# Patient Record
Sex: Female | Born: 1940
Health system: Southern US, Community
[De-identification: ages and names within clinical notes are randomized; demographics above are authoritative.]

## PROBLEM LIST (undated history)

## (undated) DIAGNOSIS — K769 Liver disease, unspecified: Secondary | ICD-10-CM

## (undated) DIAGNOSIS — K56609 Unspecified intestinal obstruction, unspecified as to partial versus complete obstruction: Secondary | ICD-10-CM

## (undated) DIAGNOSIS — T4145XA Adverse effect of unspecified anesthetic, initial encounter: Secondary | ICD-10-CM

## (undated) DIAGNOSIS — Z9889 Other specified postprocedural states: Secondary | ICD-10-CM

## (undated) DIAGNOSIS — N200 Calculus of kidney: Secondary | ICD-10-CM

## (undated) DIAGNOSIS — Z9981 Dependence on supplemental oxygen: Secondary | ICD-10-CM

## (undated) DIAGNOSIS — I10 Essential (primary) hypertension: Secondary | ICD-10-CM

## (undated) DIAGNOSIS — R002 Palpitations: Secondary | ICD-10-CM

## (undated) DIAGNOSIS — R51 Headache: Secondary | ICD-10-CM

## (undated) DIAGNOSIS — I459 Conduction disorder, unspecified: Secondary | ICD-10-CM

## (undated) DIAGNOSIS — I219 Acute myocardial infarction, unspecified: Secondary | ICD-10-CM

## (undated) DIAGNOSIS — IMO0002 Reserved for concepts with insufficient information to code with codable children: Secondary | ICD-10-CM

## (undated) DIAGNOSIS — T8859XA Other complications of anesthesia, initial encounter: Secondary | ICD-10-CM

## (undated) DIAGNOSIS — K219 Gastro-esophageal reflux disease without esophagitis: Secondary | ICD-10-CM

## (undated) DIAGNOSIS — Z8719 Personal history of other diseases of the digestive system: Secondary | ICD-10-CM

## (undated) DIAGNOSIS — J189 Pneumonia, unspecified organism: Secondary | ICD-10-CM

## (undated) DIAGNOSIS — J449 Chronic obstructive pulmonary disease, unspecified: Secondary | ICD-10-CM

## (undated) DIAGNOSIS — D126 Benign neoplasm of colon, unspecified: Secondary | ICD-10-CM

## (undated) DIAGNOSIS — Z973 Presence of spectacles and contact lenses: Secondary | ICD-10-CM

## (undated) DIAGNOSIS — M1990 Unspecified osteoarthritis, unspecified site: Secondary | ICD-10-CM

## (undated) DIAGNOSIS — R519 Headache, unspecified: Secondary | ICD-10-CM

## (undated) DIAGNOSIS — F418 Other specified anxiety disorders: Secondary | ICD-10-CM

## (undated) DIAGNOSIS — IMO0001 Reserved for inherently not codable concepts without codable children: Secondary | ICD-10-CM

## (undated) DIAGNOSIS — D649 Anemia, unspecified: Secondary | ICD-10-CM

## (undated) DIAGNOSIS — I82409 Acute embolism and thrombosis of unspecified deep veins of unspecified lower extremity: Secondary | ICD-10-CM

## (undated) DIAGNOSIS — K5790 Diverticulosis of intestine, part unspecified, without perforation or abscess without bleeding: Secondary | ICD-10-CM

## (undated) DIAGNOSIS — K279 Peptic ulcer, site unspecified, unspecified as acute or chronic, without hemorrhage or perforation: Secondary | ICD-10-CM

## (undated) DIAGNOSIS — E785 Hyperlipidemia, unspecified: Secondary | ICD-10-CM

## (undated) DIAGNOSIS — G589 Mononeuropathy, unspecified: Secondary | ICD-10-CM

## (undated) DIAGNOSIS — R112 Nausea with vomiting, unspecified: Secondary | ICD-10-CM

## (undated) HISTORY — PX: SMALL INTESTINE SURGERY: SHX150

## (undated) HISTORY — PX: MULTIPLE TOOTH EXTRACTIONS: SHX2053

## (undated) HISTORY — PX: URETHRAL DILATION: SUR417

## (undated) HISTORY — DX: Chronic obstructive pulmonary disease, unspecified: J44.9

## (undated) HISTORY — DX: Hyperlipidemia, unspecified: E78.5

## (undated) HISTORY — PX: DILATION AND CURETTAGE OF UTERUS: SHX78

## (undated) HISTORY — DX: Diverticulosis of intestine, part unspecified, without perforation or abscess without bleeding: K57.90

## (undated) HISTORY — PX: HERNIA REPAIR: SHX51

## (undated) HISTORY — PX: TUBAL LIGATION: SHX77

## (undated) HISTORY — DX: Benign neoplasm of colon, unspecified: D12.6

---

## 1985-07-23 HISTORY — PX: ABDOMINAL HYSTERECTOMY: SHX81

## 1990-07-23 HISTORY — PX: OOPHORECTOMY: SHX86

## 2010-09-14 ENCOUNTER — Encounter: Payer: Self-pay | Admitting: Family Medicine

## 2010-09-14 ENCOUNTER — Ambulatory Visit (INDEPENDENT_AMBULATORY_CARE_PROVIDER_SITE_OTHER): Payer: Medicare Other | Admitting: Family Medicine

## 2010-09-14 DIAGNOSIS — E785 Hyperlipidemia, unspecified: Secondary | ICD-10-CM | POA: Insufficient documentation

## 2010-09-14 DIAGNOSIS — G4700 Insomnia, unspecified: Secondary | ICD-10-CM | POA: Insufficient documentation

## 2010-09-14 DIAGNOSIS — I10 Essential (primary) hypertension: Secondary | ICD-10-CM

## 2010-09-14 DIAGNOSIS — R0989 Other specified symptoms and signs involving the circulatory and respiratory systems: Secondary | ICD-10-CM | POA: Insufficient documentation

## 2010-09-14 DIAGNOSIS — R002 Palpitations: Secondary | ICD-10-CM | POA: Insufficient documentation

## 2010-09-14 DIAGNOSIS — J449 Chronic obstructive pulmonary disease, unspecified: Secondary | ICD-10-CM | POA: Insufficient documentation

## 2010-09-14 DIAGNOSIS — Z136 Encounter for screening for cardiovascular disorders: Secondary | ICD-10-CM

## 2010-09-14 DIAGNOSIS — Z87891 Personal history of nicotine dependence: Secondary | ICD-10-CM | POA: Insufficient documentation

## 2010-09-15 ENCOUNTER — Telehealth: Payer: Self-pay | Admitting: Family Medicine

## 2010-09-19 NOTE — Assessment & Plan Note (Signed)
Summary: NOV;Palpitations,COPD, tobacco abuse   Vital Signs:  Patient profile:   70 year old female Height:      64.75 inches Weight:      108 pounds Pulse rate:   94 / minute BP sitting:   119 / 69  (right arm) Cuff size:   regular  Vitals Entered By: Avon Gully CMA, Duncan Dull) (September 14, 2010 1:31 PM) CC: NP-est care   CC:  NP-est care.  History of Present Illness: About  2 years ago had a severe cold and couldn't breath. Oxygen levels were in the 80s.  Had Bronchitis on CXR.  COPD.  Still smokes. She is working on Social research officer, government. Some days feels so SOB has hard time walking from one room to the next room. Also started on nighttime oxygen ( 2 liters)>   Has not been sleeping well since Oct due to stress.  Tried Advil PM and felt grogyy. Had some old lorazepam 0.5mg  she has been using occ at bedtime adn that has helped. No side effects.     she reports that she started having palpitations approximately 2 months ago.  She had had no prior history of these.  She says they last for less than a minute at the most maybe a minute.  She describes it as a fluttering sensation in her chest.  She denies any chest pain discomfort or nausea with the episodes.  She says that it is happening at least once or twice a week but not daily.  She has noticed to have been getting more frequent.  She does report a prior history of high cholesterol but is unable to tolerate statins.  She said she tried several with her prior physician.no dizziness or syncopal episodes.  Habits & Providers  Alcohol-Tobacco-Diet     Alcohol drinks/day: 0     Tobacco Status: current     Cigarette Packs/Day: <0.25     Pack years: 50  Exercise-Depression-Behavior     Does Patient Exercise: no     STD Risk: never     Drug Use: no     Seat Belt Use: always  Current Medications (verified): 1)  Lotrel 10-20 Mg Caps (Amlodipine Besy-Benazepril Hcl) .... Take One Tablet Once A Day 2)  Maxzide 75-50 Mg Tabs  (Triamterene-Hctz) 3)  Albuterol Sulfate (2.5 Mg/83ml) 0.083% Nebu (Albuterol Sulfate)  Allergies (verified): No Known Drug Allergies  Comments:  Nurse/Medical Assistant: The patient's medications and allergies were reviewed with the patient and were updated in the Medication and Allergy Lists. Avon Gully CMA, Duncan Dull) (September 14, 2010 1:34 PM)  Past History:  Past Medical History: COPD  Past Surgical History: Hysterectomy and one ovary 1987 Oophorectomy 1992  Family History: Mother with ovarian Ca, HTN  Social History: High School degree.  Married ot [employer] with 3 kids.  No longer working.  Current Smoker Alcohol use-no Drug use-no Regular exercise-no 4 caffeinated drinks per day. Smoking Status:  current Packs/Day:  <0.25 Does Patient Exercise:  no STD Risk:  never Drug Use:  no Seat Belt Use:  always  Review of Systems       No fever/sweats/weakness, + unexplained weight loss/gain.  No vison changes.  No difficulty hearing/ringing in ears, hay fever/allergies.  No chest pain/discomfort, palpitations.  No Br lump/nipple discharge.  No cough/wheeze.  No blood in BM, nausea/vomiting/diarrhea.  No nighttime urination, leaking urine, unusual vaginal bleeding, discharge (penis or vagina).  No muscle/joint pain. No rash, change in mole.  No HA, memory loss.  No anxiety, sleep d/o, depression.  No easy bruising/bleeding, unexplained lump   Physical Exam  General:  Well-developed,well-nourished,in no acute distress; alert,appropriate and cooperative throughout examination Head:  Normocephalic and atraumatic without obvious abnormalities. No apparent alopecia or balding. Eyes:  No corneal or conjunctival inflammation noted. EOMI. Perrla.  Ears:  External ear exam shows no significant lesions or deformities.  Otoscopic examination reveals clear canals, tympanic membranes are intact bilaterally without bulging, retraction, inflammation or discharge. Hearing is grossly  normal bilaterally. Nose:  External nasal examination shows no deformity or inflammation.  Mouth:  Oral mucosa and oropharynx without lesions or exudates.   Neck:  No deformities, masses, or tenderness noted. Lungs:  Normal respiratory effort, chest expands symmetrically. Lungs are clear to auscultation, no crackles or wheezes. Heart:  Normal rate and regular rhythm. S1 and S2 normal without gallop, murmur, click, rub or other extra sounds. or carotid bruit on the left.  At first I initially thought process also heard a left renal bruit the upon listening all I could hear were bowel sounds Abdomen:  Bowel sounds positive,abdomen soft and non-tender without masses, organomegaly or hernias noted. Pulses:  radial pulse 2+ plus bilaterally Extremities:  there is no 20 edema Skin:  no rashes.   Cervical Nodes:  No lymphadenopathy noted Psych:  Cognition and judgment appear intact. Alert and cooperative with normal attention span and concentration. No apparent delusions, illusions, hallucinations   Impression & Recommendations:  Problem # 1:  PALPITATIONS (ICD-785.1) EKG shows rate of 8- bpm, NSR with Abnormal ST waves.  she has a history of hypertension and uncontrolled hyperlipidemia because of reactions to statins.  She is also a smoker.  She is at high risk of ischemic heart disease.  Both of her palpitations is likely warrants a cardiac stress test and possibly an echo to evaluate for structural abnormalities.  I am concerned because she's never had palpitations until the last two months.  I will refer her to cardiology for further evaluation.she may also be a candidate for something like WelChol and she is not tried this in the past. Orders: T-Comprehensive Metabolic Panel [id number]) T-Lipid Profile [id number]) T-TSH [id number]) T-CBC No Diff ([id number]) Cardiology Referral (Cardiology) EKG w/ Interpretation (93000)  Problem # 2:  CAROTID BRUIT (ICD-785.9)  I did hear a carotid  bruit on the left.  I will schedule her for carotid ultrasound to determine the severity of stenosis.  She is certainly very high risk for peripheral vascular disease with her hypertension, lipids, smoking.  Orders: T-*Unlisted Diagnostic X-ray test/procedure (28413)  Problem # 3:  HYPERLIPIDEMIA (ICD-272.4) will recheck her lipid levels.  She may be a candidate for WelChol since she has not tolerated statins in the past. Orders: T-Comprehensive Metabolic Panel [id number]) T-Lipid Profile ([id number])  Problem # 4:  TOBACCO ABUSE (ICD-305.1) she says she is planning to work on cutting down on her cigarette smoking.  Problem # 5:  COPD (ICD-496) I also discussed with her the importance of staging her COPD D. to better recommend which medications she should be on.  We will schedule her for spirometry in the near future. Her updated medication list for this problem includes:    Albuterol Sulfate (2.5 Mg/49ml) 0.083% Nebu (Albuterol sulfate) ..... Uses three times a day scheduled.    Singulair 10 Mg Tabs (Montelukast sodium) .Marland Kitchen... Take 1 tablet by mouth once a day    Ventolin Hfa 108 (90 Base) Mcg/act Aers (Albuterol sulfate) .Marland Kitchen... 2-4 puffs inhaled eveyr 4-6 hours.  Complete  Medication List: 1)  Lotrel 10-20 Mg Caps (Amlodipine besy-benazepril hcl) .... Take one tablet once a day 2)  Maxzide 75-50 Mg Tabs (Triamterene-hctz) .... Take 1 tablet by mouth once a day 3)  Albuterol Sulfate (2.5 Mg/30ml) 0.083% Nebu (Albuterol sulfate) .... Uses three times a day scheduled. 4)  Singulair 10 Mg Tabs (Montelukast sodium) .... Take 1 tablet by mouth once a day 5)  Ventolin Hfa 108 (90 Base) Mcg/act Aers (Albuterol sulfate) .... 2-4 puffs inhaled eveyr 4-6 hours. 6)  Lorazepam 0.5 Mg Tabs (Lorazepam) .... Take 1 tablet by mouth once a day at bedtime prn.  Patient Instructions: 1)  We will call her with the cardiology referral and the appointment for her ultrasound of her neck 2)  Please go to the  lab fasting.  3)  Follow up with me in 2 weeks.  Prescriptions: LOTREL 10-20 MG CAPS (AMLODIPINE BESY-BENAZEPRIL HCL) take one tablet once a day  #0 x 0   Entered and Authorized by:   Nani Gasser MD   Signed by:   Nani Gasser MD on 09/15/2010   Method used:   Printed then faxed to ...       [facility] [phone] (retail)       [address] [address]       [place], [place]  [id number]       Ph: [phone]       Fax: [phone]   RxID:   [id number] LORAZEPAM 0.5 MG TABS (LORAZEPAM) Take 1 tablet by mouth once a day at bedtime prn.  #90 x 0   Entered and Authorized by:   Nani Gasser MD   Signed by:   Nani Gasser MD on 09/14/2010   Method used:   Printed then faxed to ...       [facility] [phone] (retail)       [address] [address]       [place], [place]  [id number]       Ph: [phone]       Fax: [phone]   RxID:   [id number]    Orders Added: 1)  T-Comprehensive Metabolic Panel [[id number]] 2)  T-Lipid Profile [[id number]] 3)  T-TSH [[id number]] 4)  T-CBC No Diff [[id number]] 5)  Cardiology Referral [Cardiology] 6)  New Patient Level IV [99204] 7)  EKG w/ Interpretation [93000] 8)  T-*Unlisted Diagnostic X-ray test/procedure [54627]  Appended Document: NOV;Palpitations,COPD, tobacco abuse Prescriptions: VENTOLIN HFA 108 (90 BASE) MCG/ACT AERS (ALBUTEROL SULFATE) 2-4 puffs inhaled eveyr 4-6 hours.  #3 x 0   Entered and Authorized by:   Nani Gasser MD   Signed by:   Nani Gasser MD on 09/15/2010   Method used:   Printed then faxed to ...       [facility] [phone] (retail)       [address] [address]       [place], [place]  [id number]       Ph: [phone]       Fax: [phone]   RxID:   [id number] SINGULAIR 10 MG TABS (MONTELUKAST SODIUM) Take 1 tablet by mouth once a day  #90 x 3   Entered and Authorized by:   Nani Gasser MD   Signed by:   Nani Gasser MD on 09/15/2010   Method used:   Printed then faxed to ...       [facility] [phone] (retail)       [address] [address]       [place], [place]  [id number]  Ph: [phone]       Fax: [phone]   RxID:   [id number] ALBUTEROL SULFATE (2.5 MG/3ML) 0.083% NEBU (ALBUTEROL SULFATE) uses three times a day scheduled.  #270 x 3   Entered and Authorized by:   Nani Gasser MD   Signed by:   Nani Gasser MD on 09/15/2010   Method used:   Printed then faxed to ...       [facility] [phone] (retail)       [address] [address]       [place], [place]  [id number]       Ph: [phone]       Fax: [phone]   RxID:   [id number] MAXZIDE 75-50 MG TABS (TRIAMTERENE-HCTZ) Take 1 tablet by mouth once a day  #90 x 1   Entered and Authorized by:   Nani Gasser MD   Signed by:   Nani Gasser MD on 09/15/2010   Method used:   Printed then faxed to ...       [facility] [phone] (retail)       [address] [address]       [place], [place]  [id number]       Ph: [phone]       Fax: [phone]   RxID:   [id number] LOTREL 10-20 MG CAPS (AMLODIPINE BESY-BENAZEPRIL HCL) take one tablet once a day  #90 x 1   Entered and Authorized by:   Nani Gasser MD   Signed by:   Nani Gasser MD on 09/15/2010   Method used:   Printed then faxed to ...       [facility] [phone] (retail)       [address] [address]       [place], [place]  [id number]       Ph: [phone]       Fax: [phone]   RxID:   [id number]

## 2010-09-23 ENCOUNTER — Encounter: Payer: Self-pay | Admitting: Family Medicine

## 2010-09-23 LAB — CONVERTED CEMR LAB
AST: 25 units/L (ref 0–37)
Albumin: 4.4 g/dL (ref 3.5–5.2)
Alkaline Phosphatase: 58 units/L (ref 39–117)
BUN: 6 mg/dL (ref 6–23)
HDL: 74 mg/dL (ref >39)
Hemoglobin: 14.6 g/dL (ref 12.0–15.0)
LDL Cholesterol: 145 mg/dL — ABNORMAL HIGH (ref 0–99)
MCHC: 33.3 g/dL (ref 30.0–36.0)
Platelets: 299 10*3/uL (ref 150–400)
Potassium: 4.8 meq/L (ref 3.5–5.3)
RDW: 13.2 % (ref 11.5–15.5)
Sodium: 141 meq/L (ref 135–145)
TSH: 1.571 microintl units/mL (ref 0.350–4.500)
Total CHOL/HDL Ratio: 3.2

## 2010-09-28 ENCOUNTER — Ambulatory Visit (INDEPENDENT_AMBULATORY_CARE_PROVIDER_SITE_OTHER): Payer: Medicare Other | Admitting: Family Medicine

## 2010-09-28 ENCOUNTER — Encounter: Payer: Self-pay | Admitting: Family Medicine

## 2010-09-28 DIAGNOSIS — J449 Chronic obstructive pulmonary disease, unspecified: Secondary | ICD-10-CM

## 2010-09-28 LAB — PULMONARY FUNCTION TEST

## 2010-09-28 NOTE — Letter (Signed)
Summary: Intake Forms  Intake Forms   Imported By: Lanelle Bal 09/18/2010 12:46:07  _____________________________________________________________________  External Attachment:    Type:   Image     Comment:   External Document

## 2010-10-01 ENCOUNTER — Encounter: Payer: Self-pay | Admitting: Family Medicine

## 2010-10-03 ENCOUNTER — Telehealth: Payer: Self-pay | Admitting: Family Medicine

## 2010-10-03 NOTE — Assessment & Plan Note (Signed)
Summary: Spirometry/COPD   Vital Signs:  Patient profile:   70 year old female Height:      64.75 inches Weight:      107 pounds O2 Sat:      94 % on Room air Pulse rate:   82 / minute BP sitting:   114 / 68  (right arm) Cuff size:   regular  Vitals Entered By: Avon Gully CMA, Duncan Dull) (September 28, 2010 1:09 PM)  O2 Flow:  Room air  Serial Vital Signs/Assessments:                                PEF    PreRx  PostRx Time      O2 Sat  O2 Type     L/min  L/min  L/min   By 1:58 PM   94  %   Room air                          Payton Spark CMA  Comments: 1:58 PM 95% on Room Air while walking 2 laps around office By: Payton Spark CMA   CC: PFT   CC:  PFT.  History of Present Illness: Here for spirometry.  She has cards appt sched for next week. Had [place] 2 days ago but I don't have report yet. She has picked up the [facility]. She is intolerant to statins  Current Medications (verified): 1)  Lotrel 10-20 Mg Caps (Amlodipine Besy-Benazepril Hcl) .... Take One Tablet Once A Day 2)  Maxzide-25 37.5-25 Mg Tabs (Triamterene-Hctz) .... Take 1 Tablet By Mouth Once A Day 3)  Albuterol Sulfate (2.5 Mg/21ml) 0.083% Nebu (Albuterol Sulfate) .... Uses Three Times A Day Scheduled. 4)  Singulair 10 Mg Tabs (Montelukast Sodium) .... Take 1 Tablet By Mouth Once A Day 5)  Ventolin Hfa 108 (90 Base) Mcg/act Aers (Albuterol Sulfate) .... 2-4 Puffs Inhaled Eveyr 4-6 Hours. 6)  Lorazepam 0.5 Mg Tabs (Lorazepam) .... Take 1 Tablet By Mouth Once A Day At Bedtime Prn. 7)  Welchol 3.75 Gm Pack (Colesevelam Hcl) .... Mix With [employer] and Drink One Pouch Once A Day  Allergies (verified): No Known Drug Allergies  Comments:  Nurse/Medical Assistant: The patient's medications and allergies were reviewed with the patient and were updated in the Medication and Allergy Lists. Avon Gully CMA, Duncan Dull) (September 28, 2010 1:09 PM)  Past History:  Past Medical History: COPD: FVC 72%, FEV1 29%,  FEV1 ratio 32% (very severe COPD).   Physical Exam  General:  Well-developed,well-nourished,in no acute distress; alert,appropriate and cooperative throughout examination   Impression & Recommendations:  Problem # 1:  COPD (ICD-496) WE discussed her dx and the need to quit smoking. She is continueing to work on weaning down.  We also discussed medications basedon her stage. Will start Spiriva and QVAR. F/U in one months to rechech how she is doing. She does wear oxygen at night. She is 94 on room air and didn't drip with ambulation.  Demonstrated how to use the spiriva.  Her updated medication list for this problem includes:    Albuterol Sulfate (2.5 Mg/9ml) 0.083% Nebu (Albuterol sulfate) ..... Uses three times a day scheduled.    Singulair 10 Mg Tabs (Montelukast sodium) .Marland Kitchen... Take 1 tablet by mouth once a day    Ventolin Hfa 108 (90 Base) Mcg/act Aers (Albuterol sulfate) .Marland Kitchen... 2-4 puffs inhaled eveyr 4-6 hours.  Spiriva Handihaler 18 Mcg Caps (Tiotropium bromide monohydrate) .Marland Kitchen... 1 capsule inhaled daily    Qvar 80 Mcg/act Aers (Beclomethasone dipropionate) .Marland Kitchen... 2 puffs inhaled two times a day  Orders: Albuterol Sulfate Sol 1mg  unit dose (G9562) Spirometry (Pre & Post) (94060)  Complete Medication List: 1)  Lotrel 10-20 Mg Caps (Amlodipine besy-benazepril hcl) .... Take one tablet once a day 2)  Maxzide-25 37.5-25 Mg Tabs (Triamterene-hctz) .... Take 1 tablet by mouth once a day 3)  Albuterol Sulfate (2.5 Mg/72ml) 0.083% Nebu (Albuterol sulfate) .... Uses three times a day scheduled. 4)  Singulair 10 Mg Tabs (Montelukast sodium) .... Take 1 tablet by mouth once a day 5)  Ventolin Hfa 108 (90 Base) Mcg/act Aers (Albuterol sulfate) .... 2-4 puffs inhaled eveyr 4-6 hours. 6)  Lorazepam 0.5 Mg Tabs (Lorazepam) .... Take 1 tablet by mouth once a day at bedtime prn. 7)  Welchol 3.75 Gm Pack (Colesevelam hcl) .... Mix with glasse of water and drink one pouch once a day 8)  Spiriva  Handihaler 18 Mcg Caps (Tiotropium bromide monohydrate) .Marland Kitchen.. 1 capsule inhaled daily 9)  Qvar 80 Mcg/act Aers (Beclomethasone dipropionate) .... 2 puffs inhaled two times a day  Patient Instructions: 1)  Please schedule a follow-up appointment in 1 month for COPD.  Prescriptions: QVAR 80 MCG/ACT AERS (BECLOMETHASONE DIPROPIONATE) 2 puffs inhaled two times a day  #1 x 0   Entered and Authorized by:   Nani Gasser MD   Signed by:   Nani Gasser MD on 09/28/2010   Method used:   Electronically to        [facility] (retail)       [address]       [place], [place]  [address]       Ph: [phone]       Fax: [phone]   RxID:   [id number] SPIRIVA HANDIHALER 18 MCG CAPS (TIOTROPIUM BROMIDE MONOHYDRATE) 1 capsule inhaled daily  #30 x 0   Entered and Authorized by:   Nani Gasser MD   Signed by:   Nani Gasser MD on 09/28/2010   Method used:   Electronically to        [facility] (retail)       [address]       [place], [place]  [address]       Ph: [phone]       Fax: [phone]   RxID:   [id number]    Medication Administration  Medication # 1:    Medication: Albuterol Sulfate Sol 1mg  unit dose    Diagnosis: COPD (ICD-496)    Dose: 2.5    Route: inhaled    Patient tolerated medication without complications    Given by: Avon Gully CMA, Duncan Dull) (September 28, 2010 1:10 PM)  Orders Added: 1)  Albuterol Sulfate Sol 1mg  unit dose [G2542] 2)  Spirometry (Pre & Post) [94060] 3)  Est. Patient Level III [70623]

## 2010-10-04 ENCOUNTER — Encounter: Payer: Self-pay | Admitting: Cardiology

## 2010-10-04 ENCOUNTER — Ambulatory Visit (INDEPENDENT_AMBULATORY_CARE_PROVIDER_SITE_OTHER): Payer: Medicare Other | Admitting: Cardiology

## 2010-10-04 DIAGNOSIS — R002 Palpitations: Secondary | ICD-10-CM

## 2010-10-04 DIAGNOSIS — R072 Precordial pain: Secondary | ICD-10-CM

## 2010-10-09 ENCOUNTER — Other Ambulatory Visit: Payer: Self-pay | Admitting: Cardiology

## 2010-10-10 NOTE — Assessment & Plan Note (Signed)
Summary: PALPITATIONS & HIGH RISK FOR ISCHEMIC DZ/HTN/KH   Visit Type:  Initial Consult  CC:  palpitations.  History of Present Illness: 70 year old female for evaluation of palpitations. No prior cardiac history. Patient states that over the past 2 months she has had occasional palpitations. They are described as her heart racing for 10-15 seconds. It resolved spontaneously. There is associated dyspnea but no chest pain. They're not related to activities. She does have chronic dyspnea on exertion attributed to COPD. There is no orthopnea, PND or pedal edema. She occasionally feels chest pressure. It is substernal without radiation. There is no associated symptoms. It is not pleuritic or exertional. It resolved spontaneously. Because of the above we were asked to further evaluate.  Current Medications (verified): 1)  Lotrel 10-20 Mg Caps (Amlodipine Besy-Benazepril Hcl) .... Take One Tablet Once A Day 2)  Maxzide-25 37.5-25 Mg Tabs (Triamterene-Hctz) .... Take 1 Tablet By Mouth Once A Day 3)  Albuterol Sulfate (2.5 Mg/57ml) 0.083% Nebu (Albuterol Sulfate) .... Uses Three Times A Day Scheduled. 4)  Singulair 10 Mg Tabs (Montelukast Sodium) .... Take 1 Tablet By Mouth Once A Day 5)  Ventolin Hfa 108 (90 Base) Mcg/act Aers (Albuterol Sulfate) .... 2-4 Puffs Inhaled Eveyr 4-6 Hours. 6)  Lorazepam 0.5 Mg Tabs (Lorazepam) .... Take 1 Tablet By Mouth Once A Day At Bedtime Prn. 7)  Welchol 3.75 Gm Pack (Colesevelam Hcl) .... Mix With [employer] and Drink One Pouch Once A Day 8)  Spiriva Handihaler 18 Mcg Caps (Tiotropium Bromide Monohydrate) .Marland Kitchen.. 1 Capsule Inhaled Daily 9)  Qvar 80 Mcg/act Aers (Beclomethasone Dipropionate) .... 2 Puffs Inhaled Two Times A Day  Allergies (verified): No Known Drug Allergies  Past History:  Past Medical History: COPD: FVC 72%, FEV1 29%, FEV1 ratio 32% (very severe COPD).  Hypertension Hyperlipidemia  Past Surgical History: Hysterectomy and one ovary  1987 Oophorectomy 1992  Family History: Reviewed history from 09/14/2010 and no changes required. Mother with ovarian Ca, HTN Father with CABG in his late 51's  Social History: Reviewed history from 09/14/2010 and no changes required. High School degree.  Married ot [employer] with 3 kids.  No longer working.  Current Smoker Alcohol use-no Drug use-no Regular exercise-no 4 caffeinated drinks per day.   Review of Systems       Some arthralgias but no fevers or chills, productive cough, hemoptysis, dysphasia, odynophagia, melena, hematochezia, dysuria, hematuria, rash, seizure activity, orthopnea, PND, pedal edema, claudication. Remaining systems are negative.   Vital Signs:  Patient profile:   70 year old female Height:      64.75 inches Weight:      107.75 pounds BMI:     18.13 Pulse rate:   84 / minute Pulse rhythm:   regular Resp:     18 per minute BP sitting:   109 / 57  (left arm) Cuff size:   small  Vitals Entered By: Vikki Ports (October 04, 2010 1:21 PM)  Physical Exam  General:  Well developed/well nourished in NAD Skin warm/dry Patient not depressed No peripheral clubbing Back-normal HEENT-normal/normal eyelids Neck supple/normal carotid upstroke bilaterally; no bruits; no JVD; no thyromegaly chest - diminished breath sounds throughout CV - RRR/normal S1 and S2; no murmurs, rubs or gallops;  PMI nondisplaced Abdomen -NT/ND, no HSM, no mass, + bowel sounds, positive bruit 2+ femoral pulses, no bruits Ext-no edema, chords, 2+ DP Neuro-grossly nonfocal     EKG  Procedure date:  09/28/2010  Findings:  Normal sinus rhythm, nonspecific ST changes, right axis deviation, right atrial enlargement.  Impression & Recommendations:  Problem # 1:  ABDOMINAL BRUIT (ICD-785.9) Plan abdominal ultrasound to exclude aneurysm. Orders: Abdominal Aorta Duplex (Abd Aorta Duplex)  Problem # 2:  CHEST PAIN, PRECORDIAL (ICD-786.51) Symptoms may be secondary  to COPD. However she does have risk factors. Schedule Myoview for risk stratification. Her updated medication list for this problem includes:    Lotrel 10-20 Mg Caps (Amlodipine besy-benazepril hcl) .Marland Kitchen... Take one tablet once a day  Orders: Nuclear Stress Test (Nuc Stress Test)  Problem # 3:  PALPITATIONS (ICD-785.1) Schedule CardioNet. Her updated medication list for this problem includes:    Lotrel 10-20 Mg Caps (Amlodipine besy-benazepril hcl) .Marland Kitchen... Take one tablet once a day  Orders: Event (Event)  Problem # 4:  TOBACCO ABUSE (ICD-305.1) Patient counseled on discontinuing.  Problem # 5:  COPD (ICD-496)  Her updated medication list for this problem includes:    Albuterol Sulfate (2.5 Mg/68ml) 0.083% Nebu (Albuterol sulfate) ..... Uses three times a day scheduled.    Singulair 10 Mg Tabs (Montelukast sodium) .Marland Kitchen... Take 1 tablet by mouth once a day    Ventolin Hfa 108 (90 Base) Mcg/act Aers (Albuterol sulfate) .Marland Kitchen... 2-4 puffs inhaled eveyr 4-6 hours.    Spiriva Handihaler 18 Mcg Caps (Tiotropium bromide monohydrate) .Marland Kitchen... 1 capsule inhaled daily    Qvar 80 Mcg/act Aers (Beclomethasone dipropionate) .Marland Kitchen... 2 puffs inhaled two times a day  Problem # 6:  HYPERTENSION, BENIGN (ICD-401.1) Blood pressure controlled. Continue present medications. Her updated medication list for this problem includes:    Lotrel 10-20 Mg Caps (Amlodipine besy-benazepril hcl) .Marland Kitchen... Take one tablet once a day    Maxzide-25 37.5-25 Mg Tabs (Triamterene-hctz) .Marland Kitchen... Take 1 tablet by mouth once a day  Problem # 7:  HYPERLIPIDEMIA (ICD-272.4) Continue meds. Managed by primary care. Her updated medication list for this problem includes:    Welchol 3.75 Gm Pack (Colesevelam hcl) ..... Mix with glasse of water and drink one pouch once a day  Patient Instructions: 1)  Your physician recommends that you schedule a follow-up appointment in:  2)  Your physician has requested that you have an abdominal aorta duplex.  During this test, an ultrasound is used to evaluate the aorta. Allow 30 minutes for this exam. Do not eat after midnight the day before and avoid carbonated beverages. There are no restrictions or special instructions. 3)  Your physician has recommended that you wear an event monitor.  Event monitors are medical devices that record the heart's electrical activity. Doctors most often use these monitors to diagnose arrhythmias. Arrhythmias are problems with the speed or rhythm of the heartbeat. The monitor is a small, portable device. You can wear one while you do your normal daily activities. This is usually used to diagnose what is causing palpitations/syncope (passing out). 4)  Your physician has requested that you have an LEXISCAN stress myoview.  For further information please visit [email or web address]  Please follow instruction sheet, as given.

## 2010-10-10 NOTE — Progress Notes (Signed)
Summary: [facility]-Med correction  Phone Note Call from Patient Call back at Home Phone [phone]   Caller: Patient Summary of Call: Pt called to clarify dose of Maxide.  Pt states bottle says "maxzide 25".  Verified with pharmacist at [facility] in [place] that Maxzide should be 37.5/25.  This will be corrected on med-list and with [facility].  Does this dose change make a difference to your previous medication plan or can I correct this with pharm?  Please advise. Initial call taken by: Francee Piccolo CMA Duncan Dull),  September 15, 2010 10:01 AM  Follow-up for Phone Call        No, can you just correct with [facility]. Pt wasn't sure and she was supposed to call us back yesterday adn I never heard from her so I just sent it today. Thank you for working on htis.  Follow-up by: Nani Gasser MD,  September 15, 2010 10:05 AM  Additional Follow-up for Phone Call Additional follow up Details #1::        Have we called [facility]? Additional Follow-up by: Nani Gasser MD,  September 18, 2010 4:25 PM    Additional Follow-up for Phone Call Additional follow up Details #2::    Sue Lush, can you call and make sure has been correcrted with [facility].  Follow-up by: Nani Gasser MD,  October 03, 2010 12:49 PM  Additional Follow-up for Phone Call Additional follow up Details #3:: Details for Additional Follow-up Action Taken: called [facility] and they had already shipped out an order with the worng dose. Spoke with pharmacist and gave new dose of 37.5/25 and they will correct this and send new order Additional Follow-up by: Avon Gully CMA, Duncan Dull),  October 04, 2010 11:30 AM  New/Updated Medications: MAXZIDE-25 37.5-25 MG TABS (TRIAMTERENE-HCTZ) Take 1 tablet by mouth once a day

## 2010-10-10 NOTE — Progress Notes (Addendum)
Summary: Carotid US results.   Phone Note Outgoing Call   Summary of Call: Call pt:  caroitd [place] results.  Mild to moderate plaque in teh arteries. It is less than 50% to no surgery needed. Does need to control cholesterol and BP so this doesn't progress.  Initial call taken by: Nani Gasser MD,  October 03, 2010 12:43 PM  Follow-up for Phone Call        called and left message to call back Follow-up by: Avon Gully CMA, Duncan Dull),  October 03, 2010 1:51 PM  Additional Follow-up for Phone Call Additional follow up Details #1::        Given results.  Additional Follow-up by: Nani Gasser MD,  October 05, 2010 2:45 PM

## 2010-10-11 ENCOUNTER — Encounter: Payer: Self-pay | Admitting: Family Medicine

## 2010-10-16 ENCOUNTER — Other Ambulatory Visit: Payer: Self-pay | Admitting: *Deleted

## 2010-10-16 DIAGNOSIS — J449 Chronic obstructive pulmonary disease, unspecified: Secondary | ICD-10-CM

## 2010-10-16 DIAGNOSIS — E785 Hyperlipidemia, unspecified: Secondary | ICD-10-CM

## 2010-10-16 MED ORDER — TIOTROPIUM BROMIDE MONOHYDRATE 18 MCG IN CAPS: 18.0000 ug | ORAL_CAPSULE | Freq: Every day | RESPIRATORY_TRACT | Status: DC

## 2010-10-16 MED ORDER — COLESEVELAM HCL 3.75 G PO PACK: 1.0000 | PACK | Freq: Every day | ORAL | Status: DC

## 2010-10-16 MED ORDER — BECLOMETHASONE DIPROPIONATE 80 MCG/ACT IN AERS: 2.0000 | INHALATION_SPRAY | Freq: Two times a day (BID) | RESPIRATORY_TRACT | Status: DC

## 2010-10-17 ENCOUNTER — Other Ambulatory Visit (HOSPITAL_COMMUNITY): Payer: Medicare Other

## 2010-10-18 ENCOUNTER — Other Ambulatory Visit (HOSPITAL_COMMUNITY): Payer: Medicare Other

## 2010-10-18 ENCOUNTER — Other Ambulatory Visit: Payer: Self-pay | Admitting: Cardiology

## 2010-10-18 ENCOUNTER — Encounter (INDEPENDENT_AMBULATORY_CARE_PROVIDER_SITE_OTHER): Payer: Medicare Other | Admitting: Cardiology

## 2010-10-18 ENCOUNTER — Ambulatory Visit (HOSPITAL_COMMUNITY): Payer: Medicare Other | Attending: Cardiology | Admitting: Radiology

## 2010-10-18 ENCOUNTER — Encounter (INDEPENDENT_AMBULATORY_CARE_PROVIDER_SITE_OTHER): Payer: Medicare Other

## 2010-10-18 DIAGNOSIS — R0602 Shortness of breath: Secondary | ICD-10-CM

## 2010-10-18 DIAGNOSIS — R079 Chest pain, unspecified: Secondary | ICD-10-CM | POA: Insufficient documentation

## 2010-10-18 DIAGNOSIS — R002 Palpitations: Secondary | ICD-10-CM

## 2010-10-18 DIAGNOSIS — R0989 Other specified symptoms and signs involving the circulatory and respiratory systems: Secondary | ICD-10-CM

## 2010-10-18 DIAGNOSIS — I700 Atherosclerosis of aorta: Secondary | ICD-10-CM

## 2010-10-18 DIAGNOSIS — R0789 Other chest pain: Secondary | ICD-10-CM

## 2010-10-18 MED ORDER — REGADENOSON 0.4 MG/5ML IV SOLN
0.4000 mg | Freq: Once | INTRAVENOUS | Status: AC
  Administered 2010-10-18: 0.4 mg via INTRAVENOUS

## 2010-10-18 MED ORDER — TECHNETIUM TC 99M TETROFOSMIN IV KIT
33.0000 | PACK | Freq: Once | INTRAVENOUS | Status: AC | PRN
  Administered 2010-10-18: 33 via INTRAVENOUS

## 2010-10-18 MED ORDER — TECHNETIUM TC 99M TETROFOSMIN IV KIT
11.0000 | PACK | Freq: Once | INTRAVENOUS | Status: AC | PRN
  Administered 2010-10-18: 11 via INTRAVENOUS

## 2010-10-18 NOTE — Progress Notes (Signed)
[facility] SITE 3 NUCLEAR MED [address] [place] [address] [phone]  Cardiology Nuclear Med Study Tamara Adams female 1941/02/16   Nuclear Med Background Indication for Stress Test:  Evaluation for Ischemia History:  COPD Cardiac Risk Factors: Carotid Disease, Family History - CAD, Hypertension, Lipids and Smoker  Symptoms:  Chest Pressure/tightness.  (last date of chest discomfort 2-3 weeks ago, per patient), Dizziness, DOE, Palpitations, Rapid HR and SOB   Nuclear Pre-Procedure Caffeine/Decaff Intake:  7:00pm NPO After: 7:00pm   Lungs:  Clear BBS IV 0.9% NS with Angio Cath:  18g  IV Site: R Antecubital  IV Started by:  Stanton Kidney, EMT-P  Chest Size (in):  36 Cup Size: C  Height: 5\' 5"  (1.651 m)  Weight:  106 lb (48.081 kg)  BMI:  Body mass index is 17.64 kg/(m^2). Tech Comments:  NA    Nuclear Med Study 1 or 2 day study: 1 day  Stress Test Type:  Treadmill/Lexiscan  Reading MD: Cassell Clement, MD  Order Authorizing Provider:  Olga Millers, MD  Resting Radionuclide: Technetium 69m Tetrofosmin  Resting Radionuclide Dose: 11 mCi   Stress Radionuclide:  Technetium 72m Tetrofosmin  Stress Radionuclide Dose: 33 mCi           Stress Protocol Rest HR: 72 Stress HR: 125  Rest BP: 110/58 Stress BP: 149/58  Exercise Time:  2 min METS: 1.60  Predicted HR: 151 % of Maximum: 82.78    Predicted Max HR: 151 bpm % Max HR: 82.78 bpm Rate Pressure Product: 91478    Dose of Adenosine:  N/A mg Dose of Lexiscan:  0.4 mg  Dose of Atropine:  N/A mg Dose of Dobutamine:  N/A mcg/kg/min (at max HR)  Stress Test Technologist: Stanton Kidney, EMT-P  Nuclear Technologist:  Doyne Keel, CNMT     Rest Procedure:  Myocardial perfusion imaging was performed at rest 45 minutes following the intravenous administration of Technetium 52m Tetrofosmin. Rest ECG: NSR  Stress Procedure:  The patient received IV Lexiscan 0.4 mg over 15-seconds with concurrent low  level exercise and then Technetium 5m Tetrofosmin was injected at 30-seconds while the patient continued walking one more minute.  There were no significant changes with Lexiscan.  Quantitative spect images were obtained after a 45-minute delay. Stress ECG: No significant change from baseline ECG  QPS Raw Data Images:  Normal; no motion artifact; normal heart/lung ratio. Stress Images:  Normal homogeneous uptake in all areas of the myocardium. Rest Images:  Normal homogeneous uptake in all areas of the myocardium. Subtraction (SDS):  No evidence of ischemia.  Findings Risk Category:  Normal nuclear study. Clinically Abnormal:  No Ischemia:  No Fixed Defect:  No LV Dysfunction:  No Transient Ischemic Dilatation (Normal <1.22): .98 Lung/Heart Ratio (Normal <0.45):  .22  Quantitative Gated Spect Images QGS EDV:  42 ml QGS ESV:  7 ml QGS cine images:  Vigorous wall motion. QGS EF:  84 %  Impression Exercise Capacity:  Lexiscan with low level exercise. BP Response:  Normal blood pressure response. Clinical Symptoms:  No chest pain. ECG Impression:  No significant ST segment change suggestive of ischemia. Comparison with Prior Nuclear Study: No previous nuclear study performed  Overall Impression:  Normal stress nuclear study.

## 2010-10-19 ENCOUNTER — Telehealth: Payer: Self-pay | Admitting: *Deleted

## 2010-10-19 NOTE — Patient Instructions (Signed)
Pt aware of myoview results 

## 2010-10-19 NOTE — Telephone Encounter (Signed)
pt aware of myoview results  

## 2010-10-20 ENCOUNTER — Encounter: Payer: Self-pay | Admitting: Family Medicine

## 2010-10-23 ENCOUNTER — Encounter: Payer: Self-pay | Admitting: Family Medicine

## 2010-10-26 ENCOUNTER — Encounter: Payer: Self-pay | Admitting: Cardiology

## 2010-10-30 ENCOUNTER — Ambulatory Visit (INDEPENDENT_AMBULATORY_CARE_PROVIDER_SITE_OTHER): Payer: Medicare Other | Admitting: Family Medicine

## 2010-10-30 DIAGNOSIS — F17200 Nicotine dependence, unspecified, uncomplicated: Secondary | ICD-10-CM

## 2010-10-30 DIAGNOSIS — R0902 Hypoxemia: Secondary | ICD-10-CM

## 2010-10-30 DIAGNOSIS — I10 Essential (primary) hypertension: Secondary | ICD-10-CM

## 2010-10-30 DIAGNOSIS — J449 Chronic obstructive pulmonary disease, unspecified: Secondary | ICD-10-CM

## 2010-10-30 NOTE — Assessment & Plan Note (Signed)
At goal today

## 2010-10-30 NOTE — Progress Notes (Signed)
  Subjective:    Patient ID: Tamara Adams, female    DOB: [birth year], 70 y.o.   MRN: [id number]  HPI Doing well on the spiriva. She feels so much better on this.  Feels she can do more on this.  Does have occassional HA, but mild.  Would like to stop the singulair.  She doesn' thave allergies.    Review of Systems     Objective:   Physical Exam  Constitutional: She appears well-developed and well-nourished.  HENT:  Head: Normocephalic.  Cardiovascular: Normal rate, regular rhythm and normal heart sounds.   Pulmonary/Chest: Effort normal and breath sounds normal.  Skin: Skin is warm and dry.          Assessment & Plan:

## 2010-10-30 NOTE — Assessment & Plan Note (Addendum)
She overall feels much better and notices she is able to get thought her housework much more easily. She is doing great. No SE of the medication. F/U in 6 months. Stop the singulair.

## 2010-10-30 NOTE — Assessment & Plan Note (Addendum)
She has been using elecrtonic cigarettes.  She has been working on the habit.  She is on the low dose  Cartridges.   Continue to encourage her.

## 2010-11-10 ENCOUNTER — Telehealth: Payer: Self-pay | Admitting: *Deleted

## 2010-11-10 NOTE — Telephone Encounter (Signed)
Manuella Ghazi from [facility] called and states they will not be able to start care until Monday

## 2010-11-14 ENCOUNTER — Encounter: Payer: Self-pay | Admitting: Cardiology

## 2010-11-15 ENCOUNTER — Ambulatory Visit (INDEPENDENT_AMBULATORY_CARE_PROVIDER_SITE_OTHER): Payer: Medicare Other | Admitting: Cardiology

## 2010-11-15 ENCOUNTER — Encounter: Payer: Self-pay | Admitting: Cardiology

## 2010-11-15 DIAGNOSIS — E785 Hyperlipidemia, unspecified: Secondary | ICD-10-CM

## 2010-11-15 DIAGNOSIS — R002 Palpitations: Secondary | ICD-10-CM

## 2010-11-15 DIAGNOSIS — R072 Precordial pain: Secondary | ICD-10-CM

## 2010-11-15 DIAGNOSIS — F17200 Nicotine dependence, unspecified, uncomplicated: Secondary | ICD-10-CM

## 2010-11-15 DIAGNOSIS — I10 Essential (primary) hypertension: Secondary | ICD-10-CM

## 2010-11-15 NOTE — Progress Notes (Signed)
HPI:70 year old female I saw in March of 2012 for evaluation of palpitations. CardioNet showed sinus rhythm. Abdominal ultrasound performed in March of 2012 secondary to a bruit showed calcification but no aneurysm. Myoview in March of 2012 showed an ejection fraction of 84% and no ischemia. Since I saw her previously, the patient has dyspnea with more extreme activities but not with routine activities. It is relieved with rest. It is not associated with chest pain. There is no orthopnea, PND or pedal edema. There is no syncope. There is no exertional chest pain. Her palpitations have improved somewhat. She did state that she had palpitations while the monitor was in place.   Current Outpatient Prescriptions  Medication Sig Dispense Refill  . albuterol (PROVENTIL) (2.5 MG/3ML) 0.083% nebulizer solution Take 2.5 mg by nebulization 3 (three) times daily.        Marland Kitchen albuterol (VENTOLIN HFA) 108 (90 BASE) MCG/ACT inhaler Inhale 2 puffs into the lungs every 6 (six) hours as needed. 2-4 puffs inhaled every 4-6 hours       . amLODipine-benazepril (LOTREL) 10-20 MG per capsule Take 1 capsule by mouth daily.        . beclomethasone (QVAR) 80 MCG/ACT inhaler Inhale 2 puffs into the lungs 2 (two) times daily.  3 Inhaler  1  . Colesevelam HCl [facility]) 3.75 G PACK Take 1 each by mouth daily. Mix with glass of water and drink one pouch once a day  90 each  1  . LORazepam (ATIVAN) 0.5 MG tablet Take 0.5 mg by mouth as needed.       . tiotropium (SPIRIVA HANDIHALER) 18 MCG inhalation capsule Place 1 capsule (18 mcg total) into inhaler and inhale daily.  90 capsule  1  . triamterene-hydrochlorothiazide (MAXZIDE-25) 37.5-25 MG per tablet Take 1 tablet by mouth daily.           Past Medical History  Diagnosis Date  . COPD (chronic obstructive pulmonary disease)     FVC 72%, FEV1 29%, FEV1 ratio 32% (very severe (COPD)    Past Surgical History  Procedure Date  . Abdominal hysterectomy 1987    and one ovary  .  Oophorectomy 1992    History   Social History  . Marital Status: Married    Spouse Name: N/A    Number of Children: N/A  . Years of Education: N/A   Occupational History  . Not on file.   Social History Main Topics  . Smoking status: Current Everyday Smoker  . Smokeless tobacco: Not on file  . Alcohol Use: No  . Drug Use: No  . Sexually Active:    Other Topics Concern  . Not on file   Social History Narrative  . No narrative on file    ROS: no fevers or chills, productive cough, hemoptysis, dysphasia, odynophagia, melena, hematochezia, dysuria, hematuria, rash, seizure activity, orthopnea, PND, pedal edema, claudication. Remaining systems are negative.  Physical Exam: Well-developed well-nourished in no acute distress.  Skin is warm and dry.  HEENT is normal.  Neck is supple. No thyromegaly.  Chest is diminished breath sounds throughout with mild expiratory wheeze. Cardiovascular exam is regular rate and rhythm.  Abdominal exam nontender or distended. No masses palpated. Extremities show no edema. neuro grossly intact

## 2010-11-15 NOTE — Assessment & Plan Note (Signed)
CardioNet monitor negative. Symptoms have improved. Note she did state that she had palpitations with a monitor in place. Sinus rhythm demonstrated with Cardionet. No further evaluation.

## 2010-11-15 NOTE — Assessment & Plan Note (Signed)
Patient is working on discontinuing.

## 2010-11-15 NOTE — Assessment & Plan Note (Signed)
Blood pressure controlled with medications. Will continue.

## 2010-11-15 NOTE — Assessment & Plan Note (Signed)
Management per primary care. 

## 2010-11-15 NOTE — Assessment & Plan Note (Signed)
Symptoms have resolved. Myoview negative. No further evaluation.

## 2010-12-06 ENCOUNTER — Telehealth: Payer: Self-pay | Admitting: Family Medicine

## 2010-12-06 NOTE — Telephone Encounter (Signed)
Call pt: Overnight oxymetry was normal. Didn't meet the medicare guidelines for overnight oxygen.

## 2010-12-06 NOTE — Telephone Encounter (Signed)
Pt.notified

## 2010-12-06 NOTE — Telephone Encounter (Signed)
Pt called and said we need to call [employer] to discontinue her oxygen so they can pick up.  Please advise if this should be done??? Plan:  Routed to Dr. Marlyne Beards, LPN Domingo Dimes

## 2010-12-07 ENCOUNTER — Telehealth: Payer: Self-pay | Admitting: Family Medicine

## 2010-12-07 NOTE — Telephone Encounter (Signed)
Notified pt but she was not available.  Had to speak with the husband.  Told him that returning pt call.  Told him to have her call traiage nurse back so questions could be asked of her prior to cancelling O2 with [employer].  Voiced understanding. Jarvis Newcomer, LPN Domingo Dimes

## 2010-12-07 NOTE — Telephone Encounter (Signed)
Can you clarify with the pt is fhe was wearing oxygen during teh study or not?

## 2010-12-08 ENCOUNTER — Telehealth: Payer: Self-pay | Admitting: Family Medicine

## 2010-12-08 NOTE — Telephone Encounter (Signed)
Let call and give a verbal on this. Then they can fax over form for me to sign.

## 2010-12-08 NOTE — Telephone Encounter (Signed)
This is regarding th eprevisou phone note. I thin andrea actually called her.

## 2010-12-08 NOTE — Telephone Encounter (Addendum)
Pt needs a discharge letter  from PCP since she does have a machine for the overnight oxygen   So that [facility] can come get the machine. Pt was not  On oxygen at the time of the overnight oxygen test

## 2010-12-08 NOTE — Telephone Encounter (Signed)
Pt is returning a call to the provider who tried to reach her earlier.  Please call the patient back @ [phone]. Plan:  Routed to Dr. Marlyne Beards, LPN Domingo Dimes

## 2010-12-21 NOTE — Telephone Encounter (Signed)
Don't know why this encounter is showing in my in box, therefore, will make this note then close. Jarvis Newcomer, LPN Domingo Dimes

## 2010-12-25 ENCOUNTER — Telehealth: Payer: Self-pay | Admitting: Family Medicine

## 2010-12-25 NOTE — Telephone Encounter (Signed)
Pt called and still concerned about her o2 and being charged for it.  Has had the o2 setup through [facility] for 3 years due to severe COPD.  Pt had recent o2 overnight oximetry.  Her lowest level was 86% which kept her qualified for continuation of the home O2.  Pt wanted to keep the O2 but was concerned because she could not afford out of pocket.   Plan:  Pt notified that she does not have to worry that her recent o2 level 86% kept her qualified and she may keep the O2 tank and insurance will cover.  Pt was glad this had been straightened out. Routed to Dr. Linford Arnold as Lorain Childes message only. Jarvis Newcomer, LPN Domingo Dimes

## 2011-01-01 ENCOUNTER — Encounter: Payer: Self-pay | Admitting: Family Medicine

## 2011-02-21 ENCOUNTER — Other Ambulatory Visit: Payer: Self-pay | Admitting: *Deleted

## 2011-02-21 MED ORDER — ZOSTER VACCINE LIVE 19400 UNT/0.65ML ~~LOC~~ SOLR: 0.6500 mL | Freq: Once | SUBCUTANEOUS | Status: DC

## 2011-02-28 ENCOUNTER — Other Ambulatory Visit: Payer: Self-pay | Admitting: Family Medicine

## 2011-04-03 ENCOUNTER — Other Ambulatory Visit: Payer: Self-pay | Admitting: Family Medicine

## 2011-06-01 ENCOUNTER — Telehealth: Payer: Self-pay | Admitting: *Deleted

## 2011-06-01 MED ORDER — LORAZEPAM 0.5 MG PO TABS: 0.5000 mg | ORAL_TABLET | ORAL | Status: DC | PRN

## 2011-06-01 NOTE — Telephone Encounter (Signed)
We have never filled this for her before. We need to have a refill request or she can bring in her bottle so we can verify dosing schedule and quantity.

## 2011-06-01 NOTE — Telephone Encounter (Signed)
Pt would like a refill on Lorazepam sent to [facility] 90 day supply. I can't print this

## 2011-06-01 NOTE — Telephone Encounter (Signed)
You have refilled this before it was on 09/02/10 in the old system. Its Lorazepam 0.5mg  one tablet by mouth at bedtime as needed

## 2011-06-11 ENCOUNTER — Other Ambulatory Visit: Payer: Self-pay | Admitting: Family Medicine

## 2011-06-11 MED ORDER — LORAZEPAM 0.5 MG PO TABS: 0.5000 mg | ORAL_TABLET | Freq: Every day | ORAL | Status: DC | PRN

## 2011-07-09 ENCOUNTER — Ambulatory Visit (INDEPENDENT_AMBULATORY_CARE_PROVIDER_SITE_OTHER): Payer: Medicare Other | Admitting: Family Medicine

## 2011-07-09 ENCOUNTER — Encounter: Payer: Self-pay | Admitting: Family Medicine

## 2011-07-09 ENCOUNTER — Other Ambulatory Visit: Payer: Self-pay | Admitting: Family Medicine

## 2011-07-09 VITALS — BP 101/58 | HR 117 | Wt 115.0 lb

## 2011-07-09 DIAGNOSIS — J40 Bronchitis, not specified as acute or chronic: Secondary | ICD-10-CM

## 2011-07-09 DIAGNOSIS — J441 Chronic obstructive pulmonary disease with (acute) exacerbation: Secondary | ICD-10-CM

## 2011-07-09 MED ORDER — AZITHROMYCIN 250 MG PO TABS: ORAL_TABLET | ORAL | Status: AC

## 2011-07-09 MED ORDER — PREDNISONE 20 MG PO TABS: ORAL_TABLET | ORAL | Status: DC

## 2011-07-09 NOTE — Progress Notes (Signed)
  Subjective:    Patient ID: Tamara Adams, female    DOB: 1941/07/23, 70 y.o.   MRN: [id number]  HPI COPD- Started getting more SOB about 3 weeks ago.  Has been using her inhalers regularly. No fever, chills or URI sxs. Inc SOB. Course is worse in AM and white colored phlegm.  SOB has been worse the lat couple of days.   Feels full all the time. Stays started around the time started the welchol.  Was taking welchol and gettig costipated.  Sys bowels are better.    Review of Systems     Objective:   Physical Exam  Constitutional: She is oriented to person, place, and time. She appears well-developed and well-nourished.  HENT:  Head: Normocephalic and atraumatic.  Right Ear: External ear normal.  Left Ear: External ear normal.  Nose: Nose normal.  Mouth/Throat: Oropharynx is clear and moist.       TMs and canals are clear.   Eyes: Conjunctivae and EOM are normal. Pupils are equal, round, and reactive to light.  Neck: Neck supple. No thyromegaly present.  Cardiovascular: Normal rate, regular rhythm and normal heart sounds.   Pulmonary/Chest: Effort normal. She has wheezes.       Wheez in upper lobes. Louder on the right. Able to her on right ant chest as well.   Abdominal: Soft. Bowel sounds are normal. She exhibits no distension and no mass. There is no tenderness. There is no rebound and no guarding.  Lymphadenopathy:    She has no cervical adenopathy.  Neurological: She is alert and oriented to person, place, and time.  Skin: Skin is warm and dry.  Psychiatric: She has a normal mood and affect. Her behavior is normal.          Assessment & Plan:  COPD exacerbation - will tx with steroids and Azithromycin.  Call if not getting better in one week. No fever which is reassuring.  Continue TID Nebs. Given samples of spiriva.   Fullness.- Start a stool softener when gets back in town and get bowels moving a little more regularly. If still feeling full then pleaes follow up. Exam of  abdomen is nl.

## 2011-07-09 NOTE — Patient Instructions (Signed)
Call if not better in about one week.

## 2011-07-31 ENCOUNTER — Ambulatory Visit (INDEPENDENT_AMBULATORY_CARE_PROVIDER_SITE_OTHER): Payer: Medicare Other | Admitting: Family Medicine

## 2011-07-31 ENCOUNTER — Encounter: Payer: Self-pay | Admitting: Family Medicine

## 2011-07-31 VITALS — BP 140/74 | HR 95 | Temp 98.7°F | Wt 116.0 lb

## 2011-07-31 DIAGNOSIS — R142 Eructation: Secondary | ICD-10-CM

## 2011-07-31 DIAGNOSIS — R6881 Early satiety: Secondary | ICD-10-CM

## 2011-07-31 DIAGNOSIS — R1013 Epigastric pain: Secondary | ICD-10-CM | POA: Diagnosis not present

## 2011-07-31 DIAGNOSIS — R140 Abdominal distension (gaseous): Secondary | ICD-10-CM

## 2011-07-31 DIAGNOSIS — R141 Gas pain: Secondary | ICD-10-CM | POA: Diagnosis not present

## 2011-07-31 DIAGNOSIS — R143 Flatulence: Secondary | ICD-10-CM | POA: Diagnosis not present

## 2011-07-31 NOTE — Progress Notes (Signed)
Subjective:    Patient ID: Tamara Adams, female    DOB: [birth year], 71 y.o.   MRN: [id number]  HPI Still feels bloating and full feeling. She had mentioned at her last office visit. I have recommended stool softeners.  Tried miralax and dulcolax and has been having normal BMs and no relief.  Early satiety. No fever. No blood in stool.  No vomiting.  Never had a colonoscopy.  No actual pain but says her pants to feel uncomfortable like that and is pinching her waist.  Still hs GB.  No family hx of GI cancer, etc.  Tried a TUMs a few times - maybe helps.  Also tried GAS-X - no helps.  No alleviating sxs. Worse with eating. Feels it every time she eats. No GERD sxs.    Review of Systems BP 140/74  Pulse 95  Temp 98.7 F (37.1 C)  Wt 116 lb (52.617 kg)    No Known Allergies  Past Medical History  Diagnosis Date  . COPD (chronic obstructive pulmonary disease)     FVC 72%, FEV1 29%, FEV1 ratio 32% (very severe (COPD)    Past Surgical History  Procedure Date  . Abdominal hysterectomy 1987    and one ovary  . Oophorectomy 1992    History   Social History  . Marital Status: Married    Spouse Name: N/A    Number of Children: N/A  . Years of Education: N/A   Occupational History  . Not on file.   Social History Main Topics  . Smoking status: Current Everyday Smoker  . Smokeless tobacco: Not on file  . Alcohol Use: No  . Drug Use: No  . Sexually Active:    Other Topics Concern  . Not on file   Social History Narrative  . No narrative on file    Family History  Problem Relation Age of Onset  . Hypertension Mother   . Cancer Mother     ovarian    Current outpatient prescriptions:albuterol (PROVENTIL) (2.5 MG/3ML) 0.083% nebulizer solution, Take 2.5 mg by nebulization 3 (three) times daily.  , Disp: , Rfl: ;  albuterol (VENTOLIN HFA) 108 (90 BASE) MCG/ACT inhaler, Inhale 2 puffs into the lungs every 6 (six) hours as needed. 2-4 puffs inhaled every 4-6 hours , Disp: ,  Rfl:  LORazepam (ATIVAN) 0.5 MG tablet, Take 1 tablet (0.5 mg total) by mouth daily as needed for anxiety., Disp: 90 tablet, Rfl: 0;  LOTREL 10-20 MG per capsule, TAKE 1 CAPSULE ONCE A DAY, Disp: 30 capsule, Rfl: 1;  QVAR 80 MCG/ACT inhaler, INHALE 2 PUFFS INTO THE LUNGS 2 TIMES A DAY, Disp: 1 Inhaler, Rfl: 1;  SPIRIVA HANDIHALER 18 MCG inhalation capsule, USE 2 INHALATIONS FROM 1 CAPSULE ONLY ONCE DAILY, Disp: 30 capsule, Rfl: 1 triamterene-hydrochlorothiazide (MAXZIDE-25) 37.5-25 MG per tablet, TAKE 1 TABLET EVERY DAY, Disp: 30 tablet, Rfl: 1      Objective:   Physical Exam  Constitutional: She is oriented to person, place, and time. She appears well-developed and well-nourished.  HENT:  Head: Normocephalic and atraumatic.  Abdominal: Soft. Bowel sounds are normal. She exhibits no distension and no mass. There is tenderness. There is no rebound and no guarding.       Tender in the epigastrum.   Musculoskeletal: She exhibits no edema.  Neurological: She is alert and oriented to person, place, and time.  Skin: Skin is warm and dry.  Psychiatric: She has a normal mood and affect. Her behavior is normal.  Assessment & Plan:  Early satiety/bloating-she's never had a screening colonoscopy or endoscopy. I would like to refer to gastroenterology for further evaluation. For today I will check a CBC, CMP, amylase, lipase. I make sure her pancreas and liver functions are normal. Consider this too could be her gallbladder but she is not having any significant pain per se. I would also like to try a PPI for the next 10 days to see if this makes a difference or not. We will give her samples her in the office of [facility].

## 2011-07-31 NOTE — Patient Instructions (Signed)
Try the PPI samples and let me know if helping.  We will call with the GI referral

## 2011-08-01 LAB — CBC WITH DIFFERENTIAL/PLATELET
Basophils Relative: 1 % (ref 0–1)
Eosinophils Absolute: 0.1 10*3/uL (ref 0.0–0.7)
Hemoglobin: 14.9 g/dL (ref 12.0–15.0)
Lymphs Abs: 2 10*3/uL (ref 0.7–4.0)
MCH: 32 pg (ref 26.0–34.0)
MCHC: 34 g/dL (ref 30.0–36.0)
Monocytes Relative: 8 % (ref 3–12)
Neutro Abs: 3.9 10*3/uL (ref 1.7–7.7)
Neutrophils Relative %: 60 % (ref 43–77)
Platelets: 341 10*3/uL (ref 150–400)
RBC: 4.65 MIL/uL (ref 3.87–5.11)

## 2011-08-01 LAB — COMPLETE METABOLIC PANEL WITH GFR
ALT: 13 U/L (ref 0–35)
BUN: 6 mg/dL (ref 6–23)
CO2: 29 mEq/L (ref 19–32)
Calcium: 9.4 mg/dL (ref 8.4–10.5)
Chloride: 101 mEq/L (ref 96–112)
Creat: 0.73 mg/dL (ref 0.50–1.10)
GFR, Est African American: >89 mL/min
GFR, Est Non African American: 84 mL/min
Glucose, Bld: 104 mg/dL — ABNORMAL HIGH (ref 70–99)
Total Bilirubin: 0.3 mg/dL (ref 0.3–1.2)

## 2011-08-01 LAB — LIPASE: Lipase: 46 U/L (ref 0–75)

## 2011-08-01 LAB — AMYLASE: Amylase: 89 U/L (ref 0–105)

## 2011-08-14 DIAGNOSIS — R6881 Early satiety: Secondary | ICD-10-CM | POA: Diagnosis not present

## 2011-08-17 DIAGNOSIS — R6881 Early satiety: Secondary | ICD-10-CM | POA: Diagnosis not present

## 2011-08-31 DIAGNOSIS — R6881 Early satiety: Secondary | ICD-10-CM | POA: Diagnosis not present

## 2011-08-31 DIAGNOSIS — R142 Eructation: Secondary | ICD-10-CM | POA: Diagnosis not present

## 2011-08-31 DIAGNOSIS — R143 Flatulence: Secondary | ICD-10-CM | POA: Diagnosis not present

## 2011-09-21 ENCOUNTER — Other Ambulatory Visit: Payer: Self-pay | Admitting: Family Medicine

## 2011-09-21 DIAGNOSIS — D126 Benign neoplasm of colon, unspecified: Secondary | ICD-10-CM

## 2011-09-21 HISTORY — DX: Benign neoplasm of colon, unspecified: D12.6

## 2011-10-10 ENCOUNTER — Encounter: Payer: Self-pay | Admitting: *Deleted

## 2011-10-10 DIAGNOSIS — R634 Abnormal weight loss: Secondary | ICD-10-CM | POA: Diagnosis not present

## 2011-10-10 DIAGNOSIS — K254 Chronic or unspecified gastric ulcer with hemorrhage: Secondary | ICD-10-CM | POA: Diagnosis not present

## 2011-10-10 DIAGNOSIS — R6881 Early satiety: Secondary | ICD-10-CM | POA: Diagnosis not present

## 2011-10-10 DIAGNOSIS — D126 Benign neoplasm of colon, unspecified: Secondary | ICD-10-CM | POA: Diagnosis not present

## 2011-10-10 DIAGNOSIS — K319 Disease of stomach and duodenum, unspecified: Secondary | ICD-10-CM | POA: Diagnosis not present

## 2011-10-10 DIAGNOSIS — R143 Flatulence: Secondary | ICD-10-CM | POA: Diagnosis not present

## 2011-10-10 DIAGNOSIS — Z1211 Encounter for screening for malignant neoplasm of colon: Secondary | ICD-10-CM | POA: Diagnosis not present

## 2011-10-10 DIAGNOSIS — K259 Gastric ulcer, unspecified as acute or chronic, without hemorrhage or perforation: Secondary | ICD-10-CM | POA: Diagnosis not present

## 2011-10-15 ENCOUNTER — Encounter: Payer: Self-pay | Admitting: *Deleted

## 2011-11-02 DIAGNOSIS — R634 Abnormal weight loss: Secondary | ICD-10-CM | POA: Diagnosis not present

## 2011-11-02 DIAGNOSIS — R143 Flatulence: Secondary | ICD-10-CM | POA: Diagnosis not present

## 2011-11-02 DIAGNOSIS — R6881 Early satiety: Secondary | ICD-10-CM | POA: Diagnosis not present

## 2011-11-05 DIAGNOSIS — R141 Gas pain: Secondary | ICD-10-CM | POA: Diagnosis not present

## 2011-11-05 DIAGNOSIS — K5730 Diverticulosis of large intestine without perforation or abscess without bleeding: Secondary | ICD-10-CM | POA: Diagnosis not present

## 2011-11-05 DIAGNOSIS — K769 Liver disease, unspecified: Secondary | ICD-10-CM | POA: Diagnosis not present

## 2011-11-05 DIAGNOSIS — R634 Abnormal weight loss: Secondary | ICD-10-CM | POA: Diagnosis not present

## 2011-11-07 DIAGNOSIS — R141 Gas pain: Secondary | ICD-10-CM | POA: Diagnosis not present

## 2011-11-07 DIAGNOSIS — R142 Eructation: Secondary | ICD-10-CM | POA: Diagnosis not present

## 2011-11-07 DIAGNOSIS — R634 Abnormal weight loss: Secondary | ICD-10-CM | POA: Diagnosis not present

## 2011-11-08 ENCOUNTER — Encounter: Payer: Self-pay | Admitting: Family Medicine

## 2011-11-08 ENCOUNTER — Other Ambulatory Visit: Payer: Self-pay | Admitting: Family Medicine

## 2011-11-30 ENCOUNTER — Other Ambulatory Visit: Payer: Self-pay | Admitting: *Deleted

## 2011-11-30 MED ORDER — LORAZEPAM 0.5 MG PO TABS: 0.5000 mg | ORAL_TABLET | Freq: Every day | ORAL | Status: DC | PRN

## 2011-12-01 ENCOUNTER — Other Ambulatory Visit: Payer: Self-pay | Admitting: Family Medicine

## 2011-12-03 DIAGNOSIS — K319 Disease of stomach and duodenum, unspecified: Secondary | ICD-10-CM | POA: Diagnosis not present

## 2012-02-12 ENCOUNTER — Other Ambulatory Visit: Payer: Self-pay | Admitting: Family Medicine

## 2012-03-12 ENCOUNTER — Other Ambulatory Visit: Payer: Self-pay | Admitting: Family Medicine

## 2012-03-17 ENCOUNTER — Encounter: Payer: Self-pay | Admitting: Family Medicine

## 2012-03-17 ENCOUNTER — Ambulatory Visit (INDEPENDENT_AMBULATORY_CARE_PROVIDER_SITE_OTHER): Payer: Medicare Other | Admitting: Family Medicine

## 2012-03-17 VITALS — BP 132/69 | HR 77 | Wt 108.0 lb

## 2012-03-17 DIAGNOSIS — R21 Rash and other nonspecific skin eruption: Secondary | ICD-10-CM

## 2012-03-17 NOTE — Patient Instructions (Addendum)
We will call you with the KOH results.  

## 2012-03-17 NOTE — Progress Notes (Signed)
  Subjective:    Patient ID: Tamara Adams, female    DOB: [birth year], 71 y.o.   MRN: [id number]  HPI Rash on her back for 2-3 months.  Says at first thought it was her detergent.  So changed to fragrance free, etc, then changed soaps. Started taking tepid showers. Did use benadryl and helps some but makes her groggy. Says has been drinking plenty of water.     Review of Systems     Objective:   Physical Exam  Constitutional: She is oriented to person, place, and time. She appears well-developed and well-nourished.  HENT:  Head: Normocephalic and atraumatic.  Musculoskeletal:       Erythematous patchy scaling rash on her upper and mid lower back.  It is symmetric. No breaks in the skin or exoriations.   Neurological: She is alert and oriented to person, place, and time.  Skin: Skin is warm and dry.  Psychiatric: She has a normal mood and affect. Her behavior is normal.          Assessment & Plan:  Rash on her back - RAsh is consistant with  Either an eczematous patch or contact dermatitis.  She has ruled out her soap, detergent etc. Thus I think a contact dermatitis is now less likely and only on her back. I did do a KOH for scraping today to rule out fungal element. That's negative then we'll treat with topical steroid cream for eczema. If not significantly better in 2 weeks consider further evaluation with possible biopsy.

## 2012-03-18 ENCOUNTER — Other Ambulatory Visit: Payer: Self-pay | Admitting: Family Medicine

## 2012-03-18 MED ORDER — TRIAMCINOLONE ACETONIDE 0.5 % EX OINT: TOPICAL_OINTMENT | Freq: Two times a day (BID) | CUTANEOUS | Status: DC

## 2012-03-19 ENCOUNTER — Telehealth: Payer: Self-pay | Admitting: *Deleted

## 2012-03-19 NOTE — Telephone Encounter (Signed)
Pt notified of KOh results and canceled rx at [facility]

## 2012-04-01 DIAGNOSIS — R634 Abnormal weight loss: Secondary | ICD-10-CM | POA: Diagnosis not present

## 2012-04-08 ENCOUNTER — Other Ambulatory Visit: Payer: Self-pay | Admitting: *Deleted

## 2012-04-08 MED ORDER — LORAZEPAM 0.5 MG PO TABS: 0.5000 mg | ORAL_TABLET | Freq: Every day | ORAL | Status: DC | PRN

## 2012-05-07 DIAGNOSIS — Z23 Encounter for immunization: Secondary | ICD-10-CM | POA: Diagnosis not present

## 2012-05-12 DIAGNOSIS — H2589 Other age-related cataract: Secondary | ICD-10-CM | POA: Diagnosis not present

## 2012-05-12 DIAGNOSIS — H526 Other disorders of refraction: Secondary | ICD-10-CM | POA: Diagnosis not present

## 2012-05-19 DIAGNOSIS — Z01818 Encounter for other preprocedural examination: Secondary | ICD-10-CM | POA: Diagnosis not present

## 2012-05-26 ENCOUNTER — Other Ambulatory Visit: Payer: Self-pay | Admitting: Family Medicine

## 2012-05-29 ENCOUNTER — Other Ambulatory Visit: Payer: Self-pay | Admitting: *Deleted

## 2012-05-29 DIAGNOSIS — H2510 Age-related nuclear cataract, unspecified eye: Secondary | ICD-10-CM | POA: Diagnosis not present

## 2012-05-29 DIAGNOSIS — I10 Essential (primary) hypertension: Secondary | ICD-10-CM | POA: Diagnosis not present

## 2012-05-29 DIAGNOSIS — Z79899 Other long term (current) drug therapy: Secondary | ICD-10-CM | POA: Diagnosis not present

## 2012-05-29 DIAGNOSIS — J449 Chronic obstructive pulmonary disease, unspecified: Secondary | ICD-10-CM | POA: Diagnosis not present

## 2012-05-29 DIAGNOSIS — H0289 Other specified disorders of eyelid: Secondary | ICD-10-CM | POA: Diagnosis not present

## 2012-05-29 DIAGNOSIS — H2589 Other age-related cataract: Secondary | ICD-10-CM | POA: Diagnosis not present

## 2012-05-29 MED ORDER — TIOTROPIUM BROMIDE MONOHYDRATE 18 MCG IN CAPS: ORAL_CAPSULE | RESPIRATORY_TRACT | Status: DC

## 2012-06-02 ENCOUNTER — Other Ambulatory Visit: Payer: Self-pay | Admitting: *Deleted

## 2012-06-02 MED ORDER — TIOTROPIUM BROMIDE MONOHYDRATE 18 MCG IN CAPS: ORAL_CAPSULE | RESPIRATORY_TRACT | Status: DC

## 2012-06-12 DIAGNOSIS — H2589 Other age-related cataract: Secondary | ICD-10-CM | POA: Diagnosis not present

## 2012-06-12 DIAGNOSIS — J449 Chronic obstructive pulmonary disease, unspecified: Secondary | ICD-10-CM | POA: Diagnosis not present

## 2012-06-12 DIAGNOSIS — H2510 Age-related nuclear cataract, unspecified eye: Secondary | ICD-10-CM | POA: Diagnosis not present

## 2012-06-12 DIAGNOSIS — I10 Essential (primary) hypertension: Secondary | ICD-10-CM | POA: Diagnosis not present

## 2012-06-12 DIAGNOSIS — Z79899 Other long term (current) drug therapy: Secondary | ICD-10-CM | POA: Diagnosis not present

## 2012-06-12 DIAGNOSIS — H0289 Other specified disorders of eyelid: Secondary | ICD-10-CM | POA: Diagnosis not present

## 2012-06-12 DIAGNOSIS — E7800 Pure hypercholesterolemia, unspecified: Secondary | ICD-10-CM | POA: Diagnosis not present

## 2012-06-12 HISTORY — PX: CATARACT EXTRACTION W/PHACO: SHX586

## 2012-06-22 HISTORY — PX: CATARACT EXTRACTION W/ INTRAOCULAR LENS  IMPLANT, BILATERAL: SHX1307

## 2012-07-08 ENCOUNTER — Encounter: Payer: Self-pay | Admitting: Family Medicine

## 2012-07-25 ENCOUNTER — Encounter: Payer: Self-pay | Admitting: Physician Assistant

## 2012-07-25 ENCOUNTER — Ambulatory Visit (INDEPENDENT_AMBULATORY_CARE_PROVIDER_SITE_OTHER): Payer: Medicare Other | Admitting: Physician Assistant

## 2012-07-25 VITALS — BP 144/78 | HR 95 | Temp 97.8°F | Ht 65.0 in | Wt 106.0 lb

## 2012-07-25 DIAGNOSIS — N390 Urinary tract infection, site not specified: Secondary | ICD-10-CM | POA: Diagnosis not present

## 2012-07-25 DIAGNOSIS — R300 Dysuria: Secondary | ICD-10-CM

## 2012-07-25 LAB — POCT URINALYSIS DIPSTICK
Leukocytes, UA: NEGATIVE
Protein, UA: 30
Urobilinogen, UA: 0.2
pH, UA: 6.5

## 2012-07-25 MED ORDER — SULFAMETHOXAZOLE-TMP DS 800-160 MG PO TABS: 1.0000 | ORAL_TABLET | Freq: Two times a day (BID) | ORAL | Status: DC

## 2012-07-25 NOTE — Patient Instructions (Signed)
Urinary Tract Infection Urinary tract infections (UTIs) can develop anywhere along your urinary tract. Your urinary tract is your body's drainage system for removing wastes and extra water. Your urinary tract includes two kidneys, two ureters, a bladder, and a urethra. Your kidneys are a pair of bean-shaped organs. Each kidney is about the size of your fist. They are located below your ribs, one on each side of your spine. CAUSES Infections are caused by microbes, which are microscopic organisms, including fungi, viruses, and bacteria. These organisms are so small that they can only be seen through a microscope. Bacteria are the microbes that most commonly cause UTIs. SYMPTOMS  Symptoms of UTIs may vary by age and gender of the patient and by the location of the infection. Symptoms in young women typically include a frequent and intense urge to urinate and a painful, burning feeling in the bladder or urethra during urination. Older women and men are more likely to be tired, shaky, and weak and have muscle aches and abdominal pain. A fever may mean the infection is in your kidneys. Other symptoms of a kidney infection include pain in your back or sides below the ribs, nausea, and vomiting. DIAGNOSIS To diagnose a UTI, your caregiver will ask you about your symptoms. Your caregiver also will ask to provide a urine sample. The urine sample will be tested for bacteria and white blood cells. White blood cells are made by your body to help fight infection. TREATMENT  Typically, UTIs can be treated with medication. Because most UTIs are caused by a bacterial infection, they usually can be treated with the use of antibiotics. The choice of antibiotic and length of treatment depend on your symptoms and the type of bacteria causing your infection. HOME CARE INSTRUCTIONS  If you were prescribed antibiotics, take them exactly as your caregiver instructs you. Finish the medication even if you feel better after you  have only taken some of the medication.  Drink enough water and fluids to keep your urine clear or pale yellow.  Avoid caffeine, tea, and carbonated beverages. They tend to irritate your bladder.  Empty your bladder often. Avoid holding urine for long periods of time.  Empty your bladder before and after sexual intercourse.  After a bowel movement, women should cleanse from front to back. Use each tissue only once. SEEK MEDICAL CARE IF:   You have back pain.  You develop a fever.  Your symptoms do not begin to resolve within 3 days. SEEK IMMEDIATE MEDICAL CARE IF:   You have severe back pain or lower abdominal pain.  You develop chills.  You have nausea or vomiting.  You have continued burning or discomfort with urination. MAKE SURE YOU:   Understand these instructions.  Will watch your condition.  Will get help right away if you are not doing well or get worse. Document Released: 04/18/2005 Document Revised: 01/08/2012 Document Reviewed: 08/17/2011 [employer] Patient Information 2013 [employer].  

## 2012-07-25 NOTE — Progress Notes (Signed)
  Subjective:    Patient ID: Tamara Adams, female    DOB: January 08, 1941, 72 y.o.   MRN: [id number]  Urinary Tract Infection  This is a new problem. The problem occurs every urination. The problem has been gradually worsening. The quality of the pain is described as aching and burning (Heaviness with abdominal pressure.). The pain is at a severity of 8/10. The pain is moderate. There has been no fever. She is not sexually active. There is no history of pyelonephritis. Associated symptoms include flank pain, frequency, hesitancy, a possible pregnancy and urgency. Pertinent negatives include no chills, discharge, hematuria, nausea, sweats or vomiting. She has tried increased fluids for the symptoms. The treatment provided mild relief. once a year.      Review of Systems  Constitutional: Negative for chills.  Gastrointestinal: Negative for nausea and vomiting.  Genitourinary: Positive for hesitancy, urgency, frequency and flank pain. Negative for hematuria.       Objective:   Physical Exam  Constitutional: She is oriented to person, place, and time. She appears well-developed and well-nourished.  HENT:  Head: Normocephalic and atraumatic.  Cardiovascular: Normal rate and normal heart sounds.   Pulmonary/Chest: Effort normal and breath sounds normal. She has no wheezes.       No CVA tenderness.  Abdominal: Soft. There is tenderness.       Mild discomfort to palpation bilateral lower abdomen.  Neurological: She is alert and oriented to person, place, and time.  Skin: Skin is warm and dry.  Psychiatric: She has a normal mood and affect. Her behavior is normal.          Assessment & Plan:  UTI- UA was positive for blood only. Discussed with pt that we will go ahead and treat since symptomatic. Did send urine off for culture. Bactrim sent to pharmacy for 3 days. UTI handout given and symptomatic care discussed. Follow up if not improving.

## 2012-07-27 LAB — URINE CULTURE: Colony Count: 25000

## 2012-07-31 DIAGNOSIS — H2510 Age-related nuclear cataract, unspecified eye: Secondary | ICD-10-CM | POA: Diagnosis not present

## 2012-07-31 DIAGNOSIS — H5340 Unspecified visual field defects: Secondary | ICD-10-CM | POA: Diagnosis not present

## 2012-09-11 ENCOUNTER — Other Ambulatory Visit: Payer: Self-pay | Admitting: *Deleted

## 2012-09-11 ENCOUNTER — Other Ambulatory Visit: Payer: Self-pay | Admitting: Family Medicine

## 2012-09-11 MED ORDER — LORAZEPAM 0.5 MG PO TABS: 0.5000 mg | ORAL_TABLET | Freq: Every day | ORAL | Status: DC | PRN

## 2012-09-15 ENCOUNTER — Encounter: Payer: Self-pay | Admitting: Physician Assistant

## 2012-09-15 ENCOUNTER — Ambulatory Visit (INDEPENDENT_AMBULATORY_CARE_PROVIDER_SITE_OTHER): Payer: Medicare Other | Admitting: Physician Assistant

## 2012-09-15 VITALS — BP 129/63 | HR 118 | Temp 98.9°F | Wt 103.0 lb

## 2012-09-15 MED ORDER — DOXYCYCLINE HYCLATE 100 MG PO CAPS: 100.0000 mg | ORAL_CAPSULE | Freq: Two times a day (BID) | ORAL | Status: DC

## 2012-09-15 MED ORDER — PREDNISONE 20 MG PO TABS: ORAL_TABLET | ORAL | Status: DC

## 2012-09-15 NOTE — Progress Notes (Signed)
  Subjective:    Patient ID: Tamara Adams, female    DOB: 1940-10-22, 72 y.o.   MRN: [id number]  HPI Patient is a 72 yo female who presents to the clinic with fever, cough, SOB and fatigue for 4 days. She has COPD and taking inhalers as needed and they are helping. Has not increased her albuterol inhaler because she is already taking 3 times a day.  Fever was 2 days ago and 101.4 at highest. Cough is productive. Hurts to breath. Not taking anything OTC to help. Denies any sinus pressure, ear pain or ST. Denies any N/V/D.    Review of Systems     Objective:   Physical Exam  Constitutional: She is oriented to person, place, and time.  Appears fatigued and out of breath today.   HENT:  Head: Normocephalic and atraumatic.  Right Ear: External ear normal.  Nose: Nose normal.  Mouth/Throat: Oropharynx is clear and moist.  Eyes: Conjunctivae are normal.  Clear watery discharge from both eyes.   Neck: Normal range of motion. Neck supple.  Cardiovascular: Regular rhythm and normal heart sounds.   Tachycardia at 118.  Pulmonary/Chest:  Decreased effort bilaterally with wheezing throughout.   Abdominal: Soft. Bowel sounds are normal.  Lymphadenopathy:    She has no cervical adenopathy.  Neurological: She is alert and oriented to person, place, and time.  Skin: Skin is warm and dry.  Psychiatric: She has a normal mood and affect. Her behavior is normal.          Assessment & Plan:  COPD exacerbation- Gave prednisone for 5 days with doxycyline. Discussed her concern of pneumonia and abx should help even if she were starting to get pneumonia. If not improving in next 24-48 hours will get chest x-ray. Continue to use inhalers as directed.

## 2012-09-18 ENCOUNTER — Ambulatory Visit: Payer: Medicare Other | Admitting: Family Medicine

## 2012-09-23 DIAGNOSIS — I7090 Unspecified atherosclerosis: Secondary | ICD-10-CM | POA: Diagnosis not present

## 2012-09-23 DIAGNOSIS — K449 Diaphragmatic hernia without obstruction or gangrene: Secondary | ICD-10-CM | POA: Diagnosis not present

## 2012-09-23 DIAGNOSIS — K5730 Diverticulosis of large intestine without perforation or abscess without bleeding: Secondary | ICD-10-CM | POA: Diagnosis not present

## 2012-09-25 ENCOUNTER — Encounter: Payer: Self-pay | Admitting: Family Medicine

## 2012-09-25 ENCOUNTER — Ambulatory Visit (INDEPENDENT_AMBULATORY_CARE_PROVIDER_SITE_OTHER): Payer: Medicare Other | Admitting: Family Medicine

## 2012-09-25 VITALS — BP 112/56 | HR 92 | Ht 65.0 in | Wt 102.0 lb

## 2012-09-25 DIAGNOSIS — R5381 Other malaise: Secondary | ICD-10-CM

## 2012-09-25 DIAGNOSIS — Z0000 Encounter for general adult medical examination without abnormal findings: Secondary | ICD-10-CM

## 2012-09-25 DIAGNOSIS — E785 Hyperlipidemia, unspecified: Secondary | ICD-10-CM

## 2012-09-25 DIAGNOSIS — I10 Essential (primary) hypertension: Secondary | ICD-10-CM

## 2012-09-25 DIAGNOSIS — L84 Corns and callosities: Secondary | ICD-10-CM

## 2012-09-25 DIAGNOSIS — Z23 Encounter for immunization: Secondary | ICD-10-CM

## 2012-09-25 NOTE — Progress Notes (Signed)
  Subjective:    Patient ID: Tamara Adams, female    DOB: 07-22-1941, 72 y.o.   MRN: [id number]  HPI  Cryotherapy Procedure Note  Pre-operative Diagnosis: corn  Post-operative Diagnosis: corn  Locations: Between fourth and fifth toes on right foot.    Indications: pain   Anesthesia: none  Procedure Details  Patient informed of risks (permanent scarring, infection, light or dark discoloration, bleeding, infection, weakness, numbness and recurrence of the lesion) and benefits of the procedure and verbal informed consent obtained.  The areas are treated with liquid nitrogen therapy, frozen until ice ball extended 2 mm beyond lesion, allowed to thaw, and treated again. The patient tolerated procedure well.  The patient was instructed on post-op care, warned that there may be blister formation, redness and pain. Recommend OTC analgesia as needed for pain.  Condition: Stable  Complications: none.  Plan: 1. Instructed to keep the area dry and covered for 24-48h and clean thereafter. 2. Warning signs of infection were reviewed.   3. Recommended that the patient use OTC acetaminophen as needed for pain.  4. Return in 2 weeks if not resolved.    Review of Systems     Objective:   Physical Exam        Assessment & Plan:

## 2012-09-25 NOTE — Progress Notes (Signed)
Subjective:    Tamara Adams is a 72 y.o. female who presents for Medicare Annual/Subsequent preventive examination. She is here with her granddaughter today.  Preventive Screening-Counseling & Management  Tobacco History  Smoking status  . Current Every Day Smoker  Smokeless tobacco  . Not on file    Comment: Using Electronic cigarettes     Problems Prior to Visit 1. pt c/o ?wart or corn between toes. She has tried some over-the-counter corn treatments but has had difficulty getting up between her toes. Though it was not helpful. It is tender when she wears shoes for a while it becomes sore. 2. she has had cataract surgery, right and left, since her last CPE 3. she has felt more fatigued the last 2 weeks. No blood in her urine or stool. No unexplained cough, shortness of breath or chest pain.   Current Problems (verified) Patient Active Problem List  Diagnosis  . HYPERLIPIDEMIA  . TOBACCO ABUSE  . HYPERTENSION, BENIGN  . COPD  . INSOMNIA  . PALPITATIONS  . CAROTID BRUIT  . CHEST PAIN, PRECORDIAL    Medications Prior to Visit Current Outpatient Prescriptions on File Prior to Visit  Medication Sig Dispense Refill  . albuterol (PROVENTIL) (2.5 MG/3ML) 0.083% nebulizer solution USE 1 VIAL VIA NEBULIZER 3 TIMES A DAY ,SCHEDULED  9 vial  2  . albuterol (VENTOLIN HFA) 108 (90 BASE) MCG/ACT inhaler Inhale 2 puffs into the lungs every 6 (six) hours as needed. 2-4 puffs inhaled every 4-6 hours       . amLODipine-benazepril (LOTREL) 10-20 MG per capsule TAKE 1 CAPSULE ONCE A DAY  60 capsule  1  . doxycycline (VIBRAMYCIN) 100 MG capsule Take 1 capsule (100 mg total) by mouth 2 (two) times daily. For 10 days.  20 capsule  0  . LORazepam (ATIVAN) 0.5 MG tablet Take 1 tablet (0.5 mg total) by mouth daily as needed for anxiety.  90 tablet  0  . predniSONE (DELTASONE) 20 MG tablet 2 tabs daily for 5 days.  10 tablet  0  . QVAR 80 MCG/ACT inhaler INHALE 2 PUFFS INTO THE LUNGS 2 TIMES A DAY   3 g  0  . tiotropium (SPIRIVA HANDIHALER) 18 MCG inhalation capsule Use 2 inhalations from 1 capsule only once daily  90 capsule  1  . triamterene-hydrochlorothiazide (MAXZIDE-25) 37.5-25 MG per tablet TAKE 1 TABLET EVERY DAY  60 tablet  1   No current facility-administered medications on file prior to visit.    Current Medications (verified) Current Outpatient Prescriptions  Medication Sig Dispense Refill  . albuterol (PROVENTIL) (2.5 MG/3ML) 0.083% nebulizer solution USE 1 VIAL VIA NEBULIZER 3 TIMES A DAY ,SCHEDULED  9 vial  2  . albuterol (VENTOLIN HFA) 108 (90 BASE) MCG/ACT inhaler Inhale 2 puffs into the lungs every 6 (six) hours as needed. 2-4 puffs inhaled every 4-6 hours       . amLODipine-benazepril (LOTREL) 10-20 MG per capsule TAKE 1 CAPSULE ONCE A DAY  60 capsule  1  . doxycycline (VIBRAMYCIN) 100 MG capsule Take 1 capsule (100 mg total) by mouth 2 (two) times daily. For 10 days.  20 capsule  0  . LORazepam (ATIVAN) 0.5 MG tablet Take 1 tablet (0.5 mg total) by mouth daily as needed for anxiety.  90 tablet  0  . predniSONE (DELTASONE) 20 MG tablet 2 tabs daily for 5 days.  10 tablet  0  . QVAR 80 MCG/ACT inhaler INHALE 2 PUFFS INTO THE LUNGS 2 TIMES  A DAY  3 g  0  . tiotropium (SPIRIVA HANDIHALER) 18 MCG inhalation capsule Use 2 inhalations from 1 capsule only once daily  90 capsule  1  . triamterene-hydrochlorothiazide (MAXZIDE-25) 37.5-25 MG per tablet TAKE 1 TABLET EVERY DAY  60 tablet  1   No current facility-administered medications for this visit.     Allergies (verified) Review of patient's allergies indicates no known allergies.   PAST HISTORY  Family History Family History  Problem Relation Age of Onset  . Hypertension Mother   . Cancer Mother     ovarian    Social History History  Substance Use Topics  . Smoking status: Current Every Day Smoker  . Smokeless tobacco: Not on file     Comment: Using Electronic cigarettes  . Alcohol Use: No     Are there  smokers in your home (other than you)? No  Risk Factors Current exercise habits: none  Dietary issues discussed: none   Cardiac risk factors: advanced age (older than 66 for men, 48 for women), HTN  Depression Screen (Note: if answer to either of the following is "Yes", a more complete depression screening is indicated)   Over the past two weeks, have you felt down, depressed or hopeless? No  Over the past two weeks, have you felt little interest or pleasure in doing things? No  Have you lost interest or pleasure in daily life? No  Do you often feel hopeless? No  Do you cry easily over simple problems? No  Activities of Daily Living In your present state of health, do you have any difficulty performing the following activities?:  Driving? No Managing money?  No Feeding yourself? No Getting from bed to chair? No Climbing a flight of stairs? No Preparing food and eating?: No Bathing or showering? No Getting dressed: No Getting to the toilet? No Using the toilet:No Moving around from place to place: No In the past year have you fallen or had a near fall?:No   Are you sexually active?  No  Do you have more than one partner?  No  Hearing Difficulties: No Do you often ask people to speak up or repeat themselves? No Do you experience ringing or noises in your ears? No Do you have difficulty understanding soft or whispered voices? No   Do you feel that you have a problem with memory? No  Do you often misplace items? No  Do you feel safe at home?  Yes  Cognitive Testing  Alert? Yes  Normal Appearance?Yes  Oriented to person? Yes  Place? Yes   Time? Yes  Recall of three objects?  Yes  Can perform simple calculations? Yes  Displays appropriate judgment?Yes  Can read the correct time from a watch face?Yes   Advanced Directives have been discussed with the patient? Yes  List the Names of Other Physician/Practitioners you currently use: 1.  [facility] for her Gi ulcers.    Indicate any recent Medical Services you may have received from other than [facility] providers in the past year (date may be approximate).   There is no immunization history on file for this patient.  Screening Tests Health Maintenance  Topic Date Due  . Influenza Vaccine  03/23/2013  . Colonoscopy  10/09/2016  . Tetanus/tdap  07/30/2021  . Pneumococcal Polysaccharide Vaccine Age 15 And Over  Addressed  . Zostavax  Completed    All answers were reviewed with the patient and necessary referrals were made:  METHENEY,CATHERINE, MD   09/25/2012  History reviewed: allergies, current medications, past family history, past medical history, past social history, past surgical history and problem list  Review of Systems A comprehensive review of systems was negative.    Objective:     Vision by Snellen chart: right eye:20/25, left eye:20/25  Body mass index is 16.97 kg/(m^2). BP 112/56  Pulse 92  Ht 5\' 5"  (1.651 m)  Wt 102 lb (46.267 kg)  BMI 16.97 kg/m2  BP 112/56  Pulse 92  Ht 5\' 5"  (1.651 m)  Wt 102 lb (46.267 kg)  BMI 16.97 kg/m2  General Appearance:    Alert, cooperative, no distress, appears stated age  Head:    Normocephalic, without obvious abnormality, atraumatic  Eyes:    PERRL, conjunctiva/corneas clear, EOM's intact, both eyes  Ears:    Normal TM's and external ear canals, both ears  Nose:   Nares normal, septum midline, mucosa normal, no drainage    or sinus tenderness  Throat:   Lips, mucosa, and tongue normal; teeth and gums normal  Neck:   Supple, symmetrical, trachea midline, no adenopathy;    thyroid:  no enlargement/tenderness/nodules; no carotid   bruit or JVD  Back:     Symmetric, no curvature, ROM normal, no CVA tenderness  Lungs:     Clear to auscultation bilaterally, respirations unlabored  Chest Wall:    No tenderness or deformity   Heart:    Regular rate and rhythm, S1 and S2 normal, no murmur, rub   or gallop  Breast Exam:    Not performed   Abdomen:     Soft, non-tender, bowel sounds active all four quadrants,    no masses, no organomegaly  Genitalia:    Not peformed.   Rectal:    Not performed.   Extremities:   Extremities normal, atraumatic, no cyanosis or edema  Pulses:   2+ and symmetric all extremities  Skin:   Skin color, texture, turgor normal, no rashes or lesions  Lymph nodes:   Cervical, supraclavicular, and axillary nodes normal  Neurologic:   CNII-XII intact, normal strength, sensation and reflexes    throughout       Assessment:     Annual Wellness Exam       Plan:     During the course of the visit the patient was educated and counseled about appropriate screening and preventive services including:    Td vaccine  Encouraged her to set up a living will this year and put on her list of things to do.  Corn-discussed treatment options. Cryotherapy performed. Patient tolerated well.  Diet review for nutrition referral? Yes ____  Not Indicated _x__   Patient Instructions (the written plan) was given to the patient.  Medicare Attestation I have personally reviewed: The patient's medical and social history Their use of alcohol, tobacco or illicit drugs Their current medications and supplements The patient's functional ability including ADLs,fall risks, home safety risks, cognitive, and hearing and visual impairment Diet and physical activities Evidence for depression or mood disorders  The patient's weight, height, BMI, and visual acuity have been recorded in the chart.  I have made referrals, counseling, and provided education to the patient based on review of the above and I have provided the patient with a written personalized care plan for preventive services.     METHENEY,CATHERINE, MD   09/25/2012

## 2012-09-25 NOTE — Patient Instructions (Addendum)
Recommend cereva lotion for your back. It is over the counter.   Tdap given today. Keep up a regular exercise program and make sure you are eating a healthy diet Try to eat 4 servings of dairy a day, or if you are lactose intolerant take a calcium with vitamin D daily.  Your vaccines are up to date.    Corn between 4th and 5th toes on the right foot- options discussed. Cryotherapy performed. Patient tolerated well. Return in a couple weeks if needed repeat freezing.

## 2012-09-30 ENCOUNTER — Telehealth: Payer: Self-pay | Admitting: *Deleted

## 2012-09-30 NOTE — Telephone Encounter (Signed)
Called pt and lmovm asking her to go to lab to have fasting labs done at her convenience she will need to fast for at least 8 hours prior.Tamara Adams Kermit

## 2012-10-05 ENCOUNTER — Other Ambulatory Visit: Payer: Self-pay | Admitting: Family Medicine

## 2012-10-09 ENCOUNTER — Ambulatory Visit (INDEPENDENT_AMBULATORY_CARE_PROVIDER_SITE_OTHER): Payer: Medicare Other

## 2012-10-09 ENCOUNTER — Ambulatory Visit (INDEPENDENT_AMBULATORY_CARE_PROVIDER_SITE_OTHER): Payer: Medicare Other | Admitting: Sports Medicine

## 2012-10-09 ENCOUNTER — Encounter: Payer: Self-pay | Admitting: Sports Medicine

## 2012-10-09 VITALS — BP 121/65 | HR 93 | Wt 104.0 lb

## 2012-10-09 DIAGNOSIS — R5381 Other malaise: Secondary | ICD-10-CM

## 2012-10-09 DIAGNOSIS — R5383 Other fatigue: Secondary | ICD-10-CM

## 2012-10-09 DIAGNOSIS — J438 Other emphysema: Secondary | ICD-10-CM

## 2012-10-09 NOTE — Progress Notes (Signed)
Subjective:    CC: Weakness  HPI: Weakness: Avani is a pleasant 72 year old female who comes in with a vague complaint of feeling lousy and weak. She denies any headaches, visual changes, sore throat, cough, shortness of breath, nausea, vomiting, diarrhea, constipation. No fevers, chills. No abdominal pain. No burning when she urinates no vaginal discharge. She is able to keep orals down, and has been eating adequately as well as taking her medicines.  Past medical history, Surgical history, Family history not pertinant except as noted below, Social history, Allergies, and medications have been entered into the medical record, reviewed, and no changes needed.   Review of Systems: No fevers, chills, night sweats, weight loss, chest pain, or shortness of breath.   Objective:    General: Well Developed, well nourished, and in no acute distress.  Neuro: Alert and oriented x3, extra-ocular muscles intact, sensation grossly intact.  HEENT: Normocephalic, atraumatic, pupils equal round reactive to light, neck supple, no masses, no lymphadenopathy, thyroid nonpalpable.  Skin: Warm and dry, no rashes. Cardiac: Regular rate and rhythm, no murmurs rubs or gallops.  Respiratory: Decreased air movement throughout. Not using accessory muscles, speaking in full sentences. Abdomen: Soft, tender to palpation suprapubic, no rebound, no palpable masses. Normal bowel sounds.  Impression and Recommendations:

## 2012-10-09 NOTE — Assessment & Plan Note (Signed)
The only modality that I noted physical exam or suprapubic tenderness and poor air movement. This places pneumonia/COPD exacerbation and UTI at the top of the differential diagnosis. Checking CBC, CMET, UA, TSH. Chest x-ray. I will call her with the results of the treatment.  She should plan to followup in 2 weeks anyway.

## 2012-10-10 LAB — URINALYSIS
Bilirubin Urine: NEGATIVE
Glucose, UA: NEGATIVE mg/dL
Ketones, ur: NEGATIVE mg/dL
Leukocytes, UA: NEGATIVE
Nitrite: NEGATIVE
Protein, ur: NEGATIVE mg/dL
Specific Gravity, Urine: 1.016 (ref 1.005–1.030)
Urobilinogen, UA: 0.2 mg/dL (ref 0.0–1.0)
pH: 6.5 (ref 5.0–8.0)

## 2012-10-10 LAB — CBC WITH DIFFERENTIAL/PLATELET
Basophils Absolute: 0 K/uL (ref 0.0–0.1)
Basophils Relative: 1 % (ref 0–1)
Eosinophils Absolute: 0.1 K/uL (ref 0.0–0.7)
Eosinophils Relative: 2 % (ref 0–5)
HCT: 40.4 % (ref 36.0–46.0)
Hemoglobin: 13.9 g/dL (ref 12.0–15.0)
Lymphocytes Relative: 27 % (ref 12–46)
Lymphs Abs: 1.7 10*3/uL (ref 0.7–4.0)
MCH: 32 pg (ref 26.0–34.0)
MCHC: 34.4 g/dL (ref 30.0–36.0)
MCV: 93.1 fL (ref 78.0–100.0)
Monocytes Absolute: 0.6 K/uL (ref 0.1–1.0)
Monocytes Relative: 10 % (ref 3–12)
Neutro Abs: 4 K/uL (ref 1.7–7.7)
Neutrophils Relative %: 60 % (ref 43–77)
Platelets: 320 10*3/uL (ref 150–400)
RBC: 4.34 MIL/uL (ref 3.87–5.11)
RDW: 13.2 % (ref 11.5–15.5)
WBC: 6.4 10*3/uL (ref 4.0–10.5)

## 2012-10-10 LAB — COMPREHENSIVE METABOLIC PANEL WITH GFR
AST: 21 U/L (ref 0–37)
Albumin: 3.9 g/dL (ref 3.5–5.2)
Alkaline Phosphatase: 66 U/L (ref 39–117)
BUN: 10 mg/dL (ref 6–23)
Glucose, Bld: 101 mg/dL — ABNORMAL HIGH (ref 70–99)
Potassium: 4.2 meq/L (ref 3.5–5.3)
Sodium: 132 meq/L — ABNORMAL LOW (ref 135–145)
Total Bilirubin: 0.3 mg/dL (ref 0.3–1.2)

## 2012-10-10 LAB — COMPREHENSIVE METABOLIC PANEL
ALT: 18 U/L (ref 0–35)
CO2: 30 mEq/L (ref 19–32)
Calcium: 8.9 mg/dL (ref 8.4–10.5)
Chloride: 93 mEq/L — ABNORMAL LOW (ref 96–112)
Creat: 0.63 mg/dL (ref 0.50–1.10)
Total Protein: 5.8 g/dL — ABNORMAL LOW (ref 6.0–8.3)

## 2012-10-10 LAB — TSH: TSH: 1.304 u[IU]/mL (ref 0.350–4.500)

## 2012-10-10 NOTE — Addendum Note (Signed)
Addended by: Monica Becton on: 10/10/2012 08:39 AM   Modules accepted: Orders

## 2012-10-11 LAB — URINE CULTURE: Colony Count: 2000

## 2012-10-13 ENCOUNTER — Other Ambulatory Visit: Payer: Self-pay | Admitting: *Deleted

## 2012-10-13 DIAGNOSIS — R5383 Other fatigue: Secondary | ICD-10-CM

## 2012-10-14 ENCOUNTER — Other Ambulatory Visit: Payer: Self-pay | Admitting: *Deleted

## 2012-10-14 DIAGNOSIS — R5383 Other fatigue: Secondary | ICD-10-CM | POA: Diagnosis not present

## 2012-10-14 DIAGNOSIS — E785 Hyperlipidemia, unspecified: Secondary | ICD-10-CM | POA: Diagnosis not present

## 2012-10-14 DIAGNOSIS — I10 Essential (primary) hypertension: Secondary | ICD-10-CM | POA: Diagnosis not present

## 2012-10-14 NOTE — Progress Notes (Signed)
Opened in error..Jalayia Bagheri Lynetta  

## 2012-10-14 NOTE — Addendum Note (Signed)
Addended by: Deno Etienne on: 10/14/2012 04:35 PM   Modules accepted: Orders

## 2012-10-14 NOTE — Addendum Note (Signed)
Addended by: Deno Etienne on: 10/14/2012 04:31 PM   Modules accepted: Orders

## 2012-10-15 LAB — COMPLETE METABOLIC PANEL WITH GFR
ALT: 16 U/L (ref 0–35)
CO2: 27 mEq/L (ref 19–32)
Creat: 0.53 mg/dL (ref 0.50–1.10)
GFR, Est African American: >89 mL/min
GFR, Est Non African American: >89 mL/min
Total Bilirubin: 0.3 mg/dL (ref 0.3–1.2)

## 2012-10-15 LAB — LIPID PANEL
Cholesterol: 245 mg/dL — ABNORMAL HIGH (ref 0–200)
HDL: 77 mg/dL (ref >39)
Triglycerides: 101 mg/dL (ref <150)

## 2012-10-16 ENCOUNTER — Other Ambulatory Visit: Payer: Self-pay | Admitting: Family Medicine

## 2012-10-16 MED ORDER — PRAVASTATIN SODIUM 40 MG PO TABS: 40.0000 mg | ORAL_TABLET | Freq: Every day | ORAL | Status: DC

## 2012-10-24 ENCOUNTER — Ambulatory Visit (INDEPENDENT_AMBULATORY_CARE_PROVIDER_SITE_OTHER): Payer: Medicare Other | Admitting: Sports Medicine

## 2012-10-24 ENCOUNTER — Encounter: Payer: Self-pay | Admitting: Sports Medicine

## 2012-10-24 VITALS — BP 140/81 | HR 100 | Wt 102.0 lb

## 2012-10-24 DIAGNOSIS — R5381 Other malaise: Secondary | ICD-10-CM

## 2012-10-24 DIAGNOSIS — R5383 Other fatigue: Secondary | ICD-10-CM

## 2012-10-24 DIAGNOSIS — I10 Essential (primary) hypertension: Secondary | ICD-10-CM

## 2012-10-24 MED ORDER — AMLODIPINE BESY-BENAZEPRIL HCL 10-40 MG PO CAPS: 1.0000 | ORAL_CAPSULE | Freq: Every day | ORAL | Status: DC

## 2012-10-24 NOTE — Assessment & Plan Note (Signed)
I think some of her symptoms previously related to dehydration. She was fairly hyponatremic. I had her stop her Maxzide and her electrolytes returned to normal. She has a normal CBC, normal TSH. She endorses her mood has been good he denies any anxiety. I have nothing else to explain her fatigue and malaise.

## 2012-10-24 NOTE — Progress Notes (Signed)
  Subjective:    CC: Followup  HPI: This pleasant 72 year old female comes back to followup malaise. I saw her several weeks ago, her blood work showed hyponatremia and based on her symptoms she was likely hypovolemic. I had her stop her Maxzide, and her electrolytes returned to normal. Unfortunately she still feels somewhat tired and weak. Her TSH was normal, CBC normal, and she denies any anxiety or depression. Symptoms are moderate, stable.  Hypertension: Expectedly her blood pressure has gone up a little bit.  Past medical history, Surgical history, Family history not pertinant except as noted below, Social history, Allergies, and medications have been entered into the medical record, reviewed, and no changes needed.   Review of Systems: No fevers, chills, night sweats, weight loss, chest pain, or shortness of breath.   Objective:    General: Well Developed, well nourished, and in no acute distress.  Neuro: Alert and oriented x3, extra-ocular muscles intact, sensation grossly intact.  HEENT: Normocephalic, atraumatic, pupils equal round reactive to light, neck supple, no masses, no lymphadenopathy, thyroid nonpalpable.  Skin: Warm and dry, no rashes. Cardiac: Regular rate and rhythm, no murmurs rubs or gallops.  Respiratory: Clear to auscultation bilaterally. Not using accessory muscles, speaking in full sentences.  Impression and Recommendations:

## 2012-10-24 NOTE — Assessment & Plan Note (Signed)
I would like her to stay off of the Maxzide for now. I am going to increase her Lotrel to 10/40 mg. She will followup with her primary care physician in a month or so.

## 2012-11-21 ENCOUNTER — Ambulatory Visit (INDEPENDENT_AMBULATORY_CARE_PROVIDER_SITE_OTHER): Payer: Medicare Other

## 2012-11-21 ENCOUNTER — Encounter: Payer: Self-pay | Admitting: Family Medicine

## 2012-11-21 ENCOUNTER — Ambulatory Visit (INDEPENDENT_AMBULATORY_CARE_PROVIDER_SITE_OTHER): Payer: Medicare Other | Admitting: Family Medicine

## 2012-11-21 VITALS — BP 107/57 | HR 92 | Wt 100.0 lb

## 2012-11-21 DIAGNOSIS — R5381 Other malaise: Secondary | ICD-10-CM

## 2012-11-21 DIAGNOSIS — R6881 Early satiety: Secondary | ICD-10-CM

## 2012-11-21 DIAGNOSIS — R1013 Epigastric pain: Secondary | ICD-10-CM | POA: Diagnosis not present

## 2012-11-21 DIAGNOSIS — I10 Essential (primary) hypertension: Secondary | ICD-10-CM

## 2012-11-21 DIAGNOSIS — R5383 Other fatigue: Secondary | ICD-10-CM

## 2012-11-21 DIAGNOSIS — G8929 Other chronic pain: Secondary | ICD-10-CM

## 2012-11-21 DIAGNOSIS — R109 Unspecified abdominal pain: Secondary | ICD-10-CM

## 2012-11-21 DIAGNOSIS — R141 Gas pain: Secondary | ICD-10-CM | POA: Diagnosis not present

## 2012-11-21 DIAGNOSIS — R142 Eructation: Secondary | ICD-10-CM | POA: Diagnosis not present

## 2012-11-21 MED ORDER — AMLODIPINE BESY-BENAZEPRIL HCL 5-40 MG PO CAPS: 1.0000 | ORAL_CAPSULE | Freq: Every day | ORAL | Status: DC

## 2012-11-21 NOTE — Progress Notes (Signed)
Subjective:    Patient ID: Tamara Adams, female    DOB: 05/06/1941, 72 y.o.   MRN: [id number]  HPI HTN-  Pt denies chest pain, SOB, dizziness, or heart palpitations.  Taking meds as directed w/o problems.  Denies medication side effects.    Says has a line of pain across her belly button. Has already been to GI. Having spasm in her abdomen. She is losing some weight as well. Says this doesn't feel like an ulcer. Says feels like something "knots" like a muscle spams and then eases off after a few minutes.  Says bowels are moving normally on the magnesium citrate.    Review of Systems  BP 107/57  Pulse 92  Wt 100 lb (45.36 kg)  BMI 16.64 kg/m2    Allergies  Allergen Reactions  . Maxidex (Dexamethasone) Other (See Comments)    dehydration    Past Medical History  Diagnosis Date  . COPD (chronic obstructive pulmonary disease)     FVC 72%, FEV1 29%, FEV1 ratio 32% (very severe (COPD)    Past Surgical History  Procedure Laterality Date  . Abdominal hysterectomy  1987    and one ovary  . Oophorectomy  1992  . Cataract extraction w/phaco  06/12/2012    Dr. Almon Adams, left eye  . Cataract extraction  06/22/2012    Right eye, Dr. Zachary Adams.     History   Social History  . Marital Status: Married    Spouse Name: Tamara Adams    Number of Children: 3  . Years of Education: Tamara Adams   Occupational History  . retired.      Social History Main Topics  . Smoking status: Current Every Day Smoker -- 0.25 packs/day    Types: Cigarettes  . Smokeless tobacco: Not on file     Comment: Using Electronic cigarettes  . Alcohol Use: No  . Drug Use: No  . Sexually Active: Not on file   Other Topics Concern  . Not on file   Social History Narrative   No regular exercise.     Family History  Problem Relation Age of Onset  . Hypertension Mother   . Ovarian cancer Mother     Outpatient Encounter Prescriptions as of 11/21/2012  Medication Sig Dispense Refill  . albuterol (PROVENTIL) (2.5  MG/3ML) 0.083% nebulizer solution USE 1 VIAL VIA NEBULIZER 3 TIMES DAILY  270 vial  1  . albuterol (VENTOLIN HFA) 108 (90 BASE) MCG/ACT inhaler Inhale 2 puffs into the lungs every 6 (six) hours as needed. 2-4 puffs inhaled every 4-6 hours       . LORazepam (ATIVAN) 0.5 MG tablet Take 1 tablet (0.5 mg total) by mouth daily as needed for anxiety.  90 tablet  0  . pravastatin (PRAVACHOL) 40 MG tablet Take 1 tablet (40 mg total) by mouth at bedtime.  30 tablet  2  . QVAR 80 MCG/ACT inhaler INHALE 2 PUFFS INTO THE LUNGS 2 TIMES A DAY  3 g  0  . tiotropium (SPIRIVA HANDIHALER) 18 MCG inhalation capsule Use 2 inhalations from 1 capsule only once daily  90 capsule  1  . [DISCONTINUED] amLODipine-benazepril (LOTREL) 10-40 MG per capsule Take 1 capsule by mouth daily.  90 capsule  0  . amLODipine-benazepril (LOTREL) 5-40 MG per capsule Take 1 capsule by mouth daily.  30 capsule  3   No facility-administered encounter medications on file as of 11/21/2012.          Objective:   Physical Exam  Constitutional: She is oriented to person, place, and time. She appears well-developed and well-nourished.  HENT:  Head: Normocephalic and atraumatic.  Cardiovascular: Normal rate, regular rhythm and normal heart sounds.   Pulmonary/Chest: Effort normal and breath sounds normal.  Abdominal: Soft. Bowel sounds are normal. She exhibits no distension and no mass. There is no tenderness. There is no rebound and no guarding.  Musculoskeletal:  She does have a hypertrophied muscle to the left of the linea alba on the abdomen. It is nontender.  Neurological: She is alert and oriented to person, place, and time.  Skin: Skin is warm and dry.  Psychiatric: She has a normal mood and affect. Her behavior is normal.          Assessment & Plan:  HTN - Well controlled.  In fact low today.  Will dec her amlodipine dose.  F/U in 1 months for BPc heck.   Abdominal pain - Still unclear etiology. She has had a workup by GI.  They did an endoscopy as well as did some stomach biopsies. They also ordered a CT scan which she did have done and she reports was fairly normal. He just an enlargement of the abdominal wall muscle. We will call to get a copy of the CT results. Today we will get a KUB just to see if she has a lot of stool the bowel that might be causing some obstipation. Followup in one month for weight check.  Weight loss - unintentional weight loss. Still not sure what may be causing this though she has had decreased appetite so it may just be secondary to decreased caloric intake. She started drinking 2 boosts per day. She has had an endoscopy and a CT scan done by GI. They've ended stomach biopsies. She has been weaned off her PPI. I would like to check a sedimentation rate and CRP today. As well as repeat her CBC which was previously normal.

## 2012-11-22 LAB — CBC WITH DIFFERENTIAL/PLATELET
Basophils Absolute: 0 10*3/uL (ref 0.0–0.1)
Eosinophils Absolute: 0.1 10*3/uL (ref 0.0–0.7)
HCT: 40.2 % (ref 36.0–46.0)
Hemoglobin: 14 g/dL (ref 12.0–15.0)
Lymphs Abs: 1.6 10*3/uL (ref 0.7–4.0)
MCHC: 34.8 g/dL (ref 30.0–36.0)
Monocytes Absolute: 0.6 10*3/uL (ref 0.1–1.0)
Monocytes Relative: 10 % (ref 3–12)
Neutro Abs: 3.3 10*3/uL (ref 1.7–7.7)

## 2012-11-22 LAB — CK: Total CK: 146 U/L (ref 7–177)

## 2012-11-22 LAB — C-REACTIVE PROTEIN: CRP: <0.5 mg/dL (ref <0.60)

## 2012-11-24 NOTE — Progress Notes (Signed)
Quick Note:  All labs are normal. ______ 

## 2012-12-01 ENCOUNTER — Telehealth: Payer: Self-pay | Admitting: *Deleted

## 2012-12-01 ENCOUNTER — Encounter: Payer: Self-pay | Admitting: *Deleted

## 2012-12-01 NOTE — Telephone Encounter (Signed)
Left message with pt's daughter informing her that letter is up front for pt to p/u @ her convenience.Loralee Pacas [place]

## 2012-12-22 DIAGNOSIS — J449 Chronic obstructive pulmonary disease, unspecified: Secondary | ICD-10-CM | POA: Diagnosis not present

## 2012-12-22 DIAGNOSIS — K565 Intestinal adhesions [bands], unspecified as to partial versus complete obstruction: Secondary | ICD-10-CM | POA: Diagnosis present

## 2012-12-22 DIAGNOSIS — R110 Nausea: Secondary | ICD-10-CM | POA: Diagnosis not present

## 2012-12-22 DIAGNOSIS — Z4682 Encounter for fitting and adjustment of non-vascular catheter: Secondary | ICD-10-CM | POA: Diagnosis not present

## 2012-12-22 DIAGNOSIS — R1013 Epigastric pain: Secondary | ICD-10-CM | POA: Diagnosis not present

## 2012-12-22 DIAGNOSIS — K56609 Unspecified intestinal obstruction, unspecified as to partial versus complete obstruction: Secondary | ICD-10-CM | POA: Diagnosis not present

## 2012-12-22 DIAGNOSIS — R0902 Hypoxemia: Secondary | ICD-10-CM | POA: Diagnosis not present

## 2012-12-22 DIAGNOSIS — R109 Unspecified abdominal pain: Secondary | ICD-10-CM | POA: Diagnosis not present

## 2012-12-22 DIAGNOSIS — E785 Hyperlipidemia, unspecified: Secondary | ICD-10-CM | POA: Diagnosis present

## 2012-12-22 DIAGNOSIS — K429 Umbilical hernia without obstruction or gangrene: Secondary | ICD-10-CM | POA: Diagnosis not present

## 2012-12-22 DIAGNOSIS — Z781 Physical restraint status: Secondary | ICD-10-CM | POA: Diagnosis not present

## 2012-12-22 DIAGNOSIS — E871 Hypo-osmolality and hyponatremia: Secondary | ICD-10-CM | POA: Diagnosis not present

## 2012-12-22 DIAGNOSIS — J95821 Acute postprocedural respiratory failure: Secondary | ICD-10-CM | POA: Diagnosis not present

## 2012-12-22 DIAGNOSIS — F17200 Nicotine dependence, unspecified, uncomplicated: Secondary | ICD-10-CM | POA: Diagnosis present

## 2012-12-22 DIAGNOSIS — R7989 Other specified abnormal findings of blood chemistry: Secondary | ICD-10-CM | POA: Diagnosis not present

## 2012-12-22 DIAGNOSIS — Z5320 Procedure and treatment not carried out because of patient's decision for unspecified reasons: Secondary | ICD-10-CM | POA: Diagnosis not present

## 2012-12-22 DIAGNOSIS — D72829 Elevated white blood cell count, unspecified: Secondary | ICD-10-CM | POA: Diagnosis not present

## 2012-12-26 ENCOUNTER — Ambulatory Visit: Payer: Medicare Other | Admitting: Family Medicine

## 2012-12-30 ENCOUNTER — Other Ambulatory Visit: Payer: Self-pay | Admitting: *Deleted

## 2012-12-30 MED ORDER — LORAZEPAM 0.5 MG PO TABS: 0.5000 mg | ORAL_TABLET | Freq: Every day | ORAL | Status: DC | PRN

## 2012-12-30 MED ORDER — PRAVASTATIN SODIUM 40 MG PO TABS: 40.0000 mg | ORAL_TABLET | Freq: Every day | ORAL | Status: DC

## 2012-12-30 MED ORDER — AMLODIPINE BESY-BENAZEPRIL HCL 5-40 MG PO CAPS: 1.0000 | ORAL_CAPSULE | Freq: Every day | ORAL | Status: DC

## 2012-12-30 NOTE — Progress Notes (Signed)
[facility]

## 2013-01-10 ENCOUNTER — Other Ambulatory Visit: Payer: Self-pay | Admitting: Family Medicine

## 2013-01-13 DIAGNOSIS — K449 Diaphragmatic hernia without obstruction or gangrene: Secondary | ICD-10-CM | POA: Diagnosis not present

## 2013-01-13 DIAGNOSIS — K5900 Constipation, unspecified: Secondary | ICD-10-CM | POA: Diagnosis not present

## 2013-01-13 DIAGNOSIS — N289 Disorder of kidney and ureter, unspecified: Secondary | ICD-10-CM | POA: Diagnosis not present

## 2013-02-09 ENCOUNTER — Encounter: Payer: Self-pay | Admitting: Family Medicine

## 2013-02-09 ENCOUNTER — Ambulatory Visit (INDEPENDENT_AMBULATORY_CARE_PROVIDER_SITE_OTHER): Payer: Medicare Other | Admitting: Family Medicine

## 2013-02-09 VITALS — BP 151/66 | HR 90 | Temp 98.4°F | Wt 96.0 lb

## 2013-02-09 DIAGNOSIS — L0390 Cellulitis, unspecified: Secondary | ICD-10-CM

## 2013-02-09 DIAGNOSIS — L0291 Cutaneous abscess, unspecified: Secondary | ICD-10-CM | POA: Diagnosis not present

## 2013-02-09 MED ORDER — SULFAMETHOXAZOLE-TRIMETHOPRIM 800-160 MG PO TABS: ORAL_TABLET | ORAL | Status: AC

## 2013-02-09 NOTE — Progress Notes (Signed)
CC: Tamara Adams is a 72 y.o. female is here for bug bite on foot?   Subjective: HPI:  Patient complains of right foot pain and swelling localized to the medial dorsal region. Symptoms began on Saturday with itching hours later she noticed a raised bump since then swelling has slowly set in with surrounding redness all of the above described as mild in severity. It is slightly tender to the touch., No interventions as of yet. Nothing makes it better or worse other than above she's never had this before. It is present all hours of the day she denies fevers, chills, swollen lymph nodes, pain with weight bearing, nausea, vomiting, nor rapid heartbeat. She has had a shingles vaccine per her report   Review Of Systems Outlined In HPI  Past Medical History  Diagnosis Date  . COPD (chronic obstructive pulmonary disease)     FVC 72%, FEV1 29%, FEV1 ratio 32% (very severe (COPD)     Family History  Problem Relation Age of Onset  . Hypertension Mother   . Ovarian cancer Mother      History  Substance Use Topics  . Smoking status: Current Every Day Smoker -- 0.25 packs/day    Types: Cigarettes  . Smokeless tobacco: Not on file     Comment: Using Electronic cigarettes  . Alcohol Use: No     Objective: Filed Vitals:   02/09/13 1320  BP: 151/66  Pulse: 90  Temp: 98.4 F (36.9 C)    General: Alert and Oriented, No Acute Distress HEENT: Pupils equal, round, reactive to light. Conjunctivae clear.  Moist mucous membranes Lungs: Comfortable work of breathing Cardiac: Regular rate and rhythm.  Extremities: On the right medial foot dorsal aspect there is a 1 cm diameter slightly raised flesh-colored nodule with clear discharge from the center, this is surrounded by approximately 3 cm diameter erythema which is slightly warm and mildly swollen there is no induration or fluctuance. Strong peripheral pulses.  Mental Status: No depression, anxiety, nor agitation.   Assessment & Plan: Tamara Adams  was seen today for bug bite on foot?.  Diagnoses and associated orders for this visit:  Cellulitis - sulfamethoxazole-trimethoprim (SEPTRA DS) 800-160 MG per tablet; One by mouth twice a day for ten days.    Discussed with patient that likely she was bit by an insect jeopardizing integrity of the skin allowing skin bacteria to penetrate the skin therefore start Bactrim the perimeter of the erythema was outlined and patient instructed to return in 48 hours if not improving.Signs and symptoms requring emergent/urgent reevaluation were discussed with the patient.  Return if symptoms worsen or fail to improve.

## 2013-04-16 ENCOUNTER — Other Ambulatory Visit: Payer: Self-pay | Admitting: *Deleted

## 2013-04-16 MED ORDER — LORAZEPAM 0.5 MG PO TABS: 0.5000 mg | ORAL_TABLET | Freq: Every day | ORAL | Status: DC | PRN

## 2013-04-22 ENCOUNTER — Ambulatory Visit (INDEPENDENT_AMBULATORY_CARE_PROVIDER_SITE_OTHER): Payer: Medicare Other | Admitting: Family Medicine

## 2013-04-22 ENCOUNTER — Encounter: Payer: Self-pay | Admitting: Family Medicine

## 2013-04-22 ENCOUNTER — Telehealth: Payer: Self-pay | Admitting: *Deleted

## 2013-04-22 VITALS — BP 125/71 | HR 96 | Temp 98.0°F | Wt 97.0 lb

## 2013-04-22 DIAGNOSIS — I10 Essential (primary) hypertension: Secondary | ICD-10-CM

## 2013-04-22 DIAGNOSIS — K259 Gastric ulcer, unspecified as acute or chronic, without hemorrhage or perforation: Secondary | ICD-10-CM

## 2013-04-22 DIAGNOSIS — R319 Hematuria, unspecified: Secondary | ICD-10-CM

## 2013-04-22 DIAGNOSIS — N390 Urinary tract infection, site not specified: Secondary | ICD-10-CM

## 2013-04-22 LAB — POCT URINALYSIS DIPSTICK
Protein, UA: NEGATIVE
Spec Grav, UA: 1.015
Urobilinogen, UA: 0.2

## 2013-04-22 MED ORDER — TIOTROPIUM BROMIDE MONOHYDRATE 18 MCG IN CAPS: ORAL_CAPSULE | RESPIRATORY_TRACT | Status: DC

## 2013-04-22 MED ORDER — OMEPRAZOLE 40 MG PO CPDR: 40.0000 mg | DELAYED_RELEASE_CAPSULE | Freq: Every day | ORAL | Status: DC

## 2013-04-22 MED ORDER — AMLODIPINE BESY-BENAZEPRIL HCL 5-40 MG PO CAPS: 1.0000 | ORAL_CAPSULE | Freq: Every day | ORAL | Status: DC

## 2013-04-22 MED ORDER — CIPROFLOXACIN HCL 500 MG PO TABS: 500.0000 mg | ORAL_TABLET | Freq: Two times a day (BID) | ORAL | Status: AC

## 2013-04-22 MED ORDER — CIPROFLOXACIN HCL 500 MG PO TABS: 500.0000 mg | ORAL_TABLET | Freq: Two times a day (BID) | ORAL | Status: DC

## 2013-04-22 NOTE — Patient Instructions (Signed)
Urinary Tract Infection  Urinary tract infections (UTIs) can develop anywhere along your urinary tract. Your urinary tract is your body's drainage system for removing wastes and extra water. Your urinary tract includes two kidneys, two ureters, a bladder, and a urethra. Your kidneys are a pair of bean-shaped organs. Each kidney is about the size of your fist. They are located below your ribs, one on each side of your spine.  CAUSES  Infections are caused by microbes, which are microscopic organisms, including fungi, viruses, and bacteria. These organisms are so small that they can only be seen through a microscope. Bacteria are the microbes that most commonly cause UTIs.  SYMPTOMS   Symptoms of UTIs may vary by age and gender of the patient and by the location of the infection. Symptoms in young women typically include a frequent and intense urge to urinate and a painful, burning feeling in the bladder or urethra during urination. Older women and men are more likely to be tired, shaky, and weak and have muscle aches and abdominal pain. A fever may mean the infection is in your kidneys. Other symptoms of a kidney infection include pain in your back or sides below the ribs, nausea, and vomiting.  DIAGNOSIS  To diagnose a UTI, your caregiver will ask you about your symptoms. Your caregiver also will ask to provide a urine sample. The urine sample will be tested for bacteria and white blood cells. White blood cells are made by your body to help fight infection.  TREATMENT   Typically, UTIs can be treated with medication. Because most UTIs are caused by a bacterial infection, they usually can be treated with the use of antibiotics. The choice of antibiotic and length of treatment depend on your symptoms and the type of bacteria causing your infection.  HOME CARE INSTRUCTIONS   If you were prescribed antibiotics, take them exactly as your caregiver instructs you. Finish the medication even if you feel better after you  have only taken some of the medication.   Drink enough water and fluids to keep your urine clear or pale yellow.   Avoid caffeine, tea, and carbonated beverages. They tend to irritate your bladder.   Empty your bladder often. Avoid holding urine for long periods of time.   Empty your bladder before and after sexual intercourse.   After a bowel movement, women should cleanse from front to back. Use each tissue only once.  SEEK MEDICAL CARE IF:    You have back pain.   You develop a fever.   Your symptoms do not begin to resolve within 3 days.  SEEK IMMEDIATE MEDICAL CARE IF:    You have severe back pain or lower abdominal pain.   You develop chills.   You have nausea or vomiting.   You have continued burning or discomfort with urination.  MAKE SURE YOU:    Understand these instructions.   Will watch your condition.   Will get help right away if you are not doing well or get worse.  Document Released: 04/18/2005 Document Revised: 01/08/2012 Document Reviewed: 08/17/2011  [employer] Patient Information 2014 [employer].

## 2013-04-22 NOTE — Progress Notes (Signed)
Subjective:    Patient ID: Tamara Adams, female    DOB: February 24, 1941, 72 y.o.   MRN: [id number]  HPI UTI - sxs x 5-6 days back and flank pain. Having some urinary urgency and dysuria. No fever or chills.  No hematuria. Has been drinking cranbery juice. No recent catheterizations.  Low risk. Last UTI was several years ago. No recent antibiotics.  Hypertension-no chest pain or shortness of breath. She does need refills on her blood pressure pill sent to her mail order pharmacy.  Gastric ulcer-her estradiol just has been given her prescription for Prilosec which she has been getting a Insurance claims handler. She wants and if we can send prescription to her mail order pharmacy.  Review of Systems  BP 125/71  Pulse 96  Temp(Src) 98 F (36.7 C) (Oral)  Wt 97 lb (43.999 kg)  BMI 16.14 kg/m2    Allergies  Allergen Reactions  . Maxidex [Dexamethasone] Other (See Comments)    dehydration    Past Medical History  Diagnosis Date  . COPD (chronic obstructive pulmonary disease)     FVC 72%, FEV1 29%, FEV1 ratio 32% (very severe (COPD)    Past Surgical History  Procedure Laterality Date  . Abdominal hysterectomy  1987    and one ovary  . Oophorectomy  1992  . Cataract extraction w/phaco  06/12/2012    Dr. Almon Register, left eye  . Cataract extraction  06/22/2012    Right eye, Dr. Zachary George.     History   Social History  . Marital Status: Married    Spouse Name: N/A    Number of Children: 3  . Years of Education: N/A   Occupational History  . retired.      Social History Main Topics  . Smoking status: Current Every Day Smoker -- 0.25 packs/day    Types: Cigarettes  . Smokeless tobacco: Not on file     Comment: Using Electronic cigarettes  . Alcohol Use: No  . Drug Use: No  . Sexual Activity: Not on file   Other Topics Concern  . Not on file   Social History Narrative   No regular exercise.     Family History  Problem Relation Age of Onset  . Hypertension Mother   .  Ovarian cancer Mother     Outpatient Encounter Prescriptions as of 04/22/2013  Medication Sig Dispense Refill  . albuterol (PROVENTIL) (2.5 MG/3ML) 0.083% nebulizer solution USE 1 VIAL VIA NEBULIZER 3 TIMES DAILY  270 vial  1  . albuterol (VENTOLIN HFA) 108 (90 BASE) MCG/ACT inhaler Inhale 2 puffs into the lungs every 6 (six) hours as needed. 2-4 puffs inhaled every 4-6 hours       . amLODipine-benazepril (LOTREL) 5-40 MG per capsule Take 1 capsule by mouth daily.  90 capsule  1  . LORazepam (ATIVAN) 0.5 MG tablet Take 1 tablet (0.5 mg total) by mouth daily as needed for anxiety.  90 tablet  0  . pravastatin (PRAVACHOL) 40 MG tablet Take 1 tablet (40 mg total) by mouth at bedtime.  90 tablet  0  . QVAR 80 MCG/ACT inhaler INHALE 2 PUFFS INTO THE LUNGS 2 TIMES A DAY  3 g  1  . tiotropium (SPIRIVA HANDIHALER) 18 MCG inhalation capsule Use 2 inhalations from 1 capsule only once daily  90 capsule  3  . [DISCONTINUED] amLODipine-benazepril (LOTREL) 5-40 MG per capsule Take 1 capsule by mouth daily.  90 capsule  0  . [DISCONTINUED] tiotropium (SPIRIVA HANDIHALER) 18 MCG inhalation  capsule Use 2 inhalations from 1 capsule only once daily  90 capsule  1  . ciprofloxacin (CIPRO) 500 MG tablet Take 1 tablet (500 mg total) by mouth 2 (two) times daily.  10 tablet  0  . omeprazole (PRILOSEC) 40 MG capsule Take 1 capsule (40 mg total) by mouth daily. For GI ulcer  90 capsule  1  . [DISCONTINUED] ciprofloxacin (CIPRO) 500 MG tablet Take 1 tablet (500 mg total) by mouth 2 (two) times daily.  10 tablet  0   No facility-administered encounter medications on file as of 04/22/2013.          Objective:   Physical Exam  Constitutional: She appears well-developed and well-nourished.  HENT:  Head: Normocephalic and atraumatic.  Musculoskeletal:  NO CVA tenderness  Skin: Skin is warm and dry.  Psychiatric: She has a normal mood and affect. Her behavior is normal.          Assessment & Plan:  UTI - will  treat with Cipro. Drink plenty of fluids. If not better by Monday then please call the office back. Culture not sent as she is low risk.  Hypertension-well-controlled. Continue current regimen. Followup in 6 months.  Gastric ulcer-refill omeprazole and sent to her mail order pharmacy.

## 2013-04-22 NOTE — Progress Notes (Signed)
[facility] and spoke with [facility] asking her to cancel the order for Cipro that was sent. She informed me that it would take 24-48 hours to process this and that I would need to call back on either tomorrow or Friday to cancel otherwise this would place a hold on all of her meds that were in queue for process. I relayed this information to Dr. Metheney and will call back on tomorrow to try again.Tamara Adams [place] 

## 2013-04-22 NOTE — Telephone Encounter (Signed)
[facility] and spoke with Zella Ball asking her to cancel the order for Cipro that was sent. She informed me that it would take 24-48 hours to process this and that I would need to call back on either tomorrow or Friday to cancel otherwise this would place a hold on all of her meds that were in queue for process. I relayed this information to Dr. Linford Arnold and will call back on tomorrow to try again.Loralee Pacas [place]

## 2013-04-29 ENCOUNTER — Ambulatory Visit (INDEPENDENT_AMBULATORY_CARE_PROVIDER_SITE_OTHER): Payer: Medicare Other | Admitting: Physician Assistant

## 2013-04-29 ENCOUNTER — Ambulatory Visit (INDEPENDENT_AMBULATORY_CARE_PROVIDER_SITE_OTHER): Payer: Medicare Other

## 2013-04-29 ENCOUNTER — Encounter: Payer: Self-pay | Admitting: Physician Assistant

## 2013-04-29 VITALS — BP 152/71 | HR 82 | Wt 97.0 lb

## 2013-04-29 DIAGNOSIS — R109 Unspecified abdominal pain: Secondary | ICD-10-CM | POA: Diagnosis not present

## 2013-04-29 DIAGNOSIS — R319 Hematuria, unspecified: Secondary | ICD-10-CM

## 2013-04-29 LAB — POCT URINALYSIS DIPSTICK
Bilirubin, UA: NEGATIVE
Ketones, UA: 15
Leukocytes, UA: NEGATIVE
Protein, UA: >=300
Spec Grav, UA: 1.025
Urobilinogen, UA: 0.2

## 2013-04-29 MED ORDER — KETOROLAC TROMETHAMINE 30 MG/ML IJ SOLN
30.0000 mg | Freq: Once | INTRAMUSCULAR | Status: AC
  Administered 2013-04-29: 30 mg via INTRAMUSCULAR

## 2013-04-29 MED ORDER — TRAMADOL HCL 50 MG PO TABS: 50.0000 mg | ORAL_TABLET | Freq: Four times a day (QID) | ORAL | Status: DC | PRN

## 2013-04-29 MED ORDER — CYCLOBENZAPRINE HCL 5 MG PO TABS: 5.0000 mg | ORAL_TABLET | Freq: Three times a day (TID) | ORAL | Status: DC | PRN

## 2013-04-29 NOTE — Progress Notes (Signed)
  Subjective:    Patient ID: Tamara Adams, female    DOB: 06/26/1941, 72 y.o.   MRN: [id number]  HPI Patient is a 72 yo female who presents to the clinic with intense left flank pain since this morning. Pt was seen last week by Dr. Linford Arnold for UTI and treated with cipro. She felt much better and then all of a sudden after bending over this am her left kidney area began to throb. She has felt very nauseated but no vomiting. Denies any fever or chills. There is some discomfort when she urinates. She has had one episode of kidney stones in the past but not recently. She is also concerned because she had partial bowel obstruction earlier this year. She is taking ibuprofen and tylenol but not touching the pain.rates pain 7/10. Movement makes it worse. She denies lifting anything heavy or doing anything unusual.    sReview of Systems     Objective:   Physical Exam  Constitutional: She is oriented to person, place, and time. She appears well-developed and well-nourished.  HENT:  Head: Normocephalic and atraumatic.  Cardiovascular: Normal rate, regular rhythm and normal heart sounds.   Pulmonary/Chest: Effort normal and breath sounds normal.  No CVA tenderness.  Abdominal: Soft. Bowel sounds are normal. There is no tenderness.  Musculoskeletal:  ROM limited at the waist due to pain in left flank. There was pain with deep palpation.   Neurological: She is alert and oriented to person, place, and time.  Skin: Skin is warm and dry.  Psychiatric: She has a normal mood and affect. Her behavior is normal.          Assessment & Plan:  Left flank pain/nausea/dysuria- UA positive for blood but no leuks or nitrates. Sent for culture. CT with stone protocol done stat and was negative for any stones or bowel obstruction. Orginally I was highly suspicious of a stone. It seems like it is more muscular at this point. I gave her tramadol for pain to use alone with ibuprofen, sent flexeril to pharmacy(warned  of sedation), encouraged heat alternated with ice, discussed stretching. Call if not improving or if pain worsening. Pt declined any anti-nausea medication today.

## 2013-04-29 NOTE — Patient Instructions (Addendum)
Tramadol for pain. Stay hydrated. Will call with CT results.   Kidney Stones Kidney stones (ureteral lithiasis) are deposits that form inside your kidneys. The intense pain is caused by the stone moving through the urinary tract. When the stone moves, the ureter goes into spasm around the stone. The stone is usually passed in the urine.  CAUSES   A disorder that makes certain neck glands produce too much parathyroid hormone (primary hyperparathyroidism).  A buildup of uric acid crystals.  Narrowing (stricture) of the ureter.  A kidney obstruction present at birth (congenital obstruction).  Previous surgery on the kidney or ureters.  Numerous kidney infections. SYMPTOMS   Feeling sick to your stomach (nauseous).  Throwing up (vomiting).  Blood in the urine (hematuria).  Pain that usually spreads (radiates) to the groin.  Frequency or urgency of urination. DIAGNOSIS   Taking a history and physical exam.  Blood or urine tests.  Computerized X-ray scan (CT scan).  Occasionally, an examination of the inside of the urinary bladder (cystoscopy) is performed. TREATMENT   Observation.  Increasing your fluid intake.  Surgery may be needed if you have severe pain or persistent obstruction. The size, location, and chemical composition are all important variables that will determine the proper choice of action for you. Talk to your caregiver to better understand your situation so that you will minimize the risk of injury to yourself and your kidney.  HOME CARE INSTRUCTIONS   Drink enough water and fluids to keep your urine clear or pale yellow.  Strain all urine through the provided strainer. Keep all particulate matter and stones for your caregiver to see. The stone causing the pain may be as small as a grain of salt. It is very important to use the strainer each and every time you pass your urine. The collection of your stone will allow your caregiver to analyze it and verify  that a stone has actually passed.  Only take over-the-counter or prescription medicines for pain, discomfort, or fever as directed by your caregiver.  Make a follow-up appointment with your caregiver as directed.  Get follow-up X-rays if required. The absence of pain does not always mean that the stone has passed. It may have only stopped moving. If the urine remains completely obstructed, it can cause loss of kidney function or even complete destruction of the kidney. It is your responsibility to make sure X-rays and follow-ups are completed. Ultrasounds of the kidney can show blockages and the status of the kidney. Ultrasounds are not associated with any radiation and can be performed easily in a matter of minutes. SEEK IMMEDIATE MEDICAL CARE IF:   Pain cannot be controlled with the prescribed medicine.  You have a fever.  The severity or intensity of pain increases over 18 hours and is not relieved by pain medicine.  You develop a new onset of abdominal pain.  You feel faint or pass out. MAKE SURE YOU:   Understand these instructions.  Will watch your condition.  Will get help right away if you are not doing well or get worse. Document Released: 07/09/2005 Document Revised: 10/01/2011 Document Reviewed: 11/04/2009 [facility] Patient Information 2014 [place], [place].

## 2013-05-01 ENCOUNTER — Encounter: Payer: Self-pay | Admitting: Family Medicine

## 2013-05-01 ENCOUNTER — Ambulatory Visit (INDEPENDENT_AMBULATORY_CARE_PROVIDER_SITE_OTHER): Payer: Medicare Other | Admitting: Family Medicine

## 2013-05-01 VITALS — BP 155/75 | HR 96 | Temp 98.7°F | Wt 95.0 lb

## 2013-05-01 DIAGNOSIS — R110 Nausea: Secondary | ICD-10-CM

## 2013-05-01 DIAGNOSIS — R109 Unspecified abdominal pain: Secondary | ICD-10-CM

## 2013-05-01 DIAGNOSIS — R319 Hematuria, unspecified: Secondary | ICD-10-CM | POA: Diagnosis not present

## 2013-05-01 LAB — POCT URINALYSIS DIPSTICK
Glucose, UA: NEGATIVE
Leukocytes, UA: NEGATIVE
Nitrite, UA: NEGATIVE
Protein, UA: 100
Spec Grav, UA: 1.02
Urobilinogen, UA: 0.2

## 2013-05-01 MED ORDER — KETOROLAC TROMETHAMINE 60 MG/2ML IM SOLN
60.0000 mg | Freq: Once | INTRAMUSCULAR | Status: AC
  Administered 2013-05-01: 60 mg via INTRAMUSCULAR

## 2013-05-01 MED ORDER — LORAZEPAM 0.5 MG PO TABS: 0.5000 mg | ORAL_TABLET | Freq: Every day | ORAL | Status: DC | PRN

## 2013-05-01 MED ORDER — ONDANSETRON 4 MG PO TBDP: 4.0000 mg | ORAL_TABLET | Freq: Three times a day (TID) | ORAL | Status: DC | PRN

## 2013-05-01 MED ORDER — PROMETHAZINE HCL 12.5 MG PO TABS
50.0000 mg | ORAL_TABLET | Freq: Once | ORAL | Status: AC
  Administered 2013-05-01: 50 mg via ORAL

## 2013-05-01 MED ORDER — HYDROCODONE-ACETAMINOPHEN 10-325 MG PO TABS: 1.0000 | ORAL_TABLET | Freq: Four times a day (QID) | ORAL | Status: DC | PRN

## 2013-05-01 NOTE — Addendum Note (Signed)
Addended by: Deno Etienne on: 05/01/2013 06:08 PM   Modules accepted: Orders

## 2013-05-01 NOTE — Progress Notes (Signed)
  Subjective:    Patient ID: Tamara Adams, female    DOB: 10/16/1940, 72 y.o.   MRN: [id number]  HPI  pt was seen on 10/8 for this and 10/1 for uti she is not feeling any better. she stated that she is having cold sweats, n/v and the pain meds that she was given are not touching the pain. she also took zofran that her grandaughter had but that did not help. Very naueated, sweats. Pain is 10/10 today, over the left flank radiating to the LLQ.   Has been using tramadol with no relief and has been suing tylenol with no relief.  She still feels nauseated but has not vomited. She has not noticed any gross blood in the urine. She had a CT scan that was negative for any type of mass, diverticulitis, or stone. She has been straining her urine. No fever or chills.   Review of Systems     Objective:   Physical Exam  Constitutional: She is oriented to person, place, and time. She appears well-developed and well-nourished.  HENT:  Head: Normocephalic and atraumatic.  Cardiovascular: Normal rate, regular rhythm and normal heart sounds.   Pulmonary/Chest: Effort normal and breath sounds normal.  Abdominal: Soft. Bowel sounds are normal. She exhibits no distension and no mass. There is tenderness. There is no rebound and no guarding.  Tender over the posterior left flank as well as in the mid left lower quadrant. No palpable masses. No rash.  Neurological: She is alert and oriented to person, place, and time.  Skin: Skin is warm and dry.  Psychiatric: She has a normal mood and affect. Her behavior is normal.          Assessment & Plan:  Left flank pain-I still strongly suspect that this is a kidney stone. Based on her clinical history and symptoms and exam today. I'm going to give her stronger pain medication today and encouraged her to continue to strain her urine. If she has not passed the stone is not feeling better by Monday she is to call the office back. Urinalysis was normal today. And culture  for about 2 days ago still pending so we'll keep an eye on this. Given Toradol and Phenergan and check and for acute relief. Her granddaughter is here with her today to drive her home.

## 2013-05-01 NOTE — Patient Instructions (Signed)
Calm you Monday if you're feeling significantly better. Continue to strain her urine. Drink lots of water over the weekend.Kidney Stones Kidney stones (ureteral lithiasis) are deposits that form inside your kidneys. The intense pain is caused by the stone moving through the urinary tract. When the stone moves, the ureter goes into spasm around the stone. The stone is usually passed in the urine.  CAUSES   A disorder that makes certain neck glands produce too much parathyroid hormone (primary hyperparathyroidism).  A buildup of uric acid crystals.  Narrowing (stricture) of the ureter.  A kidney obstruction present at birth (congenital obstruction).  Previous surgery on the kidney or ureters.  Numerous kidney infections. SYMPTOMS   Feeling sick to your stomach (nauseous).  Throwing up (vomiting).  Blood in the urine (hematuria).  Pain that usually spreads (radiates) to the groin.  Frequency or urgency of urination. DIAGNOSIS   Taking a history and physical exam.  Blood or urine tests.  Computerized X-ray scan (CT scan).  Occasionally, an examination of the inside of the urinary bladder (cystoscopy) is performed. TREATMENT   Observation.  Increasing your fluid intake.  Surgery may be needed if you have severe pain or persistent obstruction. The size, location, and chemical composition are all important variables that will determine the proper choice of action for you. Talk to your caregiver to better understand your situation so that you will minimize the risk of injury to yourself and your kidney.  HOME CARE INSTRUCTIONS   Drink enough water and fluids to keep your urine clear or pale yellow.  Strain all urine through the provided strainer. Keep all particulate matter and stones for your caregiver to see. The stone causing the pain may be as small as a grain of salt. It is very important to use the strainer each and every time you pass your urine. The collection of your  stone will allow your caregiver to analyze it and verify that a stone has actually passed.  Only take over-the-counter or prescription medicines for pain, discomfort, or fever as directed by your caregiver.  Make a follow-up appointment with your caregiver as directed.  Get follow-up X-rays if required. The absence of pain does not always mean that the stone has passed. It may have only stopped moving. If the urine remains completely obstructed, it can cause loss of kidney function or even complete destruction of the kidney. It is your responsibility to make sure X-rays and follow-ups are completed. Ultrasounds of the kidney can show blockages and the status of the kidney. Ultrasounds are not associated with any radiation and can be performed easily in a matter of minutes. SEEK IMMEDIATE MEDICAL CARE IF:   Pain cannot be controlled with the prescribed medicine.  You have a fever.  The severity or intensity of pain increases over 18 hours and is not relieved by pain medicine.  You develop a new onset of abdominal pain.  You feel faint or pass out. MAKE SURE YOU:   Understand these instructions.  Will watch your condition.  Will get help right away if you are not doing well or get worse. Document Released: 07/09/2005 Document Revised: 10/01/2011 Document Reviewed: 11/04/2009 [facility] Patient Information 2014 [place], [place].

## 2013-05-02 LAB — URINE CULTURE: Colony Count: NO GROWTH

## 2013-05-03 LAB — URINE CULTURE
Colony Count: NO GROWTH
Organism ID, Bacteria: NO GROWTH

## 2013-05-04 ENCOUNTER — Other Ambulatory Visit: Payer: Self-pay | Admitting: Family Medicine

## 2013-05-04 ENCOUNTER — Telehealth: Payer: Self-pay

## 2013-05-04 DIAGNOSIS — N200 Calculus of kidney: Secondary | ICD-10-CM

## 2013-05-04 NOTE — Telephone Encounter (Signed)
Do not mix. She needs to either take one or the other at bedtime but not both.

## 2013-05-04 NOTE — Telephone Encounter (Signed)
Is it ok for patient to take Lorazepam and Hydrocodone. She wants to take the lorazepam at bedtime to help her sleep.

## 2013-05-04 NOTE — Telephone Encounter (Signed)
Patient advised.

## 2013-05-08 DIAGNOSIS — N3020 Other chronic cystitis without hematuria: Secondary | ICD-10-CM | POA: Diagnosis not present

## 2013-05-08 DIAGNOSIS — R109 Unspecified abdominal pain: Secondary | ICD-10-CM | POA: Diagnosis not present

## 2013-05-11 DIAGNOSIS — R300 Dysuria: Secondary | ICD-10-CM | POA: Diagnosis not present

## 2013-05-11 DIAGNOSIS — R109 Unspecified abdominal pain: Secondary | ICD-10-CM | POA: Diagnosis not present

## 2013-05-11 DIAGNOSIS — R319 Hematuria, unspecified: Secondary | ICD-10-CM | POA: Diagnosis not present

## 2013-05-11 DIAGNOSIS — N3020 Other chronic cystitis without hematuria: Secondary | ICD-10-CM | POA: Diagnosis not present

## 2013-05-11 DIAGNOSIS — R1032 Left lower quadrant pain: Secondary | ICD-10-CM | POA: Diagnosis not present

## 2013-05-12 DIAGNOSIS — R109 Unspecified abdominal pain: Secondary | ICD-10-CM | POA: Diagnosis not present

## 2013-05-12 DIAGNOSIS — R110 Nausea: Secondary | ICD-10-CM | POA: Diagnosis not present

## 2013-05-12 DIAGNOSIS — Z79899 Other long term (current) drug therapy: Secondary | ICD-10-CM | POA: Diagnosis not present

## 2013-05-12 DIAGNOSIS — J449 Chronic obstructive pulmonary disease, unspecified: Secondary | ICD-10-CM | POA: Diagnosis not present

## 2013-05-12 DIAGNOSIS — F17200 Nicotine dependence, unspecified, uncomplicated: Secondary | ICD-10-CM | POA: Diagnosis not present

## 2013-05-13 DIAGNOSIS — R319 Hematuria, unspecified: Secondary | ICD-10-CM | POA: Diagnosis not present

## 2013-05-13 DIAGNOSIS — R109 Unspecified abdominal pain: Secondary | ICD-10-CM | POA: Diagnosis not present

## 2013-05-15 ENCOUNTER — Ambulatory Visit (INDEPENDENT_AMBULATORY_CARE_PROVIDER_SITE_OTHER): Payer: Medicare Other | Admitting: Family Medicine

## 2013-05-15 ENCOUNTER — Other Ambulatory Visit: Payer: Self-pay | Admitting: Family Medicine

## 2013-05-15 ENCOUNTER — Encounter: Payer: Self-pay | Admitting: Family Medicine

## 2013-05-15 VITALS — BP 117/57 | HR 98 | Temp 98.2°F | Wt 92.0 lb

## 2013-05-15 DIAGNOSIS — R110 Nausea: Secondary | ICD-10-CM | POA: Diagnosis not present

## 2013-05-15 DIAGNOSIS — R1032 Left lower quadrant pain: Secondary | ICD-10-CM

## 2013-05-15 MED ORDER — GABAPENTIN 300 MG PO CAPS: 300.0000 mg | ORAL_CAPSULE | Freq: Every day | ORAL | Status: DC

## 2013-05-15 NOTE — Progress Notes (Signed)
CC: Tamara Adams is a 72 y.o. female is here for LLQ pain   Subjective: HPI:  Patient reports left lower quadrant pain has been present for 3 weeks it is described as severe in the morning typically awakening her from sleep it responds to hydrocodone which she only takes in the morning. Interestingly as the day goes on symptoms are drastically improved without any other interventions. Character and severity has not changed since onset 3 weeks ago, symptoms were most noticeable after she bent forward to attend to a family member. Symptoms were suddenly worse at that point and since then has been behaving above. Workup has included 2 CT scans which were unremarkable, complete blood count unremarkable, basic metabolic panel unremarkable and multiple urinalysis showing blood. Urine cultures have been negative. She has been seen by a urologist who has planned for pyelogram at the beginning November. She's been on Bactrim for 7 days now for presumed UTI with no improvement of pain. Urologist started her on Flagyl 2 days ago without any improvement of pain. She tells me that she is nauseous most hours of the day at his response to Phenergan she has had no vomiting. She has decreased appetite. She denies fevers, chills, night sweats, diarrhea, constipation. She reports having a bowel movement once a day without blood. She denies dysuria, urgency, frequency nor any genitourinary complaints.  The only thing new about her discomfort is numbness on the anterior proximal thigh which began earlier this week nothing particularly makes it better or worse it is constant  Review Of Systems Outlined In HPI  Past Medical History  Diagnosis Date  . COPD (chronic obstructive pulmonary disease)     FVC 72%, FEV1 29%, FEV1 ratio 32% (very severe (COPD)     Family History  Problem Relation Age of Onset  . Hypertension Mother   . Ovarian cancer Mother      History  Substance Use Topics  . Smoking status: Current Every  Day Smoker -- 0.25 packs/day    Types: Cigarettes  . Smokeless tobacco: Not on file     Comment: Using Electronic cigarettes  . Alcohol Use: No     Objective: Filed Vitals:   05/15/13 1311  BP: 117/57  Pulse: 98  Temp: 98.2 F (36.8 C)    General: Alert and Oriented, No Acute Distress HEENT: Pupils equal, round, reactive to light. Conjunctivae clear.  Moist mucous membranes pharynx unremarkable Lungs: Clear to auscultation bilaterally, no wheezing/ronchi/rales.  Comfortable work of breathing. Good air movement. Cardiac: Regular rate and rhythm. Normal S1/S2.  No murmurs, rubs, nor gallops.   Abdomen: Normal bowel sounds, soft and non tender without palpable masses. No rebound or guarding Back: No CVA tenderness, pain is slightly reproduced with palpation of paraspinal lumbar musculature near L3 and L2, there is no midline spinous process tenderness nor SI joint pain with direct palpation Extremities: No peripheral edema.  Strong peripheral pulses.  Full range of motion strength in both lower extremities L4 and S1 DTRs two over four bilaterally Mental Status: No depression, anxiety, nor agitation. Skin: Warm and dry.  Assessment & Plan: Tamara Adams was seen today for llq pain.  Diagnoses and associated orders for this visit:  Left lower quadrant pain - gabapentin (NEURONTIN) 300 MG capsule; Take 1 capsule (300 mg total) by mouth at bedtime. - COMPLETE METABOLIC PANEL WITH GFR - Sed Rate (ESR) - C-reactive protein - Ammonia  Nausea alone - gabapentin (NEURONTIN) 300 MG capsule; Take 1 capsule (300 mg total) by  mouth at bedtime. - COMPLETE METABOLIC PANEL WITH GFR - Sed Rate (ESR) - C-reactive protein - Ammonia    I would agree with continuing workup with urology. Checking CRP and ESR, I believe if these are not elevated this would strongly argue against any infectious or inflammatory process. Checking liver enzymes due to nausea along with ammonia.  Start low-dose Neurontin  for the small chance that her pain could be neuropathic.  Return if symptoms worsen or fail to improve.

## 2013-05-16 LAB — COMPLETE METABOLIC PANEL WITH GFR
AST: 27 U/L (ref 0–37)
Albumin: 4.2 g/dL (ref 3.5–5.2)
Alkaline Phosphatase: 60 U/L (ref 39–117)
BUN: 8 mg/dL (ref 6–23)
Calcium: 9.2 mg/dL (ref 8.4–10.5)
GFR, Est Non African American: 63 mL/min
Glucose, Bld: 80 mg/dL (ref 70–99)
Potassium: 4.2 mEq/L (ref 3.5–5.3)
Total Protein: 6.5 g/dL (ref 6.0–8.3)

## 2013-05-16 LAB — SEDIMENTATION RATE: Sed Rate: 1 mm/hr (ref 0–22)

## 2013-05-18 ENCOUNTER — Ambulatory Visit (INDEPENDENT_AMBULATORY_CARE_PROVIDER_SITE_OTHER): Payer: Medicare Other | Admitting: Physician Assistant

## 2013-05-18 ENCOUNTER — Telehealth: Payer: Self-pay | Admitting: *Deleted

## 2013-05-18 VITALS — BP 119/66 | HR 128 | Wt 91.0 lb

## 2013-05-18 DIAGNOSIS — G589 Mononeuropathy, unspecified: Secondary | ICD-10-CM | POA: Diagnosis not present

## 2013-05-18 DIAGNOSIS — M545 Low back pain: Secondary | ICD-10-CM | POA: Diagnosis not present

## 2013-05-18 DIAGNOSIS — G629 Polyneuropathy, unspecified: Secondary | ICD-10-CM

## 2013-05-18 DIAGNOSIS — R110 Nausea: Secondary | ICD-10-CM | POA: Diagnosis not present

## 2013-05-18 MED ORDER — KETOROLAC TROMETHAMINE 60 MG/2ML IM SOLN
60.0000 mg | Freq: Once | INTRAMUSCULAR | Status: AC
  Administered 2013-05-18: 60 mg via INTRAMUSCULAR

## 2013-05-18 MED ORDER — PREDNISONE 50 MG PO TABS: ORAL_TABLET | ORAL | Status: DC

## 2013-05-18 NOTE — Telephone Encounter (Signed)
Pt is still having left lower quadrant pain and is still not able to eat anything. Pt states she is down to 88lbs. Pt says the gabapentin that Dr. Ivan Anchors rx'ed didn't help with her pain at all. I did encourage pt to keep urology appt and F/u with Dr. Linford Arnold as Dr. Ivan Anchors suggested. Pt states her urology appt is not until next week and she doesn't know what else to do at this point. Pt wanted to let Dr. Linford Arnold know and wanted to now if she recommends anything

## 2013-05-18 NOTE — Telephone Encounter (Signed)
I think the prednisone Lesly Rubenstein gave her should help and may even help with appetite. Just make sure taking with a little bite of food.

## 2013-05-18 NOTE — Patient Instructions (Addendum)

## 2013-05-19 ENCOUNTER — Ambulatory Visit (INDEPENDENT_AMBULATORY_CARE_PROVIDER_SITE_OTHER): Payer: Medicare Other

## 2013-05-19 DIAGNOSIS — M4180 Other forms of scoliosis, site unspecified: Secondary | ICD-10-CM

## 2013-05-19 DIAGNOSIS — M4120 Other idiopathic scoliosis, site unspecified: Secondary | ICD-10-CM | POA: Diagnosis not present

## 2013-05-19 DIAGNOSIS — M4040 Postural lordosis, site unspecified: Secondary | ICD-10-CM

## 2013-05-19 DIAGNOSIS — M545 Low back pain: Secondary | ICD-10-CM

## 2013-05-19 DIAGNOSIS — R9389 Abnormal findings on diagnostic imaging of other specified body structures: Secondary | ICD-10-CM

## 2013-05-19 NOTE — Telephone Encounter (Signed)
See office visit from 05/18/13

## 2013-05-20 ENCOUNTER — Encounter: Payer: Self-pay | Admitting: Physician Assistant

## 2013-05-20 NOTE — Progress Notes (Signed)
  Subjective:    Patient ID: Tamara Adams, female    DOB: Apr 11, 1941, 72 y.o.   MRN: [id number]  HPI Patient is a 72 year old female who presents to the clinic with left lower back and flank pain. She has been seen by myself, Dr. Kary Kos, and Dr. Glade Lloyd in the last couple of months for this same pain. We strongly suspected kidney stones and treated as such. Patient was sent to urology they do not think his kidney stones but are doing some tests to rule out urinary obstruction. She is not having those tests at the beginning of November. Per patient she is in intense pain in her lower left back that radiates along her left flank. It is not worse with movement. The pain is constant. She is alternating between Norco, tramadol and ibuprofen. She has not really taken the Neurontin regularly. She does not like to take medications daily. She has tried muscle relaxers and they do not help. She denies any fever, chills, urinary symptoms. Tramadol helps minimally. She has had a CT scan which was negative for any acute process. Patient denies any saddle anesthesia, bowel or bladder dysfunction. She has had some numbness that occasionally will radiate down her left leg on the front side.   Review of Systems     Objective:   Physical Exam  Musculoskeletal:  No pain with palpation over complete spine. Range of motion not limited by pain and alterations at waist. There is no pinpoint pain over the paraspinous muscles around lumbar spine. Negative for CVA tenderness. Negative straight leg test bilaterally. Strength of bilateral legs 5 out of 5. Patellar reflexes 2+ and symmetric.          Assessment & Plan:  Left flank pain/left low back pain- at this point am concerned that this pain could be coming from her back. Although there is no tenderness with palpation over the spine. I will get x-rays of lumbar spine. Will also give a five-day course of prednisone. A shot of Toradol 60 mg was given in office today.  Patient was also given stretches to work on couple times that day. Continue use and muscle relaxers and pain medication as needed. Consider warm compresses on low back. I would strongly urge you to try neurontin to see if medication for nerve pain could help symptoms. We will be looking for results of urologist testing in early November. Followup as needed.   Spent 30 minutes with patient and greater than 50 percent of visit spent counseling about plan for pain.

## 2013-05-25 ENCOUNTER — Telehealth: Payer: Self-pay | Admitting: *Deleted

## 2013-05-25 NOTE — Telephone Encounter (Signed)
I would like her to see our sports med doc and see what he would recommend for her back. Also consider trying the neurontin. It i snot a narcotic.

## 2013-05-25 NOTE — Telephone Encounter (Signed)
Pt states now that she has finished the Prednisone Jade gave her the pain is back. Pt states she doesn't want to take the pain medication she was given for her pain. Please advise.  Meyer Cory, LPN

## 2013-05-26 NOTE — Telephone Encounter (Signed)
Pt has been scheduled with Dr. Karie Schwalbe on Thursday afternoon. Pt takes Gabapentin 300mg  QHS. Please advise if you want to increase her dose.  Meyer Cory, LPN

## 2013-05-26 NOTE — Telephone Encounter (Signed)
Pt states she is also having some diarrhea and abd pain. Please advise.  Meyer Cory, LPN

## 2013-05-26 NOTE — Telephone Encounter (Signed)
How long having diarrhea?  Is pain still in LLQ?  We can check stool for c diff and do stool culture.  Has she tried immodium ?

## 2013-05-27 ENCOUNTER — Other Ambulatory Visit: Payer: Self-pay | Admitting: Family Medicine

## 2013-05-27 MED ORDER — PREDNISONE 50 MG PO TABS: ORAL_TABLET | ORAL | Status: DC

## 2013-05-27 NOTE — Telephone Encounter (Signed)
LMOM for pt or family member to return call.  Meyer Cory, LPN

## 2013-05-27 NOTE — Telephone Encounter (Signed)
Spoke w/kelly pt's granddaughter and she reports that pt's diarrhea has subsided since yesterday severe L sided pain and nausea and phenagren helps some however pt is just lying around she only responded well when she was on steroids. She did not take the immodium. I spoke with Dr.Metheney and she advised that she keep her appt with Dr. Karie Schwalbe tomorrow and she is going to call in another rx for prednisone and if she does not seem to get better with this she will refer to GI. She voiced understanding and agreed.Loralee Pacas [place]

## 2013-05-28 ENCOUNTER — Ambulatory Visit (INDEPENDENT_AMBULATORY_CARE_PROVIDER_SITE_OTHER): Payer: Medicare Other | Admitting: Sports Medicine

## 2013-05-28 ENCOUNTER — Encounter: Payer: Self-pay | Admitting: Sports Medicine

## 2013-05-28 VITALS — BP 136/72 | HR 86 | Wt 92.0 lb

## 2013-05-28 DIAGNOSIS — M51369 Other intervertebral disc degeneration, lumbar region without mention of lumbar back pain or lower extremity pain: Secondary | ICD-10-CM | POA: Insufficient documentation

## 2013-05-28 DIAGNOSIS — M5137 Other intervertebral disc degeneration, lumbosacral region: Secondary | ICD-10-CM | POA: Diagnosis not present

## 2013-05-28 DIAGNOSIS — M51379 Other intervertebral disc degeneration, lumbosacral region without mention of lumbar back pain or lower extremity pain: Secondary | ICD-10-CM

## 2013-05-28 DIAGNOSIS — M5136 Other intervertebral disc degeneration, lumbar region: Secondary | ICD-10-CM | POA: Insufficient documentation

## 2013-05-28 NOTE — Assessment & Plan Note (Signed)
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 We will start conservatively with oral prednisone which she has already, formal physical therapy, I would like an MRI of her lumbar spine as I do think we will be proceeding to intervention. I like to see her back to go over results of the MRI.

## 2013-05-28 NOTE — Progress Notes (Signed)
   Subjective:    I'm seeing this patient as a consultation for:  Tamara Gaw, PA-C, Dr. Nani Gasser  CC: Back pain  HPI: This is a very pleasant 72 year old female, she's had a several month history of pain in her low and mid back, radiating around to the left side of her lower abdomen, anterior upper thigh, and anterior mid thigh on the left side. She describes this as a numbness type sensation. She's had an extensive urologic workup, it has been fairly fruitless. She did have some hematuria and a renal cyst, both of which are unlikely to cause her pain. It is really not worse with Valsalva, sitting, but it does hurt when going from sitting to standing. She denies any new bowel or bladder dysfunction. Pain is moderate, persistent. She did have sciatica in the distant past and physical therapy was very effective.  Past medical history, Surgical history, Family history not pertinant except as noted below, Social history, Allergies, and medications have been entered into the medical record, reviewed, and no changes needed.   Review of Systems: No headache, visual changes, nausea, vomiting, diarrhea, constipation, dizziness, abdominal pain, skin rash, fevers, chills, night sweats, weight loss, swollen lymph nodes, body aches, joint swelling, muscle aches, chest pain, shortness of breath, mood changes, visual or auditory hallucinations.   Objective:   General: Well Developed, well nourished, and in no acute distress.  Neuro/Psych: Alert and oriented x3, extra-ocular muscles intact, able to move all 4 extremities, sensation grossly intact. Skin: Warm and dry, no rashes noted.  Respiratory: Not using accessory muscles, speaking in full sentences, trachea midline.  Cardiovascular: Pulses palpable, no extremity edema. Abdomen: Does not appear distended. Back Exam:  Inspection: Unremarkable  Motion: Flexion 45 deg, Extension 45 deg, Side Bending to 45 deg bilaterally,  Rotation to 45 deg  bilaterally  SLR laying: Negative  XSLR laying: Negative  Palpable tenderness: None. FABER: negative. Sensory change: Gross sensation intact to all lumbar and sacral dermatomes.  Reflexes: 2+ at both patellar tendons, 2+ at achilles tendons, Babinski's downgoing.  Strength at foot  Plantar-flexion: 5/5 Dorsi-flexion: 5/5 Eversion: 5/5 Inversion: 5/5  Leg strength  Quad: 5/5 Hamstring: 5/5 Hip flexor: 5/5 Hip abductors: 5/5  Gait unremarkable.  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  Impression and Recommendations:   This case required medical decision making of moderate complexity.

## 2013-05-29 DIAGNOSIS — R109 Unspecified abdominal pain: Secondary | ICD-10-CM | POA: Diagnosis not present

## 2013-05-29 DIAGNOSIS — F17200 Nicotine dependence, unspecified, uncomplicated: Secondary | ICD-10-CM | POA: Diagnosis not present

## 2013-05-29 DIAGNOSIS — Z79899 Other long term (current) drug therapy: Secondary | ICD-10-CM | POA: Diagnosis not present

## 2013-05-29 DIAGNOSIS — N3020 Other chronic cystitis without hematuria: Secondary | ICD-10-CM | POA: Diagnosis not present

## 2013-05-29 DIAGNOSIS — Z86718 Personal history of other venous thrombosis and embolism: Secondary | ICD-10-CM | POA: Diagnosis not present

## 2013-05-29 DIAGNOSIS — I10 Essential (primary) hypertension: Secondary | ICD-10-CM | POA: Diagnosis not present

## 2013-05-29 DIAGNOSIS — Z87442 Personal history of urinary calculi: Secondary | ICD-10-CM | POA: Diagnosis not present

## 2013-05-29 DIAGNOSIS — N3090 Cystitis, unspecified without hematuria: Secondary | ICD-10-CM | POA: Diagnosis not present

## 2013-05-29 DIAGNOSIS — J449 Chronic obstructive pulmonary disease, unspecified: Secondary | ICD-10-CM | POA: Diagnosis not present

## 2013-05-29 DIAGNOSIS — R319 Hematuria, unspecified: Secondary | ICD-10-CM | POA: Diagnosis not present

## 2013-05-29 DIAGNOSIS — R300 Dysuria: Secondary | ICD-10-CM | POA: Diagnosis not present

## 2013-06-01 ENCOUNTER — Other Ambulatory Visit: Payer: Self-pay

## 2013-06-01 ENCOUNTER — Ambulatory Visit (INDEPENDENT_AMBULATORY_CARE_PROVIDER_SITE_OTHER): Payer: Medicare Other | Admitting: Family Medicine

## 2013-06-01 DIAGNOSIS — Z23 Encounter for immunization: Secondary | ICD-10-CM | POA: Diagnosis not present

## 2013-06-01 MED ORDER — PREDNISONE (PAK) 10 MG PO TABS: ORAL_TABLET | ORAL | Status: DC

## 2013-06-01 NOTE — Progress Notes (Signed)
Pt given flu immunization today. She tolerated it well.Heath Gold'

## 2013-06-01 NOTE — Telephone Encounter (Signed)
This refill is not entirely appropriate, prednisone is supposed to be short-term only. I will add a taper, but she needs to get the MRI, and followup with me for consideration of epidural injections. She also needs to do some physical therapy as we discussed previously.

## 2013-06-01 NOTE — Telephone Encounter (Signed)
Tamara Adams needs a refill on her prednisone. Is this appropriate?

## 2013-06-05 ENCOUNTER — Ambulatory Visit: Payer: Medicare Other | Admitting: Physical Therapy

## 2013-06-05 ENCOUNTER — Telehealth: Payer: Self-pay | Admitting: *Deleted

## 2013-06-05 DIAGNOSIS — M810 Age-related osteoporosis without current pathological fracture: Secondary | ICD-10-CM

## 2013-06-05 DIAGNOSIS — M6281 Muscle weakness (generalized): Secondary | ICD-10-CM

## 2013-06-05 DIAGNOSIS — E559 Vitamin D deficiency, unspecified: Secondary | ICD-10-CM

## 2013-06-05 DIAGNOSIS — R293 Abnormal posture: Secondary | ICD-10-CM

## 2013-06-05 DIAGNOSIS — M545 Low back pain: Secondary | ICD-10-CM

## 2013-06-05 DIAGNOSIS — M5137 Other intervertebral disc degeneration, lumbosacral region: Secondary | ICD-10-CM

## 2013-06-05 NOTE — Telephone Encounter (Signed)
Pt states the PT wants her to have a bone density done.

## 2013-06-09 ENCOUNTER — Ambulatory Visit (INDEPENDENT_AMBULATORY_CARE_PROVIDER_SITE_OTHER): Payer: Medicare Other

## 2013-06-09 DIAGNOSIS — M47817 Spondylosis without myelopathy or radiculopathy, lumbosacral region: Secondary | ICD-10-CM

## 2013-06-09 DIAGNOSIS — M48061 Spinal stenosis, lumbar region without neurogenic claudication: Secondary | ICD-10-CM

## 2013-06-09 DIAGNOSIS — M5136 Other intervertebral disc degeneration, lumbar region: Secondary | ICD-10-CM

## 2013-06-09 DIAGNOSIS — M5380 Other specified dorsopathies, site unspecified: Secondary | ICD-10-CM

## 2013-06-10 ENCOUNTER — Encounter: Payer: Medicare Other | Admitting: Physical Therapy

## 2013-06-10 DIAGNOSIS — M6281 Muscle weakness (generalized): Secondary | ICD-10-CM

## 2013-06-10 DIAGNOSIS — M545 Low back pain: Secondary | ICD-10-CM

## 2013-06-10 DIAGNOSIS — M5137 Other intervertebral disc degeneration, lumbosacral region: Secondary | ICD-10-CM

## 2013-06-11 ENCOUNTER — Other Ambulatory Visit: Payer: Self-pay | Admitting: Family Medicine

## 2013-06-12 ENCOUNTER — Encounter: Payer: Medicare Other | Admitting: Physical Therapy

## 2013-06-12 DIAGNOSIS — M6281 Muscle weakness (generalized): Secondary | ICD-10-CM

## 2013-06-12 DIAGNOSIS — M5137 Other intervertebral disc degeneration, lumbosacral region: Secondary | ICD-10-CM

## 2013-06-12 DIAGNOSIS — R293 Abnormal posture: Secondary | ICD-10-CM

## 2013-06-12 DIAGNOSIS — M545 Low back pain: Secondary | ICD-10-CM

## 2013-06-15 ENCOUNTER — Encounter: Payer: Medicare Other | Admitting: Physical Therapy

## 2013-06-16 ENCOUNTER — Ambulatory Visit (INDEPENDENT_AMBULATORY_CARE_PROVIDER_SITE_OTHER): Payer: Medicare Other | Admitting: Sports Medicine

## 2013-06-16 ENCOUNTER — Encounter: Payer: Self-pay | Admitting: Sports Medicine

## 2013-06-16 VITALS — BP 151/77 | HR 97 | Wt 91.0 lb

## 2013-06-16 DIAGNOSIS — M51369 Other intervertebral disc degeneration, lumbar region without mention of lumbar back pain or lower extremity pain: Secondary | ICD-10-CM

## 2013-06-16 DIAGNOSIS — M51379 Other intervertebral disc degeneration, lumbosacral region without mention of lumbar back pain or lower extremity pain: Secondary | ICD-10-CM | POA: Diagnosis not present

## 2013-06-16 DIAGNOSIS — M5137 Other intervertebral disc degeneration, lumbosacral region: Secondary | ICD-10-CM

## 2013-06-16 DIAGNOSIS — M5136 Other intervertebral disc degeneration, lumbar region: Secondary | ICD-10-CM

## 2013-06-16 NOTE — Assessment & Plan Note (Signed)
Multilevel degenerative disc disease, only 50% improvement so far with physical therapy, NSAIDs, muscle relaxers. She does have a fantastic response when on steroids. Symptoms are referable to the left L2 nerve root as well as the left L4 versus L5 nerve root. MRI does show a large disc protrusion at the L2-L3 level as well as spondylolisthesis of L4 on L5. At this point we are going to proceed with a left-sided L2-L3 interlaminar epidural steroid injection and a left-sided L4-L5 interlaminar epidural steroid injection, medication can be split between the 2 sites. I would like to see her back one week after the injections.

## 2013-06-16 NOTE — Progress Notes (Signed)
  Subjective:    CC: Followup  HPI: Lumbar degenerative disc disease: Excellent response to oral steroids, but now only 50% improved after physical therapy, NSAIDs. Pain is referrable to the left L2 as well as L4 versus L5 nerve roots with pain over the left anterior upper thigh as well as traveling down the anterolateral aspect of the left thigh as well. Symptoms are worsening, she does desire further treatment.  Past medical history, Surgical history, Family history not pertinant except as noted below, Social history, Allergies, and medications have been entered into the medical record, reviewed, and no changes needed.   Review of Systems: No fevers, chills, night sweats, weight loss, chest pain, or shortness of breath.   Objective:    General: Well Developed, well nourished, and in no acute distress.  Neuro: Alert and oriented x3, extra-ocular muscles intact, sensation grossly intact.  HEENT: Normocephalic, atraumatic, pupils equal round reactive to light, neck supple, no masses, no lymphadenopathy, thyroid nonpalpable.  Skin: Warm and dry, no rashes. Cardiac: Regular rate and rhythm, no murmurs rubs or gallops, no lower extremity edema.  Respiratory: Clear to auscultation bilaterally. Not using accessory muscles, speaking in full sentences.  Impression and Recommendations:

## 2013-06-22 ENCOUNTER — Ambulatory Visit
Admission: RE | Admit: 2013-06-22 | Discharge: 2013-06-22 | Disposition: A | Discharge status: Home / Self Care | Payer: Medicare Other | Source: Ambulatory Visit | Attending: Sports Medicine | Admitting: Sports Medicine

## 2013-06-22 ENCOUNTER — Other Ambulatory Visit: Payer: Self-pay | Admitting: Sports Medicine

## 2013-06-22 VITALS — BP 144/76 | HR 91

## 2013-06-22 DIAGNOSIS — M5136 Other intervertebral disc degeneration, lumbar region: Secondary | ICD-10-CM

## 2013-06-22 MED ORDER — METHYLPREDNISOLONE ACETATE 40 MG/ML INJ SUSP (RADIOLOG
120.0000 mg | Freq: Once | INTRAMUSCULAR | Status: AC
  Administered 2013-06-22: 120 mg via EPIDURAL

## 2013-06-22 MED ORDER — IOHEXOL 180 MG/ML  SOLN
1.0000 mL | Freq: Once | INTRAMUSCULAR | Status: AC | PRN
  Administered 2013-06-22: 1 mL via EPIDURAL

## 2013-06-23 ENCOUNTER — Ambulatory Visit (INDEPENDENT_AMBULATORY_CARE_PROVIDER_SITE_OTHER): Payer: Medicare Other

## 2013-06-23 ENCOUNTER — Encounter: Payer: Medicare Other | Admitting: Physical Therapy

## 2013-06-23 DIAGNOSIS — M948X9 Other specified disorders of cartilage, unspecified sites: Secondary | ICD-10-CM

## 2013-06-23 DIAGNOSIS — M810 Age-related osteoporosis without current pathological fracture: Secondary | ICD-10-CM

## 2013-06-24 ENCOUNTER — Other Ambulatory Visit: Payer: Self-pay | Admitting: Family Medicine

## 2013-06-24 MED ORDER — ALENDRONATE SODIUM 70 MG PO TABS: 70.0000 mg | ORAL_TABLET | ORAL | Status: DC

## 2013-06-29 ENCOUNTER — Ambulatory Visit (INDEPENDENT_AMBULATORY_CARE_PROVIDER_SITE_OTHER): Payer: Medicare Other | Admitting: Sports Medicine

## 2013-06-29 ENCOUNTER — Encounter: Payer: Self-pay | Admitting: Sports Medicine

## 2013-06-29 VITALS — BP 141/70 | HR 91 | Wt 91.0 lb

## 2013-06-29 DIAGNOSIS — M51379 Other intervertebral disc degeneration, lumbosacral region without mention of lumbar back pain or lower extremity pain: Secondary | ICD-10-CM | POA: Diagnosis not present

## 2013-06-29 DIAGNOSIS — M5137 Other intervertebral disc degeneration, lumbosacral region: Secondary | ICD-10-CM

## 2013-06-29 DIAGNOSIS — M5136 Other intervertebral disc degeneration, lumbar region: Secondary | ICD-10-CM

## 2013-06-29 DIAGNOSIS — M51369 Other intervertebral disc degeneration, lumbar region without mention of lumbar back pain or lower extremity pain: Secondary | ICD-10-CM

## 2013-06-29 NOTE — Assessment & Plan Note (Signed)
Left anterior thigh pain is now completely resolved after an L2-L3 interlaminar epidural steroid injection. Unfortunately the L4-L5 level was unable to be accessed due to degenerative changes, and the injection was performed at the L5-S1 level. She doing very well, and I have advised her to come back in one month, at that point I will repeat an L2-L3 interlaminar if needed, and certainly we can try transforaminal at the L4-L5 level.

## 2013-06-29 NOTE — Progress Notes (Signed)
  Subjective:    CC: Followup  HPI: Tamara Adams comes back to see me, she had left-sided radicular symptoms referable to both the left L2 as well as the left L4 nerve root. She had an interlaminar epidural a left-sided the L2-L3 level, and a left-sided L5-S1 interlaminar injection, the interventional radiologist was unable to access the L4-L5 space from an interlaminar approach. She returns with left anterior thigh pain completely resolved, unfortunately she continues to have some lower extremity radicular symptoms. Her axial pain has also improved significantly. She does not yet desire any further epidurals, but does tell me she will let me know in a month whether she wants to repeat.  Past medical history, Surgical history, Family history not pertinant except as noted below, Social history, Allergies, and medications have been entered into the medical record, reviewed, and no changes needed.   Review of Systems: No fevers, chills, night sweats, weight loss, chest pain, or shortness of breath.   Objective:    General: Well Developed, well nourished, and in no acute distress.  Neuro: Alert and oriented x3, extra-ocular muscles intact, sensation grossly intact.  HEENT: Normocephalic, atraumatic, pupils equal round reactive to light, neck supple, no masses, no lymphadenopathy, thyroid nonpalpable.  Skin: Warm and dry, no rashes. Cardiac: Regular rate and rhythm, no murmurs rubs or gallops, no lower extremity edema.  Respiratory: Clear to auscultation bilaterally. Not using accessory muscles, speaking in full sentences. Back Exam:  Inspection: Unremarkable  Motion: Flexion 45 deg, Extension 45 deg, Side Bending to 45 deg bilaterally,  Rotation to 45 deg bilaterally  SLR laying: Negative  XSLR laying: Negative  Palpable tenderness: None. FABER: negative. Sensory change: Gross sensation intact to all lumbar and sacral dermatomes.  Reflexes: 2+ at both patellar tendons, 2+ at achilles tendons,  Babinski's downgoing.  Strength at foot  Plantar-flexion: 5/5 Dorsi-flexion: 5/5 Eversion: 5/5 Inversion: 5/5  Leg strength  Quad: 5/5 Hamstring: 5/5 Hip flexor: 5/5 Hip abductors: 5/5  Gait unremarkable.  Impression and Recommendations:

## 2013-07-01 ENCOUNTER — Encounter: Payer: Self-pay | Admitting: Family Medicine

## 2013-07-01 ENCOUNTER — Ambulatory Visit (INDEPENDENT_AMBULATORY_CARE_PROVIDER_SITE_OTHER): Payer: Medicare Other | Admitting: Family Medicine

## 2013-07-01 VITALS — BP 143/66 | HR 94 | Resp 16 | Wt 90.0 lb

## 2013-07-01 DIAGNOSIS — R1011 Right upper quadrant pain: Secondary | ICD-10-CM

## 2013-07-01 DIAGNOSIS — R112 Nausea with vomiting, unspecified: Secondary | ICD-10-CM

## 2013-07-01 MED ORDER — LORAZEPAM 1 MG PO TABS: 1.0000 mg | ORAL_TABLET | Freq: Every day | ORAL | Status: DC | PRN

## 2013-07-01 NOTE — Progress Notes (Signed)
   Subjective:    Patient ID: Tamara Adams, female    DOB: 06-Mar-1941, 72 y.o.   MRN: [id number]  HPI RUQ on and off x 3 weeks. Feels like a tightness. Feels really tired, shakey, nauseated.  Has vomited a few times. Hx of GI ulcer. Went to digestive health on and off for about 1.5 year and they kept telling her it was her ulcer but took all her meds for this for 1.5 years with no relief.  Worse after eating.  Says feels a little better in the AM. No relieving symptoms. She did have a CT scan of the abdomen done approximately 3 months ago which did not show any stones or inflammation the gallbladder. Her weight is down 25 pounds in the last 2 years.   Review of Systems     Objective:   Physical Exam  Constitutional: She is oriented to person, place, and time. She appears well-developed and well-nourished.  HENT:  Head: Normocephalic and atraumatic.  Cardiovascular: Normal rate, regular rhythm and normal heart sounds.   Pulmonary/Chest: Effort normal and breath sounds normal.  Abdominal: Soft. Bowel sounds are normal. She exhibits no distension and no mass. There is tenderness. There is no rebound and no guarding.  Tender in the right upper quadrant just along the rib line.  Neurological: She is alert and oriented to person, place, and time.  Skin: Skin is warm and dry.  Psychiatric: She has a normal mood and affect. Her behavior is normal.          Assessment & Plan:  Cholecystitis - we discussed option of getting a HIDA scan to look at the puncture the gallbladder or considering having the gallbladder removed. She's at that point where she just wants to have her gallbladder taken out and see if she improves. She says she's tired of living this way and has been going on for 3 years. Refer for surgery consult. I did let her know that they may request a HIDA scan before surgery but they may not. Ultimately it up to them to decide. I do think her symptoms are quite classic for gallbladder I  do think she would benefit from removal.

## 2013-07-09 ENCOUNTER — Encounter (INDEPENDENT_AMBULATORY_CARE_PROVIDER_SITE_OTHER): Payer: Self-pay | Admitting: Surgery

## 2013-07-09 ENCOUNTER — Other Ambulatory Visit (INDEPENDENT_AMBULATORY_CARE_PROVIDER_SITE_OTHER): Payer: Self-pay | Admitting: Surgery

## 2013-07-09 ENCOUNTER — Ambulatory Visit (INDEPENDENT_AMBULATORY_CARE_PROVIDER_SITE_OTHER): Payer: Medicare Other | Admitting: Surgery

## 2013-07-09 VITALS — BP 140/69 | HR 118 | Temp 98.4°F | Resp 22 | Ht 64.0 in | Wt 86.2 lb

## 2013-07-09 DIAGNOSIS — R1011 Right upper quadrant pain: Secondary | ICD-10-CM

## 2013-07-09 DIAGNOSIS — K8020 Calculus of gallbladder without cholecystitis without obstruction: Secondary | ICD-10-CM

## 2013-07-09 MED ORDER — ONDANSETRON 4 MG PO TBDP: 4.0000 mg | ORAL_TABLET | Freq: Three times a day (TID) | ORAL | Status: DC | PRN

## 2013-07-09 NOTE — Progress Notes (Signed)
Patient ID: Tamara Adams, female   DOB: 1940-07-25, 72 y.o.   MRN: [id number]  Chief Complaint  Patient presents with  . Other    RUQ abdominal pain    HPI Tamara Adams is a 72 y.o. female.   HPI This is a very pleasant female referred by Dr. Eppie Gibson for evaluation of right upper quadrant abdominal pain. The patient has been having severe pain and right upper quadrant intermittent nausea and vomiting for almost a year. She had a CAT scan of her abdomen and pelvis 3 months ago which was unremarkable.  She actually had emergent surgery for a bowel obstruction and Faxon in June of this year.  This was unrelated to her right upper quadrant abdominal pain. She hurts with anything she eats. The pain as sharp and severe hammer first through to the back. Bowel movements are normal Past Medical History  Diagnosis Date  . COPD (chronic obstructive pulmonary disease)     FVC 72%, FEV1 29%, FEV1 ratio 32% (very severe (COPD)    Past Surgical History  Procedure Laterality Date  . Abdominal hysterectomy  1987    and one ovary  . Oophorectomy  1992  . Cataract extraction w/phaco  06/12/2012    Dr. Almon Register, left eye  . Cataract extraction  06/22/2012    Right eye, Dr. Zachary George.     Family History  Problem Relation Age of Onset  . Hypertension Mother   . Ovarian cancer Mother     Social History History  Substance Use Topics  . Smoking status: Current Every Day Smoker -- 0.25 packs/day    Types: Cigarettes  . Smokeless tobacco: Not on file     Comment: Using Electronic cigarettes  . Alcohol Use: No    Allergies  Allergen Reactions  . Maxidex [Dexamethasone] Other (See Comments)    dehydration  . Tylox [Oxycodone-Acetaminophen] Itching    Current Outpatient Prescriptions  Medication Sig Dispense Refill  . albuterol (PROVENTIL) (2.5 MG/3ML) 0.083% nebulizer solution USE 1 VIAL VIA NEBULIZER THREE TIMES A DAY  270 vial  0  . albuterol (VENTOLIN HFA) 108 (90 BASE) MCG/ACT  inhaler Inhale 2 puffs into the lungs every 6 (six) hours as needed. 2-4 puffs inhaled every 4-6 hours       . alendronate (FOSAMAX) 70 MG tablet Take 1 tablet (70 mg total) by mouth once a week. Take with a full glass of water on an empty stomach.  12 tablet  3  . amLODipine-benazepril (LOTREL) 5-40 MG per capsule Take 1 capsule by mouth daily.  90 capsule  1  . LORazepam (ATIVAN) 1 MG tablet Take 1 tablet (1 mg total) by mouth daily as needed for anxiety or sleep.  90 tablet  0  . omeprazole (PRILOSEC) 40 MG capsule Take 1 capsule (40 mg total) by mouth daily. For GI ulcer  90 capsule  1  . ondansetron (ZOFRAN-ODT) 4 MG disintegrating tablet Take 1 tablet (4 mg total) by mouth every 8 (eight) hours as needed for nausea.  20 tablet  0  . pravastatin (PRAVACHOL) 40 MG tablet Take 1 tablet (40 mg total) by mouth at bedtime.  90 tablet  0  . QVAR 80 MCG/ACT inhaler INHALE 2 PUFFS INTO THE LUNGS TWICE A DAY  3 g  0  . tiotropium (SPIRIVA HANDIHALER) 18 MCG inhalation capsule Use 2 inhalations from 1 capsule only once daily  90 capsule  3  . traMADol (ULTRAM) 50 MG tablet Take 1 tablet (50 mg  total) by mouth every 6 (six) hours as needed for pain.  30 tablet  0   No current facility-administered medications for this visit.    Review of Systems Review of Systems  Constitutional: Negative for fever, chills and unexpected weight change.  HENT: Negative for congestion, hearing loss, sore throat, trouble swallowing and voice change.   Eyes: Negative for visual disturbance.  Respiratory: Negative for cough and wheezing.   Cardiovascular: Negative for chest pain, palpitations and leg swelling.  Gastrointestinal: Positive for nausea, vomiting and abdominal pain. Negative for diarrhea, constipation, blood in stool, abdominal distention and anal bleeding.  Genitourinary: Negative for hematuria, vaginal bleeding and difficulty urinating.  Musculoskeletal: Positive for arthralgias and back pain.  Skin:  Negative for rash and wound.  Neurological: Negative for seizures, syncope and headaches.  Hematological: Negative for adenopathy. Does not bruise/bleed easily.  Psychiatric/Behavioral: Negative for confusion.    Blood pressure 140/69, pulse 118, temperature 98.4 F (36.9 C), temperature source Temporal, resp. rate 22, height 5\' 4"  (1.626 m), weight 86 lb 3.2 oz (39.1 kg).  Physical Exam Physical Exam  Constitutional: She is oriented to person, place, and time.  Cachectic-appearing female appearing uncomfortable  HENT:  Head: Normocephalic and atraumatic.  Right Ear: External ear normal.  Left Ear: External ear normal.  Nose: Nose normal.  Mouth/Throat: Oropharynx is clear and moist. No oropharyngeal exudate.  Eyes: Conjunctivae are normal. Pupils are equal, round, and reactive to light. Right eye exhibits no discharge. Left eye exhibits no discharge. No scleral icterus.  Neck: Normal range of motion. Neck supple. No tracheal deviation present.  Cardiovascular: Normal rate, regular rhythm, normal heart sounds and intact distal pulses.   No murmur heard. Pulmonary/Chest: Effort normal and breath sounds normal.  Abdominal: Soft. Bowel sounds are normal. There is tenderness. There is guarding.  There is tenderness with guarding in the right upper quadrant  Musculoskeletal: Normal range of motion. She exhibits no edema and no tenderness.  Lymphadenopathy:    She has no cervical adenopathy.  Neurological: She is alert and oriented to person, place, and time.  Skin: Skin is warm and dry. No rash noted. No erythema.  Psychiatric: Her behavior is normal. Judgment normal.    Data Reviewed   Assessment    Right upper quadrant abdominal pain of uncertain etiology     Plan    She certainly has the symptoms of biliary colic. Although her CAT scan did not show gallstones, a CAT scan can easily miss stones. I am going to order an ultrasound of her abdomen to evaluate the gallbladder. I  will call her with results and we will plan whether or not to proceed with cholecystectomy.        Semaj Coburn A 07/09/2013, 4:05 PM

## 2013-07-09 NOTE — Addendum Note (Signed)
Addended by: Abigail Miyamoto A on: 07/09/2013 04:12 PM   Modules accepted: Orders

## 2013-07-14 ENCOUNTER — Other Ambulatory Visit (INDEPENDENT_AMBULATORY_CARE_PROVIDER_SITE_OTHER): Payer: Self-pay | Admitting: Surgery

## 2013-07-14 ENCOUNTER — Telehealth (INDEPENDENT_AMBULATORY_CARE_PROVIDER_SITE_OTHER): Payer: Self-pay | Admitting: General Surgery

## 2013-07-14 ENCOUNTER — Ambulatory Visit
Admission: RE | Admit: 2013-07-14 | Discharge: 2013-07-14 | Disposition: A | Discharge status: Home / Self Care | Payer: Medicare Other | Source: Ambulatory Visit | Attending: Surgery | Admitting: Surgery

## 2013-07-14 DIAGNOSIS — R1011 Right upper quadrant pain: Secondary | ICD-10-CM

## 2013-07-14 DIAGNOSIS — K8020 Calculus of gallbladder without cholecystitis without obstruction: Secondary | ICD-10-CM

## 2013-07-14 DIAGNOSIS — I700 Atherosclerosis of aorta: Secondary | ICD-10-CM | POA: Diagnosis not present

## 2013-07-14 NOTE — Telephone Encounter (Signed)
Called patient to let her know that she will have a hida scan on 07-28-13 at [facility]. Patient is aware that the ultra sound was normal. And we will call her once we get the hida scan back

## 2013-07-21 ENCOUNTER — Other Ambulatory Visit: Payer: Self-pay | Admitting: *Deleted

## 2013-07-21 MED ORDER — LORAZEPAM 1 MG PO TABS: 1.0000 mg | ORAL_TABLET | Freq: Every day | ORAL | Status: DC | PRN

## 2013-07-27 ENCOUNTER — Ambulatory Visit (INDEPENDENT_AMBULATORY_CARE_PROVIDER_SITE_OTHER): Payer: Medicare Other

## 2013-07-27 ENCOUNTER — Encounter: Payer: Self-pay | Admitting: Sports Medicine

## 2013-07-27 ENCOUNTER — Ambulatory Visit (INDEPENDENT_AMBULATORY_CARE_PROVIDER_SITE_OTHER): Payer: Medicare Other | Admitting: Sports Medicine

## 2013-07-27 VITALS — BP 148/70 | HR 96 | Wt 88.0 lb

## 2013-07-27 DIAGNOSIS — M5137 Other intervertebral disc degeneration, lumbosacral region: Secondary | ICD-10-CM | POA: Diagnosis not present

## 2013-07-27 DIAGNOSIS — M51379 Other intervertebral disc degeneration, lumbosacral region without mention of lumbar back pain or lower extremity pain: Secondary | ICD-10-CM | POA: Diagnosis not present

## 2013-07-27 DIAGNOSIS — M51369 Other intervertebral disc degeneration, lumbar region without mention of lumbar back pain or lower extremity pain: Secondary | ICD-10-CM

## 2013-07-27 DIAGNOSIS — J438 Other emphysema: Secondary | ICD-10-CM

## 2013-07-27 DIAGNOSIS — J449 Chronic obstructive pulmonary disease, unspecified: Secondary | ICD-10-CM

## 2013-07-27 DIAGNOSIS — M5136 Other intervertebral disc degeneration, lumbar region: Secondary | ICD-10-CM

## 2013-07-27 DIAGNOSIS — J4489 Other specified chronic obstructive pulmonary disease: Secondary | ICD-10-CM

## 2013-07-27 MED ORDER — PREDNISONE 50 MG PO TABS: 50.0000 mg | ORAL_TABLET | Freq: Every day | ORAL | Status: DC

## 2013-07-27 MED ORDER — BENZONATATE 200 MG PO CAPS: 200.0000 mg | ORAL_CAPSULE | Freq: Three times a day (TID) | ORAL | Status: DC | PRN

## 2013-07-27 MED ORDER — DOXYCYCLINE HYCLATE 100 MG PO TABS: 100.0000 mg | ORAL_TABLET | Freq: Two times a day (BID) | ORAL | Status: DC

## 2013-07-27 NOTE — Assessment & Plan Note (Signed)
Continues to do very well, she responded extremely well to an upper lumbar epidural, her lower lumbar epidural was unfortunately only able to be done at the L5-S1 level, though her degenerative disease is worse at the L4-L5 level. She's not yet ready for repeat epidural however when we do proceed it will be an L2-L3 as well as L4-L5 likely transforaminal injection. She can call back as needed and I will order it.

## 2013-07-27 NOTE — Progress Notes (Signed)
  Subjective:    CC: Followup  HPI: Lumbar degenerative disc disease: Continue to do well after L2-L3 and L5-S1 epidurals, does not need any further interventional treatment right now.  Cough: History of COPD, still smokes about a pack a day, for the past several days she's had increasing cough, shortness of breath, wheeze, and sputum production. Moderate, persistent. No chest pain, lower extremity swelling.  Past medical history, Surgical history, Family history not pertinant except as noted below, Social history, Allergies, and medications have been entered into the medical record, reviewed, and no changes needed.   Review of Systems: No fevers, chills, night sweats, weight loss, chest pain, or shortness of breath.   Objective:    General: Well Developed, well nourished, and in no acute distress.  Neuro: Alert and oriented x3, extra-ocular muscles intact, sensation grossly intact.  HEENT: Normocephalic, atraumatic, pupils equal round reactive to light, neck supple, no masses, no lymphadenopathy, thyroid nonpalpable.  Skin: Warm and dry, no rashes. Cardiac: Regular rate and rhythm, no murmurs rubs or gallops, no lower extremity edema.  Respiratory: Coarse sounds bilaterally with expiratory wheezes. Not using accessory muscles, speaking in full sentences.  Impression and Recommendations:

## 2013-07-27 NOTE — Assessment & Plan Note (Signed)
Currently in acute exacerbation. Tessalon Perles. Prednisone, doxycycline, use bronchodilators as needed, x-ray. Return to see PCP if no better in a week.

## 2013-07-28 ENCOUNTER — Encounter (HOSPITAL_COMMUNITY)
Admission: RE | Admit: 2013-07-28 | Discharge: 2013-07-28 | Disposition: A | Discharge status: Home / Self Care | Payer: Medicare Other | Source: Ambulatory Visit | Attending: Surgery | Admitting: Surgery

## 2013-07-28 DIAGNOSIS — R948 Abnormal results of function studies of other organs and systems: Secondary | ICD-10-CM | POA: Diagnosis not present

## 2013-07-28 DIAGNOSIS — K8020 Calculus of gallbladder without cholecystitis without obstruction: Secondary | ICD-10-CM

## 2013-07-28 DIAGNOSIS — R1011 Right upper quadrant pain: Secondary | ICD-10-CM | POA: Diagnosis not present

## 2013-07-28 MED ORDER — TECHNETIUM TC 99M MEBROFENIN IV KIT
5.0000 | PACK | Freq: Once | INTRAVENOUS | Status: AC | PRN
  Administered 2013-07-28: 5 via INTRAVENOUS

## 2013-07-29 ENCOUNTER — Other Ambulatory Visit (INDEPENDENT_AMBULATORY_CARE_PROVIDER_SITE_OTHER): Payer: Self-pay | Admitting: Surgery

## 2013-08-04 ENCOUNTER — Encounter (HOSPITAL_COMMUNITY): Payer: Self-pay | Admitting: Respiratory Therapy

## 2013-08-12 ENCOUNTER — Encounter (HOSPITAL_COMMUNITY)
Admission: RE | Admit: 2013-08-12 | Discharge: 2013-08-12 | Disposition: A | Discharge status: Home / Self Care | Payer: Medicare Other | Source: Ambulatory Visit | Attending: Surgery | Admitting: Surgery

## 2013-08-12 ENCOUNTER — Other Ambulatory Visit (HOSPITAL_COMMUNITY): Payer: Self-pay | Admitting: *Deleted

## 2013-08-12 ENCOUNTER — Encounter (HOSPITAL_COMMUNITY): Payer: Self-pay

## 2013-08-12 DIAGNOSIS — Z01818 Encounter for other preprocedural examination: Secondary | ICD-10-CM | POA: Insufficient documentation

## 2013-08-12 DIAGNOSIS — J4489 Other specified chronic obstructive pulmonary disease: Secondary | ICD-10-CM | POA: Diagnosis not present

## 2013-08-12 DIAGNOSIS — Z01812 Encounter for preprocedural laboratory examination: Secondary | ICD-10-CM

## 2013-08-12 DIAGNOSIS — R109 Unspecified abdominal pain: Secondary | ICD-10-CM | POA: Diagnosis not present

## 2013-08-12 DIAGNOSIS — Z8041 Family history of malignant neoplasm of ovary: Secondary | ICD-10-CM | POA: Diagnosis not present

## 2013-08-12 DIAGNOSIS — F17200 Nicotine dependence, unspecified, uncomplicated: Secondary | ICD-10-CM | POA: Diagnosis present

## 2013-08-12 DIAGNOSIS — IMO0002 Reserved for concepts with insufficient information to code with codable children: Secondary | ICD-10-CM | POA: Diagnosis not present

## 2013-08-12 DIAGNOSIS — Z888 Allergy status to other drugs, medicaments and biological substances status: Secondary | ICD-10-CM | POA: Diagnosis not present

## 2013-08-12 DIAGNOSIS — Z8249 Family history of ischemic heart disease and other diseases of the circulatory system: Secondary | ICD-10-CM | POA: Diagnosis not present

## 2013-08-12 DIAGNOSIS — J449 Chronic obstructive pulmonary disease, unspecified: Secondary | ICD-10-CM | POA: Diagnosis not present

## 2013-08-12 DIAGNOSIS — K560 Paralytic ileus: Secondary | ICD-10-CM | POA: Diagnosis not present

## 2013-08-12 DIAGNOSIS — K828 Other specified diseases of gallbladder: Secondary | ICD-10-CM | POA: Diagnosis not present

## 2013-08-12 DIAGNOSIS — Z01810 Encounter for preprocedural cardiovascular examination: Secondary | ICD-10-CM | POA: Insufficient documentation

## 2013-08-12 DIAGNOSIS — I10 Essential (primary) hypertension: Secondary | ICD-10-CM | POA: Diagnosis not present

## 2013-08-12 DIAGNOSIS — Z86718 Personal history of other venous thrombosis and embolism: Secondary | ICD-10-CM | POA: Diagnosis not present

## 2013-08-12 DIAGNOSIS — K811 Chronic cholecystitis: Secondary | ICD-10-CM | POA: Diagnosis present

## 2013-08-12 DIAGNOSIS — K56609 Unspecified intestinal obstruction, unspecified as to partial versus complete obstruction: Secondary | ICD-10-CM | POA: Diagnosis not present

## 2013-08-12 HISTORY — DX: Pneumonia, unspecified organism: J18.9

## 2013-08-12 HISTORY — DX: Mononeuropathy, unspecified: G58.9

## 2013-08-12 HISTORY — DX: Calculus of kidney: N20.0

## 2013-08-12 HISTORY — DX: Essential (primary) hypertension: I10

## 2013-08-12 HISTORY — DX: Anemia, unspecified: D64.9

## 2013-08-12 HISTORY — DX: Reserved for concepts with insufficient information to code with codable children: IMO0002

## 2013-08-12 HISTORY — DX: Personal history of other diseases of the digestive system: Z87.19

## 2013-08-12 HISTORY — DX: Peptic ulcer, site unspecified, unspecified as acute or chronic, without hemorrhage or perforation: K27.9

## 2013-08-12 HISTORY — DX: Acute embolism and thrombosis of unspecified deep veins of unspecified lower extremity: I82.409

## 2013-08-12 LAB — BASIC METABOLIC PANEL
BUN: 13 mg/dL (ref 6–23)
CO2: 27 meq/L (ref 19–32)
CREATININE: 0.58 mg/dL (ref 0.50–1.10)
Calcium: 8.9 mg/dL (ref 8.4–10.5)
Chloride: 100 mEq/L (ref 96–112)
GFR calc Af Amer: >90 mL/min (ref >90)
GFR calc non Af Amer: 90 mL/min — ABNORMAL LOW (ref >90)
Glucose, Bld: 99 mg/dL (ref 70–99)
POTASSIUM: 4.8 meq/L (ref 3.7–5.3)
Sodium: 139 mEq/L (ref 137–147)

## 2013-08-12 LAB — CBC
HEMATOCRIT: 40.5 % (ref 36.0–46.0)
Hemoglobin: 13.8 g/dL (ref 12.0–15.0)
MCH: 32.3 pg (ref 26.0–34.0)
MCHC: 34.1 g/dL (ref 30.0–36.0)
MCV: 94.8 fL (ref 78.0–100.0)
Platelets: 288 10*3/uL (ref 150–400)
RBC: 4.27 MIL/uL (ref 3.87–5.11)
RDW: 13.2 % (ref 11.5–15.5)
WBC: 6.9 10*3/uL (ref 4.0–10.5)

## 2013-08-12 NOTE — Pre-Procedure Instructions (Deleted)
Tamara Adams  08/12/2013   Your procedure is scheduled on:  Tuesday, August 18, 2013 at 9:50 AM.   Report to [facility] Entrance "A" Admitting Office at 6:50 AM.   Call this number if you have problems the morning of surgery: [phone]   Remember:   Do not eat food or drink liquids after midnight Monday, 08/17/13.   Take these medicines the morning of surgery with A SIP OF WATER: beclomethasone (QVAR), omeprazole (PRILOSEC), predniSONE (DELTASONE), tiotropium (SPIRIVA HANDIHALER), ondansetron (ZOFRAN-ODT) - if needed, LORazepam (ATIVAN) - if needed,  albuterol (VENTOLIN HFA) - if needed (Please bring inhaler with you day of surgery).    Do not wear jewelry, make-up or nail polish.  Do not wear lotions, powders, or perfumes. You may wear deodorant.  Do not shave 48 hours prior to surgery. .  Do not bring valuables to the hospital.  [facility] is not responsible                  for any belongings or valuables.               Contacts, dentures or bridgework may not be worn into surgery.  Leave suitcase in the car. After surgery it may be brought to your room.  For patients admitted to the hospital, discharge time is determined by your                treatment team.               Patients discharged the day of surgery will not be allowed to drive  home.  Name and phone number of your driver: Family/friend   Special Instructions: Shower using CHG 2 nights before surgery and the night before surgery.  If you shower the day of surgery use CHG.  Use special wash - you have one bottle of CHG for all showers.  You should use approximately 1/3 of the bottle for each shower.   Please read over the following fact sheets that you were given: Pain Booklet, Coughing and Deep Breathing and Surgical Site Infection Prevention

## 2013-08-12 NOTE — Pre-Procedure Instructions (Signed)
Tamara Adams  08/12/2013   Your procedure is scheduled on:  Tuesday, August 18, 2013 at 9:50 AM.   Report to [facility] Entrance "A" Admitting Office at 5:30 AM.   Call this number if you have problems the morning of surgery: [phone]   Remember:   Do not eat food or drink liquids after midnight Monday, 08/17/13.   Take these medicines the morning of surgery with A SIP OF WATER: beclomethasone (QVAR), omeprazole (PRILOSEC), predniSONE (DELTASONE), tiotropium (SPIRIVA HANDIHALER), ondansetron (ZOFRAN-ODT) - if needed, LORazepam (ATIVAN) - if needed,  albuterol (VENTOLIN HFA) - if needed (Please bring inhaler with you day of surgery).    Do not wear jewelry, make-up or nail polish.  Do not wear lotions, powders, or perfumes. You may wear deodorant.  Do not shave 48 hours prior to surgery. .  Do not bring valuables to the hospital.  [facility] is not responsible                  for any belongings or valuables.               Contacts, dentures or bridgework may not be worn into surgery.  Leave suitcase in the car. After surgery it may be brought to your room.  For patients admitted to the hospital, discharge time is determined by your                treatment team.               Patients discharged the day of surgery will not be allowed to drive  home.  Name and phone number of your driver: Family/friend   Special Instructions: Shower using CHG 2 nights before surgery and the night before surgery.  If you shower the day of surgery use CHG.  Use special wash - you have one bottle of CHG for all showers.  You should use approximately 1/3 of the bottle for each shower.   Please read over the following fact sheets that you were given: Pain Booklet, Coughing and Deep Breathing and Surgical Site Infection Prevention

## 2013-08-13 NOTE — Progress Notes (Signed)
Anesthesia Chart Review:  Patient is a 73 year old female scheduled for laparoscopic cholecystectomy on 08/18/13 by Dr. Coralie Keens.  History includes smoking, COPD, HTN, remote history of post-operative DVT, anemia, hiatal hernia, small bowel obstruction requiring surgery in [facility] 12/2012.  PCP is Dr. Beatrice Lecher. She was seen by cardiologist Dr. Stanford Breed in 09/2010 for palpitations. She had a negative CardioNet monitor with non-ischemic stress test..  EKG on 08/12/13 showed NSR, biatrial enlargement, septal infarct (age undetermined). R wave is lower in V3, otherwise not significantly changed since 09/28/10. No chest pain symptoms documented from her PAT visit.  She had a normal nuclear stress test, EF 84% on 10/18/10.  Carotid duplex 09/25/10: < 50 % bilateral ICA stenosis.  CXR on 07/27/13 showed: Emphysema without acute cardiopulmonary findings.  PFTs 09/28/10: FVC 72%, FEV1 29%, FEV1 ratio 32% (very severe (COPD).  She is on QVAR, Spiriva, albuterol.  Preoperative labs noted.  She tolerated surgery for SBO in 12/2012.  If no acute changes or new cardiopulmonary symptoms then I would anticipate that she could proceed as planned.  [facility] Short Stay Center/Anesthesiology Phone [phone] 08/14/2013 9:22 AM

## 2013-08-13 NOTE — Progress Notes (Signed)
When pt was here yesterday for her PAT appt, she stated that Dr. Trevor Mace office had told her that she needed to be here the day of surgery at 7:30 AM. I had told her 5:30 AM according to instructions that we have that Dr. Ninfa Linden likes his first 2 cases to be here at 5:30. Pt's surgery does not start until 9:50 and she states she cannot get here at 5:30 AM. I called and spoke with Joellen Jersey, scheduler for Dr. Ninfa Linden and she stated that she tells all his pt's 2 hours prior to surgery, that she doesn't know anything about "special times". I asked Sharyn Lull, flow coordinator for Short Stay if it was ok for pt to come in at 7:30 since she had already made transportation arrangements for that time and could not come in at 5:30 AM. She stated yes. I called pt and told her to be here at 7:30 day of surgery. Pt was very grateful and happy.

## 2013-08-14 ENCOUNTER — Encounter (HOSPITAL_BASED_OUTPATIENT_CLINIC_OR_DEPARTMENT_OTHER): Payer: Self-pay | Admitting: Emergency Medicine

## 2013-08-14 ENCOUNTER — Inpatient Hospital Stay (HOSPITAL_BASED_OUTPATIENT_CLINIC_OR_DEPARTMENT_OTHER)
Admission: EM | Admit: 2013-08-14 | Discharge: 2013-08-19 | DRG: 358 | Disposition: A | Discharge status: Home / Self Care | Payer: Medicare Other | Attending: Surgery | Admitting: Surgery

## 2013-08-14 ENCOUNTER — Other Ambulatory Visit: Payer: Self-pay

## 2013-08-14 ENCOUNTER — Emergency Department (HOSPITAL_BASED_OUTPATIENT_CLINIC_OR_DEPARTMENT_OTHER): Payer: Medicare Other

## 2013-08-14 ENCOUNTER — Emergency Department (HOSPITAL_COMMUNITY): Payer: Medicare Other

## 2013-08-14 DIAGNOSIS — IMO0002 Reserved for concepts with insufficient information to code with codable children: Secondary | ICD-10-CM

## 2013-08-14 DIAGNOSIS — Z86718 Personal history of other venous thrombosis and embolism: Secondary | ICD-10-CM

## 2013-08-14 DIAGNOSIS — K56609 Unspecified intestinal obstruction, unspecified as to partial versus complete obstruction: Secondary | ICD-10-CM | POA: Diagnosis not present

## 2013-08-14 DIAGNOSIS — Z8041 Family history of malignant neoplasm of ovary: Secondary | ICD-10-CM

## 2013-08-14 DIAGNOSIS — K811 Chronic cholecystitis: Secondary | ICD-10-CM | POA: Diagnosis present

## 2013-08-14 DIAGNOSIS — J449 Chronic obstructive pulmonary disease, unspecified: Secondary | ICD-10-CM | POA: Diagnosis present

## 2013-08-14 DIAGNOSIS — Z8249 Family history of ischemic heart disease and other diseases of the circulatory system: Secondary | ICD-10-CM

## 2013-08-14 DIAGNOSIS — F17200 Nicotine dependence, unspecified, uncomplicated: Secondary | ICD-10-CM | POA: Diagnosis present

## 2013-08-14 DIAGNOSIS — J4489 Other specified chronic obstructive pulmonary disease: Secondary | ICD-10-CM | POA: Diagnosis present

## 2013-08-14 DIAGNOSIS — K8020 Calculus of gallbladder without cholecystitis without obstruction: Secondary | ICD-10-CM

## 2013-08-14 DIAGNOSIS — I10 Essential (primary) hypertension: Secondary | ICD-10-CM

## 2013-08-14 DIAGNOSIS — K56600 Partial intestinal obstruction, unspecified as to cause: Secondary | ICD-10-CM

## 2013-08-14 DIAGNOSIS — K560 Paralytic ileus: Secondary | ICD-10-CM

## 2013-08-14 DIAGNOSIS — Z888 Allergy status to other drugs, medicaments and biological substances status: Secondary | ICD-10-CM

## 2013-08-14 LAB — COMPREHENSIVE METABOLIC PANEL
ALK PHOS: 73 U/L (ref 39–117)
ALT: 22 U/L (ref 0–35)
AST: 28 U/L (ref 0–37)
Albumin: 4.2 g/dL (ref 3.5–5.2)
BUN: 10 mg/dL (ref 6–23)
CO2: 29 mEq/L (ref 19–32)
CREATININE: 0.7 mg/dL (ref 0.50–1.10)
Calcium: 9.8 mg/dL (ref 8.4–10.5)
Chloride: 93 mEq/L — ABNORMAL LOW (ref 96–112)
GFR calc non Af Amer: 85 mL/min — ABNORMAL LOW (ref >90)
GLUCOSE: 106 mg/dL — AB (ref 70–99)
POTASSIUM: 4.2 meq/L (ref 3.7–5.3)
Sodium: 138 mEq/L (ref 137–147)
Total Bilirubin: 0.4 mg/dL (ref 0.3–1.2)
Total Protein: 7.1 g/dL (ref 6.0–8.3)

## 2013-08-14 LAB — URINE MICROSCOPIC-ADD ON

## 2013-08-14 LAB — URINALYSIS, ROUTINE W REFLEX MICROSCOPIC
BILIRUBIN URINE: NEGATIVE
Glucose, UA: NEGATIVE mg/dL
KETONES UR: 40 mg/dL — AB
LEUKOCYTES UA: NEGATIVE
Nitrite: NEGATIVE
Protein, ur: 100 mg/dL — AB
Specific Gravity, Urine: 1.021 (ref 1.005–1.030)
UROBILINOGEN UA: 1 mg/dL (ref 0.0–1.0)
pH: 8 (ref 5.0–8.0)

## 2013-08-14 LAB — CBC WITH DIFFERENTIAL/PLATELET
BASOS PCT: 0 % (ref 0–1)
Basophils Absolute: 0 10*3/uL (ref 0.0–0.1)
EOS PCT: 0 % (ref 0–5)
Eosinophils Absolute: 0.1 10*3/uL (ref 0.0–0.7)
HEMATOCRIT: 43.9 % (ref 36.0–46.0)
Hemoglobin: 15.1 g/dL — ABNORMAL HIGH (ref 12.0–15.0)
LYMPHS ABS: 1.7 10*3/uL (ref 0.7–4.0)
Lymphocytes Relative: 15 % (ref 12–46)
MCH: 32.4 pg (ref 26.0–34.0)
MCHC: 34.4 g/dL (ref 30.0–36.0)
MCV: 94.2 fL (ref 78.0–100.0)
MONO ABS: 0.9 10*3/uL (ref 0.1–1.0)
MONOS PCT: 8 % (ref 3–12)
NEUTROS PCT: 77 % (ref 43–77)
Neutro Abs: 9 10*3/uL — ABNORMAL HIGH (ref 1.7–7.7)
Platelets: 340 10*3/uL (ref 150–400)
RBC: 4.66 MIL/uL (ref 3.87–5.11)
RDW: 12.6 % (ref 11.5–15.5)
WBC: 11.7 10*3/uL — ABNORMAL HIGH (ref 4.0–10.5)

## 2013-08-14 LAB — LIPASE, BLOOD: Lipase: 44 U/L (ref 11–59)

## 2013-08-14 LAB — TROPONIN I: Troponin I: <0.3 ng/mL (ref <0.30)

## 2013-08-14 MED ORDER — TIOTROPIUM BROMIDE MONOHYDRATE 18 MCG IN CAPS
18.0000 ug | ORAL_CAPSULE | Freq: Every day | RESPIRATORY_TRACT | Status: DC
  Administered 2013-08-15 – 2013-08-19 (×4): 18 ug via RESPIRATORY_TRACT
  Filled 2013-08-14: qty 5

## 2013-08-14 MED ORDER — AMLODIPINE BESY-BENAZEPRIL HCL 5-40 MG PO CAPS: 1.0000 | ORAL_CAPSULE | Freq: Every day | ORAL | Status: DC

## 2013-08-14 MED ORDER — DEXTROSE-NACL 5-0.9 % IV SOLN
INTRAVENOUS | Status: DC
  Administered 2013-08-15 – 2013-08-19 (×8): via INTRAVENOUS

## 2013-08-14 MED ORDER — ALBUTEROL SULFATE (2.5 MG/3ML) 0.083% IN NEBU
2.5000 mg | INHALATION_SOLUTION | Freq: Four times a day (QID) | RESPIRATORY_TRACT | Status: DC | PRN
  Administered 2013-08-15 (×2): 2.5 mg via RESPIRATORY_TRACT
  Filled 2013-08-14 (×2): qty 3

## 2013-08-14 MED ORDER — ONDANSETRON HCL 4 MG/2ML IJ SOLN
4.0000 mg | Freq: Once | INTRAMUSCULAR | Status: AC
  Administered 2013-08-14: 4 mg via INTRAVENOUS
  Filled 2013-08-14: qty 2

## 2013-08-14 MED ORDER — BECLOMETHASONE DIPROPIONATE 80 MCG/ACT IN AERS
2.0000 | INHALATION_SPRAY | Freq: Two times a day (BID) | RESPIRATORY_TRACT | Status: DC
  Administered 2013-08-15 – 2013-08-19 (×8): 2 via RESPIRATORY_TRACT
  Filled 2013-08-14: qty 8.7

## 2013-08-14 MED ORDER — ALBUTEROL SULFATE HFA 108 (90 BASE) MCG/ACT IN AERS: 2.0000 | INHALATION_SPRAY | Freq: Four times a day (QID) | RESPIRATORY_TRACT | Status: DC | PRN

## 2013-08-14 MED ORDER — MORPHINE SULFATE 4 MG/ML IJ SOLN
4.0000 mg | Freq: Once | INTRAMUSCULAR | Status: AC
  Administered 2013-08-14: 4 mg via INTRAVENOUS
  Filled 2013-08-14: qty 1

## 2013-08-14 NOTE — ED Provider Notes (Addendum)
CSN: [id number]     Arrival date & time 08/14/13  1720 History   First MD Initiated Contact with Patient 08/14/13 1730     Chief Complaint  Patient presents with  . Emesis   (Consider location/radiation/quality/duration/timing/severity/associated sxs/prior Treatment) HPI Comments: Patient presents to the ER for evaluation of abdominal pain. Patient reports that she had pain in her abdomen when she woke up this morning. Over the course of the day it has worsened, and in the last hour it has become severe. Patient reports that the pain is "all over". She reports that she has gallbladder disease in his having her gallbladder removed next week. She says that this pain, however, is not like what she does have gallbladder. She has had a history of a bowel obstruction and this feels more similar.  She has felt constipated over the last 4 or 5 days. She has been taking a laxative and has had bowel movements. Her last normal bowel movement was yesterday. She has not had any nausea or vomiting.  Patient is a 73 y.o. female presenting with vomiting.  Emesis Associated symptoms: abdominal pain     Past Medical History  Diagnosis Date  . COPD (chronic obstructive pulmonary disease)     FVC 72%, FEV1 29%, FEV1 ratio 32% (very severe (COPD)  . Hypertension   . Pneumonia   . DVT (deep venous thrombosis)     "years ago after a surgery"  . Kidney stone   . Pinched nerve     in back  . Peptic ulcer   . Colon polyp     removed, benign  . H/O hiatal hernia   . Degenerative disc disease   . Anemia     as a child   Past Surgical History  Procedure Laterality Date  . Abdominal hysterectomy  1987    and one ovary  . Oophorectomy  1992  . Cataract extraction w/phaco  06/12/2012    Dr. Boykin Reaper, left eye  . Cataract extraction  06/22/2012    Right eye, Dr. Lester Kinsman.   Marland Kitchen Urethra surgery    . Small intestine surgery      for a blockage, no bowel was removed, just kinked from scar tissue    Family History  Problem Relation Age of Onset  . Hypertension Mother   . Ovarian cancer Mother    History  Substance Use Topics  . Smoking status: Current Every Day Smoker -- 0.25 packs/day    Types: Cigarettes  . Smokeless tobacco: Never Used     Comment: Using Electronic cigarettes  . Alcohol Use: No   OB History   Grav Para Term Preterm Abortions TAB SAB Ect Mult Living                 Review of Systems  Gastrointestinal: Positive for abdominal pain.  All other systems reviewed and are negative.    Allergies  Maxidex and Tylox  Home Medications   Current Outpatient Rx  Name  Route  Sig  Dispense  Refill  . albuterol (PROVENTIL) (2.5 MG/3ML) 0.083% nebulizer solution   Nebulization   Take 2.5 mg by nebulization every 6 (six) hours as needed for wheezing or shortness of breath.         Marland Kitchen albuterol (VENTOLIN HFA) 108 (90 BASE) MCG/ACT inhaler   Inhalation   Inhale 2 puffs into the lungs every 6 (six) hours as needed. 2-4 puffs inhaled every 4-6 hours          .  alendronate (FOSAMAX) 70 MG tablet   Oral   Take 1 tablet (70 mg total) by mouth once a week. Take with a full glass of water on an empty stomach.   12 tablet   3   . amLODipine-benazepril (LOTREL) 5-40 MG per capsule   Oral   Take 1 capsule by mouth daily.   90 capsule   1   . beclomethasone (QVAR) 80 MCG/ACT inhaler   Inhalation   Inhale 2 puffs into the lungs 2 (two) times daily.         . benzonatate (TESSALON) 200 MG capsule   Oral   Take 1 capsule (200 mg total) by mouth 3 (three) times daily as needed for cough.   45 capsule   0   . LORazepam (ATIVAN) 1 MG tablet   Oral   Take 1 tablet (1 mg total) by mouth daily as needed for anxiety or sleep.   30 tablet   0   . omeprazole (PRILOSEC) 40 MG capsule   Oral   Take 1 capsule (40 mg total) by mouth daily. For GI ulcer   90 capsule   1   . ondansetron (ZOFRAN-ODT) 4 MG disintegrating tablet   Oral   Take 1 tablet (4 mg  total) by mouth every 8 (eight) hours as needed for nausea.   20 tablet   0   . pravastatin (PRAVACHOL) 40 MG tablet   Oral   Take 1 tablet (40 mg total) by mouth at bedtime.   90 tablet   0   . predniSONE (DELTASONE) 50 MG tablet   Oral   Take 1 tablet (50 mg total) by mouth daily.   5 tablet   0   . tiotropium (SPIRIVA HANDIHALER) 18 MCG inhalation capsule      Use 2 inhalations from 1 capsule only once daily   90 capsule   3    BP 165/67  Pulse 96  Temp(Src) 98.2 F (36.8 C) (Oral)  Resp 24  Ht 5\' 4"  (1.626 m)  Wt 87 lb (39.463 kg)  BMI 14.93 kg/m2  SpO2 96% Physical Exam  Constitutional: She is oriented to person, place, and time. She appears well-developed and well-nourished. No distress.  HENT:  Head: Normocephalic and atraumatic.  Right Ear: Hearing normal.  Left Ear: Hearing normal.  Nose: Nose normal.  Mouth/Throat: Oropharynx is clear and moist and mucous membranes are normal.  Eyes: Conjunctivae and EOM are normal. Pupils are equal, round, and reactive to light.  Neck: Normal range of motion. Neck supple.  Cardiovascular: Regular rhythm, S1 normal and S2 normal.  Exam reveals no gallop and no friction rub.   No murmur heard. Pulmonary/Chest: Effort normal and breath sounds normal. No respiratory distress. She exhibits no tenderness.  Abdominal: Soft. Normal appearance. Bowel sounds are decreased. There is no hepatosplenomegaly. There is generalized tenderness. There is no rebound, no guarding, no tenderness at McBurney's point and negative Murphy's sign. No hernia.  Musculoskeletal: Normal range of motion.  Neurological: She is alert and oriented to person, place, and time. She has normal strength. No cranial nerve deficit or sensory deficit. Coordination normal. GCS eye subscore is 4. GCS verbal subscore is 5. GCS motor subscore is 6.  Skin: Skin is warm, dry and intact. No rash noted. No cyanosis.  Psychiatric: She has a normal mood and affect. Her speech  is normal and behavior is normal. Thought content normal.    ED Course  Procedures (including critical care time)  Labs Review Labs Reviewed  CBC WITH DIFFERENTIAL - Abnormal; Notable for the following:    WBC 11.7 (*)    Hemoglobin 15.1 (*)    Neutro Abs 9.0 (*)    All other components within normal limits  COMPREHENSIVE METABOLIC PANEL - Abnormal; Notable for the following:    Chloride 93 (*)    Glucose, Bld 106 (*)    GFR calc non Af Amer 85 (*)    All other components within normal limits  URINALYSIS, ROUTINE W REFLEX MICROSCOPIC - Abnormal; Notable for the following:    Color, Urine AMBER (*)    APPearance CLOUDY (*)    Hgb urine dipstick LARGE (*)    Ketones, ur 40 (*)    Protein, ur 100 (*)    All other components within normal limits  URINE MICROSCOPIC-ADD ON - Abnormal; Notable for the following:    Squamous Epithelial / LPF FEW (*)    All other components within normal limits  LIPASE, BLOOD  TROPONIN I   Imaging Review Dg Abd Acute W/chest  08/14/2013   CLINICAL DATA:  Abdominal pain  EXAM: ACUTE ABDOMEN SERIES (ABDOMEN 2 VIEW & CHEST 1 VIEW)  COMPARISON:  DG CHEST 2 VIEW dated 07/27/2013; DG ABDOMEN 1V dated 11/21/2012  FINDINGS: Lungs remain hyperaerated. Chronic interstitial and bronchitic changes persist.  There is no free intraperitoneal gas. Nonspecific air-fluid levels are noted. Borderline distended small bowel loops project over the lower abdomen scoliosis and degenerative changes in the lumbar spine. Moderate stool in the distal colon. Phleboliths project over the pelvis.  IMPRESSION: Early partial small bowel obstruction pattern.  Chronic pulmonary parenchymal disease.   Electronically Signed   By: Maryclare Bean M.D.   On: 08/14/2013 18:13    EKG Interpretation   None       Date: 08/14/2013  Rate: 85  Rhythm: normal sinus rhythm  QRS Axis: normal  Intervals: PR shortened  ST/T Wave abnormalities: normal  Conduction Disutrbances:none  Narrative  Interpretation:   Old EKG Reviewed: unchanged    MDM  Diagnosis: PSBO  Patient presents to the ER for evaluation of abdominal pain. She has been having pain since yesterday, but pain has progressively worsened. Patient has also felt constipated, has been using laxatives for the last several days. Patient reports that she has gallbladder disease and is scheduled for cholecystectomy next week. She also reports a history of a small bowel obstruction. Patient reports that the pain she is experiencing today he feels identical to her SBO, not her gallbladder pain.  Patient reveals very slight leukocytosis. She has normal LFTs. There is no focal right upper quadrant tenderness, negative Murphy's. X-ray does show evidence of PSBO.as the patient has had small bowel obstruction in the past, did not feel that CT imaging was necessary.  Discussed with Doctor Marcello Moores, on call for general surgery. She has requested the patient be transferred to the ER at [facility] for further evaluation. Patient is stable for transfer at this time.  Addendum: Patient had good pain control with the first dose of morphine. She is now starting to have increased pain. Will repeat morphine, aattempt NG tube for symptomatic relief.    Orpah Greek, MD 08/14/13 2002  Orpah Greek, MD 08/14/13 2007

## 2013-08-14 NOTE — ED Notes (Signed)
Pt rec'd from [facility] from [facility] to see Dr Odetta Pink re: SBO.  Pt reports she is pain free at this time.  NG right nare now connected to low intermittent suction.  Pt alert, sister Leda Gauze at bedside, chatting with sister.  Dr R notified by UC of pt arrival.

## 2013-08-14 NOTE — H&P (Signed)
Tamara Adams is an 73 y.o. female.   Chief Complaint: abdominal pain, small bowel obstruction HPI: the patient is a 74 year old female who is scheduled for a laparoscopic cholecystectomy per Dr. Ninfa Linden. The patient presents today with a 12-18 hour history of abdominal pain with nausea. The patient states that it feels similar to her previous episode of the small bowel function. The patient states that her last bowel movement was the day provider admission is unsure of her last flatus.  Patient was seen at an outside hospital With a KUB which showed dilated loops of bowel likely consistent with a partial small bowel obstruction.    Past Medical History  Diagnosis Date  . COPD (chronic obstructive pulmonary disease)     FVC 72%, FEV1 29%, FEV1 ratio 32% (very severe (COPD)  . Hypertension   . Pneumonia   . DVT (deep venous thrombosis)     "years ago after a surgery"  . Kidney stone   . Pinched nerve     in back  . Peptic ulcer   . Colon polyp     removed, benign  . H/O hiatal hernia   . Degenerative disc disease   . Anemia     as a child    Past Surgical History  Procedure Laterality Date  . Abdominal hysterectomy  1987    and one ovary  . Oophorectomy  1992  . Cataract extraction w/phaco  06/12/2012    Dr. Boykin Reaper, left eye  . Cataract extraction  06/22/2012    Right eye, Dr. Lester Kinsman.   Marland Kitchen Urethra surgery    . Small intestine surgery      for a blockage, no bowel was removed, just kinked from scar tissue    Family History  Problem Relation Age of Onset  . Hypertension Mother   . Ovarian cancer Mother    Social History:  reports that she has been smoking Cigarettes.  She has been smoking about 0.25 packs per day. She has never used smokeless tobacco. She reports that she does not drink alcohol or use illicit drugs.  Allergies:  Allergies  Allergen Reactions  . Maxidex [Dexamethasone] Other (See Comments)    dehydration  . Tylox [Oxycodone-Acetaminophen]  Itching     (Not in a hospital admission)  Results for orders placed during the hospital encounter of 08/14/13 (from the past 48 hour(s))  CBC WITH DIFFERENTIAL     Status: Abnormal   Collection Time    08/14/13  5:39 PM      Result Value Range   WBC 11.7 (*) 4.0 - 10.5 K/uL   RBC 4.66  3.87 - 5.11 MIL/uL   Hemoglobin 15.1 (*) 12.0 - 15.0 g/dL   HCT 43.9  36.0 - 46.0 %   MCV 94.2  78.0 - 100.0 fL   MCH 32.4  26.0 - 34.0 pg   MCHC 34.4  30.0 - 36.0 g/dL   RDW 12.6  11.5 - 15.5 %   Platelets 340  150 - 400 K/uL   Neutrophils Relative % 77  43 - 77 %   Neutro Abs 9.0 (*) 1.7 - 7.7 K/uL   Lymphocytes Relative 15  12 - 46 %   Lymphs Abs 1.7  0.7 - 4.0 K/uL   Monocytes Relative 8  3 - 12 %   Monocytes Absolute 0.9  0.1 - 1.0 K/uL   Eosinophils Relative 0  0 - 5 %   Eosinophils Absolute 0.1  0.0 - 0.7 K/uL  Basophils Relative 0  0 - 1 %   Basophils Absolute 0.0  0.0 - 0.1 K/uL  COMPREHENSIVE METABOLIC PANEL     Status: Abnormal   Collection Time    08/14/13  5:39 PM      Result Value Range   Sodium 138  137 - 147 mEq/L   Potassium 4.2  3.7 - 5.3 mEq/L   Chloride 93 (*) 96 - 112 mEq/L   CO2 29  19 - 32 mEq/L   Glucose, Bld 106 (*) 70 - 99 mg/dL   BUN 10  6 - 23 mg/dL   Creatinine, Ser 0.70  0.50 - 1.10 mg/dL   Calcium 9.8  8.4 - 10.5 mg/dL   Total Protein 7.1  6.0 - 8.3 g/dL   Albumin 4.2  3.5 - 5.2 g/dL   AST 28  0 - 37 U/L   ALT 22  0 - 35 U/L   Alkaline Phosphatase 73  39 - 117 U/L   Total Bilirubin 0.4  0.3 - 1.2 mg/dL   GFR calc non Af Amer 85 (*) >90 mL/min   GFR calc Af Amer >90  >90 mL/min   Comment: (NOTE)     The eGFR has been calculated using the CKD EPI equation.     This calculation has not been validated in all clinical situations.     eGFR's persistently <90 mL/min signify possible Chronic Kidney     Disease.  LIPASE, BLOOD     Status: None   Collection Time    08/14/13  5:39 PM      Result Value Range   Lipase 44  11 - 59 U/L  TROPONIN I      Status: None   Collection Time    08/14/13  5:39 PM      Result Value Range   Troponin I <0.30  <0.30 ng/mL   Comment:            Due to the release kinetics of cTnI,     a negative result within the first hours     of the onset of symptoms does not rule out     myocardial infarction with certainty.     If myocardial infarction is still suspected,     repeat the test at appropriate intervals.  URINALYSIS, ROUTINE W REFLEX MICROSCOPIC     Status: Abnormal   Collection Time    08/14/13  5:51 PM      Result Value Range   Color, Urine AMBER (*) YELLOW   Comment: BIOCHEMICALS MAY BE AFFECTED BY COLOR   APPearance CLOUDY (*) CLEAR   Specific Gravity, Urine 1.021  1.005 - 1.030   pH 8.0  5.0 - 8.0   Glucose, UA NEGATIVE  NEGATIVE mg/dL   Hgb urine dipstick LARGE (*) NEGATIVE   Bilirubin Urine NEGATIVE  NEGATIVE   Ketones, ur 40 (*) NEGATIVE mg/dL   Protein, ur 100 (*) NEGATIVE mg/dL   Urobilinogen, UA 1.0  0.0 - 1.0 mg/dL   Nitrite NEGATIVE  NEGATIVE   Leukocytes, UA NEGATIVE  NEGATIVE  URINE MICROSCOPIC-ADD ON     Status: Abnormal   Collection Time    08/14/13  5:51 PM      Result Value Range   Squamous Epithelial / LPF FEW (*) RARE   RBC / HPF 11-20  <3 RBC/hpf   Bacteria, UA RARE  RARE   Dg Abd Acute W/chest  08/14/2013   CLINICAL DATA:  Abdominal pain  EXAM: ACUTE  ABDOMEN SERIES (ABDOMEN 2 VIEW & CHEST 1 VIEW)  COMPARISON:  DG CHEST 2 VIEW dated 07/27/2013; DG ABDOMEN 1V dated 11/21/2012  FINDINGS: Lungs remain hyperaerated. Chronic interstitial and bronchitic changes persist.  There is no free intraperitoneal gas. Nonspecific air-fluid levels are noted. Borderline distended small bowel loops project over the lower abdomen scoliosis and degenerative changes in the lumbar spine. Moderate stool in the distal colon. Phleboliths project over the pelvis.  IMPRESSION: Early partial small bowel obstruction pattern.  Chronic pulmonary parenchymal disease.   Electronically Signed   By: Maryclare Bean M.D.   On: 08/14/2013 18:13    Review of Systems  Constitutional: Negative for weight loss.  HENT: Negative for ear discharge, ear pain, hearing loss and tinnitus.   Eyes: Negative for blurred vision, double vision, photophobia and pain.  Respiratory: Negative for cough, sputum production and shortness of breath.   Cardiovascular: Negative for chest pain.  Gastrointestinal: Positive for nausea and abdominal pain. Negative for vomiting.  Genitourinary: Negative for dysuria, urgency, frequency and flank pain.  Musculoskeletal: Negative for back pain, falls, joint pain, myalgias and neck pain.  Neurological: Negative for dizziness, tingling, sensory change, focal weakness, loss of consciousness and headaches.  Endo/Heme/Allergies: Does not bruise/bleed easily.  Psychiatric/Behavioral: Negative for depression, memory loss and substance abuse. The patient is not nervous/anxious.     Blood pressure 135/63, pulse 96, temperature 99 F (37.2 C), temperature source Oral, resp. rate 20, height _0  (1.626 m), weight 87 lb (39.463 kg), SpO2 92.00%. Physical Exam  Vitals reviewed. Constitutional: She is oriented to person, place, and time. She appears well-developed and well-nourished. She is cooperative. No distress. Cervical collar and nasal cannula in place.  HENT:  Head: Normocephalic and atraumatic. Head is without raccoon's eyes, without Battle's sign, without abrasion, without contusion and without laceration.  Right Ear: Hearing, tympanic membrane, external ear and ear canal normal. No lacerations. No drainage or tenderness. No foreign bodies. Tympanic membrane is not perforated. No hemotympanum.  Left Ear: Hearing, tympanic membrane, external ear and ear canal normal. No lacerations. No drainage or tenderness. No foreign bodies. Tympanic membrane is not perforated. No hemotympanum.  Nose: Nose normal. No nose lacerations, sinus tenderness, nasal deformity or nasal septal hematoma. No  epistaxis.  Mouth/Throat: Uvula is midline, oropharynx is clear and moist and mucous membranes are normal. No lacerations.  Eyes: Conjunctivae, EOM and lids are normal. Pupils are equal, round, and reactive to light. No scleral icterus.  Neck: Trachea normal. No JVD present. No spinous process tenderness and no muscular tenderness present. Carotid bruit is not present. No thyromegaly present.  Cardiovascular: Normal rate, regular rhythm, normal heart sounds, intact distal pulses and normal pulses.   Respiratory: Effort normal and breath sounds normal. No respiratory distress. She exhibits no tenderness, no bony tenderness, no laceration and no crepitus.  GI: Soft. Normal appearance and bowel sounds are normal. She exhibits no distension and no mass. There is no tenderness. There is no rigidity, no rebound, no guarding and no CVA tenderness.  Musculoskeletal: Normal range of motion. She exhibits no edema and no tenderness.  Lymphadenopathy:    She has no cervical adenopathy.  Neurological: She is alert and oriented to person, place, and time. She has normal strength. No cranial nerve deficit or sensory deficit. GCS eye subscore is 4. GCS verbal subscore is 5. GCS motor subscore is 6.  Skin: Skin is warm, dry and intact. She is not diaphoretic.  Psychiatric: She has a normal mood  and affect. Her speech is normal and behavior is normal.     Assessment/Plan 73 year old female with likely partial small bowel obstruction history of biliary colic.  1. We will admit the patient, patient has NG tube placed, keep the patient n.p.o., and start IV fluids. 2. I discussed the patient that we will obtain a KUB in the morning to dilate for possible resolution. Should this be the case. Possible the patient did be able to be discharged and followup for her planned surgery.  Rosario Jacks., Jabar Krysiak 08/14/2013, 10:35 PM

## 2013-08-14 NOTE — ED Notes (Signed)
Pt reports hx of SBO.  Reports pain that has increased over the last hour.  Pt having her gallbladder removed on Tuesday.  Nausea, no vomiting.  LBM: yesterday-normal.

## 2013-08-14 NOTE — ED Provider Notes (Signed)
Patient is a 73 year old female with history of prior small bowel obstruction. She was sent today for [facility] for further workup of obstruction. An NG tube was placed and the patient is feeling somewhat less pressure. On exam, vitals are stable and the patient is afebrile. Heart is regular rate and rhythm and lungs are clear. Abdomen is soft. There is mild generalized tenderness to palpation without rebound or guarding. Extremities are without edema.  I've spoken with Dr. Rosendo Gros from general surgery who will see the patient in the emergency department.  Veryl Speak, MD 08/14/13 2207

## 2013-08-15 ENCOUNTER — Encounter (HOSPITAL_COMMUNITY): Payer: Self-pay | Admitting: *Deleted

## 2013-08-15 ENCOUNTER — Inpatient Hospital Stay (HOSPITAL_COMMUNITY): Payer: Medicare Other

## 2013-08-15 DIAGNOSIS — K56609 Unspecified intestinal obstruction, unspecified as to partial versus complete obstruction: Secondary | ICD-10-CM | POA: Diagnosis present

## 2013-08-15 MED ORDER — ALBUTEROL SULFATE (2.5 MG/3ML) 0.083% IN NEBU
2.5000 mg | INHALATION_SOLUTION | Freq: Three times a day (TID) | RESPIRATORY_TRACT | Status: DC
  Administered 2013-08-15 – 2013-08-18 (×10): 2.5 mg via RESPIRATORY_TRACT
  Filled 2013-08-15 (×10): qty 3

## 2013-08-15 MED ORDER — FLEET ENEMA 7-19 GM/118ML RE ENEM
1.0000 | ENEMA | Freq: Once | RECTAL | Status: AC
  Administered 2013-08-15: 17:00:00 via RECTAL
  Filled 2013-08-15: qty 1

## 2013-08-15 MED ORDER — ONDANSETRON HCL 4 MG/2ML IJ SOLN
4.0000 mg | Freq: Four times a day (QID) | INTRAMUSCULAR | Status: DC | PRN
  Administered 2013-08-15 – 2013-08-17 (×4): 4 mg via INTRAVENOUS
  Filled 2013-08-15 (×4): qty 2

## 2013-08-15 MED ORDER — IOHEXOL 300 MG/ML  SOLN
25.0000 mL | INTRAMUSCULAR | Status: AC
  Administered 2013-08-15 (×2): 25 mL via ORAL

## 2013-08-15 MED ORDER — IOHEXOL 300 MG/ML  SOLN
80.0000 mL | Freq: Once | INTRAMUSCULAR | Status: AC | PRN
  Administered 2013-08-15: 80 mL via INTRAVENOUS

## 2013-08-15 MED ORDER — MORPHINE SULFATE 2 MG/ML IJ SOLN
1.0000 mg | INTRAMUSCULAR | Status: DC | PRN
  Administered 2013-08-15: 2 mg via INTRAVENOUS
  Administered 2013-08-15: 4 mg via INTRAVENOUS
  Administered 2013-08-16 – 2013-08-18 (×5): 2 mg via INTRAVENOUS
  Filled 2013-08-15 (×8): qty 1

## 2013-08-15 MED ORDER — BENAZEPRIL HCL 40 MG PO TABS
40.0000 mg | ORAL_TABLET | Freq: Every day | ORAL | Status: DC
  Administered 2013-08-16 – 2013-08-19 (×2): 40 mg via ORAL
  Filled 2013-08-15 (×5): qty 1

## 2013-08-15 MED ORDER — AMLODIPINE BESYLATE 5 MG PO TABS
5.0000 mg | ORAL_TABLET | Freq: Every day | ORAL | Status: DC
  Administered 2013-08-16 – 2013-08-19 (×2): 5 mg via ORAL
  Filled 2013-08-15 (×5): qty 1

## 2013-08-15 NOTE — Progress Notes (Signed)
INITIAL NUTRITION ASSESSMENT  DOCUMENTATION CODES Per approved criteria  -Severe malnutrition in the context of chronic illness -Underweight   INTERVENTION:  Diet advancement as able per MD. Recommend regular diet with Ensure Complete PO TID, each supplement provides 350 kcal and 13 grams of protein  NUTRITION DIAGNOSIS: Inadequate oral intake related to altered GI function as evidenced by NPO status.   Goal: Intake to meet >90% of estimated nutrition needs.  Monitor:  PO intake, labs, weight trend.  Reason for Assessment: MST  73 y.o. female  Admitting Dx: Abdominal pain, SBO  ASSESSMENT: Patient is a 73 year old female who is scheduled for a laparoscopic cholecystectomy per Dr. Ninfa Linden. The patient presented on 1/23 with a 12-18 hour history of abdominal pain with nausea. The patient stated that it feels similar to her previous episode of the small bowel obstruction. Patient was seen at an outside hospital where a KUB which showed dilated loops of bowel likely consistent with a partial small bowel obstruction.  Nutrition Focused Physical Exam:  Subcutaneous Fat:  Orbital Region: severe depletion Upper Arm Region: severe depletion Thoracic and Lumbar Region: severe depletion  Muscle:  Temple Region: severe depletion Clavicle Bone Region: severe depletion Clavicle and Acromion Bone Region: severe depletion Scapular Bone Region: severe depletion Dorsal Hand: severe depletion Patellar Region: severe depletion Anterior Thigh Region: severe depletion Posterior Calf Region: severe depletion  Edema: none  Pt meets criteria for severe MALNUTRITION in the context of chronic illness as evidenced by 27% weight loss in 1 year with severe depletion of muscle and subcutaneous fat mass.  Height: Ht Readings from Last 1 Encounters:  08/15/13 5\' 4"  (1.626 m)    Weight: Wt Readings from Last 1 Encounters:  08/15/13 85 lb 14.4 oz (38.964 kg)    Ideal Body Weight: 54.5 kg  %  Ideal Body Weight: 71%  Wt Readings from Last 10 Encounters:  08/15/13 85 lb 14.4 oz (38.964 kg)  08/12/13 88 lb 12.8 oz (40.279 kg)  07/27/13 88 lb (39.917 kg)  07/09/13 86 lb 3.2 oz (39.1 kg)  07/01/13 90 lb (40.824 kg)  06/29/13 91 lb (41.277 kg)  06/16/13 91 lb (41.277 kg)  05/28/13 92 lb (41.731 kg)  05/18/13 91 lb (41.277 kg)  05/15/13 92 lb (41.731 kg)    Usual Body Weight: 92 lb 3 months ago; 116 lb 1 year ago  % Usual Body Weight: 92%, 73%  BMI:  Body mass index is 14.74 kg/(m^2). Underweight  Estimated Nutritional Needs: Kcal: 1350-1550 Protein: 70-80 gm Fluid: 1.4-1.6 L  Skin: no wounds  Diet Order: NPO  EDUCATION NEEDS: -No education needs identified at this time   Intake/Output Summary (Last 24 hours) at 08/15/13 1117 Last data filed at 08/15/13 0536  Gross per 24 hour  Intake    270 ml  Output    250 ml  Net     20 ml    Last BM: 1/23   Labs:   Recent Labs Lab 08/12/13 1448 08/14/13 1739  NA 139 138  K 4.8 4.2  CL 100 93*  CO2 27 29  BUN 13 10  CREATININE 0.58 0.70  CALCIUM 8.9 9.8  GLUCOSE 99 106*    CBG (last 3)  No results found for this basename: GLUCAP,  in the last 72 hours  Scheduled Meds: . albuterol  2.5 mg Nebulization TID  . amLODipine  5 mg Oral Daily  . beclomethasone  2 puff Inhalation BID  . benazepril  40 mg Oral Daily  .  tiotropium  18 mcg Inhalation Daily    Continuous Infusions: . dextrose 5 % and 0.9% NaCl 75 mL/hr at 08/15/13 0200    Past Medical History  Diagnosis Date  . COPD (chronic obstructive pulmonary disease)     FVC 72%, FEV1 29%, FEV1 ratio 32% (very severe (COPD)  . Hypertension   . Pneumonia   . DVT (deep venous thrombosis)     "years ago after a surgery"  . Kidney stone   . Pinched nerve     in back  . Peptic ulcer   . Colon polyp     removed, benign  . H/O hiatal hernia   . Degenerative disc disease   . Anemia     as a child    Past Surgical History  Procedure Laterality  Date  . Abdominal hysterectomy  1987    and one ovary  . Oophorectomy  1992  . Cataract extraction w/phaco  06/12/2012    Dr. Boykin Reaper, left eye  . Cataract extraction  06/22/2012    Right eye, Dr. Lester Kinsman.   Marland Kitchen Urethra surgery    . Small intestine surgery      for a blockage, no bowel was removed, just kinked from scar tissue    Molli Barrows, RD, LDN, [place] Pager [phone] After Hours Pager [phone]

## 2013-08-15 NOTE — ED Notes (Signed)
Pt report to [facility].  Pt to floor with EDT accompanying.

## 2013-08-15 NOTE — Progress Notes (Signed)
Subjective: Comfortable No flatus  Objective: Vital signs in last 24 hours: Temp:  [98.2 F (36.8 C)-99 F (37.2 C)] 98.5 F (36.9 C) (01/24 0535) Pulse Rate:  [66-96] 78 (01/24 0535) Resp:  [12-24] 20 (01/24 0535) BP: (122-165)/(48-67) 125/53 mmHg (01/24 1047) SpO2:  [91 %-100 %] 98 % (01/24 0535) Weight:  [85 lb 14.4 oz (38.964 kg)-87 lb (39.463 kg)] 85 lb 14.4 oz (38.964 kg) (01/24 0200) Last BM Date: 08-24-2013 (per pt)  Intake/Output from previous day: 24-Aug-2022 0701 - 01/24 0700 In: 270 [I.V.:270] Out: 250 [Emesis/NG output:250] Intake/Output this shift:    Abdomen soft, non-tender  Lab Results:   Recent Labs  08/12/13 1448 24-Aug-2013 1739  WBC 6.9 11.7*  HGB 13.8 15.1*  HCT 40.5 43.9  PLT 288 340   BMET  Recent Labs  08/12/13 1448 2013-08-24 1739  NA 139 138  K 4.8 4.2  CL 100 93*  CO2 27 29  GLUCOSE 99 106*  BUN 13 10  CREATININE 0.58 0.70  CALCIUM 8.9 9.8   PT/INR No results found for this basename: LABPROT, INR,  in the last 72 hours ABG No results found for this basename: PHART, PCO2, PO2, HCO3,  in the last 72 hours  Studies/Results: Dg Abd 2 Views  08-24-2013   CLINICAL DATA:  Follow-up small bowel obstruction.  EXAM: ABDOMEN - 2 VIEW  COMPARISON:  Abdominal radiographs performed earlier today at 5:59 p.m.  FINDINGS: There has been interval placement of an enteric tube, which is seen coiling at the fundus of the stomach and ending at the body of the stomach.  A distended small bowel loop is again noted at the right hemipelvis, measuring 4.4 cm in diameter. The previously noted dilated small bowel loop on the left side has resolved. As before, this raises concern for some degree of underlying small bowel obstruction. Residual stool within the colon suggests against high-grade or closed loop obstruction. No free intra-abdominal air is identified on the provided upright view.  No acute osseous abnormalities are seen.  IMPRESSION: 1. Distended small bowel  loop again noted at the right hemipelvis; this is mildly increased in prominence, now measuring 4.4 cm in diameter. The dilated small bowel loop on the left side has resolved. As before, findings raise concern for some degree of underlying small bowel obstruction. Residual stool within the colon suggests against high-grade or closed loop obstruction, though would correlate with the patient's symptoms. No free intra-abdominal air seen. 2. Enteric tube seen ending at the body of the stomach.   Electronically Signed   By: Garald Balding M.D.   On: August 24, 2013 23:59   Dg Abd Acute W/chest  08/24/13   CLINICAL DATA:  Abdominal pain  EXAM: ACUTE ABDOMEN SERIES (ABDOMEN 2 VIEW & CHEST 1 VIEW)  COMPARISON:  DG CHEST 2 VIEW dated 07/27/2013; DG ABDOMEN 1V dated 11/21/2012  FINDINGS: Lungs remain hyperaerated. Chronic interstitial and bronchitic changes persist.  There is no free intraperitoneal gas. Nonspecific air-fluid levels are noted. Borderline distended small bowel loops project over the lower abdomen scoliosis and degenerative changes in the lumbar spine. Moderate stool in the distal colon. Phleboliths project over the pelvis.  IMPRESSION: Early partial small bowel obstruction pattern.  Chronic pulmonary parenchymal disease.   Electronically Signed   By: Maryclare Bean M.D.   On: 08-24-13 18:13    Anti-infectives: Anti-infectives   None      Assessment/Plan: s/p * No surgery found *  SBO  This is unrelated to her biliary  dyskinesia.  She was supposed to have a lap chole next week. She had an exploratory laparotomy in June 2014 in [place] for an SBO.  I think she needs a CT scan to evaluate the obstruction  LOS: 1 day    Lavaris Sexson A 08/15/2013

## 2013-08-16 LAB — SURGICAL PCR SCREEN
MRSA, PCR: NEGATIVE
STAPHYLOCOCCUS AUREUS: NEGATIVE

## 2013-08-16 NOTE — Progress Notes (Signed)
Subjective: No complaints this morning Denies abdominal pain  Objective: Vital signs in last 24 hours: Temp:  [98.6 F (37 C)-99.4 F (37.4 C)] 98.6 F (37 C) (01/25 0510) Pulse Rate:  [69-96] 96 (01/25 0510) Resp:  [17-18] 18 (01/25 0510) BP: (118-147)/(51-63) 147/63 mmHg (01/25 0510) SpO2:  [96 %-99 %] 96 % (01/25 0510) FiO2 (%):  [28 %] 28 % (01/24 1428) Last BM Date: 08/14/13  Intake/Output from previous day: 01/24 0701 - 01/25 0700 In: 1511.7 [I.V.:861.7; NG/GT:650] Out: 2150 [Urine:1200; Emesis/NG output:950] Intake/Output this shift: Total I/O In: -  Out: 1300 [Urine:800; Emesis/NG output:500]  Abdomen soft, non tender  Lab Results:   Recent Labs  08/14/13 1739  WBC 11.7*  HGB 15.1*  HCT 43.9  PLT 340   BMET  Recent Labs  08/14/13 1739  NA 138  K 4.2  CL 93*  CO2 29  GLUCOSE 106*  BUN 10  CREATININE 0.70  CALCIUM 9.8   PT/INR No results found for this basename: LABPROT, INR,  in the last 72 hours ABG No results found for this basename: PHART, PCO2, PO2, HCO3,  in the last 72 hours  Studies/Results: Ct Abdomen Pelvis W Contrast  08/15/2013   CLINICAL DATA:  Abdominal pain.  Nausea.  Constipation.  EXAM: CT ABDOMEN AND PELVIS WITH CONTRAST  TECHNIQUE: Multidetector CT imaging of the abdomen and pelvis was performed using the standard protocol following bolus administration of intravenous contrast.  CONTRAST:  33mL OMNIPAQUE IOHEXOL 300 MG/ML  SOLN  COMPARISON:  Noncontrast CT on 04/29/2013  FINDINGS: Several small hepatic cysts are noted in both right and left lobes. In addition, there is Adams hypervascular lesion in the inferior right hepatic lobe measuring approximately 1.4 cm on image 36. This has Adams nonspecific enhancement pattern, but may represent Adams flash filling hemangioma as it appears to retain contrast on delayed images. No other liver masses are identified.  The gallbladder, pancreas, spleen, adrenal glands, and kidneys are normal in  appearance. No evidence hydronephrosis. No lymphadenopathy identified within the abdomen or pelvis.  Prior hysterectomy noted. Adnexal regions are unremarkable. No evidence of inflammatory process or abnormal fluid collections. Sigmoid diverticulosis again demonstrated, however there is no evidence of diverticulitis. No evidence of bowel wall thickening or dilatation. Nasogastric tube is seen with tip in the body the stomach.  IMPRESSION: Diverticulosis. No radiographic evidence of diverticulitis or other acute findings.  1.4 cm hypervascular mass in the inferior right hepatic lobe. 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 Abdomen MRI without and with contrast recommended for further characterization.   Electronically Signed   By: Earle Gell M.D.   On: 08/15/2013 17:48   Dg Abd 2 Views  08/14/2013   CLINICAL DATA:  Follow-up small bowel obstruction.  EXAM: ABDOMEN - 2 VIEW  COMPARISON:  Abdominal radiographs performed earlier today at 5:59 p.m.  FINDINGS: There has been interval placement of an enteric tube, which is seen coiling at the fundus of the stomach and ending at the body of the stomach.  Adams distended small bowel loop is again noted at the right hemipelvis, measuring 4.4 cm in diameter. The previously noted dilated small bowel loop on the left side has resolved. As before, this raises concern for some degree of underlying small bowel obstruction. Residual stool within the colon suggests against high-grade or closed loop obstruction. No free intra-abdominal air is identified on the provided upright view.  No acute osseous abnormalities are seen.  IMPRESSION: 1. Distended small bowel loop again noted at the right hemipelvis; this is mildly increased in prominence, now measuring 4.4 cm in diameter. The dilated small bowel loop on the left side has resolved. As before, findings raise concern for some degree of underlying small bowel obstruction.  Residual stool within the colon suggests against high-grade or closed loop obstruction, though would correlate with the patient's symptoms. No free intra-abdominal air seen. 2. Enteric tube seen ending at the body of the stomach.   Electronically Signed   By: Garald Balding M.D.   On: 08/14/2013 23:59   Dg Abd Acute W/chest  08/14/2013   CLINICAL DATA:  Abdominal pain  EXAM: ACUTE ABDOMEN SERIES (ABDOMEN 2 VIEW & CHEST 1 VIEW)  COMPARISON:  DG CHEST 2 VIEW dated 07/27/2013; DG ABDOMEN 1V dated 11/21/2012  FINDINGS: Lungs remain hyperaerated. Chronic interstitial and bronchitic changes persist.  There is no free intraperitoneal gas. Nonspecific air-fluid levels are noted. Borderline distended small bowel loops project over the lower abdomen scoliosis and degenerative changes in the lumbar spine. Moderate stool in the distal colon. Phleboliths project over the pelvis.  IMPRESSION: Early partial small bowel obstruction pattern.  Chronic pulmonary parenchymal disease.   Electronically Signed   By: Maryclare Bean M.D.   On: 08/14/2013 18:13    Anti-infectives: Anti-infectives   None      Assessment/Plan: s/p * No surgery found *  pSBO vs Ileus vs biliary dyskinesia  Will d/c NG with CT showing no obstruction. Unless something changes, will plan on proceeding with her Lap Chole tuesday  LOS: 2 days    Tamara Adams 08/16/2013

## 2013-08-17 ENCOUNTER — Telehealth (INDEPENDENT_AMBULATORY_CARE_PROVIDER_SITE_OTHER): Payer: Self-pay

## 2013-08-17 MED ORDER — ACETAMINOPHEN 325 MG PO TABS
650.0000 mg | ORAL_TABLET | Freq: Four times a day (QID) | ORAL | Status: DC | PRN
  Administered 2013-08-17: 650 mg via ORAL
  Filled 2013-08-17: qty 2

## 2013-08-17 MED ORDER — CEFAZOLIN (ANCEF) 1 G IV SOLR: 2.0000 g | INTRAVENOUS | Status: DC

## 2013-08-17 MED ORDER — CEFAZOLIN SODIUM-DEXTROSE 2-3 GM-% IV SOLR
2.0000 g | INTRAVENOUS | Status: AC
  Administered 2013-08-18: 2 g via INTRAVENOUS
  Filled 2013-08-17: qty 50

## 2013-08-17 NOTE — Progress Notes (Signed)
  Subjective: Other than some RUQ abdominal discomfort, she has improved Tolerating liquids  Objective: Vital signs in last 24 hours: Temp:  [98.3 F (36.8 C)-98.6 F (37 C)] 98.3 F (36.8 C) (01/26 0604) Pulse Rate:  [72-85] 72 (01/26 0604) Resp:  [18-20] 20 (01/26 0604) BP: (125-140)/(58-64) 127/58 mmHg (01/26 0604) SpO2:  [95 %-98 %] 97 % (01/26 0604) Last BM Date: 08/14/13  Intake/Output from previous day: 01/25 0701 - 01/26 0700 In: 5040 [P.O.:1320; I.V.:3720] Out: 3000 [Urine:3000] Intake/Output this shift:   Comfortable Lungs clear Abdomen soft, non tender, non distended  Lab Results:   Recent Labs  08/14/13 1739  WBC 11.7*  HGB 15.1*  HCT 43.9  PLT 340   BMET  Recent Labs  08/14/13 1739  NA 138  K 4.2  CL 93*  CO2 29  GLUCOSE 106*  BUN 10  CREATININE 0.70  CALCIUM 9.8   PT/INR No results found for this basename: LABPROT, INR,  in the last 72 hours ABG No results found for this basename: PHART, PCO2, PO2, HCO3,  in the last 72 hours  Studies/Results: Ct Abdomen Pelvis W Contrast  08/15/2013   CLINICAL DATA:  Abdominal pain.  Nausea.  Constipation.  EXAM: CT ABDOMEN AND PELVIS WITH CONTRAST  TECHNIQUE: Multidetector CT imaging of the abdomen and pelvis was performed using the standard protocol following bolus administration of intravenous contrast.  CONTRAST:  71mL OMNIPAQUE IOHEXOL 300 MG/ML  SOLN  COMPARISON:  Noncontrast CT on 04/29/2013  FINDINGS: Several small hepatic cysts are noted in both right and left lobes. In addition, there is a hypervascular lesion in the inferior right hepatic lobe measuring approximately 1.4 cm on image 36. This has a nonspecific enhancement pattern, but may represent a flash filling hemangioma as it appears to retain contrast on delayed images. No other liver masses are identified.  The gallbladder, pancreas, spleen, adrenal glands, and kidneys are normal in appearance. No evidence hydronephrosis. No lymphadenopathy  identified within the abdomen or pelvis.  Prior hysterectomy noted. Adnexal regions are unremarkable. No evidence of inflammatory process or abnormal fluid collections. Sigmoid diverticulosis again demonstrated, however there is no evidence of diverticulitis. No evidence of bowel wall thickening or dilatation. Nasogastric tube is seen with tip in the body the stomach.  IMPRESSION: Diverticulosis. No radiographic evidence of diverticulitis or other acute findings.  1.4 cm hypervascular mass in the inferior right hepatic lobe. This may represent a benign flash filling hemangioma, although findings on CT remain nonspecific. Abdomen MRI without and with contrast recommended for further characterization.   Electronically Signed   By: Earle Gell M.D.   On: 08/15/2013 17:48    Anti-infectives: Anti-infectives   None      Assessment/Plan: s/p * No surgery found *  Resolved pSBO  This may have just been an ileus secondary to her biliary dyskinesia.  Will plan to proceed with Lap Chole tomorrow.  I again discussed all the risks of surgery with her in detail.  She agrees to proceed.  LOS: 3 days    Kitt Ledet A 08/17/2013

## 2013-08-17 NOTE — Clinical Documentation Improvement (Signed)
THIS DOCUMENT IS NOT A PERMANENT PART OF THE MEDICAL RECORD  Please update your documentation with the medical record to reflect your response to this query. If you need help knowing how to do this please call [phone].  08/17/13   Dear Dr. Evlyn Courier / Associates,  In a better effort to capture your patient's severity of illness, reflect appropriate length of stay and utilization of resources, a review of the patient medical record has revealed the following indicators.    Based on your clinical judgment, please clarify and document in a progress note and/or discharge summary the clinical condition associated with the following supporting information:  In responding to this query please exercise your independent judgment.  The fact that a query is asked, does not imply that any particular answer is desired or expected.  Possible Clinical Conditions?  Mild Malnutrition  Moderate Malnutrition Severe Malnutrition   Protein Calorie Malnutrition Severe Protein Calorie Malnutrition Other Condition Cannot clinically determine  Supporting Information:  Risk Factors: Aditting Dx: Abdominal pain, SBO ASSESSMENT: Patient is a 73 year old female who is scheduled for a laparoscopic cholecystectomy per Dr. Ninfa Linden. The patient presented on 1/23 with a 12-18 hour history of abdominal pain with nausea. The patient stated that it feels similar to her previous episode of the small bowel obstruction. Patient was seen at an outside hospital where a KUB which showed dilated loops of bowel likely consistent with a partial small bowel obstruction.  Signs & Symptoms: Ht :  5\' 4"     Wt: 85 lb 14.4 oz  BMI: 14.74 kg/(m^2)   Diagnostics: DOCUMENTATION CODES  Per approved criteria  -Severe malnutrition in the context of chronic illness  -Underweight    Treatment INTERVENTION:  Diet advancement as able per MD. Recommend regular diet with Ensure Complete PO TID, each supplement provides 350 kcal  and 13 grams of protein   Nutrition Consult by Dalene Carrow, RD at 08/15/2013    You may use possible, probable, or suspect with inpatient documentation. possible, probable, suspected diagnoses MUST be documented at the time of discharge  Reviewed: additional documentation in the medical record at discharge  Thank You,  Alessandra Grout RN, BSN, CCDS, Clinical Documentation Specialist: [phone] Cell= [place]

## 2013-08-17 NOTE — Progress Notes (Signed)
UR completed.  Yulonda Wheeling, RN BSN [facility] CCM Trauma/Neuro ICU Case Manager [phone]  

## 2013-08-18 ENCOUNTER — Inpatient Hospital Stay (HOSPITAL_COMMUNITY): Payer: Medicare Other | Admitting: Certified Registered Nurse Anesthetist

## 2013-08-18 ENCOUNTER — Ambulatory Visit (HOSPITAL_COMMUNITY): Admission: RE | Admit: 2013-08-18 | Payer: Medicare Other | Source: Ambulatory Visit | Admitting: Surgery

## 2013-08-18 ENCOUNTER — Encounter (HOSPITAL_COMMUNITY): Payer: Self-pay | Admitting: Certified Registered Nurse Anesthetist

## 2013-08-18 ENCOUNTER — Encounter (HOSPITAL_COMMUNITY): Payer: Medicare Other | Admitting: Vascular Surgery

## 2013-08-18 ENCOUNTER — Encounter (HOSPITAL_COMMUNITY)
Admission: EM | Disposition: A | Discharge status: Home / Self Care | Payer: Self-pay | Source: Home / Self Care | Attending: Surgery

## 2013-08-18 DIAGNOSIS — K811 Chronic cholecystitis: Secondary | ICD-10-CM

## 2013-08-18 HISTORY — PX: CHOLECYSTECTOMY: SHX55

## 2013-08-18 SURGERY — LAPAROSCOPIC CHOLECYSTECTOMY
Anesthesia: General | Site: Abdomen

## 2013-08-18 MED ORDER — PHENYLEPHRINE HCL 10 MG/ML IJ SOLN
INTRAMUSCULAR | Status: DC | PRN
  Administered 2013-08-18 (×2): 40 ug via INTRAVENOUS

## 2013-08-18 MED ORDER — NEOSTIGMINE METHYLSULFATE 1 MG/ML IJ SOLN
INTRAMUSCULAR | Status: DC | PRN
  Administered 2013-08-18: 3 mg via INTRAVENOUS

## 2013-08-18 MED ORDER — LACTATED RINGERS IV SOLN
INTRAVENOUS | Status: DC
  Administered 2013-08-18: 09:00:00 via INTRAVENOUS

## 2013-08-18 MED ORDER — FENTANYL CITRATE 0.05 MG/ML IJ SOLN
25.0000 ug | INTRAMUSCULAR | Status: DC | PRN
  Administered 2013-08-18 (×2): 25 ug via INTRAVENOUS

## 2013-08-18 MED ORDER — LIDOCAINE HCL (CARDIAC) 20 MG/ML IV SOLN
INTRAVENOUS | Status: DC | PRN
  Administered 2013-08-18: 70 mg via INTRAVENOUS

## 2013-08-18 MED ORDER — FENTANYL CITRATE 0.05 MG/ML IJ SOLN
INTRAMUSCULAR | Status: AC
  Filled 2013-08-18: qty 2

## 2013-08-18 MED ORDER — OXYCODONE HCL 5 MG PO TABS
10.0000 mg | ORAL_TABLET | ORAL | Status: DC | PRN
  Administered 2013-08-18 – 2013-08-19 (×3): 10 mg via ORAL
  Filled 2013-08-18 (×3): qty 2

## 2013-08-18 MED ORDER — ONDANSETRON HCL 4 MG/2ML IJ SOLN: 4.0000 mg | Freq: Four times a day (QID) | INTRAMUSCULAR | Status: DC | PRN

## 2013-08-18 MED ORDER — ROCURONIUM BROMIDE 100 MG/10ML IV SOLN
INTRAVENOUS | Status: DC | PRN
  Administered 2013-08-18: 20 mg via INTRAVENOUS

## 2013-08-18 MED ORDER — BUPIVACAINE-EPINEPHRINE 0.25% -1:200000 IJ SOLN
INTRAMUSCULAR | Status: DC | PRN
  Administered 2013-08-18: 20 mL

## 2013-08-18 MED ORDER — PROPOFOL 10 MG/ML IV BOLUS
INTRAVENOUS | Status: DC | PRN
  Administered 2013-08-18: 120 mg via INTRAVENOUS

## 2013-08-18 MED ORDER — LACTATED RINGERS IV SOLN
INTRAVENOUS | Status: DC | PRN
  Administered 2013-08-18 (×2): via INTRAVENOUS

## 2013-08-18 MED ORDER — PHENYLEPHRINE HCL 10 MG/ML IJ SOLN
10.0000 mg | INTRAVENOUS | Status: DC | PRN
  Administered 2013-08-18: 20 ug/min via INTRAVENOUS

## 2013-08-18 MED ORDER — SUCCINYLCHOLINE CHLORIDE 20 MG/ML IJ SOLN
INTRAMUSCULAR | Status: DC | PRN
  Administered 2013-08-18: 100 mg via INTRAVENOUS

## 2013-08-18 MED ORDER — CEFAZOLIN SODIUM-DEXTROSE 2-3 GM-% IV SOLR
INTRAVENOUS | Status: DC | PRN
  Administered 2013-08-18: 2 g via INTRAVENOUS

## 2013-08-18 MED ORDER — BUPIVACAINE-EPINEPHRINE (PF) 0.25% -1:200000 IJ SOLN
INTRAMUSCULAR | Status: AC
  Filled 2013-08-18: qty 30

## 2013-08-18 MED ORDER — SODIUM CHLORIDE 0.9 % IR SOLN
Status: DC | PRN
  Administered 2013-08-18: 1000 mL

## 2013-08-18 MED ORDER — GLYCOPYRROLATE 0.2 MG/ML IJ SOLN
INTRAMUSCULAR | Status: DC | PRN
  Administered 2013-08-18: 0.4 mg via INTRAVENOUS

## 2013-08-18 MED ORDER — ONDANSETRON HCL 4 MG/2ML IJ SOLN
INTRAMUSCULAR | Status: DC | PRN
  Administered 2013-08-18: 4 mg via INTRAVENOUS

## 2013-08-18 MED ORDER — FENTANYL CITRATE 0.05 MG/ML IJ SOLN
INTRAMUSCULAR | Status: DC | PRN
Start: 2013-08-18 — End: 2013-08-18
  Administered 2013-08-18: 25 ug via INTRAVENOUS
  Administered 2013-08-18: 75 ug via INTRAVENOUS

## 2013-08-18 MED ORDER — CEFAZOLIN SODIUM-DEXTROSE 2-3 GM-% IV SOLR: 2.0000 g | INTRAVENOUS | Status: AC

## 2013-08-18 SURGICAL SUPPLY — 45 items
APPLIER CLIP 5 13 M/L LIGAMAX5 (MISCELLANEOUS) ×3
BANDAGE ADHESIVE 1X3 (GAUZE/BANDAGES/DRESSINGS) ×12 IMPLANT
BENZOIN TINCTURE PRP APPL 2/3 (GAUZE/BANDAGES/DRESSINGS) ×3 IMPLANT
CANISTER SUCTION 2500CC (MISCELLANEOUS) ×3 IMPLANT
CHLORAPREP W/TINT 26ML (MISCELLANEOUS) ×3 IMPLANT
CLIP APPLIE 5 13 M/L LIGAMAX5 (MISCELLANEOUS) ×1 IMPLANT
CLOSURE STERI-STRIP 1/2X4 (GAUZE/BANDAGES/DRESSINGS) ×1
CLSR STERI-STRIP ANTIMIC 1/2X4 (GAUZE/BANDAGES/DRESSINGS) ×2 IMPLANT
COVER MAYO STAND STRL (DRAPES) IMPLANT
COVER SURGICAL LIGHT HANDLE (MISCELLANEOUS) ×3 IMPLANT
DECANTER SPIKE VIAL GLASS SM (MISCELLANEOUS) IMPLANT
DRAPE C-ARM 42X72 X-RAY (DRAPES) IMPLANT
DRAPE UTILITY 15X26 W/TAPE STR (DRAPE) ×6 IMPLANT
ELECT REM PT RETURN 9FT ADLT (ELECTROSURGICAL) ×3
ELECTRODE REM PT RTRN 9FT ADLT (ELECTROSURGICAL) ×1 IMPLANT
GLOVE BIO SURGEON STRL SZ7.5 (GLOVE) ×3 IMPLANT
GLOVE BIOGEL PI IND STRL 6.5 (GLOVE) ×1 IMPLANT
GLOVE BIOGEL PI IND STRL 7.0 (GLOVE) ×1 IMPLANT
GLOVE BIOGEL PI IND STRL 7.5 (GLOVE) ×2 IMPLANT
GLOVE BIOGEL PI INDICATOR 6.5 (GLOVE) ×2
GLOVE BIOGEL PI INDICATOR 7.0 (GLOVE) ×2
GLOVE BIOGEL PI INDICATOR 7.5 (GLOVE) ×4
GLOVE SURG SIGNA 7.5 PF LTX (GLOVE) ×3 IMPLANT
GLOVE SURG SS PI 7.5 STRL IVOR (GLOVE) ×3 IMPLANT
GOWN STRL NON-REIN LRG LVL3 (GOWN DISPOSABLE) ×9 IMPLANT
GOWN STRL REIN XL XLG (GOWN DISPOSABLE) ×3 IMPLANT
KIT BASIN OR (CUSTOM PROCEDURE TRAY) ×3 IMPLANT
KIT ROOM TURNOVER OR (KITS) ×3 IMPLANT
NS IRRIG 1000ML POUR BTL (IV SOLUTION) ×3 IMPLANT
PAD ARMBOARD 7.5X6 YLW CONV (MISCELLANEOUS) ×3 IMPLANT
POUCH SPECIMEN RETRIEVAL 10MM (ENDOMECHANICALS) ×3 IMPLANT
SCISSORS LAP 5X35 DISP (ENDOMECHANICALS) ×3 IMPLANT
SET CHOLANGIOGRAPH 5 50 .035 (SET/KITS/TRAYS/PACK) IMPLANT
SET IRRIG TUBING LAPAROSCOPIC (IRRIGATION / IRRIGATOR) ×3 IMPLANT
SLEEVE ENDOPATH XCEL 5M (ENDOMECHANICALS) ×6 IMPLANT
SPECIMEN JAR SMALL (MISCELLANEOUS) ×3 IMPLANT
SUT MON AB 4-0 PC3 18 (SUTURE) ×3 IMPLANT
TOWEL OR 17X24 6PK STRL BLUE (TOWEL DISPOSABLE) ×3 IMPLANT
TOWEL OR 17X26 10 PK STRL BLUE (TOWEL DISPOSABLE) ×3 IMPLANT
TOWEL OR NON WOVEN STRL DISP B (DISPOSABLE) ×3 IMPLANT
TRAY LAPAROSCOPIC (CUSTOM PROCEDURE TRAY) ×3 IMPLANT
TROCAR XCEL BLUNT TIP 100MML (ENDOMECHANICALS) IMPLANT
TROCAR XCEL NON-BLD 11X100MML (ENDOMECHANICALS) ×3 IMPLANT
TROCAR XCEL NON-BLD 5MMX100MML (ENDOMECHANICALS) ×3 IMPLANT
WATER STERILE IRR 1000ML POUR (IV SOLUTION) IMPLANT

## 2013-08-18 NOTE — Preoperative (Signed)
Beta Blockers   Reason not to administer Beta Blockers:Not Applicable 

## 2013-08-18 NOTE — Transfer of Care (Signed)
Immediate Anesthesia Transfer of Care Note  Patient: Tamara Adams  Procedure(s) Performed: Procedure(s): LAPAROSCOPIC CHOLECYSTECTOMY (N/A)  Patient Location: PACU  Anesthesia Type:General  Level of Consciousness: awake and alert   Airway & Oxygen Therapy: Patient Spontanous Breathing and Patient connected to nasal cannula oxygen  Post-op Assessment: Report given to PACU RN, Post -op Vital signs reviewed and stable and Patient moving all extremities X 4  Post vital signs: Reviewed and stable  Complications: No apparent anesthesia complications

## 2013-08-18 NOTE — Anesthesia Preprocedure Evaluation (Addendum)
Anesthesia Evaluation  Patient identified by MRN, date of birth, ID band Patient awake    Reviewed: Allergy & Precautions, H&P , NPO status , Patient's Chart, lab work & pertinent test results  Airway Mallampati: II  Neck ROM: full    Dental  (+) Dental Advisory Given, Edentulous Upper and Edentulous Lower   Pulmonary COPD COPD inhaler and oxygen dependent, Current Smoker,          Cardiovascular hypertension, Pt. on medications     Neuro/Psych    GI/Hepatic hiatal hernia, PUD,   Endo/Other    Renal/GU      Musculoskeletal   Abdominal   Peds  Hematology   Anesthesia Other Findings   Reproductive/Obstetrics                        Anesthesia Physical Anesthesia Plan  ASA: III  Anesthesia Plan: General   Post-op Pain Management:    Induction: Intravenous  Airway Management Planned: Oral ETT  Additional Equipment:   Intra-op Plan:   Post-operative Plan: Extubation in OR  Informed Consent: I have reviewed the patients History and Physical, chart, labs and discussed the procedure including the risks, benefits and alternatives for the proposed anesthesia with the patient or authorized representative who has indicated his/her understanding and acceptance.   Dental advisory given  Plan Discussed with: CRNA, Anesthesiologist and Surgeon  Anesthesia Plan Comments:         Anesthesia Quick Evaluation

## 2013-08-18 NOTE — Op Note (Signed)
Laparoscopic Cholecystectomy Procedure Note  Indications: This patient presents with symptomatic gallbladder disease and will undergo laparoscopic cholecystectomy.  She has also been recently admitted for a pSBO vs ileus that resolved.  Preop CT showed no evidence of obstruction  Pre-operative Diagnosis: biliary dyskinesia  Post-operative Diagnosis: Same  Surgeon: Coralie Keens A   Assistants: 0  Anesthesia: General endotracheal anesthesia  ASA Class: 2  Procedure Details  The patient was seen again in the Holding Room. The risks, benefits, complications, treatment options, and expected outcomes were discussed with the patient. The possibilities of reaction to medication, pulmonary aspiration, perforation of viscus, bleeding, recurrent infection, finding a normal gallbladder, the need for additional procedures, failure to diagnose a condition, the possible need to convert to an open procedure, and creating a complication requiring transfusion or operation were discussed with the patient. The likelihood of improving the patient's symptoms with return to their baseline status is good.  The patient and/or family concurred with the proposed plan, giving informed consent. The site of surgery properly noted. The patient was taken to Operating Room, identified as Tamara Adams and the procedure verified as Laparoscopic Cholecystectomy with Intraoperative Cholangiogram. A Time Out was held and the above information confirmed.  Prior to the induction of general anesthesia, antibiotic prophylaxis was administered. General endotracheal anesthesia was then administered and tolerated well. After the induction, the abdomen was prepped with Chloraprep and draped in sterile fashion. The patient was positioned in the supine position.  Because of her Reason exporter laparotomy for bowel obstruction, I elected to enter the abdomen through a right upper quadrant incision with a 5 mm trocar in Optiview camera. I  traversed all layers of the abdominal wall redrape vision engages in the abdominal cavity. Insufflation of the abdomen was then begun. I evaluated entrant site insole no evidence of bowel injury. I did see one loop small bowel stuck to the lower abdominal wall with a single lesion.  I placed an 11 mm trocar the patient's abdomen just above the umbilicus. I placed another 5 mm in the epigastrium one more In the right upper quadrant all under direct vision.  I then took down the single adhesion with electric scissors.   We positioned the patient in reverse Trendelenburg, tilted slightly to the patient's left.  The gallbladder was identified, the fundus grasped and retracted cephalad. Adhesions were lysed bluntly and with the electrocautery where indicated, taking care not to injure any adjacent organs or viscus. The infundibulum was grasped and retracted laterally, exposing the peritoneum overlying the triangle of Calot. This was then divided and exposed in a blunt fashion. The cystic duct was clearly identified and bluntly dissected circumferentially. A critical view of the cystic duct and cystic artery was obtained.  The cystic duct was then ligated with clips and divided. The cystic artery was, dissected free, ligated with clips and divided as well.   The gallbladder was dissected from the liver bed in retrograde fashion with the electrocautery. The gallbladder was removed and placed in an Endocatch sac. The liver bed was irrigated and inspected. Hemostasis was achieved with the electrocautery. Copious irrigation was utilized and was repeatedly aspirated until clear.  The gallbladder and Endocatch sac were then removed through the umbilical port site.  I closed the fascia at the umbilical port site with a figure-of-eight 0 Vicryl suture.  We again inspected the right upper quadrant for hemostasis.  Pneumoperitoneum was released as we removed the trocars.  4-0 Monocryl was used to close the skin.  Benzoin,  steri-strips, and clean dressings were applied. The patient was then extubated and brought to the recovery room in stable condition. Instrument, sponge, and needle counts were correct at closure and at the conclusion of the case.   Findings: Cholecystitis without Cholelithiasis  Estimated Blood Loss: Minimal         Drains: 0         Specimens: Gallbladder           Complications: None; patient tolerated the procedure well.         Disposition: PACU - hemodynamically stable.         Condition: stable

## 2013-08-18 NOTE — Anesthesia Postprocedure Evaluation (Signed)
Anesthesia Post Note  Patient: Tamara Adams  Procedure(s) Performed: Procedure(s) (LRB): LAPAROSCOPIC CHOLECYSTECTOMY (N/A)  Anesthesia type: General  Patient location: PACU  Post pain: Pain level controlled and Adequate analgesia  Post assessment: Post-op Vital signs reviewed, Patient's Cardiovascular Status Stable, Respiratory Function Stable, Patent Airway and Pain level controlled  Last Vitals:  Filed Vitals:   08/18/13 1012  BP: 152/72  Pulse: 87  Temp:   Resp: 12    Post vital signs: Reviewed and stable  Level of consciousness: awake, alert  and oriented  Complications: No apparent anesthesia complications

## 2013-08-18 NOTE — Interval H&P Note (Signed)
History and Physical Interval Note: again lap chole is planned for biliary dyskinesia.  I again discussed the risks in detail.  She agrees to proceed.  08/18/2013 8:33 AM  Tamara Adams  has presented today for surgery, with the diagnosis of biliary dyskinesia   The various methods of treatment have been discussed with the patient and family. After consideration of risks, benefits and other options for treatment, the patient has consented to  Procedure(s): LAPAROSCOPIC CHOLECYSTECTOMY (N/A) as a surgical intervention .  The patient's history has been reviewed, patient examined, no change in status, stable for surgery.  I have reviewed the patient's chart and labs.  Questions were answered to the patient's satisfaction.     Tamara Adams A

## 2013-08-18 NOTE — Anesthesia Procedure Notes (Signed)
Procedure Name: Intubation Date/Time: 08/18/2013 9:03 AM Performed by: Blair Heys E Pre-anesthesia Checklist: Patient identified, Emergency Drugs available, Suction available and Patient being monitored Patient Re-evaluated:Patient Re-evaluated prior to inductionOxygen Delivery Method: Circle system utilized Preoxygenation: Pre-oxygenation with 100% oxygen Intubation Type: IV induction and Rapid sequence Laryngoscope Size: Miller and 2 Grade View: Grade I Tube type: Oral Tube size: 7.5 mm Number of attempts: 1 Airway Equipment and Method: Stylet Placement Confirmation: ETT inserted through vocal cords under direct vision,  positive ETCO2 and breath sounds checked- equal and bilateral Secured at: 20 cm Tube secured with: Tape Dental Injury: Teeth and Oropharynx as per pre-operative assessment

## 2013-08-19 MED ORDER — OXYCODONE HCL 10 MG PO TABS: 10.0000 mg | ORAL_TABLET | ORAL | Status: DC | PRN

## 2013-08-19 NOTE — Progress Notes (Signed)
Patient ID: Tamara Adams, female   DOB: Oct 03, 1940, 73 y.o.   MRN: [id number]  Doing well today Will discharge home

## 2013-08-19 NOTE — Discharge Summary (Signed)
Physician Discharge Summary  Patient ID: Tamara Adams MRN: [id number] DOB/AGE: 73-Apr-1942 73 y.o.  Admit date: 08/14/2013 Discharge date: 08/19/2013  Admission Diagnoses:  Discharge Diagnoses:  Active Problems:   SBO (small bowel obstruction) biliary dyskinesia  Discharged Condition: good  Hospital Course: admitted for partial SBO.  Was already scheduled to have lap chole for biliary dyskinesia.  NG placed.  Improved quickly.  CT showed no further obstruction.  Went on to have a lap chole and one band lysis of adhesions.  Did very well.  Discharged pod#1 tolerating po.  Consults: None  Significant Diagnostic Studies:   Treatments: surgery: lap chole  Discharge Exam: Blood pressure 130/62, pulse 86, temperature 98 F (36.7 C), temperature source Oral, resp. rate 20, height 5\' 4"  (1.626 m), weight 85 lb 14.4 oz (38.964 kg), SpO2 95.00%. General appearance: alert, cooperative and no distress Resp: clear to auscultation bilaterally Cardio: regular rate and rhythm, S1, S2 normal, no murmur, click, rub or gallop Incision/Wound: abdomen soft, dressings dry  Disposition: Final discharge disposition not confirmed   Future Appointments Provider Department Dept Phone   08/28/2013 9:10 AM Harl Bowie, MD [facility], [place] [phone]       Medication List         alendronate 70 MG tablet  Commonly known as:  FOSAMAX  Take 1 tablet (70 mg total) by mouth once a week. Take with a full glass of water on an empty stomach.     amLODipine-benazepril 5-40 MG per capsule  Commonly known as:  LOTREL  Take 1 capsule by mouth daily.     beclomethasone 80 MCG/ACT inhaler  Commonly known as:  QVAR  Inhale 2 puffs into the lungs 2 (two) times daily.     LORazepam 1 MG tablet  Commonly known as:  ATIVAN  Take 1 tablet (1 mg total) by mouth daily as needed for anxiety or sleep.     omeprazole 40 MG capsule  Commonly known as:  PRILOSEC  Take 1 capsule (40 mg total) by  mouth daily. For GI ulcer     ondansetron 4 MG disintegrating tablet  Commonly known as:  ZOFRAN-ODT  Take 1 tablet (4 mg total) by mouth every 8 (eight) hours as needed for nausea.     Oxycodone HCl 10 MG Tabs  Take 1 tablet (10 mg total) by mouth every 4 (four) hours as needed for moderate pain.     pravastatin 40 MG tablet  Commonly known as:  PRAVACHOL  Take 1 tablet (40 mg total) by mouth at bedtime.     tiotropium 18 MCG inhalation capsule  Commonly known as:  SPIRIVA HANDIHALER  Use 2 inhalations from 1 capsule only once daily     albuterol (2.5 MG/3ML) 0.083% nebulizer solution  Commonly known as:  PROVENTIL  Take 2.5 mg by nebulization every 6 (six) hours as needed for wheezing or shortness of breath.     VENTOLIN HFA 108 (90 BASE) MCG/ACT inhaler  Generic drug:  albuterol  Inhale 2 puffs into the lungs every 6 (six) hours as needed. 2-4 puffs inhaled every 4-6 hours           Follow-up Information   Call Harl Bowie, MD. (appointment already made)    Specialty:  General Surgery   Contact information:   [address] [place] [address] [phone]       Signed: Harl Bowie 08/19/2013, 7:29 AM

## 2013-08-19 NOTE — Discharge Instructions (Signed)
[facility] ______[facility] °LAPAROSCOPIC SURGERY: POST OP INSTRUCTIONS °Always review your discharge instruction sheet given to you by the facility where your surgery was performed. °IF YOU HAVE DISABILITY OR FAMILY LEAVE FORMS, YOU MUST BRING THEM TO THE OFFICE FOR PROCESSING.   °DO NOT GIVE THEM TO YOUR DOCTOR. ° °1. A prescription for pain medication may be given to you upon discharge.  Take your pain medication as prescribed, if needed.  If narcotic pain medicine is not needed, then you may take acetaminophen (Tylenol) or ibuprofen (Advil) as needed. °2. Take your usually prescribed medications unless otherwise directed. °3. If you need a refill on your pain medication, please contact your pharmacy.  They will contact our office to request authorization. Prescriptions will not be filled after 5pm or on week-ends. °4. You should follow a light diet the first few days after arrival home, such as soup and crackers, etc.  Be sure to include lots of fluids daily. °5. Most patients will experience some swelling and bruising in the area of the incisions.  Ice packs will help.  Swelling and bruising can take several days to resolve.  °6. It is common to experience some constipation if taking pain medication after surgery.  Increasing fluid intake and taking a stool softener (such as Colace) will usually help or prevent this problem from occurring.  A mild laxative (Milk of Magnesia or Miralax) should be taken according to package instructions if there are no bowel movements after 48 hours. °7. Unless discharge instructions indicate otherwise, you may remove your bandages 24-48 hours after surgery, and you may shower at that time.  You may have steri-strips (small skin tapes) in place directly over the incision.  These strips should be left on the skin for 7-10 days.  If your surgeon used skin glue on the incision, you may shower in 24 hours.  The glue will flake off over the next 2-3 weeks.  Any sutures or  staples will be removed at the office during your follow-up visit. °8. ACTIVITIES:  You may resume regular (light) daily activities beginning the next day--such as daily self-care, walking, climbing stairs--gradually increasing activities as tolerated.  You may have sexual intercourse when it is comfortable.  Refrain from any heavy lifting or straining until approved by your doctor. °a. You may drive when you are no longer taking prescription pain medication, you can comfortably wear a seatbelt, and you can safely maneuver your car and apply brakes. °b. RETURN TO WORK:  __________________________________________________________ °9. You should see your doctor in the office for a follow-up appointment approximately 2-3 weeks after your surgery.  Make sure that you call for this appointment within a day or two after you arrive home to insure a convenient appointment time. °10. OTHER INSTRUCTIONS: __________________________________________________________________________________________________________________________ __________________________________________________________________________________________________________________________ °WHEN TO CALL YOUR DOCTOR: °1. Fever over 101.0 °2. Inability to urinate °3. Continued bleeding from incision. °4. Increased pain, redness, or drainage from the incision. °5. Increasing abdominal pain ° °The clinic staff is available to answer your questions during regular business hours.  Please don’t hesitate to call and ask to speak to one of the nurses for clinical concerns.  If you have a medical emergency, go to the nearest emergency room or call 911.  A surgeon from [facility] is always on call at the hospital. °[address], [address], [place], [place]  [address] ? [address] [address], [place], [place]   [address] °[phone] ? [phone] ? FAX [phone] °Web site:   [email or web address] °

## 2013-08-20 ENCOUNTER — Encounter (HOSPITAL_COMMUNITY): Payer: Self-pay | Admitting: Surgery

## 2013-08-20 ENCOUNTER — Telehealth (INDEPENDENT_AMBULATORY_CARE_PROVIDER_SITE_OTHER): Payer: Self-pay | Admitting: General Surgery

## 2013-08-20 NOTE — Telephone Encounter (Signed)
Pt called with question about ongoing nausea following GB surgery.  She has not had a BM since surgery.  Lengthy discussion on how to treat constipation, including increasing po fluids, taking stool softener and fiber supplement and walking.  She is passing a little gas and has stopped her pain meds.  Suggested sipping gingerale and nibbling on crackers to promote peristalsis.  She understands all.

## 2013-08-28 ENCOUNTER — Ambulatory Visit (INDEPENDENT_AMBULATORY_CARE_PROVIDER_SITE_OTHER): Payer: Medicare Other | Admitting: Surgery

## 2013-08-28 ENCOUNTER — Encounter (INDEPENDENT_AMBULATORY_CARE_PROVIDER_SITE_OTHER): Payer: Self-pay | Admitting: Surgery

## 2013-08-28 VITALS — BP 148/70 | HR 76 | Temp 98.2°F | Resp 15 | Ht 64.0 in | Wt 87.0 lb

## 2013-08-28 DIAGNOSIS — Z09 Encounter for follow-up examination after completed treatment for conditions other than malignant neoplasm: Secondary | ICD-10-CM

## 2013-08-28 MED ORDER — ONDANSETRON 4 MG PO TBDP: 4.0000 mg | ORAL_TABLET | Freq: Three times a day (TID) | ORAL | Status: DC | PRN

## 2013-08-28 NOTE — Progress Notes (Signed)
Subjective:     Patient ID: Tamara Adams, female   DOB: [birth year], 73 y.o.   MRN: [id number]  HPI She is here for first postop visit status post laparoscopic cholecystectomy. She also had lysis of adhesion from a bowel obstruction. She is doing well  Review of Systems     Objective:   Physical Exam On exam, she looks great. Her abdomen is soft and her incisions are well-healed. The final pathology showed chronic cholecystitis    Assessment:     Patient stable postop     Plan:     She may resume her normal activity. I will see her back as needed

## 2013-09-28 ENCOUNTER — Telehealth: Payer: Self-pay | Admitting: *Deleted

## 2013-09-28 NOTE — Telephone Encounter (Signed)
Pt left message on triage line asking for a refill on her lorazepam.  Are you ok to fill? Please advise

## 2013-09-28 NOTE — Telephone Encounter (Signed)
Ok to fill 

## 2013-09-29 ENCOUNTER — Other Ambulatory Visit: Payer: Self-pay | Admitting: *Deleted

## 2013-09-29 MED ORDER — LORAZEPAM 1 MG PO TABS: 1.0000 mg | ORAL_TABLET | Freq: Every day | ORAL | Status: DC | PRN

## 2013-09-29 NOTE — Telephone Encounter (Signed)
rx printed & pt notified.

## 2013-10-06 ENCOUNTER — Other Ambulatory Visit: Payer: Self-pay | Admitting: Family Medicine

## 2013-10-16 ENCOUNTER — Ambulatory Visit (INDEPENDENT_AMBULATORY_CARE_PROVIDER_SITE_OTHER): Payer: Medicare Other | Admitting: Family Medicine

## 2013-10-16 ENCOUNTER — Encounter: Payer: Self-pay | Admitting: Family Medicine

## 2013-10-16 ENCOUNTER — Ambulatory Visit (INDEPENDENT_AMBULATORY_CARE_PROVIDER_SITE_OTHER): Payer: Medicare Other

## 2013-10-16 VITALS — BP 150/61 | HR 104 | Temp 98.2°F | Wt 89.0 lb

## 2013-10-16 DIAGNOSIS — R109 Unspecified abdominal pain: Secondary | ICD-10-CM

## 2013-10-16 DIAGNOSIS — I499 Cardiac arrhythmia, unspecified: Secondary | ICD-10-CM | POA: Diagnosis not present

## 2013-10-16 DIAGNOSIS — R1013 Epigastric pain: Secondary | ICD-10-CM | POA: Diagnosis not present

## 2013-10-16 DIAGNOSIS — R10813 Right lower quadrant abdominal tenderness: Secondary | ICD-10-CM

## 2013-10-16 DIAGNOSIS — K5900 Constipation, unspecified: Secondary | ICD-10-CM

## 2013-10-16 DIAGNOSIS — R197 Diarrhea, unspecified: Secondary | ICD-10-CM

## 2013-10-16 DIAGNOSIS — R110 Nausea: Secondary | ICD-10-CM

## 2013-10-16 DIAGNOSIS — J441 Chronic obstructive pulmonary disease with (acute) exacerbation: Secondary | ICD-10-CM | POA: Diagnosis not present

## 2013-10-16 MED ORDER — DOXYCYCLINE HYCLATE 100 MG PO TABS: 100.0000 mg | ORAL_TABLET | Freq: Two times a day (BID) | ORAL | Status: DC

## 2013-10-16 MED ORDER — PREDNISONE 20 MG PO TABS: 40.0000 mg | ORAL_TABLET | Freq: Every day | ORAL | Status: DC

## 2013-10-16 MED ORDER — ONDANSETRON 4 MG PO TBDP
4.0000 mg | ORAL_TABLET | Freq: Three times a day (TID) | ORAL | Status: DC | PRN
Start: 2013-10-16 — End: 2014-03-12

## 2013-10-16 MED ORDER — DOXYCYCLINE HYCLATE 100 MG PO TABS
100.0000 mg | ORAL_TABLET | Freq: Two times a day (BID) | ORAL | Status: DC
Start: 2013-10-16 — End: 2013-10-16

## 2013-10-16 NOTE — Progress Notes (Signed)
Subjective:    Patient ID: Tamara Adams, female    DOB: Mar 12, 1941, 73 y.o.   MRN: [id number]  HPI Abd pain x 1 week, nausea with low grade fevers.  Highest temp was 102.  Has had a cough as well.  Hx of small bowel obstruction and smoker.   Cholecystectomy in Jan.  They found a lot of scar tissue at that time and thought that might be causing problems  She feels like this pain is similar to what she had when had SBO.  Feels concentrated in epigastric area but feels like the whole abodomen. Radiating into her back.  Can get intense at time up to 10/10 and will get Heart racing and SOB when it happens. Today pain is 5/10.  Zofran helps some.  Started last Thursday or Friday.  Feels like can't eat much.  Constantly feels full and feel like her abdomen is tight.  Not sure if eating makes it worse.  Has mostly been eating soup and toast.  Has had change in bowels since GB removed. Occ diarrhea and having 2-3 BM per days.  Tried some TUMS, backing soda.  Feels like heartbeat is irregular when this happens.   COPD- has had increase and productive yellow cough.  Had URI sxs as well and feels it is aggrevating her COPD.  Mild ST initially but better.   Has been using NEBs TID. Exposed to granbaby with URI   Review of Systems  BP 150/61  Pulse 104  Temp(Src) 98.2 F (36.8 C)  Wt 89 lb (40.37 kg)  SpO2 92%    Allergies  Allergen Reactions  . Maxidex [Dexamethasone] Other (See Comments)    dehydration  . Tylox [Oxycodone-Acetaminophen] Itching    Past Medical History  Diagnosis Date  . COPD (chronic obstructive pulmonary disease)     FVC 72%, FEV1 29%, FEV1 ratio 32% (very severe (COPD)  . Hypertension   . Pneumonia   . DVT (deep venous thrombosis)     "years ago after a surgery"  . Pinched nerve     in back  . Peptic ulcer   . Colon polyp     removed, benign  . H/O hiatal hernia   . Degenerative disc disease   . Anemia     as a child  . Kidney stone     Past Surgical History   Procedure Laterality Date  . Abdominal hysterectomy  1987    and one ovary  . Oophorectomy  1992  . Cataract extraction w/phaco  06/12/2012    Dr. Boykin Reaper, left eye  . Cataract extraction  06/22/2012    Right eye, Dr. Lester Kinsman.   Marland Kitchen Urethra surgery    . Small intestine surgery      for a blockage, no bowel was removed, just kinked from scar tissue  . Cholecystectomy N/A 08/18/2013    Procedure: LAPAROSCOPIC CHOLECYSTECTOMY;  Surgeon: Harl Bowie, MD;  Location: [facility];  Service: General;  Laterality: N/A;    History   Social History  . Marital Status: Married    Spouse Name: N/A    Number of Children: 3  . Years of Education: N/A   Occupational History  . retired.      Social History Main Topics  . Smoking status: Current Every Day Smoker -- 0.25 packs/day    Types: Cigarettes  . Smokeless tobacco: Never Used     Comment: Using Electronic cigarettes  . Alcohol Use: No  . Drug Use: No  .  Sexual Activity: Not on file   Other Topics Concern  . Not on file   Social History Narrative   No regular exercise.     Family History  Problem Relation Age of Onset  . Hypertension Mother   . Ovarian cancer Mother     Outpatient Encounter Prescriptions as of 10/16/2013  Medication Sig  . albuterol (PROVENTIL) (2.5 MG/3ML) 0.083% nebulizer solution Take 2.5 mg by nebulization every 6 (six) hours as needed for wheezing or shortness of breath.  Marland Kitchen albuterol (VENTOLIN HFA) 108 (90 BASE) MCG/ACT inhaler Inhale 2 puffs into the lungs every 6 (six) hours as needed. 2-4 puffs inhaled every 4-6 hours   . alendronate (FOSAMAX) 70 MG tablet Take 1 tablet (70 mg total) by mouth once a week. Take with a full glass of water on an empty stomach.  Marland Kitchen amLODipine-benazepril (LOTREL) 5-40 MG per capsule Take 1 capsule by mouth daily.  . beclomethasone (QVAR) 80 MCG/ACT inhaler Inhale 2 puffs into the lungs 2 (two) times daily.  Marland Kitchen LORazepam (ATIVAN) 1 MG tablet Take 1 tablet (1 mg total)  by mouth daily as needed for anxiety or sleep.  Marland Kitchen omeprazole (PRILOSEC) 40 MG capsule Take 1 capsule (40 mg total) by mouth daily. For GI ulcer  . ondansetron (ZOFRAN-ODT) 4 MG disintegrating tablet Take 1 tablet (4 mg total) by mouth every 8 (eight) hours as needed for nausea.  . pravastatin (PRAVACHOL) 40 MG tablet Take 1 tablet (40 mg total) by mouth at bedtime.  Marland Kitchen tiotropium (SPIRIVA HANDIHALER) 18 MCG inhalation capsule Use 2 inhalations from 1 capsule only once daily  . [DISCONTINUED] ondansetron (ZOFRAN-ODT) 4 MG disintegrating tablet Take 1 tablet (4 mg total) by mouth every 8 (eight) hours as needed for nausea.  Marland Kitchen doxycycline (VIBRA-TABS) 100 MG tablet Take 1 tablet (100 mg total) by mouth 2 (two) times daily.  . predniSONE (DELTASONE) 20 MG tablet Take 2 tablets (40 mg total) by mouth daily.  . [DISCONTINUED] doxycycline (VIBRA-TABS) 100 MG tablet Take 1 tablet (100 mg total) by mouth 2 (two) times daily.  . [DISCONTINUED] predniSONE (DELTASONE) 20 MG tablet Take 2 tablets (40 mg total) by mouth daily.          Objective:   Physical Exam  Constitutional: She is oriented to person, place, and time. She appears well-developed and well-nourished.  HENT:  Head: Normocephalic and atraumatic.  Right Ear: External ear normal.  Left Ear: External ear normal.  Nose: Nose normal.  Mouth/Throat: Oropharynx is clear and moist.  TMs and canals are clear.   Eyes: Conjunctivae and EOM are normal. Pupils are equal, round, and reactive to light.  Neck: Neck supple. No thyromegaly present.  Cardiovascular: Normal rate, regular rhythm and normal heart sounds.   Pulmonary/Chest: Effort normal and breath sounds normal. She has no wheezes.  Abdominal: Soft. Bowel sounds are normal. She exhibits no distension and no mass. There is tenderness. There is no rebound and no guarding.  Very tender in the RLQ pain. Thickned muscle on the left side of abdominal wall compared to right. This is a stable  finding.   Lymphadenopathy:    She has no cervical adenopathy.  Neurological: She is alert and oriented to person, place, and time.  Skin: Skin is warm and dry.  Psychiatric: She has a normal mood and affect.          Assessment & Plan:  ABd Pain - suspicious for possible small bowel obstruction. Will get plain abdominal film, 2  view today. We'll also check a CBC and CMP to look for infection or electrolyte disturbance. Clear liquid diet, H.O provided.  Refilled her zofran.   URI/COPD exacerbation- will treat with doxycycline and prednisone. Call if not improving in the next 48-72 hours. Continue use nebulizer treatments 3 times a day as needed.

## 2013-10-16 NOTE — Progress Notes (Signed)
Spoke w/Tamara Adams informed him what Rx's that needed to be dc'ed he has to contact a pharmacy tech to make this change. He spoke with Tamara Adams and they will d/c.Tamara Adams, Tamara Adams

## 2013-10-16 NOTE — Patient Instructions (Signed)
Diet The clear liquid diet consists of foods that are liquid or will become liquid at room temperature. Examples of foods allowed on a clear liquid diet include fruit juice, broth or bouillon, gelatin, or frozen ice pops. You should be able to see through the liquid. The purpose of this diet is to provide the necessary fluids, electrolytes (such as sodium and potassium), and energy to keep the body functioning during times when you are not able to consume a regular diet. A clear liquid diet should not be continued for long periods of time, as it is not nutritionally adequate.  A CLEAR LIQUID DIET MAY BE NEEDED:  When a sudden-onset (acute) condition occurs before or after surgery.   As the first step in oral feeding.   For fluid and electrolyte replacement in diarrheal diseases.   As a diet before certain medical tests are performed.  ADEQUACY The clear liquid diet is adequate only in ascorbic acid, according to the Recommended Dietary Allowances of the [employer].  CHOOSING FOODS Breads and Starches  Allowed: None are allowed.   Avoid: All are to be avoided.  Vegetables  Allowed: Strained vegetable juices.   Avoid: Any others.  Fruit  Allowed: Strained fruit juices and fruit drinks. Include 1 serving of citrus or vitamin C-enriched fruit juice daily.   Avoid: Any others.  Meat and Meat Substitutes  Allowed: None are allowed.   Avoid: All are to be avoided.  Milk Products  Allowed: None are allowed.   Avoid: All are to be avoided.  Soups and Combination Foods  Allowed: Clear bouillon, broth, or strained broth-based soups.   Avoid: Any others.  Desserts and Sweets  Allowed: Sugar, honey. High-protein gelatin. Flavored gelatin, ices, or frozen ice pops that do not contain milk.   Avoid: Any others.  Fats and Oils  Allowed: None are allowed.   Avoid: All are to be avoided.  Beverages  Allowed: Cereal  beverages, coffee (regular or decaffeinated), tea, or soda at the discretion of your health care provider.   Avoid: Any others.  Condiments  Allowed: Salt.   Avoid: Any others, including pepper.  Supplements  Allowed: Liquid nutrition beverages that you can see through.   Avoid: Any others that contain lactose or fiber. SAMPLE MEAL PLAN Breakfast  4 oz (120 mL) strained orange juice.   to 1 cup (120 to 240 mL) gelatin (plain or fortified).  1 cup (240 mL) beverage (coffee or tea).  Sugar, if desired. Midmorning Snack   cup (120 mL) gelatin (plain or fortified). Lunch  1 cup (240 mL) broth or consomm.  4 oz (120 mL) strained grapefruit juice.   cup (120 mL) gelatin (plain or fortified).  1 cup (240 mL) beverage (coffee or tea).  Sugar, if desired. Midafternoon Snack   cup (120 mL) fruit ice.   cup (120 mL) strained fruit juice. Dinner  1 cup (240 mL) broth or consomm.   cup (120 mL) cranberry juice.   cup (120 mL) flavored gelatin (plain or fortified).  1 cup (240 mL) beverage (coffee or tea).  Sugar, if desired. Evening Snack  4 oz (120 mL) strained apple juice (vitamin C-fortified).   cup (120 mL) flavored gelatin (plain or fortified). MAKE SURE YOU:  Understand these instructions.  Will watch your child's condition.  Will get help right away if your child is not doing well or gets worse. Document Released: 07/09/2005 Document Revised: 03/11/2013 Document Reviewed: 12/09/2012 [facility] Patient Information 2014 [place].

## 2013-10-17 LAB — LIPASE: Lipase: 16 U/L (ref 0–75)

## 2013-10-17 LAB — CBC WITH DIFFERENTIAL/PLATELET
Basophils Absolute: 0 10*3/uL (ref 0.0–0.1)
Basophils Relative: 0 % (ref 0–1)
Eosinophils Absolute: 0 10*3/uL (ref 0.0–0.7)
Eosinophils Relative: 0 % (ref 0–5)
HCT: 39.7 % (ref 36.0–46.0)
HEMOGLOBIN: 13.8 g/dL (ref 12.0–15.0)
LYMPHS ABS: 1.4 10*3/uL (ref 0.7–4.0)
LYMPHS PCT: 10 % — AB (ref 12–46)
MCH: 31.2 pg (ref 26.0–34.0)
MCHC: 34.8 g/dL (ref 30.0–36.0)
MCV: 89.8 fL (ref 78.0–100.0)
MONOS PCT: 10 % (ref 3–12)
Monocytes Absolute: 1.4 10*3/uL — ABNORMAL HIGH (ref 0.1–1.0)
NEUTROS ABS: 11 10*3/uL — AB (ref 1.7–7.7)
NEUTROS PCT: 80 % — AB (ref 43–77)
Platelets: 280 10*3/uL (ref 150–400)
RBC: 4.42 MIL/uL (ref 3.87–5.11)
RDW: 13 % (ref 11.5–15.5)
WBC: 13.8 10*3/uL — AB (ref 4.0–10.5)

## 2013-10-17 LAB — COMPLETE METABOLIC PANEL WITH GFR
ALT: 23 U/L (ref 0–35)
AST: 29 U/L (ref 0–37)
Albumin: 4 g/dL (ref 3.5–5.2)
Alkaline Phosphatase: 100 U/L (ref 39–117)
BILIRUBIN TOTAL: 0.5 mg/dL (ref 0.2–1.2)
BUN: 9 mg/dL (ref 6–23)
CO2: 27 meq/L (ref 19–32)
CREATININE: 0.73 mg/dL (ref 0.50–1.10)
Calcium: 9.2 mg/dL (ref 8.4–10.5)
Chloride: 96 mEq/L (ref 96–112)
GFR, EST NON AFRICAN AMERICAN: 83 mL/min
Glucose, Bld: 94 mg/dL (ref 70–99)
Potassium: 3.5 mEq/L (ref 3.5–5.3)
Sodium: 137 mEq/L (ref 135–145)
Total Protein: 6.5 g/dL (ref 6.0–8.3)

## 2013-10-17 LAB — AMYLASE: Amylase: 53 U/L (ref 0–105)

## 2013-10-21 ENCOUNTER — Other Ambulatory Visit: Payer: Self-pay | Admitting: Family Medicine

## 2013-10-21 DIAGNOSIS — R109 Unspecified abdominal pain: Secondary | ICD-10-CM

## 2013-10-22 ENCOUNTER — Other Ambulatory Visit: Payer: Self-pay | Admitting: *Deleted

## 2013-10-22 ENCOUNTER — Telehealth: Payer: Self-pay | Admitting: Family Medicine

## 2013-10-22 MED ORDER — TIOTROPIUM BROMIDE MONOHYDRATE 18 MCG IN CAPS: ORAL_CAPSULE | RESPIRATORY_TRACT | Status: DC

## 2013-10-22 MED ORDER — BECLOMETHASONE DIPROPIONATE 80 MCG/ACT IN AERS: 2.0000 | INHALATION_SPRAY | Freq: Two times a day (BID) | RESPIRATORY_TRACT | Status: DC

## 2013-10-22 MED ORDER — ALBUTEROL SULFATE (2.5 MG/3ML) 0.083% IN NEBU: 2.5000 mg | INHALATION_SOLUTION | Freq: Four times a day (QID) | RESPIRATORY_TRACT | Status: DC | PRN

## 2013-10-22 MED ORDER — ALBUTEROL SULFATE HFA 108 (90 BASE) MCG/ACT IN AERS: 2.0000 | INHALATION_SPRAY | Freq: Four times a day (QID) | RESPIRATORY_TRACT | Status: DC | PRN

## 2013-10-22 NOTE — Telephone Encounter (Signed)
Re: Referral for Mesenteric duplex I was told by tech at [facility] that they do not do full mesenteric study they only take a quick look at the renal artery.  Will this be enough to satisfy?

## 2013-10-22 NOTE — Telephone Encounter (Signed)
Forwarded to Dr. Madilyn Fireman.Tamara Adams

## 2013-10-25 NOTE — Telephone Encounter (Signed)
Let see if they do it at [facility]

## 2013-10-26 ENCOUNTER — Telehealth: Payer: Self-pay | Admitting: *Deleted

## 2013-10-26 NOTE — Telephone Encounter (Signed)
Pt is still having trouble with BM she has been using mirilax, stool softneres and benefiber and these are not working she reports that she had a small bm today however it has been 5 days since her last bm.Tamara Adams [place]

## 2013-10-26 NOTE — Telephone Encounter (Signed)
Increase Miralax to BID until consistant soft BM and then can decrease to once a day again. If still not oving bowels well by Thursday then we can try something else that we would use for a colon prep.

## 2013-10-27 MED ORDER — PEG-KCL-NACL-NASULF-NA ASC-C 100 G PO SOLR: 1.0000 | Freq: Once | ORAL | Status: DC

## 2013-10-27 NOTE — Telephone Encounter (Signed)
OK will send over Moviprep. It is used to move bowels before a colonoscopy

## 2013-10-27 NOTE — Telephone Encounter (Signed)
Pt stated that she has already been using the miralax BID and it is not working. Please advise.Tamara Adams [place]

## 2013-10-28 NOTE — Telephone Encounter (Signed)
Pt wanted to know about the referral for a Mesenteric duplex? She has not heard anything about this.Tamara Adams

## 2013-10-28 NOTE — Telephone Encounter (Signed)
Pt informed.Tamara Adams

## 2013-10-28 NOTE — Telephone Encounter (Signed)
We faxed notes over to [facility] on April 2. Says she has not heard anything from them. We will call them and see what's going on and why she has not been scheduled.

## 2013-10-28 NOTE — Telephone Encounter (Addendum)
Spoke w/Tamara Adams about this and she stated that she had refaxed the referral I told pt of this. Called [facility] to see where they are in the process of the referral. Spoke with Tamara Adams and told her that the order was originally faxed on the 2nd and refaxed this morning. I called and spoke w/Tamara Adams and she informed me that they did receive the fax and that it is being worked on. I then asked how long is the turn around time on this she said she wasn't sure however she would ask Tamara Adams the scheduler expedite this for Tamara Adams. I called the pt back and informed her of what was going on .Tamara Adams

## 2013-11-04 DIAGNOSIS — R109 Unspecified abdominal pain: Secondary | ICD-10-CM | POA: Diagnosis not present

## 2013-11-05 ENCOUNTER — Telehealth: Payer: Self-pay | Admitting: *Deleted

## 2013-11-19 ENCOUNTER — Telehealth: Payer: Self-pay | Admitting: *Deleted

## 2013-11-19 NOTE — Telephone Encounter (Signed)
Pt calling about her results. Sent fax to medical records .Tamara Adams

## 2013-11-20 ENCOUNTER — Other Ambulatory Visit: Payer: Self-pay | Admitting: *Deleted

## 2013-11-20 MED ORDER — LORAZEPAM 1 MG PO TABS: 1.0000 mg | ORAL_TABLET | Freq: Every day | ORAL | Status: DC | PRN

## 2013-11-23 ENCOUNTER — Telehealth: Payer: Self-pay | Admitting: Family Medicine

## 2013-11-23 NOTE — Telephone Encounter (Signed)
Results/recommendations given to patient pt will call when she is ready to be seen by GI.Teddy Spike

## 2013-11-23 NOTE — Telephone Encounter (Signed)
Call pt: finally got her [place] results back from [facility].  Everything looked ok.  No critical stenosis of the vessels that feed the bowels. Thus still not sure what is cuasing her pain. Can see GI again if would like.

## 2013-12-07 ENCOUNTER — Encounter: Payer: Self-pay | Admitting: Family Medicine

## 2013-12-07 ENCOUNTER — Ambulatory Visit (INDEPENDENT_AMBULATORY_CARE_PROVIDER_SITE_OTHER): Payer: Medicare Other | Admitting: Family Medicine

## 2013-12-07 VITALS — BP 160/79 | HR 96 | Wt 92.0 lb

## 2013-12-07 DIAGNOSIS — R5383 Other fatigue: Secondary | ICD-10-CM

## 2013-12-07 DIAGNOSIS — R0902 Hypoxemia: Secondary | ICD-10-CM

## 2013-12-07 DIAGNOSIS — L819 Disorder of pigmentation, unspecified: Secondary | ICD-10-CM | POA: Diagnosis not present

## 2013-12-07 DIAGNOSIS — J449 Chronic obstructive pulmonary disease, unspecified: Secondary | ICD-10-CM | POA: Diagnosis not present

## 2013-12-07 DIAGNOSIS — R5381 Other malaise: Secondary | ICD-10-CM | POA: Diagnosis not present

## 2013-12-07 DIAGNOSIS — L814 Other melanin hyperpigmentation: Secondary | ICD-10-CM

## 2013-12-07 DIAGNOSIS — J9611 Chronic respiratory failure with hypoxia: Secondary | ICD-10-CM | POA: Insufficient documentation

## 2013-12-07 LAB — CBC WITH DIFFERENTIAL/PLATELET
BASOS PCT: 1 % (ref 0–1)
Basophils Absolute: 0.1 10*3/uL (ref 0.0–0.1)
EOS ABS: 0.1 10*3/uL (ref 0.0–0.7)
Eosinophils Relative: 2 % (ref 0–5)
HEMATOCRIT: 39.4 % (ref 36.0–46.0)
Hemoglobin: 13.5 g/dL (ref 12.0–15.0)
Lymphocytes Relative: 32 % (ref 12–46)
Lymphs Abs: 1.8 10*3/uL (ref 0.7–4.0)
MCH: 30.8 pg (ref 26.0–34.0)
MCHC: 34.3 g/dL (ref 30.0–36.0)
MCV: 89.7 fL (ref 78.0–100.0)
MONO ABS: 0.5 10*3/uL (ref 0.1–1.0)
MONOS PCT: 9 % (ref 3–12)
Neutro Abs: 3.1 10*3/uL (ref 1.7–7.7)
Neutrophils Relative %: 56 % (ref 43–77)
Platelets: 335 10*3/uL (ref 150–400)
RBC: 4.39 MIL/uL (ref 3.87–5.11)
RDW: 13.7 % (ref 11.5–15.5)
WBC: 5.6 10*3/uL (ref 4.0–10.5)

## 2013-12-07 NOTE — Progress Notes (Signed)
Patients O2 without oxygen @ rest: 94% Patients O2 without oxygen during exercise: 88% Patients O2 with oxygen during exercise:98% .Teddy Spike

## 2013-12-07 NOTE — Progress Notes (Signed)
Subjective:    Patient ID: Tamara Adams, female    DOB: Oct 28, 1940, 73 y.o.   MRN: [id number]  HPI Here for follow up for oxygen for her COPD. Wears her oxygen every night. She's very consistent with it. .  If doesn't use she is extremely tired during the daytime. She also complains of some shortness of breath during the daytime. No change in cough or sputum. Has to sit and rest to catch her breath with acitivy.      She feels really tired and fatigue. No worsening or alleviating symptoms. She just feels weak at times. Not lseeping as well since baby in the house. Uses lorazepam about 3-4 nights per week.  No CP, or SOB, or palpitation. No swelling.  She has been trying to get more calories in with Ensure.  She has been trying to take a B12 supplement to help with energy level but hasn't noticed any improvement in her symptoms. She still has some intermittent nausea GI upset but overall her stomach issues have been better since she had lysis of the adhesions.  Has to spot on her right facial check she noticed it in the mirror this morning. She thinks it may have come larger but she's not sure. No pain or tenderness. She wanted to make sure that it looked okay.   Review of Systems BP 160/79  Pulse 96  Wt 92 lb (41.731 kg)  SpO2 94%    Allergies  Allergen Reactions  . Maxidex [Dexamethasone] Other (See Comments)    dehydration  . Tylox [Oxycodone-Acetaminophen] Itching    Past Medical History  Diagnosis Date  . COPD (chronic obstructive pulmonary disease)     FVC 72%, FEV1 29%, FEV1 ratio 32% (very severe (COPD)  . Hypertension   . Pneumonia   . DVT (deep venous thrombosis)     "years ago after a surgery"  . Pinched nerve     in back  . Peptic ulcer   . Colon polyp     removed, benign  . H/O hiatal hernia   . Degenerative disc disease   . Anemia     as a child  . Kidney stone     Past Surgical History  Procedure Laterality Date  . Abdominal hysterectomy  1987    and  one ovary  . Oophorectomy  1992  . Cataract extraction w/phaco  06/12/2012    Dr. Boykin Reaper, left eye  . Cataract extraction  06/22/2012    Right eye, Dr. Lester Kinsman.   Marland Kitchen Urethra surgery    . Small intestine surgery      for a blockage, no bowel was removed, just kinked from scar tissue  . Cholecystectomy N/A 08/18/2013    Procedure: LAPAROSCOPIC CHOLECYSTECTOMY;  Surgeon: Harl Bowie, MD;  Location: [facility];  Service: General;  Laterality: N/A;    History   Social History  . Marital Status: Married    Spouse Name: N/A    Number of Children: 3  . Years of Education: N/A   Occupational History  . retired.      Social History Main Topics  . Smoking status: Current Every Day Smoker -- 0.25 packs/day    Types: Cigarettes  . Smokeless tobacco: Never Used     Comment: Using Electronic cigarettes  . Alcohol Use: No  . Drug Use: No  . Sexual Activity: Not on file   Other Topics Concern  . Not on file   Social History Narrative   No  regular exercise.     Family History  Problem Relation Age of Onset  . Hypertension Mother   . Ovarian cancer Mother     Outpatient Encounter Prescriptions as of 12/07/2013  Medication Sig  . albuterol (PROVENTIL) (2.5 MG/3ML) 0.083% nebulizer solution Take 3 mLs (2.5 mg total) by nebulization every 6 (six) hours as needed for wheezing or shortness of breath.  Marland Kitchen albuterol (VENTOLIN HFA) 108 (90 BASE) MCG/ACT inhaler Inhale 2 puffs into the lungs every 6 (six) hours as needed. 2-4 puffs inhaled every 4-6 hours  . alendronate (FOSAMAX) 70 MG tablet Take 1 tablet (70 mg total) by mouth once a week. Take with a full glass of water on an empty stomach.  Marland Kitchen amLODipine-benazepril (LOTREL) 5-40 MG per capsule Take 1 capsule by mouth daily.  . beclomethasone (QVAR) 80 MCG/ACT inhaler Inhale 2 puffs into the lungs 2 (two) times daily.  Marland Kitchen LORazepam (ATIVAN) 1 MG tablet Take 1 tablet (1 mg total) by mouth daily as needed for anxiety or sleep.  Marland Kitchen  omeprazole (PRILOSEC) 40 MG capsule Take 1 capsule (40 mg total) by mouth daily. For GI ulcer  . ondansetron (ZOFRAN-ODT) 4 MG disintegrating tablet Take 1 tablet (4 mg total) by mouth every 8 (eight) hours as needed for nausea.  . peg 3350 powder (MOVIPREP) 100 G SOLR Take 1 kit (200 g total) by mouth once.  . pravastatin (PRAVACHOL) 40 MG tablet Take 1 tablet (40 mg total) by mouth at bedtime.  Marland Kitchen tiotropium (SPIRIVA HANDIHALER) 18 MCG inhalation capsule Use 2 inhalations from 1 capsule only once daily  . [DISCONTINUED] doxycycline (VIBRA-TABS) 100 MG tablet Take 1 tablet (100 mg total) by mouth 2 (two) times daily.  . [DISCONTINUED] predniSONE (DELTASONE) 20 MG tablet Take 2 tablets (40 mg total) by mouth daily.          Objective:   Physical Exam  Constitutional: She is oriented to person, place, and time. She appears well-developed and well-nourished.  HENT:  Head: Normocephalic and atraumatic.  Neck: Neck supple.  Cardiovascular: Normal rate, regular rhythm and normal heart sounds.   Pulmonary/Chest: Effort normal and breath sounds normal.  Neurological: She is alert and oriented to person, place, and time.  Skin: Skin is warm and dry.  Just 2 small hyperpigmented macular lesions on the right facial cheek most consistent with a solar lentigo versus early seborrheic keratosis.  Psychiatric: She has a normal mood and affect. Her behavior is normal.          Assessment & Plan:  COPD-she does qualify for daytime oxygen since she drops to 88% with ambulation. She responds to oxygen therapy. Recommend she use her oxgen during the daytime when she is doing strenuous activity like cleaning et.   Solar lentigo - gave her reassurance about the 2 lesions on her right facial cheek. The most consistent with a solar length ago versus an early small seborrheic keratosis. If they continue to get larger then please let me know and consider cryotherapy for treatment.  Fatigue-could be  secondary to her COPD. She has actually been really working hard on increasing her calories. In fact she's actually gained 3 pounds since I last saw her which is fantastic. I recommend screening for anemia as we have not checked a Y24, folic acid, or ferritin and quite some time. Her digestive problems she is at very high risk for malabsorption.

## 2013-12-08 LAB — URINALYSIS
BILIRUBIN URINE: NEGATIVE
GLUCOSE, UA: NEGATIVE mg/dL
KETONES UR: NEGATIVE mg/dL
Leukocytes, UA: NEGATIVE
Nitrite: NEGATIVE
PROTEIN: NEGATIVE mg/dL
Specific Gravity, Urine: 1.006 (ref 1.005–1.030)
UROBILINOGEN UA: 0.2 mg/dL (ref 0.0–1.0)
pH: 7.5 (ref 5.0–8.0)

## 2013-12-08 LAB — FOLATE: FOLATE: 17.7 ng/mL

## 2013-12-08 LAB — VITAMIN B12: Vitamin B-12: 596 pg/mL (ref 211–911)

## 2013-12-08 LAB — FERRITIN: FERRITIN: 18 ng/mL (ref 10–291)

## 2013-12-16 ENCOUNTER — Encounter: Payer: Self-pay | Admitting: Family Medicine

## 2013-12-17 ENCOUNTER — Telehealth: Payer: Self-pay | Admitting: *Deleted

## 2013-12-17 NOTE — Telephone Encounter (Signed)
Received a vm from [name] with [name] stating that they needed pt's progress notes from her face to face visit on 5.18.2015. When I returned the call  [phone]) I left a vm informing [name] that these notes were faxed on the following day and that I would resend.Tamara Adams

## 2013-12-22 ENCOUNTER — Encounter: Payer: Self-pay | Admitting: Cardiology

## 2013-12-26 ENCOUNTER — Other Ambulatory Visit: Payer: Self-pay | Admitting: Family Medicine

## 2014-01-21 ENCOUNTER — Other Ambulatory Visit: Payer: Self-pay | Admitting: *Deleted

## 2014-01-21 MED ORDER — LORAZEPAM 1 MG PO TABS: 1.0000 mg | ORAL_TABLET | Freq: Every day | ORAL | Status: DC | PRN

## 2014-01-27 ENCOUNTER — Emergency Department (HOSPITAL_BASED_OUTPATIENT_CLINIC_OR_DEPARTMENT_OTHER): Payer: Medicare Other

## 2014-01-27 ENCOUNTER — Inpatient Hospital Stay (HOSPITAL_BASED_OUTPATIENT_CLINIC_OR_DEPARTMENT_OTHER)
Admission: EM | Admit: 2014-01-27 | Discharge: 2014-01-31 | DRG: 388 | Disposition: A | Discharge status: Home / Self Care | Payer: Medicare Other | Attending: Surgery | Admitting: Surgery

## 2014-01-27 ENCOUNTER — Encounter (HOSPITAL_BASED_OUTPATIENT_CLINIC_OR_DEPARTMENT_OTHER): Payer: Self-pay | Admitting: Emergency Medicine

## 2014-01-27 DIAGNOSIS — Z888 Allergy status to other drugs, medicaments and biological substances status: Secondary | ICD-10-CM

## 2014-01-27 DIAGNOSIS — Z8711 Personal history of peptic ulcer disease: Secondary | ICD-10-CM

## 2014-01-27 DIAGNOSIS — Z885 Allergy status to narcotic agent status: Secondary | ICD-10-CM | POA: Diagnosis not present

## 2014-01-27 DIAGNOSIS — R112 Nausea with vomiting, unspecified: Secondary | ICD-10-CM | POA: Diagnosis not present

## 2014-01-27 DIAGNOSIS — K5669 Other intestinal obstruction: Secondary | ICD-10-CM | POA: Diagnosis not present

## 2014-01-27 DIAGNOSIS — Z7901 Long term (current) use of anticoagulants: Secondary | ICD-10-CM | POA: Diagnosis not present

## 2014-01-27 DIAGNOSIS — J449 Chronic obstructive pulmonary disease, unspecified: Secondary | ICD-10-CM | POA: Diagnosis not present

## 2014-01-27 DIAGNOSIS — Z9981 Dependence on supplemental oxygen: Secondary | ICD-10-CM

## 2014-01-27 DIAGNOSIS — K5730 Diverticulosis of large intestine without perforation or abscess without bleeding: Secondary | ICD-10-CM | POA: Diagnosis not present

## 2014-01-27 DIAGNOSIS — K56609 Unspecified intestinal obstruction, unspecified as to partial versus complete obstruction: Secondary | ICD-10-CM | POA: Diagnosis not present

## 2014-01-27 DIAGNOSIS — F17200 Nicotine dependence, unspecified, uncomplicated: Secondary | ICD-10-CM | POA: Diagnosis present

## 2014-01-27 DIAGNOSIS — E43 Unspecified severe protein-calorie malnutrition: Secondary | ICD-10-CM | POA: Insufficient documentation

## 2014-01-27 DIAGNOSIS — K449 Diaphragmatic hernia without obstruction or gangrene: Secondary | ICD-10-CM | POA: Diagnosis present

## 2014-01-27 DIAGNOSIS — Z8249 Family history of ischemic heart disease and other diseases of the circulatory system: Secondary | ICD-10-CM

## 2014-01-27 DIAGNOSIS — Z8041 Family history of malignant neoplasm of ovary: Secondary | ICD-10-CM

## 2014-01-27 DIAGNOSIS — Z9849 Cataract extraction status, unspecified eye: Secondary | ICD-10-CM

## 2014-01-27 DIAGNOSIS — Z860100 Personal history of colon polyps, unspecified: Secondary | ICD-10-CM

## 2014-01-27 DIAGNOSIS — Z87442 Personal history of urinary calculi: Secondary | ICD-10-CM

## 2014-01-27 DIAGNOSIS — Z9089 Acquired absence of other organs: Secondary | ICD-10-CM | POA: Diagnosis not present

## 2014-01-27 DIAGNOSIS — R1031 Right lower quadrant pain: Secondary | ICD-10-CM | POA: Diagnosis not present

## 2014-01-27 DIAGNOSIS — Z681 Body mass index (BMI) 19 or less, adult: Secondary | ICD-10-CM

## 2014-01-27 DIAGNOSIS — Z79899 Other long term (current) drug therapy: Secondary | ICD-10-CM | POA: Diagnosis not present

## 2014-01-27 DIAGNOSIS — K7689 Other specified diseases of liver: Secondary | ICD-10-CM | POA: Diagnosis present

## 2014-01-27 DIAGNOSIS — I10 Essential (primary) hypertension: Secondary | ICD-10-CM | POA: Diagnosis not present

## 2014-01-27 DIAGNOSIS — Z86718 Personal history of other venous thrombosis and embolism: Secondary | ICD-10-CM

## 2014-01-27 DIAGNOSIS — R109 Unspecified abdominal pain: Secondary | ICD-10-CM | POA: Diagnosis not present

## 2014-01-27 DIAGNOSIS — J4489 Other specified chronic obstructive pulmonary disease: Secondary | ICD-10-CM | POA: Diagnosis present

## 2014-01-27 LAB — URINALYSIS, ROUTINE W REFLEX MICROSCOPIC
BILIRUBIN URINE: NEGATIVE
GLUCOSE, UA: NEGATIVE mg/dL
Ketones, ur: 15 mg/dL — AB
Nitrite: NEGATIVE
PROTEIN: 100 mg/dL — AB
Specific Gravity, Urine: 1.021 (ref 1.005–1.030)
Urobilinogen, UA: 1 mg/dL (ref 0.0–1.0)
pH: 6.5 (ref 5.0–8.0)

## 2014-01-27 LAB — CBC WITH DIFFERENTIAL/PLATELET
BASOS ABS: 0.1 10*3/uL (ref 0.0–0.1)
Basophils Relative: 1 % (ref 0–1)
EOS ABS: 0.1 10*3/uL (ref 0.0–0.7)
EOS PCT: 1 % (ref 0–5)
HCT: 39.7 % (ref 36.0–46.0)
Hemoglobin: 13.8 g/dL (ref 12.0–15.0)
Lymphocytes Relative: 20 % (ref 12–46)
Lymphs Abs: 1.6 10*3/uL (ref 0.7–4.0)
MCH: 31.4 pg (ref 26.0–34.0)
MCHC: 34.8 g/dL (ref 30.0–36.0)
MCV: 90.4 fL (ref 78.0–100.0)
Monocytes Absolute: 0.7 10*3/uL (ref 0.1–1.0)
Monocytes Relative: 9 % (ref 3–12)
Neutro Abs: 5.3 10*3/uL (ref 1.7–7.7)
Neutrophils Relative %: 69 % (ref 43–77)
Platelets: 305 10*3/uL (ref 150–400)
RBC: 4.39 MIL/uL (ref 3.87–5.11)
RDW: 12.4 % (ref 11.5–15.5)
WBC: 7.8 10*3/uL (ref 4.0–10.5)

## 2014-01-27 LAB — COMPREHENSIVE METABOLIC PANEL
ALT: 19 U/L (ref 0–35)
AST: 29 U/L (ref 0–37)
Albumin: 4.1 g/dL (ref 3.5–5.2)
Alkaline Phosphatase: 73 U/L (ref 39–117)
Anion gap: 14 (ref 5–15)
BUN: 12 mg/dL (ref 6–23)
CALCIUM: 10 mg/dL (ref 8.4–10.5)
CO2: 30 mEq/L (ref 19–32)
Chloride: 95 mEq/L — ABNORMAL LOW (ref 96–112)
Creatinine, Ser: 0.7 mg/dL (ref 0.50–1.10)
GFR calc Af Amer: >90 mL/min (ref >90)
GFR calc non Af Amer: 85 mL/min — ABNORMAL LOW (ref >90)
Glucose, Bld: 124 mg/dL — ABNORMAL HIGH (ref 70–99)
Potassium: 4 mEq/L (ref 3.7–5.3)
Sodium: 139 mEq/L (ref 137–147)
TOTAL PROTEIN: 6.6 g/dL (ref 6.0–8.3)
Total Bilirubin: 0.4 mg/dL (ref 0.3–1.2)

## 2014-01-27 LAB — URINE MICROSCOPIC-ADD ON

## 2014-01-27 LAB — LIPASE, BLOOD: LIPASE: 30 U/L (ref 11–59)

## 2014-01-27 LAB — I-STAT CG4 LACTIC ACID, ED: LACTIC ACID, VENOUS: 0.94 mmol/L (ref 0.5–2.2)

## 2014-01-27 MED ORDER — IOHEXOL 300 MG/ML  SOLN
100.0000 mL | Freq: Once | INTRAMUSCULAR | Status: AC | PRN
  Administered 2014-01-27: 75 mL via INTRAVENOUS

## 2014-01-27 MED ORDER — IOHEXOL 300 MG/ML  SOLN
50.0000 mL | Freq: Once | INTRAMUSCULAR | Status: AC | PRN
  Administered 2014-01-27: 50 mL via ORAL

## 2014-01-27 MED ORDER — PROMETHAZINE HCL 25 MG PO TABS
12.5000 mg | ORAL_TABLET | Freq: Once | ORAL | Status: AC
  Administered 2014-01-27: 12.5 mg via ORAL
  Filled 2014-01-27: qty 1

## 2014-01-27 MED ORDER — MORPHINE SULFATE 4 MG/ML IJ SOLN
4.0000 mg | Freq: Once | INTRAMUSCULAR | Status: AC
  Administered 2014-01-27: 4 mg via INTRAVENOUS
  Filled 2014-01-27: qty 1

## 2014-01-27 MED ORDER — ONDANSETRON HCL 4 MG/2ML IJ SOLN
4.0000 mg | Freq: Once | INTRAMUSCULAR | Status: AC
  Administered 2014-01-27: 4 mg via INTRAVENOUS
  Filled 2014-01-27: qty 2

## 2014-01-27 MED ORDER — ONDANSETRON HCL 4 MG/2ML IJ SOLN
INTRAMUSCULAR | Status: AC
  Administered 2014-01-27: 4 mg via INTRAVENOUS
  Filled 2014-01-27: qty 2

## 2014-01-27 NOTE — ED Notes (Signed)
C/o abd pain, nausea since 830pm-has taken zofran and hydrocodone-no pain relief

## 2014-01-27 NOTE — ED Provider Notes (Signed)
CSN: [id number]     Arrival date & time 01/27/14  2022 History  This chart was scribed for Houston Siren III, * by Vernell Barrier, ED scribe. This patient was seen in room MH08/MH08 and the patient's care was started at 9:08 PM.    Chief Complaint  Patient presents with  . Abdominal Pain   Patient is a 73 y.o. female presenting with abdominal pain. The history is provided by the patient. No language interpreter was used.  Abdominal Pain Pain location:  Epigastric Pain quality: aching   Pain quality: not cramping   Pain radiates to:  Does not radiate Pain severity:  Moderate Onset quality:  Gradual Timing:  Intermittent Progression:  Worsening Chronicity:  New Context: laxative use and previous surgery   Relieved by:  Nothing Ineffective treatments: narcotic. Associated symptoms: nausea   Associated symptoms: no vomiting   Risk factors: being elderly and multiple surgeries    HPI Comments: Tamara Adams is a 73 y.o. female w/ hx of small bowel obstruction surgery, hysterectomy, oophorectomy, and cholecystectomy presents to the Emergency Department complaining of epigastric abdominal pain worse on the right; onset this morning. Reports nausea as well; no vomiting. States she has taken Zofran twice with some relief, last dosage 4 hours ago; but still nauseous. Last BM 1 day prior; taking MiraLAX regularly to keep BMs normal. Has not taken any yesterday or today. Took 1/2 of a hydrocodone pill this morning that seemed to relieve abdominal pain mildly; states within a few hours pain was back full force. Took the other half of the pill and reports no relief. Small bowel blockage approximately 1 years ago; states current pain is similar to this blockage pain.   Past Medical History  Diagnosis Date  . COPD (chronic obstructive pulmonary disease)     FVC 72%, FEV1 29%, FEV1 ratio 32% (very severe (COPD)  . Hypertension   . Pneumonia   . DVT (deep venous thrombosis)     "years ago after  a surgery"  . Pinched nerve     in back  . Peptic ulcer   . Colon polyp     removed, benign  . H/O hiatal hernia   . Degenerative disc disease   . Anemia     as a child  . Kidney stone    Past Surgical History  Procedure Laterality Date  . Abdominal hysterectomy  1987    and one ovary  . Oophorectomy  1992  . Cataract extraction w/phaco  06/12/2012    Dr. Boykin Reaper, left eye  . Cataract extraction  06/22/2012    Right eye, Dr. Lester Kinsman.   Marland Kitchen Urethra surgery    . Small intestine surgery      for a blockage, no bowel was removed, just kinked from scar tissue  . Cholecystectomy N/A 08/18/2013    Procedure: LAPAROSCOPIC CHOLECYSTECTOMY;  Surgeon: Harl Bowie, MD;  Location: [facility] OR;  Service: General;  Laterality: N/A;   Family History  Problem Relation Age of Onset  . Hypertension Mother   . Ovarian cancer Mother    History  Substance Use Topics  . Smoking status: Current Every Day Smoker -- 0.25 packs/day    Types: Cigarettes  . Smokeless tobacco: Never Used     Comment: Using Electronic cigarettes  . Alcohol Use: No   OB History   Grav Para Term Preterm Abortions TAB SAB Ect Mult Living  Review of Systems  Gastrointestinal: Positive for nausea and abdominal pain. Negative for vomiting.  All other systems reviewed and are negative.   Allergies  Maxidex and Tylox  Home Medications   Prior to Admission medications   Medication Sig Start Date End Date Taking? Authorizing Provider  albuterol (PROVENTIL) (2.5 MG/3ML) 0.083% nebulizer solution Take 3 mLs (2.5 mg total) by nebulization every 6 (six) hours as needed for wheezing or shortness of breath. 10/22/13   [name], MD  albuterol (VENTOLIN HFA) 108 (90 BASE) MCG/ACT inhaler Inhale 2 puffs into the lungs every 6 (six) hours as needed. 2-4 puffs inhaled every 4-6 hours 10/22/13   [name], MD  alendronate (FOSAMAX) 70 MG tablet Take 1 tablet (70 mg total) by mouth once a  week. Take with a full glass of water on an empty stomach. 06/24/13   [name], MD  amLODipine-benazepril (LOTREL) 10-20 MG per capsule TAKE 1 CAPSULE DAILY    [name], MD  amLODipine-benazepril (LOTREL) 5-40 MG per capsule Take 1 capsule by mouth daily. 04/22/13   [name], MD  beclomethasone (QVAR) 80 MCG/ACT inhaler Inhale 2 puffs into the lungs 2 (two) times daily. 10/22/13   [name], MD  LORazepam (ATIVAN) 1 MG tablet Take 1 tablet (1 mg total) by mouth daily as needed for anxiety or sleep. 01/21/14   [name], MD  omeprazole (PRILOSEC) 40 MG capsule TAKE 1 CAPSULE DAILY FOR GASTROINTESTINAL ULCER    [name], MD  ondansetron (ZOFRAN-ODT) 4 MG disintegrating tablet Take 1 tablet (4 mg total) by mouth every 8 (eight) hours as needed for nausea. 10/16/13   [name], MD  peg 3350 powder (MOVIPREP) 100 G SOLR Take 1 kit (200 g total) by mouth once. 10/27/13   [name], MD  pravastatin (PRAVACHOL) 40 MG tablet Take 1 tablet (40 mg total) by mouth at bedtime. 12/30/12   [name], MD  tiotropium (SPIRIVA HANDIHALER) 18 MCG inhalation capsule Use 2 inhalations from 1 capsule only once daily 10/22/13   [name], MD   Triage vitals: BP 161/69  Pulse 87  Temp(Src) 98.2 F (36.8 C) (Oral)  Resp 18  Ht 5' 4"  (1.626 m)  Wt 89 lb (40.37 kg)  BMI 15.27 kg/m2  SpO2 92%  Physical Exam  Nursing note and vitals reviewed. Constitutional: She is oriented to person, place, and time. She appears well-developed and well-nourished. No distress.  HENT:  Head: Normocephalic and atraumatic.  Eyes: Conjunctivae are normal. No scleral icterus.  Neck: Neck supple.  Cardiovascular: Normal rate and intact distal pulses.   Pulmonary/Chest: Effort normal. No stridor. No respiratory distress.  Abdominal: Soft. Normal appearance. She exhibits no distension. There is tenderness (generalized, worst on right).  There is no rebound and no guarding.  Neurological: She is alert and oriented to person, place, and time.  Skin: Skin is warm and dry. No rash noted.  Psychiatric: She has a normal mood and affect. Her behavior is normal.    ED Course  Procedures (including critical care time) DIAGNOSTIC STUDIES: Oxygen Saturation is 92% on room air, normal by my interpretation.    COORDINATION OF CARE: At 9:14 PM: Discussed treatment plan with patient which includes IV fluids, pain medication, nausea medication, blood work, and abdominal CAT scan. Patient agrees.   Labs Review Labs Reviewed  URINALYSIS, ROUTINE W REFLEX MICROSCOPIC - Abnormal; Notable for the following:    Hgb urine  dipstick LARGE (*)    Ketones, ur 15 (*)    Protein, ur 100 (*)    Leukocytes, UA SMALL (*)    All other components within normal limits  COMPREHENSIVE METABOLIC PANEL - Abnormal; Notable for the following:    Chloride 95 (*)    Glucose, Bld 124 (*)    GFR calc non Af Amer 85 (*)    All other components within normal limits  BASIC METABOLIC PANEL - Abnormal; Notable for the following:    Sodium 135 (*)    Chloride 91 (*)    Glucose, Bld 150 (*)    GFR calc non Af Amer 66 (*)    GFR calc Af Amer 76 (*)    All other components within normal limits  CBC - Abnormal; Notable for the following:    WBC 19.0 (*)    All other components within normal limits  APTT - Abnormal; Notable for the following:    aPTT 38 (*)    All other components within normal limits  URINE MICROSCOPIC-ADD ON  CBC WITH DIFFERENTIAL  LIPASE, BLOOD  PREALBUMIN  I-STAT CG4 LACTIC ACID, ED    Imaging Review Ct Abdomen Pelvis W Contrast  01/28/2014   CLINICAL DATA:  Abdominal pain.  Nausea and vomiting.  EXAM: CT ABDOMEN AND PELVIS WITH CONTRAST  TECHNIQUE: Multidetector CT imaging of the abdomen and pelvis was performed using the standard protocol following bolus administration of intravenous contrast.  CONTRAST:  15m OMNIPAQUE IOHEXOL 300  MG/ML  SOLN  COMPARISON:  08/15/2013  FINDINGS: Stomach and multiple small bowel loops are dilated, with transition point seen in the right lower quadrant. There is a swirling pattern of the small bowel mesentery at the transition best seen on coronal MPRs, and this is suspicious for an adhesion or internal hernia. No evidence of bowel wall thickening or pneumatosis. Colonic diverticulosis is seen, without evidence of diverticulitis. No focal inflammatory process or abscess identified. No evidence of free fluid. Small to moderate hiatal hernia noted.  Several hypervascular and hypovascular lesions are again seen in both right and left hepatic lobes. These remains stable and are likely benign. No new or enlarging liver masses are identified. Surgical clips seen from prior cholecystectomy. The pancreas, spleen, adrenal glands, and kidneys are normal in appearance. No evidence of hydronephrosis. No lymphadenopathy identified.  IMPRESSION: Distal small bowel obstruction, with transition point in the right lower quadrant, likely due to adhesion or internal hernia.  Diverticulosis. No radiographic evidence of diverticulitis.  Indeterminate liver lesions remain stable, and are therefore likely benign. Recommend continued followup by CT in 6 months, or alternatively abdomen MRI without and with contrast could be performed for further characterization.   Electronically Signed   By: JEarle GellM.D.   On: 01/28/2014 00:24  All radiology studies independently viewed by me.      EKG Interpretation None      MDM   Final diagnoses:  Small bowel obstruction   73yo female with hx of SBO presenting with abd pain and nausea.  She states similar pain to when she had SBO.  Plan IV morphine/zofran for pain.  Labs and CT A/P.    Ct shows SBO.  Admit to surgery.    I personally performed the services described in this documentation, which was scribed in my presence. The recorded information has been reviewed and is  accurate.     JHouston SirenIII, MD 01/28/14 [phone]

## 2014-01-28 DIAGNOSIS — K56609 Unspecified intestinal obstruction, unspecified as to partial versus complete obstruction: Secondary | ICD-10-CM

## 2014-01-28 LAB — BASIC METABOLIC PANEL
ANION GAP: 14 (ref 5–15)
BUN: 18 mg/dL (ref 6–23)
CALCIUM: 9.7 mg/dL (ref 8.4–10.5)
CO2: 30 mEq/L (ref 19–32)
CREATININE: 0.86 mg/dL (ref 0.50–1.10)
Chloride: 91 mEq/L — ABNORMAL LOW (ref 96–112)
GFR calc Af Amer: 76 mL/min — ABNORMAL LOW (ref >90)
GFR, EST NON AFRICAN AMERICAN: 66 mL/min — AB (ref >90)
Glucose, Bld: 150 mg/dL — ABNORMAL HIGH (ref 70–99)
Potassium: 4.4 mEq/L (ref 3.7–5.3)
Sodium: 135 mEq/L — ABNORMAL LOW (ref 137–147)

## 2014-01-28 LAB — CBC
HCT: 43.5 % (ref 36.0–46.0)
Hemoglobin: 14.8 g/dL (ref 12.0–15.0)
MCH: 31.4 pg (ref 26.0–34.0)
MCHC: 34 g/dL (ref 30.0–36.0)
MCV: 92.2 fL (ref 78.0–100.0)
PLATELETS: 299 10*3/uL (ref 150–400)
RBC: 4.72 MIL/uL (ref 3.87–5.11)
RDW: 13 % (ref 11.5–15.5)
WBC: 19 10*3/uL — ABNORMAL HIGH (ref 4.0–10.5)

## 2014-01-28 LAB — APTT: APTT: 38 s — AB (ref 24–37)

## 2014-01-28 LAB — PREALBUMIN: PREALBUMIN: 21.6 mg/dL (ref 17.0–34.0)

## 2014-01-28 MED ORDER — ONDANSETRON HCL 4 MG/2ML IJ SOLN
4.0000 mg | Freq: Once | INTRAMUSCULAR | Status: AC
  Administered 2014-01-27: 4 mg via INTRAVENOUS

## 2014-01-28 MED ORDER — ALBUTEROL SULFATE HFA 108 (90 BASE) MCG/ACT IN AERS: 2.0000 | INHALATION_SPRAY | Freq: Four times a day (QID) | RESPIRATORY_TRACT | Status: DC | PRN

## 2014-01-28 MED ORDER — PROMETHAZINE HCL 25 MG/ML IJ SOLN
6.2500 mg | Freq: Four times a day (QID) | INTRAMUSCULAR | Status: DC | PRN
  Administered 2014-01-28: 6.25 mg via INTRAVENOUS
  Filled 2014-01-28: qty 1

## 2014-01-28 MED ORDER — DOCUSATE SODIUM 100 MG PO CAPS
100.0000 mg | ORAL_CAPSULE | Freq: Two times a day (BID) | ORAL | Status: DC
  Administered 2014-01-28 – 2014-01-31 (×5): 100 mg via ORAL
  Filled 2014-01-28 (×8): qty 1

## 2014-01-28 MED ORDER — BENAZEPRIL HCL 20 MG PO TABS
20.0000 mg | ORAL_TABLET | Freq: Every day | ORAL | Status: DC
  Administered 2014-01-28 – 2014-01-31 (×4): 20 mg via ORAL
  Filled 2014-01-28 (×4): qty 1

## 2014-01-28 MED ORDER — AMLODIPINE BESY-BENAZEPRIL HCL 10-20 MG PO CAPS
1.0000 | ORAL_CAPSULE | Freq: Every day | ORAL | Status: DC
Start: 2014-01-28 — End: 2014-01-28

## 2014-01-28 MED ORDER — MENTHOL 3 MG MT LOZG
1.0000 | LOZENGE | OROMUCOSAL | Status: DC | PRN
Start: 2014-01-28 — End: 2014-01-31

## 2014-01-28 MED ORDER — HYDROMORPHONE HCL PF 1 MG/ML IJ SOLN
0.5000 mg | INTRAMUSCULAR | Status: DC | PRN
  Administered 2014-01-28 – 2014-01-29 (×3): 0.5 mg via INTRAVENOUS
  Filled 2014-01-28 (×3): qty 1

## 2014-01-28 MED ORDER — ALBUTEROL SULFATE (2.5 MG/3ML) 0.083% IN NEBU
2.5000 mg | INHALATION_SOLUTION | Freq: Four times a day (QID) | RESPIRATORY_TRACT | Status: DC | PRN
  Administered 2014-01-28 – 2014-01-29 (×3): 2.5 mg via RESPIRATORY_TRACT
  Filled 2014-01-28 (×3): qty 3

## 2014-01-28 MED ORDER — DIPHENHYDRAMINE HCL 50 MG/ML IJ SOLN: 12.5000 mg | Freq: Four times a day (QID) | INTRAMUSCULAR | Status: DC | PRN

## 2014-01-28 MED ORDER — TIOTROPIUM BROMIDE MONOHYDRATE 18 MCG IN CAPS
18.0000 ug | ORAL_CAPSULE | Freq: Every day | RESPIRATORY_TRACT | Status: DC
  Administered 2014-01-28 – 2014-01-31 (×4): 18 ug via RESPIRATORY_TRACT
  Filled 2014-01-28: qty 5

## 2014-01-28 MED ORDER — CHLORHEXIDINE GLUCONATE 0.12 % MT SOLN
15.0000 mL | Freq: Two times a day (BID) | OROMUCOSAL | Status: DC
  Administered 2014-01-28 – 2014-01-30 (×6): 15 mL via OROMUCOSAL
  Filled 2014-01-28 (×9): qty 15

## 2014-01-28 MED ORDER — LIP MEDEX EX OINT
1.0000 "application " | TOPICAL_OINTMENT | Freq: Two times a day (BID) | CUTANEOUS | Status: DC
  Administered 2014-01-28 – 2014-01-30 (×6): 1 via TOPICAL
  Filled 2014-01-28: qty 7

## 2014-01-28 MED ORDER — SODIUM CHLORIDE 0.9 % IV SOLN
INTRAVENOUS | Status: AC
  Administered 2014-01-28: 01:00:00 via INTRAVENOUS

## 2014-01-28 MED ORDER — BISACODYL 10 MG RE SUPP: 10.0000 mg | Freq: Two times a day (BID) | RECTAL | Status: DC | PRN

## 2014-01-28 MED ORDER — FLUTICASONE PROPIONATE HFA 44 MCG/ACT IN AERO
2.0000 | INHALATION_SPRAY | Freq: Two times a day (BID) | RESPIRATORY_TRACT | Status: DC
  Administered 2014-01-28 – 2014-01-31 (×7): 2 via RESPIRATORY_TRACT
  Filled 2014-01-28: qty 10.6

## 2014-01-28 MED ORDER — LORAZEPAM 1 MG PO TABS: 1.0000 mg | ORAL_TABLET | Freq: Every day | ORAL | Status: DC | PRN

## 2014-01-28 MED ORDER — AMLODIPINE BESYLATE 10 MG PO TABS
10.0000 mg | ORAL_TABLET | Freq: Every day | ORAL | Status: DC
  Administered 2014-01-28 – 2014-01-31 (×4): 10 mg via ORAL
  Filled 2014-01-28 (×4): qty 1

## 2014-01-28 MED ORDER — ONDANSETRON HCL 4 MG/2ML IJ SOLN: 4.0000 mg | Freq: Three times a day (TID) | INTRAMUSCULAR | Status: AC | PRN

## 2014-01-28 MED ORDER — ACETAMINOPHEN 650 MG RE SUPP: 650.0000 mg | Freq: Four times a day (QID) | RECTAL | Status: DC | PRN

## 2014-01-28 MED ORDER — KCL IN DEXTROSE-NACL 20-5-0.45 MEQ/L-%-% IV SOLN
INTRAVENOUS | Status: DC
  Administered 2014-01-28: 19:00:00 via INTRAVENOUS
  Administered 2014-01-28: 75 mL via INTRAVENOUS
  Administered 2014-01-29 – 2014-01-30 (×2): via INTRAVENOUS
  Filled 2014-01-28 (×8): qty 1000

## 2014-01-28 MED ORDER — PANTOPRAZOLE SODIUM 40 MG PO TBEC
40.0000 mg | DELAYED_RELEASE_TABLET | Freq: Every day | ORAL | Status: DC
  Administered 2014-01-28 – 2014-01-31 (×4): 40 mg via ORAL
  Filled 2014-01-28 (×4): qty 1

## 2014-01-28 MED ORDER — MAGIC MOUTHWASH
15.0000 mL | Freq: Four times a day (QID) | ORAL | Status: DC | PRN
Start: 2014-01-28 — End: 2014-01-31
  Filled 2014-01-28: qty 15

## 2014-01-28 MED ORDER — HYDROCODONE-ACETAMINOPHEN 5-325 MG PO TABS
1.0000 | ORAL_TABLET | ORAL | Status: DC | PRN
  Administered 2014-01-29 – 2014-01-30 (×2): 1 via ORAL
  Filled 2014-01-28 (×2): qty 1

## 2014-01-28 MED ORDER — LACTATED RINGERS IV BOLUS (SEPSIS): 1000.0000 mL | Freq: Three times a day (TID) | INTRAVENOUS | Status: AC | PRN

## 2014-01-28 MED ORDER — ENOXAPARIN SODIUM 40 MG/0.4ML ~~LOC~~ SOLN
40.0000 mg | SUBCUTANEOUS | Status: DC
  Administered 2014-01-28: 40 mg via SUBCUTANEOUS
  Filled 2014-01-28 (×2): qty 0.4

## 2014-01-28 MED ORDER — ALUM & MAG HYDROXIDE-SIMETH 200-200-20 MG/5ML PO SUSP: 30.0000 mL | Freq: Four times a day (QID) | ORAL | Status: DC | PRN

## 2014-01-28 MED ORDER — PROMETHAZINE HCL 25 MG/ML IJ SOLN
12.5000 mg | Freq: Once | INTRAMUSCULAR | Status: AC
  Administered 2014-01-28: 12.5 mg via INTRAVENOUS
  Filled 2014-01-28: qty 1

## 2014-01-28 MED ORDER — PHENOL 1.4 % MT LIQD: 2.0000 | OROMUCOSAL | Status: DC | PRN

## 2014-01-28 MED ORDER — BIOTENE DRY MOUTH MT LIQD
15.0000 mL | Freq: Two times a day (BID) | OROMUCOSAL | Status: DC
  Administered 2014-01-28 – 2014-01-30 (×4): 15 mL via OROMUCOSAL

## 2014-01-28 NOTE — Progress Notes (Signed)
Subjective: She says she's sick, but a little better.  NG cannister working, about 1/2 full, she got to [employer] this AM from [facility].  She reports she is still smoking at home.  Objective: Vital signs in last 24 hours: Temp:  [97.9 F (36.6 C)-99.7 F (37.6 C)] 99.7 F (37.6 C) (07/09 0955) Pulse Rate:  [72-101] 101 (07/09 0955) Resp:  [13-20] 18 (07/09 0955) BP: (132-180)/(58-78) 132/67 mmHg (07/09 0955) SpO2:  [87 %-99 %] 95 % (07/09 1010) Weight:  [40.37 kg (89 lb)] 40.37 kg (89 lb) (07/08 2027) Last BM Date: 01/25/14 400 from NG last PM TM 99.7 Na down 135, glucose up WBC 19.0 CT scan:  Distal small bowel obstruction, with transition point in the right lower quadrant, likely due to adhesion or internal hernia.   Intake/Output from previous day: 07/08 0701 - 07/09 0700 In: -  Out: 900 [Emesis/NG output:400] Intake/Output this shift: Total I/O In: 502.5 [I.V.:302.5; NG/GT:200] Out: 300 [Urine:300]  General appearance: alert, cooperative and no distress Resp: some mild wheezing with deep inspiration. GI: soft, still tender.  no BS, some flatus  Lab Results:   Recent Labs  01/27/14 2136 01/28/14 0514  WBC 7.8 19.0*  HGB 13.8 14.8  HCT 39.7 43.5  PLT 305 299    BMET  Recent Labs  01/27/14 2136 01/28/14 0514  NA 139 135*  K 4.0 4.4  CL 95* 91*  CO2 30 30  GLUCOSE 124* 150*  BUN 12 18  CREATININE 0.70 0.86  CALCIUM 10.0 9.7   PT/INR No results found for this basename: LABPROT, INR,  in the last 72 hours   Recent Labs Lab 01/27/14 2136  AST 29  ALT 19  ALKPHOS 73  BILITOT 0.4  PROT 6.6  ALBUMIN 4.1     Lipase     Component Value Date/Time   LIPASE 30 01/27/2014 2136     Studies/Results: Ct Abdomen Pelvis W Contrast  01/28/2014   CLINICAL DATA:  Abdominal pain.  Nausea and vomiting.  EXAM: CT ABDOMEN AND PELVIS WITH CONTRAST  TECHNIQUE: Multidetector CT imaging of the abdomen and pelvis was performed using the standard protocol following bolus  administration of intravenous contrast.  CONTRAST:  58mL OMNIPAQUE IOHEXOL 300 MG/ML  SOLN  COMPARISON:  08/15/2013  FINDINGS: Stomach and multiple small bowel loops are dilated, with transition point seen in the right lower quadrant. There is a swirling pattern of the small bowel mesentery at the transition best seen on coronal MPRs, and this is suspicious for an adhesion or internal hernia. No evidence of bowel wall thickening or pneumatosis. Colonic diverticulosis is seen, without evidence of diverticulitis. No focal inflammatory process or abscess identified. No evidence of free fluid. Small to moderate hiatal hernia noted.  Several hypervascular and hypovascular lesions are again seen in both right and left hepatic lobes. These remains stable and are likely benign. No new or enlarging liver masses are identified. Surgical clips seen from prior cholecystectomy. The pancreas, spleen, adrenal glands, and kidneys are normal in appearance. No evidence of hydronephrosis. No lymphadenopathy identified.  IMPRESSION: Distal small bowel obstruction, with transition point in the right lower quadrant, likely due to adhesion or internal hernia.  Diverticulosis. No radiographic evidence of diverticulitis.  Indeterminate liver lesions remain stable, and are therefore likely benign. Recommend continued followup by CT in 6 months, or alternatively abdomen MRI without and with contrast could be performed for further characterization.   Electronically Signed   By: Sharrie Rothman.D.  On: 01/28/2014 00:24    Medications: . sodium chloride   Intravenous STAT  . amLODipine  10 mg Oral Daily  . antiseptic oral rinse  15 mL Mouth Rinse q12n4p  . benazepril  20 mg Oral Daily  . chlorhexidine  15 mL Mouth Rinse BID  . docusate sodium  100 mg Oral BID  . enoxaparin (LOVENOX) injection  40 mg Subcutaneous Q24H  . fluticasone  2 puff Inhalation BID  . lip balm  1 application Topical BID  . pantoprazole  40 mg Oral Daily  .  tiotropium  18 mcg Inhalation Daily    Assessment/Plan Recurrent SBO Hx of cholecystectomy 08/18/13, abdominal hysterectomy 1987, SBO with surgery in [place], 12/2012. COPD on home O2 at night/still smoking at home. Hx of DVT - on lovenox  Hiatal hernia  Plan:  Continue NG drainage, bowel rest, and hydration.  Dr. Marcello Moores has her back on her COPD medicines. Recheck labs in AM.    LOS: 1 day    [employer] 01/28/2014

## 2014-01-28 NOTE — Progress Notes (Signed)
INITIAL NUTRITION ASSESSMENT  Pt meets criteria for severe MALNUTRITION in the context of chronic illness as evidenced by severe muscle wasting and subcutaneous fat loss throughout body.  DOCUMENTATION CODES Per approved criteria  -Severe malnutrition in the context of chronic illness -Underweight   INTERVENTION: - Diet advancement per MD - If small bowel obstruction persists and pt likely to need TPN > 7 days, recommend TPN - Educated pt on high calorie/protein diet  - RD to monitor plan of care  NUTRITION DIAGNOSIS: Inadequate oral intake related to inability to eat as evidenced by NPO.   Goal: Advance diet as tolerated to regular diet   Monitor:  Weights, labs, diet advancement, NGT output   Reason for Assessment: Underweight  73 y.o. female  Admitting Dx: RLQ pain   ASSESSMENT: Pt with hx of small bowel obstruction with LOA 1 yr ago, hysterectomy, oophorectomy, and cholecystectomy. She presents to the Emergency Department complaining of epigastric abdominal pain worse on the right; onset yesterday morning. Reports nausea as well; no vomiting. States she has taken Zofran twice with some relief, last dosage 4 hours ago; but still nauseous. Last BM 1 day prior; taking MiraLAX regularly to keep BMs normal.   - Met with pt who reports good appetite at home, 2 good meals/day with protein/vegetables and lots of snacks - examples would be Chex-mix and bananas - Drinks 2 Ensure Plus/day - Has been having trouble gaining weight - Michela Pitcher she weighed 124 pounds over 2 years ago, now weighs 89 pounds - States NGT has been helping nausea, abdominal pain improving - Had 441m brown total NGT output yesterday - Performed nutrition focused physical exam and pt with severe wasting in all areas  - Found to have small bowel obstruction    Height: Ht Readings from Last 1 Encounters:  01/27/14 5' 4"  (1.626 m)    Weight: Wt Readings from Last 1 Encounters:  01/27/14 89 lb (40.37 kg)     Ideal Body Weight: 120 lbs   % Ideal Body Weight: 74%  Wt Readings from Last 10 Encounters:  01/27/14 89 lb (40.37 kg)  12/07/13 92 lb (41.731 kg)  10/16/13 89 lb (40.37 kg)  08/28/13 87 lb (39.463 kg)  08/15/13 85 lb 14.4 oz (38.964 kg)  08/15/13 85 lb 14.4 oz (38.964 kg)  08/12/13 88 lb 12.8 oz (40.279 kg)  07/27/13 88 lb (39.917 kg)  07/09/13 86 lb 3.2 oz (39.1 kg)  07/01/13 90 lb (40.824 kg)    Usual Body Weight: 124 lbs over 2 years ago per pt  % Usual Body Weight: 72%  BMI:  Body mass index is 15.27 kg/(m^2). Underweight  Estimated Nutritional Needs: Kcal: 1400-1600 Protein: 60-80g Fluid: 1.4-1.6L/day   Skin: Intact  Diet Order: NPO  EDUCATION NEEDS: -Education needs addressed - educated pt on high calorie/protein diet and provided handouts with RD contact information   Intake/Output Summary (Last 24 hours) at 01/28/14 1047 Last data filed at 01/28/14 1007  Gross per 24 hour  Intake  502.5 ml  Output   1200 ml  Net -697.5 ml    Last BM: 7/6  Labs:   Recent Labs Lab 01/27/14 2136 01/28/14 0514  NA 139 135*  K 4.0 4.4  CL 95* 91*  CO2 30 30  BUN 12 18  CREATININE 0.70 0.86  CALCIUM 10.0 9.7  GLUCOSE 124* 150*    CBG (last 3)  No results found for this basename: GLUCAP,  in the last 72 hours  Scheduled Meds: .  sodium chloride   Intravenous STAT  . amLODipine  10 mg Oral Daily  . antiseptic oral rinse  15 mL Mouth Rinse q12n4p  . benazepril  20 mg Oral Daily  . chlorhexidine  15 mL Mouth Rinse BID  . docusate sodium  100 mg Oral BID  . enoxaparin (LOVENOX) injection  40 mg Subcutaneous Q24H  . fluticasone  2 puff Inhalation BID  . lip balm  1 application Topical BID  . pantoprazole  40 mg Oral Daily  . tiotropium  18 mcg Inhalation Daily    Continuous Infusions: . dextrose 5 % and 0.45 % NaCl with KCl 20 mEq/L 75 mL (01/28/14 0737)    Past Medical History  Diagnosis Date  . COPD (chronic obstructive pulmonary disease)      FVC 72%, FEV1 29%, FEV1 ratio 32% (very severe (COPD)  . Hypertension   . Pneumonia   . DVT (deep venous thrombosis)     "years ago after a surgery"  . Pinched nerve     in back  . Peptic ulcer   . Colon polyp     removed, benign  . H/O hiatal hernia   . Degenerative disc disease   . Anemia     as a child  . Kidney stone     Past Surgical History  Procedure Laterality Date  . Abdominal hysterectomy  1987    and one ovary  . Oophorectomy  1992  . Cataract extraction w/phaco  06/12/2012    Dr. Boykin Reaper, left eye  . Cataract extraction  06/22/2012    Right eye, Dr. Lester Kinsman.   Marland Kitchen Urethra surgery    . Small intestine surgery      for a blockage, no bowel was removed, just kinked from scar tissue  . Cholecystectomy N/A 08/18/2013    Procedure: LAPAROSCOPIC CHOLECYSTECTOMY;  Surgeon: Harl Bowie, MD;  Location: [facility];  Service: General;  Laterality: N/A;    Carlis Stable MS, [place], LDN [phone] Pager [phone] Weekend/After Hours Pager

## 2014-01-28 NOTE — Progress Notes (Signed)
Continue nonoperative management.  Hopefully bowel obstruction resolved the swelling.  Followup in a.m.

## 2014-01-28 NOTE — H&P (Signed)
Tamara Adams is an 73 y.o. female.   Chief Complaint: RLQ pain HPI: Tamara Adams is a 73 y.o. female w/ hx of small bowel obstruction with LOA 1 yr ago, hysterectomy, oophorectomy, and cholecystectomy.  She presents to the Emergency Department complaining of epigastric abdominal pain worse on the right; onset yesterday morning. Reports nausea as well; no vomiting. States she has taken Zofran twice with some relief, last dosage 4 hours ago; but still nauseous. Last BM 1 day prior; taking MiraLAX regularly to keep BMs normal.     Past Medical History  Diagnosis Date  . COPD (chronic obstructive pulmonary disease)     FVC 72%, FEV1 29%, FEV1 ratio 32% (very severe (COPD)  . Hypertension   . Pneumonia   . DVT (deep venous thrombosis)     "years ago after a surgery"  . Pinched nerve     in back  . Peptic ulcer   . Colon polyp     removed, benign  . H/O hiatal hernia   . Degenerative disc disease   . Anemia     as a child  . Kidney stone     Past Surgical History  Procedure Laterality Date  . Abdominal hysterectomy  1987    and one ovary  . Oophorectomy  1992  . Cataract extraction w/phaco  06/12/2012    Dr. Boykin Reaper, left eye  . Cataract extraction  06/22/2012    Right eye, Dr. Lester Kinsman.   Marland Kitchen Urethra surgery    . Small intestine surgery      for a blockage, no bowel was removed, just kinked from scar tissue  . Cholecystectomy N/A 08/18/2013    Procedure: LAPAROSCOPIC CHOLECYSTECTOMY;  Surgeon: Harl Bowie, MD;  Location: [facility] OR;  Service: General;  Laterality: N/A;    Family History  Problem Relation Age of Onset  . Hypertension Mother   . Ovarian cancer Mother    Social History:  reports that she has been smoking Cigarettes.  She has been smoking about 0.25 packs per day. She has never used smokeless tobacco. She reports that she does not drink alcohol or use illicit drugs.  Allergies:  Allergies  Allergen Reactions  . Maxidex [Dexamethasone] Other (See Comments)     dehydration  . Tylox [Oxycodone-Acetaminophen] Itching    Medications Prior to Admission  Medication Sig Dispense Refill  . albuterol (PROVENTIL) (2.5 MG/3ML) 0.083% nebulizer solution Take 3 mLs (2.5 mg total) by nebulization every 6 (six) hours as needed for wheezing or shortness of breath.  270 vial  2  . alendronate (FOSAMAX) 70 MG tablet Take 1 tablet (70 mg total) by mouth once a week. Take with a full glass of water on an empty stomach.  12 tablet  3  . amLODipine-benazepril (LOTREL) 10-20 MG per capsule Take 1 capsule by mouth daily.      . beclomethasone (QVAR) 80 MCG/ACT inhaler Inhale 2 puffs into the lungs 2 (two) times daily.  3 Inhaler  2  . LORazepam (ATIVAN) 1 MG tablet Take 1 tablet (1 mg total) by mouth daily as needed for anxiety or sleep.  30 tablet  1  . Multiple Vitamin (MULTIVITAMIN WITH MINERALS) TABS tablet Take 1 tablet by mouth daily.      Marland Kitchen omeprazole (PRILOSEC) 40 MG capsule Take 40 mg by mouth daily.      . ondansetron (ZOFRAN-ODT) 4 MG disintegrating tablet Take 1 tablet (4 mg total) by mouth every 8 (eight) hours as needed for nausea.  20 tablet  1  . pravastatin (PRAVACHOL) 40 MG tablet Take 1 tablet (40 mg total) by mouth at bedtime.  90 tablet  0  . tiotropium (SPIRIVA HANDIHALER) 18 MCG inhalation capsule Use 2 inhalations from 1 capsule only once daily  90 capsule  3  . albuterol (VENTOLIN HFA) 108 (90 BASE) MCG/ACT inhaler Inhale 2 puffs into the lungs every 6 (six) hours as needed. 2-4 puffs inhaled every 4-6 hours  3 Inhaler  3    Results for orders placed during the hospital encounter of 01/27/14 (from the past 48 hour(s))  URINALYSIS, ROUTINE W REFLEX MICROSCOPIC     Status: Abnormal   Collection Time    01/27/14  8:41 PM      Result Value Ref Range   Color, Urine YELLOW  YELLOW   APPearance CLEAR  CLEAR   Specific Gravity, Urine 1.021  1.005 - 1.030   pH 6.5  5.0 - 8.0   Glucose, UA NEGATIVE  NEGATIVE mg/dL   Hgb urine dipstick LARGE (*)  NEGATIVE   Bilirubin Urine NEGATIVE  NEGATIVE   Ketones, ur 15 (*) NEGATIVE mg/dL   Protein, ur 100 (*) NEGATIVE mg/dL   Urobilinogen, UA 1.0  0.0 - 1.0 mg/dL   Nitrite NEGATIVE  NEGATIVE   Leukocytes, UA SMALL (*) NEGATIVE  URINE MICROSCOPIC-ADD ON     Status: None   Collection Time    01/27/14  8:41 PM      Result Value Ref Range   Squamous Epithelial / LPF RARE  RARE   WBC, UA 3-6  <3 WBC/hpf   RBC / HPF 11-20  <3 RBC/hpf   Bacteria, UA RARE  RARE  CBC WITH DIFFERENTIAL     Status: None   Collection Time    01/27/14  9:36 PM      Result Value Ref Range   WBC 7.8  4.0 - 10.5 K/uL   RBC 4.39  3.87 - 5.11 MIL/uL   Hemoglobin 13.8  12.0 - 15.0 g/dL   HCT 39.7  36.0 - 46.0 %   MCV 90.4  78.0 - 100.0 fL   MCH 31.4  26.0 - 34.0 pg   MCHC 34.8  30.0 - 36.0 g/dL   RDW 12.4  11.5 - 15.5 %   Platelets 305  150 - 400 K/uL   Neutrophils Relative % 69  43 - 77 %   Neutro Abs 5.3  1.7 - 7.7 K/uL   Lymphocytes Relative 20  12 - 46 %   Lymphs Abs 1.6  0.7 - 4.0 K/uL   Monocytes Relative 9  3 - 12 %   Monocytes Absolute 0.7  0.1 - 1.0 K/uL   Eosinophils Relative 1  0 - 5 %   Eosinophils Absolute 0.1  0.0 - 0.7 K/uL   Basophils Relative 1  0 - 1 %   Basophils Absolute 0.1  0.0 - 0.1 K/uL  COMPREHENSIVE METABOLIC PANEL     Status: Abnormal   Collection Time    01/27/14  9:36 PM      Result Value Ref Range   Sodium 139  137 - 147 mEq/L   Potassium 4.0  3.7 - 5.3 mEq/L   Chloride 95 (*) 96 - 112 mEq/L   CO2 30  19 - 32 mEq/L   Glucose, Bld 124 (*) 70 - 99 mg/dL   BUN 12  6 - 23 mg/dL   Creatinine, Ser 0.70  0.50 - 1.10 mg/dL   Calcium 10.0  8.4 - 10.5 mg/dL   Total Protein 6.6  6.0 - 8.3 g/dL   Albumin 4.1  3.5 - 5.2 g/dL   AST 29  0 - 37 U/L   ALT 19  0 - 35 U/L   Alkaline Phosphatase 73  39 - 117 U/L   Total Bilirubin 0.4  0.3 - 1.2 mg/dL   GFR calc non Af Amer 85 (*) >90 mL/min   GFR calc Af Amer >90  >90 mL/min   Comment: (NOTE)     The eGFR has been calculated using the  CKD EPI equation.     This calculation has not been validated in all clinical situations.     eGFR's persistently <90 mL/min signify possible Chronic Kidney     Disease.   Anion gap 14  5 - 15  LIPASE, BLOOD     Status: None   Collection Time    01/27/14  9:36 PM      Result Value Ref Range   Lipase 30  11 - 59 U/L  I-STAT CG4 LACTIC ACID, ED     Status: None   Collection Time    01/27/14  9:52 PM      Result Value Ref Range   Lactic Acid, Venous 0.94  0.5 - 2.2 mmol/L  BASIC METABOLIC PANEL     Status: Abnormal   Collection Time    01/28/14  5:14 AM      Result Value Ref Range   Sodium 135 (*) 137 - 147 mEq/L   Potassium 4.4  3.7 - 5.3 mEq/L   Chloride 91 (*) 96 - 112 mEq/L   CO2 30  19 - 32 mEq/L   Glucose, Bld 150 (*) 70 - 99 mg/dL   BUN 18  6 - 23 mg/dL   Creatinine, Ser 0.86  0.50 - 1.10 mg/dL   Calcium 9.7  8.4 - 10.5 mg/dL   GFR calc non Af Amer 66 (*) >90 mL/min   GFR calc Af Amer 76 (*) >90 mL/min   Comment: (NOTE)     The eGFR has been calculated using the CKD EPI equation.     This calculation has not been validated in all clinical situations.     eGFR's persistently <90 mL/min signify possible Chronic Kidney     Disease.   Anion gap 14  5 - 15  CBC     Status: Abnormal   Collection Time    01/28/14  5:14 AM      Result Value Ref Range   WBC 19.0 (*) 4.0 - 10.5 K/uL   RBC 4.72  3.87 - 5.11 MIL/uL   Hemoglobin 14.8  12.0 - 15.0 g/dL   HCT 43.5  36.0 - 46.0 %   MCV 92.2  78.0 - 100.0 fL   MCH 31.4  26.0 - 34.0 pg   MCHC 34.0  30.0 - 36.0 g/dL   RDW 13.0  11.5 - 15.5 %   Platelets 299  150 - 400 K/uL   Ct Abdomen Pelvis W Contrast  01/28/2014   CLINICAL DATA:  Abdominal pain.  Nausea and vomiting.  EXAM: CT ABDOMEN AND PELVIS WITH CONTRAST  TECHNIQUE: Multidetector CT imaging of the abdomen and pelvis was performed using the standard protocol following bolus administration of intravenous contrast.  CONTRAST:  64m OMNIPAQUE IOHEXOL 300 MG/ML  SOLN  COMPARISON:   08/15/2013  FINDINGS: Stomach and multiple small bowel loops are dilated, with transition point seen in the right lower quadrant. There is  a swirling pattern of the small bowel mesentery at the transition best seen on coronal MPRs, and this is suspicious for an adhesion or internal hernia. No evidence of bowel wall thickening or pneumatosis. Colonic diverticulosis is seen, without evidence of diverticulitis. No focal inflammatory process or abscess identified. No evidence of free fluid. Small to moderate hiatal hernia noted.  Several hypervascular and hypovascular lesions are again seen in both right and left hepatic lobes. These remains stable and are likely benign. No new or enlarging liver masses are identified. Surgical clips seen from prior cholecystectomy. The pancreas, spleen, adrenal glands, and kidneys are normal in appearance. No evidence of hydronephrosis. No lymphadenopathy identified.  IMPRESSION: Distal small bowel obstruction, with transition point in the right lower quadrant, likely due to adhesion or internal hernia.  Diverticulosis. No radiographic evidence of diverticulitis.  Indeterminate liver lesions remain stable, and are therefore likely benign. Recommend continued followup by CT in 6 months, or alternatively abdomen MRI without and with contrast could be performed for further characterization.   Electronically Signed   By: Earle Gell M.D.   On: 01/28/2014 00:24    Review of Systems  Constitutional: Negative for fever and chills.  Respiratory: Negative for cough and sputum production.   Cardiovascular: Negative for chest pain.  Gastrointestinal: Positive for nausea and abdominal pain. Negative for vomiting.  Genitourinary: Negative for dysuria, urgency and frequency.  Neurological: Negative for headaches.    Blood pressure 168/68, pulse 85, temperature 98.2 F (36.8 C), temperature source Oral, resp. rate 18, height 5' 4"  (1.626 m), weight 89 lb (40.37 kg), SpO2  87.00%. Physical Exam  Constitutional: She is oriented to person, place, and time. She appears well-developed and well-nourished.  HENT:  Head: Normocephalic and atraumatic.  Eyes: Conjunctivae are normal. Pupils are equal, round, and reactive to light.  Neck: Normal range of motion.  Cardiovascular: Normal rate and regular rhythm.   Respiratory: Effort normal and breath sounds normal. No respiratory distress. She has no wheezes.  GI: She exhibits no distension. There is no tenderness.  Musculoskeletal: Normal range of motion.  Neurological: She is alert and oriented to person, place, and time.  Skin: Skin is warm and dry.   NG with bilious output  Assessment/Plan 73 y.o. F with SBO, transferred from [facility].  NG tube in place.  Abd benign on exam.  Of note this am, new elevation in wbc.  Pt discussed with Dr Johney Maine who will assume management.      Rolin Schult C. 11/23/9824, 4:15 AM

## 2014-01-29 ENCOUNTER — Inpatient Hospital Stay (HOSPITAL_COMMUNITY): Payer: Medicare Other

## 2014-01-29 DIAGNOSIS — E43 Unspecified severe protein-calorie malnutrition: Secondary | ICD-10-CM | POA: Insufficient documentation

## 2014-01-29 LAB — CBC
HCT: 38.6 % (ref 36.0–46.0)
Hemoglobin: 12.7 g/dL (ref 12.0–15.0)
MCH: 30.7 pg (ref 26.0–34.0)
MCHC: 32.9 g/dL (ref 30.0–36.0)
MCV: 93.2 fL (ref 78.0–100.0)
PLATELETS: 288 10*3/uL (ref 150–400)
RBC: 4.14 MIL/uL (ref 3.87–5.11)
RDW: 13.4 % (ref 11.5–15.5)
WBC: 11.7 10*3/uL — AB (ref 4.0–10.5)

## 2014-01-29 LAB — BASIC METABOLIC PANEL
ANION GAP: 9 (ref 5–15)
BUN: 22 mg/dL (ref 6–23)
CHLORIDE: 98 meq/L (ref 96–112)
CO2: 30 mEq/L (ref 19–32)
Calcium: 8.6 mg/dL (ref 8.4–10.5)
Creatinine, Ser: 0.82 mg/dL (ref 0.50–1.10)
GFR calc non Af Amer: 70 mL/min — ABNORMAL LOW (ref >90)
GFR, EST AFRICAN AMERICAN: 81 mL/min — AB (ref >90)
Glucose, Bld: 128 mg/dL — ABNORMAL HIGH (ref 70–99)
POTASSIUM: 4.2 meq/L (ref 3.7–5.3)
SODIUM: 137 meq/L (ref 137–147)

## 2014-01-29 LAB — ABO/RH: ABO/RH(D): O POS

## 2014-01-29 LAB — PROTIME-INR
INR: 1.05 (ref 0.00–1.49)
PROTHROMBIN TIME: 13.7 s (ref 11.6–15.2)

## 2014-01-29 LAB — TYPE AND SCREEN
ABO/RH(D): O POS
Antibody Screen: NEGATIVE

## 2014-01-29 MED ORDER — ENOXAPARIN SODIUM 30 MG/0.3ML ~~LOC~~ SOLN
30.0000 mg | SUBCUTANEOUS | Status: DC
  Administered 2014-01-29 – 2014-01-31 (×3): 30 mg via SUBCUTANEOUS
  Filled 2014-01-29 (×3): qty 0.3

## 2014-01-29 MED ORDER — ALBUTEROL SULFATE (2.5 MG/3ML) 0.083% IN NEBU
2.5000 mg | INHALATION_SOLUTION | Freq: Three times a day (TID) | RESPIRATORY_TRACT | Status: DC
  Administered 2014-01-30 – 2014-01-31 (×4): 2.5 mg via RESPIRATORY_TRACT
  Filled 2014-01-29 (×4): qty 3

## 2014-01-29 NOTE — Progress Notes (Signed)
Subjective: She looks better, some BS, and some flatus, I can't really tell how much she has had from NG 700 in cannister.    Objective: Vital signs in last 24 hours: Temp:  [97.9 F (36.6 C)-99.9 F (37.7 C)] 98.4 F (36.9 C) (07/10 0956) Pulse Rate:  [76-101] 101 (07/10 0956) Resp:  [18] 18 (07/10 0956) BP: (120-145)/(54-78) 144/54 mmHg (07/10 0956) SpO2:  [96 %-100 %] 96 % (07/10 0956) Last BM Date: 01/25/14 100 ml from NG recorded yesterday Afebrile, VSS No labs Intake/Output from previous day: 07/09 0701 - 07/10 0700 In: 2143.8 [I.V.:1793.8; NG/GT:350] Out: 1025 [Urine:925; Emesis/NG output:100] Intake/Output this shift:    General appearance: alert, cooperative, no distress and not feeling as bad as yesterday. Resp: some wheezing both side. GI: soft, she isn't distended, few Bs, and flatus  Lab Results:   Recent Labs  01/28/14 0514 01/29/14 0500  WBC 19.0* 11.7*  HGB 14.8 12.7  HCT 43.5 38.6  PLT 299 288    BMET  Recent Labs  01/28/14 0514 01/29/14 0500  NA 135* 137  K 4.4 4.2  CL 91* 98  CO2 30 30  GLUCOSE 150* 128*  BUN 18 22  CREATININE 0.86 0.82  CALCIUM 9.7 8.6   PT/INR  Recent Labs  01/29/14 0500  LABPROT 13.7  INR 1.05     Recent Labs Lab 01/27/14 2136  AST 29  ALT 19  ALKPHOS 73  BILITOT 0.4  PROT 6.6  ALBUMIN 4.1     Lipase     Component Value Date/Time   LIPASE 30 01/27/2014 2136     Studies/Results: Ct Abdomen Pelvis W Contrast  01/28/2014   CLINICAL DATA:  Abdominal pain.  Nausea and vomiting.  EXAM: CT ABDOMEN AND PELVIS WITH CONTRAST  TECHNIQUE: Multidetector CT imaging of the abdomen and pelvis was performed using the standard protocol following bolus administration of intravenous contrast.  CONTRAST:  68mL OMNIPAQUE IOHEXOL 300 MG/ML  SOLN  COMPARISON:  08/15/2013  FINDINGS: Stomach and multiple small bowel loops are dilated, with transition point seen in the right lower quadrant. There is a swirling pattern of  the small bowel mesentery at the transition best seen on coronal MPRs, and this is suspicious for an adhesion or internal hernia. No evidence of bowel wall thickening or pneumatosis. Colonic diverticulosis is seen, without evidence of diverticulitis. No focal inflammatory process or abscess identified. No evidence of free fluid. Small to moderate hiatal hernia noted.  Several hypervascular and hypovascular lesions are again seen in both right and left hepatic lobes. These remains stable and are likely benign. No new or enlarging liver masses are identified. Surgical clips seen from prior cholecystectomy. The pancreas, spleen, adrenal glands, and kidneys are normal in appearance. No evidence of hydronephrosis. No lymphadenopathy identified.  IMPRESSION: Distal small bowel obstruction, with transition point in the right lower quadrant, likely due to adhesion or internal hernia.  Diverticulosis. No radiographic evidence of diverticulitis.  Indeterminate liver lesions remain stable, and are therefore likely benign. Recommend continued followup by CT in 6 months, or alternatively abdomen MRI without and with contrast could be performed for further characterization.   Electronically Signed   By: Earle Gell M.D.   On: 01/28/2014 00:24   Dg Abd 2 Views  01/29/2014   CLINICAL DATA:  Small bowel obstruction.  EXAM: ABDOMEN - 2 VIEW  COMPARISON:  CT scan 01/27/2014.  FINDINGS: The NG tube is in the antral region of the stomach. The bowel gas pattern is  improved with less distended small bowel loops. There are persistent scattered air-fluid levels but there is a more air in the colon. No free air. The soft tissue shadows are maintained. The lung bases are clear.  IMPRESSION: NG tube in good position.  Resolving/improving small bowel obstruction bowel gas pattern.   Electronically Signed   By: Kalman Jewels M.D.   On: 01/29/2014 09:49    Medications: . amLODipine  10 mg Oral Daily  . antiseptic oral rinse  15 mL Mouth  Rinse q12n4p  . benazepril  20 mg Oral Daily  . chlorhexidine  15 mL Mouth Rinse BID  . docusate sodium  100 mg Oral BID  . enoxaparin (LOVENOX) injection  30 mg Subcutaneous Q24H  . fluticasone  2 puff Inhalation BID  . lip balm  1 application Topical BID  . pantoprazole  40 mg Oral Daily  . tiotropium  18 mcg Inhalation Daily    Assessment/Plan Recurrent SBO  Hx of cholecystectomy 08/18/13, abdominal hysterectomy 1987, SBO with surgery in [place], 12/2012.  COPD on home O2 at night/still smoking at home.  Hx of DVT - on lovenox  Hiatal hernia   Plan:  I told the nurse to walk her even if she is tired.  Continue NG for now.  She may not need it, but will leave it for now.  Hopefully we can get her moving and get her on a diet tomorrow.   LOS: 2 days    Keven Soucy 01/29/2014

## 2014-01-29 NOTE — Progress Notes (Signed)
Clinically feeling better but obstruction the Foley result.  Continue nasogastric decompression for now.  Hopefully can do clamping or possible removal tomorrow if continues to improve.  Try to get her to mobilize more.

## 2014-01-30 NOTE — Progress Notes (Signed)
Patient ID: Tamara Adams, female   DOB: 23-Oct-1940, 73 y.o.   MRN: [id number]    Subjective: Continues to feel better. Has had a lot of flatus. No bowel movements. Denies abdominal pain. Has nasal congestion and sore throat apparently from NG tube.  Objective: Vital signs in last 24 hours: Temp:  [98.8 F (37.1 C)-99.5 F (37.5 C)] 98.8 F (37.1 C) (07/11 0530) Pulse Rate:  [92-101] 98 (07/11 0530) Resp:  [16-18] 18 (07/11 0530) BP: (136-151)/(62-66) 136/62 mmHg (07/11 0530) SpO2:  [96 %-98 %] 96 % (07/11 0806) Last BM Date: 01/25/14  Intake/Output from previous day: 02/19/23 0701 - 07/11 0700 In: 900 [I.V.:900] Out: 950 [Urine:950] Intake/Output this shift:    General appearance: alert, cooperative and no distress GI: normal findings: soft, non-tender and nondistended  Lab Results:   Recent Labs  01/28/14 0514 2014/02/18 0500  WBC 19.0* 11.7*  HGB 14.8 12.7  HCT 43.5 38.6  PLT 299 288   BMET  Recent Labs  01/28/14 0514 02-18-14 0500  NA 135* 137  K 4.4 4.2  CL 91* 98  CO2 30 30  GLUCOSE 150* 128*  BUN 18 22  CREATININE 0.86 0.82  CALCIUM 9.7 8.6     Studies/Results: Dg Abd 2 Views  18-Feb-2014   CLINICAL DATA:  Small bowel obstruction.  EXAM: ABDOMEN - 2 VIEW  COMPARISON:  CT scan 01/27/2014.  FINDINGS: The NG tube is in the antral region of the stomach. The bowel gas pattern is improved with less distended small bowel loops. There are persistent scattered air-fluid levels but there is a more air in the colon. No free air. The soft tissue shadows are maintained. The lung bases are clear.  IMPRESSION: NG tube in good position.  Resolving/improving small bowel obstruction bowel gas pattern.   Electronically Signed   By: Kalman Jewels M.D.   On: 2014/02/18 09:49    Anti-infectives: Anti-infectives   None      Assessment/Plan: Small bowel obstruction that clinically is resolving. We'll discontinue NG tube and start clear liquid diet. Repeat CBC in a.m. As  white count was mildly elevated yesterday     LOS: 3 days    [name] T 01/30/2014

## 2014-01-30 NOTE — Progress Notes (Signed)
Pt vitals took off continuous pulse ox. Sats 96%, HR 96, RR 20.

## 2014-01-31 LAB — CBC
HEMATOCRIT: 32.5 % — AB (ref 36.0–46.0)
Hemoglobin: 10.7 g/dL — ABNORMAL LOW (ref 12.0–15.0)
MCH: 30.5 pg (ref 26.0–34.0)
MCHC: 32.9 g/dL (ref 30.0–36.0)
MCV: 92.6 fL (ref 78.0–100.0)
Platelets: 216 10*3/uL (ref 150–400)
RBC: 3.51 MIL/uL — ABNORMAL LOW (ref 3.87–5.11)
RDW: 12.6 % (ref 11.5–15.5)
WBC: 6.8 10*3/uL (ref 4.0–10.5)

## 2014-01-31 NOTE — Discharge Summary (Signed)
Physician Discharge Summary  Patient ID:  Tamara Adams  MRN: [id number]  DOB/AGE: 11-07-1940 73 y.o.  Admit date: 01/27/2014 Discharge date: 01/31/2014  Discharge Diagnoses:  1. SBO - resolved  Last surgery in June 2014 in [place] for SBO.  2. HTN  3. COPD   On O2 at night  4. Diverticulosis  5. Probable benign liver lesions seen on admission CT scan  Needs 6 month follow up   CT report given to patient.  Operation:  on * No surgery found *  Discharged Condition: good  Hospital Course: Tamara Adams is an 73 y.o. female whose primary care physician is METHENEY,CATHERINE, MD and who was admitted 01/27/2014 with a chief complaint of  Chief Complaint  Patient presents with  . Abdominal Pain   She was admitted with the diagnosis of a small bowel obstruction.   She had an NGT placed.  She started passing gas.  Her NGT was removed.  She is tolerating clear liquids and ready to go home. I also reviewed with her her CT scan and the liver lesions.  She already know about this.  The discharge instructions were reviewed with the patient.  Consults: None  Significant Diagnostic Studies: Results for orders placed during the hospital encounter of 01/27/14  URINALYSIS, ROUTINE W REFLEX MICROSCOPIC      Result Value Ref Range   Color, Urine YELLOW  YELLOW   APPearance CLEAR  CLEAR   Specific Gravity, Urine 1.021  1.005 - 1.030   pH 6.5  5.0 - 8.0   Glucose, UA NEGATIVE  NEGATIVE mg/dL   Hgb urine dipstick LARGE (*) NEGATIVE   Bilirubin Urine NEGATIVE  NEGATIVE   Ketones, ur 15 (*) NEGATIVE mg/dL   Protein, ur 100 (*) NEGATIVE mg/dL   Urobilinogen, UA 1.0  0.0 - 1.0 mg/dL   Nitrite NEGATIVE  NEGATIVE   Leukocytes, UA SMALL (*) NEGATIVE  URINE MICROSCOPIC-ADD ON      Result Value Ref Range   Squamous Epithelial / LPF RARE  RARE   WBC, UA 3-6  <3 WBC/hpf   RBC / HPF 11-20  <3 RBC/hpf   Bacteria, UA RARE  RARE  CBC WITH DIFFERENTIAL      Result Value Ref Range   WBC 7.8  4.0 -  10.5 K/uL   RBC 4.39  3.87 - 5.11 MIL/uL   Hemoglobin 13.8  12.0 - 15.0 g/dL   HCT 39.7  36.0 - 46.0 %   MCV 90.4  78.0 - 100.0 fL   MCH 31.4  26.0 - 34.0 pg   MCHC 34.8  30.0 - 36.0 g/dL   RDW 12.4  11.5 - 15.5 %   Platelets 305  150 - 400 K/uL   Neutrophils Relative % 69  43 - 77 %   Neutro Abs 5.3  1.7 - 7.7 K/uL   Lymphocytes Relative 20  12 - 46 %   Lymphs Abs 1.6  0.7 - 4.0 K/uL   Monocytes Relative 9  3 - 12 %   Monocytes Absolute 0.7  0.1 - 1.0 K/uL   Eosinophils Relative 1  0 - 5 %   Eosinophils Absolute 0.1  0.0 - 0.7 K/uL   Basophils Relative 1  0 - 1 %   Basophils Absolute 0.1  0.0 - 0.1 K/uL  COMPREHENSIVE METABOLIC PANEL      Result Value Ref Range   Sodium 139  137 - 147 mEq/L   Potassium 4.0  3.7 - 5.3 mEq/L  Chloride 95 (*) 96 - 112 mEq/L   CO2 30  19 - 32 mEq/L   Glucose, Bld 124 (*) 70 - 99 mg/dL   BUN 12  6 - 23 mg/dL   Creatinine, Ser 0.70  0.50 - 1.10 mg/dL   Calcium 10.0  8.4 - 10.5 mg/dL   Total Protein 6.6  6.0 - 8.3 g/dL   Albumin 4.1  3.5 - 5.2 g/dL   AST 29  0 - 37 U/L   ALT 19  0 - 35 U/L   Alkaline Phosphatase 73  39 - 117 U/L   Total Bilirubin 0.4  0.3 - 1.2 mg/dL   GFR calc non Af Amer 85 (*) >90 mL/min   GFR calc Af Amer >90  >90 mL/min   Anion gap 14  5 - 15  LIPASE, BLOOD      Result Value Ref Range   Lipase 30  11 - 59 U/L  BASIC METABOLIC PANEL      Result Value Ref Range   Sodium 135 (*) 137 - 147 mEq/L   Potassium 4.4  3.7 - 5.3 mEq/L   Chloride 91 (*) 96 - 112 mEq/L   CO2 30  19 - 32 mEq/L   Glucose, Bld 150 (*) 70 - 99 mg/dL   BUN 18  6 - 23 mg/dL   Creatinine, Ser 0.86  0.50 - 1.10 mg/dL   Calcium 9.7  8.4 - 10.5 mg/dL   GFR calc non Af Amer 66 (*) >90 mL/min   GFR calc Af Amer 76 (*) >90 mL/min   Anion gap 14  5 - 15  CBC      Result Value Ref Range   WBC 19.0 (*) 4.0 - 10.5 K/uL   RBC 4.72  3.87 - 5.11 MIL/uL   Hemoglobin 14.8  12.0 - 15.0 g/dL   HCT 43.5  36.0 - 46.0 %   MCV 92.2  78.0 - 100.0 fL   MCH 31.4   26.0 - 34.0 pg   MCHC 34.0  30.0 - 36.0 g/dL   RDW 13.0  11.5 - 15.5 %   Platelets 299  150 - 400 K/uL  APTT      Result Value Ref Range   aPTT 38 (*) 24 - 37 seconds  PREALBUMIN      Result Value Ref Range   Prealbumin 21.6  17.0 - 34.0 mg/dL  CBC      Result Value Ref Range   WBC 11.7 (*) 4.0 - 10.5 K/uL   RBC 4.14  3.87 - 5.11 MIL/uL   Hemoglobin 12.7  12.0 - 15.0 g/dL   HCT 38.6  36.0 - 46.0 %   MCV 93.2  78.0 - 100.0 fL   MCH 30.7  26.0 - 34.0 pg   MCHC 32.9  30.0 - 36.0 g/dL   RDW 13.4  11.5 - 15.5 %   Platelets 288  150 - 400 K/uL  BASIC METABOLIC PANEL      Result Value Ref Range   Sodium 137  137 - 147 mEq/L   Potassium 4.2  3.7 - 5.3 mEq/L   Chloride 98  96 - 112 mEq/L   CO2 30  19 - 32 mEq/L   Glucose, Bld 128 (*) 70 - 99 mg/dL   BUN 22  6 - 23 mg/dL   Creatinine, Ser 0.82  0.50 - 1.10 mg/dL   Calcium 8.6  8.4 - 10.5 mg/dL   GFR calc non Af Amer 70 (*) >  90 mL/min   GFR calc Af Amer 81 (*) >90 mL/min   Anion gap 9  5 - 15  PROTIME-INR      Result Value Ref Range   Prothrombin Time 13.7  11.6 - 15.2 seconds   INR 1.05  0.00 - 1.49  CBC      Result Value Ref Range   WBC 6.8  4.0 - 10.5 K/uL   RBC 3.51 (*) 3.87 - 5.11 MIL/uL   Hemoglobin 10.7 (*) 12.0 - 15.0 g/dL   HCT 32.5 (*) 36.0 - 46.0 %   MCV 92.6  78.0 - 100.0 fL   MCH 30.5  26.0 - 34.0 pg   MCHC 32.9  30.0 - 36.0 g/dL   RDW 12.6  11.5 - 15.5 %   Platelets 216  150 - 400 K/uL  I-STAT CG4 LACTIC ACID, ED      Result Value Ref Range   Lactic Acid, Venous 0.94  0.5 - 2.2 mmol/L  TYPE AND SCREEN      Result Value Ref Range   ABO/RH(D) O POS     Antibody Screen NEG     Sample Expiration 02/01/2014    ABO/RH      Result Value Ref Range   ABO/RH(D) O POS      Ct Abdomen Pelvis W Contrast  01/28/2014   CLINICAL DATA:  Abdominal pain.  Nausea and vomiting.  EXAM: CT ABDOMEN AND PELVIS WITH CONTRAST  TECHNIQUE: Multidetector CT imaging of the abdomen and pelvis was performed using the standard protocol  following bolus administration of intravenous contrast.  CONTRAST:  8mL OMNIPAQUE IOHEXOL 300 MG/ML  SOLN  COMPARISON:  08/15/2013  FINDINGS: Stomach and multiple small bowel loops are dilated, with transition point seen in the right lower quadrant. There is a swirling pattern of the small bowel mesentery at the transition best seen on coronal MPRs, and this is suspicious for an adhesion or internal hernia. No evidence of bowel wall thickening or pneumatosis. Colonic diverticulosis is seen, without evidence of diverticulitis. No focal inflammatory process or abscess identified. No evidence of free fluid. Small to moderate hiatal hernia noted.  Several hypervascular and hypovascular lesions are again seen in both right and left hepatic lobes. These remains stable and are likely benign. No new or enlarging liver masses are identified. Surgical clips seen from prior cholecystectomy. The pancreas, spleen, adrenal glands, and kidneys are normal in appearance. No evidence of hydronephrosis. No lymphadenopathy identified.  IMPRESSION: Distal small bowel obstruction, with transition point in the right lower quadrant, likely due to adhesion or internal hernia.  Diverticulosis. No radiographic evidence of diverticulitis.  Indeterminate liver lesions remain stable, and are therefore likely benign. Recommend continued followup by CT in 6 months, or alternatively abdomen MRI without and with contrast could be performed for further characterization.   Electronically Signed   By: Earle Gell M.D.   On: 01/28/2014 00:24   Dg Abd 2 Views  01/29/2014   CLINICAL DATA:  Small bowel obstruction.  EXAM: ABDOMEN - 2 VIEW  COMPARISON:  CT scan 01/27/2014.  FINDINGS: The NG tube is in the antral region of the stomach. The bowel gas pattern is improved with less distended small bowel loops. There are persistent scattered air-fluid levels but there is a more air in the colon. No free air. The soft tissue shadows are maintained. The lung  bases are clear.  IMPRESSION: NG tube in good position.  Resolving/improving small bowel obstruction bowel gas pattern.   Electronically  Signed   By: Kalman Jewels M.D.   On: 01/29/2014 09:49    Discharge Exam:  Filed Vitals:   01/31/14 0530  BP: 116/60  Pulse: 82  Temp: 98.3 F (36.8 C)  Resp: 16   General: Thin older WF who is alert and oriented.  HEENT: Normal. Pupils equal.  Lungs: Clear.  Abdomen: Soft. Lower abdominal scar. Thin.  Discharge Medications:     Medication List         albuterol 108 (90 BASE) MCG/ACT inhaler  Commonly known as:  VENTOLIN HFA  Inhale 2 puffs into the lungs every 6 (six) hours as needed. 2-4 puffs inhaled every 4-6 hours     albuterol (2.5 MG/3ML) 0.083% nebulizer solution  Commonly known as:  PROVENTIL  Take 3 mLs (2.5 mg total) by nebulization every 6 (six) hours as needed for wheezing or shortness of breath.     alendronate 70 MG tablet  Commonly known as:  FOSAMAX  Take 1 tablet (70 mg total) by mouth once a week. Take with a full glass of water on an empty stomach.     amLODipine-benazepril 10-20 MG per capsule  Commonly known as:  LOTREL  Take 1 capsule by mouth daily.     beclomethasone 80 MCG/ACT inhaler  Commonly known as:  QVAR  Inhale 2 puffs into the lungs 2 (two) times daily.     LORazepam 1 MG tablet  Commonly known as:  ATIVAN  Take 1 tablet (1 mg total) by mouth daily as needed for anxiety or sleep.     multivitamin with minerals Tabs tablet  Take 1 tablet by mouth daily.     omeprazole 40 MG capsule  Commonly known as:  PRILOSEC  Take 40 mg by mouth daily.     ondansetron 4 MG disintegrating tablet  Commonly known as:  ZOFRAN-ODT  Take 1 tablet (4 mg total) by mouth every 8 (eight) hours as needed for nausea.     pravastatin 40 MG tablet  Commonly known as:  PRAVACHOL  Take 1 tablet (40 mg total) by mouth at bedtime.     tiotropium 18 MCG inhalation capsule  Commonly known as:  SPIRIVA HANDIHALER  Use  2 inhalations from 1 capsule only once daily        Disposition: 01-Home or Self Care      Discharge Instructions   Diet - low sodium heart healthy    Complete by:  As directed      Increase activity slowly    Complete by:  As directed           Activity:  Driving - May drive   Lifting - No limit  Diet:  As tolerated.  I would ear light for a couple of days.  Follow up appointment:  There is no reason for a follow up appointment, unless there are further problems.   [facility] [phone]  Medications and dosages:  Resume your home medications.  Call Dr. Marcello Moores or her office, [facility]  [phone]) if you have:  Persistent nausea and vomiting,  Severe uncontrolled pain,  Any other questions or concerns you may have after discharge.  Signed: Alphonsa Overall, M.D., [facility] Office:  [phone]  01/31/2014, 8:31 AM

## 2014-01-31 NOTE — Progress Notes (Signed)
General Surgery Note  LOS: 4 days  POD -     Assessment/Plan: 1.  SBO  Last surgery in June 2014 in [place] for SBO.  Passing flatus, taking liquids, ready to go home.  2.  HTN 3.  COPD  On O2 at night 4.  Diverticulosis 5.  Probable benign liver lesions  Needs 6 month follow up  CT report given to patient. 6.  DVT prophylaxis - on Lovenox  Active Problems:   SBO (small bowel obstruction)   Small bowel obstruction   Protein-calorie malnutrition, severe  Subjective:  Feels okay. Taking liquids.  Passing a lot of gas, but no BM.  She said that she will not have a BM until she gets home.  She is ready to go home. Objective:   Filed Vitals:   01/31/14 0530  BP: 116/60  Pulse: 82  Temp: 98.3 F (36.8 C)  Resp: 16     Intake/Output from previous day:  07/11 0701 - 07/12 0700 In: 2800 [P.O.:1000; I.V.:1800] Out: 2800 [Urine:2800]  Intake/Output this shift:      Physical Exam:   General: Thin older WF who is alert and oriented.    HEENT: Normal. Pupils equal. .   Lungs: Clear.   Abdomen: Soft.  Lower abdominal scar.  Thin.   Lab Results:    Recent Labs  01/29/14 0500 01/31/14 0610  WBC 11.7* 6.8  HGB 12.7 10.7*  HCT 38.6 32.5*  PLT 288 216    BMET   Recent Labs  01/29/14 0500  NA 137  K 4.2  CL 98  CO2 30  GLUCOSE 128*  BUN 22  CREATININE 0.82  CALCIUM 8.6    PT/INR   Recent Labs  01/29/14 0500  LABPROT 13.7  INR 1.05    ABG  No results found for this basename: PHART, PCO2, PO2, HCO3,  in the last 72 hours   Studies/Results:  Dg Abd 2 Views  01/29/2014   CLINICAL DATA:  Small bowel obstruction.  EXAM: ABDOMEN - 2 VIEW  COMPARISON:  CT scan 01/27/2014.  FINDINGS: The NG tube is in the antral region of the stomach. The bowel gas pattern is improved with less distended small bowel loops. There are persistent scattered air-fluid levels but there is a more air in the colon. No free air. The soft tissue shadows are maintained. The lung  bases are clear.  IMPRESSION: NG tube in good position.  Resolving/improving small bowel obstruction bowel gas pattern.   Electronically Signed   By: Kalman Jewels M.D.   On: 01/29/2014 09:49     Anti-infectives:   Anti-infectives   None      Alphonsa Overall, MD, FACS Pager: [phone] Surgery Office: [phone] 01/31/2014

## 2014-01-31 NOTE — Discharge Instructions (Signed)
[facility] - DISCHARGE INSTRUCTIONS TO PATIENT  Activity:  Driving - May drive   Lifting - No limit  Diet:  As tolerated.  I would ear light for a couple of days.  Follow up appointment:  There is no reason for a follow up appointment, unless there are further problems.   [facility] [phone]  Medications and dosages:  Resume your home medications.  Call Dr. Marcello Moores or her office, [facility]  [phone]) if you have:  Persistent nausea and vomiting,  Severe uncontrolled pain,  Any other questions or concerns you may have after discharge.  In an emergency, call 911 or go to an Emergency Department at a nearby hospital.

## 2014-02-01 ENCOUNTER — Telehealth: Payer: Self-pay

## 2014-02-01 ENCOUNTER — Emergency Department (INDEPENDENT_AMBULATORY_CARE_PROVIDER_SITE_OTHER)
Admission: EM | Admit: 2014-02-01 | Discharge: 2014-02-01 | Disposition: A | Discharge status: Home / Self Care | Payer: Medicare Other | Source: Home / Self Care | Attending: Family Medicine | Admitting: Family Medicine

## 2014-02-01 ENCOUNTER — Encounter: Payer: Self-pay | Admitting: Emergency Medicine

## 2014-02-01 ENCOUNTER — Emergency Department (INDEPENDENT_AMBULATORY_CARE_PROVIDER_SITE_OTHER): Payer: Medicare Other

## 2014-02-01 DIAGNOSIS — Y95 Nosocomial condition: Principal | ICD-10-CM

## 2014-02-01 DIAGNOSIS — J449 Chronic obstructive pulmonary disease, unspecified: Secondary | ICD-10-CM | POA: Diagnosis not present

## 2014-02-01 DIAGNOSIS — J189 Pneumonia, unspecified organism: Secondary | ICD-10-CM

## 2014-02-01 LAB — POCT CBC W AUTO DIFF (K'VILLE URGENT CARE)

## 2014-02-01 MED ORDER — PREDNISONE 20 MG PO TABS: 20.0000 mg | ORAL_TABLET | Freq: Two times a day (BID) | ORAL | Status: DC

## 2014-02-01 MED ORDER — LEVOFLOXACIN 500 MG PO TABS: 500.0000 mg | ORAL_TABLET | Freq: Every day | ORAL | Status: DC

## 2014-02-01 NOTE — ED Provider Notes (Signed)
CSN: [id number]     Arrival date & time 02/01/14  1228 History   First MD Initiated Contact with Patient 02/01/14 1252     Chief Complaint  Patient presents with  . Nasal Congestion  . Wheezing  . Cough  . Fatigue  . Dizziness  . Headache      HPI Comments: Patient was discharged from hospital yesterday after treatment for bowel obstruction.  While in the hospital she developed a nonproductive cough four days ago.  Yesterday she developed fever to 100.3.  She has had wheezing and shortness of breath, improved with albuterol. Review of chart notes reveal that patient's WBC on admission was 19, and on July 8 her WBC had decreased to 7.8. She had pneumonia last year, and has had multiple episodes of bronchitis.  The history is provided by the patient and a relative.    Past Medical History  Diagnosis Date  . COPD (chronic obstructive pulmonary disease)     FVC 72%, FEV1 29%, FEV1 ratio 32% (very severe (COPD)  . Hypertension   . Pneumonia   . DVT (deep venous thrombosis)     "years ago after a surgery"  . Pinched nerve     in back  . Peptic ulcer   . Colon polyp     removed, benign  . H/O hiatal hernia   . Degenerative disc disease   . Anemia     as a child  . Kidney stone    Past Surgical History  Procedure Laterality Date  . Abdominal hysterectomy  1987    and one ovary  . Oophorectomy  1992  . Cataract extraction w/phaco  06/12/2012    Dr. Boykin Reaper, left eye  . Cataract extraction  06/22/2012    Right eye, Dr. Lester Kinsman.   Marland Kitchen Urethra surgery    . Small intestine surgery      for a blockage, no bowel was removed, just kinked from scar tissue  . Cholecystectomy N/A 08/18/2013    Procedure: LAPAROSCOPIC CHOLECYSTECTOMY;  Surgeon: Harl Bowie, MD;  Location: [facility] OR;  Service: General;  Laterality: N/A;   Family History  Problem Relation Age of Onset  . Hypertension Mother   . Ovarian cancer Mother    History  Substance Use Topics  . Smoking status:  Current Every Day Smoker -- 0.25 packs/day    Types: Cigarettes  . Smokeless tobacco: Never Used     Comment: Using Electronic cigarettes  . Alcohol Use: No   OB History   Grav Para Term Preterm Abortions TAB SAB Ect Mult Living                 Review of Systems No sore throat + hoarseness + cough No pleuritic pain + wheezing + nasal congestion + post-nasal drainage No sinus pain/pressure No itchy/red eyes No earache No hemoptysis + SOB + fever, + chills + nausea No vomiting + abdominal pain No diarrhea No urinary symptoms No skin rash + fatigue No myalgias + headache    Allergies  Maxidex and Tylox  Home Medications   Prior to Admission medications   Medication Sig Start Date End Date Taking? Authorizing Provider  albuterol (PROVENTIL) (2.5 MG/3ML) 0.083% nebulizer solution Take 3 mLs (2.5 mg total) by nebulization every 6 (six) hours as needed for wheezing or shortness of breath. 10/22/13   [name], MD  albuterol (VENTOLIN HFA) 108 (90 BASE) MCG/ACT inhaler Inhale 2 puffs into the lungs every 6 (six) hours as  needed. 2-4 puffs inhaled every 4-6 hours 10/22/13   [name], MD  alendronate (FOSAMAX) 70 MG tablet Take 1 tablet (70 mg total) by mouth once a week. Take with a full glass of water on an empty stomach. 06/24/13   [name], MD  amLODipine-benazepril (LOTREL) 10-20 MG per capsule Take 1 capsule by mouth daily.    Historical Provider, MD  beclomethasone (QVAR) 80 MCG/ACT inhaler Inhale 2 puffs into the lungs 2 (two) times daily. 10/22/13   [name], MD  levofloxacin (LEVAQUIN) 500 MG tablet Take 1 tablet (500 mg total) by mouth daily. 02/01/14   Kandra Nicolas, MD  LORazepam (ATIVAN) 1 MG tablet Take 1 tablet (1 mg total) by mouth daily as needed for anxiety or sleep. 01/21/14   [name], MD  Multiple Vitamin (MULTIVITAMIN WITH MINERALS) TABS tablet Take 1 tablet by mouth daily.    Historical Provider, MD   omeprazole (PRILOSEC) 40 MG capsule Take 40 mg by mouth daily.    Historical Provider, MD  ondansetron (ZOFRAN-ODT) 4 MG disintegrating tablet Take 1 tablet (4 mg total) by mouth every 8 (eight) hours as needed for nausea. 10/16/13   [name], MD  pravastatin (PRAVACHOL) 40 MG tablet Take 1 tablet (40 mg total) by mouth at bedtime. 12/30/12   [name], MD  predniSONE (DELTASONE) 20 MG tablet Take 1 tablet (20 mg total) by mouth 2 (two) times daily. Take with food. 02/01/14   Kandra Nicolas, MD  tiotropium (SPIRIVA HANDIHALER) 18 MCG inhalation capsule Use 2 inhalations from 1 capsule only once daily 10/22/13   [name], MD   BP 111/65  Pulse 100  Temp(Src) 97.8 F (36.6 C) (Oral)  Resp 16  Ht 5\' 4"  (1.626 m)  Wt 87 lb (39.463 kg)  BMI 14.93 kg/m2  SpO2 93% Physical Exam Nursing notes and Vital Signs reviewed. Appearance:  Patient appears older than stated age, and in no acute distress.  She is alert and oriented.  Eyes:  Pupils are equal, round, and reactive to light and accomodation.  Extraocular movement is intact.  Conjunctivae are not inflamed  Ears:  Canals normal.  Tympanic membranes normal.  Nose:  Mildly congested turbinates.  No sinus tenderness.    Pharynx:  Normal; moist mucous membranes  Neck:  Supple.  No adenopathy Lungs:   Decreased breath sounds.  Wheezes and rhonchi heard posteriorly.  Breath sounds are equal.  Heart:  Regular rate and rhythm without murmurs, rubs, or gallops.  Abdomen:  Nontender without masses or hepatosplenomegaly.  Bowel sounds are present.  No CVA or flank tenderness.  Extremities:  No edema.  No calf tenderness Skin:  No rash present.   ED Course  Procedures  none    Labs Reviewed  POCT CBC W AUTO DIFF ([facility]):  WBC 11.6; LY 8.4; MO 6.4; GR 85.3; Hgb 12.7; Platelets 309     Imaging Review Dg Chest 2 View  02/01/2014   CLINICAL DATA:  Cough and wheezing and nasal congestion.  EXAM: CHEST  2  VIEW  COMPARISON:  08/14/2013 and 07/27/2013 and 10/09/2012  FINDINGS: There are small patchy areas of infiltrate seen anteriorly, probably in the left upper lobe best seen on the lateral view. This is superimposed on severe chronic obstructive lung disease. The lungs are hyperinflated with flattening of the diaphragm. Heart size and vascularity are normal. No acute osseous abnormality.  IMPRESSION: Small new patchy infiltrates in the  anterior aspect of the left upper lobe superimposed on severe COPD.   Electronically Signed   By: Rozetta Nunnery M.D.   On: 02/01/2014 14:08     MDM   1. HAP (hospital-acquired pneumonia)    Begin Levaquin and prednisone burst. Take plain Mucinex (600 mg guaifenesin) twice daily for cough and congestion.  Increase fluid intake, rest.  Check temperature daily. Stop all antihistamines for now, and other non-prescription cough/cold preparations. Continue all inhalers. If symptoms become significantly worse during the night or over the weekend, proceed to the local emergency room.  Followup with PCP in 3 days.    Kandra Nicolas, MD 02/04/14 2256

## 2014-02-01 NOTE — Telephone Encounter (Signed)
Mrs. Finazzo called and stated that she feels like she has pneumonia or bronchitis and would like to get in to see Dr. Madilyn Fireman. When I called Mrs. Venters back her husband answered and he stated that she went to Urgent Care and I stated that I was going to suggest that to her because Dr. Madilyn Fireman doesn't have any openings and I believe that she really needs to be seen so I was glad that she went to Urgent Care./Robbi Spells,CMA

## 2014-02-01 NOTE — Discharge Instructions (Signed)
Take plain Mucinex (600 mg guaifenesin) twice daily for cough and congestion.  Increase fluid intake, rest.  Check temperature daily. Stop all antihistamines for now, and other non-prescription cough/cold preparations. Continue all inhalers. If symptoms become significantly worse during the night or over the weekend, proceed to the local emergency room.

## 2014-02-01 NOTE — ED Notes (Signed)
Reports getting discharged from hospital yesterday; was there x 4 days for bowel blockage and had NG tube. Last evening began low grade temp and noticed some congestion, cough, headache and dizziness. Has lost 2 pounds, is fatigued.

## 2014-02-04 ENCOUNTER — Encounter: Payer: Self-pay | Admitting: Physician Assistant

## 2014-02-04 ENCOUNTER — Ambulatory Visit (INDEPENDENT_AMBULATORY_CARE_PROVIDER_SITE_OTHER): Payer: Medicare Other | Admitting: Physician Assistant

## 2014-02-04 VITALS — BP 155/77 | HR 102 | Temp 99.4°F | Resp 18 | Ht 64.0 in | Wt 87.0 lb

## 2014-02-04 DIAGNOSIS — J449 Chronic obstructive pulmonary disease, unspecified: Secondary | ICD-10-CM

## 2014-02-04 DIAGNOSIS — K56609 Unspecified intestinal obstruction, unspecified as to partial versus complete obstruction: Secondary | ICD-10-CM | POA: Diagnosis not present

## 2014-02-04 DIAGNOSIS — R932 Abnormal findings on diagnostic imaging of liver and biliary tract: Secondary | ICD-10-CM | POA: Diagnosis not present

## 2014-02-04 DIAGNOSIS — J4489 Other specified chronic obstructive pulmonary disease: Secondary | ICD-10-CM

## 2014-02-04 DIAGNOSIS — J189 Pneumonia, unspecified organism: Secondary | ICD-10-CM

## 2014-02-04 DIAGNOSIS — Y95 Nosocomial condition: Principal | ICD-10-CM

## 2014-02-04 MED ORDER — PREDNISONE 20 MG PO TABS: 20.0000 mg | ORAL_TABLET | Freq: Two times a day (BID) | ORAL | Status: DC

## 2014-02-04 MED ORDER — METHYLPREDNISOLONE SODIUM SUCC 125 MG IJ SOLR
125.0000 mg | Freq: Once | INTRAMUSCULAR | Status: AC
  Administered 2014-02-04: 125 mg via INTRAMUSCULAR

## 2014-02-04 NOTE — Patient Instructions (Signed)
Stay on nebulizer 4 times a day while improving.  Stay on levaquin and prednisone until finished.  Follow up on Monday.

## 2014-02-05 ENCOUNTER — Encounter: Payer: Self-pay | Admitting: Physician Assistant

## 2014-02-05 DIAGNOSIS — K769 Liver disease, unspecified: Secondary | ICD-10-CM | POA: Insufficient documentation

## 2014-02-05 NOTE — Progress Notes (Signed)
   Subjective:    Patient ID: Tamara Adams, female    DOB: January 23, 1941, 73 y.o.   MRN: [id number]  HPI Pt comes into clinic today to follow up on small bowel obstruction and HAP. She was seen on 01/27/14 for bowel obstruction. After being discharged she developed wheezing and felt very fatigued. She went to UC and diagnosed with HAP. She was given prednisone, continued on albuterol, and levaquin. It has been 2 1/2 days since started levaquin. She does feel some better but still having significant fatigue, SOB and wheezing. Pt does have COPD. She is managed by qvar and spiriva. Pt continues to smoke everyday.   Brings in CT scan for follow up in 6 months.    Review of Systems  All other systems reviewed and are negative.      Objective:   Physical Exam  Constitutional: She is oriented to person, place, and time. She appears well-developed and well-nourished.  HENT:  Head: Normocephalic and atraumatic.  Eyes: Conjunctivae are normal. Right eye exhibits no discharge. Left eye exhibits no discharge.  Neck: Normal range of motion. Neck supple.  Cardiovascular: Normal rate, regular rhythm and normal heart sounds.   Pulmonary/Chest:  Decreased effort. Bilateral wheezing and rhonchi.   Lymphadenopathy:    She has no cervical adenopathy.  Neurological: She is alert and oriented to person, place, and time.  Skin: Skin is warm.  Psychiatric: She has a normal mood and affect. Her behavior is normal.          Assessment & Plan:  Small bowel obstruction- doing well no problems or concerns.   HAP/COPD- Pulse ox 93 percent, temp 99.4, respirations 18, pulse 102. I used the PSI/PORT system and plugged in pt's information and score was 62 and suggested outpatient management. Pt has nebulizer and will do neb when she gets home. Shot of solumedrol 125mg  given in office today. Prednisone was extended. Follow up on Monday with PCP to make sure improving. Stay on albuterol nebulizer 4 times a day and  levquin. Pt only been on levaquin for 2 days so hopefully she will continue to improve. If worsening please call office. dicusssed importance of smoking cessation.   Abnormal CT scan- Indeterminate liver lesions remain stable and appear benign. Recommend follow up in 6 months.

## 2014-02-08 ENCOUNTER — Encounter: Payer: Self-pay | Admitting: Family Medicine

## 2014-02-08 ENCOUNTER — Ambulatory Visit (INDEPENDENT_AMBULATORY_CARE_PROVIDER_SITE_OTHER): Payer: Medicare Other | Admitting: Family Medicine

## 2014-02-08 VITALS — BP 131/60 | HR 97 | Temp 98.5°F | Ht 64.0 in | Wt 88.0 lb

## 2014-02-08 DIAGNOSIS — J189 Pneumonia, unspecified organism: Secondary | ICD-10-CM

## 2014-02-08 DIAGNOSIS — Y95 Nosocomial condition: Principal | ICD-10-CM

## 2014-02-08 DIAGNOSIS — K56609 Unspecified intestinal obstruction, unspecified as to partial versus complete obstruction: Secondary | ICD-10-CM | POA: Diagnosis not present

## 2014-02-08 DIAGNOSIS — J441 Chronic obstructive pulmonary disease with (acute) exacerbation: Secondary | ICD-10-CM | POA: Diagnosis not present

## 2014-02-08 MED ORDER — PREDNISONE 20 MG PO TABS: ORAL_TABLET | ORAL | Status: DC

## 2014-02-08 NOTE — Progress Notes (Signed)
   Subjective:    Patient ID: Tamara Adams, female    DOB: 1941-01-21, 73 y.o.   MRN: [id number]  HPI She's here today to followup for hospital acquired pneumonia/COPD exacerbation-she was seen in the last week and was in mild respiratory distress. We opted to try to treat her at home. She says today is the first day she actually feels better. She says she doesn't feel short of breath but still just a little bit of tightness in her chest. No pain. She said her last fever was last night but has not had a fever today yet so she is hoping that the fevers have resolved. She still has 2 more days on her Levaquin. She has taken today's dose this morning. And today is her last dose of prednisone. She's been tolerating it well without any problems or side effects and is eating more normally again. She does still drink Ensure daily to help with her calories.  Small bowel structure-she's doing much better overall. She is now eating more normal diet. Still drinking Ensure daily. Her pain is well controlled. Review of Systems     Objective:   Physical Exam  Constitutional: She is oriented to person, place, and time. She appears well-developed and well-nourished.  HENT:  Head: Normocephalic and atraumatic.  Cardiovascular: Normal rate, regular rhythm and normal heart sounds.   Pulmonary/Chest: Effort normal and breath sounds normal.  Neurological: She is alert and oriented to person, place, and time.  Skin: Skin is warm and dry.  Psychiatric: She has a normal mood and affect. Her behavior is normal.          Assessment & Plan:  HAP/COPD exacerbation - we'll extend the course of steroids for 8 more days and will taper. New perception sent to pharmacy. Make sure complete the Levaquin. If she is still having some low-grade fevers on Thursday and I encouraged her call the office and let me know and we can add 4 more days of antibiotic as needed. Overall I think she is making some great progress and is  getting better. Continue albuterol nebulizer treatments 4 times a day. Can start to wean to 3 times a day when she's feeling much better. If she's doing well followup in one month.  Small bowel structure and-resolved.

## 2014-02-11 ENCOUNTER — Telehealth: Payer: Self-pay | Admitting: *Deleted

## 2014-02-11 NOTE — Telephone Encounter (Signed)
Pt called and stated that she is having still having a low grade fever at night and still not feeling her best. She has two days left on the prednisone, she finished the abx yesterday. She also c/o urinary frequency and has been drinking cranberry juice. Pt advised to come in tomorrow and give urine sample.Audelia Hives [place]

## 2014-02-12 ENCOUNTER — Ambulatory Visit (INDEPENDENT_AMBULATORY_CARE_PROVIDER_SITE_OTHER): Payer: Medicare Other | Admitting: Family Medicine

## 2014-02-12 VITALS — BP 149/71 | HR 102 | Temp 97.9°F | Ht 64.0 in | Wt 89.1 lb

## 2014-02-12 DIAGNOSIS — R350 Frequency of micturition: Secondary | ICD-10-CM

## 2014-02-12 DIAGNOSIS — R319 Hematuria, unspecified: Secondary | ICD-10-CM | POA: Diagnosis not present

## 2014-02-12 LAB — POCT URINALYSIS DIPSTICK
Bilirubin, UA: NEGATIVE
GLUCOSE UA: NEGATIVE
Ketones, UA: NEGATIVE
Leukocytes, UA: NEGATIVE
NITRITE UA: NEGATIVE
Protein, UA: NEGATIVE
Spec Grav, UA: 1.01
UROBILINOGEN UA: 0.2
pH, UA: 7.5

## 2014-02-12 NOTE — Progress Notes (Signed)
   Subjective:    Patient ID: Tamara Adams, female    DOB: [birth year], 73 y.o.   MRN: [id number]  HPI Came in for suprapubic pressure x2 days. She's been drinking a lot more fluids since being diagnosed with pneumonia. She says she's going every 20 minutes. No pain with urination. No gross blood in the urine. No low back pain. No fevers chills or sweats. 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 She follows with urology for this.   Review of Systems     Objective:   Physical Exam        Assessment & Plan:  Urinalysis positive only for blood. She has known persistent hematuria. We'll try to get copies of notes from her urologist office. If culture comes back negative then recommend referral back to urology for possible repeat dilatation of stricture of the urethra. If it comes back positive then we'll treat her accordingly.

## 2014-02-14 LAB — URINE CULTURE
Colony Count: NO GROWTH
ORGANISM ID, BACTERIA: NO GROWTH

## 2014-02-16 ENCOUNTER — Other Ambulatory Visit: Payer: Self-pay | Admitting: Family Medicine

## 2014-02-16 MED ORDER — LEVOFLOXACIN 500 MG PO TABS: 500.0000 mg | ORAL_TABLET | Freq: Every day | ORAL | Status: DC

## 2014-02-25 ENCOUNTER — Telehealth: Payer: Self-pay

## 2014-02-25 ENCOUNTER — Emergency Department (INDEPENDENT_AMBULATORY_CARE_PROVIDER_SITE_OTHER): Payer: Medicare Other

## 2014-02-25 ENCOUNTER — Emergency Department (INDEPENDENT_AMBULATORY_CARE_PROVIDER_SITE_OTHER)
Admission: EM | Admit: 2014-02-25 | Discharge: 2014-02-25 | Disposition: A | Discharge status: Home / Self Care | Payer: Medicare Other | Source: Home / Self Care | Attending: Emergency Medicine | Admitting: Emergency Medicine

## 2014-02-25 ENCOUNTER — Encounter: Payer: Self-pay | Admitting: Emergency Medicine

## 2014-02-25 DIAGNOSIS — J441 Chronic obstructive pulmonary disease with (acute) exacerbation: Secondary | ICD-10-CM

## 2014-02-25 DIAGNOSIS — J449 Chronic obstructive pulmonary disease, unspecified: Secondary | ICD-10-CM

## 2014-02-25 DIAGNOSIS — J209 Acute bronchitis, unspecified: Secondary | ICD-10-CM | POA: Diagnosis not present

## 2014-02-25 MED ORDER — IPRATROPIUM-ALBUTEROL 0.5-2.5 (3) MG/3ML IN SOLN
3.0000 mL | RESPIRATORY_TRACT | Status: AC
  Administered 2014-02-25: 3 mL via RESPIRATORY_TRACT

## 2014-02-25 MED ORDER — METHYLPREDNISOLONE ACETATE 40 MG/ML IJ SUSP
40.0000 mg | Freq: Once | INTRAMUSCULAR | Status: AC
  Administered 2014-02-25: 40 mg via INTRAMUSCULAR

## 2014-02-25 MED ORDER — PREDNISONE (PAK) 10 MG PO TABS: ORAL_TABLET | ORAL | Status: DC

## 2014-02-25 MED ORDER — AZITHROMYCIN 250 MG PO TABS: ORAL_TABLET | ORAL | Status: DC

## 2014-02-25 NOTE — ED Provider Notes (Signed)
CSN: [id number]     Arrival date & time 02/25/14  1325 History   First MD Initiated Contact with Patient 02/25/14 1330     Chief Complaint  Patient presents with  . Cough  Cough, was treated for Pneumonia July 13, finished Levaquin and cough returned, chest is tight  HPI URI HISTORY  Tamara Adams is a 73 y.o. female who complains of onset of chest congestion and wheezing for 5 days.  Have been using over-the-counter treatment which helps minimally.  Positive chills/sweats +  Fever  +  Nasal congestion No  Discolored Post-nasal drainage No sinus pain/pressure No sore throat  +  Cough, productive of yellow sputum Positive wheezing Positive chest congestion No hemoptysis Mild shortness of breath No pleuritic pain  No itchy/red eyes No earache  No nausea No vomiting No abdominal pain No diarrhea  No skin rashes +  Fatigue No myalgias Minimal nonfocal headache   No syncope or lightheadedness or focal neurologic symptoms.  Also noted mild swelling right foot and ankle without pain the past week or so, but that's improved the past couple of days  Past Medical History  Diagnosis Date  . COPD (chronic obstructive pulmonary disease)     FVC 72%, FEV1 29%, FEV1 ratio 32% (very severe (COPD)  . Hypertension   . Pneumonia   . DVT (deep venous thrombosis)     "years ago after a surgery"  . Pinched nerve     in back  . Peptic ulcer   . Colon polyp     removed, benign  . H/O hiatal hernia   . Degenerative disc disease   . Anemia     as a child  . Kidney stone    Past Surgical History  Procedure Laterality Date  . Abdominal hysterectomy  1987    and one ovary  . Oophorectomy  1992  . Cataract extraction w/phaco  06/12/2012    Dr. Boykin Reaper, left eye  . Cataract extraction  06/22/2012    Right eye, Dr. Lester Kinsman.   Marland Kitchen Urethra surgery    . Small intestine surgery      for a blockage, no bowel was removed, just kinked from scar tissue  . Cholecystectomy N/A  08/18/2013    Procedure: LAPAROSCOPIC CHOLECYSTECTOMY;  Surgeon: Harl Bowie, MD;  Location: [facility] OR;  Service: General;  Laterality: N/A;   Family History  Problem Relation Age of Onset  . Hypertension Mother   . Ovarian cancer Mother    History  Substance Use Topics  . Smoking status: Current Every Day Smoker -- 0.25 packs/day for 60 years    Types: Cigarettes  . Smokeless tobacco: Never Used     Comment: Using Electronic cigarettes  . Alcohol Use: No   OB History   Grav Para Term Preterm Abortions TAB SAB Ect Mult Living                 Review of Systems  All other systems reviewed and are negative.   Allergies  Maxidex and Tylox  Home Medications   Prior to Admission medications   Medication Sig Start Date End Date Taking? Authorizing Provider  albuterol (PROVENTIL) (2.5 MG/3ML) 0.083% nebulizer solution Take 3 mLs (2.5 mg total) by nebulization every 6 (six) hours as needed for wheezing or shortness of breath. 10/22/13   [name], MD  albuterol (VENTOLIN HFA) 108 (90 BASE) MCG/ACT inhaler Inhale 2 puffs into the lungs every 6 (six) hours as needed. 2-4 puffs inhaled  every 4-6 hours 10/22/13   [name], MD  alendronate (FOSAMAX) 70 MG tablet Take 1 tablet (70 mg total) by mouth once a week. Take with a full glass of water on an empty stomach. 06/24/13   [name], MD  amLODipine-benazepril (LOTREL) 10-20 MG per capsule Take 1 capsule by mouth daily.    Historical Provider, MD  azithromycin (ZITHROMAX Z-PAK) 250 MG tablet Take 2 tablets on day one, then 1 tablet daily on days 2 through 5 02/25/14   Jacqulyn Cane, MD  beclomethasone (QVAR) 80 MCG/ACT inhaler Inhale 2 puffs into the lungs 2 (two) times daily. 10/22/13   [name], MD  LORazepam (ATIVAN) 1 MG tablet Take 1 tablet (1 mg total) by mouth daily as needed for anxiety or sleep. 01/21/14   [name], MD  Multiple Vitamin (MULTIVITAMIN WITH MINERALS) TABS tablet Take 1  tablet by mouth daily.    Historical Provider, MD  omeprazole (PRILOSEC) 40 MG capsule Take 40 mg by mouth daily.    Historical Provider, MD  ondansetron (ZOFRAN-ODT) 4 MG disintegrating tablet Take 1 tablet (4 mg total) by mouth every 8 (eight) hours as needed for nausea. 10/16/13   [name], MD  pravastatin (PRAVACHOL) 40 MG tablet Take 1 tablet (40 mg total) by mouth at bedtime. 12/30/12   [name], MD  predniSONE (STERAPRED UNI-PAK) 10 MG tablet Take as directed for 6 days.--Take 6 on day 1, 5 on day 2, 4 on day 3, then 3 tablets on day 4, then 2 tablets on day 5, then 1 on day 6. 02/25/14   Jacqulyn Cane, MD  tiotropium (SPIRIVA HANDIHALER) 18 MCG inhalation capsule Use 2 inhalations from 1 capsule only once daily 10/22/13   [name], MD   BP 154/68  Pulse 90  Temp(Src) 98.7 F (37.1 C) (Oral)  Ht 5\' 4"  (1.626 m)  Wt 88 lb (39.917 kg)  BMI 15.10 kg/m2  SpO2 95% Physical Exam  Nursing note and vitals reviewed. Constitutional: She is oriented to person, place, and time. She appears well-developed and well-nourished. No distress.  Appears acutely ill, but she is alert and cooperative and ambulatory.--Minimal dyspnea and noted but no acute cardiorespiratory distress  HENT:  Head: Normocephalic and atraumatic.  Right Ear: Tympanic membrane normal.  Left Ear: Tympanic membrane normal.  Nose: Nose normal.  Mouth/Throat: Oropharynx is clear and moist. No oropharyngeal exudate.  Eyes: Conjunctivae and EOM are normal. Pupils are equal, round, and reactive to light. Right eye exhibits no discharge. Left eye exhibits no discharge. No scleral icterus.  Neck: Neck supple. No JVD present.  Cardiovascular: Normal rate, regular rhythm and normal heart sounds.   Pulmonary/Chest: No stridor. No respiratory distress. She has wheezes. She has rhonchi. She has no rales.  Abdominal: She exhibits no distension. There is no tenderness.  Musculoskeletal: Normal range of motion.   Right foot and ankle exam negative. No heat or tenderness or swelling or redness or red streaks. Neurovascular intact. Negative Homans sign  Lymphadenopathy:    She has no cervical adenopathy.  Neurological: She is alert and oriented to person, place, and time.  Skin: Skin is warm and dry. No rash noted. She is not diaphoretic.  Psychiatric: She has a normal mood and affect.    ED Course  Procedures (including critical care time) Labs Review Labs Reviewed - No data to display  Imaging Review Dg Chest 2 View  02/25/2014   CLINICAL DATA:  Cough,  COPD  EXAM: CHEST  2 VIEW  COMPARISON:  02/01/2014  FINDINGS: Cardiomediastinal silhouette is stable. Mild hyperinflation again noted. No acute infiltrate or pleural effusion. No pulmonary edema. Stable degenerative changes thoracic spine.  IMPRESSION: Stable COPD.  No active disease.   Electronically Signed   By: Lahoma Crocker M.D.   On: 02/25/2014 14:15     MDM   1. Acute exacerbation of chronic obstructive pulmonary disease (COPD)   2. Acute bronchitis with bronchospasm    Treatment options discussed, as well as risks, benefits, alternatives. Patient voiced understanding and agreement with the following plans:  Chest x-ray--no infiltrates. There is stable COPD, but no active disease . Over 40 minutes spent, greater than 50% of the time spent for counseling and coordination of care.  Patient voiced understanding and agreement with the following plans: DuoNeb nebulizer treatment given. Wheezing improved. Zithromax Z-Pak Prednisone 10 mg-6 day Dosepak Depo-Medrol 40 mg IM stat She has appointment for recheck with her PCP Dr. Madilyn Fireman on 8/17, but I advised following up sooner if not improving or if worsening symptoms.  Precautions discussed. Red flags discussed.--ER if any red flags. Questions invited and answered. Patient voiced understanding and agreement.   Jacqulyn Cane, MD 02/27/14 [phone]

## 2014-02-25 NOTE — ED Notes (Signed)
Cough, was treated for Pneumonia July 13, finished Levaquin and cough returned, chest is tight

## 2014-02-25 NOTE — Telephone Encounter (Signed)
Pt called and stated that she was seen last week by Dr. Madilyn Fireman for a UTI and she stated that now she feels like has bronchitis and she would like something called in. I explained to her that she would have to be seen so she can be evaluated that way if a medicine is needed that the right one is given. There are no available opening today or tomorrow and I told the pt that she can go over to Urgent Care and get evaluated there and the pt stated that she will just see how she does over the next couple of days. I told the pt if she starts to feeling worse, coughing up mucous, chills, sweats, nausea or fever that she needs to to Urgent Care and be seen./Neytiri Asche,CMA

## 2014-03-08 ENCOUNTER — Ambulatory Visit (INDEPENDENT_AMBULATORY_CARE_PROVIDER_SITE_OTHER): Payer: Medicare Other | Admitting: Family Medicine

## 2014-03-08 ENCOUNTER — Encounter: Payer: Self-pay | Admitting: Family Medicine

## 2014-03-08 VITALS — BP 158/74 | HR 97 | Wt 86.0 lb

## 2014-03-08 DIAGNOSIS — G4700 Insomnia, unspecified: Secondary | ICD-10-CM | POA: Diagnosis not present

## 2014-03-08 DIAGNOSIS — L84 Corns and callosities: Secondary | ICD-10-CM | POA: Diagnosis not present

## 2014-03-08 DIAGNOSIS — I10 Essential (primary) hypertension: Secondary | ICD-10-CM | POA: Diagnosis not present

## 2014-03-08 DIAGNOSIS — J449 Chronic obstructive pulmonary disease, unspecified: Secondary | ICD-10-CM | POA: Diagnosis not present

## 2014-03-08 DIAGNOSIS — J4489 Other specified chronic obstructive pulmonary disease: Secondary | ICD-10-CM

## 2014-03-08 MED ORDER — TRAZODONE HCL 50 MG PO TABS: 25.0000 mg | ORAL_TABLET | Freq: Every evening | ORAL | Status: DC | PRN

## 2014-03-08 MED ORDER — BUDESONIDE-FORMOTEROL FUMARATE 80-4.5 MCG/ACT IN AERO: 2.0000 | INHALATION_SPRAY | Freq: Two times a day (BID) | RESPIRATORY_TRACT | Status: DC

## 2014-03-08 NOTE — Assessment & Plan Note (Signed)
Still not well controlled. She's been on the border of flaring since having her pneumonia. She's not back to baseline yet. She's actually still been using her albuterol nebulizer treatments 3 times a day and using her albuterol HFA every couple of days. We discussed putting her on a long acting albuterol. Will stop Qvar and switch her to Symbicort. And she can hopefully decrease her albuterol nebulizer treatments 2 as needed. I like to see her back in one month to make sure she's doing well.

## 2014-03-08 NOTE — Assessment & Plan Note (Signed)
Uncontrolled. It may be elevated secondary to lack of sleep and COPD not being well controlled. I will address again at followup in one month. Still elevated then we will need to adjust her medication regimen.

## 2014-03-08 NOTE — Progress Notes (Signed)
   Subjective:    Patient ID: Tamara Adams, female    DOB: 06/09/1941, 73 y.o.   MRN: [id number]  HPI COPD - Using her oxygen at night.  Say still doesn't  Feel ike her baseline since her pneumonia.  Still some cough.  Using her albuterol every 2 days or so.  Using Spiriva.   Insomnia- Says not really sleeping well. Says can get about 2-3 hours and then wake up and get back to sleep She just feel really exhausted and fatigued.  Says also getting over recent pneumonia.    Her hairdresser has noticed a spot behind her right ear. No pain or itching.  Says not sure what it is.    Hypertension- Pt denies chest pain, SOB, dizziness, or heart palpitations.  Taking meds as directed w/o problems.  Denies medication side effects.    She also has corn bt 4th and 5th on her right foot.  We did cyrotherpay o the lesion before and that did helps.   nReview of Systems     Objective:   Physical Exam  Constitutional: She is oriented to person, place, and time. She appears well-developed and well-nourished.  HENT:  Head: Normocephalic and atraumatic.  Cardiovascular: Normal rate, regular rhythm and normal heart sounds.   Pulmonary/Chest: Effort normal and breath sounds normal.  Neurological: She is alert and oriented to person, place, and time.  Skin: Skin is warm and dry.  She has a corn between the fourth and fifth digits on her right foot.  Psychiatric: She has a normal mood and affect. Her behavior is normal.          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 Gave reassurance. Followup if becomes painful, irritated, bleeds, or is getting larger.  Corn on right foot-corn was her down and cryotherapy was performed. Patient tolerated well.  Fatigue-most likely related to her COPD. Sometimes it can take a couple months ago for pneumonia completely. We'll also address his sleep issue which could be acute be big contributer  Cryotherapy Procedure  Note  Pre-operative Diagnosis: corn  Post-operative Diagnosis: corn  Locations: b/t 4th and 5th toes on right foot.   Indications: Pain  Anesthesia: not required    Procedure Details  Patient informed of risks (permanent scarring, infection, light or dark discoloration, bleeding, infection, weakness, numbness and recurrence of the lesion) and benefits of the procedure and verbal informed consent obtained.  The areas are treated with liquid nitrogen therapy, frozen until ice ball extended 1 mm beyond lesion, allowed to thaw, and treated again. The patient tolerated procedure well.  The patient was instructed on post-op care, warned that there may be blister formation, redness and pain. Recommend OTC analgesia as needed for pain.  Condition: Stable  Complications: none.  Plan: 1. Instructed to keep the area dry and covered for 24-48h and clean thereafter. 2. Warning signs of infection were reviewed.   3. Recommended that the patient use OTC acetaminophen as needed for pain.  4. PRN f/u.

## 2014-03-08 NOTE — Assessment & Plan Note (Signed)
Not well controlled currently. We discussed a trial of trazodone which can help with sleep maintenance. Start with half a tab and increase up to one half as needed. Followup in one month to make sure she's tolerating well. Monitor for excess sedation.

## 2014-03-12 ENCOUNTER — Other Ambulatory Visit: Payer: Self-pay | Admitting: *Deleted

## 2014-03-12 ENCOUNTER — Other Ambulatory Visit: Payer: Self-pay

## 2014-03-12 MED ORDER — ONDANSETRON 4 MG PO TBDP: 4.0000 mg | ORAL_TABLET | Freq: Three times a day (TID) | ORAL | Status: DC | PRN

## 2014-03-12 NOTE — Telephone Encounter (Signed)
[facility] sent a prior authorization request for Symbicort 80-4.5 mcg. I called [employer] at [phone] and started a prior authorization. The authroization was denies due to the fact the patient has not tried Advair Diskus or Advair HFA. Patient has never tried and failed either medication. Please advise.

## 2014-03-15 MED ORDER — FLUTICASONE-SALMETEROL 115-21 MCG/ACT IN AERO: 2.0000 | INHALATION_SPRAY | Freq: Two times a day (BID) | RESPIRATORY_TRACT | Status: DC

## 2014-03-15 NOTE — Telephone Encounter (Signed)
We can switch to Advair HFA if she is ok with that. I will put in Rx adn then we can send wherever she wants ti.

## 2014-03-31 ENCOUNTER — Ambulatory Visit (INDEPENDENT_AMBULATORY_CARE_PROVIDER_SITE_OTHER): Payer: Medicare Other | Admitting: Physician Assistant

## 2014-03-31 ENCOUNTER — Ambulatory Visit (INDEPENDENT_AMBULATORY_CARE_PROVIDER_SITE_OTHER): Payer: Medicare Other

## 2014-03-31 ENCOUNTER — Encounter: Payer: Self-pay | Admitting: Physician Assistant

## 2014-03-31 VITALS — BP 170/78 | HR 130 | Temp 99.3°F | Ht 64.0 in | Wt 87.0 lb

## 2014-03-31 DIAGNOSIS — R059 Cough, unspecified: Secondary | ICD-10-CM | POA: Diagnosis not present

## 2014-03-31 DIAGNOSIS — R000 Tachycardia, unspecified: Secondary | ICD-10-CM

## 2014-03-31 DIAGNOSIS — R0602 Shortness of breath: Secondary | ICD-10-CM | POA: Diagnosis not present

## 2014-03-31 DIAGNOSIS — R030 Elevated blood-pressure reading, without diagnosis of hypertension: Secondary | ICD-10-CM

## 2014-03-31 DIAGNOSIS — J441 Chronic obstructive pulmonary disease with (acute) exacerbation: Secondary | ICD-10-CM | POA: Diagnosis not present

## 2014-03-31 DIAGNOSIS — R0902 Hypoxemia: Secondary | ICD-10-CM

## 2014-03-31 DIAGNOSIS — R05 Cough: Secondary | ICD-10-CM

## 2014-03-31 DIAGNOSIS — IMO0001 Reserved for inherently not codable concepts without codable children: Secondary | ICD-10-CM

## 2014-03-31 MED ORDER — PREDNISONE 20 MG PO TABS
ORAL_TABLET | ORAL | Status: DC
Start: 2014-03-31 — End: 2014-05-03

## 2014-03-31 MED ORDER — DOXYCYCLINE HYCLATE 100 MG PO TABS: 100.0000 mg | ORAL_TABLET | Freq: Two times a day (BID) | ORAL | Status: DC

## 2014-03-31 MED ORDER — METHYLPREDNISOLONE ACETATE 40 MG/ML IJ SUSP
40.0000 mg | Freq: Once | INTRAMUSCULAR | Status: AC
  Administered 2014-03-31: 40 mg via INTRAMUSCULAR

## 2014-03-31 MED ORDER — FLUTICASONE FUROATE-VILANTEROL 100-25 MCG/INH IN AEPB: INHALATION_SPRAY | RESPIRATORY_TRACT | Status: DC

## 2014-03-31 MED ORDER — METHYLPREDNISOLONE SODIUM SUCC 125 MG IJ SOLR
125.0000 mg | Freq: Once | INTRAMUSCULAR | Status: AC
  Administered 2014-03-31: 125 mg via INTRAMUSCULAR

## 2014-03-31 NOTE — Progress Notes (Signed)
   Subjective:    Patient ID: Tamara Adams, female    DOB: 04/29/1941, 73 y.o.   MRN: [id number]  HPI Pt presents to the clinic with ongoing SOB, chest tightness, cough. She ultimately has not felt great since pneumonia in June 2015. She has had multiple COPD exacerbations. She was given symbicort to try but insurance would not pay for it. She had to try advair. No improvement with advair. She has gone back to using qvar only. She had a low grade 100 fever last night. Cough is becoming more productive. Using nebs at least 3-4 times a day. Last use today was 3pm. No swelling of extremities. Denies ST, ear pain, sinus pressure, nasal congestion or URI symptoms.      Review of Systems  All other systems reviewed and are negative.      Objective:   Physical Exam  Constitutional: She is oriented to person, place, and time. She appears well-developed and well-nourished.  HENT:  Head: Normocephalic and atraumatic.  Right Ear: External ear normal.  Left Ear: External ear normal.  Nose: Nose normal.  Mouth/Throat: Oropharynx is clear and moist. No oropharyngeal exudate.  Eyes: Conjunctivae are normal. Right eye exhibits no discharge. Left eye exhibits no discharge.  Neck: Normal range of motion. Neck supple.  Cardiovascular: Regular rhythm and normal heart sounds.   Tachycardia at 130.   Pulmonary/Chest:  Pulse ox 91 percent.  Wheezing bilaterally.  Rhonchi bilaterally.    Lymphadenopathy:    She has no cervical adenopathy.  Neurological: She is alert and oriented to person, place, and time.  Skin: Skin is dry.  Psychiatric: She has a normal mood and affect. Her behavior is normal.          Assessment & Plan:    COPd exacerbation/hypoxemia- will recheck CXR. Start doxycycline for 10 days. Depo medrol 40mg  and solumedrol 125mg  IM today. rx for prednisone given to start in 3 days if not improving. Stop qvar. Advair just did not help and insurance would not pay for symbicort. Start  breo. Sample given for one month. Discussed how to use. Continue spirivia. Follow up on Friday.   Elevated BP- likely due to SOB and COPD exacerbation. Will recheck on Friday.

## 2014-04-02 ENCOUNTER — Ambulatory Visit (INDEPENDENT_AMBULATORY_CARE_PROVIDER_SITE_OTHER): Payer: Medicare Other | Admitting: Physician Assistant

## 2014-04-02 ENCOUNTER — Encounter: Payer: Self-pay | Admitting: Physician Assistant

## 2014-04-02 VITALS — BP 144/62 | HR 105 | Ht 64.0 in | Wt 87.0 lb

## 2014-04-02 DIAGNOSIS — R0602 Shortness of breath: Secondary | ICD-10-CM | POA: Diagnosis not present

## 2014-04-02 DIAGNOSIS — J441 Chronic obstructive pulmonary disease with (acute) exacerbation: Secondary | ICD-10-CM

## 2014-04-02 DIAGNOSIS — R0902 Hypoxemia: Secondary | ICD-10-CM | POA: Diagnosis not present

## 2014-04-02 LAB — CBC WITH DIFFERENTIAL/PLATELET
BASOS PCT: 0 % (ref 0–1)
Basophils Absolute: 0 10*3/uL (ref 0.0–0.1)
EOS ABS: 0 10*3/uL (ref 0.0–0.7)
EOS PCT: 0 % (ref 0–5)
HCT: 39.2 % (ref 36.0–46.0)
HEMOGLOBIN: 12.9 g/dL (ref 12.0–15.0)
Lymphocytes Relative: 9 % — ABNORMAL LOW (ref 12–46)
Lymphs Abs: 1.5 10*3/uL (ref 0.7–4.0)
MCH: 30.8 pg (ref 26.0–34.0)
MCHC: 33 g/dL (ref 30.0–36.0)
MCV: 93.5 fL (ref 78.0–100.0)
MONO ABS: 0.8 10*3/uL (ref 0.1–1.0)
MONOS PCT: 5 % (ref 3–12)
Neutro Abs: 14 10*3/uL — ABNORMAL HIGH (ref 1.7–7.7)
Neutrophils Relative %: 86 % — ABNORMAL HIGH (ref 43–77)
Platelets: 545 10*3/uL — ABNORMAL HIGH (ref 150–400)
RBC: 4.19 MIL/uL (ref 3.87–5.11)
RDW: 13.6 % (ref 11.5–15.5)
WBC: 16.3 10*3/uL — ABNORMAL HIGH (ref 4.0–10.5)

## 2014-04-02 NOTE — Patient Instructions (Addendum)
Start oral prednisone.  Continue nebulizer every 4 hours around clock.  Use O2 when stats are below 92.  Finish doxycycline.  Continue BREO and spiriva.

## 2014-04-03 LAB — D-DIMER, QUANTITATIVE: D-Dimer, Quant: 0.48 ug/mL-FEU (ref 0.00–0.48)

## 2014-04-04 MED ORDER — FLUTICASONE FUROATE-VILANTEROL 100-25 MCG/INH IN AEPB: 1.0000 | INHALATION_SPRAY | Freq: Every day | RESPIRATORY_TRACT | Status: DC

## 2014-04-04 NOTE — Progress Notes (Signed)
   Subjective:    Patient ID: Tamara Adams, female    DOB: September 25, 1940, 73 y.o.   MRN: [id number]  HPI Pt presents to the clinic for 2 day follow up on COPD. She does feel some better but continues to wheeze and feel SOB. Her pulse ox stays below 90 without O2. She has O2 at home. No fever. On doxy, nebs, and inhalers. BREO has helped a lot. Much better than other inhalers.    Review of Systems  All other systems reviewed and are negative.      Objective:   Physical Exam  Constitutional: She is oriented to person, place, and time. She appears well-developed and well-nourished.  HENT:  Head: Normocephalic and atraumatic.  Cardiovascular: Regular rhythm and normal heart sounds.   Tachycardia at 105.   Pulmonary/Chest:  Decreased effort. Coarse breath sounds. Some wheezing and rhonchi. Did seem to be improved from 2 days ago.   Neurological: She is alert and oriented to person, place, and time.  Skin: Skin is dry.  Extremities- no swelling or redness.   Psychiatric: She has a normal mood and affect. Her behavior is normal.          Assessment & Plan:  COPD exacerbation/hypoxemia/SOb-will check cbc and d-dimer for possiblity of blood clot. Start oral prednisone given at last visit. Continue nebulizer every 4 hours around clock.  Use O2 when stats are below 92.  Finish doxycycline.  Continue BREO and spiriva.  Sent refill for BREO she has noticed significant improvement with BREO.

## 2014-04-05 ENCOUNTER — Encounter: Payer: Self-pay | Admitting: Family Medicine

## 2014-04-05 ENCOUNTER — Ambulatory Visit (INDEPENDENT_AMBULATORY_CARE_PROVIDER_SITE_OTHER): Payer: Medicare Other | Admitting: Family Medicine

## 2014-04-05 VITALS — BP 141/77 | HR 98

## 2014-04-05 DIAGNOSIS — J441 Chronic obstructive pulmonary disease with (acute) exacerbation: Secondary | ICD-10-CM | POA: Diagnosis not present

## 2014-04-05 DIAGNOSIS — G4700 Insomnia, unspecified: Secondary | ICD-10-CM

## 2014-04-05 MED ORDER — FLUTICASONE FUROATE-VILANTEROL 100-25 MCG/INH IN AEPB: 1.0000 | INHALATION_SPRAY | Freq: Every day | RESPIRATORY_TRACT | Status: DC

## 2014-04-05 NOTE — Progress Notes (Signed)
   Subjective:    Patient ID: Tamara Adams, female    DOB: 12/29/1940, 73 y.o.   MRN: [id number]  HPI Followup COPD-she was seen approximately 4 days ago for COPD exacerbation. She was told to continue her doxycycline. Encouraged use review every 4 hours and given prednisone. She's currently using pre-and Spiriva. Last neb was right before she came in. Pulse ox was down to 88% last week. CP is better. She likes the [facility] much better than Advair or Symbicort.    Insomnia-she was also seen about 4 weeks ago for insomnia. We decided to try trazodone as a sleep aid. The sleep aid caused hear racing. And says it didn't work well.   Review of Systems     Objective:   Physical Exam  Constitutional: She is oriented to person, place, and time. She appears well-developed and well-nourished.  HENT:  Head: Normocephalic and atraumatic.  Cardiovascular: Normal rate, regular rhythm and normal heart sounds.   Pulmonary/Chest: Effort normal and breath sounds normal.  Neurological: She is alert and oriented to person, place, and time.  Skin: Skin is warm and dry.  Psychiatric: She has a normal mood and affect. Her behavior is normal.          Assessment & Plan:  COPD exacerbation-overall she is improving but not back to baseline. Her pulse ox was much better today. I encouraged her to use her oxygen continuously over the next week or 2 until she's feeling completely better. She mostly uses it at night normally. X-ray complete the antibiotic and the steroids. Call if suddenly gets worse. Her chest discomfort has been getting better over the last couple of days. Call if any fevers chills or sweats.  Insomnia-has actually been getting a little but better on its own. We'll remove trazodone from her medication list and they cause side effects. She says she plans to discuss going back to using her lorazepam as needed.

## 2014-04-14 ENCOUNTER — Other Ambulatory Visit: Payer: Self-pay | Admitting: *Deleted

## 2014-04-14 MED ORDER — FLUTICASONE FUROATE-VILANTEROL 100-25 MCG/INH IN AEPB: 1.0000 | INHALATION_SPRAY | Freq: Every day | RESPIRATORY_TRACT | Status: DC

## 2014-04-17 ENCOUNTER — Other Ambulatory Visit: Payer: Self-pay | Admitting: *Deleted

## 2014-05-03 ENCOUNTER — Ambulatory Visit (INDEPENDENT_AMBULATORY_CARE_PROVIDER_SITE_OTHER): Payer: Medicare Other | Admitting: Family Medicine

## 2014-05-03 ENCOUNTER — Encounter: Payer: Self-pay | Admitting: Family Medicine

## 2014-05-03 VITALS — BP 144/72 | HR 93 | Wt 89.0 lb

## 2014-05-03 DIAGNOSIS — J411 Mucopurulent chronic bronchitis: Secondary | ICD-10-CM

## 2014-05-03 DIAGNOSIS — E559 Vitamin D deficiency, unspecified: Secondary | ICD-10-CM | POA: Diagnosis not present

## 2014-05-03 DIAGNOSIS — R5382 Chronic fatigue, unspecified: Secondary | ICD-10-CM

## 2014-05-03 DIAGNOSIS — G4700 Insomnia, unspecified: Secondary | ICD-10-CM

## 2014-05-03 DIAGNOSIS — E43 Unspecified severe protein-calorie malnutrition: Secondary | ICD-10-CM | POA: Diagnosis not present

## 2014-05-03 MED ORDER — LORAZEPAM 1 MG PO TABS: 1.0000 mg | ORAL_TABLET | Freq: Every day | ORAL | Status: DC | PRN

## 2014-05-03 NOTE — Progress Notes (Signed)
   Subjective:    Patient ID: Tamara Adams, female    DOB: May 06, 1941, 73 y.o.   MRN: [id number]  HPI COPD - has been wearing her oxygen more than she was but not continuous.  Overall she's feeling some better. She still just complains about feeling very fatigued. Cough and sputum production are better. No fevers chills or sweats. She still has a wet cough with white-colored phlegm.  Insomnia - now using the lorazepam.  Helps some. She thinks she might try the trazodone again. She's not sure which one she feels works better..    Malnutrition-Drinks 2 ensures a day.  Eats 4 small meals a day. Review of Systems     Objective:   Physical Exam  Constitutional: She is oriented to person, place, and time. She appears well-developed and well-nourished.  HENT:  Head: Normocephalic and atraumatic.  Cardiovascular: Normal rate, regular rhythm and normal heart sounds.   Pulmonary/Chest: Effort normal and breath sounds normal.  Neurological: She is alert and oriented to person, place, and time.  Skin: Skin is warm and dry.  Psychiatric: She has a normal mood and affect. Her behavior is normal.          Assessment & Plan:

## 2014-05-04 ENCOUNTER — Encounter: Payer: Self-pay | Admitting: Family Medicine

## 2014-05-04 LAB — COMPLETE METABOLIC PANEL WITH GFR
ALBUMIN: 3.9 g/dL (ref 3.5–5.2)
ALT: 20 U/L (ref 0–35)
AST: 26 U/L (ref 0–37)
Alkaline Phosphatase: 83 U/L (ref 39–117)
BILIRUBIN TOTAL: 0.3 mg/dL (ref 0.2–1.2)
BUN: 8 mg/dL (ref 6–23)
CO2: 29 meq/L (ref 19–32)
Calcium: 9.2 mg/dL (ref 8.4–10.5)
Chloride: 95 mEq/L — ABNORMAL LOW (ref 96–112)
Creat: 0.59 mg/dL (ref 0.50–1.10)
GFR, Est Non African American: >89 mL/min
GLUCOSE: 112 mg/dL — AB (ref 70–99)
POTASSIUM: 3.9 meq/L (ref 3.5–5.3)
Sodium: 135 mEq/L (ref 135–145)
Total Protein: 6.2 g/dL (ref 6.0–8.3)

## 2014-05-04 LAB — CBC WITH DIFFERENTIAL/PLATELET
BASOS ABS: 0.1 10*3/uL (ref 0.0–0.1)
BASOS PCT: 1 % (ref 0–1)
Eosinophils Absolute: 0.1 10*3/uL (ref 0.0–0.7)
Eosinophils Relative: 2 % (ref 0–5)
HEMATOCRIT: 39 % (ref 36.0–46.0)
HEMOGLOBIN: 13.3 g/dL (ref 12.0–15.0)
Lymphocytes Relative: 26 % (ref 12–46)
Lymphs Abs: 1.7 10*3/uL (ref 0.7–4.0)
MCH: 31 pg (ref 26.0–34.0)
MCHC: 34.1 g/dL (ref 30.0–36.0)
MCV: 90.9 fL (ref 78.0–100.0)
MONO ABS: 0.7 10*3/uL (ref 0.1–1.0)
Monocytes Relative: 10 % (ref 3–12)
NEUTROS ABS: 4.1 10*3/uL (ref 1.7–7.7)
NEUTROS PCT: 61 % (ref 43–77)
Platelets: 479 10*3/uL — ABNORMAL HIGH (ref 150–400)
RBC: 4.29 MIL/uL (ref 3.87–5.11)
RDW: 13.8 % (ref 11.5–15.5)
WBC: 6.7 10*3/uL (ref 4.0–10.5)

## 2014-05-04 LAB — TSH: TSH: 1.163 u[IU]/mL (ref 0.350–4.500)

## 2014-05-04 LAB — VITAMIN D 25 HYDROXY (VIT D DEFICIENCY, FRACTURES): Vit D, 25-Hydroxy: 36 ng/mL (ref 30–89)

## 2014-05-04 LAB — FERRITIN: FERRITIN: 29 ng/mL (ref 10–291)

## 2014-05-04 NOTE — Assessment & Plan Note (Signed)
She's doing better by trying to get to ensure drinks a day in addition to 4 small meals. Back in September we have gotten her up to 105 pounds but she has topic down to 93 pounds. Make sure she's getting an adequate calorie intake in addition to wearing her oxygen continuously. I discussed with her that when she doesn't wear it her heart and body has to work harder to breathe and that burns excess calories which she does not have.

## 2014-05-04 NOTE — Assessment & Plan Note (Addendum)
Stable after recent exacerbation. Continue to use Spiriva and reactive. No difficulty with the devices themselves. Followup in 2-3 months.She uses and continues to need her Nebulizer and uses her nebulized albuterol solution.

## 2014-05-04 NOTE — Assessment & Plan Note (Signed)
Right now using lorazepam. He does seem to get her at least several hours of sleep. Make sure using oxygen every night. She says she plans to try the trazodone again.

## 2014-05-10 ENCOUNTER — Telehealth: Payer: Self-pay | Admitting: *Deleted

## 2014-05-10 MED ORDER — AMBULATORY NON FORMULARY MEDICATION: Status: DC

## 2014-05-10 NOTE — Telephone Encounter (Signed)
Pt lvm asking for neb machine to be sent to [facility] .Marland KitchenAudelia Hives [place]

## 2014-05-13 DIAGNOSIS — Z23 Encounter for immunization: Secondary | ICD-10-CM | POA: Diagnosis not present

## 2014-06-16 ENCOUNTER — Other Ambulatory Visit: Payer: Self-pay | Admitting: *Deleted

## 2014-06-16 MED ORDER — ONDANSETRON 4 MG PO TBDP: 4.0000 mg | ORAL_TABLET | Freq: Three times a day (TID) | ORAL | Status: DC | PRN

## 2014-07-05 ENCOUNTER — Encounter: Payer: Self-pay | Admitting: Gastroenterology

## 2014-07-05 ENCOUNTER — Telehealth: Payer: Self-pay | Admitting: *Deleted

## 2014-07-05 ENCOUNTER — Encounter: Payer: Self-pay | Admitting: Family Medicine

## 2014-07-05 ENCOUNTER — Ambulatory Visit (INDEPENDENT_AMBULATORY_CARE_PROVIDER_SITE_OTHER): Payer: Medicare Other | Admitting: Family Medicine

## 2014-07-05 VITALS — BP 138/62 | HR 87 | Ht 64.0 in | Wt 93.0 lb

## 2014-07-05 DIAGNOSIS — I10 Essential (primary) hypertension: Secondary | ICD-10-CM

## 2014-07-05 DIAGNOSIS — K5669 Other intestinal obstruction: Secondary | ICD-10-CM

## 2014-07-05 DIAGNOSIS — K56609 Unspecified intestinal obstruction, unspecified as to partial versus complete obstruction: Secondary | ICD-10-CM

## 2014-07-05 DIAGNOSIS — J441 Chronic obstructive pulmonary disease with (acute) exacerbation: Secondary | ICD-10-CM

## 2014-07-05 DIAGNOSIS — E43 Unspecified severe protein-calorie malnutrition: Secondary | ICD-10-CM

## 2014-07-05 DIAGNOSIS — J209 Acute bronchitis, unspecified: Secondary | ICD-10-CM | POA: Diagnosis not present

## 2014-07-05 MED ORDER — PREDNISONE 20 MG PO TABS: 40.0000 mg | ORAL_TABLET | Freq: Every day | ORAL | Status: DC

## 2014-07-05 MED ORDER — DOXYCYCLINE HYCLATE 100 MG PO TABS: 100.0000 mg | ORAL_TABLET | Freq: Two times a day (BID) | ORAL | Status: DC

## 2014-07-05 NOTE — Telephone Encounter (Addendum)
Spoke w/Melissa at [facility] and was informed that they will need the face to face note for her nebulizer to be faxed to [phone]. OV faxed Elouise Munroe

## 2014-07-05 NOTE — Progress Notes (Signed)
   Subjective:    Patient ID: Tamara Adams, female    DOB: Jun 27, 1941, 73 y.o.   MRN: [id number]  HPI  COPD f/u - Using the spiriva and Breo. Says thinks her oxygen levels have been better the last few weeks. No recent flare.  Has had a chest cold for 2-3 days. Has cough with productive sputum that is white. She's not wearing her oxygen currently.  Still having a lot of trouble of her stomach and says she plans on going back to [facility].  Hx of GI ulcers but this pain is different. Hx of in [facility] in July and noted to have liver lesions.  Due for repeat CT in January   Malnutrition -  sHE IS UP 3 lbs. she is working hard increase her protein and get her nutrition drinks like boost in. She's noticed her pants are fitting much better.  Hypertension- Pt denies chest pain, SOB, dizziness, or heart palpitations.  Taking meds as directed w/o problems.  Denies medication side effects.      Review of Systems     Objective:   Physical Exam  Constitutional: She is oriented to person, place, and time. She appears well-developed and well-nourished.  HENT:  Head: Normocephalic and atraumatic.  Right Ear: External ear normal.  Left Ear: External ear normal.  Nose: Nose normal.  Mouth/Throat: Oropharynx is clear and moist.  TMs and canals are clear.   Eyes: Conjunctivae and EOM are normal. Pupils are equal, round, and reactive to light.  Neck: Neck supple. No thyromegaly present.  Cardiovascular: Normal rate, regular rhythm and normal heart sounds.   Pulmonary/Chest: Effort normal and breath sounds normal. She has no wheezes.  Lymphadenopathy:    She has no cervical adenopathy.  Neurological: She is alert and oriented to person, place, and time.  Skin: Skin is warm and dry.  Psychiatric: She has a normal mood and affect. Her behavior is normal.          Assessment & Plan:  COPD exacerbation/acute bronchitis - Will tx with doxy and prednisone. Call if not better in one week. Be  aggressive with albuterol over next few days.   Malnutrition-up 3 pounds which is fantastic. Continue current regimen as it seems to be working very well for her.  Abdominal pain with history of small bowel obstruction-nothing acute but would like to see GI again and also follow up on her liver lesions. She would prefer to be seen within the [facility] system. Will refer to [facility].  Hypertension-repeat BP looks fantastic. Will continue to follow carefullyy.

## 2014-07-07 ENCOUNTER — Other Ambulatory Visit: Payer: Self-pay | Admitting: Family Medicine

## 2014-07-07 ENCOUNTER — Other Ambulatory Visit: Payer: Self-pay | Admitting: Physician Assistant

## 2014-08-04 ENCOUNTER — Other Ambulatory Visit: Payer: Self-pay | Admitting: *Deleted

## 2014-08-04 MED ORDER — LORAZEPAM 1 MG PO TABS: 1.0000 mg | ORAL_TABLET | Freq: Every day | ORAL | Status: DC | PRN

## 2014-08-29 DIAGNOSIS — I10 Essential (primary) hypertension: Secondary | ICD-10-CM | POA: Diagnosis not present

## 2014-08-29 DIAGNOSIS — J441 Chronic obstructive pulmonary disease with (acute) exacerbation: Secondary | ICD-10-CM | POA: Diagnosis not present

## 2014-09-06 ENCOUNTER — Encounter: Payer: Self-pay | Admitting: Family Medicine

## 2014-09-06 ENCOUNTER — Ambulatory Visit (INDEPENDENT_AMBULATORY_CARE_PROVIDER_SITE_OTHER): Payer: Medicare Other | Admitting: Family Medicine

## 2014-09-06 VITALS — BP 98/76 | HR 75 | Wt 88.0 lb

## 2014-09-06 DIAGNOSIS — J441 Chronic obstructive pulmonary disease with (acute) exacerbation: Secondary | ICD-10-CM | POA: Diagnosis not present

## 2014-09-06 DIAGNOSIS — J411 Mucopurulent chronic bronchitis: Secondary | ICD-10-CM

## 2014-09-06 DIAGNOSIS — R002 Palpitations: Secondary | ICD-10-CM | POA: Diagnosis not present

## 2014-09-06 MED ORDER — METHYLPREDNISOLONE ACETATE 80 MG/ML IJ SUSP
80.0000 mg | Freq: Once | INTRAMUSCULAR | Status: AC
  Administered 2014-09-06: 80 mg via INTRAMUSCULAR

## 2014-09-06 NOTE — Progress Notes (Signed)
   Subjective:    Patient ID: Tamara Adams, female    DOB: [birth year], 74 y.o.   MRN: [id number]  HPI COPD - was seen for exacerbation. Givne Levaquin and prednisone. Stil on abx. Says her chest stil feels really tight and cough.  Fever has resolved. Has a lot of phlegm.  feeel like her heart has been skipping as well.  Notices it more at night when laying down but can happen during day as well. Nasal congestion is better. CXR was OK done there.    Review of Systems     Objective:asal conge   Physical Exam  Constitutional: She is oriented to person, place, and time. She appears well-developed and well-nourished.  HENT:  Head: Normocephalic and atraumatic.  Right Ear: External ear normal.  Left Ear: External ear normal.  Nose: Nose normal.  Mouth/Throat: Oropharynx is clear and moist.  TMs and canals are clear.   Eyes: Conjunctivae and EOM are normal. Pupils are equal, round, and reactive to light.  Neck: Neck supple. No thyromegaly present.  Cardiovascular: Normal rate, regular rhythm and normal heart sounds.   Pulmonary/Chest: Effort normal and breath sounds normal. She has no wheezes.  Dec BS bilat.  Increased "e" sound on the right compared to the left.   Lymphadenopathy:    She has no cervical adenopathy.  Neurological: She is alert and oriented to person, place, and time.  Skin: Skin is warm and dry.  Psychiatric: She has a normal mood and affect.          Assessment & Plan:  COPD exacerbation.- Given neb here. Pulse ox down some. She usually is aorund 95-96%. Depomedrol given here.  Complete Levaquin. Increase nebs to Q2 hours.  Cal lif gets worse or not improving in 2-3 days.  Palpitations- EKG shows rate of 80 bpm, NSR, normal axil. No acute ST-T wave changes.  Suspect bilatrial enlargement.  Consider event monitor.

## 2014-09-06 NOTE — Patient Instructions (Signed)
Increase Nebs to every 4 hours.

## 2014-09-07 ENCOUNTER — Encounter: Payer: Self-pay | Admitting: Family Medicine

## 2014-09-08 ENCOUNTER — Ambulatory Visit (INDEPENDENT_AMBULATORY_CARE_PROVIDER_SITE_OTHER): Payer: Medicare Other | Admitting: Gastroenterology

## 2014-09-08 ENCOUNTER — Encounter: Payer: Self-pay | Admitting: Gastroenterology

## 2014-09-08 VITALS — BP 158/68 | HR 88 | Ht 64.0 in

## 2014-09-08 DIAGNOSIS — R140 Abdominal distension (gaseous): Secondary | ICD-10-CM

## 2014-09-08 DIAGNOSIS — K5900 Constipation, unspecified: Secondary | ICD-10-CM | POA: Diagnosis not present

## 2014-09-08 DIAGNOSIS — R1030 Lower abdominal pain, unspecified: Secondary | ICD-10-CM

## 2014-09-08 NOTE — Patient Instructions (Signed)
You can take Miralax over the counter twice daily and can titrate up depending on your bowel movements.   You follow up with Dr. Fuller Plan is on 10/08/14 at 2:45pm. If you need to reschedule or cancel please call [phone].  Thank you for choosing me and [facility].  Pricilla Riffle. Dagoberto Ligas., MD., [facility] Regal

## 2014-09-08 NOTE — Progress Notes (Signed)
History of Present Illness: This is a 74 year old female referred by Dr. Minna Merritts for evaluation of lower abdominal pain and bloating. She has been a patient of Dr. Eusebio Friendly at [facility] and has undergone extensive evaluation over the past 3 years including upper endoscopy, colonoscopy, CT scan and small bowel follow-through. She has a history of antral ulcers and has been treated with omeprazole. Small adenomatous colon polyps removed in March 2013. She's been treated with couple courses of Xifaxan for possible SIBO without improvement in symptoms. She has chronic constipation and states she has incomplete stool evacuation. She takes MiraLAX daily and milk of magnesia as needed. Hospitalized with a small bowel obstruction last year. Denies weight loss, diarrhea, change in stool caliber, melena, hematochezia, nausea, vomiting, dysphagia, reflux symptoms, chest pain.  Allergies  Allergen Reactions  . Maxidex [Dexamethasone] Other (See Comments)    dehydration  . Advair Diskus [Fluticasone-Salmeterol]     No benefit with lungs  . Trazodone And Nefazodone Other (See Comments)    Heart pounding  . Tylox [Oxycodone-Acetaminophen] Itching   Outpatient Prescriptions Prior to Visit  Medication Sig Dispense Refill  . albuterol (PROVENTIL) (2.5 MG/3ML) 0.083% nebulizer solution USE 3 ML (2.5 MG TOTAL) BY NEBULIZATION EVERY 6 HOURS AS NEEDED FOR WHEEZING OR SHORTNESS OF BREATH 270 vial 1  . albuterol (VENTOLIN HFA) 108 (90 BASE) MCG/ACT inhaler Inhale 2 puffs into the lungs every 6 (six) hours as needed. 2-4 puffs inhaled every 4-6 hours 3 Inhaler 3  . amLODipine-benazepril (LOTREL) 5-40 MG per capsule TAKE 1 CAPSULE DAILY 90 capsule 0  . BREO ELLIPTA 100-25 MCG/INH AEPB USE 1 INHALATION DAILY 3 each 0  . LORazepam (ATIVAN) 1 MG tablet Take 1 tablet (1 mg total) by mouth daily as needed for anxiety or sleep. 30 tablet 1  . Multiple Vitamin (MULTIVITAMIN WITH MINERALS) TABS tablet  Take 1 tablet by mouth daily. Pt uses One-A-Day brand    . omeprazole (PRILOSEC) 40 MG capsule Take 40 mg by mouth daily.    . ondansetron (ZOFRAN-ODT) 4 MG disintegrating tablet Take 1 tablet (4 mg total) by mouth every 8 (eight) hours as needed for nausea. 20 tablet 1  . tiotropium (SPIRIVA HANDIHALER) 18 MCG inhalation capsule Use 2 inhalations from 1 capsule only once daily 90 capsule 3  . alendronate (FOSAMAX) 70 MG tablet Take 1 tablet (70 mg total) by mouth once a week. Take with a full glass of water on an empty stomach. 12 tablet 3  . AMBULATORY NON FORMULARY MEDICATION Medication Name:nebulizer  Machine with equpiment Dx.J44.9 COPD  To be used as needed for Wheezing or shortness of breath Fax to [facility]:[id number] 1 Units 0  . pravastatin (PRAVACHOL) 40 MG tablet Take 1 tablet (40 mg total) by mouth at bedtime. 90 tablet 0  . traZODone (DESYREL) 50 MG tablet Take 0.5-1.5 tablets (25-75 mg total) by mouth at bedtime as needed for sleep. 60 tablet 0   No facility-administered medications prior to visit.   Past Medical History  Diagnosis Date  . COPD (chronic obstructive pulmonary disease)     FVC 72%, FEV1 29%, FEV1 ratio 32% (very severe (COPD)  . Hypertension   . Pneumonia   . DVT (deep venous thrombosis)     "years ago after a surgery"  . Pinched nerve     in back  . Peptic ulcer   . Tubular adenoma of colon 09/2011  . H/O hiatal hernia   . Degenerative disc  disease   . Anemia     as a child  . Kidney stone   . Diverticulosis   . Hyperlipidemia   . Bowel obstruction 2014   Past Surgical History  Procedure Laterality Date  . Abdominal hysterectomy  1987    and one ovary  . Oophorectomy  1992  . Cataract extraction w/phaco  06/12/2012    Dr. Boykin Reaper, left eye  . Cataract extraction  06/22/2012    Right eye, Dr. Lester Kinsman.   Marland Kitchen Urethra surgery    . Small intestine surgery      for a blockage, no bowel was removed, just kinked from scar tissue  . Cholecystectomy  N/A 08/18/2013    Procedure: LAPAROSCOPIC CHOLECYSTECTOMY;  Surgeon: Harl Bowie, MD;  Location: [place];  Service: General;  Laterality: N/A;   History   Social History  . Marital Status: Married    Spouse Name: N/A  . Number of Children: 3  . Years of Education: N/A   Occupational History  . retired.      Social History Main Topics  . Smoking status: Current Every Day Smoker -- 0.25 packs/day for 60 years    Types: Cigarettes  . Smokeless tobacco: Never Used     Comment: Using Electronic cigarettes  . Alcohol Use: No  . Drug Use: No  . Sexual Activity: Not on file   Other Topics Concern  . None   Social History Narrative   No regular exercise.    Family History  Problem Relation Age of Onset  . Hypertension Mother   . Ovarian cancer Mother   . Colon cancer Neg Hx   . Colon polyps Neg Hx   . Diabetes Neg Hx   . Kidney disease Neg Hx   . Gallbladder disease Neg Hx   . Esophageal cancer Neg Hx       Review of Systems: Pertinent positive and negative review of systems were noted in the above HPI section. All other review of systems were otherwise negative.  Physical Exam: General: Well developed, well nourished, thin, no acute distress Head: Normocephalic and atraumatic Eyes:  sclerae anicteric, EOMI Ears: Normal auditory acuity Mouth: No deformity or lesions Neck: Supple, no masses or thyromegaly Lungs: Clear throughout to auscultation Heart: Regular rate and rhythm; no murmurs, rubs or bruits Abdomen: Soft, non tender and non distended. No masses, hepatosplenomegaly or hernias noted. Normal Bowel sounds Musculoskeletal: Symmetrical with no gross deformities  Skin: No lesions on visible extremities Pulses:  Normal pulses noted Extremities: No clubbing, cyanosis, edema or deformities noted Neurological: Alert oriented x 4, grossly nonfocal Cervical Nodes:  No significant cervical adenopathy Inguinal Nodes: No significant inguinal  adenopathy Psychological:  Alert and cooperative. Normal mood and affect  Assessment and Recommendations:  1. Lower abdominal pain, bloating and constipation. Increase MiraLAX to 2 or 3 times daily titrated for adequate bowel movements. Milk of magnesia only if above is ineffective. Consider a trial of Linzess if above is not helpful. Return office visit one month.  2. Personal history of adenomatous colon polyps. Tentatively plan on surveillance colonoscopy in March 2018 however with her oxygen dependent COPD and her age we will reconsider this decision in 2 years.  3. History of antral ulcers. Continue omeprazole 40 mg daily.  4. History of small bowel obstruction. Presumably secondary to adhesions.  cc: [name], [address] [place], [place] [address]

## 2014-09-14 ENCOUNTER — Encounter: Payer: Self-pay | Admitting: Family Medicine

## 2014-09-14 ENCOUNTER — Ambulatory Visit (INDEPENDENT_AMBULATORY_CARE_PROVIDER_SITE_OTHER): Payer: Medicare Other | Admitting: Family Medicine

## 2014-09-14 VITALS — BP 153/72 | HR 78 | Wt 98.0 lb

## 2014-09-14 DIAGNOSIS — J411 Mucopurulent chronic bronchitis: Secondary | ICD-10-CM | POA: Diagnosis not present

## 2014-09-14 NOTE — Progress Notes (Signed)
   Subjective:    Patient ID: Tamara Adams, female    DOB: 30-Dec-1940, 74 y.o.   MRN: [id number]  HPI COPD exacerbation - finished ABX about a wek ago. Still get a lot of sputm, that is white.  Still some SOB but no CP. On spiriva.  Using albuterol evey 4 hours.  Using 3 Liter of oxygen. Though feels better today than has in a couple of weeks.  She is not wearing it currently. She says it is sitting in her car.    Review of Systems     Objective:   Physical Exam  Constitutional: She is oriented to person, place, and time. She appears well-developed and well-nourished.  HENT:  Head: Normocephalic and atraumatic.  Cardiovascular: Normal rate, regular rhythm and normal heart sounds.   Pulmonary/Chest: Effort normal and breath sounds normal.  Neurological: She is alert and oriented to person, place, and time.  Skin: Skin is warm and dry.  Psychiatric: She has a normal mood and affect. Her behavior is normal.          Assessment & Plan:  COPD - she is improving some. She's not progressed quite as far as I would like. She still getting a fair amount of sputum production but has been white with no discoloration. Encouraged her to give it another week or 2 and see if she continues to improve. If not then we can always: Another course of prednisone. She is also call if she notices a change in sputum color etc. She is Re: On Spiriva and pre-oh. Try to re-wean albuterol some as she is feeling better.

## 2014-09-15 ENCOUNTER — Encounter: Payer: Self-pay | Admitting: Family Medicine

## 2014-09-15 NOTE — Addendum Note (Signed)
Addended by: Beatris Ship L on: 09/15/2014 04:04 PM   Modules accepted: Orders

## 2014-09-16 ENCOUNTER — Other Ambulatory Visit: Payer: Self-pay | Admitting: *Deleted

## 2014-09-16 MED ORDER — ONDANSETRON 4 MG PO TBDP: 4.0000 mg | ORAL_TABLET | Freq: Three times a day (TID) | ORAL | Status: DC | PRN

## 2014-09-20 ENCOUNTER — Telehealth: Payer: Self-pay | Admitting: Physician Assistant

## 2014-09-20 NOTE — Telephone Encounter (Signed)
-----   Message from Donella Stade, [place] sent at 02/05/2014 12:36 PM EDT ----- CT abd and pelvis with contrast to follow up on liver lesions

## 2014-09-20 NOTE — Telephone Encounter (Signed)
I have a reminder to get CT abd and pelvis to follow up on liver lesions. Would she like for [place] to schedule.

## 2014-09-23 ENCOUNTER — Telehealth: Payer: Self-pay | Admitting: *Deleted

## 2014-09-23 DIAGNOSIS — K769 Liver disease, unspecified: Secondary | ICD-10-CM

## 2014-09-23 NOTE — Telephone Encounter (Signed)
CT adb pelis wiwth cm ordered for 6 month f/u for liver lesions.  Pt aware.

## 2014-09-23 NOTE — Telephone Encounter (Signed)
Pt ok with ct.  Order placed for [facility] due to age & ins.  Will get PA.

## 2014-09-24 ENCOUNTER — Telehealth: Payer: Self-pay | Admitting: *Deleted

## 2014-09-24 MED ORDER — DOXYCYCLINE HYCLATE 100 MG PO TABS: 100.0000 mg | ORAL_TABLET | Freq: Two times a day (BID) | ORAL | Status: DC

## 2014-09-24 MED ORDER — PREDNISONE 20 MG PO TABS: 40.0000 mg | ORAL_TABLET | Freq: Every day | ORAL | Status: DC

## 2014-09-24 NOTE — Telephone Encounter (Signed)
Patient called in this morning requesting another round of antibiotics & prednisone.

## 2014-09-24 NOTE — Addendum Note (Signed)
Addended by: Beatrice Lecher D on: 09/24/2014 11:51 AM   Modules accepted: Orders

## 2014-09-24 NOTE — Telephone Encounter (Signed)
Called patient @ 5pm to inform her the scripts have been sent

## 2014-09-24 NOTE — Telephone Encounter (Signed)
Scripts sent

## 2014-09-28 ENCOUNTER — Telehealth: Payer: Self-pay | Admitting: *Deleted

## 2014-09-28 DIAGNOSIS — K7689 Other specified diseases of liver: Secondary | ICD-10-CM | POA: Diagnosis not present

## 2014-09-28 DIAGNOSIS — K769 Liver disease, unspecified: Secondary | ICD-10-CM

## 2014-09-28 NOTE — Telephone Encounter (Signed)
Labs ordered for CT with contrast

## 2014-09-30 LAB — BASIC METABOLIC PANEL WITH GFR
BUN: 12 mg/dL (ref 6–23)
CALCIUM: 9.3 mg/dL (ref 8.4–10.5)
CHLORIDE: 97 meq/L (ref 96–112)
CO2: 27 mEq/L (ref 19–32)
CREATININE: 0.73 mg/dL (ref 0.50–1.10)
GFR, Est African American: >89 mL/min
GFR, Est Non African American: 82 mL/min
GLUCOSE: 86 mg/dL (ref 70–99)
POTASSIUM: 3.8 meq/L (ref 3.5–5.3)
SODIUM: 138 meq/L (ref 135–145)

## 2014-09-30 NOTE — Telephone Encounter (Signed)
Quick Note:  All labs are normal. ______ 

## 2014-10-01 ENCOUNTER — Other Ambulatory Visit: Payer: Self-pay | Admitting: *Deleted

## 2014-10-01 DIAGNOSIS — Z23 Encounter for immunization: Secondary | ICD-10-CM

## 2014-10-04 ENCOUNTER — Ambulatory Visit (HOSPITAL_BASED_OUTPATIENT_CLINIC_OR_DEPARTMENT_OTHER)
Admission: RE | Admit: 2014-10-04 | Discharge: 2014-10-04 | Disposition: A | Discharge status: Home / Self Care | Payer: Medicare Other | Source: Ambulatory Visit | Attending: Family Medicine | Admitting: Family Medicine

## 2014-10-04 ENCOUNTER — Other Ambulatory Visit: Payer: Self-pay | Admitting: Family Medicine

## 2014-10-04 ENCOUNTER — Encounter (HOSPITAL_BASED_OUTPATIENT_CLINIC_OR_DEPARTMENT_OTHER): Payer: Self-pay

## 2014-10-04 DIAGNOSIS — K769 Liver disease, unspecified: Secondary | ICD-10-CM | POA: Diagnosis present

## 2014-10-04 DIAGNOSIS — I700 Atherosclerosis of aorta: Secondary | ICD-10-CM | POA: Insufficient documentation

## 2014-10-04 MED ORDER — IOHEXOL 300 MG/ML  SOLN: 100.0000 mL | Freq: Once | INTRAMUSCULAR | Status: DC | PRN

## 2014-10-05 ENCOUNTER — Other Ambulatory Visit: Payer: Self-pay | Admitting: Family Medicine

## 2014-10-05 ENCOUNTER — Encounter: Payer: Self-pay | Admitting: Family Medicine

## 2014-10-05 DIAGNOSIS — I700 Atherosclerosis of aorta: Secondary | ICD-10-CM | POA: Insufficient documentation

## 2014-10-05 MED ORDER — PRAVASTATIN SODIUM 40 MG PO TABS: 40.0000 mg | ORAL_TABLET | Freq: Every day | ORAL | Status: DC

## 2014-10-07 ENCOUNTER — Other Ambulatory Visit: Payer: Self-pay | Admitting: Physician Assistant

## 2014-10-08 ENCOUNTER — Ambulatory Visit (INDEPENDENT_AMBULATORY_CARE_PROVIDER_SITE_OTHER): Payer: Medicare Other | Admitting: Gastroenterology

## 2014-10-08 ENCOUNTER — Encounter: Payer: Self-pay | Admitting: Gastroenterology

## 2014-10-08 VITALS — BP 140/70 | HR 94 | Ht 64.0 in | Wt 90.2 lb

## 2014-10-08 DIAGNOSIS — R1030 Lower abdominal pain, unspecified: Secondary | ICD-10-CM

## 2014-10-08 DIAGNOSIS — K5901 Slow transit constipation: Secondary | ICD-10-CM

## 2014-10-08 MED ORDER — LINACLOTIDE 290 MCG PO CAPS: 290.0000 ug | ORAL_CAPSULE | Freq: Every day | ORAL | Status: DC

## 2014-10-08 NOTE — Progress Notes (Signed)
History of Present Illness: This is a 74 year old female returning for follow-up of constipation. MiraLAX 2-3 times daily on a regular basis was not effective in relieving her constipation. She still has days without a bowel movement in days with multiple small bowel movements. She had a persistent sensation of incomplete evacuation, lower abdominal discomfort and bloating. Abd/pelvic CT scan without contrast performed on 10/04/14 for follow-up of hepatic lesions showed the following:  IMPRESSION: 1. Previously identified indeterminate hepatic lesions appear morphologically unchanged since most remote contrast-enhanced examination performed 08/15/2013 as well as most remote noncontrast examination performed 04/2013. While incompletely characterized on the present noncontrast examination, morphologically stability since 04/2013 would be suggestive of a benign etiology. As such, follow-up CT or MRI in 6 months (preferably with contrast) in 6 months is recommended for continued stability and/or further characterization. 2. No definitive new hepatic lesions on this noncontrast examination. 3. Large colonic stool burden without evidence of enteric  obstruction. 4. Large amount of calcified atherosclerotic plaque within a tortuous but normal caliber abdominal aorta.   Allergies  Allergen Reactions  . Maxidex [Dexamethasone] Other (See Comments)    dehydration  . Advair Diskus [Fluticasone-Salmeterol]     No benefit with lungs  . Trazodone And Nefazodone Other (See Comments)    Heart pounding  . Tylox [Oxycodone-Acetaminophen] Itching   Outpatient Prescriptions Prior to Visit  Medication Sig Dispense Refill  . albuterol (PROVENTIL) (2.5 MG/3ML) 0.083% nebulizer solution USE 3 ML (2.5 MG TOTAL) BY NEBULIZATION EVERY 6 HOURS AS NEEDED FOR WHEEZING OR SHORTNESS OF BREATH 270 vial 1  . albuterol (VENTOLIN HFA) 108 (90 BASE) MCG/ACT inhaler Inhale 2 puffs into the lungs every 6 (six) hours as needed.  2-4 puffs inhaled every 4-6 hours 3 Inhaler 3  . amLODipine-benazepril (LOTREL) 5-40 MG per capsule TAKE 1 CAPSULE DAILY 90 capsule 0  . doxycycline (VIBRA-TABS) 100 MG tablet Take 1 tablet (100 mg total) by mouth 2 (two) times daily. 20 tablet 0  . ENSURE PLUS (ENSURE PLUS) LIQD Take 2 Cans by mouth daily. For weight gain    . LORazepam (ATIVAN) 1 MG tablet Take 1 tablet (1 mg total) by mouth daily as needed for anxiety or sleep. 30 tablet 1  . Multiple Vitamin (MULTIVITAMIN WITH MINERALS) TABS tablet Take 1 tablet by mouth daily. Pt uses One-A-Day brand    . omeprazole (PRILOSEC) 40 MG capsule Take 40 mg by mouth daily.    . ondansetron (ZOFRAN-ODT) 4 MG disintegrating tablet Take 1 tablet (4 mg total) by mouth every 8 (eight) hours as needed for nausea. 20 tablet 1  . OXYGEN Inhale 2-3 Units into the lungs daily.    . pravastatin (PRAVACHOL) 40 MG tablet Take 1 tablet (40 mg total) by mouth at bedtime. 30 tablet 11  . predniSONE (DELTASONE) 20 MG tablet Take 2 tablets (40 mg total) by mouth daily. 10 tablet 0  . tiotropium (SPIRIVA HANDIHALER) 18 MCG inhalation capsule Use 2 inhalations from 1 capsule only once daily 90 capsule 3   No facility-administered medications prior to visit.   Past Medical History  Diagnosis Date  . COPD (chronic obstructive pulmonary disease)     FVC 72%, FEV1 29%, FEV1 ratio 32% (very severe (COPD)  . Hypertension   . Pneumonia   . DVT (deep venous thrombosis)     "years ago after a surgery"  . Pinched nerve     in back  . Peptic ulcer   . Tubular adenoma of colon 09/2011  .  H/O hiatal hernia   . Degenerative disc disease   . Anemia     as a child  . Kidney stone   . Diverticulosis   . Hyperlipidemia   . Bowel obstruction 2014   Past Surgical History  Procedure Laterality Date  . Abdominal hysterectomy  1987    and one ovary  . Oophorectomy  1992  . Cataract extraction w/phaco  06/12/2012    Dr. Boykin Reaper, left eye  . Cataract extraction   06/22/2012    Right eye, Dr. Lester Kinsman.   Marland Kitchen Urethra surgery    . Small intestine surgery      for a blockage, no bowel was removed, just kinked from scar tissue  . Cholecystectomy N/A 08/18/2013    Procedure: LAPAROSCOPIC CHOLECYSTECTOMY;  Surgeon: Harl Bowie, MD;  Location: [facility];  Service: General;  Laterality: N/A;   History   Social History  . Marital Status: Married    Spouse Name: N/A  . Number of Children: 3  . Years of Education: N/A   Occupational History  . retired.      Social History Main Topics  . Smoking status: Current Every Day Smoker -- 0.25 packs/day for 60 years    Types: Cigarettes  . Smokeless tobacco: Never Used     Comment: Using Electronic cigarettes  . Alcohol Use: No  . Drug Use: No  . Sexual Activity: Not on file   Other Topics Concern  . None   Social History Narrative   No regular exercise.    Family History  Problem Relation Age of Onset  . Hypertension Mother   . Ovarian cancer Mother   . Colon cancer Neg Hx   . Colon polyps Neg Hx   . Diabetes Neg Hx   . Kidney disease Neg Hx   . Gallbladder disease Neg Hx   . Esophageal cancer Neg Hx      Physical Exam: General: Well developed , well nourished, no acute distress Head: Normocephalic and atraumatic Eyes:  sclerae anicteric, EOMI Ears: Normal auditory acuity Mouth: No deformity or lesions Lungs: Clear throughout to auscultation Heart: Regular rate and rhythm; no murmurs, rubs or bruits Abdomen: Soft, non tender and non distended. No masses, hepatosplenomegaly or hernias noted. Normal Bowel sounds Musculoskeletal: Symmetrical with no gross deformities  Pulses:  Normal pulses noted Extremities: No clubbing, cyanosis, edema or deformities noted Neurological: Alert oriented x 4, grossly nonfocal Psychological:  Alert and cooperative. Normal mood and affect  Assessment and Recommendations:  1. Chronic constipation, incomplete evacuation, lower abdominal pain. Large colonic  stool burden on CT. DC Miralax. High fiber diet, Benefiber daily, 8 glasses of water daily. Linsezz 290 mcg daily. Follow-up office visit in 6-8 weeks. Patient is advised to call if her symptoms are not under good control in 3-4 weeks.  2. 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 This problem is being followed by Dr. Madilyn Fireman. Follow-up abd CT scan with contrast or abd MRI with contrast was recommended in 6 months.

## 2014-10-08 NOTE — Patient Instructions (Signed)
We have sent the following medications to your pharmacy for you to pick up at your convenience:Linzess.  Start over the [employer] once daily.   Your follow up appointment is scheduled with Dr. Fuller Plan on 11/26/14 at 11:15am. If you need to reschedule or cancel please call [phone].  Please call back in 3-4 week if you do not see any progress with these medications.   Thank you for choosing me and [facility].  Pricilla Riffle. Dagoberto Ligas., MD., [facility] Regal

## 2014-10-12 ENCOUNTER — Ambulatory Visit: Payer: Medicare Other | Admitting: Family Medicine

## 2014-10-14 ENCOUNTER — Encounter: Payer: Self-pay | Admitting: Family Medicine

## 2014-10-14 ENCOUNTER — Ambulatory Visit (INDEPENDENT_AMBULATORY_CARE_PROVIDER_SITE_OTHER): Payer: Medicare Other | Admitting: Family Medicine

## 2014-10-14 VITALS — BP 122/82 | HR 106 | Ht 64.0 in | Wt 88.0 lb

## 2014-10-14 DIAGNOSIS — K5901 Slow transit constipation: Secondary | ICD-10-CM

## 2014-10-14 DIAGNOSIS — J411 Mucopurulent chronic bronchitis: Secondary | ICD-10-CM

## 2014-10-14 DIAGNOSIS — R002 Palpitations: Secondary | ICD-10-CM

## 2014-10-14 MED ORDER — LORAZEPAM 1 MG PO TABS: 1.0000 mg | ORAL_TABLET | Freq: Every day | ORAL | Status: DC | PRN

## 2014-10-14 MED ORDER — METOPROLOL SUCCINATE ER 25 MG PO TB24: 25.0000 mg | ORAL_TABLET | Freq: Every day | ORAL | Status: DC

## 2014-10-14 MED ORDER — FLUTICASONE FUROATE-VILANTEROL 100-25 MCG/INH IN AEPB: 1.0000 | INHALATION_SPRAY | Freq: Every day | RESPIRATORY_TRACT | Status: DC

## 2014-10-14 NOTE — Progress Notes (Signed)
   Subjective:    Patient ID: Tamara Adams, female    DOB: Nov 27, 1940, 74 y.o.   MRN: [id number]  HPI  COPD - she is stable. Just used her Spiriva daily.  Still has a chronic cough.    Slow transit constipation - Started on Linzess for constipatoin by GI. Says is working well, In fact has been having frequent loose stools.    Says still feeling heart flutter.  Can happen multiple times a day. Can last a few seconds. Had a cardiac monitor about 2-3 years with Dr. Stanford Breed for same symptoms. At that time they eventually went away on their own. . Says this feels the same. BMP was normal about 3 weeks ago.  She does have low iron but her ferritin has been rising well taking iron supplementation.   Review of Systems     Objective:   Physical Exam  Constitutional: She is oriented to person, place, and time. She appears well-developed and well-nourished.  HENT:  Head: Normocephalic and atraumatic.  Cardiovascular: Normal rate, regular rhythm and normal heart sounds.   Pulmonary/Chest: Effort normal and breath sounds normal.  Neurological: She is alert and oriented to person, place, and time.  Skin: Skin is warm and dry.  Psychiatric: She has a normal mood and affect. Her behavior is normal.          Assessment & Plan:  COPD - Will refill the Breo.  Tried the [place] but wasn't as effective.  Follow-up in 3 months.  Slow transit constipation - doing well on Linzess but havins some loose stool F/U in 2 weeks with GI.    Palpitation- electrolyes are normal. Will try starting betablocker and f/U in 2 months to make sure working well and to recheck blood pressure..  Lab Results  Component Value Date   TSH 1.163 05/03/2014   Iron deficiency-due to recheck ferritin at follow-up visit.

## 2014-10-25 ENCOUNTER — Other Ambulatory Visit: Payer: Self-pay | Admitting: Family Medicine

## 2014-11-11 DIAGNOSIS — H43813 Vitreous degeneration, bilateral: Secondary | ICD-10-CM | POA: Diagnosis not present

## 2014-11-16 DIAGNOSIS — R000 Tachycardia, unspecified: Secondary | ICD-10-CM | POA: Diagnosis not present

## 2014-11-16 DIAGNOSIS — F17210 Nicotine dependence, cigarettes, uncomplicated: Secondary | ICD-10-CM | POA: Diagnosis not present

## 2014-11-16 DIAGNOSIS — R109 Unspecified abdominal pain: Secondary | ICD-10-CM | POA: Diagnosis not present

## 2014-11-16 DIAGNOSIS — Z9981 Dependence on supplemental oxygen: Secondary | ICD-10-CM | POA: Diagnosis not present

## 2014-11-16 DIAGNOSIS — Z7951 Long term (current) use of inhaled steroids: Secondary | ICD-10-CM | POA: Diagnosis not present

## 2014-11-16 DIAGNOSIS — D72829 Elevated white blood cell count, unspecified: Secondary | ICD-10-CM | POA: Diagnosis not present

## 2014-11-16 DIAGNOSIS — I10 Essential (primary) hypertension: Secondary | ICD-10-CM | POA: Diagnosis not present

## 2014-11-16 DIAGNOSIS — J449 Chronic obstructive pulmonary disease, unspecified: Secondary | ICD-10-CM | POA: Diagnosis not present

## 2014-11-16 DIAGNOSIS — Z885 Allergy status to narcotic agent status: Secondary | ICD-10-CM | POA: Diagnosis not present

## 2014-11-16 DIAGNOSIS — R0602 Shortness of breath: Secondary | ICD-10-CM | POA: Diagnosis not present

## 2014-11-16 DIAGNOSIS — Z888 Allergy status to other drugs, medicaments and biological substances status: Secondary | ICD-10-CM | POA: Diagnosis not present

## 2014-11-16 DIAGNOSIS — Z79899 Other long term (current) drug therapy: Secondary | ICD-10-CM | POA: Diagnosis not present

## 2014-11-16 DIAGNOSIS — R05 Cough: Secondary | ICD-10-CM | POA: Diagnosis not present

## 2014-11-16 DIAGNOSIS — K5669 Other intestinal obstruction: Secondary | ICD-10-CM | POA: Diagnosis not present

## 2014-11-16 DIAGNOSIS — Z7952 Long term (current) use of systemic steroids: Secondary | ICD-10-CM | POA: Diagnosis not present

## 2014-11-17 DIAGNOSIS — R109 Unspecified abdominal pain: Secondary | ICD-10-CM | POA: Diagnosis not present

## 2014-11-19 ENCOUNTER — Telehealth: Payer: Self-pay | Admitting: *Deleted

## 2014-11-19 NOTE — Telephone Encounter (Signed)
Records faxed.Audelia Hives [place]

## 2014-11-23 ENCOUNTER — Other Ambulatory Visit: Payer: Self-pay | Admitting: Family Medicine

## 2014-11-23 MED ORDER — METOPROLOL SUCCINATE ER 25 MG PO TB24: 25.0000 mg | ORAL_TABLET | Freq: Every day | ORAL | Status: DC

## 2014-11-26 ENCOUNTER — Encounter: Payer: Self-pay | Admitting: Gastroenterology

## 2014-11-26 ENCOUNTER — Ambulatory Visit (INDEPENDENT_AMBULATORY_CARE_PROVIDER_SITE_OTHER): Payer: Medicare Other | Admitting: Gastroenterology

## 2014-11-26 VITALS — BP 130/62 | HR 88 | Ht 64.0 in | Wt 90.5 lb

## 2014-11-26 DIAGNOSIS — R1030 Lower abdominal pain, unspecified: Secondary | ICD-10-CM

## 2014-11-26 DIAGNOSIS — K5669 Other intestinal obstruction: Secondary | ICD-10-CM

## 2014-11-26 DIAGNOSIS — K5900 Constipation, unspecified: Secondary | ICD-10-CM

## 2014-11-26 DIAGNOSIS — K56609 Unspecified intestinal obstruction, unspecified as to partial versus complete obstruction: Secondary | ICD-10-CM

## 2014-11-26 NOTE — Progress Notes (Signed)
    History of Present Illness: This is a 74 year old female returning for follow-up of constipation. She relates having 10-15 small loose bowel movements each day. She has persistent mild lower abdominal pain. She had an episode of increased abdominal pain associated with nausea and vomiting and she was evaluated in the [facility] ED on 11/16/14. Records in [place] for that ED visit were reviewed in detail. CT scan obtained with results below. Blood work remarkable for lipase=81, WBC=13.6, gluc=118. Essentially she was diagnosed with a partial small bowel obstruction and was discharged home. CT did not show a large stool burden. Symptoms gradually resolved over the next 2-3 days and the vomiting abated. Her appetite remains only fair fair oral intake. She states she's having 10-15 loose stools per day she has persistent lower abdominal pain.    CT IMPRESSION:  Mildly dilated proximal small bowel loops consistent with a low-grade or partial small bowel obstruction as contrast passes easily and is seen in the cecum. Unchanged, likely benign hepatic lesions. Hyperinflated lungs suggesting emphysema.  Current Medications, Allergies, Past Medical History, Past Surgical History, Family History and Social History were reviewed in [employer] record.  Physical Exam: General: Well developed, elderly, frail, thin, no acute distress Head: Normocephalic and atraumatic Eyes:  sclerae anicteric, EOMI Ears: Normal auditory acuity Mouth: No deformity or lesions Lungs: Clear throughout to auscultation Heart: Regular rate and rhythm; no murmurs, rubs or bruits Abdomen: Soft, minimal lower abdominal tenderness without rebound or guarding and non distended. No masses, hepatosplenomegaly or hernias noted. Normal Bowel sounds Musculoskeletal: Symmetrical with no gross deformities  Pulses:  Normal pulses noted Extremities: No clubbing, cyanosis, edema or deformities  noted Neurological: Alert oriented x 4, grossly nonfocal Psychological:  Alert and cooperative. Normal mood and affect  Assessment and Recommendations:  1. Chronic constipation, incomplete evacuation, lower abdominal pain. Constipation appears to be overtreated at this point. Continue high fiber diet, Benefiber daily, 8 glasses of water daily, Linzess 290 mcg daily. DC stool softener and change Miralax to once daily as needed. REV in 6-8 weeks. If her diarrhea does not improve over the next 1-2 weeks will plan to decrease Linzess to 145 mcg daily.   2. Recent partial small bowel obstruction likely secondary to adhesions has resolved. This is a recurrent problem. Schedule small bowel follow-through for further evaluation.

## 2014-11-26 NOTE — Patient Instructions (Signed)
You have been scheduled for a small bowel study at [facility] (radiology department) on 11/29/14 at 10:00am. Please arrive at 9:45am. Nothing to eat or drink after midnight. If you need to reschedule or cancel please call the radiology department at [phone].  Stop taking your stool softener and decrease your Miralax to once daily.   Please call our office back in 2 weeks if your symptoms are no better.   Your follow up appointment with Dr. Fuller Plan is on 01/17/15 at 9:45am. If you need to reschedule or cancel please call our office at [phone].  Thank you for choosing me and [facility].  Pricilla Riffle. Dagoberto Ligas., MD., Marval Regal

## 2014-11-29 ENCOUNTER — Ambulatory Visit (HOSPITAL_COMMUNITY)
Admission: RE | Admit: 2014-11-29 | Discharge: 2014-11-29 | Disposition: A | Discharge status: Home / Self Care | Payer: Medicare Other | Source: Ambulatory Visit | Attending: Gastroenterology | Admitting: Gastroenterology

## 2014-11-29 DIAGNOSIS — R197 Diarrhea, unspecified: Secondary | ICD-10-CM | POA: Diagnosis not present

## 2014-11-29 DIAGNOSIS — R109 Unspecified abdominal pain: Secondary | ICD-10-CM | POA: Insufficient documentation

## 2014-11-29 DIAGNOSIS — K5900 Constipation, unspecified: Secondary | ICD-10-CM

## 2014-11-29 DIAGNOSIS — K56609 Unspecified intestinal obstruction, unspecified as to partial versus complete obstruction: Secondary | ICD-10-CM

## 2014-12-03 ENCOUNTER — Other Ambulatory Visit: Payer: Self-pay | Admitting: *Deleted

## 2014-12-03 MED ORDER — ONDANSETRON 4 MG PO TBDP: 4.0000 mg | ORAL_TABLET | Freq: Three times a day (TID) | ORAL | Status: DC | PRN

## 2014-12-08 DIAGNOSIS — H3531 Nonexudative age-related macular degeneration: Secondary | ICD-10-CM | POA: Diagnosis not present

## 2014-12-13 ENCOUNTER — Other Ambulatory Visit: Payer: Self-pay | Admitting: Surgery

## 2014-12-13 DIAGNOSIS — K5669 Other intestinal obstruction: Secondary | ICD-10-CM | POA: Diagnosis not present

## 2014-12-14 ENCOUNTER — Telehealth: Payer: Self-pay | Admitting: *Deleted

## 2014-12-15 ENCOUNTER — Encounter: Payer: Self-pay | Admitting: *Deleted

## 2014-12-15 ENCOUNTER — Emergency Department (INDEPENDENT_AMBULATORY_CARE_PROVIDER_SITE_OTHER)
Admission: EM | Admit: 2014-12-15 | Discharge: 2014-12-15 | Disposition: A | Discharge status: Home / Self Care | Payer: Medicare Other | Source: Home / Self Care | Attending: Family Medicine | Admitting: Family Medicine

## 2014-12-15 DIAGNOSIS — J069 Acute upper respiratory infection, unspecified: Secondary | ICD-10-CM

## 2014-12-15 DIAGNOSIS — J9801 Acute bronchospasm: Secondary | ICD-10-CM

## 2014-12-15 DIAGNOSIS — B9789 Other viral agents as the cause of diseases classified elsewhere: Principal | ICD-10-CM

## 2014-12-15 LAB — POCT CBC W AUTO DIFF (K'VILLE URGENT CARE)

## 2014-12-15 MED ORDER — PREDNISONE 20 MG PO TABS: 20.0000 mg | ORAL_TABLET | Freq: Two times a day (BID) | ORAL | Status: DC

## 2014-12-15 MED ORDER — DOXYCYCLINE HYCLATE 100 MG PO CAPS: 100.0000 mg | ORAL_CAPSULE | Freq: Two times a day (BID) | ORAL | Status: DC

## 2014-12-15 NOTE — ED Notes (Signed)
Pt c/o 4 days of productive cough with green sputum, low grade fever, congestion and increased SOB. H/o COPD. 91%02 on RA

## 2014-12-15 NOTE — ED Provider Notes (Signed)
CSN: [id number]     Arrival date & time 12/15/14  1144 History   First MD Initiated Contact with Patient 12/15/14 1225     Chief Complaint  Patient presents with  . URI    HPI Comments: Patient developed sore throat, fatigue, and nasal congestion one week ago.  She developed a non-productive cough 4 days ago, and three days ago she developed fever to 101.5.  She complains of increased shortness of breath and wheezing with activity, and has been using her albuterol MDI and nebulizer more frequently.  She continues to smoke. She is to be scheduled for surgery for bowel obstruction in the near future.  The history is provided by the patient.    Past Medical History  Diagnosis Date  . COPD (chronic obstructive pulmonary disease)     FVC 72%, FEV1 29%, FEV1 ratio 32% (very severe (COPD)  . Hypertension   . Pneumonia   . DVT (deep venous thrombosis)     "years ago after a surgery"  . Pinched nerve     in back  . Peptic ulcer   . Tubular adenoma of colon 09/2011  . H/O hiatal hernia   . Degenerative disc disease   . Anemia     as a child  . Kidney stone   . Diverticulosis   . Hyperlipidemia   . Bowel obstruction 2014   Past Surgical History  Procedure Laterality Date  . Abdominal hysterectomy  1987    and one ovary  . Oophorectomy  1992  . Cataract extraction w/phaco  06/12/2012    Dr. Boykin Reaper, left eye  . Cataract extraction  06/22/2012    Right eye, Dr. Lester Kinsman.   Marland Kitchen Urethra surgery    . Small intestine surgery      for a blockage, no bowel was removed, just kinked from scar tissue  . Cholecystectomy N/A 08/18/2013    Procedure: LAPAROSCOPIC CHOLECYSTECTOMY;  Surgeon: Harl Bowie, MD;  Location: [facility] OR;  Service: General;  Laterality: N/A;   Family History  Problem Relation Age of Onset  . Hypertension Mother   . Ovarian cancer Mother   . Colon cancer Neg Hx   . Colon polyps Neg Hx   . Diabetes Neg Hx   . Kidney disease Neg Hx   . Gallbladder disease Neg  Hx   . Esophageal cancer Neg Hx    History  Substance Use Topics  . Smoking status: Current Every Day Smoker -- 0.25 packs/day for 60 years    Types: Cigarettes  . Smokeless tobacco: Never Used     Comment: Using Electronic cigarettes  . Alcohol Use: No   OB History    No data available     Review of Systems + sore throat + cough No pleuritic pain + wheezing + nasal congestion ? post-nasal drainage No sinus pain/pressure No itchy/red eyes No earache No hemoptysis + SOB with activity + fever, + chills No nausea No vomiting No abdominal pain No diarrhea No urinary symptoms No skin rash + fatigue No myalgias No headache Used OTC meds without relief  Allergies  Maxidex; Advair diskus; Trazodone and nefazodone; and Tylox  Home Medications   Prior to Admission medications   Medication Sig Start Date End Date Taking? Authorizing Provider  albuterol (PROVENTIL) (2.5 MG/3ML) 0.083% nebulizer solution USE 3 ML (2.5 MG TOTAL) BY NEBULIZATION EVERY 6 HOURS AS NEEDED FOR WHEEZING OR SHORTNESS OF BREATH 07/07/14   [name], MD  albuterol (VENTOLIN HFA)  108 (90 BASE) MCG/ACT inhaler Inhale 2 puffs into the lungs every 6 (six) hours as needed. 2-4 puffs inhaled every 4-6 hours 10/22/13   [name], MD  amLODipine-benazepril (LOTREL) 5-40 MG per capsule TAKE 1 CAPSULE DAILY 11/23/14   [name], MD  doxycycline (VIBRAMYCIN) 100 MG capsule Take 1 capsule (100 mg total) by mouth 2 (two) times daily. Take with food. 12/15/14   Kandra Nicolas, MD  ENSURE PLUS (ENSURE PLUS) LIQD Take 2 Cans by mouth daily. For weight gain    Historical Provider, MD  Fluticasone Furoate-Vilanterol 100-25 MCG/INH AEPB Inhale 1 puff into the lungs daily. 10/14/14   [name], MD  Linaclotide Rolan Lipa) 290 MCG CAPS capsule Take 1 capsule (290 mcg total) by mouth daily. 10/08/14   Ladene Artist, MD  LORazepam (ATIVAN) 1 MG tablet Take 1 tablet (1 mg total) by mouth  daily as needed for anxiety or sleep. 10/14/14   [name], MD  metoprolol succinate (TOPROL-XL) 25 MG 24 hr tablet Take 1 tablet (25 mg total) by mouth daily. 11/23/14   [name], MD  Multiple Vitamin (MULTIVITAMIN WITH MINERALS) TABS tablet Take 1 tablet by mouth daily. Pt uses One-A-Day brand    Historical Provider, MD  omeprazole (PRILOSEC) 40 MG capsule Take 40 mg by mouth daily.    Historical Provider, MD  ondansetron (ZOFRAN-ODT) 4 MG disintegrating tablet Take 1 tablet (4 mg total) by mouth every 8 (eight) hours as needed for nausea. 12/03/14   [name], MD  OXYGEN Inhale 2-3 Units into the lungs daily.    Historical Provider, MD  pravastatin (PRAVACHOL) 40 MG tablet Take 1 tablet (40 mg total) by mouth at bedtime. 10/05/14   [name], MD  predniSONE (DELTASONE) 20 MG tablet Take 1 tablet (20 mg total) by mouth 2 (two) times daily. Take with food. 12/15/14   Kandra Nicolas, MD  SPIRIVA HANDIHALER 18 MCG inhalation capsule INHALE THE CONTENTS OF 1 CAPSULE WITH 2 INHALATIONS ONLY ONCE DAILY 10/26/14   [name], MD   BP 153/72 mmHg  Pulse 107  Temp(Src) 99.3 F (37.4 C) (Oral)  Resp 18  Wt 89 lb 6.4 oz (40.552 kg)  SpO2 91% Physical Exam Nursing notes and Vital Signs reviewed. Appearance:  Patient appears stated age, and in no acute distress Eyes:  Pupils are equal, round, and reactive to light and accomodation.  Extraocular movement is intact.  Conjunctivae are not inflamed  Ears:  Canals normal.  Tympanic membranes normal.  Nose:  Mildly congested turbinates.  No sinus tenderness.  Pharynx:  Normal Neck:  Supple.  Tender enlarged posterior nodes are palpated bilaterally  Lungs:  Faint bilateral expiratory wheezes present.  Breath sounds are equal.  Heart:  Regular rate and rhythm without murmurs, rubs, or gallops.  Abdomen:  Nontender without masses or hepatosplenomegaly.  Bowel sounds are present.  No CVA or flank tenderness.   Extremities:  No edema.  No calf tenderness Skin:  No rash present.   ED Course  Procedures  None   Labs Reviewed  POCT CBC W AUTO DIFF ([facility]):  WBC 8.9; LY 23.3; MO 7.7; GR 69.0; Hgb 12.0; Platelets 330          MDM   1. Viral URI with cough   2. Bronchospasm, acute    Begin doxycycline '100mg'$  BID, and prednisone burst. Take plain guaifenesin ('1200mg'$  extended release tabs such as Mucinex) twice daily, with  plenty of water, for cough and congestion.  Get adequate rest.   Recommend using saline nasal spray several times daily and saline nasal irrigation (AYR is a common brand).  Try warm salt water gargles for sore throat.  Stop all antihistamines for now, and other non-prescription cough/cold preparations. Use inhalers as prescribed May take Delsym Cough Suppressant at bedtime for nighttime cough.  Follow-up with family doctor if not improving about 2 to 3 days. If symptoms become significantly worse during the night or over the weekend, proceed to the local emergency room.     Kandra Nicolas, MD 12/20/14 [phone]

## 2014-12-15 NOTE — Discharge Instructions (Signed)
Take plain guaifenesin ('1200mg'$  extended release tabs such as Mucinex) twice daily, with plenty of water, for cough and congestion.  Get adequate rest.   Recommend using saline nasal spray several times daily and saline nasal irrigation (AYR is a common brand).  Try warm salt water gargles for sore throat.  Stop all antihistamines for now, and other non-prescription cough/cold preparations. Use inhalers as prescribed May take Delsym Cough Suppressant at bedtime for nighttime cough.  Follow-up with family doctor if not improving about 2 to 3 days. If symptoms become significantly worse during the night or over the weekend, proceed to the local emergency room.

## 2014-12-22 ENCOUNTER — Telehealth: Payer: Self-pay | Admitting: *Deleted

## 2014-12-23 ENCOUNTER — Encounter (HOSPITAL_COMMUNITY)
Admission: RE | Admit: 2014-12-23 | Discharge: 2014-12-23 | Disposition: A | Discharge status: Home / Self Care | Payer: Medicare Other | Source: Ambulatory Visit | Attending: Surgery | Admitting: Surgery

## 2014-12-23 ENCOUNTER — Encounter (HOSPITAL_COMMUNITY): Payer: Self-pay

## 2014-12-23 HISTORY — DX: Other specified postprocedural states: Z98.890

## 2014-12-23 HISTORY — DX: Headache, unspecified: R51.9

## 2014-12-23 HISTORY — DX: Headache: R51

## 2014-12-23 HISTORY — DX: Palpitations: R00.2

## 2014-12-23 HISTORY — DX: Gastro-esophageal reflux disease without esophagitis: K21.9

## 2014-12-23 HISTORY — DX: Liver disease, unspecified: K76.9

## 2014-12-23 HISTORY — DX: Presence of spectacles and contact lenses: Z97.3

## 2014-12-23 HISTORY — DX: Reserved for inherently not codable concepts without codable children: IMO0001

## 2014-12-23 HISTORY — DX: Other specified postprocedural states: R11.2

## 2014-12-23 LAB — COMPREHENSIVE METABOLIC PANEL
ALK PHOS: 72 U/L (ref 38–126)
ALT: 18 U/L (ref 14–54)
ANION GAP: 8 (ref 5–15)
AST: 20 U/L (ref 15–41)
Albumin: 3.1 g/dL — ABNORMAL LOW (ref 3.5–5.0)
BUN: 9 mg/dL (ref 6–20)
CO2: 30 mmol/L (ref 22–32)
Calcium: 8.8 mg/dL — ABNORMAL LOW (ref 8.9–10.3)
Chloride: 98 mmol/L — ABNORMAL LOW (ref 101–111)
Creatinine, Ser: 0.69 mg/dL (ref 0.44–1.00)
GFR calc Af Amer: >60 mL/min (ref >60)
GFR calc non Af Amer: >60 mL/min (ref >60)
Glucose, Bld: 90 mg/dL (ref 65–99)
Potassium: 3.9 mmol/L (ref 3.5–5.1)
Sodium: 136 mmol/L (ref 135–145)
Total Bilirubin: 0.4 mg/dL (ref 0.3–1.2)
Total Protein: 5.9 g/dL — ABNORMAL LOW (ref 6.5–8.1)

## 2014-12-23 LAB — CBC
HCT: 37.5 % (ref 36.0–46.0)
Hemoglobin: 12.5 g/dL (ref 12.0–15.0)
MCH: 29.6 pg (ref 26.0–34.0)
MCHC: 33.3 g/dL (ref 30.0–36.0)
MCV: 88.9 fL (ref 78.0–100.0)
Platelets: 481 10*3/uL — ABNORMAL HIGH (ref 150–400)
RBC: 4.22 MIL/uL (ref 3.87–5.11)
RDW: 13.1 % (ref 11.5–15.5)
WBC: 7.9 10*3/uL (ref 4.0–10.5)

## 2014-12-23 MED ORDER — CEFAZOLIN SODIUM-DEXTROSE 2-3 GM-% IV SOLR
2.0000 g | INTRAVENOUS | Status: DC
  Filled 2014-12-23: qty 50

## 2014-12-23 NOTE — Progress Notes (Signed)
Pt currently denies SOB and chest pain. However, pt stated that she does have episodes of palpitations that she has been experiencing on and off for several years; she stated that she was prescribed Metoprolol for her symptoms. Pt PCP is Dr. Joellyn Quails and pt stated that she was seen by Dr. Stanford Breed, cardiology in the past, but has no cardiologist currently. Pt stated that she wears 3L of oxygen at all times ( although she was not wearing 02 at her PAT but had an oxygen saturation of 99-100% on RA ) . Tamara Dy, NP asked to review pt history. Pt made aware to stop taking Aspirin, otc vitamins and herbal medications. Do not take any NSAIDs ie: Ibuprofen, Advil, Naproxen or any medication containing Aspirin. Pt verbalized understanding of all pre-opnstructions.

## 2014-12-23 NOTE — Pre-Procedure Instructions (Signed)
Tamara Adams  12/23/2014      [facility] - [place], [place] [address] [place] [address] Phone: [phone] Fax: [phone]    Your procedure is scheduled on  Friday, December 24, 2014  Report to [facility] Admitting at 10:30 A.M.  Call this number if you have problems the morning of surgery:  [phone]   Remember:  Do not eat food or drink liquids after midnight.  Take these medicines the morning of surgery with A SIP OF WATER : metoprolol succinate (TOPROL-XL),omeprazole (PRILOSEC),  SPIRIVA HANDIHALER 18 MCG inhalation,  Fluticasone Furoate-Vilanterol,  if needed:ondansetron (ZOFRAN-ODT) for nausea, albuterol (PROVENTIL) (2.5 MG/3ML) 0.083% nebulizer solution or inhaler ( Bring inhaler in with you on day of procedure)   Do not wear jewelry, make-up or nail polish.  Do not wear lotions, powders, or perfumes.  You may not wear deodorant.  Do not shave 48 hours prior to surgery.    Do not bring valuables to the hospital.  [facility] is not responsible for any belongings or valuables.  Contacts, dentures or bridgework may not be worn into surgery.  Leave your suitcase in the car.  After surgery it may be brought to your room.  For patients admitted to the hospital, discharge time will be determined by your treatment team.  Patients discharged the day of surgery will not be allowed to drive home.   Name and phone number of your driver:    Special instructions:   Special Instructions:Special Instructions: [facility] - Preparing for Surgery  Before surgery, you can play an important role.  Because skin is not sterile, your skin needs to be as free of germs as possible.  You can reduce the number of germs on you skin by washing with CHG (chlorahexidine gluconate) soap before surgery.  CHG is an antiseptic cleaner which kills germs and bonds with the skin to continue killing germs even after washing.  Please DO NOT use  if you have an allergy to CHG or antibacterial soaps.  If your skin becomes reddened/irritated stop using the CHG and inform your nurse when you arrive at Short Stay.  Do not shave (including legs and underarms) for at least 48 hours prior to the first CHG shower.  You may shave your face.  Please follow these instructions carefully:   1.  Shower with CHG Soap the night before surgery and the morning of Surgery.  2.  If you choose to wash your hair, wash your hair first as usual with your normal shampoo.  3.  After you shampoo, rinse your hair and body thoroughly to remove the Shampoo.  4.  Use CHG as you would any other liquid soap.  You can apply chg directly  to the skin and wash gently with scrungie or a clean washcloth.  5.  Apply the CHG Soap to your body ONLY FROM THE NECK DOWN.  Do not use on open wounds or open sores.  Avoid contact with your eyes, ears, mouth and genitals (private parts).  Wash genitals (private parts) with your normal soap.  6.  Wash thoroughly, paying special attention to the area where your surgery will be performed.  7.  Thoroughly rinse your body with warm water from the neck down.  8.  DO NOT shower/wash with your normal soap after using and rinsing off the CHG Soap.  9.  Pat yourself dry with a clean towel.  10.  Wear clean pajamas.            11.  Place clean sheets on your bed the night of your first shower and do not sleep with pets.  Day of Surgery  Do not apply any lotions/deodorants the morning of surgery.  Please wear clean clothes to the hospital/surgery center.  Please read over the following fact sheets that you were given. Pain Booklet, Coughing and Deep Breathing and Surgical Site Infection Prevention

## 2014-12-23 NOTE — H&P (Signed)
Tamara Adams 12/13/2014 4:01 PM Location: [facility] Patient #: [id number] DOB: Nov 21, 1940 Married / Language: English / Race: White Female  History of Present Illness (Tamara Adams A. Ninfa Linden MD; 12/13/2014 4:22 PM) Patient words: reck.  The patient is a 74 year old female who presents with abdominal pain. This patient is well known to our practice. She has had multiple small bowel obstructions in the past. I did a laparoscopic cholecystectomy on her last year. Prior to that, her last laparotomy for bowel obstruction was done in [place]. She still has chronic intermittent abdominal pain with nausea and vomiting. Most recently, she had an upper GI small bowel follow-through showing findings consistent with a partial obstruction. She has intermittent constipation which can be severe at times depending on what she eats. She is followed closely by Dr. Fuller Plan from a gastroenterologic standpoint. She is on oxygen at night secondary to her COPD   Other Problems [name], CMA; 12/13/2014 4:01 PM) Back Pain Bladder Problems Chronic Obstructive Lung Disease Gastric Ulcer High blood pressure Home Oxygen Use Hypercholesterolemia Kidney Stone Oophorectomy DVT  Past Surgical History [name], CMA; 12/13/2014 4:01 PM) Cataract Surgery Bilateral. Colon Polyp Removal - Colonoscopy Gallbladder Surgery - Laparoscopic Hysterectomy (not due to cancer) - Partial Resection of Small Bowel  Diagnostic Studies History [name], CMA; 12/13/2014 4:01 PM) Colonoscopy 1-5 years ago Mammogram 1-3 years ago Pap Smear 1-5 years ago  Allergies ([name], CMA; 12/13/2014 4:05 PM) Maxidex *OPHTHALMIC AGENTS* Advair HFA *ANTIASTHMATIC AND BRONCHODILATOR AGENTS* TraZODone HCl *ANTIDEPRESSANTS* Tylox *ANALGESICS - OPIOID*  Medication History ([name], CMA; 12/13/2014 4:04 PM) Albuterol Sulfate ((5 MG/ML)0.5% Nebulized Soln, Inhalation) Active. Amlodipine  Besy-Benazepril HCl (5-'40MG'$  Capsule, Oral) Active. Linzess (290MCG Capsule, Oral) Active. Ativan ('1MG'$  Tablet, Oral) Active. Metoprolol Succinate ER ('25MG'$  Tablet ER 24HR, Oral) Active. Multivitamins (Oral) Active. Omeprazole ('40MG'$  Capsule DR, Oral) Active. Pravastatin Sodium ('40MG'$  Tablet, Oral) Active. Spiriva Respimat (1.25MCG/ACT Aerosol Soln, Inhalation) Active. Medications Reconciled  Social History [name], CMA; 12/13/2014 4:01 PM) Alcohol use Remotely quit alcohol use. Caffeine use Carbonated beverages, Coffee, Tea. No drug use Tobacco use Current some day smoker.  Family History [name], [place]; 12/13/2014 4:01 PM) Diabetes Mellitus Father. Hypertension Mother. Ovarian Cancer Mother.  Pregnancy / Birth History [name], [place]; 12/13/2014 4:01 PM) Age at menarche 50 years. Age of menopause 60-50 Contraceptive History Oral contraceptives. Gravida 5 Maternal age 35-20 Para 3  Review of Systems ([name]; 12/13/2014 4:01 PM) General Present- Fatigue, Fever and Weight Loss. Not Present- Appetite Loss, Chills, Night Sweats and Weight Gain. HEENT Present- Wears glasses/contact lenses. Not Present- Earache, Hearing Loss, Hoarseness, Nose Bleed, Oral Ulcers, Ringing in the Ears, Seasonal Allergies, Sinus Pain, Sore Throat, Visual Disturbances and Yellow Eyes. Respiratory Present- Chronic Cough, Difficulty Breathing and Wheezing. Not Present- Bloody sputum and Snoring. Cardiovascular Present- Palpitations, Rapid Heart Rate and Shortness of Breath. Not Present- Chest Pain, Difficulty Breathing Lying Down, Leg Cramps and Swelling of Extremities. Gastrointestinal Present- Abdominal Pain, Bloating, Change in Bowel Habits, Hemorrhoids, Nausea and Vomiting. Not Present- Bloody Stool, Chronic diarrhea, Constipation, Difficulty Swallowing, Excessive gas, Gets full quickly at meals, Indigestion and Rectal Pain. Female Genitourinary Not Present- Frequency,  Nocturia, Painful Urination, Pelvic Pain and Urgency. Musculoskeletal Present- Back Pain and Muscle Weakness. Not Present- Joint Pain, Joint Stiffness, Muscle Pain and Swelling of Extremities. Neurological Present- Weakness. Not Present- Decreased Memory, Fainting, Headaches, Numbness, Seizures, Tingling, Tremor and Trouble walking. Psychiatric Not Present- Anxiety, Bipolar, Change in Sleep Pattern, Depression, Fearful and  Frequent crying. Endocrine Not Present- Cold Intolerance, Excessive Hunger, Hair Changes, Heat Intolerance, Hot flashes and New Diabetes. Hematology Present- Easy Bruising. Not Present- Excessive bleeding, Gland problems, HIV and Persistent Infections.   Vitals ([name] CMA; 12/13/2014 4:02 PM) 12/13/2014 4:01 PM Weight: 89 lb Height: 64in Body Surface Area: 1.35 m Body Mass Index: 15.28 kg/m Temp.: 98.28F(Temporal)  Pulse: 77 (Regular)  BP: 134/70 (Sitting, Left Arm, Standard)    Physical Exam ([name] A. Ninfa Linden MD; 12/13/2014 4:23 PM) The physical exam findings are as follows: Genera well in appearance Lungs clear CV RRR Skin with no rashes or jaundice Note:Today, her abdomen is soft and nontender. There is an incisional hernia at the umbilicus which is easily reducible and small.    Assessment & Plan ([name] A. Ninfa Linden MD; 12/13/2014 4:23 PM)  SBO (SMALL BOWEL OBSTRUCTION) (560.9  K56.69)  Impression: Because of the frequency of her obstructive symptoms, she wishes to proceed with surgery. I believe this is very reasonable. I suspect this is from adhesions. I discussed this with her in detail. I discussed the risks which includes but is not limited to bleeding, infection, need for bowel resection, injury to the bowel, potential use of mesh to fix the hernia, cardiopulmonary issues with prolonged ventilation given her COPD, other cardiac issues, DVT, etc. She understands and wishes to proceed with surgery

## 2014-12-23 NOTE — Progress Notes (Signed)
Anesthesia Chart Review:  Pt is 74 year old female scheduled for exploratory laparotomy, LOA, possible small bowel resection, possible incisional hernia repair with mesh on 12/24/2014 with Dr. Ninfa Linden.   PCP is Dr. Beatrice Lecher, last office visit 10/14/2014  PMH includes: HTN, severe COPD (FVC 72%, FEV1 29%, FEV1 ratio 32%; pt uses 3L O2 at all times), hyperlipidemia, liver lesion (being followed by PCP, repeat CT to be scheduled for 03/2015), DVT ("years ago after surgery"), GERD, peptic ulcer.  Recurrent small bowel obstructions. Current smoker. BMI 15. S/p laparoscopic cholecystectomy 08/18/13.   Medications include: albuterol, amlodipine, metoprolol, pravachol, spiriva.   Preoperative labs are pending. To be drawn at today's PAT visit.   Chest x-ray 03/31/2014 reviewed. No active cardiopulmonary disease.   EKG 09/06/2014: NSR. Biatrial enlargement. ST abnormality, possible digitalis effect. No acute changes per Dr. Gardiner Ramus interpretation.   Pt had chest flutter symptoms in 2012, saw Dr. Stanford Breed for this. Had cardiac event monitor 3/28-4/17/2012 that showed sinus rhythm, no dysrhythmias. Dr. Jacalyn Lefevre notes indicate pt reportedly had flutter symptoms while wearing monitor. Eventually sx resolved on their own.  Sx recurred 2-3 months ago. Pt reports sx feel exactly the same as they did in 2012.  Pt saw Dr. Madilyn Fireman for this, was started on metoprolol which pt feels has helped.  Pt reports flutter feeling typically happens about once a day, lasts for a few seconds.  She feels sx in the center of her chest. No other associated sx.   Given pt's severe COPD and recent flutter sx, I contacted Dr. Madilyn Fireman to see if she approved of pt proceeding with surgery tomorrow. Dr. Madilyn Fireman tells me pt has very stable problems and may proceed as scheduled.   If pt's labs acceptable, I anticipate she can proceed as scheduled.   Willeen Cass, FNP-BC [facility] Short Stay Surgical Center/Anesthesiology Phone:  [phone] 12/23/2014 2:08 PM

## 2014-12-24 ENCOUNTER — Inpatient Hospital Stay (HOSPITAL_COMMUNITY): Payer: Medicare Other | Admitting: Emergency Medicine

## 2014-12-24 ENCOUNTER — Encounter (HOSPITAL_COMMUNITY)
Admission: RE | Disposition: A | Discharge status: Home / Self Care | Payer: Self-pay | Source: Ambulatory Visit | Attending: Surgery

## 2014-12-24 ENCOUNTER — Inpatient Hospital Stay (HOSPITAL_COMMUNITY)
Admission: RE | Admit: 2014-12-24 | Discharge: 2014-12-29 | DRG: 335 | Disposition: A | Discharge status: Home / Self Care | Payer: Medicare Other | Source: Ambulatory Visit | Attending: Surgery | Admitting: Surgery

## 2014-12-24 ENCOUNTER — Encounter (HOSPITAL_COMMUNITY): Payer: Self-pay | Admitting: Certified Registered Nurse Anesthetist

## 2014-12-24 ENCOUNTER — Inpatient Hospital Stay (HOSPITAL_COMMUNITY): Payer: Medicare Other | Admitting: Certified Registered Nurse Anesthetist

## 2014-12-24 DIAGNOSIS — K5660 Unspecified intestinal obstruction: Secondary | ICD-10-CM | POA: Diagnosis not present

## 2014-12-24 DIAGNOSIS — K432 Incisional hernia without obstruction or gangrene: Secondary | ICD-10-CM | POA: Diagnosis present

## 2014-12-24 DIAGNOSIS — Z681 Body mass index (BMI) 19 or less, adult: Secondary | ICD-10-CM | POA: Diagnosis not present

## 2014-12-24 DIAGNOSIS — K56609 Unspecified intestinal obstruction, unspecified as to partial versus complete obstruction: Secondary | ICD-10-CM | POA: Diagnosis present

## 2014-12-24 DIAGNOSIS — Z86718 Personal history of other venous thrombosis and embolism: Secondary | ICD-10-CM

## 2014-12-24 DIAGNOSIS — J449 Chronic obstructive pulmonary disease, unspecified: Secondary | ICD-10-CM | POA: Diagnosis present

## 2014-12-24 DIAGNOSIS — R109 Unspecified abdominal pain: Secondary | ICD-10-CM | POA: Diagnosis not present

## 2014-12-24 DIAGNOSIS — K565 Intestinal adhesions [bands] with obstruction (postprocedural) (postinfection): Principal | ICD-10-CM | POA: Diagnosis present

## 2014-12-24 DIAGNOSIS — F17210 Nicotine dependence, cigarettes, uncomplicated: Secondary | ICD-10-CM | POA: Diagnosis present

## 2014-12-24 DIAGNOSIS — Z79899 Other long term (current) drug therapy: Secondary | ICD-10-CM | POA: Diagnosis not present

## 2014-12-24 DIAGNOSIS — E43 Unspecified severe protein-calorie malnutrition: Secondary | ICD-10-CM | POA: Diagnosis present

## 2014-12-24 DIAGNOSIS — K219 Gastro-esophageal reflux disease without esophagitis: Secondary | ICD-10-CM | POA: Diagnosis not present

## 2014-12-24 HISTORY — DX: Other complications of anesthesia, initial encounter: T88.59XA

## 2014-12-24 HISTORY — PX: EXPLORATORY LAPAROTOMY: SUR591

## 2014-12-24 HISTORY — DX: Unspecified intestinal obstruction, unspecified as to partial versus complete obstruction: K56.609

## 2014-12-24 HISTORY — PX: ABDOMINAL ADHESION SURGERY: SHX90

## 2014-12-24 HISTORY — DX: Adverse effect of unspecified anesthetic, initial encounter: T41.45XA

## 2014-12-24 HISTORY — DX: Unspecified osteoarthritis, unspecified site: M19.90

## 2014-12-24 HISTORY — PX: LYSIS OF ADHESION: SHX5961

## 2014-12-24 HISTORY — PX: INCISIONAL HERNIA REPAIR: SHX193

## 2014-12-24 HISTORY — DX: Dependence on supplemental oxygen: Z99.81

## 2014-12-24 HISTORY — DX: Conduction disorder, unspecified: I45.9

## 2014-12-24 HISTORY — PX: LAPAROTOMY: SHX154

## 2014-12-24 SURGERY — LAPAROTOMY, EXPLORATORY
Anesthesia: General | Site: Abdomen

## 2014-12-24 MED ORDER — METOPROLOL SUCCINATE ER 25 MG PO TB24
25.0000 mg | ORAL_TABLET | Freq: Every day | ORAL | Status: DC
  Administered 2014-12-25 – 2014-12-29 (×5): 25 mg via ORAL
  Filled 2014-12-24 (×6): qty 1

## 2014-12-24 MED ORDER — PROMETHAZINE HCL 25 MG/ML IJ SOLN: 6.2500 mg | INTRAMUSCULAR | Status: DC | PRN

## 2014-12-24 MED ORDER — ROCURONIUM BROMIDE 100 MG/10ML IV SOLN
INTRAVENOUS | Status: DC | PRN
  Administered 2014-12-24: 30 mg via INTRAVENOUS
  Administered 2014-12-24: 10 mg via INTRAVENOUS

## 2014-12-24 MED ORDER — PROPOFOL 10 MG/ML IV BOLUS
INTRAVENOUS | Status: AC
  Filled 2014-12-24: qty 20

## 2014-12-24 MED ORDER — MIDAZOLAM HCL 5 MG/5ML IJ SOLN
INTRAMUSCULAR | Status: DC | PRN
  Administered 2014-12-24: 2 mg via INTRAVENOUS

## 2014-12-24 MED ORDER — PHENYLEPHRINE HCL 10 MG/ML IJ SOLN
INTRAMUSCULAR | Status: DC | PRN
  Administered 2014-12-24 (×6): 80 ug via INTRAVENOUS

## 2014-12-24 MED ORDER — PHENYLEPHRINE 40 MCG/ML (10ML) SYRINGE FOR IV PUSH (FOR BLOOD PRESSURE SUPPORT)
PREFILLED_SYRINGE | INTRAVENOUS | Status: AC
  Filled 2014-12-24: qty 10

## 2014-12-24 MED ORDER — SODIUM CHLORIDE 0.9 % IJ SOLN: 9.0000 mL | INTRAMUSCULAR | Status: DC | PRN

## 2014-12-24 MED ORDER — ONDANSETRON HCL 4 MG/2ML IJ SOLN
INTRAMUSCULAR | Status: AC
  Filled 2014-12-24: qty 2

## 2014-12-24 MED ORDER — GLYCOPYRROLATE 0.2 MG/ML IJ SOLN
INTRAMUSCULAR | Status: AC
  Filled 2014-12-24: qty 3

## 2014-12-24 MED ORDER — HYDROMORPHONE HCL 1 MG/ML IJ SOLN: 1.0000 mg | INTRAMUSCULAR | Status: DC | PRN

## 2014-12-24 MED ORDER — NEOSTIGMINE METHYLSULFATE 10 MG/10ML IV SOLN
INTRAVENOUS | Status: AC
  Filled 2014-12-24: qty 1

## 2014-12-24 MED ORDER — POTASSIUM CHLORIDE IN NACL 20-0.9 MEQ/L-% IV SOLN
INTRAVENOUS | Status: DC
  Administered 2014-12-24 – 2014-12-29 (×4): via INTRAVENOUS
  Filled 2014-12-24 (×10): qty 1000

## 2014-12-24 MED ORDER — LACTATED RINGERS IV SOLN
INTRAVENOUS | Status: DC
  Administered 2014-12-24 (×3): via INTRAVENOUS

## 2014-12-24 MED ORDER — ENOXAPARIN SODIUM 40 MG/0.4ML ~~LOC~~ SOLN
40.0000 mg | SUBCUTANEOUS | Status: DC
  Administered 2014-12-25: 40 mg via SUBCUTANEOUS
  Filled 2014-12-24: qty 0.4

## 2014-12-24 MED ORDER — FLUTICASONE FUROATE-VILANTEROL 100-25 MCG/INH IN AEPB
1.0000 | INHALATION_SPRAY | Freq: Every day | RESPIRATORY_TRACT | Status: DC
Start: 2014-12-24 — End: 2014-12-29
  Administered 2014-12-25 – 2014-12-26 (×2): 1 via RESPIRATORY_TRACT
  Administered 2014-12-27: 11:00:00 via RESPIRATORY_TRACT

## 2014-12-24 MED ORDER — AMLODIPINE BESYLATE 5 MG PO TABS
5.0000 mg | ORAL_TABLET | Freq: Every day | ORAL | Status: DC
  Administered 2014-12-25 – 2014-12-29 (×5): 5 mg via ORAL
  Filled 2014-12-24 (×5): qty 1

## 2014-12-24 MED ORDER — ROCURONIUM BROMIDE 50 MG/5ML IV SOLN
INTRAVENOUS | Status: AC
  Filled 2014-12-24: qty 1

## 2014-12-24 MED ORDER — FENTANYL CITRATE (PF) 100 MCG/2ML IJ SOLN
INTRAMUSCULAR | Status: DC | PRN
  Administered 2014-12-24: 50 ug via INTRAVENOUS
  Administered 2014-12-24: 100 ug via INTRAVENOUS
  Administered 2014-12-24 (×2): 50 ug via INTRAVENOUS

## 2014-12-24 MED ORDER — MORPHINE SULFATE (PF) 1 MG/ML IV SOLN
INTRAVENOUS | Status: DC
  Administered 2014-12-24: 5 mg via INTRAVENOUS
  Administered 2014-12-24 – 2014-12-25 (×2): via INTRAVENOUS
  Administered 2014-12-25: 7 mg via INTRAVENOUS
  Administered 2014-12-25: 6 mg via INTRAVENOUS
  Administered 2014-12-25: 3 mg via INTRAVENOUS
  Administered 2014-12-25: 9 mg via INTRAVENOUS
  Administered 2014-12-25: 3 mg via INTRAVENOUS
  Administered 2014-12-26: 10:00:00 via INTRAVENOUS
  Administered 2014-12-26: 3 mg via INTRAVENOUS
  Administered 2014-12-26: 2 mg via INTRAVENOUS
  Administered 2014-12-26 (×2): 4 mg via INTRAVENOUS
  Administered 2014-12-26: 5 mg via INTRAVENOUS
  Administered 2014-12-27: 2 mg via INTRAVENOUS
  Administered 2014-12-27: 1 mg via INTRAVENOUS
  Filled 2014-12-24 (×2): qty 25

## 2014-12-24 MED ORDER — MORPHINE SULFATE (PF) 1 MG/ML IV SOLN
INTRAVENOUS | Status: AC
  Filled 2014-12-24: qty 25

## 2014-12-24 MED ORDER — GLYCOPYRROLATE 0.2 MG/ML IJ SOLN
INTRAMUSCULAR | Status: DC | PRN
  Administered 2014-12-24: 0.6 mg via INTRAVENOUS

## 2014-12-24 MED ORDER — ONDANSETRON HCL 4 MG/2ML IJ SOLN
4.0000 mg | Freq: Four times a day (QID) | INTRAMUSCULAR | Status: DC | PRN
  Administered 2014-12-24 – 2014-12-27 (×5): 4 mg via INTRAVENOUS
  Filled 2014-12-24 (×6): qty 2

## 2014-12-24 MED ORDER — ONDANSETRON HCL 4 MG/2ML IJ SOLN
INTRAMUSCULAR | Status: DC | PRN
  Administered 2014-12-24: 4 mg via INTRAVENOUS

## 2014-12-24 MED ORDER — DIPHENHYDRAMINE HCL 50 MG/ML IJ SOLN: 12.5000 mg | Freq: Four times a day (QID) | INTRAMUSCULAR | Status: DC | PRN

## 2014-12-24 MED ORDER — DEXAMETHASONE SODIUM PHOSPHATE 4 MG/ML IJ SOLN
INTRAMUSCULAR | Status: AC
  Filled 2014-12-24: qty 1

## 2014-12-24 MED ORDER — FENTANYL CITRATE (PF) 100 MCG/2ML IJ SOLN
INTRAMUSCULAR | Status: AC
  Filled 2014-12-24: qty 2

## 2014-12-24 MED ORDER — PANTOPRAZOLE SODIUM 40 MG IV SOLR
40.0000 mg | INTRAVENOUS | Status: DC
  Administered 2014-12-24 – 2014-12-25 (×2): 40 mg via INTRAVENOUS
  Filled 2014-12-24 (×2): qty 40

## 2014-12-24 MED ORDER — PROPOFOL 10 MG/ML IV BOLUS
INTRAVENOUS | Status: DC | PRN
  Administered 2014-12-24: 160 mg via INTRAVENOUS

## 2014-12-24 MED ORDER — LIDOCAINE HCL (CARDIAC) 20 MG/ML IV SOLN
INTRAVENOUS | Status: DC | PRN
  Administered 2014-12-24: 60 mg via INTRAVENOUS

## 2014-12-24 MED ORDER — AMLODIPINE BESY-BENAZEPRIL HCL 5-40 MG PO CAPS: 1.0000 | ORAL_CAPSULE | Freq: Every day | ORAL | Status: DC

## 2014-12-24 MED ORDER — DIPHENHYDRAMINE HCL 12.5 MG/5ML PO ELIX: 12.5000 mg | ORAL_SOLUTION | Freq: Four times a day (QID) | ORAL | Status: DC | PRN

## 2014-12-24 MED ORDER — ALBUTEROL SULFATE (2.5 MG/3ML) 0.083% IN NEBU
2.5000 mg | INHALATION_SOLUTION | Freq: Four times a day (QID) | RESPIRATORY_TRACT | Status: DC | PRN
  Administered 2014-12-27 (×2): 2.5 mg via RESPIRATORY_TRACT
  Filled 2014-12-24 (×2): qty 3

## 2014-12-24 MED ORDER — SUCCINYLCHOLINE CHLORIDE 20 MG/ML IJ SOLN
INTRAMUSCULAR | Status: AC
  Filled 2014-12-24: qty 1

## 2014-12-24 MED ORDER — NALOXONE HCL 0.4 MG/ML IJ SOLN: 0.4000 mg | INTRAMUSCULAR | Status: DC | PRN

## 2014-12-24 MED ORDER — CEFAZOLIN SODIUM-DEXTROSE 2-3 GM-% IV SOLR
INTRAVENOUS | Status: DC | PRN
  Administered 2014-12-24: 2 g via INTRAVENOUS

## 2014-12-24 MED ORDER — FENTANYL CITRATE (PF) 100 MCG/2ML IJ SOLN
25.0000 ug | INTRAMUSCULAR | Status: DC | PRN
  Administered 2014-12-24 (×4): 25 ug via INTRAVENOUS

## 2014-12-24 MED ORDER — NEOSTIGMINE METHYLSULFATE 10 MG/10ML IV SOLN
INTRAVENOUS | Status: DC | PRN
  Administered 2014-12-24: 4 mg via INTRAVENOUS

## 2014-12-24 MED ORDER — LIDOCAINE HCL (CARDIAC) 20 MG/ML IV SOLN
INTRAVENOUS | Status: AC
  Filled 2014-12-24: qty 5

## 2014-12-24 MED ORDER — ONDANSETRON HCL 4 MG PO TABS: 4.0000 mg | ORAL_TABLET | Freq: Four times a day (QID) | ORAL | Status: DC | PRN

## 2014-12-24 MED ORDER — ALBUTEROL SULFATE HFA 108 (90 BASE) MCG/ACT IN AERS
INHALATION_SPRAY | RESPIRATORY_TRACT | Status: AC
  Filled 2014-12-24: qty 6.7

## 2014-12-24 MED ORDER — ALBUTEROL SULFATE HFA 108 (90 BASE) MCG/ACT IN AERS: 2.0000 | INHALATION_SPRAY | Freq: Four times a day (QID) | RESPIRATORY_TRACT | Status: DC | PRN

## 2014-12-24 MED ORDER — BENAZEPRIL HCL 40 MG PO TABS
40.0000 mg | ORAL_TABLET | Freq: Every day | ORAL | Status: DC
  Administered 2014-12-25 – 2014-12-29 (×5): 40 mg via ORAL
  Filled 2014-12-24 (×8): qty 1

## 2014-12-24 MED ORDER — TIOTROPIUM BROMIDE MONOHYDRATE 18 MCG IN CAPS
18.0000 ug | ORAL_CAPSULE | Freq: Every day | RESPIRATORY_TRACT | Status: DC
  Administered 2014-12-24 – 2014-12-29 (×4): 18 ug via RESPIRATORY_TRACT
  Filled 2014-12-24 (×4): qty 5

## 2014-12-24 MED ORDER — ONDANSETRON HCL 4 MG/2ML IJ SOLN
4.0000 mg | Freq: Four times a day (QID) | INTRAMUSCULAR | Status: DC | PRN
  Administered 2014-12-24: 4 mg via INTRAVENOUS

## 2014-12-24 MED ORDER — 0.9 % SODIUM CHLORIDE (POUR BTL) OPTIME
TOPICAL | Status: DC | PRN
  Administered 2014-12-24: 1000 mL

## 2014-12-24 MED ORDER — FENTANYL CITRATE (PF) 250 MCG/5ML IJ SOLN
INTRAMUSCULAR | Status: AC
  Filled 2014-12-24: qty 5

## 2014-12-24 MED ORDER — LINACLOTIDE 290 MCG PO CAPS
290.0000 ug | ORAL_CAPSULE | Freq: Every day | ORAL | Status: DC
  Administered 2014-12-25 – 2014-12-29 (×5): 290 ug via ORAL
  Filled 2014-12-24 (×7): qty 1

## 2014-12-24 MED ORDER — MIDAZOLAM HCL 2 MG/2ML IJ SOLN
INTRAMUSCULAR | Status: AC
  Filled 2014-12-24: qty 2

## 2014-12-24 SURGICAL SUPPLY — 65 items
BLADE SURG ROTATE 9660 (MISCELLANEOUS) IMPLANT
CANISTER SUCTION 2500CC (MISCELLANEOUS) ×4 IMPLANT
CHLORAPREP W/TINT 26ML (MISCELLANEOUS) ×4 IMPLANT
COVER MAYO STAND STRL (DRAPES) IMPLANT
COVER SURGICAL LIGHT HANDLE (MISCELLANEOUS) ×4 IMPLANT
DRAPE LAPAROSCOPIC ABDOMINAL (DRAPES) ×4 IMPLANT
DRAPE PROXIMA HALF (DRAPES) IMPLANT
DRAPE UTILITY XL STRL (DRAPES) ×8 IMPLANT
DRAPE WARM FLUID 44X44 (DRAPE) ×4 IMPLANT
DRSG MEPILEX BORDER 4X12 (GAUZE/BANDAGES/DRESSINGS) IMPLANT
DRSG MEPILEX BORDER 4X8 (GAUZE/BANDAGES/DRESSINGS) IMPLANT
DRSG OPSITE POSTOP 4X10 (GAUZE/BANDAGES/DRESSINGS) IMPLANT
DRSG OPSITE POSTOP 4X8 (GAUZE/BANDAGES/DRESSINGS) IMPLANT
DRSG TEGADERM 4X4.75 (GAUZE/BANDAGES/DRESSINGS) ×4 IMPLANT
ELECT BLADE 6.5 EXT (BLADE) IMPLANT
ELECT CAUTERY BLADE 6.4 (BLADE) ×8 IMPLANT
ELECT REM PT RETURN 9FT ADLT (ELECTROSURGICAL) ×4
ELECTRODE REM PT RTRN 9FT ADLT (ELECTROSURGICAL) ×2 IMPLANT
GAUZE SPONGE 4X4 12PLY STRL (GAUZE/BANDAGES/DRESSINGS) ×8 IMPLANT
GLOVE BIO SURGEON STRL SZ 6.5 (GLOVE) ×3 IMPLANT
GLOVE BIO SURGEON STRL SZ7 (GLOVE) ×4 IMPLANT
GLOVE BIO SURGEON STRL SZ7.5 (GLOVE) ×12 IMPLANT
GLOVE BIO SURGEONS STRL SZ 6.5 (GLOVE) ×1
GLOVE BIOGEL PI IND STRL 7.0 (GLOVE) ×4 IMPLANT
GLOVE BIOGEL PI IND STRL 7.5 (GLOVE) ×2 IMPLANT
GLOVE BIOGEL PI INDICATOR 7.0 (GLOVE) ×4
GLOVE BIOGEL PI INDICATOR 7.5 (GLOVE) ×2
GLOVE SURG SIGNA 7.5 PF LTX (GLOVE) ×8 IMPLANT
GOWN STRL REUS W/ TWL LRG LVL3 (GOWN DISPOSABLE) ×4 IMPLANT
GOWN STRL REUS W/ TWL XL LVL3 (GOWN DISPOSABLE) ×2 IMPLANT
GOWN STRL REUS W/TWL LRG LVL3 (GOWN DISPOSABLE) ×4
GOWN STRL REUS W/TWL XL LVL3 (GOWN DISPOSABLE) ×2
KIT BASIN OR (CUSTOM PROCEDURE TRAY) ×4 IMPLANT
KIT ROOM TURNOVER OR (KITS) ×4 IMPLANT
LIGASURE IMPACT 36 18CM CVD LR (INSTRUMENTS) ×4 IMPLANT
NEEDLE HYPO 25GX1X1/2 BEV (NEEDLE) ×4 IMPLANT
NS IRRIG 1000ML POUR BTL (IV SOLUTION) ×8 IMPLANT
PACK GENERAL/GYN (CUSTOM PROCEDURE TRAY) ×4 IMPLANT
PAD ARMBOARD 7.5X6 YLW CONV (MISCELLANEOUS) ×4 IMPLANT
PENCIL BUTTON HOLSTER BLD 10FT (ELECTRODE) IMPLANT
SPECIMEN JAR LARGE (MISCELLANEOUS) ×4 IMPLANT
SPONGE LAP 18X18 X RAY DECT (DISPOSABLE) IMPLANT
STAPLER VISISTAT 35W (STAPLE) ×4 IMPLANT
SUCTION POOLE TIP (SUCTIONS) ×4 IMPLANT
SUT MNCRL AB 4-0 PS2 18 (SUTURE) ×4 IMPLANT
SUT NOVA NAB DX-16 0-1 5-0 T12 (SUTURE) IMPLANT
SUT PDS AB 1 TP1 96 (SUTURE) ×8 IMPLANT
SUT PROLENE 1 CT (SUTURE) IMPLANT
SUT SILK 2 0 SH CR/8 (SUTURE) ×4 IMPLANT
SUT SILK 2 0 TIES 10X30 (SUTURE) ×4 IMPLANT
SUT SILK 3 0 SH CR/8 (SUTURE) ×4 IMPLANT
SUT SILK 3 0 TIES 10X30 (SUTURE) ×4 IMPLANT
SUT VIC AB 3-0 SH 18 (SUTURE) IMPLANT
SUT VIC AB 3-0 SH 27 (SUTURE) ×2
SUT VIC AB 3-0 SH 27XBRD (SUTURE) ×2 IMPLANT
SYR CONTROL 10ML LL (SYRINGE) ×4 IMPLANT
TOWEL OR 17X24 6PK STRL BLUE (TOWEL DISPOSABLE) ×8 IMPLANT
TOWEL OR 17X26 10 PK STRL BLUE (TOWEL DISPOSABLE) ×4 IMPLANT
TRAY FOLEY CATH 14FRSI W/METER (CATHETERS) IMPLANT
TRAY FOLEY CATH 16FRSI W/METER (SET/KITS/TRAYS/PACK) IMPLANT
TRAY FOLEY W/METER SILVER 14FR (SET/KITS/TRAYS/PACK) ×4 IMPLANT
TUBE CONNECTING 12'X1/4 (SUCTIONS)
TUBE CONNECTING 12X1/4 (SUCTIONS) IMPLANT
WATER STERILE IRR 1000ML POUR (IV SOLUTION) IMPLANT
YANKAUER SUCT BULB TIP NO VENT (SUCTIONS) ×4 IMPLANT

## 2014-12-24 NOTE — Anesthesia Preprocedure Evaluation (Signed)
Anesthesia Evaluation  Patient identified by MRN, date of birth, ID band Patient awake    Reviewed: Allergy & Precautions, NPO status   History of Anesthesia Complications (+) PONV  Airway Mallampati: II  TM Distance: >3 FB Neck ROM: Full    Dental   Pulmonary shortness of breath, pneumonia -, COPDCurrent Smoker,  breath sounds clear to auscultation        Cardiovascular hypertension, + Peripheral Vascular Disease Rhythm:Regular Rate:Normal     Neuro/Psych    GI/Hepatic Neg liver ROS, hiatal hernia, PUD, GERD-  ,  Endo/Other    Renal/GU Renal disease     Musculoskeletal   Abdominal   Peds  Hematology   Anesthesia Other Findings   Reproductive/Obstetrics                             Anesthesia Physical Anesthesia Plan  ASA: III  Anesthesia Plan: General   Post-op Pain Management:    Induction: Intravenous  Airway Management Planned: Oral ETT  Additional Equipment:   Intra-op Plan:   Post-operative Plan: Extubation in OR  Informed Consent: I have reviewed the patients History and Physical, chart, labs and discussed the procedure including the risks, benefits and alternatives for the proposed anesthesia with the patient or authorized representative who has indicated his/her understanding and acceptance.   Dental advisory given  Plan Discussed with: CRNA, Anesthesiologist and Surgeon  Anesthesia Plan Comments:         Anesthesia Quick Evaluation

## 2014-12-24 NOTE — Transfer of Care (Signed)
Immediate Anesthesia Transfer of Care Note  Patient: Tamara Adams  Procedure(s) Performed: Procedure(s): EXPLORATORY LAPAROTOMY (N/A) LYSIS OF ADHESION (N/A) HERNIA REPAIR INCISIONAL (N/A)  Patient Location: PACU  Anesthesia Type:General  Level of Consciousness: awake, alert , oriented, patient cooperative and responds to stimulation  Airway & Oxygen Therapy: Patient Spontanous Breathing and Patient connected to face mask oxygen  Post-op Assessment: Report given to RN, Post -op Vital signs reviewed and stable and Patient moving all extremities X 4  Post vital signs: Reviewed and stable  Last Vitals:  Filed Vitals:   12/24/14 1049  BP: 155/71  Pulse:   Temp:   Resp:     Complications: No apparent anesthesia complications

## 2014-12-24 NOTE — Progress Notes (Signed)
Waiting on 6N room

## 2014-12-24 NOTE — Progress Notes (Deleted)
As rolling out of pacu, pt started shivering and stated he felt funny like maybe he was having a panic attack. Returned to [facility] and notified Dr.Germeroth who immediately came to bedside. He noted pt was anxious, in pain and BP up. Orders received to treat pain and cont to monitor.

## 2014-12-24 NOTE — Anesthesia Postprocedure Evaluation (Signed)
  Anesthesia Post-op Note  Patient: Tamara Adams  Procedure(s) Performed: Procedure(s): EXPLORATORY LAPAROTOMY (N/A) LYSIS OF ADHESION (N/A) HERNIA REPAIR INCISIONAL (N/A)  Patient Location: PACU  Anesthesia Type:General  Level of Consciousness: awake  Airway and Oxygen Therapy: Patient Spontanous Breathing  Post-op Pain: mild  Post-op Assessment: Post-op Vital signs reviewed  Post-op Vital Signs: Reviewed  Last Vitals:  Filed Vitals:   12/24/14 1359  BP: 174/56  Pulse: 86  Temp:   Resp: 19    Complications: No apparent anesthesia complications

## 2014-12-24 NOTE — Op Note (Signed)
EXPLORATORY LAPAROTOMY, LYSIS OF ADHESION, HERNIA REPAIR INCISIONAL  Procedure Note  Tamara Adams Febo 12/24/2014   Pre-op Diagnosis: Small Bowel Onbstruction and incisional hernia     Post-op Diagnosis: same  Procedure(s): EXPLORATORY LAPAROTOMY LYSIS OF ADHESION HERNIA REPAIR INCISIONAL  Surgeon(s): Coralie Keens, MD Donnie Mesa, MD  Anesthesia: General  Staff:  Circulator: Gilberto Better, RN Scrub Person: Jon Gills Kretschmaier Circulator Assistant: Tereasa Coop, RN; Quincy Carnes, RN  Estimated Blood Loss: Minimal                         [name]   Date: 12/24/2014  Time: 1:17 PM

## 2014-12-24 NOTE — Progress Notes (Signed)
Pt admitted to 6N29 via bed from PACU.  Pt AAO X4.  Pt on 2L O2.  Pt has NGT to lt nare in place.  Pt has 18G to left wrist infusing.  MLA incision with dsg C/D/I.  Foley in place draining clear yellow urine.  SCDs in place.  Pt has no questions at the moment.  Will continue to monitor.

## 2014-12-24 NOTE — Anesthesia Procedure Notes (Signed)
Procedure Name: Intubation Date/Time: 12/24/2014 12:25 PM Performed by: Gershon Mussel, Trinette Vera Pre-anesthesia Checklist: Patient identified, Patient being monitored, Timeout performed, Emergency Drugs available and Suction available Patient Re-evaluated:Patient Re-evaluated prior to inductionOxygen Delivery Method: Circle System Utilized Preoxygenation: Pre-oxygenation with 100% oxygen Intubation Type: IV induction Ventilation: Mask ventilation without difficulty Laryngoscope Size: Miller and 2 Grade View: Grade I Tube type: Oral Tube size: 7.0 mm Number of attempts: 1 Airway Equipment and Method: Stylet Placement Confirmation: ETT inserted through vocal cords under direct vision,  positive ETCO2 and breath sounds checked- equal and bilateral Secured at: 21 cm Tube secured with: Tape Dental Injury: Teeth and Oropharynx as per pre-operative assessment

## 2014-12-24 NOTE — Interval H&P Note (Signed)
History and Physical Interval Note: no change in H and P  12/24/2014 11:34 AM  Tamara Adams  has presented today for surgery, with the diagnosis of Small Bowel Onbstruction  The various methods of treatment have been discussed with the patient and family. After consideration of risks, benefits and other options for treatment, the patient has consented to  Procedure(s): EXPLORATORY LAPAROTOMY, LYSIS OF ADHESIONS, POSSIBLE BOWEL RESECTION, POSSIBLE INCISIONAL HERNIA REPAIR WITH MESH (N/A) LYSIS OF ADHESION (N/A) SMALL BOWEL RESECTION (N/A) HERNIA REPAIR INCISIONAL (N/A) INSERTION OF MESH (N/A) as a surgical intervention .  The patient's history has been reviewed, patient examined, no change in status, stable for surgery.  I have reviewed the patient's chart and labs.  Questions were answered to the patient's satisfaction.     Linah Klapper A

## 2014-12-25 LAB — BASIC METABOLIC PANEL
Anion gap: 10 (ref 5–15)
BUN: 7 mg/dL (ref 6–20)
CALCIUM: 8.9 mg/dL (ref 8.9–10.3)
CHLORIDE: 93 mmol/L — AB (ref 101–111)
CO2: 30 mmol/L (ref 22–32)
CREATININE: 0.6 mg/dL (ref 0.44–1.00)
GFR calc Af Amer: >60 mL/min (ref >60)
GFR calc non Af Amer: >60 mL/min (ref >60)
Glucose, Bld: 125 mg/dL — ABNORMAL HIGH (ref 65–99)
Potassium: 4.3 mmol/L (ref 3.5–5.1)
SODIUM: 133 mmol/L — AB (ref 135–145)

## 2014-12-25 LAB — CBC
HCT: 39.8 % (ref 36.0–46.0)
Hemoglobin: 13.3 g/dL (ref 12.0–15.0)
MCH: 29.7 pg (ref 26.0–34.0)
MCHC: 33.4 g/dL (ref 30.0–36.0)
MCV: 88.8 fL (ref 78.0–100.0)
PLATELETS: 513 10*3/uL — AB (ref 150–400)
RBC: 4.48 MIL/uL (ref 3.87–5.11)
RDW: 13.3 % (ref 11.5–15.5)
WBC: 25.7 10*3/uL — ABNORMAL HIGH (ref 4.0–10.5)

## 2014-12-25 MED ORDER — CETYLPYRIDINIUM CHLORIDE 0.05 % MT LIQD
7.0000 mL | Freq: Two times a day (BID) | OROMUCOSAL | Status: DC
  Administered 2014-12-25 – 2014-12-28 (×8): 7 mL via OROMUCOSAL

## 2014-12-25 MED ORDER — CALCIUM CARBONATE ANTACID 500 MG PO CHEW
400.0000 mg | CHEWABLE_TABLET | Freq: Two times a day (BID) | ORAL | Status: DC
  Administered 2014-12-25 – 2014-12-29 (×7): 400 mg via ORAL
  Filled 2014-12-25 (×7): qty 2

## 2014-12-25 MED ORDER — BOOST / RESOURCE BREEZE PO LIQD
1.0000 | Freq: Three times a day (TID) | ORAL | Status: DC
  Administered 2014-12-25 – 2014-12-28 (×5): 1 via ORAL

## 2014-12-25 MED ORDER — ENOXAPARIN SODIUM 30 MG/0.3ML ~~LOC~~ SOLN
30.0000 mg | SUBCUTANEOUS | Status: DC
  Administered 2014-12-26 – 2014-12-29 (×4): 30 mg via SUBCUTANEOUS
  Filled 2014-12-25 (×4): qty 0.3

## 2014-12-25 NOTE — Progress Notes (Signed)
1 Day Post-Op  Subjective: comfortable  Objective: Vital signs in last 24 hours: Temp:  [97.4 F (36.3 C)-99.2 F (37.3 C)] 98.8 F (37.1 C) (06/04 0508) Pulse Rate:  [63-99] 84 (06/04 0508) Resp:  [13-22] 17 (06/04 0508) BP: (123-174)/(48-74) 155/60 mmHg (06/04 0550) SpO2:  [95 %-99 %] 97 % (06/04 0508) Weight:  [40.189 kg (88 lb 9.6 oz)-40.501 kg (89 lb 4.6 oz)] 40.189 kg (88 lb 9.6 oz) (06/03 1808)    Intake/Output from previous day: 06/03 0701 - 06/04 0700 In: 3180 [I.V.:3150; NG/GT:30] Out: 2305 [Urine:1725; Emesis/NG output:550; Blood:30] Intake/Output this shift:    Lungs clear Abdomen soft, dressing dry  Lab Results:   Recent Labs  12/23/14 1413 12/25/14 0406  WBC 7.9 25.7*  HGB 12.5 13.3  HCT 37.5 39.8  PLT 481* 513*   BMET  Recent Labs  12/23/14 1413 12/25/14 0406  NA 136 133*  K 3.9 4.3  CL 98* 93*  CO2 30 30  GLUCOSE 90 125*  BUN 9 7  CREATININE 0.69 0.60  CALCIUM 8.8* 8.9   PT/INR No results for input(s): LABPROT, INR in the last 72 hours. ABG No results for input(s): PHART, HCO3 in the last 72 hours.  Invalid input(s): PCO2, PO2  Studies/Results: No results found.  Anti-infectives: Anti-infectives    Start     Dose/Rate Route Frequency Ordered Stop   12/24/14 1200  ceFAZolin (ANCEF) IVPB 2 g/50 mL premix  Status:  Discontinued     2 g 100 mL/hr over 30 Minutes Intravenous To Surgery 12/23/14 0918 12/24/14 1807      Assessment/Plan: s/p Procedure(s): EXPLORATORY LAPAROTOMY (N/A) LYSIS OF ADHESION (N/A) HERNIA REPAIR INCISIONAL (N/A)  D/c foley D/c NG Start clear liquid diet  LOS: 1 day    Tamara Adams A 12/25/2014

## 2014-12-25 NOTE — Op Note (Signed)
Tamara Adams, ROYALS                 ACCOUNT NO.:  [id number]  MEDICAL RECORD NO.:  [id number]  LOCATION:  6N29C                        FACILITY:  [facility]  PHYSICIAN:  Coralie Keens, M.D. DATE OF BIRTH:  [birth year]  DATE OF PROCEDURE:  12/24/2014 DATE OF DISCHARGE:                              OPERATIVE REPORT   PREOPERATIVE DIAGNOSES:  Small bowel obstruction and incisional hernia.  POSTOPERATIVE DIAGNOSES:  Small bowel obstruction and incisional hernia.  PROCEDURES: 1. Exploratory laparotomy. 2. Lysis of adhesions. 3. Incisional hernia repair.  SURGEON:  Coralie Keens, M.D.  ASSISTANT:  Imogene Burn. Tsuei, M.D.  ANESTHESIA:  General endotracheal anesthesia.  ESTIMATED BLOOD LOSS:  Minimal.  INDICATIONS:  This is a 74 year old female who has had intermittent partial bowel obstructions.  She has had multiple exploratory laparotomies.  She had a recent small bowel follow-through suggesting a continued partial small bowel obstruction.  She also has a known incisional hernia, which does not contain any bowel.  Decision was made to proceed with exploratory laparotomy.  FINDINGS:  The patient was found to have a single loop of small intestines stuck to the abdominal wall near the pelvis.  This was the site of partial obstruction from the bowel intermittently twisting at this area.  There was no other intraabdominal pathology identified.  The patient did have a moderate amount of stool in the colon as well as a significantly distended stomach without obstruction.  I elected to repair the small incisional hernia primarily.  PROCEDURE IN DETAIL:  The patient was brought to the operating room, identified as Tamara Adams.  She was placed supine on the operating table and general anesthesia was induced.  Her abdomen was then prepped and draped in usual sterile fashion.  I created a midline incision with a scalpel through the previous scar going just above the umbilicus.  I took  this down to the hernia sac, which was easily opened and allowed entrance into the peritoneal cavity.  I then opened the fascia, peritoneum, the rest of the length of the incision.  The patient's small bowel was eviscerated.  She did have one area of small bowel adhesed to omentum and I separated this area with the Metzenbaum scissors and found no evidence of obstruction there.  I then got the rest the bowel out of the pelvis and found one loop of small bowel stuck to the patient's midline.  This almost had the appearance of being stuck to the urachus. This was the obvious area of partial obstruction as I believed the bowel was intermittently twisting on this adhesive band.  I was able to separate the bowel from the adhesive band with Metzenbaum scissors. This was a wide adhesion that had been causing the obstruction.  I then ran the small bowel from the ligament of Treitz to terminal ileum. There was no stricturing of the bowel.  There were no other adhesions identified.  The patient did have a very large stomach filled with air. We elected to insert a nasogastric tube to deflate the stomach.  I saw no evidence of gastric outlet obstruction of any source.  The patient had a moderate amount of stool throughout the colon.  No other intraabdominal pathology was identified.  At this point, I cleared the fascia circumferentially with the cautery.  I then decided to fix the small fascial defect primarily without mesh.  I thus closed the hernia and the patient's midline fascia with running #1 looped PDS sutures as well as interrupted #1 Novafil sutures.  Good closure of the fascia appeared to be achieved.  Hemostasis was appeared to be achieved.  I then irrigated the wound with saline and closed the skin with skin staples.  The patient tolerated the procedure well.  All the counts were correct at the end of procedure.  The patient was then extubated in the operating room and taken in a stable  condition to the recovery room.     Coralie Keens, M.D.     DB/MEDQ  D:  12/24/2014  T:  12/25/2014  Job:  [id number]

## 2014-12-25 NOTE — Progress Notes (Signed)
Initial Nutrition Assessment  DOCUMENTATION CODES:  Severe malnutrition in context of chronic illness, Underweight  INTERVENTION:   Boost Breeze PO TID, each supplement provides 250 kcal and 9 grams of protein   NUTRITION DIAGNOSIS:  Malnutrition related to chronic illness as evidenced by severe depletion of muscle mass, severe depletion of body fat.  GOAL:  Patient will meet greater than or equal to 90% of their needs  MONITOR:  PO intake, Supplement acceptance, Diet advancement, Labs, Weight trends  REASON FOR ASSESSMENT:  Malnutrition Screening Tool    ASSESSMENT:  Patient admitted on 6/3 with abdominal pain. Hx of multiple SBOs in the past. S/P ex lap with lysis of adhesions and hernia repair on 6/3.  Patient reports that she has been eating poorly at home due to abd pain, nausea, and vomiting. She weighed 134 lbs 5-6 years ago, "when this all started." Nutrition-Focused physical exam completed. Findings are severe fat depletion and severe muscle depletion. She drinks chocolate Boost Plus at home.   NGT to be removed today and diet has been advanced to clear liquids. She agreed to try Boost Breeze between meals while on clear liquid diet.   Labs reviewed: Sodium is low.  Height:  Ht Readings from Last 1 Encounters:  12/24/14 '5\' 4"'$  (1.626 m)    Weight:  Wt Readings from Last 1 Encounters:  12/24/14 88 lb 9.6 oz (40.189 kg)    Ideal Body Weight:  54.5 kg  Wt Readings from Last 10 Encounters:  12/24/14 88 lb 9.6 oz (40.189 kg)  12/23/14 89 lb 4.6 oz (40.5 kg)  12/15/14 89 lb 6.4 oz (40.552 kg)  11/26/14 90 lb 8 oz (41.051 kg)  10/14/14 88 lb (39.917 kg)  10/08/14 90 lb 3.2 oz (40.914 kg)  09/14/14 98 lb (44.453 kg)  09/06/14 88 lb (39.917 kg)  07/05/14 93 lb (42.185 kg)  05/03/14 89 lb (40.37 kg)    BMI:  Body mass index is 15.2 kg/(m^2). Underweight  Estimated Nutritional Needs:  Kcal:  1200-1400  Protein:  60-70 gm  Fluid:  1.5 L  Skin:   Reviewed, no issues  Diet Order:  Diet clear liquid Room service appropriate?: Yes; Fluid consistency:: Thin  EDUCATION NEEDS:  Education needs no appropriate at this time   Intake/Output Summary (Last 24 hours) at 12/25/14 1327 Last data filed at 12/25/14 1323  Gross per 24 hour  Intake 3381.67 ml  Output   2275 ml  Net 1106.67 ml    Last BM:  PTA   Molli Barrows, RD, LDN, [place] Pager [phone] After Hours Pager [phone]

## 2014-12-26 MED ORDER — PANTOPRAZOLE SODIUM 40 MG PO TBEC
40.0000 mg | DELAYED_RELEASE_TABLET | Freq: Every day | ORAL | Status: DC
  Administered 2014-12-27 – 2014-12-28 (×3): 40 mg via ORAL
  Filled 2014-12-26 (×3): qty 1

## 2014-12-26 NOTE — Progress Notes (Signed)
2 Days Post-Op  Subjective: Tolerating clears without nausea No flatus yet  Objective: Vital signs in last 24 hours: Temp:  [98.4 F (36.9 C)-99.4 F (37.4 C)] 99.4 F (37.4 C) (06/05 0205) Pulse Rate:  [82-109] 84 (06/05 0205) Resp:  [15-25] 25 (06/05 0400) BP: (113-135)/(44-91) 113/45 mmHg (06/05 0205) SpO2:  [93 %-97 %] 96 % (06/05 0400) Last BM Date: 12/23/14  Intake/Output from previous day: 06/04 0701 - 06/05 0700 In: 1401.7 [P.O.:600; I.V.:771.7; NG/GT:30] Out: 100 [Emesis/NG output:100] Intake/Output this shift:    Lungs clear Abdomen soft, incision clean  Lab Results:   Recent Labs  12/23/14 1413 12/25/14 0406  WBC 7.9 25.7*  HGB 12.5 13.3  HCT 37.5 39.8  PLT 481* 513*   BMET  Recent Labs  12/23/14 1413 12/25/14 0406  NA 136 133*  K 3.9 4.3  CL 98* 93*  CO2 30 30  GLUCOSE 90 125*  BUN 9 7  CREATININE 0.69 0.60  CALCIUM 8.8* 8.9   PT/INR No results for input(s): LABPROT, INR in the last 72 hours. ABG No results for input(s): PHART, HCO3 in the last 72 hours.  Invalid input(s): PCO2, PO2  Studies/Results: No results found.  Anti-infectives: Anti-infectives    Start     Dose/Rate Route Frequency Ordered Stop   12/24/14 1200  ceFAZolin (ANCEF) IVPB 2 g/50 mL premix  Status:  Discontinued     2 g 100 mL/hr over 30 Minutes Intravenous To Surgery 12/23/14 0918 12/24/14 1807      Assessment/Plan: s/p Procedure(s): EXPLORATORY LAPAROTOMY (N/A) LYSIS OF ADHESION (N/A) HERNIA REPAIR INCISIONAL (N/A)  Keep on liquids Decrease IVF  ambulate  LOS: 2 days    Reymond Maynez A 12/26/2014

## 2014-12-27 ENCOUNTER — Encounter (HOSPITAL_COMMUNITY): Payer: Self-pay | Admitting: Surgery

## 2014-12-27 MED ORDER — HYDROCODONE-ACETAMINOPHEN 5-325 MG PO TABS
1.0000 | ORAL_TABLET | ORAL | Status: DC | PRN
  Administered 2014-12-27: 2 via ORAL
  Administered 2014-12-27: 1 via ORAL
  Administered 2014-12-28 – 2014-12-29 (×3): 2 via ORAL
  Filled 2014-12-27 (×4): qty 2
  Filled 2014-12-27: qty 1

## 2014-12-27 MED ORDER — POLYETHYLENE GLYCOL 3350 17 G PO PACK
17.0000 g | PACK | Freq: Every day | ORAL | Status: DC
  Administered 2014-12-27 – 2014-12-29 (×3): 17 g via ORAL
  Filled 2014-12-27 (×3): qty 1

## 2014-12-27 MED ORDER — MORPHINE SULFATE 2 MG/ML IJ SOLN
1.0000 mg | INTRAMUSCULAR | Status: DC | PRN
  Administered 2014-12-27: 2 mg via INTRAVENOUS
  Filled 2014-12-27: qty 1

## 2014-12-27 NOTE — Care Management Note (Signed)
Case Management Note  Patient Details  Name: Tamara Adams MRN: [id number] Date of Birth: 06/06/1941  Subjective/Objective:                    Action/Plan: UR updated   Expected Discharge Date:            12-29-14       Expected Discharge Plan:  Home/Self Care  In-House Referral:     Discharge planning Services     Post Acute Care Choice:    Choice offered to:     DME Arranged:    DME Agency:     HH Arranged:    [place] Agency:     Status of Service:  In process, will continue to follow  Medicare Important Message Given:    Date Medicare IM Given:    Medicare IM give by:    Date Additional Medicare IM Given:    Additional Medicare Important Message give by:     If discussed at [place] of Stay Meetings, dates discussed:    Additional Comments:  Marilu Favre, RN 12/27/2014, 3:06 PM

## 2014-12-27 NOTE — Progress Notes (Signed)
3 Days Post-Op  Subjective: No complaints Denies nausea No flatus  Objective: Vital signs in last 24 hours: Temp:  [98.6 F (37 C)-99.1 F (37.3 C)] 98.9 F (37.2 C) (06/06 0618) Pulse Rate:  [77-83] 81 (06/06 0618) Resp:  [15-25] 15 (06/06 0618) BP: (126-135)/(54-63) 126/54 mmHg (06/06 0618) SpO2:  [93 %-100 %] 100 % (06/06 0618) FiO2 (%):  [34 %-41 %] 37 % (06/06 0449) Last BM Date: 12/23/14  Intake/Output from previous day: 06/05 0701 - 06/06 0700 In: 3076.3 [P.O.:760; I.V.:2316.3] Out: -  Intake/Output this shift:    Abdomen Adams little full, non tender  Lab Results:   Recent Labs  12/25/14 0406  WBC 25.7*  HGB 13.3  HCT 39.8  PLT 513*   BMET  Recent Labs  12/25/14 0406  NA 133*  K 4.3  CL 93*  CO2 30  GLUCOSE 125*  BUN 7  CREATININE 0.60  CALCIUM 8.9   PT/INR No results for input(s): LABPROT, INR in the last 72 hours. ABG No results for input(s): PHART, HCO3 in the last 72 hours.  Invalid input(s): PCO2, PO2  Studies/Results: No results found.  Anti-infectives: Anti-infectives    Start     Dose/Rate Route Frequency Ordered Stop   12/24/14 1200  ceFAZolin (ANCEF) IVPB 2 g/50 mL premix  Status:  Discontinued     2 g 100 mL/hr over 30 Minutes Intravenous To Surgery 12/23/14 0918 12/24/14 1807      Assessment/Plan: s/p Procedure(s): EXPLORATORY LAPAROTOMY (N/Adams) LYSIS OF ADHESION (N/Adams) HERNIA REPAIR INCISIONAL (N/Adams)  D/c pca Full liquids diet  LOS: 3 days    Tamara Adams 12/27/2014

## 2014-12-28 MED ORDER — BOOST PLUS PO LIQD
237.0000 mL | Freq: Three times a day (TID) | ORAL | Status: DC
  Administered 2014-12-28 – 2014-12-29 (×3): 237 mL via ORAL
  Filled 2014-12-28 (×7): qty 237

## 2014-12-28 MED ORDER — BISACODYL 10 MG RE SUPP
10.0000 mg | Freq: Once | RECTAL | Status: AC
  Administered 2014-12-28: 10 mg via RECTAL
  Filled 2014-12-28: qty 1

## 2014-12-28 NOTE — Progress Notes (Signed)
Nutrition Follow-up  DOCUMENTATION CODES:  Severe malnutrition in context of chronic illness, Underweight  INTERVENTION:  -D/c Resource Breeze po TID, each supplement provides 250 kcal and 9 grams of protein -Boost Plus TID, each supplement provides 360 kcals and 14 grams protein  NUTRITION DIAGNOSIS:  Malnutrition related to chronic illness as evidenced by severe depletion of muscle mass, severe depletion of body fat.  Ongoing  GOAL:  Patient will meet greater than or equal to 90% of their needs  Progressing  MONITOR:  PO intake, Supplement acceptance, Labs, Weight trends, I & O's, Skin  REASON FOR ASSESSMENT:  Malnutrition Screening Tool    ASSESSMENT: Patient admitted on 6/3 with abdominal pain. Hx of multiple SBOs in the past. S/P ex lap with lysis of adhesions and hernia repair on 6/3.  NGT removed on 12/25/14.  Pt was advanced to a soft diet this morning. She reports that she tolerated her meal well, however, ate very little (a few bites of eggs and potatoes). She reports she is consuming the Resource Breeze supplements, but would prefer Boost, due to familiarity of product and increased nutrient density. PTA she was consuming Boost Plus twice daily, but wants to receive TID due to minimal intake. Discussed importance of good meal and supplement intake to promote healing.   Pt expressed appreciation for visit.   Labs reviewed. Na: 133.   Height:  Ht Readings from Last 1 Encounters:  12/24/14 '5\' 4"'$  (1.626 m)    Weight:  Wt Readings from Last 1 Encounters:  12/24/14 88 lb 9.6 oz (40.189 kg)    Ideal Body Weight:  54.5 kg  Wt Readings from Last 10 Encounters:  12/24/14 88 lb 9.6 oz (40.189 kg)  12/23/14 89 lb 4.6 oz (40.5 kg)  12/15/14 89 lb 6.4 oz (40.552 kg)  11/26/14 90 lb 8 oz (41.051 kg)  10/14/14 88 lb (39.917 kg)  10/08/14 90 lb 3.2 oz (40.914 kg)  09/14/14 98 lb (44.453 kg)  09/06/14 88 lb (39.917 kg)  07/05/14 93 lb (42.185 kg)  05/03/14 89  lb (40.37 kg)    BMI:  Body mass index is 15.2 kg/(m^2).  Estimated Nutritional Needs:  Kcal:  1200-1400  Protein:  60-70 gm  Fluid:  1.5 L  Skin:  Reviewed, no issues (closed abdominal incision)  Diet Order:  DIET SOFT Room service appropriate?: Yes; Fluid consistency:: Thin  EDUCATION NEEDS:  Education needs addressed   Intake/Output Summary (Last 24 hours) at 12/28/14 1104 Last data filed at 12/28/14 1000  Gross per 24 hour  Intake   2020 ml  Output      0 ml  Net   2020 ml    Last BM:  12/23/14  Art Levan A. Jimmye Norman, RD, LDN, CDE Pager: [phone] After hours Pager: [phone]

## 2014-12-28 NOTE — Progress Notes (Signed)
4 Days Post-Op  Subjective: Tolerated full liquids +flatus  Objective: Vital signs in last 24 hours: Temp:  [98.9 F (37.2 C)-100 F (37.8 C)] 98.9 F (37.2 C) (06/07 0520) Pulse Rate:  [83-91] 83 (06/07 0520) Resp:  [15-20] 19 (06/07 0520) BP: (133-146)/(54-59) 133/55 mmHg (06/07 0520) SpO2:  [92 %-96 %] 96 % (06/07 0520) FiO2 (%):  [32 %] 32 % (06/06 0846) Last BM Date: 12/23/14  Intake/Output from previous day: 06/06 0701 - 06/07 0700 In: 1660 [P.O.:1160; I.V.:500] Out: -  Intake/Output this shift:    Abdomen soft, non distended  Lab Results:  No results for input(s): WBC, HGB, HCT, PLT in the last 72 hours. BMET No results for input(s): NA, K, CL, CO2, GLUCOSE, BUN, CREATININE, CALCIUM in the last 72 hours. PT/INR No results for input(s): LABPROT, INR in the last 72 hours. ABG No results for input(s): PHART, HCO3 in the last 72 hours.  Invalid input(s): PCO2, PO2  Studies/Results: No results found.  Anti-infectives: Anti-infectives    Start     Dose/Rate Route Frequency Ordered Stop   12/24/14 1200  ceFAZolin (ANCEF) IVPB 2 g/50 mL premix  Status:  Discontinued     2 g 100 mL/hr over 30 Minutes Intravenous To Surgery 12/23/14 0918 12/24/14 1807      Assessment/Plan: s/p Procedure(s): EXPLORATORY LAPAROTOMY (N/A) LYSIS OF ADHESION (N/A) HERNIA REPAIR INCISIONAL (N/A)  Will try soft diet Dulcolax suppos  LOS: 4 days    Lyrik Buresh A 12/28/2014

## 2014-12-29 MED ORDER — HYDROCODONE-ACETAMINOPHEN 5-325 MG PO TABS: 1.0000 | ORAL_TABLET | ORAL | Status: DC | PRN

## 2014-12-29 NOTE — Discharge Summary (Addendum)
Physician Discharge Summary  Patient ID: Tamara Adams MRN: [id number] DOB/AGE: 11/28/1940 74 y.o.  Admit date: 12/24/2014 Discharge date: 12/29/2014  Admission Diagnoses:  Discharge Diagnoses:  Active Problems:   SBO (small bowel obstruction) severe malnutrition  Discharged Condition: good  Hospital Course: uneventful post op recovery s/p exp lap for chronic SBO.  Bowel function returned quickly.  Consults: None  Significant Diagnostic Studies:   Treatments: surgery: exp lap, lysis of adhesion, repair incisional hernia (no mesh)  Discharge Exam: Blood pressure 148/57, pulse 95, temperature 100.9 F (38.3 C), temperature source Oral, resp. rate 17, height '5\' 4"'$  (1.626 m), weight 40.189 kg (88 lb 9.6 oz), SpO2 99 %. General appearance: alert, cooperative and no distress Resp: clear to auscultation bilaterally Cardio: regular rate and rhythm, S1, S2 normal, no murmur, click, rub or gallop Incision/Wound: abdomen soft, incision healing well  Disposition: 01-Home or Self Care     Medication List    TAKE these medications        albuterol 108 (90 BASE) MCG/ACT inhaler  Commonly known as:  VENTOLIN HFA  Inhale 2 puffs into the lungs every 6 (six) hours as needed. 2-4 puffs inhaled every 4-6 hours     albuterol (2.5 MG/3ML) 0.083% nebulizer solution  Commonly known as:  PROVENTIL  USE 3 ML (2.5 MG TOTAL) BY NEBULIZATION EVERY 6 HOURS AS NEEDED FOR WHEEZING OR SHORTNESS OF BREATH     amLODipine-benazepril 5-40 MG per capsule  Commonly known as:  LOTREL  TAKE 1 CAPSULE DAILY     doxycycline 100 MG capsule  Commonly known as:  VIBRAMYCIN  Take 1 capsule (100 mg total) by mouth 2 (two) times daily. Take with food.     ENSURE PLUS Liqd  Take 2 Cans by mouth daily. For weight gain     Fluticasone Furoate-Vilanterol 100-25 MCG/INH Aepb  Inhale 1 puff into the lungs daily.     HYDROcodone-acetaminophen 5-325 MG per tablet  Commonly known as:  NORCO/VICODIN  Take 1-2  tablets by mouth every 4 (four) hours as needed for moderate pain.     Linaclotide 290 MCG Caps capsule  Commonly known as:  LINZESS  Take 1 capsule (290 mcg total) by mouth daily.     LORazepam 1 MG tablet  Commonly known as:  ATIVAN  Take 1 tablet (1 mg total) by mouth daily as needed for anxiety or sleep.     metoprolol succinate 25 MG 24 hr tablet  Commonly known as:  TOPROL-XL  Take 1 tablet (25 mg total) by mouth daily.     multivitamin with minerals Tabs tablet  Take 1 tablet by mouth daily. Pt uses One-A-Day brand     omeprazole 40 MG capsule  Commonly known as:  PRILOSEC  Take 40 mg by mouth daily.     ondansetron 4 MG disintegrating tablet  Commonly known as:  ZOFRAN-ODT  Take 1 tablet (4 mg total) by mouth every 8 (eight) hours as needed for nausea.     OXYGEN  Inhale 2-3 Units into the lungs daily.     pravastatin 40 MG tablet  Commonly known as:  PRAVACHOL  Take 1 tablet (40 mg total) by mouth at bedtime.     SPIRIVA HANDIHALER 18 MCG inhalation capsule  Generic drug:  tiotropium  INHALE THE CONTENTS OF 1 CAPSULE WITH 2 INHALATIONS ONLY ONCE DAILY           Follow-up Information    Follow up with [facility], MD. Schedule an appointment  as soon as possible for a visit in 1 week.   Specialty:  General Surgery   Why:  For suture removal, NURSES ONLY VISIT   Contact information:   [address] [place] [address] [phone]       Signed: Harl Bowie 12/29/2014, 7:38 AM

## 2014-12-29 NOTE — Plan of Care (Signed)
Problem: Discharge Progression Outcomes Goal: Staples/sutures removed Outcome: Not Met (add Reason) Will be removed outpatient

## 2014-12-29 NOTE — Progress Notes (Signed)
5 Days Post-Op  Subjective: Had 2 BM's Tolerating po  Objective: Vital signs in last 24 hours: Temp:  [98.6 F (37 C)-100.9 F (38.3 C)] 100.9 F (38.3 C) (06/07 2244) Pulse Rate:  [93-95] 95 (06/07 2244) Resp:  [17-19] 17 (06/07 2244) BP: (148-167)/(57) 148/57 mmHg (06/07 2244) SpO2:  [99 %] 99 % (06/07 2244) Last BM Date: 12/28/14  Intake/Output from previous day: 06/07 0701 - 06/08 0700 In: 3242 [P.O.:1300; I.V.:1942] Out: -  Intake/Output this shift:    Lungs clear abd soft, incision clean  Lab Results:  No results for input(s): WBC, HGB, HCT, PLT in the last 72 hours. BMET No results for input(s): NA, K, CL, CO2, GLUCOSE, BUN, CREATININE, CALCIUM in the last 72 hours. PT/INR No results for input(s): LABPROT, INR in the last 72 hours. ABG No results for input(s): PHART, HCO3 in the last 72 hours.  Invalid input(s): PCO2, PO2  Studies/Results: No results found.  Anti-infectives: Anti-infectives    Start     Dose/Rate Route Frequency Ordered Stop   12/24/14 1200  ceFAZolin (ANCEF) IVPB 2 g/50 mL premix  Status:  Discontinued     2 g 100 mL/hr over 30 Minutes Intravenous To Surgery 12/23/14 0918 12/24/14 1807      Assessment/Plan: s/p Procedure(s): EXPLORATORY LAPAROTOMY (N/A) LYSIS OF ADHESION (N/A) HERNIA REPAIR INCISIONAL (N/A)  Discharge home  LOS: 5 days    Gillian Meeuwsen A 12/29/2014

## 2014-12-29 NOTE — Discharge Instructions (Signed)
[facility]      [facility] [phone]  OPEN ABDOMINAL SURGERY: POST OP INSTRUCTIONS  Always review your discharge instruction sheet given to you by the facility where your surgery was performed.  IF YOU HAVE DISABILITY OR FAMILY LEAVE FORMS, YOU MUST BRING THEM TO THE OFFICE FOR PROCESSING.  PLEASE DO NOT GIVE THEM TO YOUR DOCTOR.  1. A prescription for pain medication may be given to you upon discharge.  Take your pain medication as prescribed, if needed.  If narcotic pain medicine is not needed, then you may take acetaminophen (Tylenol) or ibuprofen (Advil) as needed. 2. Take your usually prescribed medications unless otherwise directed. 3. If you need a refill on your pain medication, please contact your pharmacy. They will contact our office to request authorization.  Prescriptions will not be filled after 5pm or on week-ends. 4. You should follow a light diet the first few days after arrival home, such as soup and crackers, pudding, etc.unless your doctor has advised otherwise. A high-fiber, low fat diet can be resumed as tolerated.   Be sure to include lots of fluids daily. Most patients will experience some swelling and bruising on the chest and neck area.  Ice packs will help.  Swelling and bruising can take several days to resolve 5. Most patients will experience some swelling and bruising in the area of the incision. Ice pack will help. Swelling and bruising can take several days to resolve..  6. It is common to experience some constipation if taking pain medication after surgery.  Increasing fluid intake and taking a stool softener will usually help or prevent this problem from occurring.  A mild laxative (Milk of Magnesia or Miralax) should be taken according to package directions if there are no bowel movements after 48 hours. 7.  You may have steri-strips (small skin tapes) in place directly over the incision.  These strips should be left on the skin for 7-10 days.  If your  surgeon used skin glue on the incision, you may shower in 24 hours.  The glue will flake off over the next 2-3 weeks.  Any sutures or staples will be removed at the office during your follow-up visit. You may find that a light gauze bandage over your incision may keep your staples from being rubbed or pulled. You may shower and replace the bandage daily. 8. ACTIVITIES:  You may resume regular (light) daily activities beginning the next day--such as daily self-care, walking, climbing stairs--gradually increasing activities as tolerated.  You may have sexual intercourse when it is comfortable.  Refrain from any heavy lifting or straining until approved by your doctor. a. You may drive when you no longer are taking prescription pain medication, you can comfortably wear a seatbelt, and you can safely maneuver your car and apply brakes b. Return to Work: ___________________________________ 9. You should see your doctor in the office for a follow-up appointment approximately two weeks after your surgery.  Make sure that you call for this appointment within a day or two after you arrive home to insure a convenient appointment time. OTHER INSTRUCTIONS:  _____________________________________________________________ _____________________________________________________________  WHEN TO CALL YOUR DOCTOR: 1. Fever over 101.0 2. Inability to urinate 3. Nausea and/or vomiting 4. Extreme swelling or bruising 5. Continued bleeding from incision. 6. Increased pain, redness, or drainage from the incision. 7. Difficulty swallowing or breathing 8. Muscle cramping or spasms. 9. Numbness or tingling in hands or feet or around lips.  The clinic staff is available to   answer your questions during regular business hours.  Please don't hesitate to call and ask to speak to one of the nurses if you have concerns.  For further questions, please visit [email or web address]   

## 2014-12-29 NOTE — Progress Notes (Signed)
[facility] discharge instructions were reviewed with patient. Patient was given prescription for hydrocodone to take to her pharmacy.patient stated that she uses oxygen at home but her family did not bring an oxygen tank for her, patient stated that she would be ok without until she got home. Patient stated that she did not have any questions. Volunteers called to assist patient to her transportation.

## 2015-01-03 ENCOUNTER — Ambulatory Visit (INDEPENDENT_AMBULATORY_CARE_PROVIDER_SITE_OTHER): Payer: Medicare Other

## 2015-01-03 ENCOUNTER — Ambulatory Visit (INDEPENDENT_AMBULATORY_CARE_PROVIDER_SITE_OTHER): Payer: Medicare Other | Admitting: Family Medicine

## 2015-01-03 ENCOUNTER — Encounter: Payer: Self-pay | Admitting: Family Medicine

## 2015-01-03 VITALS — BP 187/87 | HR 127 | Temp 97.7°F | Wt 85.0 lb

## 2015-01-03 DIAGNOSIS — J441 Chronic obstructive pulmonary disease with (acute) exacerbation: Secondary | ICD-10-CM | POA: Diagnosis not present

## 2015-01-03 DIAGNOSIS — R0602 Shortness of breath: Secondary | ICD-10-CM

## 2015-01-03 DIAGNOSIS — J449 Chronic obstructive pulmonary disease, unspecified: Secondary | ICD-10-CM | POA: Diagnosis not present

## 2015-01-03 DIAGNOSIS — R062 Wheezing: Secondary | ICD-10-CM

## 2015-01-03 MED ORDER — PREDNISONE 20 MG PO TABS: ORAL_TABLET | ORAL | Status: DC

## 2015-01-03 MED ORDER — ONDANSETRON 4 MG PO TBDP: 4.0000 mg | ORAL_TABLET | Freq: Three times a day (TID) | ORAL | Status: DC | PRN

## 2015-01-03 MED ORDER — METHYLPREDNISOLONE SODIUM SUCC 125 MG IJ SOLR
125.0000 mg | Freq: Once | INTRAMUSCULAR | Status: AC
  Administered 2015-01-03: 125 mg via INTRAMUSCULAR

## 2015-01-03 MED ORDER — AZITHROMYCIN 250 MG PO TABS: ORAL_TABLET | ORAL | Status: AC

## 2015-01-03 NOTE — Progress Notes (Signed)
CC: Tamara Adams is a 74 y.o. female is here for pneumonia?   Subjective: HPI:  Cough, chest tightness, wheezing and some shortness of breath that has been present for the last 3 days. She feels fatigued and collection of symptoms feels like a prior infection with pneumonia. She is using albuterol helps with shortness of breath and tightness for a few hours. She's also felt like she is running a low-grade fever however this slightly improves with Tylenol. Symptoms have been worsening since onset. She was recently hospitalized for exploratory laparoscopy. She denies any confusion, genitourinary contains, no gastrointestinal complaints.   Review Of Systems Outlined In HPI  Past Medical History  Diagnosis Date  . COPD (chronic obstructive pulmonary disease)     FVC 72%, FEV1 29%, FEV1 ratio 32% (very severe (COPD)  . Hypertension   . Pneumonia   . DVT (deep venous thrombosis)     "LLE; years ago after a surgery"  . Pinched nerve     in back  . Peptic ulcer   . Tubular adenoma of colon 09/2011  . H/O hiatal hernia   . Anemia     as a child  . Diverticulosis   . Hyperlipidemia   . Shortness of breath dyspnea     with exertion  . Fluttering sensation of heart     pt put on metoprolol as a result  . GERD (gastroesophageal reflux disease)     PMH  . Liver lesion   . Wears glasses   . Complication of anesthesia     " My blood gas dropped during surgery so I was left on a ventilator and in ICU for 3 days."  . PONV (postoperative nausea and vomiting)   . Skipped heart beats   . On home oxygen therapy     "3L; sleep w/it; use it when I rest" (12/24/2014)  . Small bowel obstruction     "several times; OR twice" (12/24/2014)  . Headache     "maybe weekly" (12/24/2014)  . Degenerative disc disease   . Arthritis     "hands" (12/24/2014)  . Kidney stone     Past Surgical History  Procedure Laterality Date  . Abdominal hysterectomy  1987    and one ovary  . Oophorectomy  1992  . Cataract  extraction w/phaco Left 06/12/2012    Dr. Boykin Adams  . Cataract extraction w/ intraocular lens  implant, bilateral Right 06/22/2012    Dr. Lester Adams.   Marland Kitchen Urethral dilation    . Small intestine surgery      for a blockage, no bowel was removed, just kinked from scar tissue  . Cholecystectomy N/A 08/18/2013    Procedure: LAPAROSCOPIC CHOLECYSTECTOMY;  Surgeon: Tamara Bowie, MD;  Location: [facility];  Service: General;  Laterality: N/A;  . Dilation and curettage of uterus    . Tubal ligation    . Multiple tooth extractions    . Hernia repair    . Exploratory laparotomy  12/24/2014  . Abdominal adhesion surgery  12/24/2014  . Incisional hernia repair  12/24/2014  . Laparotomy N/A 12/24/2014    Procedure: EXPLORATORY LAPAROTOMY;  Surgeon: Tamara Keens, MD;  Location: [facility];  Service: General;  Laterality: N/A;  . Lysis of adhesion N/A 12/24/2014    Procedure: LYSIS OF ADHESION;  Surgeon: Tamara Keens, MD;  Location: [place] [facility];  Service: General;  Laterality: N/A;  . Incisional hernia repair N/A 12/24/2014    Procedure: HERNIA REPAIR INCISIONAL;  Surgeon: Tamara Keens, MD;  Location: [facility];  Service: General;  Laterality: N/A;   Family History  Problem Relation Age of Onset  . Hypertension Mother   . Ovarian cancer Mother   . Glaucoma Mother   . Colon cancer Neg Hx   . Colon polyps Neg Hx   . Diabetes Neg Hx   . Kidney disease Neg Hx   . Gallbladder disease Neg Hx   . Esophageal cancer Neg Hx   . Glaucoma Sister     History   Social History  . Marital Status: Married    Spouse Name: N/A  . Number of Children: 3  . Years of Education: N/A   Occupational History  . retired.      Social History Main Topics  . Smoking status: Current Some Day Smoker -- 0.25 packs/day for 57 years    Types: Cigarettes  . Smokeless tobacco: Never Used  . Alcohol Use: No  . Drug Use: No  . Sexual Activity: No   Other Topics Concern  . Not on file   Social History Narrative   No regular  exercise.      Objective: BP 187/87 mmHg  Pulse 127  Temp(Src) 97.7 F (36.5 C) (Oral)  Wt 85 lb (38.556 kg)  SpO2 92%  General: Alert and Oriented, No Acute Distress HEENT: Pupils equal, round, reactive to light. Conjunctivae clear.  Moist mucous membranes pharynx unremarkable Lungs: Comfortable work of breathing with inspiratory and expiratory wheezing mild to moderate in severity in all lung fields. No rhonchi or rails Cardiac: Regular rate and rhythm. Normal S1/S2.  No murmurs, rubs, nor gallops.   Extremities: No peripheral edema.  Strong peripheral pulses.  Mental Status: No depression, anxiety, nor agitation. Skin: Warm and dry.  Assessment & Plan: Tamara Adams was seen today for pneumonia?.  Diagnoses and all orders for this visit:  Wheezing Orders: -     DG Chest 2 View; Future -     ondansetron (ZOFRAN-ODT) 4 MG disintegrating tablet; Take 1 tablet (4 mg total) by mouth every 8 (eight) hours as needed for nausea. -     predniSONE (DELTASONE) 20 MG tablet; Three tabs daily days 1-3, two tabs daily days 4-6, one tab daily days 7-9, half tab daily days 10-13. -     azithromycin (ZITHROMAX) 250 MG tablet; Take two tabs at once on day 1, then one tab daily on days 2-5.  SOB (shortness of breath) Orders: -     DG Chest 2 View; Future -     ondansetron (ZOFRAN-ODT) 4 MG disintegrating tablet; Take 1 tablet (4 mg total) by mouth every 8 (eight) hours as needed for nausea. -     predniSONE (DELTASONE) 20 MG tablet; Three tabs daily days 1-3, two tabs daily days 4-6, one tab daily days 7-9, half tab daily days 10-13. -     azithromycin (ZITHROMAX) 250 MG tablet; Take two tabs at once on day 1, then one tab daily on days 2-5.  COPD exacerbation Orders: -     ondansetron (ZOFRAN-ODT) 4 MG disintegrating tablet; Take 1 tablet (4 mg total) by mouth every 8 (eight) hours as needed for nausea. -     predniSONE (DELTASONE) 20 MG tablet; Three tabs daily days 1-3, two tabs daily days 4-6,  one tab daily days 7-9, half tab daily days 10-13. -     azithromycin (ZITHROMAX) 250 MG tablet; Take two tabs at once on day 1, then one tab daily on days 2-5.   X-rays reassuring  showing no signs of pneumonia but known changes of COPD. Begin prednisone taper and azithromycin. She is requesting a refill on Zofran for chronic nausea that has not been worsening since this illness.Signs and symptoms requring emergent/urgent reevaluation were discussed with the patient.  Return for 5-7 days for BP and Breathing Recheck.

## 2015-01-03 NOTE — Addendum Note (Signed)
Addended by: Terance Hart on: 01/03/2015 03:37 PM   Modules accepted: Orders

## 2015-01-11 ENCOUNTER — Emergency Department (HOSPITAL_COMMUNITY): Payer: Medicare Other

## 2015-01-11 ENCOUNTER — Inpatient Hospital Stay (HOSPITAL_COMMUNITY)
Admission: EM | Admit: 2015-01-11 | Discharge: 2015-01-14 | DRG: 194 | Disposition: A | Discharge status: Home / Self Care | Payer: Medicare Other | Attending: Internal Medicine | Admitting: Internal Medicine

## 2015-01-11 ENCOUNTER — Telehealth: Payer: Self-pay | Admitting: *Deleted

## 2015-01-11 ENCOUNTER — Encounter (HOSPITAL_COMMUNITY): Payer: Self-pay | Admitting: Emergency Medicine

## 2015-01-11 DIAGNOSIS — Y95 Nosocomial condition: Secondary | ICD-10-CM | POA: Diagnosis present

## 2015-01-11 DIAGNOSIS — Z90710 Acquired absence of both cervix and uterus: Secondary | ICD-10-CM | POA: Diagnosis not present

## 2015-01-11 DIAGNOSIS — J189 Pneumonia, unspecified organism: Secondary | ICD-10-CM | POA: Diagnosis not present

## 2015-01-11 DIAGNOSIS — Z961 Presence of intraocular lens: Secondary | ICD-10-CM | POA: Diagnosis present

## 2015-01-11 DIAGNOSIS — J441 Chronic obstructive pulmonary disease with (acute) exacerbation: Secondary | ICD-10-CM | POA: Diagnosis present

## 2015-01-11 DIAGNOSIS — D72829 Elevated white blood cell count, unspecified: Secondary | ICD-10-CM | POA: Diagnosis not present

## 2015-01-11 DIAGNOSIS — Z9842 Cataract extraction status, left eye: Secondary | ICD-10-CM | POA: Diagnosis not present

## 2015-01-11 DIAGNOSIS — Z9049 Acquired absence of other specified parts of digestive tract: Secondary | ICD-10-CM | POA: Diagnosis present

## 2015-01-11 DIAGNOSIS — J984 Other disorders of lung: Secondary | ICD-10-CM | POA: Diagnosis present

## 2015-01-11 DIAGNOSIS — Z9981 Dependence on supplemental oxygen: Secondary | ICD-10-CM | POA: Diagnosis not present

## 2015-01-11 DIAGNOSIS — Z9841 Cataract extraction status, right eye: Secondary | ICD-10-CM

## 2015-01-11 DIAGNOSIS — J9610 Chronic respiratory failure, unspecified whether with hypoxia or hypercapnia: Secondary | ICD-10-CM | POA: Diagnosis present

## 2015-01-11 DIAGNOSIS — R0602 Shortness of breath: Secondary | ICD-10-CM | POA: Diagnosis not present

## 2015-01-11 DIAGNOSIS — E785 Hyperlipidemia, unspecified: Secondary | ICD-10-CM | POA: Diagnosis present

## 2015-01-11 DIAGNOSIS — M1990 Unspecified osteoarthritis, unspecified site: Secondary | ICD-10-CM | POA: Diagnosis present

## 2015-01-11 DIAGNOSIS — I10 Essential (primary) hypertension: Secondary | ICD-10-CM | POA: Diagnosis present

## 2015-01-11 DIAGNOSIS — Z86718 Personal history of other venous thrombosis and embolism: Secondary | ICD-10-CM | POA: Diagnosis not present

## 2015-01-11 DIAGNOSIS — K219 Gastro-esophageal reflux disease without esophagitis: Secondary | ICD-10-CM | POA: Diagnosis present

## 2015-01-11 DIAGNOSIS — F17210 Nicotine dependence, cigarettes, uncomplicated: Secondary | ICD-10-CM | POA: Diagnosis present

## 2015-01-11 DIAGNOSIS — Z888 Allergy status to other drugs, medicaments and biological substances status: Secondary | ICD-10-CM

## 2015-01-11 DIAGNOSIS — Z8711 Personal history of peptic ulcer disease: Secondary | ICD-10-CM | POA: Diagnosis not present

## 2015-01-11 DIAGNOSIS — R0600 Dyspnea, unspecified: Secondary | ICD-10-CM

## 2015-01-11 DIAGNOSIS — J449 Chronic obstructive pulmonary disease, unspecified: Secondary | ICD-10-CM | POA: Diagnosis not present

## 2015-01-11 LAB — CBC WITH DIFFERENTIAL/PLATELET
Basophils Absolute: 0 10*3/uL (ref 0.0–0.1)
Basophils Relative: 0 % (ref 0–1)
Eosinophils Absolute: 0 10*3/uL (ref 0.0–0.7)
Eosinophils Relative: 0 % (ref 0–5)
HCT: 38.1 % (ref 36.0–46.0)
Hemoglobin: 12.9 g/dL (ref 12.0–15.0)
LYMPHS ABS: 0.7 10*3/uL (ref 0.7–4.0)
LYMPHS PCT: 3 % — AB (ref 12–46)
MCH: 29.9 pg (ref 26.0–34.0)
MCHC: 33.9 g/dL (ref 30.0–36.0)
MCV: 88.2 fL (ref 78.0–100.0)
Monocytes Absolute: 0.5 10*3/uL (ref 0.1–1.0)
Monocytes Relative: 2 % — ABNORMAL LOW (ref 3–12)
NEUTROS ABS: 18.5 10*3/uL — AB (ref 1.7–7.7)
NEUTROS PCT: 95 % — AB (ref 43–77)
PLATELETS: 741 10*3/uL — AB (ref 150–400)
RBC: 4.32 MIL/uL (ref 3.87–5.11)
RDW: 13.6 % (ref 11.5–15.5)
WBC: 19.7 10*3/uL — ABNORMAL HIGH (ref 4.0–10.5)

## 2015-01-11 LAB — BASIC METABOLIC PANEL
ANION GAP: 13 (ref 5–15)
BUN: 9 mg/dL (ref 6–20)
CALCIUM: 8.5 mg/dL — AB (ref 8.9–10.3)
CHLORIDE: 90 mmol/L — AB (ref 101–111)
CO2: 30 mmol/L (ref 22–32)
Creatinine, Ser: 0.58 mg/dL (ref 0.44–1.00)
GFR calc Af Amer: >60 mL/min (ref >60)
GFR calc non Af Amer: >60 mL/min (ref >60)
Glucose, Bld: 178 mg/dL — ABNORMAL HIGH (ref 65–99)
POTASSIUM: 3.7 mmol/L (ref 3.5–5.1)
Sodium: 133 mmol/L — ABNORMAL LOW (ref 135–145)

## 2015-01-11 LAB — I-STAT TROPONIN, ED: Troponin i, poc: 0 ng/mL (ref 0.00–0.08)

## 2015-01-11 LAB — BRAIN NATRIURETIC PEPTIDE: B Natriuretic Peptide: 44.1 pg/mL (ref 0.0–100.0)

## 2015-01-11 MED ORDER — ONDANSETRON HCL 4 MG PO TABS: 4.0000 mg | ORAL_TABLET | Freq: Four times a day (QID) | ORAL | Status: DC | PRN

## 2015-01-11 MED ORDER — AMLODIPINE BESY-BENAZEPRIL HCL 5-40 MG PO CAPS: 1.0000 | ORAL_CAPSULE | Freq: Every day | ORAL | Status: DC

## 2015-01-11 MED ORDER — SODIUM CHLORIDE 0.9 % IV SOLN
INTRAVENOUS | Status: DC
  Administered 2015-01-12: 01:00:00 via INTRAVENOUS

## 2015-01-11 MED ORDER — IPRATROPIUM BROMIDE 0.02 % IN SOLN: 0.5000 mg | Freq: Four times a day (QID) | RESPIRATORY_TRACT | Status: DC

## 2015-01-11 MED ORDER — SODIUM CHLORIDE 0.9 % IV SOLN
500.0000 mg | INTRAVENOUS | Status: DC
  Administered 2015-01-12 – 2015-01-13 (×2): 500 mg via INTRAVENOUS
  Filled 2015-01-11 (×3): qty 500

## 2015-01-11 MED ORDER — ENOXAPARIN SODIUM 40 MG/0.4ML ~~LOC~~ SOLN
40.0000 mg | SUBCUTANEOUS | Status: DC
  Administered 2015-01-11: 40 mg via SUBCUTANEOUS
  Filled 2015-01-11 (×2): qty 0.4

## 2015-01-11 MED ORDER — ALBUTEROL SULFATE (2.5 MG/3ML) 0.083% IN NEBU: 2.5000 mg | INHALATION_SOLUTION | Freq: Four times a day (QID) | RESPIRATORY_TRACT | Status: DC

## 2015-01-11 MED ORDER — AMLODIPINE BESYLATE 5 MG PO TABS
5.0000 mg | ORAL_TABLET | Freq: Every day | ORAL | Status: DC
  Administered 2015-01-12 – 2015-01-14 (×3): 5 mg via ORAL
  Filled 2015-01-11 (×3): qty 1

## 2015-01-11 MED ORDER — ALBUTEROL SULFATE (2.5 MG/3ML) 0.083% IN NEBU: 2.5000 mg | INHALATION_SOLUTION | RESPIRATORY_TRACT | Status: DC | PRN

## 2015-01-11 MED ORDER — BENAZEPRIL HCL 40 MG PO TABS
40.0000 mg | ORAL_TABLET | Freq: Every day | ORAL | Status: DC
  Administered 2015-01-12 – 2015-01-14 (×3): 40 mg via ORAL
  Filled 2015-01-11 (×3): qty 1

## 2015-01-11 MED ORDER — IPRATROPIUM-ALBUTEROL 0.5-2.5 (3) MG/3ML IN SOLN
3.0000 mL | Freq: Four times a day (QID) | RESPIRATORY_TRACT | Status: DC
  Administered 2015-01-11 – 2015-01-12 (×3): 3 mL via RESPIRATORY_TRACT
  Filled 2015-01-11 (×3): qty 3

## 2015-01-11 MED ORDER — ALBUTEROL (5 MG/ML) CONTINUOUS INHALATION SOLN
10.0000 mg/h | INHALATION_SOLUTION | Freq: Once | RESPIRATORY_TRACT | Status: AC
  Administered 2015-01-11: 10 mg/h via RESPIRATORY_TRACT
  Filled 2015-01-11: qty 20

## 2015-01-11 MED ORDER — VANCOMYCIN HCL IN DEXTROSE 1-5 GM/200ML-% IV SOLN
1000.0000 mg | Freq: Once | INTRAVENOUS | Status: AC
Start: 2015-01-11 — End: 2015-01-11
  Administered 2015-01-11: 1000 mg via INTRAVENOUS
  Filled 2015-01-11: qty 200

## 2015-01-11 MED ORDER — PIPERACILLIN-TAZOBACTAM 3.375 G IVPB
3.3750 g | Freq: Once | INTRAVENOUS | Status: AC
  Administered 2015-01-11: 3.375 g via INTRAVENOUS
  Filled 2015-01-11: qty 50

## 2015-01-11 MED ORDER — METHYLPREDNISOLONE SODIUM SUCC 125 MG IJ SOLR
60.0000 mg | Freq: Four times a day (QID) | INTRAMUSCULAR | Status: DC
  Administered 2015-01-11 – 2015-01-14 (×11): 60 mg via INTRAVENOUS
  Filled 2015-01-11: qty 2
  Filled 2015-01-11 (×13): qty 0.96
  Filled 2015-01-11: qty 2

## 2015-01-11 MED ORDER — ONDANSETRON HCL 4 MG/2ML IJ SOLN: 4.0000 mg | Freq: Four times a day (QID) | INTRAMUSCULAR | Status: DC | PRN

## 2015-01-11 MED ORDER — METHYLPREDNISOLONE SODIUM SUCC 125 MG IJ SOLR
125.0000 mg | Freq: Once | INTRAMUSCULAR | Status: AC
  Administered 2015-01-11: 125 mg via INTRAVENOUS
  Filled 2015-01-11: qty 2

## 2015-01-11 MED ORDER — PRAVASTATIN SODIUM 40 MG PO TABS
40.0000 mg | ORAL_TABLET | Freq: Every day | ORAL | Status: DC
  Administered 2015-01-11 – 2015-01-13 (×3): 40 mg via ORAL
  Filled 2015-01-11 (×4): qty 1

## 2015-01-11 MED ORDER — PIPERACILLIN-TAZOBACTAM 3.375 G IVPB
3.3750 g | Freq: Three times a day (TID) | INTRAVENOUS | Status: DC
  Administered 2015-01-12 – 2015-01-14 (×8): 3.375 g via INTRAVENOUS
  Filled 2015-01-11 (×10): qty 50

## 2015-01-11 MED ORDER — GUAIFENESIN ER 600 MG PO TB12
600.0000 mg | ORAL_TABLET | Freq: Two times a day (BID) | ORAL | Status: DC
  Administered 2015-01-11 – 2015-01-12 (×2): 600 mg via ORAL
  Filled 2015-01-11 (×3): qty 1

## 2015-01-11 MED ORDER — IPRATROPIUM BROMIDE 0.02 % IN SOLN
0.5000 mg | Freq: Once | RESPIRATORY_TRACT | Status: AC
  Administered 2015-01-11: 0.5 mg via RESPIRATORY_TRACT
  Filled 2015-01-11: qty 2.5

## 2015-01-11 MED ORDER — IOHEXOL 350 MG/ML SOLN
80.0000 mL | Freq: Once | INTRAVENOUS | Status: AC | PRN
  Administered 2015-01-11: 80 mL via INTRAVENOUS

## 2015-01-11 MED ORDER — METOPROLOL SUCCINATE ER 25 MG PO TB24
25.0000 mg | ORAL_TABLET | Freq: Every day | ORAL | Status: DC
  Administered 2015-01-12 – 2015-01-14 (×4): 25 mg via ORAL
  Filled 2015-01-11 (×4): qty 1

## 2015-01-11 NOTE — H&P (Signed)
PCP:   Beatrice Lecher, MD   Chief Complaint:  Shortness of breath  HPI: 74 year female who   has a past medical history of COPD (chronic obstructive pulmonary disease); Hypertension; Pneumonia; DVT (deep venous thrombosis); Pinched nerve; Peptic ulcer; Tubular adenoma of colon (09/2011); H/O hiatal hernia; Anemia; Diverticulosis; Hyperlipidemia; Shortness of breath dyspnea; Fluttering sensation of heart; GERD (gastroesophageal reflux disease); Liver lesion; Wears glasses; Complication of anesthesia; PONV (postoperative nausea and vomiting); Skipped heart beats; On home oxygen therapy; Small bowel obstruction; Headache; Degenerative disc disease; Arthritis; and Kidney stone.  Today came to the hospital with worsening shortness of breath for past 1 week. Patient recently had surgery, exploratory laparotomy, lysis of adhesions, repair of incisional hernia on 12/24/2014. Patient says that she was discharged home on 12/29/2014 and noted later she started having shortness of breath. She was seen by her primary care provider a week ago and was prescribed prednisone and Z-Pak which did not bring much relief. Patient usually uses oxygen at bedtime, but was using constant oxygen and even then was getting short of breath on mild exertion. Patient says that usually she is able to walk up to 500 feet without oxygen and without getting short of breath.  She denies any chest pain, no nausea vomiting or diarrhea. Had low-grade temperature of 100.2 over the weekend.  She also has been coughing up whitish yellow-colored phlegm.  In the ED CT chest or done which was negative for pulmonary embolism. But CT shows areas of scarring which could represent pneumonia so patient was started on vancomycin and Zosyn for possible HCAP.   Allergies:   Allergies  Allergen Reactions  . Maxidex [Dexamethasone] Other (See Comments)    dehydration  . Advair Diskus [Fluticasone-Salmeterol]     No benefit with lungs  .  Trazodone And Nefazodone Other (See Comments)    Heart pounding  . Tylox [Oxycodone-Acetaminophen] Itching      Past Medical History  Diagnosis Date  . COPD (chronic obstructive pulmonary disease)     FVC 72%, FEV1 29%, FEV1 ratio 32% (very severe (COPD)  . Hypertension   . Pneumonia   . DVT (deep venous thrombosis)     "LLE; years ago after a surgery"  . Pinched nerve     in back  . Peptic ulcer   . Tubular adenoma of colon 09/2011  . H/O hiatal hernia   . Anemia     as a child  . Diverticulosis   . Hyperlipidemia   . Shortness of breath dyspnea     with exertion  . Fluttering sensation of heart     pt put on metoprolol as a result  . GERD (gastroesophageal reflux disease)     PMH  . Liver lesion   . Wears glasses   . Complication of anesthesia     " My blood gas dropped during surgery so I was left on a ventilator and in ICU for 3 days."  . PONV (postoperative nausea and vomiting)   . Skipped heart beats   . On home oxygen therapy     "3L; sleep w/it; use it when I rest" (12/24/2014)  . Small bowel obstruction     "several times; OR twice" (12/24/2014)  . Headache     "maybe weekly" (12/24/2014)  . Degenerative disc disease   . Arthritis     "hands" (12/24/2014)  . Kidney stone     Past Surgical History  Procedure Laterality Date  . Abdominal hysterectomy  1987  and one ovary  . Oophorectomy  1992  . Cataract extraction w/phaco Left 06/12/2012    Dr. Boykin Reaper  . Cataract extraction w/ intraocular lens  implant, bilateral Right 06/22/2012    Dr. Lester Kinsman.   Marland Kitchen Urethral dilation    . Small intestine surgery      for a blockage, no bowel was removed, just kinked from scar tissue  . Cholecystectomy N/A 08/18/2013    Procedure: LAPAROSCOPIC CHOLECYSTECTOMY;  Surgeon: Harl Bowie, MD;  Location: [facility];  Service: General;  Laterality: N/A;  . Dilation and curettage of uterus    . Tubal ligation    . Multiple tooth extractions    . Hernia repair    .  Exploratory laparotomy  12/24/2014  . Abdominal adhesion surgery  12/24/2014  . Incisional hernia repair  12/24/2014  . Laparotomy N/A 12/24/2014    Procedure: EXPLORATORY LAPAROTOMY;  Surgeon: Coralie Keens, MD;  Location: [place];  Service: General;  Laterality: N/A;  . Lysis of adhesion N/A 12/24/2014    Procedure: LYSIS OF ADHESION;  Surgeon: Coralie Keens, MD;  Location: [facility];  Service: General;  Laterality: N/A;  . Incisional hernia repair N/A 12/24/2014    Procedure: HERNIA REPAIR INCISIONAL;  Surgeon: Coralie Keens, MD;  Location: [place];  Service: General;  Laterality: N/A;    Prior to Admission medications   Medication Sig Start Date End Date Taking? Authorizing Provider  acetaminophen (TYLENOL) 500 MG tablet Take 500 mg by mouth every 6 (six) hours as needed for mild pain.   Yes Historical Provider, MD  albuterol (PROVENTIL) (2.5 MG/3ML) 0.083% nebulizer solution USE 3 ML (2.5 MG TOTAL) BY NEBULIZATION EVERY 6 HOURS AS NEEDED FOR WHEEZING OR SHORTNESS OF BREATH 07/07/14  Yes [name], MD  albuterol (VENTOLIN HFA) 108 (90 BASE) MCG/ACT inhaler Inhale 2 puffs into the lungs every 6 (six) hours as needed. 2-4 puffs inhaled every 4-6 hours 10/22/13  Yes [name], MD  amLODipine-benazepril (LOTREL) 5-40 MG per capsule TAKE 1 CAPSULE DAILY 11/23/14  Yes [name], MD  ENSURE PLUS (ENSURE PLUS) LIQD Take 2 Cans by mouth daily. For weight gain   Yes Historical Provider, MD  Fluticasone Furoate-Vilanterol 100-25 MCG/INH AEPB Inhale 1 puff into the lungs daily. 10/14/14  Yes [name], MD  HYDROcodone-acetaminophen (NORCO/VICODIN) 5-325 MG per tablet Take 1-2 tablets by mouth every 4 (four) hours as needed for moderate pain. 12/29/14  Yes Coralie Keens, MD  Linaclotide [facility]) 290 MCG CAPS capsule Take 1 capsule (290 mcg total) by mouth daily. 10/08/14  Yes Ladene Artist, MD  LORazepam (ATIVAN) 1 MG tablet Take 1 tablet (1 mg total) by mouth daily as  needed for anxiety or sleep. 10/14/14  Yes [name], MD  metoprolol succinate (TOPROL-XL) 25 MG 24 hr tablet Take 1 tablet (25 mg total) by mouth daily. 11/23/14  Yes [name], MD  Multiple Vitamin (MULTIVITAMIN WITH MINERALS) TABS tablet Take 1 tablet by mouth daily. Pt uses One-A-Day brand   Yes Historical Provider, MD  omeprazole (PRILOSEC) 40 MG capsule Take 40 mg by mouth daily.   Yes Historical Provider, MD  ondansetron (ZOFRAN-ODT) 4 MG disintegrating tablet Take 1 tablet (4 mg total) by mouth every 8 (eight) hours as needed for nausea. 01/03/15  Yes Sean Hommel, DO  OXYGEN Inhale 2-3 Units into the lungs daily.   Yes Historical Provider, MD  pravastatin (PRAVACHOL) 40 MG tablet Take 1 tablet (40 mg total)  by mouth at bedtime. 10/05/14  Yes [name], MD  predniSONE (DELTASONE) 20 MG tablet Three tabs daily days 1-3, two tabs daily days 4-6, one tab daily days 7-9, half tab daily days 10-13. 01/03/15 01/16/15 Yes Sean Hommel, DO  SPIRIVA HANDIHALER 18 MCG inhalation capsule INHALE THE CONTENTS OF 1 CAPSULE WITH 2 INHALATIONS ONLY ONCE DAILY 10/26/14  Yes [name], MD  doxycycline (VIBRAMYCIN) 100 MG capsule Take 1 capsule (100 mg total) by mouth 2 (two) times daily. Take with food. Patient not taking: Reported on 01/11/2015 12/15/14   Kandra Nicolas, MD    Social History:  reports that she has been smoking Cigarettes.  She has a 14.25 pack-year smoking history. She has never used smokeless tobacco. She reports that she does not drink alcohol or use illicit drugs.  Family History  Problem Relation Age of Onset  . Hypertension Mother   . Ovarian cancer Mother   . Glaucoma Mother   . Colon cancer Neg Hx   . Colon polyps Neg Hx   . Diabetes Neg Hx   . Kidney disease Neg Hx   . Gallbladder disease Neg Hx   . Esophageal cancer Neg Hx   . Glaucoma Sister      All the positives are listed in BOLD  Review of Systems:  HEENT: Headache, blurred  vision, runny nose, sore throat Neck: Hypothyroidism, hyperthyroidism,,lymphadenopathy Chest : Shortness of breath, history of COPD, Asthma Heart : Chest pain, history of coronary arterey disease GI:  Nausea, vomiting, diarrhea, constipation, GERD GU: Dysuria, urgency, frequency of urination, hematuria Neuro: Stroke, seizures, syncope Psych: Depression, anxiety, hallucinations   Physical Exam: Blood pressure 157/68, pulse 104, temperature 98.6 F (37 C), temperature source Oral, resp. rate 23, SpO2 93 %. Constitutional:   Patient is a malnourished appearing female  in no acute distress and cooperative with exam. Head: Normocephalic and atraumatic Mouth: Mucus membranes moist Eyes: PERRL, EOMI, conjunctivae normal Neck: Supple, No Thyromegaly Cardiovascular: RRR, S1 normal, S2 normal Pulmonary/Chest: diffuse bilateral wheezing on auscultation Abdominal: Soft. Non-tender, non-distended, bowel sounds are normal, no masses, organomegaly, or guarding present.  Neurological: A&O x3, Strength is normal and symmetric bilaterally, cranial nerve II-XII are grossly intact, no focal motor deficit, sensory intact to light touch bilaterally.  Extremities : No Cyanosis, Clubbing or Edema  Labs on Admission:  Basic Metabolic Panel:  Recent Labs Lab 01/11/15 1235  NA 133*  K 3.7  CL 90*  CO2 30  GLUCOSE 178*  BUN 9  CREATININE 0.58  CALCIUM 8.5*   CBC:  Recent Labs Lab 01/11/15 1235  WBC 19.7*  NEUTROABS 18.5*  HGB 12.9  HCT 38.1  MCV 88.2  PLT 741*   Cardiac Enzymes: No results for input(s): CKTOTAL, CKMB, CKMBINDEX, TROPONINI in the last 168 hours.  BNP (last 3 results)  Recent Labs  01/11/15 1235  BNP 44.1      Radiological Exams on Admission: Dg Chest 2 View  01/11/2015   CLINICAL DATA:  Short of breath  EXAM: CHEST  2 VIEW  COMPARISON:  01/03/2015  FINDINGS: Marked COPD with pulmonary hyperinflation as noted previously. Negative for pneumonia. Negative for heart  failure or effusion.  IMPRESSION: Advanced COPD without acute radiographic abnormality.   Electronically Signed   By: Franchot Gallo M.D.   On: 01/11/2015 13:42   Ct Angio Chest Pe W/cm &/or Wo Cm  01/11/2015   CLINICAL DATA:  Short of breath.  COPD.  EXAM: CT ANGIOGRAPHY CHEST WITH  CONTRAST  TECHNIQUE: Multidetector CT imaging of the chest was performed using the standard protocol during bolus administration of intravenous contrast. Multiplanar CT image reconstructions and MIPs were obtained to evaluate the vascular anatomy.  CONTRAST:  3m OMNIPAQUE IOHEXOL 350 MG/ML SOLN  COMPARISON:  Chest x-ray from today  FINDINGS: Negative for pulmonary embolism. Pulmonary arteries are not enlarged. Negative for aortic dissection or aneurysm. Mild atherosclerotic calcification in the aortic arch. No significant coronary calcification. Heart size is normal. No pericardial effusion  Severe COPD and emphysema with marked hyperinflation. Mild nodular densities in both lung bases could represent scarring or pneumonitis. This does not appear to represent pneumonia on today's chest x-ray but correlation with symptoms is suggested. No pleural effusion  Negative for mass or adenopathy.  No pleural effusion  Negative upper abdomen aside from atherosclerotic disease in the aorta.  Review of the MIP images confirms the above findings.  IMPRESSION: Negative for pulmonary embolism.  Severe COPD. Bibasilar nodular densities likely due to scarring however pneumonia could have a similar appearance. Correlate with any symptoms of pneumonitis.   Electronically Signed   By: CFranchot GalloM.D.   On: 01/11/2015 16:04    EKG: Independently reviewed. Sinus tachycardia    Assessment/Plan Active Problems:   HYPERTENSION, BENIGN   HCAP (healthcare-associated pneumonia)   COPD exacerbation  COPD exacerbation Patient is presenting with COPD exacerbation, has received Solu-Medrol in the ED. Will continue Solu-Medrol 60 mg IV 6 hours,  DuoNeb nebulizers every 6 hours, Mucinex 1 tablet by mouth twice a day.  Healthcare associated pneumonia CT chest shows bilateral nodal densities likely scarring versus pneumonia. Patient has been started on vancomycin and Zosyn per pharmacy consultation for possible healthcare associated pneumonia. We'll check pro-calcitonin , urinary strep no antigen, blood cultures. If all negative the antibiotic scan be narrowed down to Levaquin in next 24-48 hours.   Leukocytosis Likely from prednisone patient was taking at home versus pneumonia. Follow blood cultures as above.  Hypertension Continue anti-hypertensive medications including Lotrel, metoprolol  Recent bowel surgery Stable  DVT prophylaxis Lovenox   Code status: full code   Family discussion: Admission, patients condition and plan of care including tests being ordered have been discussed with the patient and *her husband at bedside* who indicate understanding and agree with the plan and Code Status.   Time Spent on Admission: 65 min  [facility] Pager: [phone]/21/2016, 5:12 PM  If 7PM-7AM, please contact night-coverage  [email or web address]  Password [email or web address]

## 2015-01-11 NOTE — ED Notes (Signed)
Nurse unable to take report  

## 2015-01-11 NOTE — ED Notes (Signed)
Attempted report to 3E. 

## 2015-01-11 NOTE — ED Notes (Signed)
Admitting at bedside 

## 2015-01-11 NOTE — ED Provider Notes (Signed)
CSN: [id number]     Arrival date & time 01/11/15  1219 History   First MD Initiated Contact with Patient 01/11/15 1246     Chief Complaint  Patient presents with  . Shortness of Breath     (Consider location/radiation/quality/duration/timing/severity/associated sxs/prior Treatment) HPI Comments: Worsening shortness of breath for the past 2 weeks since being released from the hospital. Chest shin Kocher allergies when necessary but has used it continuously for the past week due to shortness of breath. No relief with broncho-dilators at home. She was on antibiotics for bronchitis.  Patient is a 74 y.o. female presenting with shortness of breath. The history is provided by the patient.  Shortness of Breath Severity:  Moderate Onset quality:  Gradual Timing:  Constant Progression:  Unchanged Chronicity:  Recurrent Context: not URI   Context comment:  Recent hospitalization for bowel obstruction Relieved by: Super Revo, however very brief, only 5 minutes of relief. Worsened by:  Nothing tried Associated symptoms: wheezing   Associated symptoms: no chest pain, no cough, no fever and no rash     Past Medical History  Diagnosis Date  . COPD (chronic obstructive pulmonary disease)     FVC 72%, FEV1 29%, FEV1 ratio 32% (very severe (COPD)  . Hypertension   . Pneumonia   . DVT (deep venous thrombosis)     "LLE; years ago after a surgery"  . Pinched nerve     in back  . Peptic ulcer   . Tubular adenoma of colon 09/2011  . H/O hiatal hernia   . Anemia     as a child  . Diverticulosis   . Hyperlipidemia   . Shortness of breath dyspnea     with exertion  . Fluttering sensation of heart     pt put on metoprolol as a result  . GERD (gastroesophageal reflux disease)     PMH  . Liver lesion   . Wears glasses   . Complication of anesthesia     " My blood gas dropped during surgery so I was left on a ventilator and in ICU for 3 days."  . PONV (postoperative nausea and vomiting)   .  Skipped heart beats   . On home oxygen therapy     "3L; sleep w/it; use it when I rest" (12/24/2014)  . Small bowel obstruction     "several times; OR twice" (12/24/2014)  . Headache     "maybe weekly" (12/24/2014)  . Degenerative disc disease   . Arthritis     "hands" (12/24/2014)  . Kidney stone    Past Surgical History  Procedure Laterality Date  . Abdominal hysterectomy  1987    and one ovary  . Oophorectomy  1992  . Cataract extraction w/phaco Left 06/12/2012    Dr. Boykin Reaper  . Cataract extraction w/ intraocular lens  implant, bilateral Right 06/22/2012    Dr. Lester Kinsman.   Marland Kitchen Urethral dilation    . Small intestine surgery      for a blockage, no bowel was removed, just kinked from scar tissue  . Cholecystectomy N/A 08/18/2013    Procedure: LAPAROSCOPIC CHOLECYSTECTOMY;  Surgeon: Harl Bowie, MD;  Location: [facility];  Service: General;  Laterality: N/A;  . Dilation and curettage of uterus    . Tubal ligation    . Multiple tooth extractions    . Hernia repair    . Exploratory laparotomy  12/24/2014  . Abdominal adhesion surgery  12/24/2014  . Incisional hernia repair  12/24/2014  .  Laparotomy N/A 12/24/2014    Procedure: EXPLORATORY LAPAROTOMY;  Surgeon: Coralie Keens, MD;  Location: [facility];  Service: General;  Laterality: N/A;  . Lysis of adhesion N/A 12/24/2014    Procedure: LYSIS OF ADHESION;  Surgeon: Coralie Keens, MD;  Location: [facility];  Service: General;  Laterality: N/A;  . Incisional hernia repair N/A 12/24/2014    Procedure: HERNIA REPAIR INCISIONAL;  Surgeon: Coralie Keens, MD;  [facility] OR;  Service: General;  Laterality: N/A;   Family History  Problem Relation Age of Onset  . Hypertension Mother   . Ovarian cancer Mother   . Glaucoma Mother   . Colon cancer Neg Hx   . Colon polyps Neg Hx   . Diabetes Neg Hx   . Kidney disease Neg Hx   . Gallbladder disease Neg Hx   . Esophageal cancer Neg Hx   . Glaucoma Sister    History  Substance Use Topics  .  Smoking status: Current Some Day Smoker -- 0.25 packs/day for 57 years    Types: Cigarettes  . Smokeless tobacco: Never Used  . Alcohol Use: No   OB History    No data available     Review of Systems  Constitutional: Negative for fever.  Respiratory: Positive for shortness of breath and wheezing. Negative for cough.   Cardiovascular: Negative for chest pain.  Skin: Negative for rash.  Neurological: Negative for dizziness.  All other systems reviewed and are negative.     Allergies  Maxidex; Advair diskus; Trazodone and nefazodone; and Tylox  Home Medications   Prior to Admission medications   Medication Sig Start Date End Date Taking? Authorizing Provider  acetaminophen (TYLENOL) 500 MG tablet Take 500 mg by mouth every 6 (six) hours as needed for mild pain.   Yes Historical Provider, MD  albuterol (PROVENTIL) (2.5 MG/3ML) 0.083% nebulizer solution USE 3 ML (2.5 MG TOTAL) BY NEBULIZATION EVERY 6 HOURS AS NEEDED FOR WHEEZING OR SHORTNESS OF BREATH 07/07/14  Yes [name], MD  albuterol (VENTOLIN HFA) 108 (90 BASE) MCG/ACT inhaler Inhale 2 puffs into the lungs every 6 (six) hours as needed. 2-4 puffs inhaled every 4-6 hours 10/22/13  Yes [name], MD  amLODipine-benazepril (LOTREL) 5-40 MG per capsule TAKE 1 CAPSULE DAILY 11/23/14  Yes [name], MD  ENSURE PLUS (ENSURE PLUS) LIQD Take 2 Cans by mouth daily. For weight gain   Yes Historical Provider, MD  Fluticasone Furoate-Vilanterol 100-25 MCG/INH AEPB Inhale 1 puff into the lungs daily. 10/14/14  Yes [name], MD  HYDROcodone-acetaminophen (NORCO/VICODIN) 5-325 MG per tablet Take 1-2 tablets by mouth every 4 (four) hours as needed for moderate pain. 12/29/14  Yes Coralie Keens, MD  Linaclotide [facility]) 290 MCG CAPS capsule Take 1 capsule (290 mcg total) by mouth daily. 10/08/14  Yes Ladene Artist, MD  LORazepam (ATIVAN) 1 MG tablet Take 1 tablet (1 mg total) by mouth daily as needed for  anxiety or sleep. 10/14/14  Yes [name], MD  metoprolol succinate (TOPROL-XL) 25 MG 24 hr tablet Take 1 tablet (25 mg total) by mouth daily. 11/23/14  Yes [name], MD  Multiple Vitamin (MULTIVITAMIN WITH MINERALS) TABS tablet Take 1 tablet by mouth daily. Pt uses One-A-Day brand   Yes Historical Provider, MD  omeprazole (PRILOSEC) 40 MG capsule Take 40 mg by mouth daily.   Yes Historical Provider, MD  ondansetron (ZOFRAN-ODT) 4 MG disintegrating tablet Take 1 tablet (4 mg total) by mouth every  8 (eight) hours as needed for nausea. 01/03/15  Yes Sean Hommel, DO  OXYGEN Inhale 2-3 Units into the lungs daily.   Yes Historical Provider, MD  pravastatin (PRAVACHOL) 40 MG tablet Take 1 tablet (40 mg total) by mouth at bedtime. 10/05/14  Yes [name], MD  predniSONE (DELTASONE) 20 MG tablet Three tabs daily days 1-3, two tabs daily days 4-6, one tab daily days 7-9, half tab daily days 10-13. 01/03/15 01/16/15 Yes Sean Hommel, DO  SPIRIVA HANDIHALER 18 MCG inhalation capsule INHALE THE CONTENTS OF 1 CAPSULE WITH 2 INHALATIONS ONLY ONCE DAILY 10/26/14  Yes [name], MD  doxycycline (VIBRAMYCIN) 100 MG capsule Take 1 capsule (100 mg total) by mouth 2 (two) times daily. Take with food. Patient not taking: Reported on 01/11/2015 12/15/14   Kandra Nicolas, MD   BP 140/67 mmHg  Pulse 97  Temp(Src) 98.6 F (37 C) (Oral)  Resp 19  SpO2 98% Physical Exam  Constitutional: She is oriented to person, place, and time. She appears well-developed and well-nourished. No distress.  HENT:  Head: Normocephalic and atraumatic.  Mouth/Throat: Oropharynx is clear and moist.  Eyes: EOM are normal. Pupils are equal, round, and reactive to light.  Neck: Normal range of motion. Neck supple.  Cardiovascular: Normal rate and regular rhythm.  Exam reveals no friction rub.   No murmur heard. Pulmonary/Chest: Effort normal. No respiratory distress. She has wheezes (expiratory). She has no  rales.  Abdominal: Soft. She exhibits no distension. There is no tenderness. There is no rebound.  Musculoskeletal: Normal range of motion. She exhibits no edema.  Neurological: She is alert and oriented to person, place, and time.  Skin: She is not diaphoretic.  Nursing note and vitals reviewed.   ED Course  Procedures (including critical care time) Labs Review Labs Reviewed  BASIC METABOLIC PANEL - Abnormal; Notable for the following:    Sodium 133 (*)    Chloride 90 (*)    Glucose, Bld 178 (*)    Calcium 8.5 (*)    All other components within normal limits  CBC WITH DIFFERENTIAL/PLATELET - Abnormal; Notable for the following:    WBC 19.7 (*)    Platelets 741 (*)    Neutrophils Relative % 95 (*)    Neutro Abs 18.5 (*)    Lymphocytes Relative 3 (*)    Monocytes Relative 2 (*)    All other components within normal limits  CULTURE, BLOOD (ROUTINE X 2)  CULTURE, BLOOD (ROUTINE X 2)  BRAIN NATRIURETIC PEPTIDE  I-STAT TROPOININ, ED    Imaging Review Dg Chest 2 View  01/11/2015   CLINICAL DATA:  Short of breath  EXAM: CHEST  2 VIEW  COMPARISON:  01/03/2015  FINDINGS: Marked COPD with pulmonary hyperinflation as noted previously. Negative for pneumonia. Negative for heart failure or effusion.  IMPRESSION: Advanced COPD without acute radiographic abnormality.   Electronically Signed   By: Franchot Gallo M.D.   On: 01/11/2015 13:42   Ct Angio Chest Pe W/cm &/or Wo Cm  01/11/2015   CLINICAL DATA:  Short of breath.  COPD.  EXAM: CT ANGIOGRAPHY CHEST WITH CONTRAST  TECHNIQUE: Multidetector CT imaging of the chest was performed using the standard protocol during bolus administration of intravenous contrast. Multiplanar CT image reconstructions and MIPs were obtained to evaluate the vascular anatomy.  CONTRAST:  14m OMNIPAQUE IOHEXOL 350 MG/ML SOLN  COMPARISON:  Chest x-ray from today  FINDINGS: Negative for pulmonary embolism. Pulmonary arteries are not enlarged. Negative  for aortic  dissection or aneurysm. Mild atherosclerotic calcification in the aortic arch. No significant coronary calcification. Heart size is normal. No pericardial effusion  Severe COPD and emphysema with marked hyperinflation. Mild nodular densities in both lung bases could represent scarring or pneumonitis. This does not appear to represent pneumonia on today's chest x-ray but correlation with symptoms is suggested. No pleural effusion  Negative for mass or adenopathy.  No pleural effusion  Negative upper abdomen aside from atherosclerotic disease in the aorta.  Review of the MIP images confirms the above findings.  IMPRESSION: Negative for pulmonary embolism.  Severe COPD. Bibasilar nodular densities likely due to scarring however pneumonia could have a similar appearance. Correlate with any symptoms of pneumonitis.   Electronically Signed   By: Franchot Gallo M.D.   On: 01/11/2015 16:04     EKG Interpretation   Date/Time:  Tuesday January 11 2015 12:24:28 EDT Ventricular Rate:  113 PR Interval:  114 QRS Duration: 66 QT Interval:  292 QTC Calculation: 400 R Axis:   78 Text Interpretation:  Sinus tachycardia with occasional Premature  ventricular complexes Biatrial enlargement Nonspecific ST and T wave  abnormality Abnormal ECG T wave depression anteriorly, new Confirmed by  Mingo Amber  MD, [place] ([id number]) on 01/11/2015 12:49:28 PM      MDM   Final diagnoses:  HCAP (healthcare-associated pneumonia)  Dyspnea    74 year old female here shortness of breath. Worsening daily since having bowel obstruction surgery about 2 weeks ago. Patient has in the meantime been on [place] for bronchitis and finished sterilely taper. She states worsening shortness of breath despite these treatments. She does have history of COPD. She states she uses her inhalers which then give her relief for only about 5 minutes. She denies any chest pain, fever, headache, blurry vision. Here vitals are stable. She is on 4 L of oxygen. She  has diffuse for wheezing and prolonged x-ray phase. I'm concerned for possible COPD exacerbation. Since she was recent hospital scan for PE as she has remote history of DVT. Negative CT for PE, however she has scarring that could be pneumonia. She has a white count, although she's been on a steroid taper. She has reported worsening SOB and no major increase after 1 hour long albuterol treatment. Will treat with antibiotics for HCAP and admit.  I have reviewed all labs and imaging and considered them in my medical decision making.    Evelina Bucy, MD 01/11/15 1626

## 2015-01-11 NOTE — ED Notes (Signed)
Pt sts increased SOB and supplemental O2 demand; pt sts on pred and antibiotics and still having SOB with congestion

## 2015-01-11 NOTE — ED Notes (Unsigned)
Tamara Adams came to the clinic c/o SOB. She is currently using 4L of O2, she usually uses her O2 prn and at night on 2-3L. Vitals BP182/84, P-111,R-24, T-98.6, O2-89-91% w/ 4L O2. She recently completed Zpak and is tapering down on Prednisone. She did a nebulizer treatment at home just before arriving to the clinic. Dr. Georgina Snell spoke to the pt advised her we could check her in and send her to the ED via EMS. Pt refused. She will have her husband drive her to [facility] ED. Spoke to nurse at [facility] ED report given. Charna Archer, LPN

## 2015-01-11 NOTE — Consult Note (Signed)
ANTIBIOTIC CONSULT NOTE - INITIAL  Pharmacy Consult for Vancomycin and Zosyn Indication: HCAP  Allergies  Allergen Reactions  . Maxidex [Dexamethasone] Other (See Comments)    dehydration  . Advair Diskus [Fluticasone-Salmeterol]     No benefit with lungs  . Trazodone And Nefazodone Other (See Comments)    Heart pounding  . Tylox [Oxycodone-Acetaminophen] Itching    Patient Measurements: Weight 38.6kg Height 5'4''  Vital Signs: Temp: 98.6 F (37 C) (06/21 1231) Temp Source: Oral (06/21 1231) BP: 157/68 mmHg (06/21 1700) Pulse Rate: 104 (06/21 1700) Intake/Output from previous day:   Intake/Output from this shift:    Labs:  Recent Labs  01/11/15 1235  WBC 19.7*  HGB 12.9  PLT 741*  CREATININE 0.58   Estimated Creatinine Clearance: 38.2 mL/min (by C-G formula based on Cr of 0.58).  Microbiology: No results found for this or any previous visit (from the past 720 hour(s)).  Medical History: Past Medical History  Diagnosis Date  . COPD (chronic obstructive pulmonary disease)     FVC 72%, FEV1 29%, FEV1 ratio 32% (very severe (COPD)  . Hypertension   . Pneumonia   . DVT (deep venous thrombosis)     "LLE; years ago after a surgery"  . Pinched nerve     in back  . Peptic ulcer   . Tubular adenoma of colon 09/2011  . H/O hiatal hernia   . Anemia     as a child  . Diverticulosis   . Hyperlipidemia   . Shortness of breath dyspnea     with exertion  . Fluttering sensation of heart     pt put on metoprolol as a result  . GERD (gastroesophageal reflux disease)     PMH  . Liver lesion   . Wears glasses   . Complication of anesthesia     " My blood gas dropped during surgery so I was left on a ventilator and in ICU for 3 days."  . PONV (postoperative nausea and vomiting)   . Skipped heart beats   . On home oxygen therapy     "3L; sleep w/it; use it when I rest" (12/24/2014)  . Small bowel obstruction     "several times; OR twice" (12/24/2014)  . Headache     "maybe weekly" (12/24/2014)  . Degenerative disc disease   . Arthritis     "hands" (12/24/2014)  . Kidney stone    Assessment: 73yof with recent ex lap on 6/3, discharged from the hospital on 6/8 returns to the ED today with shortness of breath. CXR cannot exclude pneumonia. She will begin vancomycin and zosyn. Vancomycin 1g and Zosyn 3.375g to be given in the ED. Renal function wnl.  Goal of Therapy:  Vancomycin trough level 15-20 mcg/ml  Plan:  1) Vancomycin '500mg'$  IV q24 2) Zosyn 3.375g IV q8 (4 hour infusion) 3) Follow renal function, cultures, LOT, level if needed  Deboraha Sprang 01/11/2015,5:28 PM

## 2015-01-12 DIAGNOSIS — J189 Pneumonia, unspecified organism: Principal | ICD-10-CM

## 2015-01-12 DIAGNOSIS — D72829 Elevated white blood cell count, unspecified: Secondary | ICD-10-CM | POA: Diagnosis present

## 2015-01-12 DIAGNOSIS — I10 Essential (primary) hypertension: Secondary | ICD-10-CM

## 2015-01-12 DIAGNOSIS — J441 Chronic obstructive pulmonary disease with (acute) exacerbation: Secondary | ICD-10-CM

## 2015-01-12 LAB — COMPREHENSIVE METABOLIC PANEL
ALK PHOS: 61 U/L (ref 38–126)
ALT: 15 U/L (ref 14–54)
AST: 17 U/L (ref 15–41)
Albumin: 2.7 g/dL — ABNORMAL LOW (ref 3.5–5.0)
Anion gap: 10 (ref 5–15)
BILIRUBIN TOTAL: 0.4 mg/dL (ref 0.3–1.2)
BUN: 9 mg/dL (ref 6–20)
CHLORIDE: 94 mmol/L — AB (ref 101–111)
CO2: 30 mmol/L (ref 22–32)
Calcium: 8.7 mg/dL — ABNORMAL LOW (ref 8.9–10.3)
Creatinine, Ser: 0.64 mg/dL (ref 0.44–1.00)
GFR calc Af Amer: >60 mL/min (ref >60)
GFR calc non Af Amer: >60 mL/min (ref >60)
Glucose, Bld: 159 mg/dL — ABNORMAL HIGH (ref 65–99)
Potassium: 4.3 mmol/L (ref 3.5–5.1)
Sodium: 134 mmol/L — ABNORMAL LOW (ref 135–145)
Total Protein: 5.2 g/dL — ABNORMAL LOW (ref 6.5–8.1)

## 2015-01-12 LAB — CBC
HCT: 33.8 % — ABNORMAL LOW (ref 36.0–46.0)
Hemoglobin: 11.3 g/dL — ABNORMAL LOW (ref 12.0–15.0)
MCH: 29.3 pg (ref 26.0–34.0)
MCHC: 33.4 g/dL (ref 30.0–36.0)
MCV: 87.6 fL (ref 78.0–100.0)
PLATELETS: 688 10*3/uL — AB (ref 150–400)
RBC: 3.86 MIL/uL — ABNORMAL LOW (ref 3.87–5.11)
RDW: 13.9 % (ref 11.5–15.5)
WBC: 11.9 10*3/uL — AB (ref 4.0–10.5)

## 2015-01-12 MED ORDER — GUAIFENESIN ER 600 MG PO TB12
1200.0000 mg | ORAL_TABLET | Freq: Two times a day (BID) | ORAL | Status: DC
  Administered 2015-01-12 – 2015-01-14 (×4): 1200 mg via ORAL
  Filled 2015-01-12 (×5): qty 2

## 2015-01-12 MED ORDER — ENOXAPARIN SODIUM 30 MG/0.3ML ~~LOC~~ SOLN
20.0000 mg | SUBCUTANEOUS | Status: DC
  Administered 2015-01-12 – 2015-01-13 (×2): 20 mg via SUBCUTANEOUS
  Filled 2015-01-12 (×4): qty 0.2

## 2015-01-12 MED ORDER — IPRATROPIUM-ALBUTEROL 0.5-2.5 (3) MG/3ML IN SOLN
3.0000 mL | Freq: Three times a day (TID) | RESPIRATORY_TRACT | Status: DC
  Administered 2015-01-12 – 2015-01-14 (×6): 3 mL via RESPIRATORY_TRACT
  Filled 2015-01-12 (×7): qty 3

## 2015-01-12 NOTE — Progress Notes (Signed)
[facility] PROGRESS NOTE   Kiersten Coss RDE:[id number] DOB: 07-20-1941 DOA: 01/11/2015 PCP: Beatrice Lecher, MD  HPI/Subjective: Tamara Adams is a 74 year female with PMH significant for COPD; HTN; PNA; DVT; Tubular adenoma of colon (09/2011); HLD and small bowel obstruction presented to the ED with worsening shortness of breath x 1 week. 12/24/2014 had exploratory laparotomy, lysis of adhesions, repair of incisional hernia. Patient was discharged home on 12/29/2014 and noted later she started having SOB. One week later PCP prescribed prednisone and Z-Pak for bronchitis, did not relieve symptoms much. Normally on oxygen at bedtime, but was using constant oxygen and still SOB with mild exertion. Patient says she's usually able to walk up to 500 feet without oxygen without getting short of breath. In the ED CT chest was negative for pulmonary embolism but shows areas of scarring which could represent pneumonia so patient was started on Vancomycin and Zosyn for possible HCAP. Had low-grade temperature of 100.2 over the weekend, increasing suspicion of PNA.  Today she says she is still short of breath, denies any chest pain, nausea, vomiting or diarrhea. She is still coughing and producing whitish yellow-colored phlegm.   Assessment/Plan: Active Problems:   HYPERTENSION, BENIGN   HCAP (healthcare-associated pneumonia)   COPD exacerbation   Leukocytosis   HCAP - Low-grade fever 100.2 and SOB likely due to PNA. - CT angio was negative for PE, showed bibasilar scarring but pneumonia could have similar appearance. - Continue vancomycin, zosyn (monitor renal function) - Cultures pending  COPD exacerbation - Exacerbation due to HCAP - 96% O2 sat on 3L nasal cannula - Continue Duoneb TID, IV Solu-Medrol and the antibiotics for the pneumonia.  Leukocytosis - Likely due to HCAP - WBC 19.7 on admission, today 11.9 - Continue monitoring CBC and IV antibiotics  HYPERTENSION - BP ranging  from 126/66 to 162/84 since admission - Continue monitoring and home medication regimen   Code Status: Full Code Family Communication: Plan discussed with the patient. Disposition Plan: Remains inpatient Diet: Diet 2 gram sodium Room service appropriate?: Yes; Fluid consistency:: Thin  Consultants:  Pharmacy  Antibiotics:  Vancomycin, Zosyn since 01/11/2015  Objective: Filed Vitals:   01/12/15 1100  BP: 162/84  Pulse: 85  Temp:   Resp: 16    Intake/Output Summary (Last 24 hours) at 01/12/15 1218 Last data filed at 01/12/15 0900  Gross per 24 hour  Intake    702 ml  Output    300 ml  Net    402 ml   Filed Weights   01/11/15 2041 01/12/15 0524  Weight: 37.1 kg (81 lb 12.7 oz) 37.376 kg (82 lb 6.4 oz)    Exam: General: Alert and awake, oriented x3, not in any acute distress, very thin and chronically-ill appearance HEENT: anicteric sclera, PERRL, EOMI CVS: RRR, no murmur rubs or gallops Chest: Barrel chest, decreased breath sounds with mild rhonchi bilaterally, no wheezing Abdomen: soft, nontender, nondistended, normal bowel sounds, no organomegaly Extremities: no cyanosis, clubbing or edema noted bilaterally Neuro: Grossly intact, no focal neurological deficits  Data Reviewed: Basic Metabolic Panel:  Recent Labs Lab 01/11/15 1235 01/12/15 0438  NA 133* 134*  K 3.7 4.3  CL 90* 94*  CO2 30 30  GLUCOSE 178* 159*  BUN 9 9  CREATININE 0.58 0.64  CALCIUM 8.5* 8.7*   Liver Function Tests:  Recent Labs Lab 01/12/15 0438  AST 17  ALT 15  ALKPHOS 61  BILITOT 0.4  PROT 5.2*  ALBUMIN 2.7*   CBC:  Recent  Labs Lab 01/11/15 1235 01/12/15 0438  WBC 19.7* 11.9*  NEUTROABS 18.5*  --   HGB 12.9 11.3*  HCT 38.1 33.8*  MCV 88.2 87.6  PLT 741* 688*   Cardiac Enzymes: No results for input(s): CKTOTAL, CKMB, CKMBINDEX, TROPONINI in the last 168 hours. BNP (last 3 results)  Recent Labs  01/11/15 1235  BNP 44.1    Studies: Dg Chest 2  View  01/11/2015   CLINICAL DATA:  Short of breath  EXAM: CHEST  2 VIEW  COMPARISON:  01/03/2015  FINDINGS: Marked COPD with pulmonary hyperinflation as noted previously. Negative for pneumonia. Negative for heart failure or effusion.  IMPRESSION: Advanced COPD without acute radiographic abnormality.   Electronically Signed   By: Franchot Gallo M.D.   On: 01/11/2015 13:42   Ct Angio Chest Pe W/cm &/or Wo Cm  01/11/2015   CLINICAL DATA:  Short of breath.  COPD.  EXAM: CT ANGIOGRAPHY CHEST WITH CONTRAST  TECHNIQUE: Multidetector CT imaging of the chest was performed using the standard protocol during bolus administration of intravenous contrast. Multiplanar CT image reconstructions and MIPs were obtained to evaluate the vascular anatomy.  CONTRAST:  50m OMNIPAQUE IOHEXOL 350 MG/ML SOLN  COMPARISON:  Chest x-ray from today  FINDINGS: Negative for pulmonary embolism. Pulmonary arteries are not enlarged. Negative for aortic dissection or aneurysm. Mild atherosclerotic calcification in the aortic arch. No significant coronary calcification. Heart size is normal. No pericardial effusion  Severe COPD and emphysema with marked hyperinflation. Mild nodular densities in both lung bases could represent scarring or pneumonitis. This does not appear to represent pneumonia on today's chest x-ray but correlation with symptoms is suggested. No pleural effusion  Negative for mass or adenopathy.  No pleural effusion  Negative upper abdomen aside from atherosclerotic disease in the aorta.  Review of the MIP images confirms the above findings.  IMPRESSION: Negative for pulmonary embolism.  Severe COPD. Bibasilar nodular densities likely due to scarring however pneumonia could have a similar appearance. Correlate with any symptoms of pneumonitis.   Electronically Signed   By: CFranchot GalloM.D.   On: 01/11/2015 16:04    Scheduled Meds: . amLODipine  5 mg Oral Daily   And  . benazepril  40 mg Oral Daily  . enoxaparin  (LOVENOX) injection  20 mg Subcutaneous Q24H  . guaiFENesin  600 mg Oral BID  . ipratropium-albuterol  3 mL Nebulization TID  . methylPREDNISolone (SOLU-MEDROL) injection  60 mg Intravenous Q6H  . metoprolol succinate  25 mg Oral Daily  . piperacillin-tazobactam (ZOSYN)  IV  3.375 g Intravenous 3 times per day  . pravastatin  40 mg Oral QHS  . vancomycin  500 mg Intravenous Q24H   Continuous Infusions: . sodium chloride 10 mL/hr at 01/12/15 0104    Time spent: 35 minutes   Hornbeck, SWinfield RastMD  [facility] Pager [phone] 7PM-7AM, please contact night-coverage at [email or web address], password [email or web address]6/22/2016, 12:18 PM  LOS: 1 day     Addendum  Patient seen and examined, chart and data base reviewed.  I agree with the above assessment and plan.  For full details please see Mrs. Hornbeck, Shana PA-S note.  I reviewed and amended the above note as appropriate.   MBirdie Hopes MD [facility] Pager: [phone]/22/2016, 2:25 PM

## 2015-01-12 NOTE — Progress Notes (Signed)
Patient arrived from [facility] ED via stretcher. A&O X 4. No complaints of pain. SR on telemetry. VS stable. Admit with HCAP. No skin breakdown noted.

## 2015-01-12 NOTE — Progress Notes (Signed)
UR completed.    Vonette Grosso W. Shardai Star, RN, BSN  Trauma/Neuro ICU Case Manager [phone] 

## 2015-01-12 NOTE — Progress Notes (Addendum)
**Note De-Identified  Obfuscation** Per RT protocol duoneb HHN has been changed to TID. PRN patients home schedule.  SAT 96% on 3 L [place],  BBS clear and diminished through out.  RRT to continue to monitor.

## 2015-01-13 LAB — BASIC METABOLIC PANEL
ANION GAP: 8 (ref 5–15)
BUN: 10 mg/dL (ref 6–20)
CO2: 31 mmol/L (ref 22–32)
CREATININE: 0.75 mg/dL (ref 0.44–1.00)
Calcium: 8.5 mg/dL — ABNORMAL LOW (ref 8.9–10.3)
Chloride: 94 mmol/L — ABNORMAL LOW (ref 101–111)
GFR calc Af Amer: >60 mL/min (ref >60)
Glucose, Bld: 124 mg/dL — ABNORMAL HIGH (ref 65–99)
POTASSIUM: 3.9 mmol/L (ref 3.5–5.1)
Sodium: 133 mmol/L — ABNORMAL LOW (ref 135–145)

## 2015-01-13 LAB — CBC
HCT: 32.3 % — ABNORMAL LOW (ref 36.0–46.0)
Hemoglobin: 10.8 g/dL — ABNORMAL LOW (ref 12.0–15.0)
MCH: 29.5 pg (ref 26.0–34.0)
MCHC: 33.4 g/dL (ref 30.0–36.0)
MCV: 88.3 fL (ref 78.0–100.0)
Platelets: 649 10*3/uL — ABNORMAL HIGH (ref 150–400)
RBC: 3.66 MIL/uL — ABNORMAL LOW (ref 3.87–5.11)
RDW: 13.9 % (ref 11.5–15.5)
WBC: 15.2 10*3/uL — ABNORMAL HIGH (ref 4.0–10.5)

## 2015-01-13 MED ORDER — LORAZEPAM 1 MG PO TABS
1.0000 mg | ORAL_TABLET | Freq: Once | ORAL | Status: AC
  Administered 2015-01-13: 1 mg via ORAL
  Filled 2015-01-13: qty 1

## 2015-01-13 MED ORDER — CALCIUM CARBONATE ANTACID 500 MG PO CHEW
1.0000 | CHEWABLE_TABLET | Freq: Three times a day (TID) | ORAL | Status: DC | PRN
  Administered 2015-01-13: 200 mg via ORAL
  Filled 2015-01-13 (×2): qty 1

## 2015-01-13 NOTE — Care Management Note (Signed)
Case Management Note  Patient Details  Name: Tamara Adams MRN: [id number] Date of Birth: Jul 14, 1941  Subjective/Objective:      Admitted with pneumonia              Action/Plan: CM following for DCP. Possibly need HHC at discharge; CM will continue to follow for discharge  Expected Discharge Date:      Possibly 01/16/2015            Expected Discharge Plan:  [place]  Discharge planning Services  CM Consult   Status of Service:  In process, will continue to follow     Sherrilyn Rist [phone] 01/13/2015, 10:51 AM

## 2015-01-13 NOTE — Progress Notes (Signed)
[facility] PROGRESS NOTE   Billy Rocco BDZ:[id number] DOB: 10/06/1940 DOA: 01/11/2015 PCP: Beatrice Lecher, MD  HPI/Subjective: Ms. Pesci is a 74 year female with PMH significant for COPD; HTN; PNA; DVT; Tubular adenoma of colon (09/2011); HLD and small bowel obstruction presented to the ED with worsening shortness of breath x 1 week. 12/24/2014 had exploratory laparotomy, lysis of adhesions, repair of incisional hernia. Patient was discharged home on 12/29/2014 and noted later she started having SOB. One week later PCP prescribed prednisone and Z-Pak for bronchitis, did not relieve symptoms much. Normally on oxygen at bedtime, but was using constant oxygen and still SOB with mild exertion. Patient says she's usually able to walk up to 500 feet without oxygen without getting short of breath. In the ED CT chest was negative for pulmonary embolism but shows areas of scarring which could represent pneumonia so patient was started on Vancomycin and Zosyn for possible HCAP. Had low-grade temperature of 100.2 over the weekend, increasing suspicion of PNA.  Today her breathing is better and she has had 2 normal bowel movements. She denies any chest pain, nausea, vomiting or diarrhea. She is still coughing and producing white phlegm occasionally, but feels she is much improved.  Assessment/Plan: Principal Problem:   HCAP (healthcare-associated pneumonia) Active Problems:   HYPERTENSION, BENIGN   COPD exacerbation   Leukocytosis   HCAP - Low-grade fever 100.2 and SOB likely due to PNA. - CT angio was negative for PE, showed bibasilar scarring but pneumonia could have similar appearance. - Continue vancomycin, zosyn (monitor renal function) - Cultures pending, no growth x 24 hrs  COPD exacerbation - Exacerbation due to HCAP - 96% O2 sat on 3L nasal cannula - Continue Duoneb TID, IV Solu-Medrol and the antibiotics for the pneumonia.  Leukocytosis - Likely due to HCAP - WBC 19.7 on  admission, today 15.2 - Continue monitoring CBC and IV antibiotics  HYPERTENSION - BP ranging from 126/66 to 162/84 since admission - Continue monitoring and home medication regimen   Code Status: Full Code Family Communication: Plan discussed with the patient. Disposition Plan: Remains inpatient Diet: Diet 2 gram sodium Room service appropriate?: Yes; Fluid consistency:: Thin  Consultants:  Pharmacy  Antibiotics:  Vancomycin, Zosyn since 01/11/2015  Objective: Filed Vitals:   01/13/15 0955  BP: 145/60  Pulse: 88  Temp: 97.8 F (36.6 C)  Resp:     Intake/Output Summary (Last 24 hours) at 01/13/15 1035 Last data filed at 01/13/15 1000  Gross per 24 hour  Intake    870 ml  Output    950 ml  Net    -80 ml   Filed Weights   01/11/15 2041 01/12/15 0524 01/13/15 0537  Weight: 37.1 kg (81 lb 12.7 oz) 37.376 kg (82 lb 6.4 oz) 38.465 kg (84 lb 12.8 oz)    Exam: General: AO x3, not in any acute distress, pleasant demeanor and very thin. HEENT: anicteric sclera, PERRL, EOMI CVS: RRR, no murmur rubs or gallops Chest: Barrel chest, present breath sounds with rhonchi and rales bilaterally Abdomen: soft, nontender, nondistended, normal bowel sounds, surgical incision non-erythematous and non-edematous Extremities: no cyanosis, clubbing or edema noted bilaterally Neuro: Grossly intact, no focal neurological deficits  Data Reviewed: Basic Metabolic Panel:  Recent Labs Lab 01/11/15 1235 01/12/15 0438 01/13/15 0449  NA 133* 134* 133*  K 3.7 4.3 3.9  CL 90* 94* 94*  CO2 '30 30 31  '$ GLUCOSE 178* 159* 124*  BUN '9 9 10  '$ CREATININE 0.58 0.64 0.75  CALCIUM  8.5* 8.7* 8.5*   Liver Function Tests:  Recent Labs Lab 01/12/15 0438  AST 17  ALT 15  ALKPHOS 61  BILITOT 0.4  PROT 5.2*  ALBUMIN 2.7*   CBC:  Recent Labs Lab 01/11/15 1235 01/12/15 0438 01/13/15 0449  WBC 19.7* 11.9* 15.2*  NEUTROABS 18.5*  --   --   HGB 12.9 11.3* 10.8*  HCT 38.1 33.8* 32.3*  MCV  88.2 87.6 88.3  PLT 741* 688* 649*   Cardiac Enzymes: No results for input(s): CKTOTAL, CKMB, CKMBINDEX, TROPONINI in the last 168 hours. BNP (last 3 results)  Recent Labs  01/11/15 1235  BNP 44.1    Studies: Dg Chest 2 View  01/11/2015   CLINICAL DATA:  Short of breath  EXAM: CHEST  2 VIEW  COMPARISON:  01/03/2015  FINDINGS: Marked COPD with pulmonary hyperinflation as noted previously. Negative for pneumonia. Negative for heart failure or effusion.  IMPRESSION: Advanced COPD without acute radiographic abnormality.   Electronically Signed   By: Franchot Gallo M.D.   On: 01/11/2015 13:42   Ct Angio Chest Pe W/cm &/or Wo Cm  01/11/2015   CLINICAL DATA:  Short of breath.  COPD.  EXAM: CT ANGIOGRAPHY CHEST WITH CONTRAST  TECHNIQUE: Multidetector CT imaging of the chest was performed using the standard protocol during bolus administration of intravenous contrast. Multiplanar CT image reconstructions and MIPs were obtained to evaluate the vascular anatomy.  CONTRAST:  38m OMNIPAQUE IOHEXOL 350 MG/ML SOLN  COMPARISON:  Chest x-ray from today  FINDINGS: Negative for pulmonary embolism. Pulmonary arteries are not enlarged. Negative for aortic dissection or aneurysm. Mild atherosclerotic calcification in the aortic arch. No significant coronary calcification. Heart size is normal. No pericardial effusion  Severe COPD and emphysema with marked hyperinflation. Mild nodular densities in both lung bases could represent scarring or pneumonitis. This does not appear to represent pneumonia on today's chest x-ray but correlation with symptoms is suggested. No pleural effusion  Negative for mass or adenopathy.  No pleural effusion  Negative upper abdomen aside from atherosclerotic disease in the aorta.  Review of the MIP images confirms the above findings.  IMPRESSION: Negative for pulmonary embolism.  Severe COPD. Bibasilar nodular densities likely due to scarring however pneumonia could have a similar appearance.  Correlate with any symptoms of pneumonitis.   Electronically Signed   By: CFranchot GalloM.D.   On: 01/11/2015 16:04    Scheduled Meds: . amLODipine  5 mg Oral Daily   And  . benazepril  40 mg Oral Daily  . enoxaparin (LOVENOX) injection  20 mg Subcutaneous Q24H  . guaiFENesin  1,200 mg Oral BID  . ipratropium-albuterol  3 mL Nebulization TID  . methylPREDNISolone (SOLU-MEDROL) injection  60 mg Intravenous Q6H  . metoprolol succinate  25 mg Oral Daily  . piperacillin-tazobactam (ZOSYN)  IV  3.375 g Intravenous 3 times per day  . pravastatin  40 mg Oral QHS  . vancomycin  500 mg Intravenous Q24H   Continuous Infusions: . sodium chloride 10 mL/hr at 01/12/15 0104    Time spent: 25 minutes   Hornbeck, SWinfield RastMD  [facility] Pager [phone] 7PM-7AM, please contact night-coverage at [email or web address], password [facility]6/23/2016, 10:35 AM  LOS: 2 days     Addendum  Patient seen and examined, chart and data base reviewed.  I agree with the above assessment and plan.  For full details please see Mrs. Hornbeck, Shana PA-S note.  I reviewed and amended the above  note as appropriate.   Birdie Hopes, MD [facility] Pager: [phone] 01/13/2015, 10:35 AM

## 2015-01-14 ENCOUNTER — Ambulatory Visit: Payer: Medicare Other | Admitting: Family Medicine

## 2015-01-14 ENCOUNTER — Telehealth: Payer: Self-pay | Admitting: Family Medicine

## 2015-01-14 ENCOUNTER — Encounter (HOSPITAL_COMMUNITY): Payer: Self-pay | Admitting: *Deleted

## 2015-01-14 LAB — CBC
HCT: 31.9 % — ABNORMAL LOW (ref 36.0–46.0)
Hemoglobin: 10.6 g/dL — ABNORMAL LOW (ref 12.0–15.0)
MCH: 29.3 pg (ref 26.0–34.0)
MCHC: 33.2 g/dL (ref 30.0–36.0)
MCV: 88.1 fL (ref 78.0–100.0)
PLATELETS: 638 10*3/uL — AB (ref 150–400)
RBC: 3.62 MIL/uL — AB (ref 3.87–5.11)
RDW: 13.9 % (ref 11.5–15.5)
WBC: 13.1 10*3/uL — ABNORMAL HIGH (ref 4.0–10.5)

## 2015-01-14 MED ORDER — LEVOFLOXACIN 750 MG PO TABS: 750.0000 mg | ORAL_TABLET | Freq: Every day | ORAL | Status: DC

## 2015-01-14 MED ORDER — PREDNISONE 10 MG (21) PO TBPK: ORAL_TABLET | ORAL | Status: DC

## 2015-01-14 NOTE — Telephone Encounter (Signed)
Ok for 15 slot on that Tuesday.

## 2015-01-14 NOTE — Progress Notes (Signed)
ANTIBIOTIC CONSULT NOTE - FOLLOW UP  Pharmacy Consult:  Vancomycin / Zosyn Indication:  HACP  Allergies  Allergen Reactions  . Maxidex [Dexamethasone] Other (See Comments)    dehydration  . Advair Diskus [Fluticasone-Salmeterol]     No benefit with lungs  . Trazodone And Nefazodone Other (See Comments)    Heart pounding  . Tylox [Oxycodone-Acetaminophen] Itching    Patient Measurements: Weight: 87 lb 1.3 oz (39.5 kg)  Vital Signs: Temp: 97.7 F (36.5 C) (06/24 0500) Temp Source: Oral (06/24 0500) BP: 134/70 mmHg (06/24 0500) Pulse Rate: 85 (06/24 0500) Intake/Output from previous day: 06/23 0701 - 06/24 0700 In: 3893 [P.O.:1377; I.V.:110; IV Piggyback:50] Out: 800 [Urine:800] Intake/Output from this shift: Total I/O In: 240 [P.O.:240] Out: -   Labs:  Recent Labs  01/11/15 1235 01/12/15 0438 01/13/15 0449 01/14/15 0359  WBC 19.7* 11.9* 15.2* 13.1*  HGB 12.9 11.3* 10.8* 10.6*  PLT 741* 688* 649* 638*  CREATININE 0.58 0.64 0.75  --    Estimated Creatinine Clearance: 39.1 mL/min (by C-G formula based on Cr of 0.75). No results for input(s): VANCOTROUGH, VANCOPEAK, VANCORANDOM, GENTTROUGH, GENTPEAK, GENTRANDOM, TOBRATROUGH, TOBRAPEAK, TOBRARND, AMIKACINPEAK, AMIKACINTROU, AMIKACIN in the last 72 hours.   Microbiology: Recent Results (from the past 720 hour(s))  Blood culture (routine x 2)     Status: None (Preliminary result)   Collection Time: 01/11/15  4:40 PM  Result Value Ref Range Status   Specimen Description BLOOD RIGHT ARM  Final   Special Requests BOTTLES DRAWN AEROBIC AND ANAEROBIC 5CC  Final   Culture NO GROWTH 2 DAYS  Final   Report Status PENDING  Incomplete  Blood culture (routine x 2)     Status: None (Preliminary result)   Collection Time: 01/11/15  4:45 PM  Result Value Ref Range Status   Specimen Description BLOOD RIGHT WRIST  Final   Special Requests BOTTLES DRAWN AEROBIC AND ANAEROBIC 5CC  Final   Culture NO GROWTH 2 DAYS  Final   Report  Status PENDING  Incomplete      Assessment: 25 YOF with HCAP to continue on vancomycin and Zosyn.  Patient's renal function and UOP have been stable.  Vanc 6/21 >> Zosyn 6/21 >>  6/21 BCx x2 - NGTD   Goal of Therapy:  Vancomycin trough level 15-20 mcg/ml   Plan:  - Vanc '500mg'$  IV Q24H - Zosyn 3.375g Q8H, 4 hr infusion - Monitor renal fxn, clinical progress, vanc trough soon - Consider resuming patient's home meds    Tawney Vanorman D. Mina Marble, PharmD, BCPS Pager:  [phone] 01/14/2015, 8:07 AM

## 2015-01-14 NOTE — Progress Notes (Signed)
Pt discharge education and instructions completed with pt and family at bedside; all voices understanding and denies any questions. Pt IV and telemetry removed; pt handed her prescriptions for Levaquin and prednisone; pt discharge home with family to transport her home. Pt transported off unit via wheelchair with belongings to the side. Francis Gaines Gerica Koble RN.

## 2015-01-14 NOTE — Telephone Encounter (Signed)
You have a 15 min slot available that Tuesday, is this ok to put there? Your first 72mn opening is on 7/15.

## 2015-01-14 NOTE — Discharge Summary (Signed)
Physician Discharge Summary  Tamara Adams IWL:[id number] DOB: 21-Aug-1940 DOA: 01/11/2015  PCP: Beatrice Lecher, MD  Admit date: 01/11/2015 Discharge date: 01/14/2015  Time spent: 40 minutes  Recommendations for Outpatient Follow-up:  1. Follow-up with primary care physician within one week.  Discharge Diagnoses:  Principal Problem:   HCAP (healthcare-associated pneumonia) Active Problems:   HYPERTENSION, BENIGN   COPD exacerbation   Leukocytosis   Discharge Condition: Stable  Diet recommendation: Heart healthy  Filed Weights   01/12/15 0524 01/13/15 0537 01/14/15 0500  Weight: 37.376 kg (82 lb 6.4 oz) 38.465 kg (84 lb 12.8 oz) 39.5 kg (87 lb 1.3 oz)    History of present illness:  74 year female who  has a past medical history of COPD (chronic obstructive pulmonary disease); Hypertension; Pneumonia; DVT (deep venous thrombosis); Pinched nerve; Peptic ulcer; Tubular adenoma of colon (09/2011); H/O hiatal hernia; Anemia; Diverticulosis; Hyperlipidemia; Shortness of breath dyspnea; Fluttering sensation of heart; GERD (gastroesophageal reflux disease); Liver lesion; Wears glasses; Complication of anesthesia; PONV (postoperative nausea and vomiting); Skipped heart beats; On home oxygen therapy; Small bowel obstruction; Headache; Degenerative disc disease; Arthritis; and Kidney stone. Today came to the hospital with worsening shortness of breath for past 1 week. Patient recently had surgery, exploratory laparotomy, lysis of adhesions, repair of incisional hernia on 12/24/2014. Patient says that she was discharged home on 12/29/2014 and noted later she started having shortness of breath. She was seen by her primary care provider a week ago and was prescribed prednisone and Z-Pak which did not bring much relief. Patient usually uses oxygen at bedtime, but was using constant oxygen and even then was getting short of breath on mild exertion. Patient says that usually she is able to walk up to  500 feet without oxygen and without getting short of breath.  She denies any chest pain, no nausea vomiting or diarrhea. Had low-grade temperature of 100.2 over the weekend.  She also has been coughing up whitish yellow-colored phlegm.  In the ED CT chest or done which was negative for pulmonary embolism. But CT shows areas of scarring which could represent pneumonia so patient was started on vancomycin and Zosyn for possible HCAP.  Hospital Course:   HCAP - Low-grade fever 100.2 and SOB likely due to PNA. - CT angio was negative for PE, showed bibasilar scarring but pneumonia could have similar appearance. - Continue vancomycin, zosyn (monitor renal function) - Blood and sputum culture showed no growth to date. - Discharged on levofloxacin for 5 more days.  COPD exacerbation - Exacerbation due to HCAP - 96% O2 sat on 3L nasal cannula - Placed on Duoneb TID, IV Solu-Medrol and the antibiotics for the pneumonia. - On discharge levofloxacin for 5 more days and prednisone taper, to follow-up with PCP in one week.  Leukocytosis - Likely due to HCAP - WBC 19.7 on admission, today 15.2 - Continue monitoring CBC and IV antibiotics  HYPERTENSION - BP ranging from 126/66 to 162/84 since admission - Continue monitoring and home medication regimen.  Chronic respiratory failure -Chronic respiratory failure secondary to COPD, patient is on 3 L of oxygen at home continuously. -Currently back to her baseline of 3 L of oxygen. No wheezing, no cough or shortness of breath.   Procedures:  None  Consultations:  None  Discharge Exam: Filed Vitals:   01/14/15 0812  BP: 124/56  Pulse: 84  Temp: 98.8 F (37.1 C)  Resp: 18   General: Alert and awake, oriented x3, not in any acute distress. HEENT:  anicteric sclera, pupils reactive to light and accommodation, EOMI CVS: S1-S2 clear, no murmur rubs or gallops Chest: clear to auscultation bilaterally, no wheezing, rales or  rhonchi Abdomen: soft nontender, nondistended, normal bowel sounds, no organomegaly Extremities: no cyanosis, clubbing or edema noted bilaterally Neuro: Cranial nerves II-XII intact, no focal neurological deficits  Discharge Instructions   Discharge Instructions    Diet - low sodium heart healthy    Complete by:  As directed      Increase activity slowly    Complete by:  As directed           Current Discharge Medication List    START taking these medications   Details  levofloxacin (LEVAQUIN) 750 MG tablet Take 1 tablet (750 mg total) by mouth daily. Qty: 5 tablet, Refills: 0    predniSONE (STERAPRED UNI-PAK 21 TAB) 10 MG (21) TBPK tablet Take 6-5-4-3-2-1 tablets by mouth daily until gone. Qty: 21 tablet, Refills: 0      CONTINUE these medications which have NOT CHANGED   Details  acetaminophen (TYLENOL) 500 MG tablet Take 500 mg by mouth every 6 (six) hours as needed for mild pain.    albuterol (PROVENTIL) (2.5 MG/3ML) 0.083% nebulizer solution USE 3 ML (2.5 MG TOTAL) BY NEBULIZATION EVERY 6 HOURS AS NEEDED FOR WHEEZING OR SHORTNESS OF BREATH Qty: 270 vial, Refills: 1    albuterol (VENTOLIN HFA) 108 (90 BASE) MCG/ACT inhaler Inhale 2 puffs into the lungs every 6 (six) hours as needed. 2-4 puffs inhaled every 4-6 hours Qty: 3 Inhaler, Refills: 3    amLODipine-benazepril (LOTREL) 5-40 MG per capsule TAKE 1 CAPSULE DAILY Qty: 90 capsule, Refills: 0    ENSURE PLUS (ENSURE PLUS) LIQD Take 2 Cans by mouth daily. For weight gain    Fluticasone Furoate-Vilanterol 100-25 MCG/INH AEPB Inhale 1 puff into the lungs daily. Qty: 3 each, Refills: 3    HYDROcodone-acetaminophen (NORCO/VICODIN) 5-325 MG per tablet Take 1-2 tablets by mouth every 4 (four) hours as needed for moderate pain. Qty: 30 tablet, Refills: 0    Linaclotide (LINZESS) 290 MCG CAPS capsule Take 1 capsule (290 mcg total) by mouth daily. Qty: 30 capsule, Refills: 5    LORazepam (ATIVAN) 1 MG tablet Take 1 tablet  (1 mg total) by mouth daily as needed for anxiety or sleep. Qty: 30 tablet, Refills: 1    metoprolol succinate (TOPROL-XL) 25 MG 24 hr tablet Take 1 tablet (25 mg total) by mouth daily. Qty: 90 tablet, Refills: 0    Multiple Vitamin (MULTIVITAMIN WITH MINERALS) TABS tablet Take 1 tablet by mouth daily. Pt uses One-A-Day brand    omeprazole (PRILOSEC) 40 MG capsule Take 40 mg by mouth daily.    ondansetron (ZOFRAN-ODT) 4 MG disintegrating tablet Take 1 tablet (4 mg total) by mouth every 8 (eight) hours as needed for nausea. Qty: 20 tablet, Refills: 1   Associated Diagnoses: Wheezing; SOB (shortness of breath); COPD exacerbation    OXYGEN Inhale 2-3 Units into the lungs daily.    pravastatin (PRAVACHOL) 40 MG tablet Take 1 tablet (40 mg total) by mouth at bedtime. Qty: 30 tablet, Refills: 11    SPIRIVA HANDIHALER 18 MCG inhalation capsule INHALE THE CONTENTS OF 1 CAPSULE WITH 2 INHALATIONS ONLY ONCE DAILY Qty: 1 capsule, Refills: 2      STOP taking these medications     predniSONE (DELTASONE) 20 MG tablet      doxycycline (VIBRAMYCIN) 100 MG capsule        Allergies  Allergen  Reactions  . Maxidex [Dexamethasone] Other (See Comments)    dehydration  . Advair Diskus [Fluticasone-Salmeterol]     No benefit with lungs  . Trazodone And Nefazodone Other (See Comments)    Heart pounding  . Tylox [Oxycodone-Acetaminophen] Itching   Follow-up Information    Follow up with METHENEY,CATHERINE, MD. Schedule an appointment as soon as possible for a visit on 01/21/2015.   Specialty:  Family Medicine   Why:  Merleen Nicely will send Dr Madilyn Fireman a message to determine a follow up appointment. Dr. will be on vacation next week and no availabilities. The office will call the patient with that information.   Contact information:   [address] [place] [address] [phone]        The results of significant diagnostics from this hospitalization (including imaging,  microbiology, ancillary and laboratory) are listed below for reference.    Significant Diagnostic Studies: Dg Chest 2 View  01/11/2015   CLINICAL DATA:  Short of breath  EXAM: CHEST  2 VIEW  COMPARISON:  01/03/2015  FINDINGS: Marked COPD with pulmonary hyperinflation as noted previously. Negative for pneumonia. Negative for heart failure or effusion.  IMPRESSION: Advanced COPD without acute radiographic abnormality.   Electronically Signed   By: Franchot Gallo M.D.   On: 01/11/2015 13:42   Dg Chest 2 View  01/03/2015   CLINICAL DATA:  Short of breath and wheezing.  EXAM: CHEST  2 VIEW  COMPARISON:  03/31/2014  FINDINGS: Advanced COPD with marked hyperinflation of the lungs. Mild bibasilar pleural scarring.  Negative for heart failure or edema.  Negative for pneumonia or mass lesion.  IMPRESSION: Severe COPD without evidence of pneumonia.   Electronically Signed   By: Franchot Gallo M.D.   On: 01/03/2015 11:29   Ct Angio Chest Pe W/cm &/or Wo Cm  01/11/2015   CLINICAL DATA:  Short of breath.  COPD.  EXAM: CT ANGIOGRAPHY CHEST WITH CONTRAST  TECHNIQUE: Multidetector CT imaging of the chest was performed using the standard protocol during bolus administration of intravenous contrast. Multiplanar CT image reconstructions and MIPs were obtained to evaluate the vascular anatomy.  CONTRAST:  35m OMNIPAQUE IOHEXOL 350 MG/ML SOLN  COMPARISON:  Chest x-ray from today  FINDINGS: Negative for pulmonary embolism. Pulmonary arteries are not enlarged. Negative for aortic dissection or aneurysm. Mild atherosclerotic calcification in the aortic arch. No significant coronary calcification. Heart size is normal. No pericardial effusion  Severe COPD and emphysema with marked hyperinflation. Mild nodular densities in both lung bases could represent scarring or pneumonitis. This does not appear to represent pneumonia on today's chest x-ray but correlation with symptoms is suggested. No pleural effusion  Negative for mass or  adenopathy.  No pleural effusion  Negative upper abdomen aside from atherosclerotic disease in the aorta.  Review of the MIP images confirms the above findings.  IMPRESSION: Negative for pulmonary embolism.  Severe COPD. Bibasilar nodular densities likely due to scarring however pneumonia could have a similar appearance. Correlate with any symptoms of pneumonitis.   Electronically Signed   By: CFranchot GalloM.D.   On: 01/11/2015 16:04    Microbiology: Recent Results (from the past 240 hour(s))  Blood culture (routine x 2)     Status: None (Preliminary result)   Collection Time: 01/11/15  4:40 PM  Result Value Ref Range Status   Specimen Description BLOOD RIGHT ARM  Final   Special Requests BOTTLES DRAWN AEROBIC AND ANAEROBIC 5CC  Final   Culture NO  GROWTH 2 DAYS  Final   Report Status PENDING  Incomplete  Blood culture (routine x 2)     Status: None (Preliminary result)   Collection Time: 01/11/15  4:45 PM  Result Value Ref Range Status   Specimen Description BLOOD RIGHT WRIST  Final   Special Requests BOTTLES DRAWN AEROBIC AND ANAEROBIC 5CC  Final   Culture NO GROWTH 2 DAYS  Final   Report Status PENDING  Incomplete     Labs: Basic Metabolic Panel:  Recent Labs Lab 01/11/15 1235 01/12/15 0438 01/13/15 0449  NA 133* 134* 133*  K 3.7 4.3 3.9  CL 90* 94* 94*  CO2 '30 30 31  '$ GLUCOSE 178* 159* 124*  BUN '9 9 10  '$ CREATININE 0.58 0.64 0.75  CALCIUM 8.5* 8.7* 8.5*   Liver Function Tests:  Recent Labs Lab 01/12/15 0438  AST 17  ALT 15  ALKPHOS 61  BILITOT 0.4  PROT 5.2*  ALBUMIN 2.7*   No results for input(s): LIPASE, AMYLASE in the last 168 hours. No results for input(s): AMMONIA in the last 168 hours. CBC:  Recent Labs Lab 01/11/15 1235 01/12/15 0438 01/13/15 0449 01/14/15 0359  WBC 19.7* 11.9* 15.2* 13.1*  NEUTROABS 18.5*  --   --   --   HGB 12.9 11.3* 10.8* 10.6*  HCT 38.1 33.8* 32.3* 31.9*  MCV 88.2 87.6 88.3 88.1  PLT 741* 688* 649* 638*   Cardiac  Enzymes: No results for input(s): CKTOTAL, CKMB, CKMBINDEX, TROPONINI in the last 168 hours. BNP: BNP (last 3 results)  Recent Labs  01/11/15 1235  BNP 44.1    ProBNP (last 3 results) No results for input(s): PROBNP in the last 8760 hours.  CBG: No results for input(s): GLUCAP in the last 168 hours.     Signed:  Blossom Crume A  [facility] 01/14/2015, 11:11 AM

## 2015-01-14 NOTE — Telephone Encounter (Signed)
See if we can schedule for the Tuesday i come back

## 2015-01-14 NOTE — Telephone Encounter (Signed)
[facility] called to set Pt up with a hospital follow up. She had acquired Pneumonia. Needs a f/u next week, there are no openings with Dr. Madilyn Fireman since she will be out of office. Will  Route to PCP to see if Pt can wait of if she needs to be added to another providers schedule. There are NO 30 min appts available for next week so she would need to utilize a 33mn slot.

## 2015-01-14 NOTE — Plan of Care (Signed)
Problem: Phase II Progression Outcomes Goal: Wean O2 if indicated Outcome: Completed/Met Date Met:  01/14/15 Pt on home O2.

## 2015-01-16 LAB — CULTURE, BLOOD (ROUTINE X 2)
CULTURE: NO GROWTH
CULTURE: NO GROWTH

## 2015-01-17 ENCOUNTER — Ambulatory Visit: Payer: Medicare Other | Admitting: Gastroenterology

## 2015-01-17 NOTE — Telephone Encounter (Signed)
Pt added to schedule

## 2015-01-25 ENCOUNTER — Encounter: Payer: Self-pay | Admitting: Family Medicine

## 2015-01-25 ENCOUNTER — Ambulatory Visit (INDEPENDENT_AMBULATORY_CARE_PROVIDER_SITE_OTHER): Payer: Medicare Other | Admitting: Family Medicine

## 2015-01-25 VITALS — BP 141/62 | HR 87 | Temp 98.4°F | Ht 64.0 in | Wt 86.0 lb

## 2015-01-25 DIAGNOSIS — J189 Pneumonia, unspecified organism: Secondary | ICD-10-CM

## 2015-01-25 DIAGNOSIS — R0602 Shortness of breath: Secondary | ICD-10-CM

## 2015-01-25 DIAGNOSIS — E43 Unspecified severe protein-calorie malnutrition: Secondary | ICD-10-CM

## 2015-01-25 DIAGNOSIS — R5383 Other fatigue: Secondary | ICD-10-CM

## 2015-01-25 DIAGNOSIS — R636 Underweight: Secondary | ICD-10-CM

## 2015-01-25 DIAGNOSIS — R062 Wheezing: Secondary | ICD-10-CM | POA: Diagnosis not present

## 2015-01-25 DIAGNOSIS — J441 Chronic obstructive pulmonary disease with (acute) exacerbation: Secondary | ICD-10-CM | POA: Diagnosis not present

## 2015-01-25 MED ORDER — PREDNISONE 10 MG PO TABS: ORAL_TABLET | ORAL | Status: DC

## 2015-01-25 MED ORDER — LORAZEPAM 1 MG PO TABS: 1.0000 mg | ORAL_TABLET | Freq: Every day | ORAL | Status: DC | PRN

## 2015-01-25 MED ORDER — ONDANSETRON 4 MG PO TBDP: 4.0000 mg | ORAL_TABLET | Freq: Three times a day (TID) | ORAL | Status: DC | PRN

## 2015-01-25 NOTE — Progress Notes (Signed)
   Subjective:    Patient ID: Tamara Adams, female    DOB: [birth year], 74 y.o.   MRN: [id number]  HPI Tamara Adams was admitted to [facility] in [facility] on June 21. She was discharged home 3 days later. She was diagnosed with healthcare associated pneumonia and COPD exacerbation. She had action come into the office here about a week prior for COPD exacerbation and was treated with azithromycin and prednisone. Unfortunately she did not improve on the regimen. Has surgery June 3rd for bowel blockage. She just feels really weak.  She is eating well and trying to walk.  Wears her oxygen 2/3rd of the day. Says her SOB is better.  She feels a little tight in her upper chest. She was on mucinex in the hospital.  Completed her antibiotics.  She is taking some protein powder. Doing a veggie shake with protein powder. Has been eating eggs for extra protein as well.       Review of Systems     Objective:   Physical Exam  Constitutional: She is oriented to person, place, and time. She appears well-developed and well-nourished.  HENT:  Head: Normocephalic and atraumatic.  Cardiovascular: Normal rate, regular rhythm and normal heart sounds.   Pulmonary/Chest: Effort normal. She has wheezes.  Neurological: She is alert and oriented to person, place, and time.  Skin: Skin is warm and dry.  Psychiatric: She has a normal mood and affect. Her behavior is normal.          Assessment & Plan:  Pneumonia - encouraged her to wear her oxygen more. Try to keep up the protein to help with energy and healing.  We'll work on trying to get a flutter valve for her to help remove the mucus out since the Mucinex is not working very well. I'm also want to put her on a low dose of prednisone, 10 mg for 7 days and then 5 mg for 5 days and then stop. She says she always feels better when she is on prednisone. This may help a little bit with energy level and help increase appetite.  Will call [facility]  for flutter valve.   Underweight/protein calorie malnutrition-she is still under weight. Encouraged her to continue to work on eating and getting some protein and for healing. Next  Fatigue-likely secondary to recent bout of pneumonia. Her that it can sometimes take 6-8 weeks to completely bounce back. Will give her low-dose prednisone for next 2 weeks since this does seem to help give her some energy and helps her feel better. Also encouraged her to wear her oxygen around-the-clock.  COPD exacerbation-improving, though she still seemed short of breath with talking to me but she feels better shortest of breath is actually improving. Continue low-dose prednisone.

## 2015-02-22 ENCOUNTER — Ambulatory Visit (INDEPENDENT_AMBULATORY_CARE_PROVIDER_SITE_OTHER): Payer: Medicare Other | Admitting: Family Medicine

## 2015-02-22 ENCOUNTER — Encounter: Payer: Self-pay | Admitting: Family Medicine

## 2015-02-22 VITALS — BP 140/74 | HR 89 | Ht 64.0 in | Wt 88.0 lb

## 2015-02-22 DIAGNOSIS — E43 Unspecified severe protein-calorie malnutrition: Secondary | ICD-10-CM | POA: Diagnosis not present

## 2015-02-22 DIAGNOSIS — J441 Chronic obstructive pulmonary disease with (acute) exacerbation: Secondary | ICD-10-CM

## 2015-02-22 DIAGNOSIS — I10 Essential (primary) hypertension: Secondary | ICD-10-CM

## 2015-02-22 MED ORDER — DOXYCYCLINE HYCLATE 100 MG PO TABS: 100.0000 mg | ORAL_TABLET | Freq: Two times a day (BID) | ORAL | Status: DC

## 2015-02-22 MED ORDER — PREDNISONE 20 MG PO TABS: 40.0000 mg | ORAL_TABLET | Freq: Every day | ORAL | Status: DC

## 2015-02-22 NOTE — Progress Notes (Signed)
   Subjective:    Patient ID: Tamara Adams, female    DOB: July 11, 1941, 74 y.o.   MRN: [id number]  HPI Follow-up COPD-patient was diagnosed with pneumonia about a month ago. She was actually admitted to the hospital for a three-day stay. When I last saw her she had not been using her oxygen consistently and had encouraged her to do so.  Has been staying inside with the high heat.  She is now uring 3 liters of oxygen.  Feels a little congested in her main bonchus. She also feels like it's been a little bit harder to breathe and has had some tightness in her chest.  Using her flutter and has really helped her move mucous. Using her albuterol neb BID. She is on spiriva and Breo and she uses it consistently.  No fever or chills but feels like she has been getting worse.   Underweight - she is up 2 lbs. Had her surgical f/u with Dr. Ninfa Linden.  She has really been pushing fluids and drinking more V8 juice.    Hypertension- Pt denies chest pain, SOB, dizziness, or heart palpitations.  Taking meds as directed w/o problems.  Denies medication side effects.  Her BP was running high.      Review of Systems     Objective:   Physical Exam  Constitutional: She is oriented to person, place, and time. She appears well-developed and well-nourished.  HENT:  Head: Normocephalic and atraumatic.  Cardiovascular: Normal rate, regular rhythm and normal heart sounds.   Pulmonary/Chest: Effort normal. She has wheezes.  Diffuse wheezing  Neurological: She is alert and oriented to person, place, and time.  Skin: Skin is warm and dry.  Psychiatric: She has a normal mood and affect. Her behavior is normal.          Assessment & Plan:  COPD exacerbation-we'll go ahead and treat with a round of doxycycline and prednisone. Follow-up in 6 weeks. Will send over new prescription to adjust her oxygen to 3 L.  Hypertension-it's a little bit elevated today. Will going to recheck. I also want her to keep an eye on it at  home. Consider increasing her amlodipine dose of her medication if continues to be elevated. She is really tried to get more fluids and more salt intake and that might be the cause of her elevated blood pressure.  Underweight-she's on fantastic and has gained 2 more pounds which is great.

## 2015-02-24 ENCOUNTER — Other Ambulatory Visit: Payer: Self-pay | Admitting: Family Medicine

## 2015-03-03 ENCOUNTER — Encounter: Payer: Medicare Other | Admitting: Family Medicine

## 2015-03-06 ENCOUNTER — Emergency Department (HOSPITAL_BASED_OUTPATIENT_CLINIC_OR_DEPARTMENT_OTHER): Payer: Medicare Other

## 2015-03-06 ENCOUNTER — Encounter (HOSPITAL_BASED_OUTPATIENT_CLINIC_OR_DEPARTMENT_OTHER): Payer: Self-pay | Admitting: Emergency Medicine

## 2015-03-06 ENCOUNTER — Emergency Department (HOSPITAL_BASED_OUTPATIENT_CLINIC_OR_DEPARTMENT_OTHER)
Admission: EM | Admit: 2015-03-06 | Discharge: 2015-03-07 | Disposition: A | Discharge status: Home / Self Care | Payer: Medicare Other | Attending: Emergency Medicine | Admitting: Emergency Medicine

## 2015-03-06 DIAGNOSIS — Z86018 Personal history of other benign neoplasm: Secondary | ICD-10-CM | POA: Insufficient documentation

## 2015-03-06 DIAGNOSIS — Z87828 Personal history of other (healed) physical injury and trauma: Secondary | ICD-10-CM | POA: Diagnosis not present

## 2015-03-06 DIAGNOSIS — Z7951 Long term (current) use of inhaled steroids: Secondary | ICD-10-CM | POA: Diagnosis not present

## 2015-03-06 DIAGNOSIS — K5900 Constipation, unspecified: Secondary | ICD-10-CM | POA: Insufficient documentation

## 2015-03-06 DIAGNOSIS — R109 Unspecified abdominal pain: Secondary | ICD-10-CM

## 2015-03-06 DIAGNOSIS — Z79899 Other long term (current) drug therapy: Secondary | ICD-10-CM | POA: Diagnosis not present

## 2015-03-06 DIAGNOSIS — R1032 Left lower quadrant pain: Secondary | ICD-10-CM | POA: Diagnosis not present

## 2015-03-06 DIAGNOSIS — E785 Hyperlipidemia, unspecified: Secondary | ICD-10-CM | POA: Diagnosis not present

## 2015-03-06 DIAGNOSIS — Z87442 Personal history of urinary calculi: Secondary | ICD-10-CM | POA: Diagnosis not present

## 2015-03-06 DIAGNOSIS — R110 Nausea: Secondary | ICD-10-CM | POA: Insufficient documentation

## 2015-03-06 DIAGNOSIS — Z9981 Dependence on supplemental oxygen: Secondary | ICD-10-CM | POA: Diagnosis not present

## 2015-03-06 DIAGNOSIS — K219 Gastro-esophageal reflux disease without esophagitis: Secondary | ICD-10-CM | POA: Diagnosis not present

## 2015-03-06 DIAGNOSIS — Z720 Tobacco use: Secondary | ICD-10-CM | POA: Insufficient documentation

## 2015-03-06 DIAGNOSIS — K5730 Diverticulosis of large intestine without perforation or abscess without bleeding: Secondary | ICD-10-CM | POA: Diagnosis not present

## 2015-03-06 DIAGNOSIS — R002 Palpitations: Secondary | ICD-10-CM | POA: Diagnosis not present

## 2015-03-06 DIAGNOSIS — R319 Hematuria, unspecified: Secondary | ICD-10-CM

## 2015-03-06 DIAGNOSIS — I10 Essential (primary) hypertension: Secondary | ICD-10-CM | POA: Insufficient documentation

## 2015-03-06 DIAGNOSIS — Z86718 Personal history of other venous thrombosis and embolism: Secondary | ICD-10-CM | POA: Diagnosis not present

## 2015-03-06 DIAGNOSIS — Z8739 Personal history of other diseases of the musculoskeletal system and connective tissue: Secondary | ICD-10-CM | POA: Insufficient documentation

## 2015-03-06 DIAGNOSIS — Z862 Personal history of diseases of the blood and blood-forming organs and certain disorders involving the immune mechanism: Secondary | ICD-10-CM | POA: Insufficient documentation

## 2015-03-06 DIAGNOSIS — J449 Chronic obstructive pulmonary disease, unspecified: Secondary | ICD-10-CM | POA: Diagnosis not present

## 2015-03-06 DIAGNOSIS — Z973 Presence of spectacles and contact lenses: Secondary | ICD-10-CM | POA: Diagnosis not present

## 2015-03-06 DIAGNOSIS — Z8701 Personal history of pneumonia (recurrent): Secondary | ICD-10-CM | POA: Diagnosis not present

## 2015-03-06 DIAGNOSIS — R1012 Left upper quadrant pain: Secondary | ICD-10-CM | POA: Insufficient documentation

## 2015-03-06 LAB — BASIC METABOLIC PANEL
Anion gap: 11 (ref 5–15)
BUN: 8 mg/dL (ref 6–20)
CO2: 28 mmol/L (ref 22–32)
Calcium: 8.8 mg/dL — ABNORMAL LOW (ref 8.9–10.3)
Chloride: 93 mmol/L — ABNORMAL LOW (ref 101–111)
Creatinine, Ser: 0.61 mg/dL (ref 0.44–1.00)
GFR calc non Af Amer: >60 mL/min (ref >60)
Glucose, Bld: 116 mg/dL — ABNORMAL HIGH (ref 65–99)
Potassium: 3.2 mmol/L — ABNORMAL LOW (ref 3.5–5.1)
Sodium: 132 mmol/L — ABNORMAL LOW (ref 135–145)

## 2015-03-06 LAB — CBC WITH DIFFERENTIAL/PLATELET
BASOS PCT: 1 % (ref 0–1)
Basophils Absolute: 0.1 10*3/uL (ref 0.0–0.1)
EOS PCT: 2 % (ref 0–5)
Eosinophils Absolute: 0.1 10*3/uL (ref 0.0–0.7)
HEMATOCRIT: 39 % (ref 36.0–46.0)
Hemoglobin: 13.1 g/dL (ref 12.0–15.0)
LYMPHS PCT: 27 % (ref 12–46)
Lymphs Abs: 2.3 10*3/uL (ref 0.7–4.0)
MCH: 30 pg (ref 26.0–34.0)
MCHC: 33.6 g/dL (ref 30.0–36.0)
MCV: 89.4 fL (ref 78.0–100.0)
MONO ABS: 0.9 10*3/uL (ref 0.1–1.0)
MONOS PCT: 11 % (ref 3–12)
NEUTROS ABS: 5 10*3/uL (ref 1.7–7.7)
NEUTROS PCT: 59 % (ref 43–77)
PLATELETS: 350 10*3/uL (ref 150–400)
RBC: 4.36 MIL/uL (ref 3.87–5.11)
RDW: 13.7 % (ref 11.5–15.5)
WBC: 8.3 10*3/uL (ref 4.0–10.5)

## 2015-03-06 LAB — URINALYSIS, ROUTINE W REFLEX MICROSCOPIC
Bilirubin Urine: NEGATIVE
GLUCOSE, UA: NEGATIVE mg/dL
KETONES UR: 15 mg/dL — AB
Leukocytes, UA: NEGATIVE
Nitrite: NEGATIVE
Protein, ur: NEGATIVE mg/dL
SPECIFIC GRAVITY, URINE: 1.008 (ref 1.005–1.030)
UROBILINOGEN UA: 0.2 mg/dL (ref 0.0–1.0)
pH: 7 (ref 5.0–8.0)

## 2015-03-06 LAB — URINE MICROSCOPIC-ADD ON

## 2015-03-06 MED ORDER — SODIUM CHLORIDE 0.9 % IV SOLN
8.0000 mg | Freq: Once | INTRAVENOUS | Status: AC
  Filled 2015-03-06: qty 4

## 2015-03-06 MED ORDER — ONDANSETRON 8 MG PO TBDP: 8.0000 mg | ORAL_TABLET | Freq: Once | ORAL | Status: DC

## 2015-03-06 MED ORDER — ONDANSETRON HCL 4 MG/2ML IJ SOLN
INTRAMUSCULAR | Status: AC
  Administered 2015-03-06: 8 mg
  Filled 2015-03-06: qty 4

## 2015-03-06 MED ORDER — ONDANSETRON HCL 4 MG/2ML IJ SOLN: 4.0000 mg | Freq: Once | INTRAMUSCULAR | Status: DC

## 2015-03-06 NOTE — ED Provider Notes (Signed)
CSN: [id number]     Arrival date & time 03/06/15  2022 History   None    Chief Complaint  Patient presents with  . Abdominal Pain  . Palpitations   HPI  Tamara Adams is a 74yo female presenting with abdominal pain and palpitations. Reports LLQ abdominal pain that radiates to flank and nausea. Pain started on Friday. States that pain feels similar to previous bowel obstructions, but not as severe. History of kidney stones, but states that pain is usually more in her back. Reports history of palpitations, stating this has been worked up by cardiology. Also notes nausea, but denies vomiting. Reports history of constipation, stating that a small bowel movement today was the first bowel movement Tamara Adams's had in three days. Usually takes Miralax and Linzess for constipation, but states Tamara Adams has been out of the [place]. Denies fever or shortness of breath.   Past Medical History  Diagnosis Date  . COPD (chronic obstructive pulmonary disease)     FVC 72%, FEV1 29%, FEV1 ratio 32% (very severe (COPD)  . Hypertension   . Pneumonia   . DVT (deep venous thrombosis)     "LLE; years ago after a surgery"  . Pinched nerve     in back  . Peptic ulcer   . Tubular adenoma of colon 09/2011  . H/O hiatal hernia   . Anemia     as a child  . Diverticulosis   . Hyperlipidemia   . Shortness of breath dyspnea     with exertion  . Fluttering sensation of heart     pt put on metoprolol as a result  . GERD (gastroesophageal reflux disease)     PMH  . Liver lesion   . Wears glasses   . Complication of anesthesia     " My blood gas dropped during surgery so I was left on a ventilator and in ICU for 3 days."  . PONV (postoperative nausea and vomiting)   . Skipped heart beats   . On home oxygen therapy     "3L; sleep w/it; use it when I rest" (12/24/2014)  . Small bowel obstruction     "several times; OR twice" (12/24/2014)  . Headache     "maybe weekly" (12/24/2014)  . Degenerative disc disease   . Arthritis      "hands" (12/24/2014)  . Kidney stone    Past Surgical History  Procedure Laterality Date  . Abdominal hysterectomy  1987    and one ovary  . Oophorectomy  1992  . Cataract extraction w/phaco Left 06/12/2012    Dr. Boykin Reaper  . Cataract extraction w/ intraocular lens  implant, bilateral Right 06/22/2012    Dr. Lester Kinsman.   Marland Kitchen Urethral dilation    . Small intestine surgery      for a blockage, no bowel was removed, just kinked from scar tissue  . Cholecystectomy N/A 08/18/2013    Procedure: LAPAROSCOPIC CHOLECYSTECTOMY;  Surgeon: Harl Bowie, MD;  Location: [facility];  Service: General;  Laterality: N/A;  . Dilation and curettage of uterus    . Tubal ligation    . Multiple tooth extractions    . Hernia repair    . Exploratory laparotomy  12/24/2014  . Abdominal adhesion surgery  12/24/2014  . Incisional hernia repair  12/24/2014  . Laparotomy N/A 12/24/2014    Procedure: EXPLORATORY LAPAROTOMY;  Surgeon: Coralie Keens, MD;  Location: [place];  Service: General;  Laterality: N/A;  . Lysis of adhesion N/A 12/24/2014  Procedure: LYSIS OF ADHESION;  Surgeon: Coralie Keens, MD;  Location: [facility];  Service: General;  Laterality: N/A;  . Incisional hernia repair N/A 12/24/2014    Procedure: HERNIA REPAIR INCISIONAL;  Surgeon: Coralie Keens, MD;  Location: [facility] OR;  Service: General;  Laterality: N/A;   Family History  Problem Relation Age of Onset  . Hypertension Mother   . Ovarian cancer Mother   . Glaucoma Mother   . Colon cancer Neg Hx   . Colon polyps Neg Hx   . Diabetes Neg Hx   . Kidney disease Neg Hx   . Gallbladder disease Neg Hx   . Esophageal cancer Neg Hx   . Glaucoma Sister    Social History  Substance Use Topics  . Smoking status: Current Some Day Smoker -- 0.25 packs/day for 57 years    Types: Cigarettes  . Smokeless tobacco: Never Used  . Alcohol Use: No   OB History    No data available     Review of Systems  Constitutional: Negative for fever.   Respiratory: Negative for shortness of breath.   Cardiovascular: Positive for palpitations. Negative for chest pain.  Gastrointestinal: Positive for nausea, abdominal pain and constipation. Negative for vomiting and diarrhea.  Genitourinary: Negative for dysuria, urgency and frequency.      Allergies  Maxidex; Advair diskus; Trazodone and nefazodone; and Tylox  Home Medications   Prior to Admission medications   Medication Sig Start Date End Date Taking? Authorizing Provider  acetaminophen (TYLENOL) 500 MG tablet Take 500 mg by mouth every 6 (six) hours as needed for mild pain.    Historical Provider, MD  albuterol (PROVENTIL) (2.5 MG/3ML) 0.083% nebulizer solution USE 1 VIAL (3 ML) VIA NEBULIZER EVERY 6 HOURS AS NEEDED FOR WHEEZING OR SHORTNESS OF BREATH 02/24/15   [name], MD  amLODipine-benazepril (LOTREL) 5-40 MG per capsule TAKE 1 CAPSULE DAILY 02/24/15   [name], MD  doxycycline (VIBRA-TABS) 100 MG tablet Take 1 tablet (100 mg total) by mouth 2 (two) times daily. 02/22/15   [name], MD  ENSURE PLUS (ENSURE PLUS) LIQD Take 2 Cans by mouth daily. For weight gain    Historical Provider, MD  Fluticasone Furoate-Vilanterol 100-25 MCG/INH AEPB Inhale 1 puff into the lungs daily. 10/14/14   [name], MD  Linaclotide Rolan Lipa) 290 MCG CAPS capsule Take 1 capsule (290 mcg total) by mouth daily. 10/08/14   Ladene Artist, MD  LORazepam (ATIVAN) 1 MG tablet Take 1 tablet (1 mg total) by mouth daily as needed for anxiety or sleep. 01/25/15   [name], MD  Multiple Vitamin (MULTIVITAMIN WITH MINERALS) TABS tablet Take 1 tablet by mouth daily. Pt uses One-A-Day brand    Historical Provider, MD  omeprazole (PRILOSEC) 40 MG capsule Take 40 mg by mouth daily.    Historical Provider, MD  omeprazole (PRILOSEC) 40 MG capsule TAKE 1 CAPSULE DAILY FOR GASTROINTESTINAL ULCER 02/24/15   [name], MD  ondansetron (ZOFRAN-ODT) 4 MG  disintegrating tablet Take 1 tablet (4 mg total) by mouth every 8 (eight) hours as needed for nausea. 01/25/15   [name], MD  pravastatin (PRAVACHOL) 40 MG tablet Take 1 tablet (40 mg total) by mouth at bedtime. 10/05/14   [name], MD  predniSONE (DELTASONE) 20 MG tablet Take 2 tablets (40 mg total) by mouth daily. 02/22/15   [name], MD  PROAIR HFA 108 [phone] BASE) MCG/ACT inhaler INHALE 2 TO 4 PUFFS  INTO THE LUNGS EVERY 4 TO 6 HOURS AS NEEDED 02/24/15   [name], MD  SPIRIVA HANDIHALER 18 MCG inhalation capsule INHALE THE CONTENTS OF 1 CAPSULE WITH 2 INHALATIONS ONLY ONCE DAILY 10/26/14   [name], MD  TOPROL XL 25 MG 24 hr tablet TAKE 1 TABLET DAILY 02/24/15   [name], MD   BP 164/77 mmHg  Pulse 90  Temp(Src) 98.4 F (36.9 C) (Oral)  Resp 16  Ht '5\' 4"'$  (1.626 m)  Wt 84 lb (38.102 kg)  BMI 14.41 kg/m2  SpO2 95% Physical Exam  Constitutional: Tamara Adams is oriented to person, place, and time. Tamara Adams appears well-developed and well-nourished. No distress.  HENT:  Dry mucous membranes  Cardiovascular: Normal rate.  Exam reveals no gallop and no friction rub.   No murmur heard. Pulmonary/Chest: Effort normal. No respiratory distress. Tamara Adams has no wheezes. Tamara Adams has no rales.  Abdominal: Soft. Tamara Adams exhibits no distension.  Tenderness in LUQ and around left flank, No CVA tenderness noted, negative murphy's, negative rebound, negative rovsing's  Musculoskeletal: Tamara Adams exhibits no edema.  Neurological: Tamara Adams is alert and oriented to person, place, and time.  Skin: Skin is warm and dry. No rash noted.    ED Course  Procedures (including critical care time) Labs Review Labs Reviewed - No data to display  Imaging Review No results found. I, [facility], personally reviewed and evaluated these images and lab results as part of my medical decision-making.   Date: 03/06/2015  Rate: 90   Rhythm: normal sinus rhythm  QRS Axis: normal   Intervals: normal  ST/T Wave abnormalities: normal  Conduction Disutrbances: none  Narrative Interpretation: Nonspecific ST abnormality. Abnormal EKG. Reviewed and discussed with Dr. Audie Pinto.    MDM   Final diagnoses:  None  Urinalysis with large hemoglobin. BMP with hyponatremia at 132, hypokalemia at 3.2, hypochloremia at 93, and hyperglycemia at 116. CBC normal. KUB with significant stool but no signs of obstruction or acute abnormality. Given large hemoglobin, will obtain CT renal stone study. CT renal stone study normal. Stable for discharge with follow up with PCP. Recommended follow up with Urology to evaluate Hematuria, but Tamara Adams states this was already worked up two years ago. Will have PCP follow up with repeat urinalysis. Recommend filling prescription for Linzess and increasing Miralax to 2 caps BID. If abdominal pain worsens acutely, return to ED for re-evaluation.    [place], [place] 03/07/15 0023  Leonard Schwartz, MD 03/07/15 [phone]

## 2015-03-06 NOTE — ED Notes (Signed)
Patient gave urine sample, placed at bedside in case needed.

## 2015-03-06 NOTE — ED Notes (Signed)
Patient transported to X-ray 

## 2015-03-06 NOTE — ED Notes (Signed)
Patient states that she is having left lower abdominal pain and nausea - reports that it radiates to her flank

## 2015-03-07 NOTE — Discharge Instructions (Signed)

## 2015-03-08 ENCOUNTER — Encounter: Payer: Self-pay | Admitting: Family Medicine

## 2015-03-08 ENCOUNTER — Ambulatory Visit (INDEPENDENT_AMBULATORY_CARE_PROVIDER_SITE_OTHER): Payer: Medicare Other | Admitting: Family Medicine

## 2015-03-08 VITALS — BP 131/67 | HR 94 | Ht 64.0 in | Wt 87.0 lb

## 2015-03-08 DIAGNOSIS — Z0000 Encounter for general adult medical examination without abnormal findings: Secondary | ICD-10-CM | POA: Diagnosis not present

## 2015-03-08 DIAGNOSIS — E785 Hyperlipidemia, unspecified: Secondary | ICD-10-CM | POA: Diagnosis not present

## 2015-03-08 MED ORDER — PAROXETINE HCL 10 MG PO TABS: ORAL_TABLET | ORAL | Status: DC

## 2015-03-08 MED ORDER — LINACLOTIDE 290 MCG PO CAPS: 290.0000 ug | ORAL_CAPSULE | Freq: Every day | ORAL | Status: DC

## 2015-03-08 NOTE — Progress Notes (Signed)
Subjective:    Tamara Adams is a 74 y.o. female who presents for Medicare Annual/Subsequent preventive examination.  Preventive Screening-Counseling & Management  Tobacco History  Smoking status  . Current Some Day Smoker -- 0.25 packs/day for 57 years  . Types: Cigarettes  Smokeless tobacco  . Never Used     Problems Prior to Visit 1. Dr. Kennedy Bucker 2. Dr. Anice Paganini   Current Problems (verified) Patient Active Problem List   Diagnosis Date Noted  . Leukocytosis 01/12/2015  . HCAP (healthcare-associated pneumonia) 01/11/2015  . COPD exacerbation 01/11/2015  . Aortic atherosclerosis 10/05/2014  . Abnormal CT of liver 02/05/2014  . Protein-calorie malnutrition, severe 01/29/2014  . Small bowel obstruction 01/28/2014  . Hypoxemia 12/07/2013  . SBO (small bowel obstruction) 08/15/2013  . Lumbar degenerative disc disease 05/28/2013  . Gastric ulcer 04/22/2013  . Other malaise and fatigue 10/09/2012  . CHEST PAIN, PRECORDIAL 10/04/2010  . HYPERLIPIDEMIA 09/14/2010  . TOBACCO ABUSE 09/14/2010  . HYPERTENSION, BENIGN 09/14/2010  . COPD (chronic obstructive pulmonary disease) 09/14/2010  . Insomnia 09/14/2010  . PALPITATIONS 09/14/2010  . CAROTID BRUIT 09/14/2010    Medications Prior to Visit Current Outpatient Prescriptions on File Prior to Visit  Medication Sig Dispense Refill  . acetaminophen (TYLENOL) 500 MG tablet Take 500 mg by mouth every 6 (six) hours as needed for mild pain.    Marland Kitchen albuterol (PROVENTIL) (2.5 MG/3ML) 0.083% nebulizer solution USE 1 VIAL (3 ML) VIA NEBULIZER EVERY 6 HOURS AS NEEDED FOR WHEEZING OR SHORTNESS OF BREATH 810 mL 0  . amLODipine-benazepril (LOTREL) 5-40 MG per capsule TAKE 1 CAPSULE DAILY 90 capsule 0  . ENSURE PLUS (ENSURE PLUS) LIQD Take 2 Cans by mouth daily. For weight gain    . Fluticasone Furoate-Vilanterol 100-25 MCG/INH AEPB Inhale 1 puff into the lungs daily. 3 each 3  . LORazepam (ATIVAN) 1 MG tablet Take 1 tablet (1 mg  total) by mouth daily as needed for anxiety or sleep. 30 tablet 1  . Multiple Vitamin (MULTIVITAMIN WITH MINERALS) TABS tablet Take 1 tablet by mouth daily. Pt uses One-A-Day brand    . omeprazole (PRILOSEC) 40 MG capsule TAKE 1 CAPSULE DAILY FOR GASTROINTESTINAL ULCER 90 capsule 3  . ondansetron (ZOFRAN-ODT) 4 MG disintegrating tablet Take 1 tablet (4 mg total) by mouth every 8 (eight) hours as needed for nausea. 20 tablet 1  . pravastatin (PRAVACHOL) 40 MG tablet Take 1 tablet (40 mg total) by mouth at bedtime. 30 tablet 11  . PROAIR HFA 108 (90 BASE) MCG/ACT inhaler INHALE 2 TO 4 PUFFS INTO THE LUNGS EVERY 4 TO 6 HOURS AS NEEDED 25.5 g 2  . SPIRIVA HANDIHALER 18 MCG inhalation capsule INHALE THE CONTENTS OF 1 CAPSULE WITH 2 INHALATIONS ONLY ONCE DAILY 1 capsule 2  . TOPROL XL 25 MG 24 hr tablet TAKE 1 TABLET DAILY 90 tablet 0   No current facility-administered medications on file prior to visit.    Current Medications (verified) Current Outpatient Prescriptions  Medication Sig Dispense Refill  . acetaminophen (TYLENOL) 500 MG tablet Take 500 mg by mouth every 6 (six) hours as needed for mild pain.    Marland Kitchen albuterol (PROVENTIL) (2.5 MG/3ML) 0.083% nebulizer solution USE 1 VIAL (3 ML) VIA NEBULIZER EVERY 6 HOURS AS NEEDED FOR WHEEZING OR SHORTNESS OF BREATH 810 mL 0  . amLODipine-benazepril (LOTREL) 5-40 MG per capsule TAKE 1 CAPSULE DAILY 90 capsule 0  . ENSURE PLUS (ENSURE PLUS) LIQD Take 2 Cans by mouth daily. For weight  gain    . Fluticasone Furoate-Vilanterol 100-25 MCG/INH AEPB Inhale 1 puff into the lungs daily. 3 each 3  . Linaclotide (LINZESS) 290 MCG CAPS capsule Take 1 capsule (290 mcg total) by mouth daily. 90 capsule 4  . LORazepam (ATIVAN) 1 MG tablet Take 1 tablet (1 mg total) by mouth daily as needed for anxiety or sleep. 30 tablet 1  . Multiple Vitamin (MULTIVITAMIN WITH MINERALS) TABS tablet Take 1 tablet by mouth daily. Pt uses One-A-Day brand    . omeprazole (PRILOSEC) 40 MG  capsule TAKE 1 CAPSULE DAILY FOR GASTROINTESTINAL ULCER 90 capsule 3  . ondansetron (ZOFRAN-ODT) 4 MG disintegrating tablet Take 1 tablet (4 mg total) by mouth every 8 (eight) hours as needed for nausea. 20 tablet 1  . pravastatin (PRAVACHOL) 40 MG tablet Take 1 tablet (40 mg total) by mouth at bedtime. 30 tablet 11  . PROAIR HFA 108 (90 BASE) MCG/ACT inhaler INHALE 2 TO 4 PUFFS INTO THE LUNGS EVERY 4 TO 6 HOURS AS NEEDED 25.5 g 2  . SPIRIVA HANDIHALER 18 MCG inhalation capsule INHALE THE CONTENTS OF 1 CAPSULE WITH 2 INHALATIONS ONLY ONCE DAILY 1 capsule 2  . TOPROL XL 25 MG 24 hr tablet TAKE 1 TABLET DAILY 90 tablet 0   No current facility-administered medications for this visit.     Allergies (verified) Maxidex; Advair diskus; Trazodone and nefazodone; and Tylox   PAST HISTORY  Family History Family History  Problem Relation Age of Onset  . Hypertension Mother   . Ovarian cancer Mother   . Glaucoma Mother   . Colon cancer Neg Hx   . Colon polyps Neg Hx   . Diabetes Neg Hx   . Kidney disease Neg Hx   . Gallbladder disease Neg Hx   . Esophageal cancer Neg Hx   . Glaucoma Sister     Social History Social History  Substance Use Topics  . Smoking status: Current Some Day Smoker -- 0.25 packs/day for 57 years    Types: Cigarettes  . Smokeless tobacco: Never Used  . Alcohol Use: No     Are there smokers in your home (other than you)? No  Risk Factors Current exercise habits: The patient does not participate in regular exercise at present.  Dietary issues discussed: can't gaine weight.    Cardiac risk factors: advanced age (older than 26 for men, 60 for women) and sedentary lifestyle.  Depression Screen (Note: if answer to either of the following is "Yes", a more complete depression screening is indicated)   Over the past two weeks, have you felt down, depressed or hopeless? No  Over the past two weeks, have you felt little interest or pleasure in doing things? No  Have  you lost interest or pleasure in daily life? No  Do you often feel hopeless? No  Do you cry easily over simple problems? No  Activities of Daily Living In your present state of health, do you have any difficulty performing the following activities?:  Driving? No Managing money?  No Feeding yourself? No Getting from bed to chair? No  Climbing a flight of stairs? No Preparing food and eating?: No Bathing or showering? No Getting dressed: No Getting to the toilet? No Using the toilet:No Moving around from place to place: No In the past year have you fallen or had a near fall?:No   Are you sexually active?  No  Do you have more than one partner?  No  Hearing Difficulties: Yes Do you  often ask people to speak up or repeat themselves? Yes Do you experience ringing or noises in your ears? No Do you have difficulty understanding soft or whispered voices? Yes   Do you feel that you have a problem with memory? No  Do you often misplace items? No  Do you feel safe at home?  Yes  Cognitive Testing  Alert? Yes  Normal Appearance?Yes  Oriented to person? Yes  Place? Yes   Time? Yes  Recall of three objects?  Yes  Can perform simple calculations? Yes  Displays appropriate judgment?Yes  Can read the correct time from a watch face?Yes   6 CIT is normal today.    Advanced Directives have been discussed with the patient? No  List the Names of Other Physician/Practitioners you currently use: 1.  Dr. Jodi Mourning - eye care   Indicate any recent Medical Services you may have received from other than [facility] providers in the past year (date may be approximate).  Immunization History  Administered Date(s) Administered  . Influenza,inj,Quad PF,36+ Mos 06/01/2013  . Influenza-Unspecified 05/25/2014  . Pneumococcal Conjugate-13 10/01/2014  . Tdap 09/25/2012    Screening Tests Health Maintenance  Topic Date Due  . MAMMOGRAM  07/07/1991  . INFLUENZA VACCINE  02/21/2015  . PNA vac Low Risk  Adult (2 of 2 - PPSV23) 10/01/2015  . COLONOSCOPY  10/09/2016  . TETANUS/TDAP  09/26/2022  . DEXA SCAN  Completed  . ZOSTAVAX  Completed    All answers were reviewed with the patient and necessary referrals were made:  Louine Tenpenny, MD   03/08/2015   History reviewed: allergies, current medications, past family history, past medical history, past social history, past surgical history and problem list  Review of Systems A comprehensive review of systems was negative.    Objective:     Vision by Snellen chart: Seen at [facility] will call for report.    Body mass index is 14.93 kg/(m^2). BP 131/67 mmHg  Pulse 94  Ht '5\' 4"'$  (1.626 m)  Wt 87 lb (39.463 kg)  BMI 14.93 kg/m2  SpO2 95%  BP 131/67 mmHg  Pulse 94  Ht '5\' 4"'$  (1.626 m)  Wt 87 lb (39.463 kg)  BMI 14.93 kg/m2  SpO2 95% General appearance: alert, cooperative and appears stated age, she is wearing oxygen.  Head: Normocephalic, without obvious abnormality, atraumatic Eyes: conj clear, EOMI.  cataracts in place Ears: normal TM's and external ear canals both ears Nose: Nares normal. Septum midline. Mucosa normal. No drainage or sinus tenderness. Throat: lips, mucosa, and tongue normal; teeth and gums normal Neck: no adenopathy, no carotid bruit, no JVD, supple, symmetrical, trachea midline and thyroid not enlarged, symmetric, no tenderness/mass/nodules Back: symmetric, no curvature. ROM normal. No CVA tenderness. Lungs: clear to auscultation bilaterally Breasts: Inspection negative Heart: regular rate and rhythm, S1, S2 normal, no murmur, click, rub or gallop Abdomen: Soft, general tenderness. Near the proximal edge of her abdominal incision I can feel a suture underneath the skin. She says it occasionally creates a poking sensation but otherwise isn't bothersome. Incision is well-healed. Pelvic: deferred  MSK: no lower extremity edema. Gross motor intact.       Assessment:     Annual Medicare Wellness Exam        Plan:     During the course of the visit the patient was educated and counseled about appropriate screening and preventive services including:    Influenza vaccine  Screening mammography - declines today.   Underweight/BMI 14 -  discussed several options. We could consider putting her on paroxetine which could help with mood as well as increased appetite or even consider Megace. We'll start with the paroxetine first. Like to see her back in one month just make sure that she's tolerating it well without any side effects or problems. We'll start with 10 mg and try increasing to 20 g after 2 weeks if tolerated well.  Hyperlipidemia- due to recheck lipids.    Went to the ED on Sunay night and says she was told was constipated.  She has been taking 2 Falls of MiraLAX daily as well as her Linzess and a stool softener. She has been moving her bowels little bit more normally and says her pain has improved but she still having some abdominal discomfort.   Diet review for nutrition referral? Yes ____  Not Indicated _X__   Patient Instructions (the written plan) was given to the patient.  Medicare Attestation I have personally reviewed: The patient's medical and social history Their use of alcohol, tobacco or illicit drugs Their current medications and supplements The patient's functional ability including ADLs,fall risks, home safety risks, cognitive, and hearing and visual impairment Diet and physical activities Evidence for depression or mood disorders  The patient's weight, height, BMI, and visual acuity have been recorded in the chart.  I have made referrals, counseling, and provided education to the patient based on review of the above and I have provided the patient with a written personalized care plan for preventive services.     Britiny Defrain, MD   03/08/2015

## 2015-03-08 NOTE — Patient Instructions (Signed)
Keep up a regular exercise program and make sure you are eating a healthy diet Try to eat 4 servings of dairy a day, or if you are lactose intolerant take a calcium with vitamin D daily.  Your vaccines are up to date.   

## 2015-03-30 ENCOUNTER — Telehealth: Payer: Self-pay | Admitting: *Deleted

## 2015-03-30 NOTE — Telephone Encounter (Signed)
I thinks she is having a COPD exacerbation and needs appt for tomorrow. I am no tsure about the endocrine referral? Not sure what it is for?

## 2015-03-30 NOTE — Telephone Encounter (Signed)
Called and informed pt of recommendations. appt made for tomorrow.Tamara Adams

## 2015-03-30 NOTE — Telephone Encounter (Signed)
Pt called and stated that she would like to move forward with Endocrinology referral. Also she stated that she has been experiencing a lot of breathing issues over the past 4-5 days without the O2 it drops down between 86-89. I asked if she has been wearing the O2 more and she is using 3-4L depending on how bad her breathing is. I asked if she has been using the neb and she stated that she has and it helps for a short period of time around 15 mins before she has to put the O2 back on. She also reports some chest tightness. She has an appt on 9/16. She wanted to know if Dr. Madilyn Fireman had any suggestions as to what she should do to help with this. Fwd to pcp for advice.Audelia Hives Honor

## 2015-03-31 ENCOUNTER — Ambulatory Visit (INDEPENDENT_AMBULATORY_CARE_PROVIDER_SITE_OTHER): Payer: Medicare Other | Admitting: Family Medicine

## 2015-03-31 ENCOUNTER — Encounter: Payer: Self-pay | Admitting: Family Medicine

## 2015-03-31 VITALS — BP 170/67 | HR 93 | Temp 98.0°F | Wt 87.3 lb

## 2015-03-31 DIAGNOSIS — R0602 Shortness of breath: Secondary | ICD-10-CM

## 2015-03-31 DIAGNOSIS — J441 Chronic obstructive pulmonary disease with (acute) exacerbation: Secondary | ICD-10-CM

## 2015-03-31 MED ORDER — PREDNISONE 20 MG PO TABS: ORAL_TABLET | ORAL | Status: DC

## 2015-03-31 MED ORDER — LEVOFLOXACIN 500 MG PO TABS: 500.0000 mg | ORAL_TABLET | Freq: Every day | ORAL | Status: DC

## 2015-03-31 NOTE — Progress Notes (Signed)
   Subjective:    Patient ID: Tamara Adams, female    DOB: 02/28/1941, 74 y.o.   MRN: [id number]  HPI   74 year old female with a history of COPD on chronic oxygen therapy, stable at 3 L. She is coming in today with increased shortness of breath and fatigue.   Toilet leaked aobut 3-4 months ago and they thought they dried everything but just found out there was mold in the carpet.  She has been more SOB. Has been turning her oxygen up to 4L at time. Sleeps with her oxygen  On.  She says she normally can go to the bathroom and make her coffee before putting her oxygen on it.  Now her oxygen is dropping to 85-86 and has to put her oxygen back on.  Feels like her sinuses have had more pressure.  No fever, chillls, Or sweats .  Still a productive cough but better than it was 3 weeks ago. Sputum is yellow/white.   Has been using her albuterol TID. Her husband he was also on the home has been coughing more and her granddaughter has had sinus problem some headaches more recently.    Review of Systems     Objective:   Physical Exam  Constitutional: She is oriented to person, place, and time. She appears well-developed and well-nourished.  HENT:  Head: Normocephalic and atraumatic.  Right Ear: External ear normal.  Left Ear: External ear normal.  Nose: Nose normal.  Mouth/Throat: Oropharynx is clear and moist.  Left TM is blocked by cerumen. Right TM and canal is clear.  Eyes: Conjunctivae and EOM are normal. Pupils are equal, round, and reactive to light.  Neck: Neck supple. No thyromegaly present.  Cardiovascular: Normal rate, regular rhythm and normal heart sounds.   Pulmonary/Chest: Effort normal. She has wheezes.  Diffuse expiratory wheezing  Lymphadenopathy:    She has no cervical adenopathy.  Neurological: She is alert and oriented to person, place, and time.  Skin: Skin is warm and dry.  Psychiatric: She has a normal mood and affect.          Assessment & Plan:  COPD  exacerbation possibly from mold exposure.  Will treat with prednisone.  Decided to give another round of antibiotic since still experiencing productive cough with yellow sputum ( though overall cough is somewhat better). If not improving over the weekend then will get CXR. Increase nebs to every 3 hours over next couple of days until feeling some better.  Also discussed with her that it's okay to keep her oxygen concentration stable as long as her pulse ox is above 92%. She doesn't have to increase it until she sees 98 or 99%. I'm also going to refer her to pulmonology at this point. I would try to set her up with Dr. Elsworth Soho at her [facility] location.%

## 2015-04-08 ENCOUNTER — Ambulatory Visit (INDEPENDENT_AMBULATORY_CARE_PROVIDER_SITE_OTHER): Payer: Medicare Other | Admitting: Family Medicine

## 2015-04-08 ENCOUNTER — Encounter: Payer: Self-pay | Admitting: Family Medicine

## 2015-04-08 VITALS — BP 146/78 | HR 108 | Temp 98.6°F | Ht 64.0 in | Wt 87.0 lb

## 2015-04-08 DIAGNOSIS — J441 Chronic obstructive pulmonary disease with (acute) exacerbation: Secondary | ICD-10-CM | POA: Diagnosis not present

## 2015-04-08 MED ORDER — UMECLIDINIUM BROMIDE 62.5 MCG/INH IN AEPB: 1.0000 | INHALATION_SPRAY | Freq: Every day | RESPIRATORY_TRACT | Status: DC

## 2015-04-08 MED ORDER — PREDNISONE 20 MG PO TABS: ORAL_TABLET | ORAL | Status: DC

## 2015-04-08 NOTE — Progress Notes (Signed)
   Subjective:    Patient ID: Tamara Adams, female    DOB: [birth year], 74 y.o.   MRN: [id number]  HPI F/u COPD exacerbation Using her flutter valve and down to '10mg'$  of prednisone. She says she's starting to feel a little worse. No change in cough or sputum but just feeling a little more short of breath since she went down on the prednisone from 20 mg to 10 mg. She finished the levaquin too. Had pneumonia in June. No fevers chills or sweats. Very little sputum production. She's on 4 L of oxygen.   Review of Systems     Objective:   Physical Exam  Constitutional: She is oriented to person, place, and time. She appears well-developed and well-nourished.  HENT:  Head: Normocephalic and atraumatic.  Cardiovascular: Normal rate, regular rhythm and normal heart sounds.   Pulmonary/Chest: Effort normal. She has wheezes.  Diffuse mild expiratory wheezing.  Neurological: She is alert and oriented to person, place, and time.  Skin: Skin is warm and dry.  Psychiatric: She has a normal mood and affect. Her behavior is normal.          Assessment & Plan:  COPD - will stop the Spiriva and switch to increase. She really likes the device since she is already using Breo. She can use both. Continue use on her on nebulizer treatments as needed. Will increase prednisone back to 20 mg for one week and then taper back down to 10 etc. We did try to get her in with pulmonology for consult. Unfortunately there but doubt until December so we added her to a cancellation list. Hopefully we can get her in a little sooner.

## 2015-04-25 ENCOUNTER — Ambulatory Visit (INDEPENDENT_AMBULATORY_CARE_PROVIDER_SITE_OTHER): Payer: Medicare Other | Admitting: Family Medicine

## 2015-04-25 ENCOUNTER — Encounter: Payer: Self-pay | Admitting: Family Medicine

## 2015-04-25 VITALS — BP 130/62 | HR 100 | Wt 86.0 lb

## 2015-04-25 DIAGNOSIS — R634 Abnormal weight loss: Secondary | ICD-10-CM | POA: Diagnosis not present

## 2015-04-25 DIAGNOSIS — I10 Essential (primary) hypertension: Secondary | ICD-10-CM | POA: Diagnosis not present

## 2015-04-25 DIAGNOSIS — J441 Chronic obstructive pulmonary disease with (acute) exacerbation: Secondary | ICD-10-CM | POA: Diagnosis not present

## 2015-04-25 DIAGNOSIS — Z23 Encounter for immunization: Secondary | ICD-10-CM | POA: Diagnosis not present

## 2015-04-25 DIAGNOSIS — R790 Abnormal level of blood mineral: Secondary | ICD-10-CM

## 2015-04-25 MED ORDER — LORAZEPAM 1 MG PO TABS: 1.0000 mg | ORAL_TABLET | Freq: Every day | ORAL | Status: DC | PRN

## 2015-04-25 MED ORDER — UMECLIDINIUM BROMIDE 62.5 MCG/INH IN AEPB: 1.0000 | INHALATION_SPRAY | Freq: Every day | RESPIRATORY_TRACT | Status: DC

## 2015-04-25 NOTE — Progress Notes (Signed)
Subjective:    Patient ID: Tamara Adams, female    DOB: March 02, 1941, 74 y.o.   MRN: [id number]  HPI COPD doing well on incruse. Likes  Incruse better than the Spiriva.  appt w/Dr. Elsworth Soho is [id number]. She is also on Breo. Completed the prednisone yesterday.  Still not back to her routine. Says her carpet is out and they are putting new flooring out.   Hypertension- Pt denies chest pain, SOB, dizziness, or heart palpitations.  Taking meds as directed w/o problems.  Denies medication side effects.    She is still concerned about her struggle to maintain her weight. She feels like she tries to eat a fair amount but just can't seem to keep up.  Review of Systems  BP 161/66 mmHg  Pulse 100  Wt 86 lb (39.009 kg)  SpO2 100%    Allergies  Allergen Reactions  . Maxidex [Dexamethasone] Other (See Comments)    dehydration  . Advair Diskus [Fluticasone-Salmeterol]     No benefit with lungs  . Trazodone And Nefazodone Other (See Comments)    Heart pounding  . Tylox [Oxycodone-Acetaminophen] Itching    Past Medical History  Diagnosis Date  . COPD (chronic obstructive pulmonary disease) (HCC)     FVC 72%, FEV1 29%, FEV1 ratio 32% (very severe (COPD)  . Hypertension   . Pneumonia   . DVT (deep venous thrombosis) ([place])     "LLE; years ago after a surgery"  . Pinched nerve     in back  . Peptic ulcer   . Tubular adenoma of colon 09/2011  . H/O hiatal hernia   . Anemia     as a child  . Diverticulosis   . Hyperlipidemia   . Shortness of breath dyspnea     with exertion  . Fluttering sensation of heart     pt put on metoprolol as a result  . GERD (gastroesophageal reflux disease)     PMH  . Liver lesion   . Wears glasses   . Complication of anesthesia     " My blood gas dropped during surgery so I was left on a ventilator and in ICU for 3 days."  . PONV (postoperative nausea and vomiting)   . Skipped heart beats   . On home oxygen therapy     "3L; sleep w/it; use it when I rest"  (12/24/2014)  . Small bowel obstruction ([place])     "several times; OR twice" (12/24/2014)  . Headache     "maybe weekly" (12/24/2014)  . Degenerative disc disease   . Arthritis     "hands" (12/24/2014)  . Kidney stone     Past Surgical History  Procedure Laterality Date  . Abdominal hysterectomy  1987    and one ovary  . Oophorectomy  1992  . Cataract extraction w/phaco Left 06/12/2012    Dr. Boykin Reaper  . Cataract extraction w/ intraocular lens  implant, bilateral Right 06/22/2012    Dr. Lester Kinsman.   Marland Kitchen Urethral dilation    . Small intestine surgery      for a blockage, no bowel was removed, just kinked from scar tissue  . Cholecystectomy N/A 08/18/2013    Procedure: LAPAROSCOPIC CHOLECYSTECTOMY;  Surgeon: Harl Bowie, MD;  Location: [place];  Service: General;  Laterality: N/A;  . Dilation and curettage of uterus    . Tubal ligation    . Multiple tooth extractions    . Hernia repair    . Exploratory laparotomy  12/24/2014  .  Abdominal adhesion surgery  12/24/2014  . Incisional hernia repair  12/24/2014  . Laparotomy N/A 12/24/2014    Procedure: EXPLORATORY LAPAROTOMY;  Surgeon: Coralie Keens, MD;  Location: [place];  Service: General;  Laterality: N/A;  . Lysis of adhesion N/A 12/24/2014    Procedure: LYSIS OF ADHESION;  Surgeon: Coralie Keens, MD;  Location: [place];  Service: General;  Laterality: N/A;  . Incisional hernia repair N/A 12/24/2014    Procedure: HERNIA REPAIR INCISIONAL;  Surgeon: Coralie Keens, MD;  Location: [place];  Service: General;  Laterality: N/A;    Social History   Social History  . Marital Status: Married    Spouse Name: N/A  . Number of Children: 3  . Years of Education: N/A   Occupational History  . retired.      Social History Main Topics  . Smoking status: Current Some Day Smoker -- 0.25 packs/day for 57 years    Types: Cigarettes  . Smokeless tobacco: Never Used  . Alcohol Use: No  . Drug Use: No  . Sexual Activity: No   Other Topics  Concern  . Not on file   Social History Narrative   No regular exercise.     Family History  Problem Relation Age of Onset  . Hypertension Mother   . Ovarian cancer Mother   . Glaucoma Mother   . Colon cancer Neg Hx   . Colon polyps Neg Hx   . Diabetes Neg Hx   . Kidney disease Neg Hx   . Gallbladder disease Neg Hx   . Esophageal cancer Neg Hx   . Glaucoma Sister     Outpatient Encounter Prescriptions as of 04/25/2015  Medication Sig  . acetaminophen (TYLENOL) 500 MG tablet Take 500 mg by mouth every 6 (six) hours as needed for mild pain.  Marland Kitchen albuterol (PROVENTIL) (2.5 MG/3ML) 0.083% nebulizer solution USE 1 VIAL (3 ML) VIA NEBULIZER EVERY 6 HOURS AS NEEDED FOR WHEEZING OR SHORTNESS OF BREATH  . amLODipine-benazepril (LOTREL) 5-40 MG per capsule TAKE 1 CAPSULE DAILY  . ENSURE PLUS (ENSURE PLUS) LIQD Take 2 Cans by mouth daily. For weight gain  . Fluticasone Furoate-Vilanterol 100-25 MCG/INH AEPB Inhale 1 puff into the lungs daily.  . Linaclotide (LINZESS) 290 MCG CAPS capsule Take 1 capsule (290 mcg total) by mouth daily.  Marland Kitchen LORazepam (ATIVAN) 1 MG tablet Take 1 tablet (1 mg total) by mouth daily as needed for anxiety or sleep.  . Multiple Vitamin (MULTIVITAMIN WITH MINERALS) TABS tablet Take 1 tablet by mouth daily. Pt uses One-A-Day brand  . omeprazole (PRILOSEC) 40 MG capsule TAKE 1 CAPSULE DAILY FOR GASTROINTESTINAL ULCER  . ondansetron (ZOFRAN-ODT) 4 MG disintegrating tablet Take 1 tablet (4 mg total) by mouth every 8 (eight) hours as needed for nausea.  Marland Kitchen PARoxetine (PAXIL) 10 MG tablet One po QD x 2 weeks then incrase to 2 tabs QD  . pravastatin (PRAVACHOL) 40 MG tablet Take 1 tablet (40 mg total) by mouth at bedtime.  Marland Kitchen PROAIR HFA 108 (90 BASE) MCG/ACT inhaler INHALE 2 TO 4 PUFFS INTO THE LUNGS EVERY 4 TO 6 HOURS AS NEEDED  . TOPROL XL 25 MG 24 hr tablet TAKE 1 TABLET DAILY  . Umeclidinium Bromide (INCRUSE ELLIPTA) 62.5 MCG/INH AEPB Inhale 1 Inhaler into the lungs daily.   . [DISCONTINUED] LORazepam (ATIVAN) 1 MG tablet Take 1 tablet (1 mg total) by mouth daily as needed for anxiety or sleep.  . [DISCONTINUED] Umeclidinium Bromide (INCRUSE ELLIPTA) 62.5 MCG/INH AEPB  Inhale 1 Inhaler into the lungs daily.  . [DISCONTINUED] predniSONE (DELTASONE) 20 MG tablet Take 1 a day x 7 days, then 1/2 tab po QD x7 day.   No facility-administered encounter medications on file as of 04/25/2015.          Objective:   Physical Exam  Constitutional: She is oriented to person, place, and time. She appears well-developed and well-nourished.  HENT:  Head: Normocephalic and atraumatic.  Cardiovascular: Normal rate, regular rhythm and normal heart sounds.   Pulmonary/Chest: Effort normal and breath sounds normal.  Mild inspiratory wheezine bilaterally   Neurological: She is alert and oriented to person, place, and time.  Skin: Skin is warm and dry.  Psychiatric: She has a normal mood and affect. Her behavior is normal.          Assessment & Plan:  COPD exacerbation-she's feeling a little better but still doesn't feel like she is back to baseline as far as her shortness of breath or daily activities. She does like the Incruse better. Appointment I told December with pulmonology for consult. Hopefully getting the flooring taking care of in her home will help her breathing. She says she does feel like she breathes better when she's not at home. Next  Hypertension-well-controlled. Continue current regimen.  Abnormal weight loss-I really think it secondary to her COPD but we will check her TSH today. Also her ferritin was borderline low a year ago so will recheck that as well.  Low ferritin- due to recheck level.

## 2015-05-03 ENCOUNTER — Other Ambulatory Visit: Payer: Self-pay | Admitting: Family Medicine

## 2015-05-26 ENCOUNTER — Other Ambulatory Visit: Payer: Self-pay | Admitting: *Deleted

## 2015-05-26 DIAGNOSIS — R062 Wheezing: Secondary | ICD-10-CM

## 2015-05-26 DIAGNOSIS — J441 Chronic obstructive pulmonary disease with (acute) exacerbation: Secondary | ICD-10-CM

## 2015-05-26 DIAGNOSIS — R0602 Shortness of breath: Secondary | ICD-10-CM

## 2015-05-26 MED ORDER — ONDANSETRON 4 MG PO TBDP: 4.0000 mg | ORAL_TABLET | Freq: Three times a day (TID) | ORAL | Status: DC | PRN

## 2015-06-09 ENCOUNTER — Other Ambulatory Visit: Payer: Self-pay | Admitting: Family Medicine

## 2015-06-15 DIAGNOSIS — E782 Mixed hyperlipidemia: Secondary | ICD-10-CM | POA: Diagnosis not present

## 2015-06-15 DIAGNOSIS — I10 Essential (primary) hypertension: Secondary | ICD-10-CM | POA: Diagnosis not present

## 2015-06-15 DIAGNOSIS — K5904 Chronic idiopathic constipation: Secondary | ICD-10-CM | POA: Diagnosis not present

## 2015-06-15 DIAGNOSIS — R1010 Upper abdominal pain, unspecified: Secondary | ICD-10-CM | POA: Diagnosis not present

## 2015-06-15 DIAGNOSIS — R634 Abnormal weight loss: Secondary | ICD-10-CM | POA: Diagnosis not present

## 2015-06-15 DIAGNOSIS — K210 Gastro-esophageal reflux disease with esophagitis: Secondary | ICD-10-CM | POA: Diagnosis not present

## 2015-06-15 DIAGNOSIS — Z8719 Personal history of other diseases of the digestive system: Secondary | ICD-10-CM | POA: Diagnosis not present

## 2015-06-15 DIAGNOSIS — J441 Chronic obstructive pulmonary disease with (acute) exacerbation: Secondary | ICD-10-CM | POA: Diagnosis not present

## 2015-06-15 DIAGNOSIS — F17209 Nicotine dependence, unspecified, with unspecified nicotine-induced disorders: Secondary | ICD-10-CM | POA: Diagnosis not present

## 2015-06-15 DIAGNOSIS — F3289 Other specified depressive episodes: Secondary | ICD-10-CM | POA: Diagnosis not present

## 2015-06-15 DIAGNOSIS — F5104 Psychophysiologic insomnia: Secondary | ICD-10-CM | POA: Diagnosis not present

## 2015-06-23 ENCOUNTER — Telehealth: Payer: Self-pay | Admitting: Pulmonary Disease

## 2015-06-23 ENCOUNTER — Encounter: Payer: Self-pay | Admitting: Pulmonary Disease

## 2015-06-23 ENCOUNTER — Ambulatory Visit (INDEPENDENT_AMBULATORY_CARE_PROVIDER_SITE_OTHER): Payer: Medicare Other | Admitting: Pulmonary Disease

## 2015-06-23 ENCOUNTER — Ambulatory Visit (HOSPITAL_BASED_OUTPATIENT_CLINIC_OR_DEPARTMENT_OTHER)
Admission: RE | Admit: 2015-06-23 | Discharge: 2015-06-23 | Disposition: A | Discharge status: Home / Self Care | Payer: Medicare Other | Source: Ambulatory Visit | Attending: Pulmonary Disease | Admitting: Pulmonary Disease

## 2015-06-23 VITALS — BP 157/65 | HR 112 | Ht 63.5 in | Wt 85.0 lb

## 2015-06-23 DIAGNOSIS — E43 Unspecified severe protein-calorie malnutrition: Secondary | ICD-10-CM | POA: Diagnosis not present

## 2015-06-23 DIAGNOSIS — J449 Chronic obstructive pulmonary disease, unspecified: Secondary | ICD-10-CM | POA: Diagnosis not present

## 2015-06-23 DIAGNOSIS — F17200 Nicotine dependence, unspecified, uncomplicated: Secondary | ICD-10-CM | POA: Diagnosis not present

## 2015-06-23 DIAGNOSIS — R05 Cough: Secondary | ICD-10-CM | POA: Diagnosis not present

## 2015-06-23 DIAGNOSIS — J441 Chronic obstructive pulmonary disease with (acute) exacerbation: Secondary | ICD-10-CM

## 2015-06-23 DIAGNOSIS — J9611 Chronic respiratory failure with hypoxia: Secondary | ICD-10-CM | POA: Diagnosis not present

## 2015-06-23 MED ORDER — NICOTROL 10 MG IN INHA: 1.0000 | RESPIRATORY_TRACT | Status: DC | PRN

## 2015-06-23 MED ORDER — PREDNISONE 10 MG (21) PO TBPK: 10.0000 mg | ORAL_TABLET | Freq: Every day | ORAL | Status: DC

## 2015-06-23 MED ORDER — PREDNISONE 10 MG (21) PO TBPK: ORAL_TABLET | ORAL | Status: DC

## 2015-06-23 MED ORDER — NYSTATIN 100000 UNIT/ML MT SUSP: 5.0000 mL | Freq: Two times a day (BID) | OROMUCOSAL | Status: DC

## 2015-06-23 NOTE — Assessment & Plan Note (Signed)
You have to QUIT smoking ! Trial of nicotrol inhaler daily x 7 Quit date for 12/15

## 2015-06-23 NOTE — Patient Instructions (Signed)
Prednisone 10 mg  -Take 4 tabs  daily with food x 4 days, then 3 tabs daily x 4 days, then 2 tabs daily x 4 days, then 1 tab daily until next visit  Stay on [place] have thrush - take Nystatin 44m oral swish & swallow twice daily x 10 days  [place] ! Trial of nicotrol inhaler daily x 7 Quit date for 12/15

## 2015-06-23 NOTE — Telephone Encounter (Signed)
Called spoke with [facility] and made aware. Nothing further needed

## 2015-06-23 NOTE — Telephone Encounter (Signed)
Called spoke with Tamara Adams. She reports they received rx for pt nicotrol inhaler and it states 1 puff PRN. It needs specific instructions as insurance is denying RX written this way. Dr. Elsworth Soho, how many times a day is pt suppose to use this? thanks

## 2015-06-23 NOTE — Assessment & Plan Note (Signed)
Ct 3 L o2

## 2015-06-23 NOTE — Telephone Encounter (Signed)
Called and spoke with pharmacist Pharmacist wanted to verify dosage of Nicotrol inhaler  Verified with pharmacist  Nothing further is needed

## 2015-06-23 NOTE — Telephone Encounter (Signed)
1-2 puffs q3h as needed

## 2015-06-23 NOTE — Progress Notes (Signed)
Subjective:    Patient ID: Tamara Adams, female    DOB: Aug 17, 1940, 74 y.o.   MRN: [id number]  HPI PCP - Minerva Ends is a 74 year old smoker who presents to establish care for COPD. She was diagnosed with COPD in 2010 and has been maintained on 3-3.5 L of oxygen since then. She smokes about 5 cigarettes a day-more than 30-pack-years.  Spirometry 09/2010 showed FEV1 of 0.70-29% She has a history of bowel obstruction and chronic malnutrition  She reports increased dyspnea on exertion for the past 2-3 months. She reports chest tightness and cough that was productive of green sputum. She received a course of doxycycline and prednisone for 5 days-this was called in by her family practitioner (she seems to have 2 PCPs) . Prednisone causes some insomnia. She also reports increased stress at home since her husband recently had a CVA, she lives with her granddaughter and her child. She has made attempts to has quit smoking earlier without success nicotine patches have not helped in the past- She is maintained on a regimen of Breo for the last 6 months . Spiriva was changed to Incruse 2 months ago and this seems to be working better-  CT angio chest 12/2014 was negative for pulmonary embolism and showed mild nodular densities in both lung bases with severe emphysema and hyperinflation.   chest x-ray today does not show any infiltrates or effusions  Past Medical History  Diagnosis Date  . COPD (chronic obstructive pulmonary disease) (HCC)     FVC 72%, FEV1 29%, FEV1 ratio 32% (very severe (COPD)  . Hypertension   . Pneumonia   . DVT (deep venous thrombosis) ([place])     "LLE; years ago after a surgery"  . Pinched nerve     in back  . Peptic ulcer   . Tubular adenoma of colon 09/2011  . H/O hiatal hernia   . Anemia     as a child  . Diverticulosis   . Hyperlipidemia   . Shortness of breath dyspnea     with exertion  . Fluttering sensation of heart     pt put on metoprolol as a result  .  GERD (gastroesophageal reflux disease)     PMH  . Liver lesion   . Wears glasses   . Complication of anesthesia     " My blood gas dropped during surgery so I was left on a ventilator and in ICU for 3 days."  . PONV (postoperative nausea and vomiting)   . Skipped heart beats   . On home oxygen therapy     "3L; sleep w/it; use it when I rest" (12/24/2014)  . Small bowel obstruction ([place])     "several times; OR twice" (12/24/2014)  . Headache     "maybe weekly" (12/24/2014)  . Degenerative disc disease   . Arthritis     "hands" (12/24/2014)  . Kidney stone    Past Surgical History  Procedure Laterality Date  . Abdominal hysterectomy  1987    and one ovary  . Oophorectomy  1992  . Cataract extraction w/phaco Left 06/12/2012    Dr. Boykin Reaper  . Cataract extraction w/ intraocular lens  implant, bilateral Right 06/22/2012    Dr. Lester Kinsman.   Marland Kitchen Urethral dilation    . Small intestine surgery      for a blockage, no bowel was removed, just kinked from scar tissue  . Cholecystectomy N/A 08/18/2013    Procedure: LAPAROSCOPIC CHOLECYSTECTOMY;  Surgeon: Marco Collie  Ninfa Linden, MD;  Location: [place];  Service: General;  Laterality: N/A;  . Dilation and curettage of uterus    . Tubal ligation    . Multiple tooth extractions    . Hernia repair    . Exploratory laparotomy  12/24/2014  . Abdominal adhesion surgery  12/24/2014  . Incisional hernia repair  12/24/2014  . Laparotomy N/A 12/24/2014    Procedure: EXPLORATORY LAPAROTOMY;  Surgeon: Coralie Keens, MD;  Location: [place];  Service: General;  Laterality: N/A;  . Lysis of adhesion N/A 12/24/2014    Procedure: LYSIS OF ADHESION;  Surgeon: Coralie Keens, MD;  Location: [place];  Service: General;  Laterality: N/A;  . Incisional hernia repair N/A 12/24/2014    Procedure: HERNIA REPAIR INCISIONAL;  Surgeon: Coralie Keens, MD;  Location: [place];  Service: General;  Laterality: N/A;     Allergies  Allergen Reactions  . Maxidex [Dexamethasone] Other (See  Comments)    dehydration  . Advair Diskus [Fluticasone-Salmeterol]     No benefit with lungs  . Trazodone And Nefazodone Other (See Comments)    Heart pounding  . Tylox [Oxycodone-Acetaminophen] Itching    Social History   Social History  . Marital Status: Married    Spouse Name: N/A  . Number of Children: 3  . Years of Education: N/A   Occupational History  . retired.      Social History Main Topics  . Smoking status: Current Some Day Smoker -- 0.25 packs/day for 57 years    Types: Cigarettes  . Smokeless tobacco: Never Used     Comment: 5 cigarettes a day  . Alcohol Use: No  . Drug Use: No  . Sexual Activity: No   Other Topics Concern  . Not on file   Social History Narrative   No regular exercise.     Family History  Problem Relation Age of Onset  . Hypertension Mother   . Ovarian cancer Mother   . Glaucoma Mother   . Colon cancer Neg Hx   . Colon polyps Neg Hx   . Diabetes Neg Hx   . Kidney disease Neg Hx   . Gallbladder disease Neg Hx   . Esophageal cancer Neg Hx   . Glaucoma Sister      Review of Systems  Constitutional: Negative for fever, chills and unexpected weight change.  HENT: Negative for congestion, dental problem, ear pain, nosebleeds, postnasal drip, rhinorrhea, sinus pressure, sneezing, sore throat, trouble swallowing and voice change.   Eyes: Negative for visual disturbance.  Respiratory: Positive for shortness of breath. Negative for cough and choking.   Cardiovascular: Negative for chest pain and leg swelling.  Gastrointestinal: Negative for vomiting, abdominal pain and diarrhea.  Genitourinary: Negative for difficulty urinating.  Musculoskeletal: Negative for arthralgias.  Skin: Negative for rash.  Neurological: Negative for tremors, syncope and headaches.  Hematological: Does not bruise/bleed easily.       Objective:   Physical Exam  Gen. Pleasant, thin, in no distress, normal affect, on oxygen ENT - no lesions, no post  nasal drip, thrush + Neck: No JVD, no thyromegaly, no carotid bruits Lungs: no use of accessory muscles, no dullness to percussion, decreased bilateral without rales or rhonchi  Cardiovascular: Rhythm regular, heart sounds  normal, no murmurs or gallops, no peripheral edema Abdomen: soft and non-tender, no hepatosplenomegaly, BS normal. Musculoskeletal: No deformities, no cyanosis or clubbing Neuro:  alert, non focal       Assessment & Plan:

## 2015-06-23 NOTE — Assessment & Plan Note (Addendum)
Prednisone 10 mg  -Take 4 tabs  daily with food x 4 days, then 3 tabs daily x 4 days, then 2 tabs daily x 4 days, then 1 tab daily until next visit May need a longer course of prednisone-we'll reassess on next visit  Stay on [place] today  You have thrush - take Nystatin 55m oral swish & swallow twice daily x 10 days

## 2015-06-24 NOTE — Assessment & Plan Note (Signed)
Poor prognostic factor for COPD

## 2015-07-13 DIAGNOSIS — K5904 Chronic idiopathic constipation: Secondary | ICD-10-CM | POA: Diagnosis not present

## 2015-07-13 DIAGNOSIS — R634 Abnormal weight loss: Secondary | ICD-10-CM | POA: Diagnosis not present

## 2015-07-13 DIAGNOSIS — F5104 Psychophysiologic insomnia: Secondary | ICD-10-CM | POA: Diagnosis not present

## 2015-07-13 DIAGNOSIS — J449 Chronic obstructive pulmonary disease, unspecified: Secondary | ICD-10-CM | POA: Diagnosis not present

## 2015-07-13 DIAGNOSIS — E782 Mixed hyperlipidemia: Secondary | ICD-10-CM | POA: Diagnosis not present

## 2015-07-13 DIAGNOSIS — I10 Essential (primary) hypertension: Secondary | ICD-10-CM | POA: Diagnosis not present

## 2015-07-13 DIAGNOSIS — K210 Gastro-esophageal reflux disease with esophagitis: Secondary | ICD-10-CM | POA: Diagnosis not present

## 2015-07-13 DIAGNOSIS — R739 Hyperglycemia, unspecified: Secondary | ICD-10-CM | POA: Diagnosis not present

## 2015-07-13 DIAGNOSIS — F17209 Nicotine dependence, unspecified, with unspecified nicotine-induced disorders: Secondary | ICD-10-CM | POA: Diagnosis not present

## 2015-07-14 DIAGNOSIS — R110 Nausea: Secondary | ICD-10-CM | POA: Diagnosis not present

## 2015-07-14 DIAGNOSIS — R634 Abnormal weight loss: Secondary | ICD-10-CM | POA: Diagnosis not present

## 2015-07-14 DIAGNOSIS — R1010 Upper abdominal pain, unspecified: Secondary | ICD-10-CM | POA: Diagnosis not present

## 2015-07-14 DIAGNOSIS — K210 Gastro-esophageal reflux disease with esophagitis: Secondary | ICD-10-CM | POA: Diagnosis not present

## 2015-07-21 DIAGNOSIS — R109 Unspecified abdominal pain: Secondary | ICD-10-CM | POA: Diagnosis not present

## 2015-07-21 DIAGNOSIS — R1030 Lower abdominal pain, unspecified: Secondary | ICD-10-CM | POA: Diagnosis not present

## 2015-07-21 DIAGNOSIS — R319 Hematuria, unspecified: Secondary | ICD-10-CM | POA: Diagnosis not present

## 2015-07-26 ENCOUNTER — Ambulatory Visit: Payer: Self-pay | Admitting: Family Medicine

## 2015-07-26 DIAGNOSIS — K5669 Other intestinal obstruction: Secondary | ICD-10-CM | POA: Diagnosis not present

## 2015-07-26 DIAGNOSIS — I517 Cardiomegaly: Secondary | ICD-10-CM | POA: Diagnosis not present

## 2015-07-26 DIAGNOSIS — Z9981 Dependence on supplemental oxygen: Secondary | ICD-10-CM | POA: Diagnosis not present

## 2015-07-26 DIAGNOSIS — R9431 Abnormal electrocardiogram [ECG] [EKG]: Secondary | ICD-10-CM | POA: Diagnosis not present

## 2015-07-26 DIAGNOSIS — K5660 Unspecified intestinal obstruction: Secondary | ICD-10-CM | POA: Diagnosis not present

## 2015-07-26 DIAGNOSIS — J440 Chronic obstructive pulmonary disease with acute lower respiratory infection: Secondary | ICD-10-CM | POA: Diagnosis not present

## 2015-07-26 DIAGNOSIS — K639 Disease of intestine, unspecified: Secondary | ICD-10-CM | POA: Diagnosis not present

## 2015-07-26 DIAGNOSIS — R0902 Hypoxemia: Secondary | ICD-10-CM | POA: Diagnosis not present

## 2015-07-26 DIAGNOSIS — J439 Emphysema, unspecified: Secondary | ICD-10-CM | POA: Diagnosis not present

## 2015-07-26 DIAGNOSIS — J189 Pneumonia, unspecified organism: Secondary | ICD-10-CM | POA: Diagnosis not present

## 2015-07-26 DIAGNOSIS — I10 Essential (primary) hypertension: Secondary | ICD-10-CM | POA: Diagnosis not present

## 2015-07-26 DIAGNOSIS — R000 Tachycardia, unspecified: Secondary | ICD-10-CM | POA: Diagnosis not present

## 2015-07-26 DIAGNOSIS — J441 Chronic obstructive pulmonary disease with (acute) exacerbation: Secondary | ICD-10-CM | POA: Diagnosis not present

## 2015-07-26 DIAGNOSIS — R1084 Generalized abdominal pain: Secondary | ICD-10-CM | POA: Diagnosis not present

## 2015-07-26 DIAGNOSIS — J449 Chronic obstructive pulmonary disease, unspecified: Secondary | ICD-10-CM | POA: Diagnosis not present

## 2015-07-26 DIAGNOSIS — E7800 Pure hypercholesterolemia, unspecified: Secondary | ICD-10-CM | POA: Diagnosis not present

## 2015-07-27 DIAGNOSIS — J449 Chronic obstructive pulmonary disease, unspecified: Secondary | ICD-10-CM | POA: Diagnosis not present

## 2015-07-27 DIAGNOSIS — E7800 Pure hypercholesterolemia, unspecified: Secondary | ICD-10-CM | POA: Diagnosis present

## 2015-07-27 DIAGNOSIS — K639 Disease of intestine, unspecified: Secondary | ICD-10-CM | POA: Diagnosis not present

## 2015-07-27 DIAGNOSIS — K5669 Other intestinal obstruction: Secondary | ICD-10-CM | POA: Diagnosis not present

## 2015-07-27 DIAGNOSIS — R000 Tachycardia, unspecified: Secondary | ICD-10-CM | POA: Diagnosis not present

## 2015-07-27 DIAGNOSIS — J440 Chronic obstructive pulmonary disease with acute lower respiratory infection: Secondary | ICD-10-CM | POA: Diagnosis present

## 2015-07-27 DIAGNOSIS — Z79899 Other long term (current) drug therapy: Secondary | ICD-10-CM | POA: Diagnosis not present

## 2015-07-27 DIAGNOSIS — J439 Emphysema, unspecified: Secondary | ICD-10-CM | POA: Diagnosis not present

## 2015-07-27 DIAGNOSIS — I10 Essential (primary) hypertension: Secondary | ICD-10-CM | POA: Diagnosis present

## 2015-07-27 DIAGNOSIS — Z885 Allergy status to narcotic agent status: Secondary | ICD-10-CM | POA: Diagnosis not present

## 2015-07-27 DIAGNOSIS — Z888 Allergy status to other drugs, medicaments and biological substances status: Secondary | ICD-10-CM | POA: Diagnosis not present

## 2015-07-27 DIAGNOSIS — I517 Cardiomegaly: Secondary | ICD-10-CM | POA: Diagnosis not present

## 2015-07-27 DIAGNOSIS — Z9981 Dependence on supplemental oxygen: Secondary | ICD-10-CM | POA: Diagnosis not present

## 2015-07-27 DIAGNOSIS — F17209 Nicotine dependence, unspecified, with unspecified nicotine-induced disorders: Secondary | ICD-10-CM | POA: Diagnosis not present

## 2015-07-27 DIAGNOSIS — F329 Major depressive disorder, single episode, unspecified: Secondary | ICD-10-CM | POA: Diagnosis present

## 2015-07-27 DIAGNOSIS — R1084 Generalized abdominal pain: Secondary | ICD-10-CM | POA: Diagnosis not present

## 2015-07-27 DIAGNOSIS — F17200 Nicotine dependence, unspecified, uncomplicated: Secondary | ICD-10-CM | POA: Diagnosis present

## 2015-07-27 DIAGNOSIS — R9431 Abnormal electrocardiogram [ECG] [EKG]: Secondary | ICD-10-CM | POA: Diagnosis not present

## 2015-07-27 DIAGNOSIS — K5660 Unspecified intestinal obstruction: Secondary | ICD-10-CM | POA: Diagnosis present

## 2015-07-27 DIAGNOSIS — J441 Chronic obstructive pulmonary disease with (acute) exacerbation: Secondary | ICD-10-CM | POA: Diagnosis not present

## 2015-07-27 DIAGNOSIS — R0902 Hypoxemia: Secondary | ICD-10-CM | POA: Diagnosis not present

## 2015-07-27 DIAGNOSIS — J189 Pneumonia, unspecified organism: Secondary | ICD-10-CM | POA: Diagnosis not present

## 2015-07-28 ENCOUNTER — Ambulatory Visit: Payer: Self-pay | Admitting: Adult Health

## 2015-08-12 DIAGNOSIS — R1011 Right upper quadrant pain: Secondary | ICD-10-CM | POA: Diagnosis not present

## 2015-08-12 DIAGNOSIS — F5104 Psychophysiologic insomnia: Secondary | ICD-10-CM | POA: Diagnosis not present

## 2015-08-25 ENCOUNTER — Ambulatory Visit (INDEPENDENT_AMBULATORY_CARE_PROVIDER_SITE_OTHER): Payer: Medicare Other | Admitting: Adult Health

## 2015-08-25 ENCOUNTER — Encounter: Payer: Self-pay | Admitting: Adult Health

## 2015-08-25 VITALS — BP 156/80 | HR 100 | Temp 97.5°F | Ht 64.0 in | Wt 83.0 lb

## 2015-08-25 DIAGNOSIS — J441 Chronic obstructive pulmonary disease with (acute) exacerbation: Secondary | ICD-10-CM

## 2015-08-25 DIAGNOSIS — J9611 Chronic respiratory failure with hypoxia: Secondary | ICD-10-CM | POA: Diagnosis not present

## 2015-08-25 MED ORDER — PREDNISONE 10 MG PO TABS: ORAL_TABLET | ORAL | Status: DC

## 2015-08-25 NOTE — Assessment & Plan Note (Signed)
Cont on o2 .  

## 2015-08-25 NOTE — Patient Instructions (Addendum)
Prednisone taper over next week.  Discuss with family doctor that Lotrel may be causing your cough to be worse. Would avoid ACE inhibitors if possible  Mucinex DM Twice daily  .As needed  Cough/congestion -would use kid dose .  Continue on INCRUSE/BREO -rinse after use.  follow up Dr. Elsworth Soho  In 3 months and As needed   Please contact office for sooner follow up if symptoms do not improve or worsen or seek emergency care

## 2015-08-25 NOTE — Progress Notes (Signed)
Subjective:    Patient ID: Tamara Adams, female    DOB: Dec 17, 1940, 75 y.o.   MRN: [id number]  HPI 75 yo female smoker with COPD O2 dependent  Hx of bowel obstruction and chronic malnutrition  TEST  Spirometry 09/2010 showed FEV1 of 0.70-29%, ratio 32  CT angio chest 12/2014 was negative for pulmonary embolism and showed mild nodular densities in both lung bases with severe emphysema and hyperinflation. CXR  06/2015 [facility] change w/ nad.   08/25/2015 Follow up : COPD /O2 dependent  Returns for 2 month follow up .  Seen last for pulmonary consult for COPD .  She was having a COPD flare , given pred taper  says she got better but complains that wheezing and cough are back .  Hard to get up any mucus . Was on abx recently 1 month ago for bronchitis .  Recently in [facility] , for bowel obstruction . Says she is only some better. Struggles with this chronically. Has upcoming imaging for bowel issues.  Has stopped smoking.  Denies fever, orthopnea, edema or fever.    PVX , Prevnar , TDAP and Flu shot utd.  Remains on INCRUSE .and BREO .  On ACE inhibitor .   Past Medical History  Diagnosis Date  . COPD (chronic obstructive pulmonary disease) (HCC)     FVC 72%, FEV1 29%, FEV1 ratio 32% (very severe (COPD)  . Hypertension   . Pneumonia   . DVT (deep venous thrombosis) ([place])     "LLE; years ago after a surgery"  . Pinched nerve     in back  . Peptic ulcer   . Tubular adenoma of colon 09/2011  . H/O hiatal hernia   . Anemia     as a child  . Diverticulosis   . Hyperlipidemia   . Shortness of breath dyspnea     with exertion  . Fluttering sensation of heart     pt put on metoprolol as a result  . GERD (gastroesophageal reflux disease)     PMH  . Liver lesion   . Wears glasses   . Complication of anesthesia     " My blood gas dropped during surgery so I was left on a ventilator and in ICU for 3 days."  . PONV (postoperative nausea and vomiting)   . Skipped heart beats   . On  home oxygen therapy     "3L; sleep w/it; use it when I rest" (12/24/2014)  . Small bowel obstruction ([place])     "several times; OR twice" (12/24/2014)  . Headache     "maybe weekly" (12/24/2014)  . Degenerative disc disease   . Arthritis     "hands" (12/24/2014)  . Kidney stone    Current Outpatient Prescriptions on File Prior to Visit  Medication Sig Dispense Refill  . acetaminophen (TYLENOL) 500 MG tablet Take 500 mg by mouth every 6 (six) hours as needed for mild pain.    Marland Kitchen albuterol (PROVENTIL) (2.5 MG/3ML) 0.083% nebulizer solution USE 1 VIAL (3 ML) VIA NEBULIZER EVERY 6 HOURS AS NEEDED FOR WHEEZING OR SHORTNESS OF BREATH 810 mL 0  . amLODipine-benazepril (LOTREL) 5-40 MG per capsule TAKE 1 CAPSULE DAILY 90 capsule 0  . ENSURE PLUS (ENSURE PLUS) LIQD Take 2 Cans by mouth daily. For weight gain    . Fluticasone Furoate-Vilanterol 100-25 MCG/INH AEPB Inhale 1 puff into the lungs daily. 3 each 3  . Linaclotide (LINZESS) 290 MCG CAPS capsule Take 1 capsule (290  mcg total) by mouth daily. 90 capsule 4  . LORazepam (ATIVAN) 1 MG tablet Take 1 tablet (1 mg total) by mouth daily as needed for anxiety or sleep. 30 tablet 1  . Multiple Vitamin (MULTIVITAMIN WITH MINERALS) TABS tablet Take 1 tablet by mouth daily. Pt uses One-A-Day brand    . NICOTROL 10 MG inhaler Inhale 1-2 cartridges (1-2 continuous puffing total) into the lungs every 3 (three) hours as needed for smoking cessation. 168 each 0  . omeprazole (PRILOSEC) 40 MG capsule TAKE 1 CAPSULE DAILY FOR GASTROINTESTINAL ULCER 90 capsule 3  . ondansetron (ZOFRAN-ODT) 4 MG disintegrating tablet TAKE 1 TABLET EVERY 8 HOURS AS NEEDED FOR NAUSEA 20 tablet 0  . PARoxetine (PAXIL) 20 MG tablet Take 1 tablet (20 mg total) by mouth daily. 90 tablet 1  . PROAIR HFA 108 (90 BASE) MCG/ACT inhaler INHALE 2 TO 4 PUFFS INTO THE LUNGS EVERY 4 TO 6 HOURS AS NEEDED 25.5 g 2  . Umeclidinium Bromide (INCRUSE ELLIPTA) 62.5 MCG/INH AEPB Inhale 1 Inhaler into the lungs  daily. 90 each PRN  . TOPROL XL 25 MG 24 hr tablet TAKE 1 TABLET DAILY (Patient not taking: Reported on 08/25/2015) 90 tablet 1   No current facility-administered medications on file prior to visit.     Review of Systems Constitutional:   No  weight loss, night sweats,  Fevers, chills + fatigue, or  lassitude.  HEENT:   No headaches,  Difficulty swallowing,  Tooth/dental problems, or  Sore throat,                No sneezing, itching, ear ache, nasal congestion, post nasal drip,   CV:  No chest pain,  Orthopnea, PND, swelling in lower extremities, anasarca, dizziness, palpitations, syncope.   GI  No   bloody stools.   Resp:  No chest wall deformity  Skin: no rash or lesions.  GU: no dysuria, change in color of urine, no urgency or frequency.  No flank pain, no hematuria   MS:  No joint pain or swelling.  No decreased range of motion.  No back pain.  Psych:  No change in mood or affect. No depression or anxiety.  No memory loss.         Objective:   Physical Exam Filed Vitals:   08/25/15 1205  Pulse: 100  Temp: 97.5 F (36.4 C)  TempSrc: Oral  Height: '5\' 4"'$  (1.626 m)  Weight: 83 lb (37.649 kg)  SpO2: 94%   GEN: A/Ox3; pleasant , NAD, thin frail   HEENT:  [place]/AT,  EACs-clear, TMs-wnl, NOSE-clear, THROAT-clear, no lesions, no postnasal drip or exudate noted.   NECK:  Supple w/ fair ROM; no JVD; normal carotid impulses w/o bruits; no thyromegaly or nodules palpated; no lymphadenopathy.  RESP  Few trace wheezes on forced exp , no accessory muscle use, no dullness to percussion  CARD:  RRR, no m/r/g  , no peripheral edema, pulses intact, no cyanosis or clubbing.  GI:   Soft & nt; nml bowel sounds; no organomegaly or masses detected.  Musco: Warm bil, no deformities or joint swelling noted.   Neuro: alert, no focal deficits noted.    Skin: Warm, no lesions or rashes         Assessment & Plan:

## 2015-08-25 NOTE — Assessment & Plan Note (Signed)
Recurrent exacerbation  Congratulated on smoking cessation Would stop ACE - her b/;p is elevated and is on dual rx -will leave to PCP to change her rx  Would avoid ACE inhbitors going forward if able.   Plan  Prednisone taper over next week.  Discuss with family doctor that Lotrel may be causing your cough to be worse. Would avoid ACE inhibitors if possible  Mucinex DM Twice daily  .As needed  Cough/congestion -would use kid dose .  Continue on INCRUSE/BREO -rinse after use.  follow up Dr. Elsworth Soho  In 3 months and As needed   Please contact office for sooner follow up if symptoms do not improve or worsen or seek emergency care

## 2015-08-28 ENCOUNTER — Telehealth: Payer: Self-pay | Admitting: Family Medicine

## 2015-08-28 MED ORDER — AMLODIPINE BESYLATE 5 MG PO TABS: 5.0000 mg | ORAL_TABLET | Freq: Every day | ORAL | Status: DC

## 2015-08-28 MED ORDER — LOSARTAN POTASSIUM 100 MG PO TABS: 100.0000 mg | ORAL_TABLET | Freq: Every day | ORAL | Status: DC

## 2015-08-28 NOTE — Telephone Encounter (Signed)
Call pt: I got note from Pulmonology. We will d/c her Lotrel and change her to losartan and amlodipine. 2 new scripts sent. Try to make appt for BP check in 3-4 weeks.

## 2015-08-29 DIAGNOSIS — R110 Nausea: Secondary | ICD-10-CM | POA: Diagnosis not present

## 2015-08-29 DIAGNOSIS — R634 Abnormal weight loss: Secondary | ICD-10-CM | POA: Diagnosis not present

## 2015-08-29 DIAGNOSIS — R112 Nausea with vomiting, unspecified: Secondary | ICD-10-CM | POA: Diagnosis not present

## 2015-08-29 NOTE — Telephone Encounter (Signed)
Called pt and informed of med changes, she has picked the new meds up from her pharmacy. She wanted to know if she should continue to take the toprol? Will fwd to pcp for f/u.Tamara Adams [facility]

## 2015-08-29 NOTE — Progress Notes (Signed)
Reviewed & agree with plan  

## 2015-08-29 NOTE — Telephone Encounter (Signed)
Yes, continue the Toporl.

## 2015-08-31 NOTE — Telephone Encounter (Signed)
Called pt and informed her to continue taking the Toprol.Tamara Adams

## 2015-09-02 DIAGNOSIS — J441 Chronic obstructive pulmonary disease with (acute) exacerbation: Secondary | ICD-10-CM | POA: Diagnosis not present

## 2015-09-02 DIAGNOSIS — R0682 Tachypnea, not elsewhere classified: Secondary | ICD-10-CM | POA: Diagnosis not present

## 2015-09-02 DIAGNOSIS — R9431 Abnormal electrocardiogram [ECG] [EKG]: Secondary | ICD-10-CM | POA: Diagnosis not present

## 2015-09-02 DIAGNOSIS — F17210 Nicotine dependence, cigarettes, uncomplicated: Secondary | ICD-10-CM | POA: Diagnosis not present

## 2015-09-02 DIAGNOSIS — I10 Essential (primary) hypertension: Secondary | ICD-10-CM | POA: Diagnosis not present

## 2015-09-02 DIAGNOSIS — I493 Ventricular premature depolarization: Secondary | ICD-10-CM | POA: Diagnosis not present

## 2015-09-02 DIAGNOSIS — R05 Cough: Secondary | ICD-10-CM | POA: Diagnosis not present

## 2015-09-02 DIAGNOSIS — R062 Wheezing: Secondary | ICD-10-CM | POA: Diagnosis not present

## 2015-09-02 DIAGNOSIS — E7800 Pure hypercholesterolemia, unspecified: Secondary | ICD-10-CM | POA: Diagnosis not present

## 2015-09-02 DIAGNOSIS — R1011 Right upper quadrant pain: Secondary | ICD-10-CM | POA: Diagnosis not present

## 2015-09-03 DIAGNOSIS — R9431 Abnormal electrocardiogram [ECG] [EKG]: Secondary | ICD-10-CM | POA: Diagnosis not present

## 2015-09-03 DIAGNOSIS — I493 Ventricular premature depolarization: Secondary | ICD-10-CM | POA: Diagnosis not present

## 2015-09-14 ENCOUNTER — Telehealth: Payer: Self-pay | Admitting: Pulmonary Disease

## 2015-09-14 DIAGNOSIS — J9611 Chronic respiratory failure with hypoxia: Secondary | ICD-10-CM | POA: Diagnosis present

## 2015-09-14 DIAGNOSIS — R64 Cachexia: Secondary | ICD-10-CM | POA: Diagnosis not present

## 2015-09-14 DIAGNOSIS — J441 Chronic obstructive pulmonary disease with (acute) exacerbation: Secondary | ICD-10-CM | POA: Diagnosis present

## 2015-09-14 DIAGNOSIS — R319 Hematuria, unspecified: Secondary | ICD-10-CM | POA: Diagnosis not present

## 2015-09-14 DIAGNOSIS — J449 Chronic obstructive pulmonary disease, unspecified: Secondary | ICD-10-CM | POA: Diagnosis not present

## 2015-09-14 DIAGNOSIS — F17210 Nicotine dependence, cigarettes, uncomplicated: Secondary | ICD-10-CM | POA: Diagnosis not present

## 2015-09-14 DIAGNOSIS — I10 Essential (primary) hypertension: Secondary | ICD-10-CM | POA: Diagnosis present

## 2015-09-14 DIAGNOSIS — Z681 Body mass index (BMI) 19 or less, adult: Secondary | ICD-10-CM | POA: Diagnosis not present

## 2015-09-14 DIAGNOSIS — Z9981 Dependence on supplemental oxygen: Secondary | ICD-10-CM | POA: Diagnosis not present

## 2015-09-14 DIAGNOSIS — Z79899 Other long term (current) drug therapy: Secondary | ICD-10-CM | POA: Diagnosis not present

## 2015-09-14 DIAGNOSIS — E43 Unspecified severe protein-calorie malnutrition: Secondary | ICD-10-CM | POA: Diagnosis not present

## 2015-09-14 DIAGNOSIS — R0602 Shortness of breath: Secondary | ICD-10-CM | POA: Diagnosis not present

## 2015-09-14 DIAGNOSIS — Z888 Allergy status to other drugs, medicaments and biological substances status: Secondary | ICD-10-CM | POA: Diagnosis not present

## 2015-09-14 DIAGNOSIS — E860 Dehydration: Secondary | ICD-10-CM | POA: Diagnosis present

## 2015-09-14 NOTE — Telephone Encounter (Signed)
Called spoke with pt daughter. She reports pt has had increase SOB x 2 days now. She scheduled acute visit with MW tomorrow at 11:15 in the [facility] office. Daughter aware of address. Nothing further needed

## 2015-09-15 ENCOUNTER — Ambulatory Visit: Payer: Self-pay | Admitting: Internal Medicine

## 2015-09-22 DIAGNOSIS — K5900 Constipation, unspecified: Secondary | ICD-10-CM | POA: Diagnosis not present

## 2015-09-22 DIAGNOSIS — R1010 Upper abdominal pain, unspecified: Secondary | ICD-10-CM | POA: Diagnosis not present

## 2015-09-22 DIAGNOSIS — J449 Chronic obstructive pulmonary disease, unspecified: Secondary | ICD-10-CM | POA: Diagnosis not present

## 2015-09-22 DIAGNOSIS — Z79899 Other long term (current) drug therapy: Secondary | ICD-10-CM | POA: Diagnosis not present

## 2015-09-22 DIAGNOSIS — R110 Nausea: Secondary | ICD-10-CM | POA: Diagnosis not present

## 2015-09-22 DIAGNOSIS — R634 Abnormal weight loss: Secondary | ICD-10-CM | POA: Diagnosis not present

## 2015-09-22 DIAGNOSIS — F408 Other phobic anxiety disorders: Secondary | ICD-10-CM | POA: Diagnosis not present

## 2015-09-22 DIAGNOSIS — Z9981 Dependence on supplemental oxygen: Secondary | ICD-10-CM | POA: Diagnosis not present

## 2015-09-30 ENCOUNTER — Encounter: Payer: Self-pay | Admitting: *Deleted

## 2015-10-11 DIAGNOSIS — J449 Chronic obstructive pulmonary disease, unspecified: Secondary | ICD-10-CM | POA: Diagnosis not present

## 2015-10-11 DIAGNOSIS — G4719 Other hypersomnia: Secondary | ICD-10-CM | POA: Diagnosis not present

## 2015-10-11 DIAGNOSIS — R0683 Snoring: Secondary | ICD-10-CM | POA: Diagnosis not present

## 2015-10-11 DIAGNOSIS — Z720 Tobacco use: Secondary | ICD-10-CM | POA: Diagnosis not present

## 2015-10-11 DIAGNOSIS — I10 Essential (primary) hypertension: Secondary | ICD-10-CM | POA: Diagnosis not present

## 2015-10-12 DIAGNOSIS — I10 Essential (primary) hypertension: Secondary | ICD-10-CM | POA: Diagnosis not present

## 2015-10-12 DIAGNOSIS — K210 Gastro-esophageal reflux disease with esophagitis: Secondary | ICD-10-CM | POA: Diagnosis not present

## 2015-10-12 DIAGNOSIS — F17209 Nicotine dependence, unspecified, with unspecified nicotine-induced disorders: Secondary | ICD-10-CM | POA: Diagnosis not present

## 2015-10-12 DIAGNOSIS — K5904 Chronic idiopathic constipation: Secondary | ICD-10-CM | POA: Diagnosis not present

## 2015-10-12 DIAGNOSIS — E782 Mixed hyperlipidemia: Secondary | ICD-10-CM | POA: Diagnosis not present

## 2015-10-12 DIAGNOSIS — Z8719 Personal history of other diseases of the digestive system: Secondary | ICD-10-CM | POA: Diagnosis not present

## 2015-10-12 DIAGNOSIS — R110 Nausea: Secondary | ICD-10-CM | POA: Diagnosis not present

## 2015-10-12 DIAGNOSIS — M2550 Pain in unspecified joint: Secondary | ICD-10-CM | POA: Diagnosis not present

## 2015-10-12 DIAGNOSIS — J449 Chronic obstructive pulmonary disease, unspecified: Secondary | ICD-10-CM | POA: Diagnosis not present

## 2015-10-12 DIAGNOSIS — F5104 Psychophysiologic insomnia: Secondary | ICD-10-CM | POA: Diagnosis not present

## 2015-10-12 DIAGNOSIS — F3289 Other specified depressive episodes: Secondary | ICD-10-CM | POA: Diagnosis not present

## 2015-10-18 DIAGNOSIS — F17210 Nicotine dependence, cigarettes, uncomplicated: Secondary | ICD-10-CM | POA: Diagnosis not present

## 2015-10-18 DIAGNOSIS — Z681 Body mass index (BMI) 19 or less, adult: Secondary | ICD-10-CM | POA: Diagnosis not present

## 2015-10-18 DIAGNOSIS — F329 Major depressive disorder, single episode, unspecified: Secondary | ICD-10-CM | POA: Diagnosis not present

## 2015-10-18 DIAGNOSIS — F419 Anxiety disorder, unspecified: Secondary | ICD-10-CM | POA: Diagnosis not present

## 2015-10-18 DIAGNOSIS — E7800 Pure hypercholesterolemia, unspecified: Secondary | ICD-10-CM | POA: Diagnosis not present

## 2015-10-18 DIAGNOSIS — F5104 Psychophysiologic insomnia: Secondary | ICD-10-CM | POA: Diagnosis not present

## 2015-10-18 DIAGNOSIS — R935 Abnormal findings on diagnostic imaging of other abdominal regions, including retroperitoneum: Secondary | ICD-10-CM | POA: Diagnosis not present

## 2015-10-18 DIAGNOSIS — Z8719 Personal history of other diseases of the digestive system: Secondary | ICD-10-CM | POA: Diagnosis not present

## 2015-10-18 DIAGNOSIS — K5904 Chronic idiopathic constipation: Secondary | ICD-10-CM | POA: Diagnosis not present

## 2015-10-18 DIAGNOSIS — Z9981 Dependence on supplemental oxygen: Secondary | ICD-10-CM | POA: Diagnosis not present

## 2015-10-18 DIAGNOSIS — Z79899 Other long term (current) drug therapy: Secondary | ICD-10-CM | POA: Diagnosis not present

## 2015-10-18 DIAGNOSIS — K449 Diaphragmatic hernia without obstruction or gangrene: Secondary | ICD-10-CM | POA: Diagnosis not present

## 2015-10-18 DIAGNOSIS — F408 Other phobic anxiety disorders: Secondary | ICD-10-CM | POA: Diagnosis not present

## 2015-10-18 DIAGNOSIS — Z885 Allergy status to narcotic agent status: Secondary | ICD-10-CM | POA: Diagnosis not present

## 2015-10-18 DIAGNOSIS — K2270 Barrett's esophagus without dysplasia: Secondary | ICD-10-CM | POA: Diagnosis not present

## 2015-10-18 DIAGNOSIS — I10 Essential (primary) hypertension: Secondary | ICD-10-CM | POA: Diagnosis not present

## 2015-10-18 DIAGNOSIS — Z888 Allergy status to other drugs, medicaments and biological substances status: Secondary | ICD-10-CM | POA: Diagnosis not present

## 2015-10-18 DIAGNOSIS — R110 Nausea: Secondary | ICD-10-CM | POA: Diagnosis not present

## 2015-10-18 DIAGNOSIS — E782 Mixed hyperlipidemia: Secondary | ICD-10-CM | POA: Diagnosis not present

## 2015-10-18 DIAGNOSIS — Z8701 Personal history of pneumonia (recurrent): Secondary | ICD-10-CM | POA: Diagnosis not present

## 2015-10-18 DIAGNOSIS — K210 Gastro-esophageal reflux disease with esophagitis: Secondary | ICD-10-CM | POA: Diagnosis not present

## 2015-10-18 DIAGNOSIS — J441 Chronic obstructive pulmonary disease with (acute) exacerbation: Secondary | ICD-10-CM | POA: Diagnosis not present

## 2015-10-18 DIAGNOSIS — R634 Abnormal weight loss: Secondary | ICD-10-CM | POA: Diagnosis not present

## 2015-10-18 DIAGNOSIS — R933 Abnormal findings on diagnostic imaging of other parts of digestive tract: Secondary | ICD-10-CM | POA: Diagnosis not present

## 2015-10-31 ENCOUNTER — Other Ambulatory Visit: Payer: Self-pay | Admitting: Family Medicine

## 2015-12-02 ENCOUNTER — Emergency Department (HOSPITAL_BASED_OUTPATIENT_CLINIC_OR_DEPARTMENT_OTHER): Payer: Medicare Other

## 2015-12-02 ENCOUNTER — Encounter (HOSPITAL_BASED_OUTPATIENT_CLINIC_OR_DEPARTMENT_OTHER): Payer: Self-pay | Admitting: *Deleted

## 2015-12-02 ENCOUNTER — Inpatient Hospital Stay (HOSPITAL_BASED_OUTPATIENT_CLINIC_OR_DEPARTMENT_OTHER)
Admission: EM | Admit: 2015-12-02 | Discharge: 2015-12-06 | DRG: 388 | Disposition: A | Discharge status: Home / Self Care | Payer: Medicare Other | Attending: Internal Medicine | Admitting: Internal Medicine

## 2015-12-02 DIAGNOSIS — M5136 Other intervertebral disc degeneration, lumbar region: Secondary | ICD-10-CM | POA: Diagnosis present

## 2015-12-02 DIAGNOSIS — Z681 Body mass index (BMI) 19 or less, adult: Secondary | ICD-10-CM | POA: Diagnosis not present

## 2015-12-02 DIAGNOSIS — F329 Major depressive disorder, single episode, unspecified: Secondary | ICD-10-CM | POA: Diagnosis not present

## 2015-12-02 DIAGNOSIS — F418 Other specified anxiety disorders: Secondary | ICD-10-CM | POA: Diagnosis present

## 2015-12-02 DIAGNOSIS — K5900 Constipation, unspecified: Secondary | ICD-10-CM | POA: Diagnosis not present

## 2015-12-02 DIAGNOSIS — Z888 Allergy status to other drugs, medicaments and biological substances status: Secondary | ICD-10-CM

## 2015-12-02 DIAGNOSIS — Z9841 Cataract extraction status, right eye: Secondary | ICD-10-CM

## 2015-12-02 DIAGNOSIS — J449 Chronic obstructive pulmonary disease, unspecified: Secondary | ICD-10-CM | POA: Diagnosis present

## 2015-12-02 DIAGNOSIS — M51369 Other intervertebral disc degeneration, lumbar region without mention of lumbar back pain or lower extremity pain: Secondary | ICD-10-CM | POA: Diagnosis present

## 2015-12-02 DIAGNOSIS — E785 Hyperlipidemia, unspecified: Secondary | ICD-10-CM | POA: Diagnosis present

## 2015-12-02 DIAGNOSIS — K567 Ileus, unspecified: Secondary | ICD-10-CM | POA: Diagnosis present

## 2015-12-02 DIAGNOSIS — J42 Unspecified chronic bronchitis: Secondary | ICD-10-CM

## 2015-12-02 DIAGNOSIS — E43 Unspecified severe protein-calorie malnutrition: Secondary | ICD-10-CM | POA: Diagnosis not present

## 2015-12-02 DIAGNOSIS — Z87442 Personal history of urinary calculi: Secondary | ICD-10-CM

## 2015-12-02 DIAGNOSIS — Z7952 Long term (current) use of systemic steroids: Secondary | ICD-10-CM

## 2015-12-02 DIAGNOSIS — K5669 Other intestinal obstruction: Principal | ICD-10-CM | POA: Diagnosis present

## 2015-12-02 DIAGNOSIS — I10 Essential (primary) hypertension: Secondary | ICD-10-CM | POA: Diagnosis present

## 2015-12-02 DIAGNOSIS — Z885 Allergy status to narcotic agent status: Secondary | ICD-10-CM

## 2015-12-02 DIAGNOSIS — Z87891 Personal history of nicotine dependence: Secondary | ICD-10-CM

## 2015-12-02 DIAGNOSIS — Z79899 Other long term (current) drug therapy: Secondary | ICD-10-CM

## 2015-12-02 DIAGNOSIS — Z09 Encounter for follow-up examination after completed treatment for conditions other than malignant neoplasm: Secondary | ICD-10-CM

## 2015-12-02 DIAGNOSIS — G8929 Other chronic pain: Secondary | ICD-10-CM | POA: Diagnosis present

## 2015-12-02 DIAGNOSIS — Z8711 Personal history of peptic ulcer disease: Secondary | ICD-10-CM

## 2015-12-02 DIAGNOSIS — F419 Anxiety disorder, unspecified: Secondary | ICD-10-CM | POA: Diagnosis not present

## 2015-12-02 DIAGNOSIS — Z7951 Long term (current) use of inhaled steroids: Secondary | ICD-10-CM

## 2015-12-02 DIAGNOSIS — J441 Chronic obstructive pulmonary disease with (acute) exacerbation: Secondary | ICD-10-CM | POA: Diagnosis present

## 2015-12-02 DIAGNOSIS — E876 Hypokalemia: Secondary | ICD-10-CM | POA: Diagnosis present

## 2015-12-02 DIAGNOSIS — Z90710 Acquired absence of both cervix and uterus: Secondary | ICD-10-CM

## 2015-12-02 DIAGNOSIS — K56609 Unspecified intestinal obstruction, unspecified as to partial versus complete obstruction: Secondary | ICD-10-CM | POA: Diagnosis present

## 2015-12-02 DIAGNOSIS — Z9981 Dependence on supplemental oxygen: Secondary | ICD-10-CM | POA: Diagnosis not present

## 2015-12-02 DIAGNOSIS — R1084 Generalized abdominal pain: Secondary | ICD-10-CM | POA: Diagnosis present

## 2015-12-02 DIAGNOSIS — K219 Gastro-esophageal reflux disease without esophagitis: Secondary | ICD-10-CM | POA: Diagnosis present

## 2015-12-02 DIAGNOSIS — D1803 Hemangioma of intra-abdominal structures: Secondary | ICD-10-CM | POA: Diagnosis not present

## 2015-12-02 DIAGNOSIS — Z86718 Personal history of other venous thrombosis and embolism: Secondary | ICD-10-CM

## 2015-12-02 DIAGNOSIS — Z9842 Cataract extraction status, left eye: Secondary | ICD-10-CM

## 2015-12-02 HISTORY — DX: Essential (primary) hypertension: I10

## 2015-12-02 HISTORY — DX: Other specified anxiety disorders: F41.8

## 2015-12-02 LAB — CBC WITH DIFFERENTIAL/PLATELET
Basophils Absolute: 0.1 10*3/uL (ref 0.0–0.1)
Basophils Relative: 1 %
EOS ABS: 0.1 10*3/uL (ref 0.0–0.7)
Eosinophils Relative: 2 %
HCT: 37.4 % (ref 36.0–46.0)
HEMOGLOBIN: 12.3 g/dL (ref 12.0–15.0)
LYMPHS ABS: 1.4 10*3/uL (ref 0.7–4.0)
LYMPHS PCT: 19 %
MCH: 29.3 pg (ref 26.0–34.0)
MCHC: 32.9 g/dL (ref 30.0–36.0)
MCV: 89 fL (ref 78.0–100.0)
Monocytes Absolute: 0.6 10*3/uL (ref 0.1–1.0)
Monocytes Relative: 8 %
NEUTROS ABS: 5.2 10*3/uL (ref 1.7–7.7)
NEUTROS PCT: 70 %
Platelets: 441 10*3/uL — ABNORMAL HIGH (ref 150–400)
RBC: 4.2 MIL/uL (ref 3.87–5.11)
RDW: 13.8 % (ref 11.5–15.5)
WBC: 7.5 10*3/uL (ref 4.0–10.5)

## 2015-12-02 LAB — COMPREHENSIVE METABOLIC PANEL
ALBUMIN: 3.5 g/dL (ref 3.5–5.0)
ALK PHOS: 80 U/L (ref 38–126)
ALT: 17 U/L (ref 14–54)
AST: 30 U/L (ref 15–41)
Anion gap: 4 — ABNORMAL LOW (ref 5–15)
BILIRUBIN TOTAL: 0.3 mg/dL (ref 0.3–1.2)
BUN: 8 mg/dL (ref 6–20)
CO2: 35 mmol/L — AB (ref 22–32)
Calcium: 9 mg/dL (ref 8.9–10.3)
Chloride: 96 mmol/L — ABNORMAL LOW (ref 101–111)
Creatinine, Ser: 0.6 mg/dL (ref 0.44–1.00)
GFR calc Af Amer: >60 mL/min (ref >60)
GFR calc non Af Amer: >60 mL/min (ref >60)
GLUCOSE: 121 mg/dL — AB (ref 65–99)
POTASSIUM: 3.4 mmol/L — AB (ref 3.5–5.1)
Sodium: 135 mmol/L (ref 135–145)
TOTAL PROTEIN: 6.6 g/dL (ref 6.5–8.1)

## 2015-12-02 LAB — URINALYSIS, ROUTINE W REFLEX MICROSCOPIC
Bilirubin Urine: NEGATIVE
Glucose, UA: NEGATIVE mg/dL
Ketones, ur: 15 mg/dL — AB
LEUKOCYTES UA: NEGATIVE
NITRITE: NEGATIVE
Protein, ur: 100 mg/dL — AB
SPECIFIC GRAVITY, URINE: 1.012 (ref 1.005–1.030)
pH: 7 (ref 5.0–8.0)

## 2015-12-02 LAB — URINE MICROSCOPIC-ADD ON

## 2015-12-02 LAB — PROTIME-INR
INR: 1.18 (ref 0.00–1.49)
PROTHROMBIN TIME: 15.2 s (ref 11.6–15.2)

## 2015-12-02 LAB — APTT: APTT: 31 s (ref 24–37)

## 2015-12-02 LAB — LIPASE, BLOOD: Lipase: 35 U/L (ref 11–51)

## 2015-12-02 MED ORDER — ALBUTEROL SULFATE (2.5 MG/3ML) 0.083% IN NEBU
2.5000 mg | INHALATION_SOLUTION | RESPIRATORY_TRACT | Status: DC | PRN
  Administered 2015-12-05: 2.5 mg via RESPIRATORY_TRACT
  Filled 2015-12-02: qty 3

## 2015-12-02 MED ORDER — MORPHINE SULFATE (PF) 2 MG/ML IV SOLN
1.0000 mg | INTRAVENOUS | Status: DC | PRN
  Administered 2015-12-02 – 2015-12-05 (×15): 1 mg via INTRAVENOUS
  Filled 2015-12-02 (×15): qty 1

## 2015-12-02 MED ORDER — POTASSIUM CHLORIDE 10 MEQ/100ML IV SOLN
10.0000 meq | INTRAVENOUS | Status: AC
  Administered 2015-12-02 (×2): 10 meq via INTRAVENOUS
  Filled 2015-12-02 (×2): qty 100

## 2015-12-02 MED ORDER — SODIUM CHLORIDE 0.9 % IV SOLN
INTRAVENOUS | Status: DC
  Administered 2015-12-02 – 2015-12-05 (×4): via INTRAVENOUS

## 2015-12-02 MED ORDER — ONDANSETRON HCL 4 MG/2ML IJ SOLN
4.0000 mg | Freq: Once | INTRAMUSCULAR | Status: AC
  Administered 2015-12-02: 4 mg via INTRAVENOUS
  Filled 2015-12-02: qty 2

## 2015-12-02 MED ORDER — IPRATROPIUM BROMIDE 0.02 % IN SOLN: 0.5000 mg | RESPIRATORY_TRACT | Status: DC

## 2015-12-02 MED ORDER — MORPHINE SULFATE (PF) 4 MG/ML IV SOLN
4.0000 mg | Freq: Once | INTRAVENOUS | Status: AC
  Administered 2015-12-02: 4 mg via INTRAVENOUS
  Filled 2015-12-02: qty 1

## 2015-12-02 MED ORDER — IOPAMIDOL (ISOVUE-300) INJECTION 61%
100.0000 mL | Freq: Once | INTRAVENOUS | Status: AC | PRN
  Administered 2015-12-02: 100 mL via INTRAVENOUS

## 2015-12-02 MED ORDER — ACETAMINOPHEN 500 MG PO TABS
500.0000 mg | ORAL_TABLET | Freq: Four times a day (QID) | ORAL | Status: DC | PRN
  Administered 2015-12-05: 500 mg via ORAL
  Filled 2015-12-02: qty 1

## 2015-12-02 MED ORDER — AMLODIPINE BESYLATE 5 MG PO TABS
5.0000 mg | ORAL_TABLET | Freq: Every day | ORAL | Status: DC
  Administered 2015-12-03 – 2015-12-06 (×4): 5 mg via ORAL
  Filled 2015-12-02 (×5): qty 1

## 2015-12-02 MED ORDER — ONDANSETRON HCL 4 MG/2ML IJ SOLN
4.0000 mg | Freq: Three times a day (TID) | INTRAMUSCULAR | Status: AC | PRN
  Administered 2015-12-02: 4 mg via INTRAVENOUS
  Filled 2015-12-02 (×2): qty 2

## 2015-12-02 MED ORDER — LINACLOTIDE 145 MCG PO CAPS
290.0000 ug | ORAL_CAPSULE | Freq: Every day | ORAL | Status: DC
  Administered 2015-12-03 – 2015-12-06 (×4): 290 ug via ORAL
  Filled 2015-12-02 (×5): qty 2

## 2015-12-02 MED ORDER — NICOTINE 10 MG IN INHA: 1.0000 | RESPIRATORY_TRACT | Status: DC | PRN

## 2015-12-02 MED ORDER — ADULT MULTIVITAMIN W/MINERALS CH
1.0000 | ORAL_TABLET | Freq: Every day | ORAL | Status: DC
  Administered 2015-12-04 – 2015-12-06 (×2): 1 via ORAL
  Filled 2015-12-02 (×3): qty 1

## 2015-12-02 MED ORDER — PAROXETINE HCL 20 MG PO TABS
20.0000 mg | ORAL_TABLET | Freq: Every day | ORAL | Status: DC
  Administered 2015-12-03 – 2015-12-06 (×4): 20 mg via ORAL
  Filled 2015-12-02 (×5): qty 1

## 2015-12-02 MED ORDER — SODIUM CHLORIDE 0.9% FLUSH
3.0000 mL | Freq: Two times a day (BID) | INTRAVENOUS | Status: DC
  Administered 2015-12-02 – 2015-12-06 (×3): 3 mL via INTRAVENOUS

## 2015-12-02 MED ORDER — LORAZEPAM 1 MG PO TABS: 1.0000 mg | ORAL_TABLET | Freq: Every day | ORAL | Status: DC | PRN

## 2015-12-02 MED ORDER — PANTOPRAZOLE SODIUM 40 MG IV SOLR
40.0000 mg | INTRAVENOUS | Status: DC
  Administered 2015-12-02 – 2015-12-03 (×2): 40 mg via INTRAVENOUS
  Filled 2015-12-02 (×2): qty 40

## 2015-12-02 MED ORDER — SODIUM CHLORIDE 0.9 % IV SOLN: INTRAVENOUS | Status: DC

## 2015-12-02 MED ORDER — LOSARTAN POTASSIUM 50 MG PO TABS
100.0000 mg | ORAL_TABLET | Freq: Every day | ORAL | Status: DC
  Administered 2015-12-03 – 2015-12-06 (×4): 100 mg via ORAL
  Filled 2015-12-02 (×4): qty 2

## 2015-12-02 MED ORDER — SODIUM CHLORIDE 0.9 % IV BOLUS (SEPSIS)
500.0000 mL | Freq: Once | INTRAVENOUS | Status: AC
  Administered 2015-12-02: 500 mL via INTRAVENOUS

## 2015-12-02 MED ORDER — NICOTINE 21 MG/24HR TD PT24: 21.0000 mg | MEDICATED_PATCH | Freq: Every day | TRANSDERMAL | Status: DC | PRN

## 2015-12-02 MED ORDER — LORAZEPAM 2 MG/ML IJ SOLN: 0.5000 mg | Freq: Two times a day (BID) | INTRAMUSCULAR | Status: DC | PRN

## 2015-12-02 MED ORDER — HYDRALAZINE HCL 20 MG/ML IJ SOLN
5.0000 mg | INTRAMUSCULAR | Status: DC | PRN
Start: 2015-12-02 — End: 2015-12-05
  Administered 2015-12-04 – 2015-12-05 (×2): 5 mg via INTRAVENOUS
  Filled 2015-12-02 (×2): qty 1

## 2015-12-02 MED ORDER — DM-GUAIFENESIN ER 30-600 MG PO TB12
1.0000 | ORAL_TABLET | Freq: Two times a day (BID) | ORAL | Status: DC | PRN
  Filled 2015-12-02: qty 1

## 2015-12-02 MED ORDER — METHYLPREDNISOLONE SODIUM SUCC 40 MG IJ SOLR
40.0000 mg | Freq: Two times a day (BID) | INTRAMUSCULAR | Status: DC
  Administered 2015-12-02: 40 mg via INTRAVENOUS
  Filled 2015-12-02: qty 1

## 2015-12-02 MED ORDER — UMECLIDINIUM BROMIDE 62.5 MCG/INH IN AEPB: 1.0000 | INHALATION_SPRAY | Freq: Every day | RESPIRATORY_TRACT | Status: DC

## 2015-12-02 MED ORDER — HYDROMORPHONE HCL 1 MG/ML IJ SOLN: 0.5000 mg | INTRAMUSCULAR | Status: DC | PRN

## 2015-12-02 MED ORDER — FLUTICASONE FUROATE-VILANTEROL 100-25 MCG/INH IN AEPB
1.0000 | INHALATION_SPRAY | Freq: Every day | RESPIRATORY_TRACT | Status: DC
  Administered 2015-12-03 – 2015-12-06 (×4): 1 via RESPIRATORY_TRACT
  Filled 2015-12-02: qty 28

## 2015-12-02 NOTE — Progress Notes (Signed)
Patient arrived to floor in 6N28 from [place]. Alert and oriented x4,VSS. Oriented to room and call bell. Admitting md paged.

## 2015-12-02 NOTE — ED Notes (Signed)
6 n  rn unable to take report at present

## 2015-12-02 NOTE — H&P (Signed)
History and Physical    Tamara Adams IWL:[id number] DOB: October 13, 1940 DOA: 12/02/2015  Referring MD/NP/PA:   PCP: Beatrice Lecher, MD   Patient coming from: The patient is coming from home. At baseline, pt is independent for most of his ADL.  Chief Complaint: Abdominal pain, nausea and vomiting  HPI: Tamara Adams is a 75 y.o. female with medical history significant of small bowel obstruction (s/p surgery x 2), COPD on 4 L of oxygen, hypertension, hyperlipidemia, GERD, depression, sciatica, peptic ulcer, kidney stone, who presents with nausea, vomiting and abdominal pain.  The patient reports that she has chronic abdominal pain, which has been worsening in the past several weeks. Her abdominal pain is diffuse, constant, dull, 6 out of 10 in severity, nonradiating. It is not aggravated or alleviated by any known factors. She has nausea and vomiting, but did not have vomiting in the past 2 days. Patient does not have diarrhea. No fever or chills. Patient reports mild cough with little white mucus production, which is at her baseline due to CODP. She states that she did not use her inhaler for the whole day today and feels little more shortness of breatch. Patient denies chest pain, symptoms of UTI or unilateral weakness.  ED Course: pt was found to have negative urinalysis, lipase 35, WBC 7.5, temperature normal, potassium 3.4, renal function okay. CT abdomen/pelvis showed ileus without focal transition point. NG tube was started in ED. Patient is placed on MedSurg bed for observation.  Review of Systems:   General: no fevers, chills, no changes in body weight, has poor appetite, has fatigue HEENT: no blurry vision, hearing changes or sore throat Pulm: has dyspnea, coughing, wheezing CV: no chest pain, no palpitations Abd: has nausea, vomiting, abdominal pain, no diarrhea, constipation GU: no dysuria, burning on urination, increased urinary frequency, hematuria  Ext: no leg edema Neuro: no  unilateral weakness, numbness, or tingling, no vision change or hearing loss Skin: no rash MSK: No muscle spasm, no deformity, no limitation of range of movement in spin Heme: No easy bruising.  Travel history: No recent long distant travel.  Allergy:  Allergies  Allergen Reactions  . Maxidex [Dexamethasone] Other (See Comments)    dehydration  . Advair Diskus [Fluticasone-Salmeterol]     No benefit with lungs  . Trazodone And Nefazodone Other (See Comments)    Heart pounding  . Tylox [Oxycodone-Acetaminophen] Itching    Past Medical History  Diagnosis Date  . COPD (chronic obstructive pulmonary disease) (HCC)     FVC 72%, FEV1 29%, FEV1 ratio 32% (very severe (COPD)  . Hypertension   . Pneumonia   . DVT (deep venous thrombosis) ([place])     "LLE; years ago after a surgery"  . Pinched nerve     in back  . Peptic ulcer   . Tubular adenoma of colon 09/2011  . H/O hiatal hernia   . Anemia     as a child  . Diverticulosis   . Hyperlipidemia   . Shortness of breath dyspnea     with exertion  . Fluttering sensation of heart     pt put on metoprolol as a result  . GERD (gastroesophageal reflux disease)     PMH  . Liver lesion   . Wears glasses   . Complication of anesthesia     " My blood gas dropped during surgery so I was left on a ventilator and in ICU for 3 days."  . PONV (postoperative nausea and vomiting)   . Skipped  heart beats   . On home oxygen therapy     "3L; sleep w/it; use it when I rest" (12/24/2014)  . Small bowel obstruction ([place])     "several times; OR twice" (12/24/2014)  . Headache     "maybe weekly" (12/24/2014)  . Degenerative disc disease   . Arthritis     "hands" (12/24/2014)  . Kidney stone   . Essential hypertension   . COPD (chronic obstructive pulmonary disease) ([place])   . Depression with anxiety     Past Surgical History  Procedure Laterality Date  . Abdominal hysterectomy  1987    and one ovary  . Oophorectomy  1992  . Cataract extraction  w/phaco Left 06/12/2012    Dr. Boykin Reaper  . Cataract extraction w/ intraocular lens  implant, bilateral Right 06/22/2012    Dr. Lester Kinsman.   Marland Kitchen Urethral dilation    . Small intestine surgery      for a blockage, no bowel was removed, just kinked from scar tissue  . Cholecystectomy N/A 08/18/2013    Procedure: LAPAROSCOPIC CHOLECYSTECTOMY;  Surgeon: Harl Bowie, MD;  Location: [facility];  Service: General;  Laterality: N/A;  . Dilation and curettage of uterus    . Tubal ligation    . Multiple tooth extractions    . Hernia repair    . Exploratory laparotomy  12/24/2014  . Abdominal adhesion surgery  12/24/2014  . Incisional hernia repair  12/24/2014  . Laparotomy N/A 12/24/2014    Procedure: EXPLORATORY LAPAROTOMY;  Surgeon: Coralie Keens, MD;  Location: [facility];  Service: General;  Laterality: N/A;  . Lysis of adhesion N/A 12/24/2014    Procedure: LYSIS OF ADHESION;  Surgeon: Coralie Keens, MD;  Location: [facility];  Service: General;  Laterality: N/A;  . Incisional hernia repair N/A 12/24/2014    Procedure: HERNIA REPAIR INCISIONAL;  Surgeon: Coralie Keens, MD;  Location: [facility];  Service: General;  Laterality: N/A;    Social History:  reports that she quit smoking about 4 months ago. Her smoking use included Cigarettes. She has a 14.25 pack-year smoking history. She has never used smokeless tobacco. She reports that she does not drink alcohol or use illicit drugs.  Family History:  Family History  Problem Relation Age of Onset  . Hypertension Mother   . Ovarian cancer Mother   . Glaucoma Mother   . Colon cancer Neg Hx   . Colon polyps Neg Hx   . Diabetes Neg Hx   . Kidney disease Neg Hx   . Gallbladder disease Neg Hx   . Esophageal cancer Neg Hx   . Glaucoma Sister      Prior to Admission medications   Medication Sig Start Date End Date Taking? Authorizing Provider  acetaminophen (TYLENOL) 500 MG tablet Take 500 mg by mouth every 6 (six) hours as needed for mild pain.     Historical Provider, MD  albuterol (PROVENTIL) (2.5 MG/3ML) 0.083% nebulizer solution USE 1 VIAL (3 ML) VIA NEBULIZER EVERY 6 HOURS AS NEEDED FOR WHEEZING OR SHORTNESS OF BREATH 10/31/15   [name], MD  amLODipine (NORVASC) 5 MG tablet Take 1 tablet (5 mg total) by mouth daily. 08/28/15   [name], MD  BREO ELLIPTA 100-25 MCG/INH AEPB USE 1 INHALATION DAILY 10/31/15   [name], MD  ENSURE PLUS (ENSURE PLUS) LIQD Take 2 Cans by mouth daily. For weight gain    Historical Provider, MD  Linaclotide (LINZESS) 290 MCG CAPS capsule Take 1  capsule (290 mcg total) by mouth daily. 03/08/15   [name], MD  LORazepam (ATIVAN) 1 MG tablet Take 1 tablet (1 mg total) by mouth daily as needed for anxiety or sleep. 04/25/15   [name], MD  losartan (COZAAR) 100 MG tablet Take 1 tablet (100 mg total) by mouth daily. 08/28/15   [name], MD  Multiple Vitamin (MULTIVITAMIN WITH MINERALS) TABS tablet Take 1 tablet by mouth daily. Pt uses One-A-Day brand    Historical Provider, MD  NICOTROL 10 MG inhaler Inhale 1-2 cartridges (1-2 continuous puffing total) into the lungs every 3 (three) hours as needed for smoking cessation. 06/23/15   Rigoberto Noel, MD  omeprazole (PRILOSEC) 40 MG capsule TAKE 1 CAPSULE DAILY FOR GASTROINTESTINAL ULCER 02/24/15   [name], MD  ondansetron (ZOFRAN-ODT) 4 MG disintegrating tablet TAKE 1 TABLET EVERY 8 HOURS AS NEEDED FOR NAUSEA 06/09/15   [name], MD  PARoxetine (PAXIL) 20 MG tablet Take 1 tablet (20 mg total) by mouth daily. 06/09/15   [name], MD  predniSONE (DELTASONE) 10 MG tablet 4 tabs for 2 days, then 3 tabs for 2 days, 2 tabs for 2 days, then 1 tab for 2 days, then stop 08/25/15   Melvenia Needles, NP  PROAIR HFA 108 (90 Base) MCG/ACT inhaler USE 2 TO 4 INHALATIONS EVERY 4 TO 6 HOURS AS NEEDED 10/31/15   [name], MD  TOPROL XL 25 MG 24 hr tablet TAKE 1 TABLET DAILY Patient  not taking: Reported on 08/25/2015 06/09/15   [name], MD  Umeclidinium Bromide (INCRUSE ELLIPTA) 62.5 MCG/INH AEPB Inhale 1 Inhaler into the lungs daily. 04/25/15   [name], MD    Physical Exam: Filed Vitals:   12/02/15 1624 12/02/15 1752 12/02/15 1946 12/02/15 2126  BP: 152/62 148/57 165/61 143/50  Pulse: 94 88 85 81  Temp:    98.2 F (36.8 C)  TempSrc:    Oral  Resp: '18 18 18 19  '$ Height:    5' 3.5" (1.613 m)  Weight:    37.195 kg (82 lb)  SpO2: 100% 100% 99% 100%   General: Not in acute distress HEENT:       Eyes: PERRL, EOMI, no scleral icterus.       ENT: No discharge from the ears and nose, no pharynx injection, no tonsillar enlargement.        Neck: No JVD, no bruit, no mass felt. Heme: No neck lymph node enlargement. Cardiac: S1/S2, RRR, No murmurs, No gallops or rubs. Pulm: has mild wheezing bilaterally. No rales, rhonchi or rubs. Abd: Soft, nondistended, tenderness diffusely, no rebound pain, no organomegaly, BS diminished. GU: No hematuria Ext: No pitting leg edema bilaterally. 2+DP/PT pulse bilaterally. Musculoskeletal: No joint deformities, No joint redness or warmth, no limitation of ROM in spin. Skin: No rashes.  Neuro: Alert, oriented X3, cranial nerves II-XII grossly intact, moves all extremities normally. Psych: Patient is not psychotic, no suicidal or hemocidal ideation. Labs on Admission: I have personally reviewed following labs and imaging studies  CBC:  Recent Labs Lab 12/02/15 1345  WBC 7.5  NEUTROABS 5.2  HGB 12.3  HCT 37.4  MCV 89.0  PLT 170*   Basic Metabolic Panel:  Recent Labs Lab 12/02/15 1345  NA 135  K 3.4*  CL 96*  CO2 35*  GLUCOSE 121*  BUN 8  CREATININE 0.60  CALCIUM 9.0   GFR: Estimated Creatinine Clearance: 36.2 mL/min (by C-G  formula based on Cr of 0.6). Liver Function Tests:  Recent Labs Lab 12/02/15 1345  AST 30  ALT 17  ALKPHOS 80  BILITOT 0.3  PROT 6.6  ALBUMIN 3.5    Recent  Labs Lab 12/02/15 1345  LIPASE 35   No results for input(s): AMMONIA in the last 168 hours. Coagulation Profile: No results for input(s): INR, PROTIME in the last 168 hours. Cardiac Enzymes: No results for input(s): CKTOTAL, CKMB, CKMBINDEX, TROPONINI in the last 168 hours. BNP (last 3 results) No results for input(s): PROBNP in the last 8760 hours. HbA1C: No results for input(s): HGBA1C in the last 72 hours. CBG: No results for input(s): GLUCAP in the last 168 hours. Lipid Profile: No results for input(s): CHOL, HDL, LDLCALC, TRIG, CHOLHDL, LDLDIRECT in the last 72 hours. Thyroid Function Tests: No results for input(s): TSH, T4TOTAL, FREET4, T3FREE, THYROIDAB in the last 72 hours. Anemia Panel: No results for input(s): VITAMINB12, FOLATE, FERRITIN, TIBC, IRON, RETICCTPCT in the last 72 hours. Urine analysis:    Component Value Date/Time   COLORURINE YELLOW 12/02/2015 [place] 12/02/2015 1328   LABSPEC 1.012 12/02/2015 1328   PHURINE 7.0 12/02/2015 1328   GLUCOSEU NEGATIVE 12/02/2015 1328   HGBUR MODERATE* 12/02/2015 1328   BILIRUBINUR NEGATIVE 12/02/2015 1328   BILIRUBINUR NEG 02/12/2014 1207   KETONESUR 15* 12/02/2015 1328   PROTEINUR 100* 12/02/2015 1328   PROTEINUR NEG 02/12/2014 1207   UROBILINOGEN 0.2 03/06/2015 2133   UROBILINOGEN 0.2 02/12/2014 1207   NITRITE NEGATIVE 12/02/2015 1328   NITRITE NEG 02/12/2014 1207   LEUKOCYTESUR NEGATIVE 12/02/2015 1328   Sepsis Labs: '@LABRCNTIP'$ (procalcitonin:4,lacticidven:4) )No results found for this or any previous visit (from the past 240 hour(s)).   Radiological Exams on Admission: Ct Abdomen Pelvis W Contrast  12/02/2015  CLINICAL DATA:  Abdominal pain. Nausea and vomiting for 2 weeks. Constipation. EXAM: CT ABDOMEN AND PELVIS WITH CONTRAST TECHNIQUE: Multidetector CT imaging of the abdomen and pelvis was performed using the standard protocol following bolus administration of intravenous contrast.  CONTRAST:  122m ISOVUE-300 IOPAMIDOL (ISOVUE-300) INJECTION 61% COMPARISON:  None. FINDINGS: Lower chest: The lung bases are clear without focal nodule, mass, or airspace disease. The heart size is normal. No significant pleural or pericardial effusion is present. Hepatobiliary: Scattered sub cm low-density lesions in the liver are compatible with cysts. These are stable. A hemangioma near the inferior aspect of the right lobe of the liver is stable as well. Cholecystectomy is noted. The common bile duct is within normal limits. Pancreas: Mild pancreatic duct dilation is stable. No obstructing lesion is evident. The pancreas is otherwise unremarkable. Spleen: Within normal limits Adrenals/Urinary Tract: The adrenal glands are within normal limits bilaterally. The kidneys and ureters are within normal limits. Urinary bladder is mildly distended sub cm cysts in both kidneys are stable. Stomach/Bowel: The stomach is mildly distended. The duodenum is unremarkable. Oral contrast is present throughout small bowel without obstruction. The distal small bowel is partially collapsed. There is moderate stool throughout the colon. No obstruction is present. Vascular/Lymphatic: Extensive atherosclerotic calcifications are present through the aorta and branch vessels without aneurysm. No significant adenopathy is present. Reproductive: Hysterectomy is noted. The ovaries are not discretely visualized and may be surgically absent as well. Other: No free fluid is present. Musculoskeletal: Degenerative anterolisthesis is present at L4-5. There slight retrolisthesis at L2-3 and L3-4. Scoliosis is convex to the left L2 and to the right at L4-5. IMPRESSION: 1. Mild distention of small bowel and stomach  suggesting ileus. No focal transition point or mass lesion is present. 2. Moderate stool throughout the colon without obstruction. 3. Extensive atherosclerotic calcifications are stable. 4. Multiple small cysts and hemangiomas of the  liver are stable. 5. Scoliosis. Electronically Signed   By: San Morelle M.D.   On: 12/02/2015 17:10     EKG: Not done in ED, will get one.   Assessment/Plan Principal Problem:   SBO (small bowel obstruction) (HCC) Active Problems:   HLD (hyperlipidemia)   COPD (chronic obstructive pulmonary disease) (HCC)   Lumbar degenerative disc disease   Protein-calorie malnutrition, severe (HCC)   Ileus (HCC)   Essential hypertension   GERD (gastroesophageal reflux disease)   Depression with anxiety   Hypokalemia   Generalized abdominal pain   SBO due to ileus: Patient's nausea, vomiting and abdominal pain are most likely caused by small bowel obstruction secondary to ileus as evidenced by the CT scan of abdomen/pelvis. Patient does not have focal transition point on CT scan. Currently hemodynamically stable. Clinically not septic.  -Place to med surg bed for obs -NPO  -prn NG tube (not started yet) -morphine prn pain -Prn Zofran prn nausea  -IVF: NS 500 cc and then NS 100 cc/h -INR/PTT/type & screen -if get worse, will call general surgeon  COPD: Patient is on 4 liter oxygen at home. She did not get breathing treatment today, and has wheezing on auscultation. -will give solumedrol 40 mg bid -Atrovent nebs and prn albuterol nebs -Mucinex for cough -Continue home Breo ellipta inhaler  HTN: -continue amlodipine, losartan -IV hydralazine when necessary  Protein-calorie malnutrition, severe ([place]): -consult to nutrition  GERD: -IV protonix  Hypokalemia: K= 3.4 on admission. - Repleted - Check Mg level  Depression and anxiety: Stable, no suicidal or homicidal ideations. -Continue home medications: Paxil and prn IV ativan  DVT ppx: SCD Code Status: Full code Family Communication: None at bed side.  Disposition Plan:  Anticipate discharge back to previous home environment Consults called:  none Admission status: medical floor/obs  Date of Service 12/02/2015     Ivor Costa [facility] Pager [phone]  If 7PM-7AM, please contact night-coverage [email or web address] Password [facility] 12/02/2015, 10:16 PM

## 2015-12-02 NOTE — ED Notes (Signed)
MD at bedside. 

## 2015-12-02 NOTE — ED Provider Notes (Signed)
CSN: [id number]     Arrival date & time 12/02/15  1305 History   First MD Initiated Contact with Patient 12/02/15 1315     Chief Complaint  Patient presents with  . Abdominal Pain     (Consider location/radiation/quality/duration/timing/severity/associated sxs/prior Treatment) HPI Comments: Patient presents with abdominal pain and vomiting. She has a history of chronic abdominal pain and constipation with prior recurrent small bowel obstructions. She had an exploratory laparotomy last year with lysis of adhesions. She reports over the last 2 weeks she's had worsening of her chronic abdominal pain. It's gradually been getting worse in the last 2 days have been thickened. She's had intermittent nausea and vomiting. Her last episode of emesis was 2 days ago. It's nonbloody and nonbilious. She does have chronic constipation and her last bowel movement was 5 days ago. She states normally she has a bowel movement every 2-3 days. However she states her abdominal pain had begun to get worse prior to her most recent episode of constipation. She denies any fevers. No urinary symptoms. She's been taking Zofran at home for the nausea which seems to improve that. She does have a history of COPD. She is oxygen dependent at 4 L/m. She states since her abdominal pain is been worsening, her breathing has also been worsening. She has bumped her oxygen up to 5 L/m. She states it's typical for her breathing to get worse when she has increased flareups of her abdominal pain.  Patient is a 75 y.o. female presenting with abdominal pain.  Abdominal Pain Associated symptoms: constipation, nausea and vomiting   Associated symptoms: no chest pain, no chills, no cough, no diarrhea, no fatigue, no fever, no hematuria and no shortness of breath     Past Medical History  Diagnosis Date  . COPD (chronic obstructive pulmonary disease) (HCC)     FVC 72%, FEV1 29%, FEV1 ratio 32% (very severe (COPD)  . Hypertension   .  Pneumonia   . DVT (deep venous thrombosis) ([place])     "LLE; years ago after a surgery"  . Pinched nerve     in back  . Peptic ulcer   . Tubular adenoma of colon 09/2011  . H/O hiatal hernia   . Anemia     as a child  . Diverticulosis   . Hyperlipidemia   . Shortness of breath dyspnea     with exertion  . Fluttering sensation of heart     pt put on metoprolol as a result  . GERD (gastroesophageal reflux disease)     PMH  . Liver lesion   . Wears glasses   . Complication of anesthesia     " My blood gas dropped during surgery so I was left on a ventilator and in ICU for 3 days."  . PONV (postoperative nausea and vomiting)   . Skipped heart beats   . On home oxygen therapy     "3L; sleep w/it; use it when I rest" (12/24/2014)  . Small bowel obstruction ([place])     "several times; OR twice" (12/24/2014)  . Headache     "maybe weekly" (12/24/2014)  . Degenerative disc disease   . Arthritis     "hands" (12/24/2014)  . Kidney stone    Past Surgical History  Procedure Laterality Date  . Abdominal hysterectomy  1987    and one ovary  . Oophorectomy  1992  . Cataract extraction w/phaco Left 06/12/2012    Dr. Boykin Reaper  . Cataract extraction w/  intraocular lens  implant, bilateral Right 06/22/2012    Dr. Lester Kinsman.   Marland Kitchen Urethral dilation    . Small intestine surgery      for a blockage, no bowel was removed, just kinked from scar tissue  . Cholecystectomy N/A 08/18/2013    Procedure: LAPAROSCOPIC CHOLECYSTECTOMY;  Surgeon: Harl Bowie, MD;  Location: [facility];  Service: General;  Laterality: N/A;  . Dilation and curettage of uterus    . Tubal ligation    . Multiple tooth extractions    . Hernia repair    . Exploratory laparotomy  12/24/2014  . Abdominal adhesion surgery  12/24/2014  . Incisional hernia repair  12/24/2014  . Laparotomy N/A 12/24/2014    Procedure: EXPLORATORY LAPAROTOMY;  Surgeon: Coralie Keens, MD;  Location: [facility];  Service: General;  Laterality: N/A;  . Lysis of  adhesion N/A 12/24/2014    Procedure: LYSIS OF ADHESION;  Surgeon: Coralie Keens, MD;  Location: [facility];  Service: General;  Laterality: N/A;  . Incisional hernia repair N/A 12/24/2014    Procedure: HERNIA REPAIR INCISIONAL;  Surgeon: Coralie Keens, MD;  Location: [facility] OR;  Service: General;  Laterality: N/A;   Family History  Problem Relation Age of Onset  . Hypertension Mother   . Ovarian cancer Mother   . Glaucoma Mother   . Colon cancer Neg Hx   . Colon polyps Neg Hx   . Diabetes Neg Hx   . Kidney disease Neg Hx   . Gallbladder disease Neg Hx   . Esophageal cancer Neg Hx   . Glaucoma Sister    Social History  Substance Use Topics  . Smoking status: Former Smoker -- 0.25 packs/day for 57 years    Types: Cigarettes    Quit date: 07/25/2015  . Smokeless tobacco: Never Used     Comment: 5 cigarettes a day  . Alcohol Use: No   OB History    No data available     Review of Systems  Constitutional: Negative for fever, chills, diaphoresis and fatigue.  HENT: Negative for congestion, rhinorrhea and sneezing.   Eyes: Negative.   Respiratory: Negative for cough, chest tightness and shortness of breath.   Cardiovascular: Negative for chest pain and leg swelling.  Gastrointestinal: Positive for nausea, vomiting, abdominal pain and constipation. Negative for diarrhea and blood in stool.  Genitourinary: Negative for frequency, hematuria, flank pain and difficulty urinating.  Musculoskeletal: Negative for back pain and arthralgias.  Skin: Negative for rash.  Neurological: Negative for dizziness, speech difficulty, weakness, numbness and headaches.      Allergies  Maxidex; Advair diskus; Trazodone and nefazodone; and Tylox  Home Medications   Prior to Admission medications   Medication Sig Start Date End Date Taking? Authorizing Provider  acetaminophen (TYLENOL) 500 MG tablet Take 500 mg by mouth every 6 (six) hours as needed for mild pain.    Historical Provider, MD   albuterol (PROVENTIL) (2.5 MG/3ML) 0.083% nebulizer solution USE 1 VIAL (3 ML) VIA NEBULIZER EVERY 6 HOURS AS NEEDED FOR WHEEZING OR SHORTNESS OF BREATH 10/31/15   [name], MD  amLODipine (NORVASC) 5 MG tablet Take 1 tablet (5 mg total) by mouth daily. 08/28/15   [name], MD  BREO ELLIPTA 100-25 MCG/INH AEPB USE 1 INHALATION DAILY 10/31/15   [name], MD  ENSURE PLUS (ENSURE PLUS) LIQD Take 2 Cans by mouth daily. For weight gain    Historical Provider, MD  Linaclotide (LINZESS) 290 MCG CAPS capsule Take 1  capsule (290 mcg total) by mouth daily. 03/08/15   [name], MD  LORazepam (ATIVAN) 1 MG tablet Take 1 tablet (1 mg total) by mouth daily as needed for anxiety or sleep. 04/25/15   [name], MD  losartan (COZAAR) 100 MG tablet Take 1 tablet (100 mg total) by mouth daily. 08/28/15   [name], MD  Multiple Vitamin (MULTIVITAMIN WITH MINERALS) TABS tablet Take 1 tablet by mouth daily. Pt uses One-A-Day brand    Historical Provider, MD  NICOTROL 10 MG inhaler Inhale 1-2 cartridges (1-2 continuous puffing total) into the lungs every 3 (three) hours as needed for smoking cessation. 06/23/15   Rigoberto Noel, MD  omeprazole (PRILOSEC) 40 MG capsule TAKE 1 CAPSULE DAILY FOR GASTROINTESTINAL ULCER 02/24/15   [name], MD  ondansetron (ZOFRAN-ODT) 4 MG disintegrating tablet TAKE 1 TABLET EVERY 8 HOURS AS NEEDED FOR NAUSEA 06/09/15   [name], MD  PARoxetine (PAXIL) 20 MG tablet Take 1 tablet (20 mg total) by mouth daily. 06/09/15   [name], MD  predniSONE (DELTASONE) 10 MG tablet 4 tabs for 2 days, then 3 tabs for 2 days, 2 tabs for 2 days, then 1 tab for 2 days, then stop 08/25/15   Melvenia Needles, NP  PROAIR HFA 108 (90 Base) MCG/ACT inhaler USE 2 TO 4 INHALATIONS EVERY 4 TO 6 HOURS AS NEEDED 10/31/15   [name], MD  TOPROL XL 25 MG 24 hr tablet TAKE 1 TABLET DAILY Patient not taking: Reported on  08/25/2015 06/09/15   [name], MD  Umeclidinium Bromide (INCRUSE ELLIPTA) 62.5 MCG/INH AEPB Inhale 1 Inhaler into the lungs daily. 04/25/15   [name], MD   BP 176/94 mmHg  Pulse 111  Temp(Src) 97.9 F (36.6 C) (Oral)  Resp 20  Ht 5' 3.5" (1.613 m)  Wt 80 lb (36.288 kg)  BMI 13.95 kg/m2  SpO2 94% Physical Exam  Constitutional: She is oriented to person, place, and time. She appears well-developed and well-nourished.  HENT:  Head: Normocephalic and atraumatic.  Eyes: Pupils are equal, round, and reactive to light.  Neck: Normal range of motion. Neck supple.  Cardiovascular: Normal rate, regular rhythm and normal heart sounds.   Pulmonary/Chest: Effort normal and breath sounds normal. No respiratory distress. She has no wheezes. She has no rales. She exhibits no tenderness.  Abdominal: Soft. Bowel sounds are normal. There is tenderness (mild diffuse tenderness). There is no rebound and no guarding.  Musculoskeletal: Normal range of motion. She exhibits no edema.  Lymphadenopathy:    She has no cervical adenopathy.  Neurological: She is alert and oriented to person, place, and time.  Skin: Skin is warm and dry. No rash noted.  Psychiatric: She has a normal mood and affect.    ED Course  Procedures (including critical care time) Labs Review Labs Reviewed  URINALYSIS, ROUTINE W REFLEX MICROSCOPIC (NOT AT [facility]) - Abnormal; Notable for the following:    Hgb urine dipstick MODERATE (*)    Ketones, ur 15 (*)    Protein, ur 100 (*)    All other components within normal limits  CBC WITH DIFFERENTIAL/PLATELET - Abnormal; Notable for the following:    Platelets 441 (*)    All other components within normal limits  COMPREHENSIVE METABOLIC PANEL - Abnormal; Notable for the following:    Potassium 3.4 (*)    Chloride 96 (*)    CO2 35 (*)    Glucose, Bld  121 (*)    Anion gap 4 (*)    All other components within normal limits  URINE MICROSCOPIC-ADD ON - Abnormal;  Notable for the following:    Squamous Epithelial / LPF 0-5 (*)    Bacteria, UA RARE (*)    All other components within normal limits  LIPASE, BLOOD    Imaging Review No results found. I have personally reviewed and evaluated these images and lab results as part of my medical decision-making.   EKG Interpretation None      MDM   Final diagnoses:  Generalized abdominal pain    Pt awaiting CT scan, care turned over to Dr. Wardell Heath.    Malvin Johns, MD 12/02/15 [phone]

## 2015-12-02 NOTE — ED Notes (Signed)
Reports chronic abdominal pain.  Increased over the last several weeks.  Reports N/V.  Hx of SBO-LBM 5 days ago.   Pt put on tramadol by PCP.

## 2015-12-02 NOTE — Progress Notes (Signed)
This is a no charge note  Transfer from [facility] per Dr. Johnney Killian  75 year old lady with past medical history of small bowel obstruction, s/p of lysis of adhesion, hypertension, hyperlipidemia, GERD, depression, peptic ulcer, kidney stone, who presents with nausea, vomiting and abdominal pain.  Patient has chronic abdominal pain, which has been worsening in the past several weeks. Patient also has nausea and vomiting. CT abdomen/pelvis showed ileus without focal transition point. Patient is hemodynamically stable. WBC 7.5, no fever. Potassium is 3.4, renal function normal, negative urinalysis, lipase 35. Did not call surgeon since pt does not have focal transition point by CT scan. NG tube was started. Patient is accepted to MedSurg bed for observation.  Ivor Costa, MD  [facility] Pager [phone]  If 7PM-7AM, please contact night-coverage [email or web address] Password [facility] 12/02/2015, 7:09 PM

## 2015-12-02 NOTE — ED Provider Notes (Signed)
Patient's care is assumed from Dr. Tamera Punt. CT scan shows ileus without obstruction. The patient continues to have nausea and abdominal pain. She has history of prior bowel obstruction. At this time plan will be for NG tube placement with hospital admission with fluids and observation. The case was reviewed with Dr. new. Patient is alert and appropriate. She has no respiratory distress. Vital signs are stable.  Charlesetta Shanks, MD 12/02/15 [phone]

## 2015-12-02 NOTE — ED Notes (Signed)
Report given to 6N RN. 

## 2015-12-03 ENCOUNTER — Encounter (HOSPITAL_COMMUNITY): Payer: Self-pay | Admitting: *Deleted

## 2015-12-03 ENCOUNTER — Observation Stay (HOSPITAL_COMMUNITY): Payer: Medicare Other

## 2015-12-03 DIAGNOSIS — K5669 Other intestinal obstruction: Secondary | ICD-10-CM | POA: Diagnosis not present

## 2015-12-03 DIAGNOSIS — Z681 Body mass index (BMI) 19 or less, adult: Secondary | ICD-10-CM | POA: Diagnosis not present

## 2015-12-03 DIAGNOSIS — E43 Unspecified severe protein-calorie malnutrition: Secondary | ICD-10-CM | POA: Diagnosis not present

## 2015-12-03 DIAGNOSIS — E876 Hypokalemia: Secondary | ICD-10-CM | POA: Diagnosis present

## 2015-12-03 DIAGNOSIS — Z8711 Personal history of peptic ulcer disease: Secondary | ICD-10-CM | POA: Diagnosis not present

## 2015-12-03 DIAGNOSIS — E785 Hyperlipidemia, unspecified: Secondary | ICD-10-CM | POA: Diagnosis present

## 2015-12-03 DIAGNOSIS — Z885 Allergy status to narcotic agent status: Secondary | ICD-10-CM | POA: Diagnosis not present

## 2015-12-03 DIAGNOSIS — Z87442 Personal history of urinary calculi: Secondary | ICD-10-CM | POA: Diagnosis not present

## 2015-12-03 DIAGNOSIS — Z9842 Cataract extraction status, left eye: Secondary | ICD-10-CM | POA: Diagnosis not present

## 2015-12-03 DIAGNOSIS — J438 Other emphysema: Secondary | ICD-10-CM | POA: Diagnosis not present

## 2015-12-03 DIAGNOSIS — J441 Chronic obstructive pulmonary disease with (acute) exacerbation: Secondary | ICD-10-CM | POA: Diagnosis not present

## 2015-12-03 DIAGNOSIS — Z7951 Long term (current) use of inhaled steroids: Secondary | ICD-10-CM | POA: Diagnosis not present

## 2015-12-03 DIAGNOSIS — K219 Gastro-esophageal reflux disease without esophagitis: Secondary | ICD-10-CM | POA: Diagnosis present

## 2015-12-03 DIAGNOSIS — Z9841 Cataract extraction status, right eye: Secondary | ICD-10-CM | POA: Diagnosis not present

## 2015-12-03 DIAGNOSIS — R1030 Lower abdominal pain, unspecified: Secondary | ICD-10-CM | POA: Diagnosis not present

## 2015-12-03 DIAGNOSIS — G8929 Other chronic pain: Secondary | ICD-10-CM | POA: Diagnosis present

## 2015-12-03 DIAGNOSIS — Z90710 Acquired absence of both cervix and uterus: Secondary | ICD-10-CM | POA: Diagnosis not present

## 2015-12-03 DIAGNOSIS — F418 Other specified anxiety disorders: Secondary | ICD-10-CM | POA: Diagnosis not present

## 2015-12-03 DIAGNOSIS — Z888 Allergy status to other drugs, medicaments and biological substances status: Secondary | ICD-10-CM | POA: Diagnosis not present

## 2015-12-03 DIAGNOSIS — Z86718 Personal history of other venous thrombosis and embolism: Secondary | ICD-10-CM | POA: Diagnosis not present

## 2015-12-03 DIAGNOSIS — I10 Essential (primary) hypertension: Secondary | ICD-10-CM | POA: Diagnosis not present

## 2015-12-03 DIAGNOSIS — Z9981 Dependence on supplemental oxygen: Secondary | ICD-10-CM | POA: Diagnosis not present

## 2015-12-03 DIAGNOSIS — Z79899 Other long term (current) drug therapy: Secondary | ICD-10-CM | POA: Diagnosis not present

## 2015-12-03 DIAGNOSIS — Z7952 Long term (current) use of systemic steroids: Secondary | ICD-10-CM | POA: Diagnosis not present

## 2015-12-03 DIAGNOSIS — Z87891 Personal history of nicotine dependence: Secondary | ICD-10-CM | POA: Diagnosis not present

## 2015-12-03 DIAGNOSIS — K567 Ileus, unspecified: Secondary | ICD-10-CM | POA: Diagnosis not present

## 2015-12-03 LAB — CBC
HEMATOCRIT: 37 % (ref 36.0–46.0)
Hemoglobin: 12.2 g/dL (ref 12.0–15.0)
MCH: 29.8 pg (ref 26.0–34.0)
MCHC: 33 g/dL (ref 30.0–36.0)
MCV: 90.5 fL (ref 78.0–100.0)
Platelets: 372 10*3/uL (ref 150–400)
RBC: 4.09 MIL/uL (ref 3.87–5.11)
RDW: 14.1 % (ref 11.5–15.5)
WBC: 9.1 10*3/uL (ref 4.0–10.5)

## 2015-12-03 LAB — MAGNESIUM: Magnesium: 1.5 mg/dL — ABNORMAL LOW (ref 1.7–2.4)

## 2015-12-03 LAB — BASIC METABOLIC PANEL
Anion gap: 13 (ref 5–15)
BUN: 9 mg/dL (ref 6–20)
CHLORIDE: 98 mmol/L — AB (ref 101–111)
CO2: 30 mmol/L (ref 22–32)
CREATININE: 0.78 mg/dL (ref 0.44–1.00)
Calcium: 9.1 mg/dL (ref 8.9–10.3)
GFR calc Af Amer: >60 mL/min (ref >60)
GLUCOSE: 112 mg/dL — AB (ref 65–99)
POTASSIUM: 4 mmol/L (ref 3.5–5.1)
Sodium: 141 mmol/L (ref 135–145)

## 2015-12-03 LAB — TYPE AND SCREEN
ABO/RH(D): O POS
Antibody Screen: NEGATIVE

## 2015-12-03 LAB — GLUCOSE, CAPILLARY: Glucose-Capillary: 108 mg/dL — ABNORMAL HIGH (ref 65–99)

## 2015-12-03 LAB — ABO/RH: ABO/RH(D): O POS

## 2015-12-03 MED ORDER — POLYETHYLENE GLYCOL 3350 17 G PO PACK
17.0000 g | PACK | Freq: Every day | ORAL | Status: DC
  Administered 2015-12-03 – 2015-12-06 (×4): 17 g via ORAL
  Filled 2015-12-03 (×4): qty 1

## 2015-12-03 MED ORDER — SENNOSIDES 8.8 MG/5ML PO SYRP
10.0000 mL | ORAL_SOLUTION | Freq: Two times a day (BID) | ORAL | Status: DC
  Administered 2015-12-03 – 2015-12-06 (×4): 10 mL via ORAL
  Filled 2015-12-03 (×7): qty 10

## 2015-12-03 MED ORDER — BOOST / RESOURCE BREEZE PO LIQD
1.0000 | Freq: Two times a day (BID) | ORAL | Status: DC
  Administered 2015-12-04 – 2015-12-06 (×5): 1 via ORAL

## 2015-12-03 MED ORDER — ONDANSETRON HCL 4 MG/2ML IJ SOLN
4.0000 mg | Freq: Four times a day (QID) | INTRAMUSCULAR | Status: DC | PRN
  Administered 2015-12-03 – 2015-12-04 (×3): 4 mg via INTRAVENOUS
  Filled 2015-12-03 (×2): qty 2

## 2015-12-03 MED ORDER — PROCHLORPERAZINE EDISYLATE 5 MG/ML IJ SOLN
10.0000 mg | Freq: Once | INTRAMUSCULAR | Status: AC
  Administered 2015-12-03: 10 mg via INTRAVENOUS
  Filled 2015-12-03: qty 2

## 2015-12-03 MED ORDER — METHYLPREDNISOLONE SODIUM SUCC 40 MG IJ SOLR
40.0000 mg | Freq: Every day | INTRAMUSCULAR | Status: DC
  Administered 2015-12-03 – 2015-12-04 (×2): 40 mg via INTRAVENOUS
  Filled 2015-12-03 (×2): qty 1

## 2015-12-03 MED ORDER — CETYLPYRIDINIUM CHLORIDE 0.05 % MT LIQD
7.0000 mL | Freq: Two times a day (BID) | OROMUCOSAL | Status: DC
  Administered 2015-12-03 – 2015-12-04 (×4): 7 mL via OROMUCOSAL

## 2015-12-03 MED ORDER — MAGNESIUM SULFATE 2 GM/50ML IV SOLN
2.0000 g | Freq: Once | INTRAVENOUS | Status: AC
  Administered 2015-12-03: 2 g via INTRAVENOUS
  Filled 2015-12-03: qty 50

## 2015-12-03 MED ORDER — BISACODYL 10 MG RE SUPP
10.0000 mg | Freq: Once | RECTAL | Status: AC
Start: 2015-12-03 — End: 2015-12-03
  Administered 2015-12-03: 10 mg via RECTAL
  Filled 2015-12-03: qty 1

## 2015-12-03 NOTE — Progress Notes (Signed)
Physical Therapy Evaluation & Discharge Patient Details Name: Chiamaka Latka MRN: [id number] DOB: October 04, 1940 Today's Date: 12/03/2015   History of Present Illness  75 y.o. female admitted for abdominal pain and vomiting. PMH significant for chronic abdominal pain and constipation with recurrent small bowel obstructions, COPD (on 4L [place] at home), HTN, DVT, pneumonia, HLD, dyspnea, GERD, degenerative disc disease, and arthritis in hands.   Clinical Impression  Patient presents with medical issues that are not currently significantly affecting mobility.  Patient presents with MOD I mobility, (limited gait due to suction via NG tube), Berg Balance 53/56 = low fall risk.  Patient reports pain and nausea are getting better now that she is starting to have bowel movements.  No skilled PT needs noted, will DC PT services.     Follow Up Recommendations No PT follow up    Equipment Recommendations  None recommended by PT    Recommendations for Other Services       Precautions / Restrictions Precautions Precautions: Fall Precaution Comments: Suction NG tube in place. Restrictions Weight Bearing Restrictions: No      Mobility  Bed Mobility Overal bed mobility: Modified Independent             General bed mobility comments: HOB slightly elevated, no use of bedrails. Increased time.  Transfers Overall transfer level: Modified independent Equipment used: None Transfers: Sit to/from [facility] Sit to Stand: Modified independent (Device/Increase time) Stand pivot transfers: Modified independent (Device/Increase time)       General transfer comment: Supervision for safety due to increased weakness and fatigue.  Ambulation/Gait Ambulation/Gait assistance: Supervision Ambulation Distance (Feet):  (Marching in place, due to NG tube suction.) Assistive device: None       General Gait Details: Patient is home level ambulator mainly, on supplemental O2 at  home.  Stairs            Wheelchair Mobility    Modified Rankin (Stroke Patients Only)       Balance Overall balance assessment: Needs assistance Sitting-balance support: No upper extremity supported;Feet supported Sitting balance-Leahy Scale: Normal     Standing balance support: No upper extremity supported;During functional activity Standing balance-Leahy Scale: Good                   Standardized Balance Assessment Standardized Balance Assessment : Berg Balance Test Berg Balance Test Sit to Stand: Able to stand without using hands and stabilize independently Standing Unsupported: Able to stand safely 2 minutes Sitting with Back Unsupported but Feet Supported on Floor or Stool: Able to sit safely and securely 2 minutes Stand to Sit: Sits safely with minimal use of hands Transfers: Able to transfer safely, minor use of hands Standing Unsupported with Eyes Closed: Able to stand 10 seconds safely Standing Ubsupported with Feet Together: Able to place feet together independently and stand 1 minute safely From Standing, Reach Forward with Outstretched Arm: Can reach confidently >25 cm (10") From Standing Position, Pick up Object from Floor: Able to pick up shoe safely and easily From Standing Position, Turn to Look Behind Over each Shoulder: Looks behind from both sides and weight shifts well Turn 360 Degrees: Able to turn 360 degrees safely one side only in 4 seconds or less Standing Unsupported, Alternately Place Feet on Step/Stool: Able to stand independently and complete 8 steps >20 seconds Standing Unsupported, One Foot in Front: Able to place foot tandem independently and hold 30 seconds Standing on One Leg: Able to lift leg independently and hold  5-10 seconds Total Score: 53         Pertinent Vitals/Pain Pain Assessment: 0-10 Pain Score: 6  Pain Location: stomach Pain Descriptors / Indicators: Cramping (Reports "tolerable, much better than it was") Pain  Intervention(s): Monitored during session    [place] expects to be discharged to:: Private residence Living Arrangements: Spouse/significant other;Children Available Help at Discharge: Family;Available 24 hours/day Type of Home: House Home Access: Level entry     Home Layout: One level Home Equipment: Walker - 2 wheels;Shower seat;Grab bars - tub/shower;Hand held shower head Additional Comments: Pt's husband had a stroke and she reports that he has memory deficits and often becomes confused.    Prior Function Level of Independence: Independent         Comments: Does not use AD, home level mobility, daughter drives and completes errands      Hand Dominance   Dominant Hand: Right    Extremity/Trunk Assessment   Upper Extremity Assessment: Overall WFL for tasks assessed           Lower Extremity Assessment: Generalized weakness;Overall [facility] for tasks assessed      Cervical / Trunk Assessment: Kyphotic  Communication   Communication: No difficulties  Cognition Arousal/Alertness: Awake/alert Behavior During Therapy: WFL for tasks assessed/performed Overall Cognitive Status: Within Functional Limits for tasks assessed                      General Comments      Exercises        Assessment/Plan    PT Assessment Patent does not need any further PT services  PT Diagnosis Generalized weakness   PT Problem List    PT Treatment Interventions     PT Goals (Current goals can be found in the Care Plan section) Acute Rehab PT Goals Patient Stated Goal: to get better and go home    Frequency     Barriers to discharge        Co-evaluation               End of Session Equipment Utilized During Treatment: Gait belt Activity Tolerance: Patient tolerated treatment well Patient left: in bed;with call bell/phone within reach Nurse Communication: Mobility status         Time: 0100-7121 PT Time Calculation (min) (ACUTE ONLY):  15 min   Charges:   PT Evaluation $PT Eval Low Complexity: 1 Procedure     PT G CodesZenia Resides, Traye Bates L 12/25/15, 3:23 PM

## 2015-12-03 NOTE — Progress Notes (Signed)
Initial Nutrition Assessment  DOCUMENTATION CODES:  Underweight, Severe malnutrition in context of chronic illness   Pt meets criteria for SEVERE MALNUTRITION in the context of Chronic illness as evidenced by Severe muscle/fat wasting.   INTERVENTION:  Education on weight gain/maintenance with diet  While on CL Boost Breeze po BID, each supplement provides 250 kcal and 9 grams of protein. Change to enlive once diet advanced  NUTRITION DIAGNOSIS:  Increased nutrient needs related to catabolic illness (COPD) as evidenced by estimated nutritional requirements for wt gain/this condition  GOAL:  Patient will meet greater than or equal to 90% of their needs  MONITOR:  Diet advancement, Labs, I & O's  REASON FOR ASSESSMENT:  Consult Assessment of nutrition requirement/status  ASSESSMENT:  75 y/o female PMhx: SBO (2x surgery), COPD on 4l oxygen, HTN, HLD, GERD, Depression. Presents w/ nausea vomiting, abdominal pain which has been progressively worsening in past several weeks. CT reveals ileus. NG tube placed. PLan for conservative management.    Pt currently NPO with large amount of NGT output. 800cc so far. She says that for the last two weeks she has had progressively more trouble tolerating her food. She would eat and then feel sick afterwards. Towards the later end of this interval, she would throw up with intake.   Normally, pt does not follow any sort of diet and admits to eating mostly "junk food" and sweets. She consumes a large amount of fluids, including 2 Ensures and 2 bottles of propel each day. She does not endorse any shortness of breath with eating. She has chronic constipation. She takes a stool softener or a miralax, when she can tolerate it, and this will help. She took a MVI daily  Told pt that because of her COPD and increased WOB, she burns an ancessive amount of calories daily. RD discussed what foods she should prioritize in order to maintain weight ie foods with high  fat/protein content. Reccommended cheese, PB, eggs, milkshakes, ice cream, creamer in her coffee. Encouraged to eat as much and as often as possible   Currently NPO. When advanced to clears add Breeze and then [facility] on full liquids  NFPE: Severe fat and muscle wasting, can count her ribs and is devoid of almost any tricep muscle/fat.   Labs reviewed:   Recent Labs Lab 12/02/15 1345 12/03/15 0450  NA 135 141  K 3.4* 4.0  CL 96* 98*  CO2 35* 30  BUN 8 9  CREATININE 0.60 0.78  CALCIUM 9.0 9.1  MG  --  1.5*  GLUCOSE 121* 112*   Diet Order:  Diet NPO time specified Except for: Sips with Meds  Skin:  Reviewed, no issues  Last BM:  11/29/15  Height:  Ht Readings from Last 1 Encounters:  12/02/15 5' 3.5" (1.613 m)   Weight:  Wt Readings from Last 1 Encounters:  12/02/15 82 lb (37.195 kg)   Wt Readings from Last 10 Encounters:  12/02/15 82 lb (37.195 kg)  08/25/15 83 lb (37.649 kg)  06/23/15 85 lb (38.556 kg)  04/25/15 86 lb (39.009 kg)  04/08/15 87 lb (39.463 kg)  03/31/15 87 lb 4.8 oz (39.599 kg)  03/08/15 87 lb (39.463 kg)  03/06/15 84 lb (38.102 kg)  02/22/15 88 lb (39.917 kg)  01/25/15 86 lb (39.009 kg)   Ideal Body Weight:  53 kg  BMI:  Body mass index is 14.3 kg/(m^2).  Estimated Nutritional Needs:  Kcal:  1250-1450 (35-40 kcal/kg bw) Protein:  47-58 g (1.3-1.5 g/kg bw)  Fluid:  >1.4 liters  EDUCATION NEEDS:  Education needs addressed  Burtis Junes RD, LDN Clinical Nutrition Pager: [phone] 12/03/2015 4:42 PM

## 2015-12-03 NOTE — Progress Notes (Signed)
Patient ID: Tamara Adams, female   DOB: [birth year], 75 y.o.   MRN: [id number]    PROGRESS NOTE    Tamara Adams  ASN:[id number] DOB: 1941-04-08 DOA: 12/02/2015  PCP: Beatrice Lecher, MD   Brief Narrative:  75 y.o. female with history of small bowel obstruction (s/p surgery x 2), COPD on 4 L of oxygen, hypertension, hyperlipidemia, presented to [facility] with main concern of progressively worsening abd pain with poor oral intake, several episodes of non bloody vomiting. In ED, CT abdomen/pelvis showed ileus without focal transition point. NG tube was started in ED.  Assessment & Plan:  Nausea and vomiting with abd pain secondary to ileus, constipation  - reports feeling better this AM - NGT in place, still with significant brownish drainage - no abd pain or tenderness on exam - continue with conservative management for now - abd xray requested for follow up evaluation - provide antiemetics and analgesia as needed, IVF - keep NPO - discuss with surgery team on call  - give miralax and senokot   Acute COPD exacerbation  - Patient is on 4 liter oxygen at home - was started on solumedrol 40 mg IV BID - taper down to 40 mg QD today and change to PO prednisone in next 48 hours  - Atrovent nebs and prn albuterol nebs - Mucinex for cough - Continue home Breo ellipta inhaler  HTN, essential  - continue amlodipine, losartan - IV hydralazine when necessary  Protein-calorie malnutrition, severe ([place]): - consult to nutrition - underweight, Body mass index is 14.3 kg/(m^2).  GERD - IV protonix  Hypokalemia - with low Mg, supplement Mg - K is WNL - keep K ~4 and Mg ~2 - BMP and Mg level in AM  Depression and anxiety - continue home medications: Paxil and prn IV ativan  DVT prophylaxis: SCD's Code Status: Full  Family Communication: Patient at bedside  Disposition Plan: Home once ileus resolved, possibly early next week    Consultants:   None  Procedures:    None  Antimicrobials:   None  Subjective: Reports feeling better but tired. No chest pain or shortness of breath.   Objective: Filed Vitals:   12/02/15 1752 12/02/15 1946 12/02/15 2126 12/03/15 0552  BP: 148/57 165/61 143/50 135/56  Pulse: 88 85 81 86  Temp:   98.2 F (36.8 C) 98.6 F (37 C)  TempSrc:   Oral Oral  Resp: '18 18 19 20  '$ Height:   5' 3.5" (1.613 m)   Weight:   37.195 kg (82 lb)   SpO2: 100% 99% 100% 99%    Intake/Output Summary (Last 24 hours) at 12/03/15 0918 Last data filed at 12/03/15 0600  Gross per 24 hour  Intake    600 ml  Output    950 ml  Net   -350 ml   Filed Weights   12/02/15 1310 12/02/15 2126  Weight: 36.288 kg (80 lb) 37.195 kg (82 lb)    Examination:  General exam: Appears calm and comfortable  Respiratory system: Clear to auscultation. Respiratory effort normal. Cardiovascular system: S1 & S2 heard, RRR. No JVD, rubs, gallops or clicks. No pedal edema. Gastrointestinal system: Abdomen is slightly distended, soft and nontender. NGT in place with significant brownish drainage  Central nervous system: Alert and oriented. No focal neurological deficits.  Data Reviewed: I have personally reviewed following labs and imaging studies  CBC:  Recent Labs Lab 12/02/15 1345 12/03/15 0450  WBC 7.5 9.1  NEUTROABS 5.2  --   HGB 12.3  12.2  HCT 37.4 37.0  MCV 89.0 90.5  PLT 441* 258   Basic Metabolic Panel:  Recent Labs Lab 12/02/15 1345 12/03/15 0450  NA 135 141  K 3.4* 4.0  CL 96* 98*  CO2 35* 30  GLUCOSE 121* 112*  BUN 8 9  CREATININE 0.60 0.78  CALCIUM 9.0 9.1  MG  --  1.5*   Liver Function Tests:  Recent Labs Lab 12/02/15 1345  AST 30  ALT 17  ALKPHOS 80  BILITOT 0.3  PROT 6.6  ALBUMIN 3.5    Recent Labs Lab 12/02/15 1345  LIPASE 35   Coagulation Profile:  Recent Labs Lab 12/02/15 2302  INR 1.18   CBG:  Recent Labs Lab 12/03/15 0744  GLUCAP 108*   Urine analysis:    Component Value  Date/Time   COLORURINE YELLOW 12/02/2015 [place] 12/02/2015 1328   LABSPEC 1.012 12/02/2015 1328   PHURINE 7.0 12/02/2015 1328   GLUCOSEU NEGATIVE 12/02/2015 1328   HGBUR MODERATE* 12/02/2015 1328   BILIRUBINUR NEGATIVE 12/02/2015 1328   BILIRUBINUR NEG 02/12/2014 1207   KETONESUR 15* 12/02/2015 1328   PROTEINUR 100* 12/02/2015 1328   PROTEINUR NEG 02/12/2014 1207   UROBILINOGEN 0.2 03/06/2015 2133   UROBILINOGEN 0.2 02/12/2014 1207   NITRITE NEGATIVE 12/02/2015 1328   NITRITE NEG 02/12/2014 1207   LEUKOCYTESUR NEGATIVE 12/02/2015 1328    Radiology Studies: Ct Abdomen Pelvis W Contrast 12/02/2015  Mild distention of small bowel and stomach suggesting ileus. No focal transition point or mass lesion is present. 2. Moderate stool throughout the colon without obstruction. 3. Extensive atherosclerotic calcifications are stable. 4. Multiple small cysts and hemangiomas of the liver are stable.   Scheduled Meds: . amLODipine  5 mg Oral Daily  . antiseptic oral rinse  7 mL Mouth Rinse BID  . fluticasone furoate-vilanterol  1 puff Inhalation Daily  . linaclotide  290 mcg Oral QAC breakfast  . losartan  100 mg Oral Daily  . methylPREDNISolone (SOLU-MEDROL) injection  40 mg Intravenous Q12H  . multivitamin with minerals  1 tablet Oral Daily  . pantoprazole (PROTONIX) IV  40 mg Intravenous Q24H  . PARoxetine  20 mg Oral Daily  . sodium chloride flush  3 mL Intravenous Q12H   Continuous Infusions: . sodium chloride 100 mL/hr at 12/02/15 2246     LOS: 1 day   Time spent: 20 minutes   Faye Ramsay, MD [facility] Pager [phone]  If 7PM-7AM, please contact night-coverage [email or web address] Password [email or web address] 12/03/2015, 9:18 AM

## 2015-12-03 NOTE — Progress Notes (Signed)
Occupational Therapy Evaluation/Discharge Patient Details Name: Tamara Adams MRN: [id number] DOB: [birth year] Today's Date: 12/03/2015    History of Present Illness 75 y.o. female admitted for abdominal pain and vomiting. PMH significant for chronic abdominal pain and constipation with recurrent small bowel obstructions, COPD (on 4L [place] at home), HTN, DVT, pneumonia, HLD, dyspnea, GERD, degenerative disc disease, and arthritis in hands.    Clinical Impression   Patient has been evaluated by Occupational Therapy with no acute OT needs identified. Pt completed all self-care tasks at supervision level for safety due to increased weakness and fatigue. Pt having frequent BM and requested to lay in bed near [facility] - encouraged to sit up in chair this afternoon and to use incentive spirometer. Pt plans to d/c home with 24/7 assistance from her son and granddaughter. All education has been completed and pt has no further questions. Pt with no further acute OT needs. OT signing off.    Follow Up Recommendations  No OT follow up;Supervision - Intermittent    Equipment Recommendations  None recommended by OT    Recommendations for Other Services       Precautions / Restrictions Precautions Precautions: Fall Restrictions Weight Bearing Restrictions: No      Mobility Bed Mobility Overal bed mobility: Modified Independent             General bed mobility comments: HOB slightly elevated, no use of bedrails. Increased time.  Transfers Overall transfer level: Needs assistance Equipment used: None Transfers: Sit to/from Stand Sit to Stand: Supervision         General transfer comment: Supervision for safety due to increased weakness and fatigue.    Balance Overall balance assessment: Needs assistance Sitting-balance support: No upper extremity supported;Feet supported Sitting balance-Leahy Scale: Normal     Standing balance support: No upper extremity supported;During functional  activity Standing balance-Leahy Scale: Good                              ADL Overall ADL's : Needs assistance/impaired     Grooming: Wash/dry hands;Set up;Standing   Upper Body Bathing: Supervision/ safety;Sitting   Lower Body Bathing: Supervison/ safety;Sit to/from stand   Upper Body Dressing : Supervision/safety;Sitting   Lower Body Dressing: Supervision/safety;Sit to/from stand   Toilet Transfer: Supervision/safety;Ambulation;BSC Toilet Transfer Details (indicate cue type and reason): BSC next to bed due to frequent BM from NG tube Toileting- Clothing Manipulation and Hygiene: Supervision/safety;Sit to/from stand       Functional mobility during ADLs: Supervision/safety General ADL Comments: Supervision for all ADLs and mobility due to weakness. Encouraged to use incentive spirometer. No family present for OT eval.     Vision Vision Assessment?: No apparent visual deficits   Perception     Praxis      Pertinent Vitals/Pain Pain Assessment: No/denies pain     Hand Dominance Right   Extremity/Trunk Assessment Upper Extremity Assessment Upper Extremity Assessment: Overall WFL for tasks assessed   Lower Extremity Assessment Lower Extremity Assessment: Generalized weakness   Cervical / Trunk Assessment Cervical / Trunk Assessment: Kyphotic   Communication Communication Communication: No difficulties   Cognition Arousal/Alertness: Awake/alert Behavior During Therapy: WFL for tasks assessed/performed Overall Cognitive Status: Within Functional Limits for tasks assessed                     General Comments       Exercises       Shoulder Instructions  Home Living Family/patient expects to be discharged to:: Private residence Living Arrangements: Spouse/significant other;Children Available Help at Discharge: Family;Available 24 hours/day Type of Home: House Home Access: Level entry     Home Layout: One level     Bathroom  Shower/Tub: Tub/shower unit Shower/tub characteristics: Curtain Biochemist, clinical: Handicapped height     Home Equipment: Environmental consultant - 2 wheels;Shower seat;Grab bars - tub/shower;Hand held shower head   Additional Comments: Pt's husband had a stroke and she reports that he has memory deficits and often becomes confused.      Prior Functioning/Environment Level of Independence: Independent        Comments: Does not use AD, indpendent with all ADLs and completes some light IADLs.    OT Diagnosis: Generalized weakness   OT Problem List: Decreased strength;Impaired balance (sitting and/or standing);Decreased activity tolerance   OT Treatment/Interventions:      OT Goals(Current goals can be found in the care plan section) Acute Rehab OT Goals Patient Stated Goal: to get better and go home OT Goal Formulation: With patient Time For Goal Achievement: 12/17/15 Potential to Achieve Goals: Good  OT Frequency:     Barriers to D/C:            Co-evaluation              End of Session Equipment Utilized During Treatment: Gait belt;Oxygen Nurse Communication: Mobility status  Activity Tolerance: Patient limited by fatigue Patient left: in bed;with call bell/phone within reach   Time: 1352-1407 OT Time Calculation (min): 15 min Charges:  OT General Charges $OT Visit: 1 Procedure OT Evaluation $OT Eval Low Complexity: 1 Procedure G-Codes: OT G-codes **NOT FOR INPATIENT CLASS** Functional Assessment Tool Used: clinical judgement Functional Limitation: Self care Self Care Current Status (B5208): At least 1 percent but less than 20 percent impaired, limited or restricted Self Care Goal Status (Y2233): At least 1 percent but less than 20 percent impaired, limited or restricted Self Care Discharge Status [phone]): At least 1 percent but less than 20 percent impaired, limited or restricted  Redmond Baseman, OTR/L Pager: [phone] 12/03/2015, 2:21 PM

## 2015-12-04 DIAGNOSIS — J438 Other emphysema: Secondary | ICD-10-CM

## 2015-12-04 LAB — CBC
HCT: 37.3 % (ref 36.0–46.0)
HEMOGLOBIN: 11.6 g/dL — AB (ref 12.0–15.0)
MCH: 28.3 pg (ref 26.0–34.0)
MCHC: 31.1 g/dL (ref 30.0–36.0)
MCV: 91 fL (ref 78.0–100.0)
PLATELETS: 403 10*3/uL — AB (ref 150–400)
RBC: 4.1 MIL/uL (ref 3.87–5.11)
RDW: 14.2 % (ref 11.5–15.5)
WBC: 17.3 10*3/uL — ABNORMAL HIGH (ref 4.0–10.5)

## 2015-12-04 LAB — GLUCOSE, CAPILLARY: GLUCOSE-CAPILLARY: 100 mg/dL — AB (ref 65–99)

## 2015-12-04 LAB — BASIC METABOLIC PANEL
Anion gap: 14 (ref 5–15)
BUN: 8 mg/dL (ref 6–20)
CHLORIDE: 99 mmol/L — AB (ref 101–111)
CO2: 28 mmol/L (ref 22–32)
CREATININE: 0.61 mg/dL (ref 0.44–1.00)
Calcium: 8.8 mg/dL — ABNORMAL LOW (ref 8.9–10.3)
Glucose, Bld: 113 mg/dL — ABNORMAL HIGH (ref 65–99)
POTASSIUM: 2.9 mmol/L — AB (ref 3.5–5.1)
SODIUM: 141 mmol/L (ref 135–145)

## 2015-12-04 LAB — MAGNESIUM: MAGNESIUM: 1.6 mg/dL — AB (ref 1.7–2.4)

## 2015-12-04 MED ORDER — POTASSIUM CHLORIDE 10 MEQ/100ML IV SOLN
10.0000 meq | INTRAVENOUS | Status: AC
  Administered 2015-12-04 (×4): 10 meq via INTRAVENOUS
  Filled 2015-12-04 (×5): qty 100

## 2015-12-04 MED ORDER — MAGNESIUM SULFATE 2 GM/50ML IV SOLN
2.0000 g | Freq: Once | INTRAVENOUS | Status: AC
  Administered 2015-12-04: 2 g via INTRAVENOUS
  Filled 2015-12-04: qty 50

## 2015-12-04 MED ORDER — ONDANSETRON HCL 4 MG/2ML IJ SOLN
4.0000 mg | INTRAMUSCULAR | Status: DC | PRN
  Administered 2015-12-04: 4 mg via INTRAVENOUS
  Filled 2015-12-04: qty 2

## 2015-12-04 MED ORDER — TRAMADOL HCL 50 MG PO TABS
50.0000 mg | ORAL_TABLET | Freq: Four times a day (QID) | ORAL | Status: DC | PRN
  Administered 2015-12-05 – 2015-12-06 (×2): 50 mg via ORAL
  Filled 2015-12-04 (×3): qty 1

## 2015-12-04 MED ORDER — ONDANSETRON HCL 4 MG/2ML IJ SOLN
4.0000 mg | Freq: Four times a day (QID) | INTRAMUSCULAR | Status: DC | PRN
  Filled 2015-12-04: qty 2

## 2015-12-04 MED ORDER — METHYLPREDNISOLONE SODIUM SUCC 40 MG IJ SOLR
20.0000 mg | Freq: Every day | INTRAMUSCULAR | Status: DC
  Administered 2015-12-05: 20 mg via INTRAVENOUS
  Filled 2015-12-04: qty 1

## 2015-12-04 NOTE — Progress Notes (Signed)
Patient ID: Tamara Adams, female   DOB: 1941/06/10, 75 y.o.   MRN: [id number]    PROGRESS NOTE    Abelina Ketron  MVH:[id number] DOB: [birth year] DOA: 12/02/2015  PCP: Beatrice Lecher, MD   Brief Narrative:  75 y.o. female with history of small bowel obstruction (s/p surgery x 2), COPD on 4 L of oxygen, hypertension, hyperlipidemia, presented to [facility] with main concern of progressively worsening abd pain with poor oral intake, several episodes of non bloody vomiting. In ED, CT abdomen/pelvis showed ileus without focal transition point. NG tube was started in ED.  Assessment & Plan:  Nausea and vomiting with abd pain secondary to ileus, constipation  - reports feeling better this AM - NGT in place, still with drainage but less - no abd pain or tenderness on exam - continue with conservative management for now - had BM this AM, continue with bowel regimen  - provide antiemetics and analgesia as needed, IVF - clamp tube and try clears this afternoon   Acute COPD exacerbation  - Patient is on 4 liter oxygen at home - was started on solumedrol 40 mg IV BID, taper down  - Atrovent nebs and prn albuterol nebs - Mucinex for cough - Continue home Breo ellipta inhaler  HTN, essential  - continue amlodipine, losartan - IV hydralazine when necessary  Protein-calorie malnutrition, severe ([place]): - consult to nutrition - underweight, Body mass index is 14.3 kg/(m^2).  GERD - IV protonix  Hypokalemia - with low Mg, continue to supplement Mg - keep K ~4 and Mg ~2 - BMP and Mg level in AM  Depression and anxiety - continue home medications: Paxil and prn IV ativan  DVT prophylaxis: SCD's Code Status: Full  Family Communication: Patient at bedside  Disposition Plan: Home once ileus resolved, possibly early next week    Consultants:   None  Procedures:   None  Antimicrobials:   None  Subjective: Reports feeling better but still with nausea.   Objective: Filed Vitals:   12/03/15 2141 12/04/15 0452 12/04/15 0550 12/04/15 0931  BP: 169/94 182/71 164/54   Pulse: 90 98 96   Temp: 98.3 F (36.8 C) 98 F (36.7 C)    TempSrc: Oral Oral    Resp: 19 20    Height:      Weight:      SpO2: 95% 94%  95%    Intake/Output Summary (Last 24 hours) at 12/04/15 1111 Last data filed at 12/04/15 0948  Gross per 24 hour  Intake 1819.17 ml  Output   1625 ml  Net 194.17 ml   Filed Weights   12/02/15 1310 12/02/15 2126  Weight: 36.288 kg (80 lb) 37.195 kg (82 lb)    Examination:  General exam: Appears calm and comfortable, cachectic  Respiratory system: Clear to auscultation. Respiratory effort normal. Cardiovascular system: S1 & S2 heard, RRR. No JVD, rubs, gallops or clicks. No pedal edema. Gastrointestinal system: Abdomen is soft and nontender. NGT in place with significant brownish drainage  Central nervous system: Alert and oriented. No focal neurological deficits.  Data Reviewed: I have personally reviewed following labs and imaging studies  CBC:  Recent Labs Lab 12/02/15 1345 12/03/15 0450 12/04/15 0637  WBC 7.5 9.1 17.3*  NEUTROABS 5.2  --   --   HGB 12.3 12.2 11.6*  HCT 37.4 37.0 37.3  MCV 89.0 90.5 91.0  PLT 441* 372 841*   Basic Metabolic Panel:  Recent Labs Lab 12/02/15 1345 12/03/15 0450 12/04/15 0637  NA 135 141  141  K 3.4* 4.0 2.9*  CL 96* 98* 99*  CO2 35* 30 28  GLUCOSE 121* 112* 113*  BUN '8 9 8  '$ CREATININE 0.60 0.78 0.61  CALCIUM 9.0 9.1 8.8*  MG  --  1.5* 1.6*   Liver Function Tests:  Recent Labs Lab 12/02/15 1345  AST 30  ALT 17  ALKPHOS 80  BILITOT 0.3  PROT 6.6  ALBUMIN 3.5    Recent Labs Lab 12/02/15 1345  LIPASE 35   Coagulation Profile:  Recent Labs Lab 12/02/15 2302  INR 1.18   CBG:  Recent Labs Lab 12/03/15 0744 12/04/15 0750  GLUCAP 108* 100*   Urine analysis:    Component Value Date/Time   COLORURINE YELLOW 12/02/2015 [place] 12/02/2015 1328   LABSPEC 1.012  12/02/2015 1328   PHURINE 7.0 12/02/2015 1328   GLUCOSEU NEGATIVE 12/02/2015 1328   HGBUR MODERATE* 12/02/2015 [place] 12/02/2015 1328   BILIRUBINUR NEG 02/12/2014 1207   KETONESUR 15* 12/02/2015 1328   PROTEINUR 100* 12/02/2015 1328   PROTEINUR NEG 02/12/2014 1207   UROBILINOGEN 0.2 03/06/2015 2133   UROBILINOGEN 0.2 02/12/2014 1207   NITRITE NEGATIVE 12/02/2015 1328   NITRITE NEG 02/12/2014 1207   LEUKOCYTESUR NEGATIVE 12/02/2015 1328    Radiology Studies: Ct Abdomen Pelvis W Contrast 12/02/2015  Mild distention of small bowel and stomach suggesting ileus. No focal transition point or mass lesion is present. 2. Moderate stool throughout the colon without obstruction. 3. Extensive atherosclerotic calcifications are stable. 4. Multiple small cysts and hemangiomas of the liver are stable.   Scheduled Meds: . amLODipine  5 mg Oral Daily  . antiseptic oral rinse  7 mL Mouth Rinse BID  . feeding supplement  1 Container Oral BID BM  . fluticasone furoate-vilanterol  1 puff Inhalation Daily  . linaclotide  290 mcg Oral QAC breakfast  . losartan  100 mg Oral Daily  . methylPREDNISolone (SOLU-MEDROL) injection  40 mg Intravenous Daily  . multivitamin with minerals  1 tablet Oral Daily  . pantoprazole (PROTONIX) IV  40 mg Intravenous Q24H  . PARoxetine  20 mg Oral Daily  . polyethylene glycol  17 g Oral Daily  . sennosides  10 mL Oral BID  . sodium chloride flush  3 mL Intravenous Q12H   Continuous Infusions: . sodium chloride 75 mL/hr at 12/04/15 0209     LOS: 2 days   Time spent: 20 minutes   Faye Ramsay, MD [facility] Pager [phone]  If 7PM-7AM, please contact night-coverage [email or web address] Password [email or web address] 12/04/2015, 11:11 AM

## 2015-12-04 NOTE — Progress Notes (Signed)
Clamped NG at 1030

## 2015-12-05 ENCOUNTER — Inpatient Hospital Stay (HOSPITAL_COMMUNITY): Payer: Medicare Other

## 2015-12-05 LAB — BASIC METABOLIC PANEL
ANION GAP: 10 (ref 5–15)
BUN: <5 mg/dL — ABNORMAL LOW (ref 6–20)
CHLORIDE: 97 mmol/L — AB (ref 101–111)
CO2: 29 mmol/L (ref 22–32)
Calcium: 8.6 mg/dL — ABNORMAL LOW (ref 8.9–10.3)
Creatinine, Ser: 0.53 mg/dL (ref 0.44–1.00)
Glucose, Bld: 121 mg/dL — ABNORMAL HIGH (ref 65–99)
POTASSIUM: 3 mmol/L — AB (ref 3.5–5.1)
SODIUM: 136 mmol/L (ref 135–145)

## 2015-12-05 LAB — CBC
HCT: 37.8 % (ref 36.0–46.0)
HEMOGLOBIN: 12.4 g/dL (ref 12.0–15.0)
MCH: 29 pg (ref 26.0–34.0)
MCHC: 32.8 g/dL (ref 30.0–36.0)
MCV: 88.3 fL (ref 78.0–100.0)
PLATELETS: 369 10*3/uL (ref 150–400)
RBC: 4.28 MIL/uL (ref 3.87–5.11)
RDW: 14.1 % (ref 11.5–15.5)
WBC: 19.7 10*3/uL — AB (ref 4.0–10.5)

## 2015-12-05 LAB — GLUCOSE, CAPILLARY: GLUCOSE-CAPILLARY: 104 mg/dL — AB (ref 65–99)

## 2015-12-05 LAB — MAGNESIUM: MAGNESIUM: 1.7 mg/dL (ref 1.7–2.4)

## 2015-12-05 MED ORDER — MAGNESIUM SULFATE 2 GM/50ML IV SOLN
2.0000 g | Freq: Once | INTRAVENOUS | Status: AC
  Administered 2015-12-05: 2 g via INTRAVENOUS
  Filled 2015-12-05: qty 50

## 2015-12-05 MED ORDER — PREDNISONE 20 MG PO TABS
40.0000 mg | ORAL_TABLET | Freq: Every day | ORAL | Status: DC
  Administered 2015-12-06: 40 mg via ORAL
  Filled 2015-12-05: qty 2

## 2015-12-05 MED ORDER — PANTOPRAZOLE SODIUM 40 MG PO TBEC
40.0000 mg | DELAYED_RELEASE_TABLET | Freq: Every day | ORAL | Status: DC
  Administered 2015-12-05 – 2015-12-06 (×2): 40 mg via ORAL
  Filled 2015-12-05 (×2): qty 1

## 2015-12-05 MED ORDER — HYDRALAZINE HCL 20 MG/ML IJ SOLN
10.0000 mg | INTRAMUSCULAR | Status: DC | PRN
Start: 2015-12-05 — End: 2015-12-06

## 2015-12-05 MED ORDER — POTASSIUM CHLORIDE CRYS ER 20 MEQ PO TBCR
40.0000 meq | EXTENDED_RELEASE_TABLET | Freq: Once | ORAL | Status: AC
  Administered 2015-12-05: 40 meq via ORAL
  Filled 2015-12-05: qty 2

## 2015-12-05 NOTE — Progress Notes (Signed)
Patient ID: Tamara Adams, female   DOB: 01/05/1941, 75 y.o.   MRN: [id number]    PROGRESS NOTE    Tamara Adams  HYI:[id number] DOB: 1941/01/09 DOA: 12/02/2015  PCP: Beatrice Lecher, MD   Brief Narrative:  75 y.o. female with history of small bowel obstruction (s/p surgery x 2), COPD on 4 L of oxygen, hypertension, hyperlipidemia, presented to [facility] with main concern of progressively worsening abd pain with poor oral intake, several episodes of non bloody vomiting. In ED, CT abdomen/pelvis showed ileus without focal transition point. NG tube was started in ED.  Assessment & Plan:  Nausea and vomiting with abd pain secondary to ileus, constipation  - reports feeling better this AM, no nausea or abd pain, no vomiting, wants to have NGT out - no abd tenderness on exam - had BM this AM, continue with bowel regimen   - NGT out, advance diet to see if pt tolerating   Acute COPD exacerbation  - Patient is on 4 liter oxygen at home - was started on solumedrol 40 mg IV BID, taper down to Prednisone  - Atrovent nebs and prn albuterol nebs - Mucinex for cough - Continue home Breo ellipta inhaler  HTN, essential  - continue amlodipine, losartan - IV hydralazine when necessary added   Protein-calorie malnutrition, severe ([place]): - consult to nutrition - underweight, Body mass index is 14.3 kg/(m^2). - advance diet   GERD - IV protonix  Hypokalemia - keep K ~4 and Mg ~2 - will need more supplementation today  - BMP and Mg level in AM  Depression and anxiety - continue home medications: Paxil and prn IV ativan  DVT prophylaxis: SCD's Code Status: Full  Family Communication: Patient at bedside  Disposition Plan: Home in 1-2 days   Consultants:   None  Procedures:   None  Antimicrobials:   None  Subjective: Reports feeling better, no N/V, abd pain.   Objective: Filed Vitals:   12/04/15 1320 12/04/15 2126 12/05/15 0535 12/05/15 0841  BP: 172/69 136/55 183/72   Pulse:  98 83 106   Temp: 98.5 F (36.9 C) 98.6 F (37 C) 98.8 F (37.1 C)   TempSrc: Oral Oral    Resp:  18    Height:      Weight:      SpO2: 94% 97% 94% 97%    Intake/Output Summary (Last 24 hours) at 12/05/15 1117 Last data filed at 12/05/15 0531  Gross per 24 hour  Intake 1946.25 ml  Output    600 ml  Net 1346.25 ml   Filed Weights   12/02/15 1310 12/02/15 2126  Weight: 36.288 kg (80 lb) 37.195 kg (82 lb)    Examination:  General exam: Appears calm and comfortable, cachectic  Respiratory system: Clear to auscultation. Respiratory effort normal. Cardiovascular system: S1 & S2 heard, RRR. No JVD, rubs, gallops or clicks. No pedal edema. Gastrointestinal system: Abdomen is soft and nontender. NGT in place with significant brownish drainage  Central nervous system: Alert and oriented. No focal neurological deficits.  Data Reviewed: I have personally reviewed following labs and imaging studies  CBC:  Recent Labs Lab 12/02/15 1345 12/03/15 0450 12/04/15 0637 12/05/15 0631  WBC 7.5 9.1 17.3* 19.7*  NEUTROABS 5.2  --   --   --   HGB 12.3 12.2 11.6* 12.4  HCT 37.4 37.0 37.3 37.8  MCV 89.0 90.5 91.0 88.3  PLT 441* 372 403* 786   Basic Metabolic Panel:  Recent Labs Lab 12/02/15 1345 12/03/15 0450  12/04/15 0637 12/05/15 0631  NA 135 141 141 136  K 3.4* 4.0 2.9* 3.0*  CL 96* 98* 99* 97*  CO2 35* '30 28 29  '$ GLUCOSE 121* 112* 113* 121*  BUN '8 9 8 '$ <5*  CREATININE 0.60 0.78 0.61 0.53  CALCIUM 9.0 9.1 8.8* 8.6*  MG  --  1.5* 1.6* 1.7   Liver Function Tests:  Recent Labs Lab 12/02/15 1345  AST 30  ALT 17  ALKPHOS 80  BILITOT 0.3  PROT 6.6  ALBUMIN 3.5    Recent Labs Lab 12/02/15 1345  LIPASE 35   Coagulation Profile:  Recent Labs Lab 12/02/15 2302  INR 1.18   CBG:  Recent Labs Lab 12/03/15 0744 12/04/15 0750 12/05/15 0751  GLUCAP 108* 100* 104*   Urine analysis:    Component Value Date/Time   COLORURINE YELLOW 12/02/2015 [place] 12/02/2015 1328   LABSPEC 1.012 12/02/2015 1328   PHURINE 7.0 12/02/2015 1328   GLUCOSEU NEGATIVE 12/02/2015 1328   HGBUR MODERATE* 12/02/2015 1328   BILIRUBINUR NEGATIVE 12/02/2015 1328   BILIRUBINUR NEG 02/12/2014 1207   KETONESUR 15* 12/02/2015 1328   PROTEINUR 100* 12/02/2015 1328   PROTEINUR NEG 02/12/2014 1207   UROBILINOGEN 0.2 03/06/2015 2133   UROBILINOGEN 0.2 02/12/2014 1207   NITRITE NEGATIVE 12/02/2015 1328   NITRITE NEG 02/12/2014 1207   LEUKOCYTESUR NEGATIVE 12/02/2015 1328    Radiology Studies: Ct Abdomen Pelvis W Contrast 12/02/2015  Mild distention of small bowel and stomach suggesting ileus. No focal transition point or mass lesion is present. 2. Moderate stool throughout the colon without obstruction. 3. Extensive atherosclerotic calcifications are stable. 4. Multiple small cysts and hemangiomas of the liver are stable.   Scheduled Meds: . amLODipine  5 mg Oral Daily  . antiseptic oral rinse  7 mL Mouth Rinse BID  . feeding supplement  1 Container Oral BID BM  . fluticasone furoate-vilanterol  1 puff Inhalation Daily  . linaclotide  290 mcg Oral QAC breakfast  . losartan  100 mg Oral Daily  . methylPREDNISolone (SOLU-MEDROL) injection  20 mg Intravenous Daily  . multivitamin with minerals  1 tablet Oral Daily  . pantoprazole  40 mg Oral Daily  . PARoxetine  20 mg Oral Daily  . polyethylene glycol  17 g Oral Daily  . sennosides  10 mL Oral BID  . sodium chloride flush  3 mL Intravenous Q12H   Continuous Infusions:     LOS: 3 days   Time spent: 20 minutes   Faye Ramsay, MD [facility] Pager [phone]  If 7PM-7AM, please contact night-coverage [email or web address] Password [email or web address] 12/05/2015, 11:17 AM

## 2015-12-05 NOTE — Progress Notes (Signed)
Text paged Dr. Doyle Askew the following...NG tube noted with tip in the upper portion stomach and side hole just above the gastroesophageal junction. Advancement of approximately 8-10 cm again suggested.

## 2015-12-06 LAB — MAGNESIUM: Magnesium: 1.6 mg/dL — ABNORMAL LOW (ref 1.7–2.4)

## 2015-12-06 LAB — GLUCOSE, CAPILLARY: Glucose-Capillary: 91 mg/dL (ref 65–99)

## 2015-12-06 LAB — BASIC METABOLIC PANEL
ANION GAP: 10 (ref 5–15)
BUN: 8 mg/dL (ref 6–20)
CALCIUM: 8.6 mg/dL — AB (ref 8.9–10.3)
CO2: 33 mmol/L — AB (ref 22–32)
Chloride: 94 mmol/L — ABNORMAL LOW (ref 101–111)
Creatinine, Ser: 0.53 mg/dL (ref 0.44–1.00)
GFR calc Af Amer: >60 mL/min (ref >60)
GLUCOSE: 98 mg/dL (ref 65–99)
Potassium: 3.1 mmol/L — ABNORMAL LOW (ref 3.5–5.1)
Sodium: 137 mmol/L (ref 135–145)

## 2015-12-06 LAB — CBC
HEMATOCRIT: 32.5 % — AB (ref 36.0–46.0)
Hemoglobin: 10.5 g/dL — ABNORMAL LOW (ref 12.0–15.0)
MCH: 28.5 pg (ref 26.0–34.0)
MCHC: 32.3 g/dL (ref 30.0–36.0)
MCV: 88.1 fL (ref 78.0–100.0)
PLATELETS: 321 10*3/uL (ref 150–400)
RBC: 3.69 MIL/uL — ABNORMAL LOW (ref 3.87–5.11)
RDW: 14.1 % (ref 11.5–15.5)
WBC: 9.6 10*3/uL (ref 4.0–10.5)

## 2015-12-06 MED ORDER — ONDANSETRON 4 MG PO TBDP: ORAL_TABLET | ORAL | Status: DC

## 2015-12-06 MED ORDER — PREDNISONE 10 MG PO TABS: ORAL_TABLET | ORAL | Status: DC

## 2015-12-06 MED ORDER — MAGNESIUM OXIDE 400 (241.3 MG) MG PO TABS: 400.0000 mg | ORAL_TABLET | Freq: Every day | ORAL | Status: DC

## 2015-12-06 MED ORDER — POTASSIUM CHLORIDE ER 10 MEQ PO TBCR: 10.0000 meq | EXTENDED_RELEASE_TABLET | Freq: Every day | ORAL | Status: DC

## 2015-12-06 NOTE — Progress Notes (Signed)
Pt discharged home by MD but is just waiting for her ride home this pm. Pt says she will be able to get her ride after lunch

## 2015-12-06 NOTE — Progress Notes (Signed)
Pt discharged home with her sister in stable condition

## 2015-12-06 NOTE — Consult Note (Signed)
   [facility] CM Inpatient Consult   12/06/2015  Tamara Adams Nov 02, 1940 [id number]   Patient screened for [facility] services. Went to bedside to offer and explain [facility] Care Management program with patient. Discussed that she lives in [place] and that she does not think she needs home visits from [facility]. Noted she has had x1 admission in the past 6 months. She also reports she usually manages her COPD well as she has had it for years. Agreeable to EMMI transition calls for COPD. Left contact information and brochure at bedside. Appreciative of visit. Will make inpatient RNCM aware of visit.  Marthenia Rolling, MSN-Ed, RN,BSN [facility] Liaison [phone]

## 2015-12-06 NOTE — Discharge Instructions (Signed)
Ileus ° Ileus is a condition in which the intestines, also called the bowels, stop working and moving correctly. If the intestines stop working, food cannot pass through to get digested. The intestines are hollow organs that digest food after the food leaves the stomach. These organs are long, muscular tubes that connect the stomach to the rectum. When ileus occurs, the muscular contractions that cause food to move through the intestines stop happening as they normally would. °Ileus can occur for various reasons. This condition is a serious problem that usually requires hospitalization. It can cause symptoms such as nausea, abdominal pain, and bloating. Ileus can last from a few hours to a few days. If the intestines stop working because of a blockage, that is a different condition that is called a bowel obstruction. °CAUSES °This condition may be caused by: °· Surgery on the abdomen. °· An infection or inflammation in the abdomen. This includes inflammation of the lining of the abdomen (peritonitis). °· Infection or inflammation in other parts of the body, such as pneumonia or pancreatitis. °· Passage of gallstones or kidney stones. °· Damage to the nerves or blood vessels that go to the intestines. °· A collection of blood within the abdominal cavity. °· Imbalance in the salts in the blood (electrolytes). °· Injury to the brain or spinal cord. °· Medicines. Many medicines, including strong pain medicines, can cause ileus or make it worse. °SYMPTOMS °Symptoms of this condition include: °· Bloating of the abdomen. °· Pain or discomfort in the abdomen. °· Poor appetite. °· Nausea and vomiting. °· Lack of normal bowel sounds, such as "growling" in the stomach. °DIAGNOSIS °This condition may be diagnosed with: °· A physical exam and medical history. °· X-rays or a CT scan of the abdomen. °You may also have other tests to help find the cause of the condition. °TREATMENT °Treatment for this condition may  include: °· Resting the intestines until they start to work again. This is often done by: °¨ Stopping oral intake of food and drink. You will be given fluid through an IV tube to prevent dehydration. °¨ Placing a small tube (nasogastric tube or NG tube) that is passed through your nose and into your stomach. The tube is attached to a suction device and keeps the stomach emptied out. This allows the bowels to rest and also helps to reduce nausea and vomiting. °· Correcting any electrolyte imbalance by giving supplements in the IV fluid. °· Stopping any medicines that might make ileus worse. °· Treating any condition that may have caused ileus. °HOME CARE INSTRUCTIONS °· Follow instructions from your health care provider about diet and fluid intake. Usually, you will be told to: °¨ Drink plenty of clear fluids. °¨ Avoid alcohol. °¨ Avoid caffeine. °¨ Eat a bland diet. °· Get plenty of rest. Return to your normal activities as told by your health care provider. °· Take over-the-counter and prescription medicines only as told by your health care provider. °· Keep all follow-up visits as told by your health care provider. This is important. °SEEK MEDICAL CARE IF: °· You have nausea, vomiting, or abdominal discomfort. °· You have a fever. °SEEK IMMEDIATE MEDICAL CARE IF: °· You have severe abdominal pain or bloating. °· You cannot eat or drink without vomiting. °  °This information is not intended to replace advice given to you by your health care provider. Make sure you discuss any questions you have with your health care provider. °  °Document Released: 07/12/2003 Document Revised: 03/30/2015 Document   Reviewed: 09/02/2014 °Elsevier Interactive Patient Education ©2016 [employer] ° °

## 2015-12-06 NOTE — Discharge Summary (Signed)
Physician Discharge Summary  Tamara Adams ZHY:[id number] DOB: 18-Sep-1940 DOA: 12/02/2015  PCP: Beatrice Lecher, MD  Admit date: 12/02/2015 Discharge date: 12/06/2015  Recommendations for Outpatient Follow-up:  1. Pt will need to follow up with PCP in 1 week post discharge 2. Please obtain BMP to evaluate electrolytes and kidney function, K and Mg level  3. Please also check CBC to evaluate Hg and Hct levels 4. K-dur and Mg Ox sent in electronically, please make sure pt has gotten the meds  Discharge Diagnoses:  Principal Problem:   SBO (small bowel obstruction) ([place]) Active Problems:   HLD (hyperlipidemia)   COPD (chronic obstructive pulmonary disease) ([place])  Discharge Condition: Stable  Diet recommendation: Heart healthy diet discussed in details   History of present illness:   Brief Narrative:  75 y.o. female with history of small bowel obstruction (s/p surgery x 2), COPD on 4 L of oxygen, hypertension, hyperlipidemia, presented to [facility] with main concern of progressively worsening abd pain with poor oral intake, several episodes of non bloody vomiting. In ED, CT abdomen/pelvis showed ileus without focal transition point. NG tube was started in ED.  Assessment & Plan:  Nausea and vomiting with abd pain secondary to ileus, constipation  - reports feeling better this AM, no nausea or abd pain, no vomiting, NGT has been out since yesterday  - no abd tenderness on exam - had BM this AM, tolerating diet well - wants to go home   Acute COPD exacerbation  - Patient is on 4 liter oxygen at home - continue Prednisone taper upon discharge  - Atrovent nebs and prn albuterol nebs - Mucinex for cough - Continue home Breo ellipta inhaler  HTN, essential  - continue amlodipine, losartan, metoprolol   Protein-calorie malnutrition, severe ([place]): - consult to nutrition - underweight, Body mass index is 14.3 kg/(m^2).  GERD  Hypokalemia and hypomagnesemia  - keep K ~4 and Mg ~2 -  will need more supplementation upon discharge - pt wants scripts sent electronically, please make sure pt got scripts   Depression and anxiety - continue home medications: Paxil and prn IV ativan  DVT prophylaxis: SCD's Code Status: Full  Family Communication: Patient at bedside  Disposition Plan: Home  Consultants:   None  Procedures:   None  Antimicrobials:   None  Discharge Exam: Filed Vitals:   12/05/15 2202 12/06/15 0529  BP: 153/63 143/69  Pulse: 78 77  Temp: 97.8 F (36.6 C) 97.9 F (36.6 C)  Resp:     Filed Vitals:   12/05/15 1500 12/05/15 2202 12/06/15 0529 12/06/15 0854  BP: 156/67 153/63 143/69   Pulse: 105 78 77   Temp: 98.4 F (36.9 C) 97.8 F (36.6 C) 97.9 F (36.6 C)   TempSrc: Oral Oral Oral   Resp:      Height:      Weight:      SpO2: 96% 99% 97% 96%    General: Pt is alert, follows commands appropriately, not in acute distress Cardiovascular: Regular rate and rhythm, no rubs, no gallops Respiratory: Clear to auscultation bilaterally, no wheezing, no crackles, no rhonchi Abdominal: Soft, non tender, non distended, bowel sounds +, no guarding  Discharge Instructions  Discharge Instructions    Diet - low sodium heart healthy    Complete by:  As directed      Increase activity slowly    Complete by:  As directed             Medication List    STOP  taking these medications        LORazepam 1 MG tablet  Commonly known as:  ATIVAN     NICOTROL 10 MG inhaler  Generic drug:  nicotine      TAKE these medications        acetaminophen 500 MG tablet  Commonly known as:  TYLENOL  Take 500 mg by mouth every 6 (six) hours as needed for mild pain.     amLODipine 5 MG tablet  Commonly known as:  NORVASC  Take 1 tablet (5 mg total) by mouth daily.     BREO ELLIPTA 100-25 MCG/INH Aepb  Generic drug:  fluticasone furoate-vilanterol  USE 1 INHALATION DAILY     ENSURE PLUS Liqd  Take 237 mLs by mouth 2 (two) times daily between  meals. For weight gain     linaclotide 290 MCG Caps capsule  Commonly known as:  LINZESS  Take 1 capsule (290 mcg total) by mouth daily.     losartan 100 MG tablet  Commonly known as:  COZAAR  Take 1 tablet (100 mg total) by mouth daily.     magnesium oxide 400 (241.3 Mg) MG tablet  Commonly known as:  MAG-OX  Take 1 tablet (400 mg total) by mouth daily.     metoprolol succinate 50 MG 24 hr tablet  Commonly known as:  TOPROL-XL  Take 50 mg by mouth daily.     multivitamin with minerals Tabs tablet  Take 1 tablet by mouth daily. Pt uses One-A-Day brand     omeprazole 40 MG capsule  Commonly known as:  PRILOSEC  TAKE 1 CAPSULE DAILY FOR GASTROINTESTINAL ULCER     ondansetron 4 MG disintegrating tablet  Commonly known as:  ZOFRAN-ODT  TAKE 1 TABLET EVERY 8 HOURS AS NEEDED FOR NAUSEA     OSCIMIN 0.125 MG SL tablet  Generic drug:  hyoscyamine  Place 0.125 mg under the tongue 3 (three) times daily before meals.     OXYGEN  Inhale 4 L into the lungs continuous.     PARoxetine 20 MG tablet  Commonly known as:  PAXIL  Take 1 tablet (20 mg total) by mouth daily.     potassium chloride 10 MEQ tablet  Commonly known as:  K-DUR  Take 1 tablet (10 mEq total) by mouth daily.     predniSONE 10 MG tablet  Commonly known as:  DELTASONE  Take 30 mg tablet 5/16 and taper down by 10 mg daily until completed     PROAIR HFA 108 (90 Base) MCG/ACT inhaler  Generic drug:  albuterol  USE 2 TO 4 INHALATIONS EVERY 4 TO 6 HOURS AS NEEDED     albuterol (2.5 MG/3ML) 0.083% nebulizer solution  Commonly known as:  PROVENTIL  USE 1 VIAL (3 ML) VIA NEBULIZER EVERY 6 HOURS AS NEEDED FOR WHEEZING OR SHORTNESS OF BREATH     traMADol 50 MG tablet  Commonly known as:  ULTRAM  Take 50 mg by mouth every 6 (six) hours as needed (pain).     umeclidinium bromide 62.5 MCG/INH Aepb  Commonly known as:  INCRUSE ELLIPTA  Inhale 1 Inhaler into the lungs daily.            Follow-up Information     Follow up with METHENEY,CATHERINE, MD.   Specialty:  Family Medicine   Contact information:   [phone] [place] [address] [place] [address] [phone]       Call Faye Ramsay, MD.   Specialty:  Internal Medicine   Why:  As needed call my cell [phone]   Contact information:   [address] [place] [address] [phone]        The results of significant diagnostics from this hospitalization (including imaging, microbiology, ancillary and laboratory) are listed below for reference.     Microbiology: No results found for this or any previous visit (from the past 240 hour(s)).   Labs: Basic Metabolic Panel:  Recent Labs Lab 12/02/15 1345 12/03/15 0450 12/04/15 0637 12/05/15 0631 12/06/15 0638  NA 135 141 141 136 137  K 3.4* 4.0 2.9* 3.0* 3.1*  CL 96* 98* 99* 97* 94*  CO2 35* '30 28 29 '$ 33*  GLUCOSE 121* 112* 113* 121* 98  BUN '8 9 8 '$ <5* 8  CREATININE 0.60 0.78 0.61 0.53 0.53  CALCIUM 9.0 9.1 8.8* 8.6* 8.6*  MG  --  1.5* 1.6* 1.7 1.6*   Liver Function Tests:  Recent Labs Lab 12/02/15 1345  AST 30  ALT 17  ALKPHOS 80  BILITOT 0.3  PROT 6.6  ALBUMIN 3.5   CBC:  Recent Labs Lab 12/02/15 1345 12/03/15 0450 12/04/15 0637 12/05/15 0631 12/06/15 0638  WBC 7.5 9.1 17.3* 19.7* 9.6  NEUTROABS 5.2  --   --   --   --   HGB 12.3 12.2 11.6* 12.4 10.5*  HCT 37.4 37.0 37.3 37.8 32.5*  MCV 89.0 90.5 91.0 88.3 88.1  PLT 441* 372 403* 369 321   BNP: BNP (last 3 results)  Recent Labs  01/11/15 1235  BNP 44.1   CBG:  Recent Labs Lab 12/03/15 0744 12/04/15 0750 12/05/15 0751 12/06/15 0759  GLUCAP 108* 100* 104* 91    SIGNED: Time coordinating discharge:  30 minutes  MAGICK-MYERS, ISKRA, MD  [facility] 12/06/2015, 9:54 AM Pager [phone]  If 7PM-7AM, please contact night-coverage [email or web address] Password [email or web address]

## 2015-12-06 NOTE — Progress Notes (Signed)
Discharge education provided with no concerns voiced. Pt still waiting for transport home

## 2015-12-08 ENCOUNTER — Ambulatory Visit: Payer: Self-pay | Admitting: Pulmonary Disease

## 2015-12-12 ENCOUNTER — Other Ambulatory Visit: Payer: Self-pay

## 2015-12-12 DIAGNOSIS — K56609 Unspecified intestinal obstruction, unspecified as to partial versus complete obstruction: Secondary | ICD-10-CM

## 2015-12-12 DIAGNOSIS — J441 Chronic obstructive pulmonary disease with (acute) exacerbation: Secondary | ICD-10-CM

## 2015-12-12 NOTE — Patient Outreach (Signed)
[facility]) Care Management  12/12/2015  Tamara Adams 05/18/1941 [id number]   SUBJECTIVE; Telephone call to patient regarding EMMI COPD referral.  HIPAA verified with patient. Discussed and offered [place] management program to patient. Patient states she would like the EMMI  COPD calls to stop because she is getting 3-4 calls daily.  Patient verbally agreed to receive follow up by a [facility] care management case manager.  Patient confirms that she was discharged from the hospital on 12/06/15 due to a small bowel obstruction. Patient denies having any home health services. Patient states she is on oxygen at 4L continuous. Patient states her oxygen is provided by [facility].  Patient states she has transportation to her appointments. Patient states her follow up appointment with her primary MD is on Thursday 12/15/15.  Patient states she is followed by pulmonologist, Dr. Eartha Inch. Patient states she is not taking the two prescribed medications listed on her discharge summary, K-DUR and Magnesium oxide.  Patient states she did not realize she was suppose to be on those two medications. RNCM advised patient to call her pharmacy and primary MD to confirm these two medications, K-DUR and Magnesium oxide. Patient verbalized understanding. Patient states her pharmacy is [facility]. Patient states she is taking all of her other medications as prescribe and is able to obtain her medications. Patient states she has good family support.   ASSESSMENT: EMMI COPD transition program. Hospital stay 12/02/15 to 12/06/15.  Primary MD follow up appointment scheduled for 12/15/15.   PLAN: RNCM will refer patient to community case manager for transition of care follow up.   Quinn Plowman RN,BSN,CCM [facility] Telephonic  [phone]

## 2015-12-14 ENCOUNTER — Other Ambulatory Visit: Payer: Self-pay | Admitting: *Deleted

## 2015-12-15 ENCOUNTER — Encounter: Payer: Self-pay | Admitting: Family Medicine

## 2015-12-15 ENCOUNTER — Ambulatory Visit: Payer: Self-pay | Admitting: Family Medicine

## 2015-12-15 ENCOUNTER — Ambulatory Visit (INDEPENDENT_AMBULATORY_CARE_PROVIDER_SITE_OTHER): Payer: Medicare Other | Admitting: Family Medicine

## 2015-12-15 VITALS — BP 163/65 | HR 87 | Wt 80.1 lb

## 2015-12-15 DIAGNOSIS — E876 Hypokalemia: Secondary | ICD-10-CM | POA: Diagnosis not present

## 2015-12-15 DIAGNOSIS — K2270 Barrett's esophagus without dysplasia: Secondary | ICD-10-CM | POA: Diagnosis not present

## 2015-12-15 DIAGNOSIS — R109 Unspecified abdominal pain: Secondary | ICD-10-CM

## 2015-12-15 DIAGNOSIS — G8929 Other chronic pain: Secondary | ICD-10-CM

## 2015-12-15 DIAGNOSIS — K56609 Unspecified intestinal obstruction, unspecified as to partial versus complete obstruction: Secondary | ICD-10-CM

## 2015-12-15 DIAGNOSIS — K5669 Other intestinal obstruction: Secondary | ICD-10-CM

## 2015-12-15 DIAGNOSIS — K567 Ileus, unspecified: Secondary | ICD-10-CM

## 2015-12-15 DIAGNOSIS — J439 Emphysema, unspecified: Secondary | ICD-10-CM

## 2015-12-15 LAB — CBC WITH DIFFERENTIAL/PLATELET
BASOS ABS: 64 {cells}/uL (ref 0–200)
Basophils Relative: 1 %
EOS PCT: 3 %
Eosinophils Absolute: 192 cells/uL (ref 15–500)
HEMATOCRIT: 36.5 % (ref 35.0–45.0)
HEMOGLOBIN: 11.8 g/dL (ref 11.7–15.5)
LYMPHS ABS: 1408 {cells}/uL (ref 850–3900)
Lymphocytes Relative: 22 %
MCH: 28.6 pg (ref 27.0–33.0)
MCHC: 32.3 g/dL (ref 32.0–36.0)
MCV: 88.4 fL (ref 80.0–100.0)
MONO ABS: 704 {cells}/uL (ref 200–950)
MPV: 8.3 fL (ref 7.5–12.5)
Monocytes Relative: 11 %
NEUTROS ABS: 4032 {cells}/uL (ref 1500–7800)
NEUTROS PCT: 63 %
Platelets: 532 10*3/uL — ABNORMAL HIGH (ref 140–400)
RBC: 4.13 MIL/uL (ref 3.80–5.10)
RDW: 14.9 % (ref 11.0–15.0)
WBC: 6.4 10*3/uL (ref 3.8–10.8)

## 2015-12-15 MED ORDER — HYDROCODONE-ACETAMINOPHEN 10-325 MG PO TABS: 1.0000 | ORAL_TABLET | Freq: Four times a day (QID) | ORAL | Status: DC | PRN

## 2015-12-15 MED ORDER — DICYCLOMINE HCL 20 MG PO TABS: 20.0000 mg | ORAL_TABLET | Freq: Three times a day (TID) | ORAL | Status: DC

## 2015-12-15 NOTE — Progress Notes (Signed)
Subjective:    CC: Hospital F/U  HPI: [name] comes in today to follow-up for hospital follow-up. She is 75 year old female with a history of small bowel obstruction status post surgery 2. She is also oxygen dependent with her COPD. Currently on 4 L. She presented with worsening abdominal pain and anorexia. She had several episodes of vomiting. She had a CT abdomen and pelvis which showed an ileus and she was admitted to the hospital an NG tube was inserted. She was also treated for an acute COPD exacerbation as well and put on prednisone and given nebulizer treatments. She did try some old hydrocodone that I gave her from 2014 and September actually did provide significant pain relief for her. She says in fact it worked better than the morphine that they gave her at the hospital. She is an allergic to oxycodone.  She was noted to have low potassium as well as low magnesium was started on supplementation.  COPD-she does feel like she is back to her baseline. No increased shortness of breath or wheezing.  Past medical history, Surgical history, Family history not pertinant except as noted below, Social history, Allergies, and medications have been entered into the medical record, reviewed, and corrections made.   Review of Systems: No fevers, chills, night sweats, weight loss, chest pain, or shortness of breath.   Objective:    General: Well Developed, well nourished, and in no acute distress.  Neuro: Alert and oriented x3, extra-ocular muscles intact, sensation grossly intact.  HEENT: Normocephalic, atraumatic  Skin: Warm and dry, no rashes. Cardiac: Regular rate and rhythm, no murmurs rubs or gallops, no lower extremity edema.  Respiratory: Clear to auscultation bilaterally. Not using accessory muscles, speaking in full sentences.   Impression and Recommendations:   Small bowel obstruction/ileus-we will obtain a BMP and magnesium level, CBC.  Chronic abdominal pain-I would like for  her to try dicyclomine. Will start with 20 mg with each meal and at bedtime. If this is too much then recommend that she split the tablet in half. We'll also give her small quantity of hydrocodone to use very sparingly. Reminded her about potential for abuse and it that it can be very habit forming. Recommend keep her follow-up with GI. She did evidently have a negative workup for delayed gastric emptying.  Possible Barrett's esophagus and her description today. Unfortunately I was unable to view the actual EGD report from March done at [facility]. Will need to try to get a copy of that.  COPD-stable. She is feeling much better and back to her baseline.  Hypokalemia and hypomagnesemia-due to recheck potassium and magnesium levels.  Calorie malnutrition, severe she is underweight and they did consult nutrition while she was in the hospital. She says she's just been trying to eat whatever sounds good to her.

## 2015-12-16 ENCOUNTER — Other Ambulatory Visit: Payer: Self-pay | Admitting: *Deleted

## 2015-12-16 ENCOUNTER — Encounter: Payer: Self-pay | Admitting: *Deleted

## 2015-12-16 ENCOUNTER — Telehealth: Payer: Self-pay | Admitting: *Deleted

## 2015-12-16 DIAGNOSIS — R109 Unspecified abdominal pain: Principal | ICD-10-CM

## 2015-12-16 DIAGNOSIS — G8929 Other chronic pain: Secondary | ICD-10-CM

## 2015-12-16 LAB — BASIC METABOLIC PANEL
BUN: 6 mg/dL — AB (ref 7–25)
CHLORIDE: 92 mmol/L — AB (ref 98–110)
CO2: 33 mmol/L — ABNORMAL HIGH (ref 20–31)
Calcium: 9 mg/dL (ref 8.6–10.4)
Creat: 0.5 mg/dL — ABNORMAL LOW (ref 0.60–0.93)
GLUCOSE: 123 mg/dL — AB (ref 65–99)
POTASSIUM: 4.2 mmol/L (ref 3.5–5.3)
SODIUM: 135 mmol/L (ref 135–146)

## 2015-12-16 LAB — MAGNESIUM: MAGNESIUM: 1.4 mg/dL — AB (ref 1.5–2.5)

## 2015-12-16 MED ORDER — DICYCLOMINE HCL 20 MG PO TABS: 20.0000 mg | ORAL_TABLET | Freq: Three times a day (TID) | ORAL | Status: DC

## 2015-12-16 NOTE — Patient Outreach (Signed)
Tamara Adams) Care Adams   12/16/2015  Tamara Adams 05/15/1941 [id number]  Tamara Adams is an 75 y.o. female  Subjective: New patient for Tamara Adams. This is the first home visit. Pt major problem currently is abdominal pain (hx 2 surgeries for bowel obstruction) wt loss, unable to eat enough to sustain her weight. Secondary problem is her COPD. Pt still smokes some (3 per day) she is O2 dependent. She does follow her medical regimen. She just saw Dr. Madilyn Adams and she was prescribed a new medication for her stomach. The pharmacy has not received the order yet. She has put in a call to the MDs office. She also tells me she can't tell she is on an antidepressant.  Objective:   Review of Systems  Constitutional: Positive for weight loss.  HENT: Negative.   Eyes: Positive for blurred vision.  Respiratory: Positive for cough, sputum production, shortness of breath and wheezing.        Mild sxs COPD which are normal for her. SOB only at rest. Wear 4L O2 at all times.  Cardiovascular: Negative.   Gastrointestinal: Positive for nausea and abdominal pain.  Genitourinary: Negative.   Musculoskeletal: Negative.   Skin: Negative.   Neurological: Positive for weakness.  Endo/Heme/Allergies: Negative.   Psychiatric/Behavioral: Positive for depression.   BP 150/70 mmHg  Pulse 72  Resp 18  Ht 1.6 m ('5\' 3"'$ )  Wt 79 lb (35.834 kg)  BMI 14.00 kg/m2  SpO2 92%  Physical Exam  Constitutional: She is oriented to person, place, and time.  Very frail appearing lady.  Cardiovascular: Normal rate, regular rhythm and normal heart sounds.   Respiratory: She has wheezes.  Diminished breath sounds with some inspiratory wheezes. On O2 at 4L.  GI: Bowel sounds are normal.  Musculoskeletal: Normal range of motion.  Neurological: She is alert and oriented to person, place, and time.  Skin: Skin is warm and dry.  Psychiatric: She has a normal mood and affect. Her behavior is  normal. Judgment and thought content normal.    Encounter Medications:   Outpatient Encounter Prescriptions as of 12/16/2015  Medication Sig Note  . acetaminophen (TYLENOL) 500 MG tablet Take 500 mg by mouth every 6 (six) hours as needed for mild pain.   Marland Kitchen albuterol (PROVENTIL) (2.5 MG/3ML) 0.083% nebulizer solution USE 1 VIAL (3 ML) VIA NEBULIZER EVERY 6 HOURS AS NEEDED FOR WHEEZING OR SHORTNESS OF BREATH (Patient taking differently: USE 1 VIAL (3 ML) VIA NEBULIZER THREE TIMES DAILY)   . amLODipine (NORVASC) 5 MG tablet Take 1 tablet (5 mg total) by mouth daily.   Marland Kitchen BREO ELLIPTA 100-25 MCG/INH AEPB USE 1 INHALATION DAILY (Patient taking differently: USE 1 INHALATION EVERY EVENING)   . ENSURE PLUS (ENSURE PLUS) LIQD Take 237 mLs by mouth 2 (two) times daily between meals. For weight gain   . HYDROcodone-acetaminophen (NORCO) 10-325 MG tablet Take 1 tablet by mouth every 6 (six) hours as needed.   Marland Kitchen KLOR-CON M10 10 MEQ tablet  12/15/2015: Received from: External Pharmacy  . Linaclotide (LINZESS) 290 MCG CAPS capsule Take 1 capsule (290 mcg total) by mouth daily.   Marland Kitchen losartan (COZAAR) 100 MG tablet Take 1 tablet (100 mg total) by mouth daily.   . metoprolol succinate (TOPROL-XL) 50 MG 24 hr tablet Take 50 mg by mouth daily. 12/03/2015: Pt could not recall time of last dose - usually between 8am and 12pm  . Multiple Vitamin (MULTIVITAMIN WITH MINERALS) TABS tablet Take 1 tablet by  mouth daily. Pt uses One-A-Day brand   . omeprazole (PRILOSEC) 40 MG capsule TAKE 1 CAPSULE DAILY FOR GASTROINTESTINAL ULCER   . ondansetron (ZOFRAN-ODT) 4 MG disintegrating tablet TAKE 1 TABLET EVERY 8 HOURS AS NEEDED FOR NAUSEA   . OXYGEN Inhale 4 L into the lungs continuous.   Marland Kitchen PARoxetine (PAXIL) 10 MG tablet  12/15/2015: Received from: External Pharmacy  . PROAIR HFA 108 (90 Base) MCG/ACT inhaler USE 2 TO 4 INHALATIONS EVERY 4 TO 6 HOURS AS NEEDED (Patient taking differently: USE 2 TO 4 INHALATIONS EVERY 4 TO 6 HOURS AS  NEEDED FOR SHORTNESS OF BREATH/WHEEZING)   . traMADol (ULTRAM) 50 MG tablet Take 50 mg by mouth every 6 (six) hours as needed (pain).    Marland Kitchen Umeclidinium Bromide (INCRUSE ELLIPTA) 62.5 MCG/INH AEPB Inhale 1 Inhaler into the lungs daily. (Patient taking differently: Inhale 1 puff into the lungs daily. )   . magnesium oxide (MAG-OX) 400 (241.3 Mg) MG tablet Take 1 tablet (400 mg total) by mouth daily. (Patient not taking: Reported on 12/16/2015)   . [DISCONTINUED] dicyclomine (BENTYL) 20 MG tablet Take 1 tablet (20 mg total) by mouth 4 (four) times daily -  before meals and at bedtime. (Patient not taking: Reported on 12/16/2015) 12/16/2015: Pt has not picked it up from pharmacy yet.  . [DISCONTINUED] hyoscyamine (OSCIMIN) 0.125 MG SL tablet Place 0.125 mg under the tongue 3 (three) times daily before meals. 12/12/2015: Patient states she takes as needed.   . [DISCONTINUED] potassium chloride (K-DUR) 10 MEQ tablet Take 1 tablet (10 mEq total) by mouth daily.    No facility-administered encounter medications on file as of 12/16/2015.    Functional Status:   In your present state of health, do you have any difficulty performing the following activities: 12/16/2015 12/02/2015  Hearing? Y N  Vision? Y N  Difficulty concentrating or making decisions? N N  Walking or climbing stairs? N Y  Dressing or bathing? Y N  Doing errands, shopping? Y N  Preparing Food and eating ? Y -  Using the Toilet? N -  In the past six months, have you accidently leaked urine? N -  Do you have problems with loss of bowel control? N -  Managing your Medications? N -  Managing your Finances? N -  Housekeeping or managing your Housekeeping? Y -    Fall/Depression Screening:    PHQ 2/9 Scores 12/16/2015 12/12/2015 02/08/2014 09/25/2012  PHQ - 2 Score 2 0 0 0  PHQ- 9 Score 10 - - -   Fall Risk  12/16/2015 12/12/2015 02/08/2014 09/25/2012  Falls in the past year? No No No -  Risk for fall due to : - - - History of fall(s)     Assessment:  Chronic abdominal pain                         Weight Loss                         COPD                         Depression  Plan:   [facility] CM Care Plan Problem One        Most Recent Value   Care Plan Problem One  Low weight and abdominal pain   Role Documenting the Problem One  Care Adams [place] for Problem  One  Active   THN Long Term Goal (31-90 days)  Pt wt will not be lower than it is today by the end of 90 days.   THN Long Term Goal Start Date  12/16/15   Interventions for Problem One Long Term Goal  Gave ensure coupons, encouraged to eat 6 small meals per day, use Bullet, get your new medication for abdominal pain.   THN CM Short Term Goal #1 (0-30 days)  Pt will start new medication as directed over the next 30 days.   THN CM Short Term Goal #1 Start Date  12/16/15   Interventions for Short Term Goal #1  Encouraged to get medication today.   THN CM Short Term Goal #2 (0-30 days)  Pt will weigh weekly and record over the next 30 days.   THN CM Short Term Goal #2 Start Date  12/16/15   Interventions for Short Term Goal #2  Verified that pt has a scale. Gave [facility] book to record weights.   THN CM Short Term Goal #3 (0-30 days)  Pt will drink 3 cans of ensure plus if unable to eat to eat solid food.   THN CM Short Term Goal #3 Start Date  12/16/15   Interventions for Short Tern Goal #3  Discussed need to have adequate nutritional intake. Gave coupons for Ensure.    [facility] CM Care Plan Problem Two        Most Recent Value   Care Plan Problem Two  COPD   Role Documenting the Problem Two  Care Adams Coordinator   Care Plan for Problem Two  Active   Interventions for Problem Two Long Term Goal   Gave COPD information, discussed action plan, call NP for problems.   THN Long Term Goal (31-90) days  Pt will not go to hospital in the next 90 days for COPD   THN Long Term Goal Start Date  12/16/15   THN CM Short Term Goal #1 (0-30 days)  Pt  will call me if she goes into the yellow zone.   THN CM Short Term Goal #1 Start Date  12/16/15   Interventions for Short Term Goal #2   Reinforced availablily of care manager and 24 hour nurse line.   THN CM Short Term Goal #2 (0-30 days)  Pt will abstain from smoking and use her ecig   THN CM Short Term Goal #2 Start Date  12/16/15   Interventions for Short Term Goal #2  Discussed smoking cessation.     Deloria Lair [facility] [place] [phone]

## 2015-12-16 NOTE — Addendum Note (Signed)
Addended by: Teddy Spike on: 12/16/2015 12:49 PM   Modules accepted: Orders

## 2015-12-16 NOTE — Telephone Encounter (Signed)
Called to have rx d/c'd spoke with teresa she transferred me to [place] to cancel this order. She stated that she would have to submit a form to stop the rx from going out .  New rx sent to local pharmacy.Audelia Hives [place]

## 2015-12-23 ENCOUNTER — Other Ambulatory Visit: Payer: Self-pay | Admitting: *Deleted

## 2015-12-23 DIAGNOSIS — G8929 Other chronic pain: Secondary | ICD-10-CM

## 2015-12-23 DIAGNOSIS — R109 Unspecified abdominal pain: Principal | ICD-10-CM

## 2015-12-23 MED ORDER — TRAMADOL HCL 50 MG PO TABS: 50.0000 mg | ORAL_TABLET | Freq: Four times a day (QID) | ORAL | Status: DC | PRN

## 2015-12-23 MED ORDER — DICYCLOMINE HCL 20 MG PO TABS: 20.0000 mg | ORAL_TABLET | Freq: Three times a day (TID) | ORAL | Status: DC

## 2015-12-23 MED ORDER — LORAZEPAM 1 MG PO TABS: 1.0000 mg | ORAL_TABLET | Freq: Every day | ORAL | Status: DC | PRN

## 2015-12-23 NOTE — Telephone Encounter (Signed)
Pt called and stated that the zofran is not working for her anymore and wanted to know if something else can be sent in for the nausea for her. She has had promethazine in the past and states that this worked well for her. She would like this sent to [place]. Will fwd to pcp for advice.Tamara Adams

## 2015-12-26 ENCOUNTER — Telehealth: Payer: Self-pay

## 2015-12-26 MED ORDER — PROMETHAZINE HCL 25 MG PO TABS: 25.0000 mg | ORAL_TABLET | Freq: Four times a day (QID) | ORAL | Status: DC | PRN

## 2015-12-26 NOTE — Telephone Encounter (Signed)
Tamara Adams states the Zofran is no longer helping with her nausea. She would like to try something else. Please advise.

## 2015-12-27 MED ORDER — ONDANSETRON 8 MG PO TBDP: ORAL_TABLET | ORAL | Status: DC

## 2015-12-27 NOTE — Telephone Encounter (Signed)
I don't have a ton of options. We can try a higher strength and if still not working then may need to have her call her GI.

## 2015-12-27 NOTE — Telephone Encounter (Signed)
Patient advised.

## 2015-12-27 NOTE — Telephone Encounter (Signed)
New rx for phenergan sent.

## 2015-12-29 DIAGNOSIS — J449 Chronic obstructive pulmonary disease, unspecified: Secondary | ICD-10-CM | POA: Diagnosis not present

## 2015-12-29 DIAGNOSIS — R110 Nausea: Secondary | ICD-10-CM | POA: Diagnosis not present

## 2015-12-29 DIAGNOSIS — Z8719 Personal history of other diseases of the digestive system: Secondary | ICD-10-CM | POA: Diagnosis not present

## 2015-12-29 DIAGNOSIS — G8929 Other chronic pain: Secondary | ICD-10-CM | POA: Diagnosis not present

## 2015-12-29 DIAGNOSIS — R197 Diarrhea, unspecified: Secondary | ICD-10-CM | POA: Diagnosis not present

## 2015-12-29 DIAGNOSIS — Z9981 Dependence on supplemental oxygen: Secondary | ICD-10-CM | POA: Diagnosis not present

## 2015-12-29 DIAGNOSIS — K5900 Constipation, unspecified: Secondary | ICD-10-CM | POA: Diagnosis not present

## 2015-12-29 DIAGNOSIS — R634 Abnormal weight loss: Secondary | ICD-10-CM | POA: Diagnosis not present

## 2015-12-29 DIAGNOSIS — Z79899 Other long term (current) drug therapy: Secondary | ICD-10-CM | POA: Diagnosis not present

## 2015-12-29 DIAGNOSIS — Z9889 Other specified postprocedural states: Secondary | ICD-10-CM | POA: Diagnosis not present

## 2015-12-29 DIAGNOSIS — R109 Unspecified abdominal pain: Secondary | ICD-10-CM | POA: Diagnosis not present

## 2016-01-04 ENCOUNTER — Other Ambulatory Visit: Payer: Self-pay | Admitting: *Deleted

## 2016-01-04 NOTE — Patient Outreach (Signed)
[name]) Care Management   01/04/2016  Tamara Adams [birth year] [id number]  Tamara Adams is an 75 y.o. female  Subjective: Pt with no new problems. Bentyl hasn't helped. Remains nauseated much of the time although today she has not been nauseated which is unusual. She saw her GI MD at [facility] and he is referring her to another specialist but that is not until September.  COPD stable.  Objective:   Review of Systems  Constitutional: Positive for weight loss and malaise/fatigue.  HENT: Negative.   Eyes: Negative.   Respiratory: Negative.   Cardiovascular: Negative.   Gastrointestinal: Positive for nausea, vomiting, abdominal pain, diarrhea and constipation.  Musculoskeletal: Negative.   Skin: Negative.   Neurological: Positive for dizziness and weakness.  Endo/Heme/Allergies: Negative.    BP 120/62 mmHg  Pulse 71  Resp 20  Wt 76 lb (34.473 kg)  SpO2 96%  Physical Exam  Constitutional: She is oriented to person, place, and time.  Cardiovascular: Normal rate, regular rhythm and normal heart sounds.   Respiratory: Effort normal and breath sounds normal.  GI: Soft. Bowel sounds are normal.  Neurological: She is alert and oriented to person, place, and time.  Skin: Skin is warm and dry.  Psychiatric: She has a normal mood and affect.    Encounter Medications:   Outpatient Encounter Prescriptions as of 01/04/2016  Medication Sig Note  . acetaminophen (TYLENOL) 500 MG tablet Take 500 mg by mouth every 6 (six) hours as needed for mild pain.   Marland Kitchen albuterol (PROVENTIL) (2.5 MG/3ML) 0.083% nebulizer solution USE 1 VIAL (3 ML) VIA NEBULIZER EVERY 6 HOURS AS NEEDED FOR WHEEZING OR SHORTNESS OF BREATH (Patient taking differently: USE 1 VIAL (3 ML) VIA NEBULIZER THREE TIMES DAILY)   . amLODipine (NORVASC) 5 MG tablet Take 1 tablet (5 mg total) by mouth daily.   Marland Kitchen BREO ELLIPTA 100-25 MCG/INH AEPB USE 1 INHALATION DAILY (Patient taking differently: USE 1 INHALATION EVERY  EVENING)   . dicyclomine (BENTYL) 20 MG tablet Take 1 tablet (20 mg total) by mouth 4 (four) times daily -  before meals and at bedtime. This is a short supply until mail order comes in   . ENSURE PLUS (ENSURE PLUS) LIQD Take 237 mLs by mouth 2 (two) times daily between meals. For weight gain   . HYDROcodone-acetaminophen (NORCO) 10-325 MG tablet Take 1 tablet by mouth every 6 (six) hours as needed.   Marland Kitchen KLOR-CON M10 10 MEQ tablet  12/15/2015: Received from: External Pharmacy  . Linaclotide (LINZESS) 290 MCG CAPS capsule Take 1 capsule (290 mcg total) by mouth daily.   Marland Kitchen LORazepam (ATIVAN) 1 MG tablet Take 1 tablet (1 mg total) by mouth daily as needed for anxiety or sleep.   Marland Kitchen losartan (COZAAR) 100 MG tablet Take 1 tablet (100 mg total) by mouth daily.   . magnesium oxide (MAG-OX) 400 (241.3 Mg) MG tablet Take 1 tablet (400 mg total) by mouth daily.   . metoprolol succinate (TOPROL-XL) 50 MG 24 hr tablet Take 50 mg by mouth daily. 12/03/2015: Pt could not recall time of last dose - usually between 8am and 12pm  . Multiple Vitamin (MULTIVITAMIN WITH MINERALS) TABS tablet Take 1 tablet by mouth daily. Pt uses One-A-Day brand   . omeprazole (PRILOSEC) 40 MG capsule TAKE 1 CAPSULE DAILY FOR GASTROINTESTINAL ULCER   . ondansetron (ZOFRAN-ODT) 8 MG disintegrating tablet TAKE 1 TABLET EVERY 8 HOURS AS NEEDED FOR NAUSEA   . OXYGEN Inhale 4 L into the  lungs continuous.   Marland Kitchen PARoxetine (PAXIL) 10 MG tablet  12/15/2015: Received from: External Pharmacy  . PROAIR HFA 108 (90 Base) MCG/ACT inhaler USE 2 TO 4 INHALATIONS EVERY 4 TO 6 HOURS AS NEEDED (Patient taking differently: USE 2 TO 4 INHALATIONS EVERY 4 TO 6 HOURS AS NEEDED FOR SHORTNESS OF BREATH/WHEEZING)   . traMADol (ULTRAM) 50 MG tablet Take 1 tablet (50 mg total) by mouth every 6 (six) hours as needed (pain).   Marland Kitchen Umeclidinium Bromide (INCRUSE ELLIPTA) 62.5 MCG/INH AEPB Inhale 1 Inhaler into the lungs daily. (Patient taking differently: Inhale 1 puff into  the lungs daily. )   . [DISCONTINUED] promethazine (PHENERGAN) 25 MG tablet Take 1 tablet (25 mg total) by mouth every 6 (six) hours as needed for nausea or vomiting.    No facility-administered encounter medications on file as of 01/04/2016.    Functional Status:   In your present state of health, do you have any difficulty performing the following activities: 12/16/2015 12/02/2015  Hearing? Y N  Vision? Y N  Difficulty concentrating or making decisions? N N  Walking or climbing stairs? N Y  Dressing or bathing? Y N  Doing errands, shopping? Y N  Preparing Food and eating ? Y -  Using the Toilet? N -  In the past six months, have you accidently leaked urine? N -  Do you have problems with loss of bowel control? N -  Managing your Medications? N -  Managing your Finances? N -  Housekeeping or managing your Housekeeping? Y -    Fall/Depression Screening:    PHQ 2/9 Scores 12/16/2015 12/12/2015 02/08/2014 09/25/2012  PHQ - 2 Score 2 0 0 0  PHQ- 9 Score 10 - - -    Assessment:  Abdominal pain, nausea, wt loss                         COPD  Plan:  See plan of care. I will return in 5 weeks.            Called specialist office for an earlier appt.  THN CM Care Plan Problem One        Most Recent Value   Care Plan Problem One  Low weight and abdominal pain   Role Documenting the Problem One  Care Management [place] for Problem One  Active   THN Long Term Goal (31-90 days)  Pt wt will not be lower than it is today by the end of 90 days.   THN Long Term Goal Start Date  12/16/15   Interventions for Problem One Long Term Goal  Pt has lost 3# down to 76 from 79. Encouraged intake as much as she can tolerate. Drink ensure.   THN CM Short Term Goal #1 (0-30 days)  Pt will start new medication as directed over the next 30 days.   THN CM Short Term Goal #1 Start Date  12/16/15   [facility] CM Short Term Goal #1 Met Date  01/04/16   Interventions for Short Term Goal #1  No difference  in her abdominal discomfort with Bentyl. Encouraged to go to new specialist visit in September.   THN CM Short Term Goal #2 (0-30 days)  Pt will weigh weekly and record over the next 30 days.   THN CM Short Term Goal #2 Start Date  12/16/15   Interventions for Short Term Goal #2  Pt is weighing weekly.   THN  CM Short Term Goal #3 (0-30 days)  Pt will drink 3 cans of ensure plus if unable to eat to eat solid food.   THN CM Short Term Goal #3 Start Date  12/16/15   Interventions for Short Tern Goal #3  Not able to drink 3 cans every day due to nausea, abdominal pain.    [facility] CM Care Plan Problem Two        Most Recent Value   Care Plan Problem Two  COPD   Role Documenting the Problem Two  Care Management Coordinator   Care Plan for Problem Two  Active   Interventions for Problem Two Long Term Goal   Follows COPD action plan. No hosptializations or ED visit.   THN Long Term Goal (31-90) days  Pt will not go to hospital in the next 90 days for COPD   THN Long Term Goal Start Date  12/16/15   THN CM Short Term Goal #1 (0-30 days)  Pt will call me if she goes into the yellow zone.   THN CM Short Term Goal #1 Start Date  12/16/15   Interventions for Short Term Goal #2   Reviewed need to call NP if she goes into the yellow zone.   THN CM Short Term Goal #2 (0-30 days)  Pt will abstain from smoking and use her ecig   THN CM Short Term Goal #2 Start Date  12/16/15   Interventions for Short Term Goal #2  Encouaraged her to abstain.     Deloria Lair [facility] [place] [phone]

## 2016-01-19 ENCOUNTER — Other Ambulatory Visit: Payer: Self-pay | Admitting: *Deleted

## 2016-01-19 MED ORDER — FLUTICASONE FUROATE-VILANTEROL 100-25 MCG/INH IN AEPB: INHALATION_SPRAY | RESPIRATORY_TRACT | Status: DC

## 2016-01-30 ENCOUNTER — Other Ambulatory Visit: Payer: Self-pay | Admitting: *Deleted

## 2016-01-30 MED ORDER — LOSARTAN POTASSIUM 100 MG PO TABS: 100.0000 mg | ORAL_TABLET | Freq: Every day | ORAL | Status: DC

## 2016-01-30 MED ORDER — METOPROLOL SUCCINATE ER 50 MG PO TB24: 50.0000 mg | ORAL_TABLET | Freq: Every day | ORAL | Status: DC

## 2016-01-30 MED ORDER — KLOR-CON M10 10 MEQ PO TBCR: 10.0000 meq | EXTENDED_RELEASE_TABLET | Freq: Once | ORAL | Status: DC

## 2016-01-30 MED ORDER — ONDANSETRON 8 MG PO TBDP: ORAL_TABLET | ORAL | Status: DC

## 2016-02-01 ENCOUNTER — Other Ambulatory Visit: Payer: Self-pay | Admitting: *Deleted

## 2016-02-01 MED ORDER — KLOR-CON M10 10 MEQ PO TBCR: 10.0000 meq | EXTENDED_RELEASE_TABLET | Freq: Every day | ORAL | Status: DC

## 2016-02-02 ENCOUNTER — Other Ambulatory Visit: Payer: Self-pay

## 2016-02-02 MED ORDER — HYDROCODONE-ACETAMINOPHEN 10-325 MG PO TABS: 1.0000 | ORAL_TABLET | Freq: Four times a day (QID) | ORAL | Status: DC | PRN

## 2016-02-07 ENCOUNTER — Other Ambulatory Visit: Payer: Self-pay | Admitting: *Deleted

## 2016-02-07 ENCOUNTER — Encounter: Payer: Self-pay | Admitting: *Deleted

## 2016-02-07 NOTE — Patient Outreach (Signed)
[facility]) Care Management   02/07/2016  Tamara Adams 1940-10-21 [id number]  Tamara Adams is an 75 y.o. female  Subjective: Pt  Doing fair. She has gained 2#. She continues to be in constant pain although she does get her level down to a tolerable level with pain med. She drinks 2 ensure a day and other foods she has found she can tolerate.  She did not hear back from the GI specialty office for an appt sooner than September 9.  Objective:   Review of Systems  HENT: Negative.   Eyes: Negative.   Respiratory: Positive for shortness of breath.   Cardiovascular: Negative.   Gastrointestinal: Positive for nausea, abdominal pain, diarrhea and constipation.  Genitourinary: Negative.   Musculoskeletal: Negative.   Skin: Negative.   Neurological: Positive for weakness.  Endo/Heme/Allergies: Negative.   Psychiatric/Behavioral: Negative.    BP 160/72 mmHg  Pulse 74  Resp 20  Wt 78 lb (35.381 kg)  SpO2 98%  Physical Exam  Constitutional: She is oriented to person, place, and time.  Cardiovascular: Normal rate, regular rhythm and normal heart sounds.   Respiratory: Effort normal and breath sounds normal.  O2 at 5L per [place]  GI: Soft. Bowel sounds are normal.  Musculoskeletal: Normal range of motion.  Neurological: She is alert and oriented to person, place, and time.  Skin: Skin is warm and dry.  Psychiatric: She has a normal mood and affect.    Encounter Medications:   Outpatient Encounter Prescriptions as of 02/07/2016  Medication Sig Note  . acetaminophen (TYLENOL) 500 MG tablet Take 500 mg by mouth every 6 (six) hours as needed for mild pain.   Marland Kitchen albuterol (PROVENTIL) (2.5 MG/3ML) 0.083% nebulizer solution USE 1 VIAL (3 ML) VIA NEBULIZER EVERY 6 HOURS AS NEEDED FOR WHEEZING OR SHORTNESS OF BREATH (Patient taking differently: USE 1 VIAL (3 ML) VIA NEBULIZER THREE TIMES DAILY)   . amLODipine (NORVASC) 5 MG tablet Take 1 tablet (5 mg total) by mouth daily.   Marland Kitchen  dicyclomine (BENTYL) 20 MG tablet Take 1 tablet (20 mg total) by mouth 4 (four) times daily -  before meals and at bedtime. This is a short supply until mail order comes in   . ENSURE PLUS (ENSURE PLUS) LIQD Take 237 mLs by mouth 2 (two) times daily between meals. For weight gain   . fluticasone furoate-vilanterol (BREO ELLIPTA) 100-25 MCG/INH AEPB USE 1 INHALATION DAILY   . HYDROcodone-acetaminophen (NORCO) 10-325 MG tablet Take 1 tablet by mouth every 6 (six) hours as needed.   Marland Kitchen KLOR-CON M10 10 MEQ tablet Take 1 tablet (10 mEq total) by mouth daily.   . Linaclotide (LINZESS) 290 MCG CAPS capsule Take 1 capsule (290 mcg total) by mouth daily.   Marland Kitchen LORazepam (ATIVAN) 1 MG tablet Take 1 tablet (1 mg total) by mouth daily as needed for anxiety or sleep.   Marland Kitchen losartan (COZAAR) 100 MG tablet Take 1 tablet (100 mg total) by mouth daily.   . magnesium oxide (MAG-OX) 400 (241.3 Mg) MG tablet Take 1 tablet (400 mg total) by mouth daily.   . metoprolol succinate (TOPROL-XL) 50 MG 24 hr tablet Take 1 tablet (50 mg total) by mouth daily.   . Multiple Vitamin (MULTIVITAMIN WITH MINERALS) TABS tablet Take 1 tablet by mouth daily. Pt uses One-A-Day brand   . omeprazole (PRILOSEC) 40 MG capsule TAKE 1 CAPSULE DAILY FOR GASTROINTESTINAL ULCER   . ondansetron (ZOFRAN-ODT) 8 MG disintegrating tablet TAKE 1 TABLET EVERY 8  HOURS AS NEEDED FOR NAUSEA   . OXYGEN Inhale 4 L into the lungs continuous.   Marland Kitchen PARoxetine (PAXIL) 10 MG tablet  12/15/2015: Received from: External Pharmacy  . PROAIR HFA 108 (90 Base) MCG/ACT inhaler USE 2 TO 4 INHALATIONS EVERY 4 TO 6 HOURS AS NEEDED (Patient taking differently: USE 2 TO 4 INHALATIONS EVERY 4 TO 6 HOURS AS NEEDED FOR SHORTNESS OF BREATH/WHEEZING)   . traMADol (ULTRAM) 50 MG tablet Take 1 tablet (50 mg total) by mouth every 6 (six) hours as needed (pain).   Marland Kitchen Umeclidinium Bromide (INCRUSE ELLIPTA) 62.5 MCG/INH AEPB Inhale 1 Inhaler into the lungs daily. (Patient taking differently:  Inhale 1 puff into the lungs daily. )    No facility-administered encounter medications on file as of 02/07/2016.    Functional Status:   In your present state of health, do you have any difficulty performing the following activities: 12/16/2015 12/02/2015  Hearing? Y N  Vision? Y N  Difficulty concentrating or making decisions? N N  Walking or climbing stairs? N Y  Dressing or bathing? Y N  Doing errands, shopping? Y N  Preparing Food and eating ? Y -  Using the Toilet? N -  In the past six months, have you accidently leaked urine? N -  Do you have problems with loss of bowel control? N -  Managing your Medications? N -  Managing your Finances? N -  Housekeeping or managing your Housekeeping? Y -    Fall/Depression Screening:    PHQ 2/9 Scores 12/16/2015 12/12/2015 02/08/2014 09/25/2012  PHQ - 2 Score 2 0 0 0  PHQ- 9 Score 10 - - -    Assessment:  Abdominal pain & wt loss                         COPD  Plan: [facility] and we were able to get an appt for 02/20/16 at 12:30.  THN CM Care Plan Problem One        Most Recent Value   Care Plan Problem One  Low weight and abdominal pain   Role Documenting the Problem One  Care Management [place] for Problem One  Active   THN Long Term Goal (31-90 days)  Pt wt will not be lower than it is today by the end of 90 days.   THN Long Term Goal Start Date  12/16/15   Interventions for Problem One Long Term Goal  Called [facility] again to request earlier appt and they gave her appt for 7/31 moved up from 9/8!   THN CM Short Term Goal #1 (0-30 days)  Pt will start new medication as directed over the next 30 days.   THN CM Short Term Goal #1 Start Date  12/16/15   THN CM Short Term Goal #1 Met Date  01/04/16   THN CM Short Term Goal #2 (0-30 days)  Pt will weigh weekly and record over the next 30 days.   THN CM Short Term Goal #2 Start Date  12/16/15   THN CM Short Term Goal #2 Met Date  02/07/16   THN CM Short Term Goal #3  (0-30 days)  Pt will drink 3 cans of ensure plus if unable to eat to eat solid food.   THN CM Short Term Goal #3 Start Date  12/16/15   Interventions for Short Tern Goal #3  Today we discussed the possibility of a gastrostomy tube feeding  that may help her to maintain or gain wt. She will discuss this with the specialist.    [facility] CM Care Plan Problem Two        Most Recent Value   Care Plan Problem Two  COPD   Role Documenting the Problem Two  Care Management [place] for Problem Two  Active   Interventions for Problem Two Long Term Goal   Follows COPD action plan. No hosptializations or ED visit.   THN Long Term Goal (31-90) days  Pt will not go to hospital in the next 90 days for COPD   THN Long Term Goal Start Date  12/16/15   THN CM Short Term Goal #1 (0-30 days)  Pt will call me if she goes into the yellow zone.   THN CM Short Term Goal #1 Start Date  12/16/15   [facility] CM Short Term Goal #1 Met Date   02/07/16   Interventions for Short Term Goal #2   Encouraged her that she can call me with any problelm.   THN CM Short Term Goal #2 (0-30 days)  Pt will abstain from smoking and use her ecig   THN CM Short Term Goal #2 Start Date  12/16/15   Interventions for Short Term Goal #2  Wished her luck in continuing her efforts to abstain.     I will see pt again in a month.  Deloria Lair [facility] [place] [phone]

## 2016-02-14 ENCOUNTER — Ambulatory Visit: Payer: Self-pay | Admitting: Family Medicine

## 2016-02-16 ENCOUNTER — Encounter: Payer: Self-pay | Admitting: Family Medicine

## 2016-02-16 ENCOUNTER — Telehealth: Payer: Self-pay | Admitting: Family Medicine

## 2016-02-16 ENCOUNTER — Ambulatory Visit (INDEPENDENT_AMBULATORY_CARE_PROVIDER_SITE_OTHER): Payer: Medicare Other | Admitting: Family Medicine

## 2016-02-16 VITALS — BP 187/73 | HR 72 | Wt 79.0 lb

## 2016-02-16 DIAGNOSIS — R7309 Other abnormal glucose: Secondary | ICD-10-CM | POA: Diagnosis not present

## 2016-02-16 DIAGNOSIS — G8929 Other chronic pain: Secondary | ICD-10-CM | POA: Diagnosis not present

## 2016-02-16 DIAGNOSIS — R109 Unspecified abdominal pain: Secondary | ICD-10-CM | POA: Diagnosis not present

## 2016-02-16 DIAGNOSIS — J439 Emphysema, unspecified: Secondary | ICD-10-CM

## 2016-02-16 DIAGNOSIS — R636 Underweight: Secondary | ICD-10-CM | POA: Diagnosis not present

## 2016-02-16 LAB — POCT GLYCOSYLATED HEMOGLOBIN (HGB A1C): HEMOGLOBIN A1C: 5.6

## 2016-02-16 MED ORDER — DICYCLOMINE HCL 20 MG PO TABS: 20.0000 mg | ORAL_TABLET | Freq: Three times a day (TID) | ORAL | 0 refills | Status: DC

## 2016-02-16 NOTE — Telephone Encounter (Addendum)
Call pt: I did check. They did a scan at [facility] in 2015 to look for mesenteric ischemia, dec blood flow to the gut/bowels, and it was normal.   Also needs to come back in for BP check with nurse in 1-2 weeks.

## 2016-02-16 NOTE — Progress Notes (Signed)
Subjective:    CC: Follow-up chronic recurrent abdominal pain  HPI:  Tamara Adams is a 75 year old female with a history of small bowel obstruction status post surgery 2. She now has chronic abdominal pain. We sited to try dicyclomine on I last saw her 20 mg each not at bedtime. If she felt it was too much and she could certainly cut it in half. Says she is controlled with a nausea specialist with the GI department at [facility] next week. She also wanted to know if she's ever been tested for pancreatitis because someone mention this to her.  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  She says she had to turn her oxygen up to 5 L about 2 weeks ago when it started getting really hot. She denies any cough or change in sputum production.  Underweight- BMI 13. Her weight is stable.  Past medical history, Surgical history, Family history not pertinant except as noted below, Social history, Allergies, and medications have been entered into the medical record, reviewed, and corrections made.   Review of Systems: No fevers, chills, night sweats, weight loss, chest pain.   Objective:    General: Well Developed, well nourished, and in no acute distress.  Neuro: Alert and oriented x3, extra-ocular muscles intact, sensation grossly intact.  HEENT: Normocephalic, atraumatic  Skin: Warm and dry, no rashes. Cardiac: Regular rate and rhythm, no murmurs rubs or gallops, no lower extremity edema.  Respiratory: Clear to auscultation bilaterally. Not using accessory muscles, speaking in full sentences.   Impression and Recommendations:   Abdominal pain-continue with dicyclomine. She's not sure if it's helping or not but she says she's now scared to stop it in case her symptoms actually get worse. She does have an appointment with GI next Monday at [facility] whose cannot focus particularly on nausea. She still use the hydrocodone occasionally and says that does provide her several hours of relief when she takes  it she says sometimes she just can't stand it anymore and feels like she needs to take something. She would like to have pancreatic enzymes checked. So we'll order amylase and lipase. t  Underweight/BMI 13-encouraged her to continue to work on getting nutrition in. She says she does eat she just doesn't eat a lot.  COPD-continue to monitor. She has had an increase in symptoms but no cough or sputum production at this point time. Her pulse ox is 100%. Today's we'll continue to monitor this carefully. Lung exam clear today. Continue INcruse and Bre and PRN albutrerol.

## 2016-02-17 LAB — LIPASE: LIPASE: 36 U/L (ref 7–60)

## 2016-02-17 LAB — AMYLASE: AMYLASE: 65 U/L (ref 0–105)

## 2016-02-17 LAB — MAGNESIUM: Magnesium: 1.7 mg/dL (ref 1.5–2.5)

## 2016-02-17 NOTE — Telephone Encounter (Signed)
Patient notified

## 2016-02-21 ENCOUNTER — Telehealth: Payer: Self-pay | Admitting: Family Medicine

## 2016-02-21 ENCOUNTER — Other Ambulatory Visit: Payer: Self-pay | Admitting: *Deleted

## 2016-02-21 MED ORDER — PAROXETINE HCL 20 MG PO TABS: 20.0000 mg | ORAL_TABLET | Freq: Every day | ORAL | 0 refills | Status: DC

## 2016-02-21 MED ORDER — AMLODIPINE BESYLATE 5 MG PO TABS: 5.0000 mg | ORAL_TABLET | Freq: Every day | ORAL | 0 refills | Status: DC

## 2016-02-21 NOTE — Telephone Encounter (Signed)
Most recent Rx in our system and also in care everywhere was '20mg'$  qd.

## 2016-02-21 NOTE — Telephone Encounter (Signed)
Med refilled and faxed.Tamara Adams

## 2016-02-22 ENCOUNTER — Other Ambulatory Visit: Payer: Self-pay | Admitting: *Deleted

## 2016-02-22 DIAGNOSIS — Z9889 Other specified postprocedural states: Secondary | ICD-10-CM | POA: Diagnosis not present

## 2016-02-22 DIAGNOSIS — Z9981 Dependence on supplemental oxygen: Secondary | ICD-10-CM | POA: Diagnosis not present

## 2016-02-22 DIAGNOSIS — Z79891 Long term (current) use of opiate analgesic: Secondary | ICD-10-CM | POA: Diagnosis not present

## 2016-02-22 DIAGNOSIS — K5909 Other constipation: Secondary | ICD-10-CM | POA: Diagnosis not present

## 2016-02-22 DIAGNOSIS — R112 Nausea with vomiting, unspecified: Secondary | ICD-10-CM | POA: Diagnosis not present

## 2016-02-22 DIAGNOSIS — M6258 Muscle wasting and atrophy, not elsewhere classified, other site: Secondary | ICD-10-CM | POA: Diagnosis not present

## 2016-02-22 DIAGNOSIS — R1013 Epigastric pain: Secondary | ICD-10-CM | POA: Diagnosis not present

## 2016-02-22 DIAGNOSIS — J449 Chronic obstructive pulmonary disease, unspecified: Secondary | ICD-10-CM | POA: Diagnosis not present

## 2016-02-22 DIAGNOSIS — Z79899 Other long term (current) drug therapy: Secondary | ICD-10-CM | POA: Diagnosis not present

## 2016-02-22 DIAGNOSIS — G8929 Other chronic pain: Secondary | ICD-10-CM

## 2016-02-22 DIAGNOSIS — R197 Diarrhea, unspecified: Secondary | ICD-10-CM | POA: Diagnosis not present

## 2016-02-22 DIAGNOSIS — R109 Unspecified abdominal pain: Principal | ICD-10-CM

## 2016-02-22 DIAGNOSIS — Z7951 Long term (current) use of inhaled steroids: Secondary | ICD-10-CM | POA: Diagnosis not present

## 2016-02-22 MED ORDER — DICYCLOMINE HCL 20 MG PO TABS: 20.0000 mg | ORAL_TABLET | Freq: Three times a day (TID) | ORAL | 1 refills | Status: DC

## 2016-02-22 MED ORDER — TRAMADOL HCL 50 MG PO TABS: 50.0000 mg | ORAL_TABLET | Freq: Four times a day (QID) | ORAL | 3 refills | Status: DC | PRN

## 2016-02-22 MED ORDER — ONDANSETRON 8 MG PO TBDP: ORAL_TABLET | ORAL | 1 refills | Status: DC

## 2016-02-22 MED ORDER — LORAZEPAM 1 MG PO TABS: 1.0000 mg | ORAL_TABLET | Freq: Every day | ORAL | 1 refills | Status: DC | PRN

## 2016-02-28 DIAGNOSIS — R1013 Epigastric pain: Secondary | ICD-10-CM | POA: Diagnosis not present

## 2016-03-08 ENCOUNTER — Other Ambulatory Visit: Payer: Self-pay | Admitting: *Deleted

## 2016-03-08 VITALS — BP 100/60 | HR 62 | Resp 20 | Wt 78.0 lb

## 2016-03-08 DIAGNOSIS — E46 Unspecified protein-calorie malnutrition: Secondary | ICD-10-CM

## 2016-03-08 NOTE — Patient Outreach (Addendum)
[facility]) Care Management   03/08/2016  Tamara Adams 02-Apr-1941 [id number]  Tamara Adams is an 75 y.o. female  Subjective: Pt had GI specialist. She had an ultrasound and is scheduled for another diagnositic test at the end of the month. They discussed a feeding tube but the decision is asl long as her weight is stablized no need for this intervention. Goal wt set for 100#. She is drinking 3 Ensure Plus most days and some solid food. She has also seen Dr. Madilyn Fireman.   Objective:   Review of Systems  HENT: Negative.   Eyes: Negative.   Respiratory: Positive for shortness of breath.   Cardiovascular: Negative.   Gastrointestinal: Positive for nausea.       Abdominal pain.  Genitourinary: Negative.   Musculoskeletal: Negative.   Skin: Negative.   Neurological: Positive for weakness.  Endo/Heme/Allergies: Negative.   Psychiatric/Behavioral: Negative.    BP 100/60 (BP Location: Left Arm, Patient Position: Sitting, Cuff Size: Normal)   Pulse 62   Resp 20   Wt 78 lb (35.4 kg)   SpO2 90%   BMI 13.82 kg/m   Physical Exam  Constitutional:  Frail, emaciated female.  Cardiovascular: Intact distal pulses.   Respiratory: She has wheezes.  Inspiratory and expiratory.  GI: Soft. Bowel sounds are normal. There is tenderness.  Skin: Skin is warm and dry.  Psychiatric: She has a normal mood and affect.    Encounter Medications:   Outpatient Encounter Prescriptions as of 03/08/2016  Medication Sig  . acetaminophen (TYLENOL) 500 MG tablet Take 500 mg by mouth every 6 (six) hours as needed for mild pain.  Marland Kitchen albuterol (PROVENTIL) (2.5 MG/3ML) 0.083% nebulizer solution USE 1 VIAL (3 ML) VIA NEBULIZER EVERY 6 HOURS AS NEEDED FOR WHEEZING OR SHORTNESS OF BREATH (Patient taking differently: USE 1 VIAL (3 ML) VIA NEBULIZER THREE TIMES DAILY)  . amLODipine (NORVASC) 5 MG tablet Take 1 tablet (5 mg total) by mouth daily.  Marland Kitchen dicyclomine (BENTYL) 20 MG tablet Take 1 tablet (20 mg  total) by mouth 4 (four) times daily -  before meals and at bedtime.  . ENSURE PLUS (ENSURE PLUS) LIQD Take 237 mLs by mouth 2 (two) times daily between meals. For weight gain  . fluticasone furoate-vilanterol (BREO ELLIPTA) 100-25 MCG/INH AEPB USE 1 INHALATION DAILY  . HYDROcodone-acetaminophen (NORCO) 10-325 MG tablet Take 1 tablet by mouth every 6 (six) hours as needed.  Marland Kitchen KLOR-CON M10 10 MEQ tablet Take 1 tablet (10 mEq total) by mouth daily.  . Linaclotide (LINZESS) 290 MCG CAPS capsule Take 1 capsule (290 mcg total) by mouth daily.  Marland Kitchen LORazepam (ATIVAN) 1 MG tablet Take 1 tablet (1 mg total) by mouth daily as needed for anxiety or sleep.  Marland Kitchen losartan (COZAAR) 100 MG tablet Take 1 tablet (100 mg total) by mouth daily.  . magnesium oxide (MAG-OX) 400 (241.3 Mg) MG tablet Take 1 tablet (400 mg total) by mouth daily.  . metoprolol succinate (TOPROL-XL) 50 MG 24 hr tablet Take 1 tablet (50 mg total) by mouth daily.  . Multiple Vitamin (MULTIVITAMIN WITH MINERALS) TABS tablet Take 1 tablet by mouth daily. Pt uses One-A-Day brand  . omeprazole (PRILOSEC) 40 MG capsule TAKE 1 CAPSULE DAILY FOR GASTROINTESTINAL ULCER  . ondansetron (ZOFRAN-ODT) 8 MG disintegrating tablet TAKE 1 TABLET EVERY 8 HOURS AS NEEDED FOR NAUSEA  . OXYGEN Inhale 4 L into the lungs continuous.  Marland Kitchen PARoxetine (PAXIL) 20 MG tablet Take 1 tablet (20 mg total) by  mouth daily.  Marland Kitchen PROAIR HFA 108 (90 Base) MCG/ACT inhaler USE 2 TO 4 INHALATIONS EVERY 4 TO 6 HOURS AS NEEDED (Patient taking differently: USE 2 TO 4 INHALATIONS EVERY 4 TO 6 HOURS AS NEEDED FOR SHORTNESS OF BREATH/WHEEZING)  . traMADol (ULTRAM) 50 MG tablet Take 1 tablet (50 mg total) by mouth every 6 (six) hours as needed (pain).  Marland Kitchen Umeclidinium Bromide (INCRUSE ELLIPTA) 62.5 MCG/INH AEPB Inhale 1 Inhaler into the lungs daily. (Patient taking differently: Inhale 1 puff into the lungs daily. )   No facility-administered encounter medications on file as of 03/08/2016.      Functional Status:   In your present state of health, do you have any difficulty performing the following activities: 12/16/2015 12/02/2015  Hearing? Y N  Vision? Y N  Difficulty concentrating or making decisions? N N  Walking or climbing stairs? N Y  Dressing or bathing? Y N  Doing errands, shopping? Y N  Preparing Food and eating ? Y -  Using the Toilet? N -  In the past six months, have you accidently leaked urine? N -  Do you have problems with loss of bowel control? N -  Managing your Medications? N -  Managing your Finances? N -  Housekeeping or managing your Housekeeping? Y -  Some recent data might be hidden    Fall/Depression Screening:    PHQ 2/9 Scores 12/16/2015 12/12/2015 02/08/2014 09/25/2012  PHQ - 2 Score 2 0 0 0  PHQ- 9 Score 10 - - -    Assessment:  Abdominal pain                         Weight Loss                         COPD  Plan: Will see if I can order ensure plus so that her insurance will pay for this since this is her main source of nourishment.           Pt seems to be stable but continues to be very frail. I will call her next month to evaluate her status and visit only if necessary.  THN CM Care Plan Problem One   Flowsheet Row Most Recent Value  Care Plan Problem One  (P) Low weight and abdominal pain  Role Documenting the Problem One  (P) Care Management [place] for Problem One  (P) Active  THN Long Term Goal (31-90 days)  (P) Pt wt will not be lower than it is today by the end of 90 days.  THN Long Term Goal Start Date  (P) 12/16/15  Interventions for Problem One Long Term Goal  (P) [facility] to request earlier appt and they gave her appt for 7/31 moved up from 9/8!  THN CM Short Term Goal #1 (0-30 days)  (P) Pt will start new medication as directed over the next 30 days.  THN CM Short Term Goal #1 Start Date  (P) 12/16/15  THN CM Short Term Goal #1 Met Date  (P) 01/04/16  THN CM Short Term Goal #2 (0-30 days)   (P) Pt will weigh weekly and record over the next 30 days.  THN CM Short Term Goal #2 Start Date  (P) 12/16/15  THN CM Short Term Goal #2 Met Date  (P) 02/07/16  THN CM Short Term Goal #3 (0-30 days)  (P) Pt will drink 3  cans of ensure plus if unable to eat to eat solid food.  THN CM Short Term Goal #3 Start Date  (P) 12/16/15  Interventions for Short Tern Goal #3  (P) Today we discussed the possibility of a gastrostomy tube feeding that may help her to maintain or gain wt. She will discuss this with the specialist.    [facility] CM Care Plan Problem Two   Flowsheet Row Most Recent Value  Care Plan Problem Two  (P) COPD  Role Documenting the Problem Two  (P) Care Management [place] for Problem Two  (P) Active  Interventions for Problem Two Long Term Goal   (P) Follows COPD action plan. No hosptializations or ED visit.  THN Long Term Goal (31-90) days  (P) Pt will not go to hospital in the next 90 days for COPD  THN Long Term Goal Start Date  (P) 12/16/15  THN CM Short Term Goal #1 (0-30 days)  (P) Pt will call me if she goes into the yellow zone.  THN CM Short Term Goal #1 Start Date  (P) 12/16/15  THN CM Short Term Goal #1 Met Date   (P) 02/07/16  Interventions for Short Term Goal #2   (P) Encouraged her that she can call me with any problelm.  THN CM Short Term Goal #2 (0-30 days)  (P) Pt will abstain from smoking and use her ecig  THN CM Short Term Goal #2 Start Date  (P) 12/16/15  Interventions for Short Term Goal #2  (P) Wished her luck in continuing her efforts to abstain.     Deloria Lair [facility] [place] [phone]

## 2016-03-09 NOTE — Progress Notes (Signed)
Please call pt and see if she would be interested in a referral to see a nutritionist here locally.

## 2016-03-14 NOTE — Progress Notes (Signed)
Patient agreed to the referral. What would be the diagnosis?

## 2016-03-14 NOTE — Progress Notes (Signed)
Underweight, malnourished.

## 2016-03-15 NOTE — Progress Notes (Signed)
Referral ordered

## 2016-03-15 NOTE — Addendum Note (Signed)
Addended by: Narda Rutherford on: 03/15/2016 02:59 PM   Modules accepted: Orders

## 2016-03-20 DIAGNOSIS — R143 Flatulence: Secondary | ICD-10-CM | POA: Diagnosis not present

## 2016-03-20 DIAGNOSIS — R112 Nausea with vomiting, unspecified: Secondary | ICD-10-CM | POA: Diagnosis not present

## 2016-03-20 DIAGNOSIS — Z9049 Acquired absence of other specified parts of digestive tract: Secondary | ICD-10-CM | POA: Diagnosis not present

## 2016-03-20 DIAGNOSIS — R197 Diarrhea, unspecified: Secondary | ICD-10-CM | POA: Diagnosis not present

## 2016-03-20 DIAGNOSIS — R1013 Epigastric pain: Secondary | ICD-10-CM | POA: Diagnosis not present

## 2016-03-20 DIAGNOSIS — R140 Abdominal distension (gaseous): Secondary | ICD-10-CM | POA: Diagnosis not present

## 2016-03-20 DIAGNOSIS — R634 Abnormal weight loss: Secondary | ICD-10-CM | POA: Diagnosis not present

## 2016-03-20 DIAGNOSIS — K5900 Constipation, unspecified: Secondary | ICD-10-CM | POA: Diagnosis not present

## 2016-03-24 ENCOUNTER — Telehealth: Payer: Medicare Other | Admitting: Adult Health

## 2016-03-24 ENCOUNTER — Inpatient Hospital Stay (HOSPITAL_BASED_OUTPATIENT_CLINIC_OR_DEPARTMENT_OTHER)
Admission: EM | Admit: 2016-03-24 | Discharge: 2016-03-28 | DRG: 871 | Disposition: A | Discharge status: Home Health | Payer: Medicare Other | Attending: Family Medicine | Admitting: Family Medicine

## 2016-03-24 ENCOUNTER — Other Ambulatory Visit: Payer: Self-pay

## 2016-03-24 ENCOUNTER — Encounter (HOSPITAL_BASED_OUTPATIENT_CLINIC_OR_DEPARTMENT_OTHER): Payer: Self-pay | Admitting: Emergency Medicine

## 2016-03-24 ENCOUNTER — Emergency Department (HOSPITAL_BASED_OUTPATIENT_CLINIC_OR_DEPARTMENT_OTHER): Payer: Medicare Other

## 2016-03-24 DIAGNOSIS — G8929 Other chronic pain: Secondary | ICD-10-CM | POA: Diagnosis present

## 2016-03-24 DIAGNOSIS — F329 Major depressive disorder, single episode, unspecified: Secondary | ICD-10-CM | POA: Diagnosis present

## 2016-03-24 DIAGNOSIS — Z87891 Personal history of nicotine dependence: Secondary | ICD-10-CM

## 2016-03-24 DIAGNOSIS — E785 Hyperlipidemia, unspecified: Secondary | ICD-10-CM | POA: Diagnosis present

## 2016-03-24 DIAGNOSIS — F419 Anxiety disorder, unspecified: Secondary | ICD-10-CM | POA: Diagnosis present

## 2016-03-24 DIAGNOSIS — Z9049 Acquired absence of other specified parts of digestive tract: Secondary | ICD-10-CM

## 2016-03-24 DIAGNOSIS — J441 Chronic obstructive pulmonary disease with (acute) exacerbation: Secondary | ICD-10-CM | POA: Diagnosis present

## 2016-03-24 DIAGNOSIS — Z86718 Personal history of other venous thrombosis and embolism: Secondary | ICD-10-CM

## 2016-03-24 DIAGNOSIS — R0602 Shortness of breath: Secondary | ICD-10-CM | POA: Diagnosis not present

## 2016-03-24 DIAGNOSIS — K5900 Constipation, unspecified: Secondary | ICD-10-CM | POA: Diagnosis present

## 2016-03-24 DIAGNOSIS — J9621 Acute and chronic respiratory failure with hypoxia: Secondary | ICD-10-CM | POA: Diagnosis present

## 2016-03-24 DIAGNOSIS — Z9841 Cataract extraction status, right eye: Secondary | ICD-10-CM

## 2016-03-24 DIAGNOSIS — T380X5A Adverse effect of glucocorticoids and synthetic analogues, initial encounter: Secondary | ICD-10-CM | POA: Diagnosis present

## 2016-03-24 DIAGNOSIS — D649 Anemia, unspecified: Secondary | ICD-10-CM | POA: Diagnosis present

## 2016-03-24 DIAGNOSIS — Z681 Body mass index (BMI) 19 or less, adult: Secondary | ICD-10-CM | POA: Diagnosis not present

## 2016-03-24 DIAGNOSIS — I248 Other forms of acute ischemic heart disease: Secondary | ICD-10-CM | POA: Diagnosis present

## 2016-03-24 DIAGNOSIS — R739 Hyperglycemia, unspecified: Secondary | ICD-10-CM | POA: Diagnosis not present

## 2016-03-24 DIAGNOSIS — J189 Pneumonia, unspecified organism: Secondary | ICD-10-CM | POA: Diagnosis not present

## 2016-03-24 DIAGNOSIS — Z66 Do not resuscitate: Secondary | ICD-10-CM | POA: Diagnosis present

## 2016-03-24 DIAGNOSIS — R05 Cough: Secondary | ICD-10-CM | POA: Diagnosis not present

## 2016-03-24 DIAGNOSIS — I10 Essential (primary) hypertension: Secondary | ICD-10-CM | POA: Diagnosis present

## 2016-03-24 DIAGNOSIS — J440 Chronic obstructive pulmonary disease with acute lower respiratory infection: Secondary | ICD-10-CM | POA: Diagnosis present

## 2016-03-24 DIAGNOSIS — E162 Hypoglycemia, unspecified: Secondary | ICD-10-CM | POA: Diagnosis not present

## 2016-03-24 DIAGNOSIS — Z7951 Long term (current) use of inhaled steroids: Secondary | ICD-10-CM

## 2016-03-24 DIAGNOSIS — Z961 Presence of intraocular lens: Secondary | ICD-10-CM | POA: Diagnosis present

## 2016-03-24 DIAGNOSIS — R058 Other specified cough: Secondary | ICD-10-CM

## 2016-03-24 DIAGNOSIS — E222 Syndrome of inappropriate secretion of antidiuretic hormone: Secondary | ICD-10-CM | POA: Diagnosis present

## 2016-03-24 DIAGNOSIS — Z9981 Dependence on supplemental oxygen: Secondary | ICD-10-CM

## 2016-03-24 DIAGNOSIS — Z9842 Cataract extraction status, left eye: Secondary | ICD-10-CM

## 2016-03-24 DIAGNOSIS — A419 Sepsis, unspecified organism: Principal | ICD-10-CM | POA: Diagnosis present

## 2016-03-24 DIAGNOSIS — K219 Gastro-esophageal reflux disease without esophagitis: Secondary | ICD-10-CM | POA: Diagnosis present

## 2016-03-24 DIAGNOSIS — R109 Unspecified abdominal pain: Secondary | ICD-10-CM | POA: Diagnosis present

## 2016-03-24 DIAGNOSIS — E876 Hypokalemia: Secondary | ICD-10-CM | POA: Diagnosis present

## 2016-03-24 LAB — CBC
HEMATOCRIT: 33 % — AB (ref 36.0–46.0)
Hemoglobin: 10.9 g/dL — ABNORMAL LOW (ref 12.0–15.0)
MCH: 30.3 pg (ref 26.0–34.0)
MCHC: 33 g/dL (ref 30.0–36.0)
MCV: 91.7 fL (ref 78.0–100.0)
PLATELETS: 339 10*3/uL (ref 150–400)
RBC: 3.6 MIL/uL — AB (ref 3.87–5.11)
RDW: 12.5 % (ref 11.5–15.5)
WBC: 12 10*3/uL — AB (ref 4.0–10.5)

## 2016-03-24 LAB — BRAIN NATRIURETIC PEPTIDE: B Natriuretic Peptide: 140.4 pg/mL — ABNORMAL HIGH (ref 0.0–100.0)

## 2016-03-24 LAB — BASIC METABOLIC PANEL
Anion gap: 10 (ref 5–15)
BUN: 14 mg/dL (ref 6–20)
CHLORIDE: 90 mmol/L — AB (ref 101–111)
CO2: 35 mmol/L — ABNORMAL HIGH (ref 22–32)
Calcium: 8.6 mg/dL — ABNORMAL LOW (ref 8.9–10.3)
Creatinine, Ser: 0.7 mg/dL (ref 0.44–1.00)
Glucose, Bld: 145 mg/dL — ABNORMAL HIGH (ref 65–99)
POTASSIUM: 3.1 mmol/L — AB (ref 3.5–5.1)
SODIUM: 135 mmol/L (ref 135–145)

## 2016-03-24 LAB — TROPONIN I

## 2016-03-24 MED ORDER — AZITHROMYCIN 250 MG PO TABS
500.0000 mg | ORAL_TABLET | Freq: Once | ORAL | Status: AC
  Administered 2016-03-24: 500 mg via ORAL
  Filled 2016-03-24: qty 2

## 2016-03-24 MED ORDER — IPRATROPIUM-ALBUTEROL 0.5-2.5 (3) MG/3ML IN SOLN
3.0000 mL | Freq: Once | RESPIRATORY_TRACT | Status: AC
  Administered 2016-03-24: 3 mL via RESPIRATORY_TRACT
  Filled 2016-03-24: qty 3

## 2016-03-24 MED ORDER — ALBUTEROL SULFATE (2.5 MG/3ML) 0.083% IN NEBU: 5.0000 mg | INHALATION_SOLUTION | Freq: Once | RESPIRATORY_TRACT | Status: DC

## 2016-03-24 MED ORDER — DEXTROSE 5 % IV SOLN
1.0000 g | Freq: Once | INTRAVENOUS | Status: AC
  Administered 2016-03-25: 1 g via INTRAVENOUS
  Filled 2016-03-24: qty 10

## 2016-03-24 MED ORDER — IPRATROPIUM BROMIDE 0.02 % IN SOLN: 0.5000 mg | Freq: Once | RESPIRATORY_TRACT | Status: DC

## 2016-03-24 MED ORDER — POTASSIUM CHLORIDE CRYS ER 20 MEQ PO TBCR
40.0000 meq | EXTENDED_RELEASE_TABLET | Freq: Once | ORAL | Status: AC
  Administered 2016-03-25: 40 meq via ORAL
  Filled 2016-03-24: qty 2

## 2016-03-24 MED ORDER — METHYLPREDNISOLONE SODIUM SUCC 125 MG IJ SOLR
125.0000 mg | Freq: Once | INTRAMUSCULAR | Status: AC
  Administered 2016-03-24: 125 mg via INTRAVENOUS
  Filled 2016-03-24: qty 2

## 2016-03-24 MED ORDER — ALBUTEROL SULFATE (2.5 MG/3ML) 0.083% IN NEBU
2.5000 mg | INHALATION_SOLUTION | Freq: Once | RESPIRATORY_TRACT | Status: AC
  Administered 2016-03-24: 2.5 mg via RESPIRATORY_TRACT
  Filled 2016-03-24: qty 3

## 2016-03-24 NOTE — ED Triage Notes (Signed)
Patient states that she is having a cough and intermittent fever at home. The patient reports that she has had to increase her 02 6 liters.

## 2016-03-24 NOTE — Progress Notes (Signed)
   Based on what you shared with me it looks like you have a serious condition that should be evaluated in a face to face visit. I am concerned that you may have pneumonia and I recommend that you follow up at an urgent care or emergency room for further evaluation.   If you are having a true medical emergency please call 911.  If you need an urgent face to face visit, [facility] has four urgent care centers for your convenience.  The following sites will take your  insurance:    . [facility] Urgent [place] a Provider at this Location  [address] [place], [place] [address] . 10 am to 8 pm Monday-Friday . 12 pm to 8 pm Saturday-Sunday   . [facility] Urgent Care at [place] a Provider at this Location  [place], [place], [place] [address] . 8 am to 8 pm Monday-Friday . 9 am to 6 pm Saturday . 11 am to 6 pm Sunday   . [facility] Urgent Care at [place] Get Driving Directions  [address] [place], [place] [address] . 8 am to 8 pm Monday-Friday . 8 am to 4 pm Saturday-Sunday   . [facility] (a walk in primary care provider)  [place] a Provider at this Location  [place], [place] [address] . 8 am to 8:30 pm Monday-Thursday . 8 am to 6 pm Friday . 8 am to 4 pm Saturday-Sunday   Your e-visit answers were reviewed by a board certified advanced clinical practitioner to complete your personal care plan.  Thank you for using e-Visits.

## 2016-03-24 NOTE — ED Notes (Signed)
Lactic acid and BC x2 declined at this time, per EDP.

## 2016-03-24 NOTE — ED Provider Notes (Signed)
[facility] DE[facility] Provider Note   CSN: [id number] Arrival date & time: 03/24/16  2045 By signing my name below, I, Dyke Brackett, attest that this documentation has been prepared under the direction and in the presence of Dorie Rank, MD . Electronically Signed: Dyke Brackett, Scribe. 03/24/2016. 10:12 PM.   History   Chief Complaint Chief Complaint  Patient presents with  . Cough   HPI Tamara Adams is a 75 y.o. female with hx of COPD, DVT, and HTN who presents to the Emergency Department complaining of worsening, intermittent cough onset 1-1.5 weeks ago. Pt notes associated fatigue, fever, and SOB She also complains of soreness in the "back of my chest". Pt has increased her O2 at home from 4L/min to 6. No alleviating or modifying factors noted. She denies vomiting.   The history is provided by the patient. No language interpreter was used.   Past Medical History:  Diagnosis Date  . Anemia    as a child  . Arthritis    "hands" (12/24/2014)  . Complication of anesthesia    " My blood gas dropped during surgery so I was left on a ventilator and in ICU for 3 days."  . COPD (chronic obstructive pulmonary disease) (HCC)    FVC 72%, FEV1 29%, FEV1 ratio 32% (very severe (COPD)  . COPD (chronic obstructive pulmonary disease) ([place])   . Degenerative disc disease   . Depression with anxiety   . Diverticulosis   . DVT (deep venous thrombosis) ([place])    "LLE; years ago after a surgery"  . Essential hypertension   . Fluttering sensation of heart    pt put on metoprolol as a result  . GERD (gastroesophageal reflux disease)    PMH  . H/O hiatal hernia   . Headache    "maybe weekly" (12/24/2014)  . Hyperlipidemia   . Hypertension   . Kidney stone   . Liver lesion   . On home oxygen therapy    "3L; sleep w/it; use it when I rest" (12/24/2014)  . Peptic ulcer   . Pinched nerve    in back  . Pneumonia   . PONV (postoperative nausea and vomiting)   . Shortness of breath dyspnea    with  exertion  . Skipped heart beats   . Small bowel obstruction ([place])    "several times; OR twice" (12/24/2014)  . Tubular adenoma of colon 09/2011  . Wears glasses     Patient Active Problem List   Diagnosis Date Noted  . Barrett's esophagus without dysplasia 12/15/2015  . Ileus ([place]) 12/02/2015  . Hypokalemia 12/02/2015  . Essential hypertension   . GERD (gastroesophageal reflux disease)   . Depression with anxiety   . Generalized abdominal pain   . Leukocytosis 01/12/2015  . HCAP (healthcare-associated pneumonia) 01/11/2015  . COPD exacerbation ([place]) 01/11/2015  . Aortic atherosclerosis ([place]) 10/05/2014  . Abnormal CT of liver 02/05/2014  . Protein-calorie malnutrition, severe ([place]) 01/29/2014  . Small bowel obstruction ([place]) 01/28/2014  . Chronic respiratory failure ([place]) 12/07/2013  . SBO (small bowel obstruction) ([place]) 08/15/2013  . Lumbar degenerative disc disease 05/28/2013  . Gastric ulcer 04/22/2013  . Other malaise and fatigue 10/09/2012  . CHEST PAIN, PRECORDIAL 10/04/2010  . HLD (hyperlipidemia) 09/14/2010  . TOBACCO ABUSE 09/14/2010  . HYPERTENSION, BENIGN 09/14/2010  . COPD (chronic obstructive pulmonary disease) ([place]) 09/14/2010  . Insomnia 09/14/2010  . PALPITATIONS 09/14/2010  . CAROTID BRUIT 09/14/2010    Past Surgical History:  Procedure Laterality Date  .  ABDOMINAL ADHESION SURGERY  12/24/2014  . ABDOMINAL HYSTERECTOMY  1987   and one ovary  . CATARACT EXTRACTION W/ INTRAOCULAR LENS  IMPLANT, BILATERAL Right 06/22/2012   Dr. Lester Kinsman.   Marland Kitchen CATARACT EXTRACTION W/PHACO Left 06/12/2012   Dr. Boykin Reaper  . CHOLECYSTECTOMY N/A 08/18/2013   Procedure: LAPAROSCOPIC CHOLECYSTECTOMY;  Surgeon: Harl Bowie, MD;  Location: [facility];  Service: General;  Laterality: N/A;  . DILATION AND CURETTAGE OF UTERUS    . EXPLORATORY LAPAROTOMY  12/24/2014  . HERNIA REPAIR    . INCISIONAL HERNIA REPAIR  12/24/2014  . INCISIONAL HERNIA REPAIR N/A 12/24/2014   Procedure:  HERNIA REPAIR INCISIONAL;  Surgeon: Coralie Keens, MD;  Location: [facility];  Service: General;  Laterality: N/A;  . LAPAROTOMY N/A 12/24/2014   Procedure: EXPLORATORY LAPAROTOMY;  Surgeon: Coralie Keens, MD;  Location: [facility];  Service: General;  Laterality: N/A;  . LYSIS OF ADHESION N/A 12/24/2014   Procedure: LYSIS OF ADHESION;  Surgeon: Coralie Keens, MD;  Location: [facility];  Service: General;  Laterality: N/A;  . MULTIPLE TOOTH EXTRACTIONS    . OOPHORECTOMY  1992  . SMALL INTESTINE SURGERY     for a blockage, no bowel was removed, just kinked from scar tissue  . TUBAL LIGATION    . URETHRAL DILATION      OB History    No data available     Home Medications    Prior to Admission medications   Medication Sig Start Date End Date Taking? Authorizing Provider  acetaminophen (TYLENOL) 500 MG tablet Take 500 mg by mouth every 6 (six) hours as needed for mild pain.    Historical Provider, MD  albuterol (PROVENTIL) (2.5 MG/3ML) 0.083% nebulizer solution USE 1 VIAL (3 ML) VIA NEBULIZER EVERY 6 HOURS AS NEEDED FOR WHEEZING OR SHORTNESS OF BREATH Patient taking differently: USE 1 VIAL (3 ML) VIA NEBULIZER THREE TIMES DAILY 10/31/15   [name], MD  amLODipine (NORVASC) 5 MG tablet Take 1 tablet (5 mg total) by mouth daily. 02/21/16   [name], MD  dicyclomine (BENTYL) 20 MG tablet Take 1 tablet (20 mg total) by mouth 4 (four) times daily -  before meals and at bedtime. 02/22/16   [name], MD  ENSURE PLUS (ENSURE PLUS) LIQD Take 237 mLs by mouth 2 (two) times daily between meals. For weight gain    Historical Provider, MD  fluticasone furoate-vilanterol (BREO ELLIPTA) 100-25 MCG/INH AEPB USE 1 INHALATION DAILY 01/19/16   [name], MD  HYDROcodone-acetaminophen [facility]) 10-325 MG tablet Take 1 tablet by mouth every 6 (six) hours as needed. 02/02/16   [name], MD  KLOR-CON M10 10 MEQ tablet Take 1 tablet (10 mEq total) by mouth daily. 02/01/16    [name], MD  Linaclotide Rolan Lipa) 290 MCG CAPS capsule Take 1 capsule (290 mcg total) by mouth daily. 03/08/15   [name], MD  LORazepam (ATIVAN) 1 MG tablet Take 1 tablet (1 mg total) by mouth daily as needed for anxiety or sleep. 02/22/16 10/04/16  Jade L Breeback, PA-C  losartan (COZAAR) 100 MG tablet Take 1 tablet (100 mg total) by mouth daily. 01/30/16   [name], MD  magnesium oxide (MAG-OX) 400 (241.3 Mg) MG tablet Take 1 tablet (400 mg total) by mouth daily. 12/06/15   Theodis Blaze, MD  metoprolol succinate (TOPROL-XL) 50 MG 24 hr tablet Take 1 tablet (50 mg total) by mouth daily. 01/30/16   Rene Kocher  [place], MD  Multiple Vitamin (MULTIVITAMIN WITH MINERALS) TABS tablet Take 1 tablet by mouth daily. Pt uses One-A-Day brand    Historical Provider, MD  omeprazole (PRILOSEC) 40 MG capsule TAKE 1 CAPSULE DAILY FOR GASTROINTESTINAL ULCER 02/24/15   [name], MD  ondansetron (ZOFRAN-ODT) 8 MG disintegrating tablet TAKE 1 TABLET EVERY 8 HOURS AS NEEDED FOR NAUSEA 02/22/16   [name], MD  OXYGEN Inhale 4 L into the lungs continuous.    Historical Provider, MD  PARoxetine (PAXIL) 20 MG tablet Take 1 tablet (20 mg total) by mouth daily. 02/21/16   Sean Hommel, DO  PROAIR HFA 108 (90 Base) MCG/ACT inhaler USE 2 TO 4 INHALATIONS EVERY 4 TO 6 HOURS AS NEEDED Patient taking differently: USE 2 TO 4 INHALATIONS EVERY 4 TO 6 HOURS AS NEEDED FOR SHORTNESS OF BREATH/WHEEZING 10/31/15   [name], MD  traMADol (ULTRAM) 50 MG tablet Take 1 tablet (50 mg total) by mouth every 6 (six) hours as needed (pain). 02/22/16   Jade L Breeback, PA-C  Umeclidinium Bromide (INCRUSE ELLIPTA) 62.5 MCG/INH AEPB Inhale 1 Inhaler into the lungs daily. Patient taking differently: Inhale 1 puff into the lungs daily.  04/25/15   [name], MD   Family History Family History  Problem Relation Age of Onset  . Hypertension Mother   . Ovarian cancer Mother   .  Glaucoma Mother   . Glaucoma Sister   . Colon cancer Neg Hx   . Colon polyps Neg Hx   . Diabetes Neg Hx   . Kidney disease Neg Hx   . Gallbladder disease Neg Hx   . Esophageal cancer Neg Hx     Social History Social History  Substance Use Topics  . Smoking status: Former Smoker    Packs/day: 0.25    Years: 57.00    Types: Cigarettes    Quit date: 07/25/2015  . Smokeless tobacco: Never Used     Comment: 5 cigarettes a day  . Alcohol use No   Allergies   Maxidex [dexamethasone]; Advair diskus [fluticasone-salmeterol]; Trazodone and nefazodone; and Tylox [oxycodone-acetaminophen]   Review of Systems Review of Systems  Constitutional: Positive for fatigue and fever.  Respiratory: Positive for cough and shortness of breath.   Gastrointestinal: Negative for vomiting.  All other systems reviewed and are negative.  Physical Exam Updated Vital Signs BP 136/65   Pulse 102   Temp 97.9 F (36.6 C) (Oral)   Resp 17   Ht '5\' 3"'$  (1.6 m)   Wt 33.6 kg   SpO2 97%   BMI 13.11 kg/m   Physical Exam  Constitutional: No distress.  Elderly, fraile  HENT:  Head: Normocephalic and atraumatic.  Right Ear: External ear normal.  Left Ear: External ear normal.  Eyes: Conjunctivae are normal. Right eye exhibits no discharge. Left eye exhibits no discharge. No scleral icterus.  Neck: Neck supple. No tracheal deviation present.  Cardiovascular: Regular rhythm and intact distal pulses.  Tachycardia present.   Pulmonary/Chest: Effort normal. No stridor. No respiratory distress. She has decreased breath sounds. She has wheezes. She has no rales.  Barrel chest   Abdominal: Soft. Bowel sounds are normal. She exhibits no distension. There is no tenderness. There is no rebound and no guarding.  Musculoskeletal: She exhibits no edema or tenderness.  Neurological: She is alert. She has normal strength. No cranial nerve deficit (no facial droop, extraocular movements intact, no slurred speech) or  sensory deficit. She exhibits normal muscle tone. She  displays no seizure activity. Coordination normal.  Skin: Skin is warm and dry. No rash noted.  Psychiatric: She has a normal mood and affect.  Nursing note and vitals reviewed.    ED Treatments / Results  DIAGNOSTIC STUDIES:  Oxygen Saturation is 92% on RA, low by my interpretation.    COORDINATION OF CARE:  10:06 PM Will order Troponin 1, BMP, brain natriuretic peptide, and CBC. Discussed treatment plan with pt at bedside and pt agreed to plan.   Labs (all labs ordered are listed, but only abnormal results are displayed) Labs Reviewed  BASIC METABOLIC PANEL - Abnormal; Notable for the following:       Result Value   Potassium 3.1 (*)    Chloride 90 (*)    CO2 35 (*)    Glucose, Bld 145 (*)    Calcium 8.6 (*)    All other components within normal limits  CBC - Abnormal; Notable for the following:    WBC 12.0 (*)    RBC 3.60 (*)    Hemoglobin 10.9 (*)    HCT 33.0 (*)    All other components within normal limits  BRAIN NATRIURETIC PEPTIDE - Abnormal; Notable for the following:    B Natriuretic Peptide 140.4 (*)    All other components within normal limits  CULTURE, BLOOD (ROUTINE X 2)  CULTURE, BLOOD (ROUTINE X 2)  TROPONIN I    EKG  EKG Interpretation  Date/Time:  Saturday March 24 2016 21:37:24 EDT Ventricular Rate:  115 PR Interval:  118 QRS Duration: 68 QT Interval:  292 QTC Calculation: 403 R Axis:   90 Text Interpretation:  Sinus tachycardia Right atrial enlargement Rightward axis Pulmonary disease pattern Septal infarct , age undetermined T wave abnormality, consider inferior ischemia Abnormal ECG Since last tracing rate faster t wave changes are new since last tracing Confirmed by Ceferino Lang  MD-J, Corey Laski ([id number]) on 03/24/2016 9:42:14 PM       Radiology Dg Chest 2 View  Result Date: 03/24/2016 CLINICAL DATA:  Acute onset of cough and intermittent fever. Initial encounter. EXAM: CHEST  2 VIEW COMPARISON:   Chest radiograph performed 06/23/2015 FINDINGS: The lungs are hyperexpanded. Peribronchial thickening is noted, with increased interstitial markings, concerning for acute atypical infection superimposed on the patient's COPD. No definite pleural effusion or pneumothorax is seen. The heart is normal in size; the mediastinal contour is within normal limits. No acute osseous abnormalities are seen. IMPRESSION: Peribronchial thickening, with increased interstitial markings, concerning for acute atypical infection superimposed on the patient's COPD. Electronically Signed   By: Garald Balding M.D.   On: 03/24/2016 21:49   Procedures Procedures (including critical care time)  Medications Ordered in ED Medications  cefTRIAXone (ROCEPHIN) 1 g in dextrose 5 % 50 mL IVPB (not administered)  azithromycin (ZITHROMAX) tablet 500 mg (not administered)  methylPREDNISolone sodium succinate (SOLU-MEDROL) 125 mg/2 mL injection 125 mg (125 mg Intravenous Given 03/24/16 2230)  ipratropium-albuterol (DUONEB) 0.5-2.5 (3) MG/3ML nebulizer solution 3 mL (3 mLs Nebulization Given 03/24/16 2221)  albuterol (PROVENTIL) (2.5 MG/3ML) 0.083% nebulizer solution 2.5 mg (2.5 mg Nebulization Given 03/24/16 2221)     Initial Impression / Assessment and Plan / ED Course  I have reviewed the triage vital signs and the nursing notes.  Pertinent labs & imaging results that were available during my care of the patient were reviewed by me and considered in my medical decision making (see chart for details).  Clinical Course   Exam is consistent with a copd exacerbationLabs  are stable.  Potassium supplement ordered. .  CXR shows possible component of pna.  With her age and comorbidities will consult with medical service for transfer and admission.  Final Clinical Impressions(s) / ED Diagnoses   Final diagnoses:  COPD with acute exacerbation ([place])   I personally performed the services described in this documentation, which was scribed  in my presence.  The recorded information has been reviewed and is accurate.    Dorie Rank, MD 03/24/16 [phone]

## 2016-03-24 NOTE — ED Notes (Addendum)
H/o COPD, (denies DM), here for increased sob, needed to increase home O2 from 4.5LPM to 6LPM, also reports fever. Last exacerbation 4 months ago with hospitalization. Lives with family at home. Denies pain. Pt alert, NAD calm, interactive, resps e/u, increased wob noted, speaking ini shortened sentences/ phrases. Skin W&D.

## 2016-03-24 NOTE — ED Notes (Signed)
Neb in progress, NAD, calm, family at [facility], RT at [facility].

## 2016-03-25 ENCOUNTER — Observation Stay (HOSPITAL_COMMUNITY): Payer: Medicare Other

## 2016-03-25 ENCOUNTER — Encounter (HOSPITAL_COMMUNITY): Payer: Self-pay | Admitting: Radiology

## 2016-03-25 DIAGNOSIS — E876 Hypokalemia: Secondary | ICD-10-CM | POA: Diagnosis present

## 2016-03-25 DIAGNOSIS — G8929 Other chronic pain: Secondary | ICD-10-CM | POA: Diagnosis present

## 2016-03-25 DIAGNOSIS — I371 Nonrheumatic pulmonary valve insufficiency: Secondary | ICD-10-CM | POA: Diagnosis not present

## 2016-03-25 DIAGNOSIS — I10 Essential (primary) hypertension: Secondary | ICD-10-CM | POA: Diagnosis not present

## 2016-03-25 DIAGNOSIS — E222 Syndrome of inappropriate secretion of antidiuretic hormone: Secondary | ICD-10-CM | POA: Diagnosis present

## 2016-03-25 DIAGNOSIS — D649 Anemia, unspecified: Secondary | ICD-10-CM | POA: Diagnosis present

## 2016-03-25 DIAGNOSIS — Z681 Body mass index (BMI) 19 or less, adult: Secondary | ICD-10-CM | POA: Diagnosis not present

## 2016-03-25 DIAGNOSIS — J441 Chronic obstructive pulmonary disease with (acute) exacerbation: Secondary | ICD-10-CM

## 2016-03-25 DIAGNOSIS — A419 Sepsis, unspecified organism: Secondary | ICD-10-CM | POA: Diagnosis not present

## 2016-03-25 DIAGNOSIS — Z9981 Dependence on supplemental oxygen: Secondary | ICD-10-CM | POA: Diagnosis not present

## 2016-03-25 DIAGNOSIS — J440 Chronic obstructive pulmonary disease with acute lower respiratory infection: Secondary | ICD-10-CM | POA: Diagnosis present

## 2016-03-25 DIAGNOSIS — K219 Gastro-esophageal reflux disease without esophagitis: Secondary | ICD-10-CM | POA: Diagnosis present

## 2016-03-25 DIAGNOSIS — R05 Cough: Secondary | ICD-10-CM | POA: Diagnosis not present

## 2016-03-25 DIAGNOSIS — J189 Pneumonia, unspecified organism: Secondary | ICD-10-CM | POA: Diagnosis present

## 2016-03-25 DIAGNOSIS — I248 Other forms of acute ischemic heart disease: Secondary | ICD-10-CM | POA: Diagnosis present

## 2016-03-25 DIAGNOSIS — J9621 Acute and chronic respiratory failure with hypoxia: Secondary | ICD-10-CM | POA: Diagnosis present

## 2016-03-25 DIAGNOSIS — T380X5A Adverse effect of glucocorticoids and synthetic analogues, initial encounter: Secondary | ICD-10-CM | POA: Diagnosis present

## 2016-03-25 DIAGNOSIS — Z86718 Personal history of other venous thrombosis and embolism: Secondary | ICD-10-CM | POA: Diagnosis not present

## 2016-03-25 DIAGNOSIS — K5909 Other constipation: Secondary | ICD-10-CM | POA: Diagnosis not present

## 2016-03-25 DIAGNOSIS — R0602 Shortness of breath: Secondary | ICD-10-CM | POA: Diagnosis not present

## 2016-03-25 DIAGNOSIS — R739 Hyperglycemia, unspecified: Secondary | ICD-10-CM | POA: Diagnosis not present

## 2016-03-25 DIAGNOSIS — E162 Hypoglycemia, unspecified: Secondary | ICD-10-CM | POA: Diagnosis not present

## 2016-03-25 DIAGNOSIS — A408 Other streptococcal sepsis: Secondary | ICD-10-CM | POA: Diagnosis not present

## 2016-03-25 DIAGNOSIS — R109 Unspecified abdominal pain: Secondary | ICD-10-CM | POA: Diagnosis present

## 2016-03-25 DIAGNOSIS — Z66 Do not resuscitate: Secondary | ICD-10-CM | POA: Diagnosis present

## 2016-03-25 DIAGNOSIS — Z87891 Personal history of nicotine dependence: Secondary | ICD-10-CM | POA: Diagnosis not present

## 2016-03-25 DIAGNOSIS — Z9842 Cataract extraction status, left eye: Secondary | ICD-10-CM | POA: Diagnosis not present

## 2016-03-25 DIAGNOSIS — K5900 Constipation, unspecified: Secondary | ICD-10-CM

## 2016-03-25 DIAGNOSIS — E785 Hyperlipidemia, unspecified: Secondary | ICD-10-CM | POA: Diagnosis present

## 2016-03-25 LAB — BASIC METABOLIC PANEL
Anion gap: 9 (ref 5–15)
BUN: 9 mg/dL (ref 6–20)
CALCIUM: 8.6 mg/dL — AB (ref 8.9–10.3)
CHLORIDE: 92 mmol/L — AB (ref 101–111)
CO2: 34 mmol/L — AB (ref 22–32)
CREATININE: 0.57 mg/dL (ref 0.44–1.00)
GFR calc non Af Amer: >60 mL/min (ref >60)
Glucose, Bld: 237 mg/dL — ABNORMAL HIGH (ref 65–99)
Potassium: 3.5 mmol/L (ref 3.5–5.1)
SODIUM: 135 mmol/L (ref 135–145)

## 2016-03-25 LAB — CBC
HCT: 31.1 % — ABNORMAL LOW (ref 36.0–46.0)
Hemoglobin: 10.1 g/dL — ABNORMAL LOW (ref 12.0–15.0)
MCH: 29.4 pg (ref 26.0–34.0)
MCHC: 32.5 g/dL (ref 30.0–36.0)
MCV: 90.7 fL (ref 78.0–100.0)
PLATELETS: 300 10*3/uL (ref 150–400)
RBC: 3.43 MIL/uL — AB (ref 3.87–5.11)
RDW: 12.9 % (ref 11.5–15.5)
WBC: 9.3 10*3/uL (ref 4.0–10.5)

## 2016-03-25 LAB — TROPONIN I: Troponin I: 0.35 ng/mL (ref <0.03)

## 2016-03-25 LAB — GLUCOSE, CAPILLARY
GLUCOSE-CAPILLARY: 142 mg/dL — AB (ref 65–99)
GLUCOSE-CAPILLARY: 267 mg/dL — AB (ref 65–99)
Glucose-Capillary: 139 mg/dL — ABNORMAL HIGH (ref 65–99)

## 2016-03-25 LAB — STREP PNEUMONIAE URINARY ANTIGEN: STREP PNEUMO URINARY ANTIGEN: NEGATIVE

## 2016-03-25 MED ORDER — DICYCLOMINE HCL 20 MG PO TABS
20.0000 mg | ORAL_TABLET | Freq: Three times a day (TID) | ORAL | Status: DC
  Administered 2016-03-25 – 2016-03-28 (×15): 20 mg via ORAL
  Filled 2016-03-25 (×15): qty 1

## 2016-03-25 MED ORDER — INSULIN DETEMIR 100 UNIT/ML ~~LOC~~ SOLN
15.0000 [IU] | Freq: Every day | SUBCUTANEOUS | Status: DC
  Filled 2016-03-25: qty 0.15

## 2016-03-25 MED ORDER — AZITHROMYCIN 500 MG PO TABS
250.0000 mg | ORAL_TABLET | ORAL | Status: DC
  Administered 2016-03-25 – 2016-03-27 (×3): 250 mg via ORAL
  Filled 2016-03-25 (×3): qty 1

## 2016-03-25 MED ORDER — INSULIN DETEMIR 100 UNIT/ML ~~LOC~~ SOLN
10.0000 [IU] | Freq: Every day | SUBCUTANEOUS | Status: DC
Start: 2016-03-25 — End: 2016-03-27
  Administered 2016-03-25 – 2016-03-26 (×2): 10 [IU] via SUBCUTANEOUS
  Filled 2016-03-25 (×3): qty 0.1

## 2016-03-25 MED ORDER — IPRATROPIUM-ALBUTEROL 0.5-2.5 (3) MG/3ML IN SOLN
3.0000 mL | Freq: Four times a day (QID) | RESPIRATORY_TRACT | Status: DC
  Administered 2016-03-25 – 2016-03-28 (×14): 3 mL via RESPIRATORY_TRACT
  Filled 2016-03-25 (×15): qty 3

## 2016-03-25 MED ORDER — LOSARTAN POTASSIUM 50 MG PO TABS
100.0000 mg | ORAL_TABLET | Freq: Every day | ORAL | Status: DC
  Administered 2016-03-25 – 2016-03-28 (×4): 100 mg via ORAL
  Filled 2016-03-25 (×4): qty 2

## 2016-03-25 MED ORDER — AMLODIPINE BESYLATE 5 MG PO TABS
5.0000 mg | ORAL_TABLET | Freq: Every day | ORAL | Status: DC
  Administered 2016-03-25 – 2016-03-28 (×4): 5 mg via ORAL
  Filled 2016-03-25 (×4): qty 1

## 2016-03-25 MED ORDER — ONDANSETRON HCL 4 MG PO TABS: 4.0000 mg | ORAL_TABLET | Freq: Four times a day (QID) | ORAL | Status: DC | PRN

## 2016-03-25 MED ORDER — LINACLOTIDE 145 MCG PO CAPS
290.0000 ug | ORAL_CAPSULE | Freq: Every day | ORAL | Status: DC
  Administered 2016-03-25 – 2016-03-28 (×4): 290 ug via ORAL
  Filled 2016-03-25 (×6): qty 2

## 2016-03-25 MED ORDER — ADULT MULTIVITAMIN W/MINERALS CH
1.0000 | ORAL_TABLET | Freq: Every day | ORAL | Status: DC
  Administered 2016-03-25 – 2016-03-28 (×4): 1 via ORAL
  Filled 2016-03-25 (×4): qty 1

## 2016-03-25 MED ORDER — ARFORMOTEROL TARTRATE 15 MCG/2ML IN NEBU
15.0000 ug | INHALATION_SOLUTION | Freq: Two times a day (BID) | RESPIRATORY_TRACT | Status: DC
  Administered 2016-03-25 – 2016-03-28 (×7): 15 ug via RESPIRATORY_TRACT
  Filled 2016-03-25 (×7): qty 2

## 2016-03-25 MED ORDER — ENSURE ENLIVE PO LIQD
237.0000 mL | Freq: Two times a day (BID) | ORAL | Status: DC
  Administered 2016-03-25 – 2016-03-28 (×5): 237 mL via ORAL

## 2016-03-25 MED ORDER — ACETAMINOPHEN 325 MG PO TABS: 650.0000 mg | ORAL_TABLET | Freq: Four times a day (QID) | ORAL | Status: DC | PRN

## 2016-03-25 MED ORDER — INSULIN ASPART 100 UNIT/ML ~~LOC~~ SOLN
0.0000 [IU] | Freq: Three times a day (TID) | SUBCUTANEOUS | Status: DC
  Administered 2016-03-25: 5 [IU] via SUBCUTANEOUS
  Administered 2016-03-25: 1 [IU] via SUBCUTANEOUS
  Administered 2016-03-27: 2 [IU] via SUBCUTANEOUS
  Administered 2016-03-28: 1 [IU] via SUBCUTANEOUS

## 2016-03-25 MED ORDER — ENOXAPARIN SODIUM 30 MG/0.3ML ~~LOC~~ SOLN
20.0000 mg | SUBCUTANEOUS | Status: DC
  Administered 2016-03-25 – 2016-03-28 (×4): 20 mg via SUBCUTANEOUS
  Filled 2016-03-25 (×2): qty 0.3
  Filled 2016-03-25: qty 0.2
  Filled 2016-03-25 (×2): qty 0.3
  Filled 2016-03-25: qty 0.2

## 2016-03-25 MED ORDER — METOPROLOL SUCCINATE ER 50 MG PO TB24
50.0000 mg | ORAL_TABLET | Freq: Every day | ORAL | Status: DC
  Administered 2016-03-25 – 2016-03-28 (×4): 50 mg via ORAL
  Filled 2016-03-25 (×4): qty 1

## 2016-03-25 MED ORDER — UMECLIDINIUM BROMIDE 62.5 MCG/INH IN AEPB: 1.0000 | INHALATION_SPRAY | Freq: Every day | RESPIRATORY_TRACT | Status: DC

## 2016-03-25 MED ORDER — INSULIN DETEMIR 100 UNIT/ML ~~LOC~~ SOLN
10.0000 [IU] | Freq: Every day | SUBCUTANEOUS | Status: DC
  Filled 2016-03-25: qty 0.1

## 2016-03-25 MED ORDER — PANTOPRAZOLE SODIUM 40 MG PO TBEC
40.0000 mg | DELAYED_RELEASE_TABLET | Freq: Two times a day (BID) | ORAL | Status: DC
Start: 2016-03-25 — End: 2016-03-28
  Administered 2016-03-25 – 2016-03-28 (×7): 40 mg via ORAL
  Filled 2016-03-25 (×7): qty 1

## 2016-03-25 MED ORDER — HYDROCODONE-ACETAMINOPHEN 10-325 MG PO TABS
1.0000 | ORAL_TABLET | Freq: Four times a day (QID) | ORAL | Status: DC | PRN
  Administered 2016-03-25 (×2): 1 via ORAL
  Filled 2016-03-25 (×2): qty 1

## 2016-03-25 MED ORDER — IPRATROPIUM-ALBUTEROL 0.5-2.5 (3) MG/3ML IN SOLN: 3.0000 mL | RESPIRATORY_TRACT | Status: DC | PRN

## 2016-03-25 MED ORDER — TRAMADOL HCL 50 MG PO TABS
50.0000 mg | ORAL_TABLET | Freq: Four times a day (QID) | ORAL | Status: DC | PRN
  Administered 2016-03-25 – 2016-03-28 (×3): 50 mg via ORAL
  Filled 2016-03-25 (×3): qty 1

## 2016-03-25 MED ORDER — POTASSIUM CHLORIDE CRYS ER 10 MEQ PO TBCR
10.0000 meq | EXTENDED_RELEASE_TABLET | Freq: Every day | ORAL | Status: DC
  Administered 2016-03-25: 10 meq via ORAL
  Filled 2016-03-25: qty 1

## 2016-03-25 MED ORDER — FLUTICASONE FUROATE-VILANTEROL 100-25 MCG/INH IN AEPB
1.0000 | INHALATION_SPRAY | Freq: Every day | RESPIRATORY_TRACT | Status: DC
Start: 2016-03-25 — End: 2016-03-25
  Filled 2016-03-25: qty 28

## 2016-03-25 MED ORDER — ACETAMINOPHEN 650 MG RE SUPP: 650.0000 mg | Freq: Four times a day (QID) | RECTAL | Status: DC | PRN

## 2016-03-25 MED ORDER — ONDANSETRON HCL 4 MG/2ML IJ SOLN
4.0000 mg | Freq: Four times a day (QID) | INTRAMUSCULAR | Status: DC | PRN
Start: 2016-03-25 — End: 2016-03-28
  Administered 2016-03-27: 4 mg via INTRAVENOUS

## 2016-03-25 MED ORDER — DEXTROSE 5 % IV SOLN
1.0000 g | INTRAVENOUS | Status: DC
  Administered 2016-03-25 – 2016-03-27 (×3): 1 g via INTRAVENOUS
  Filled 2016-03-25 (×4): qty 10

## 2016-03-25 MED ORDER — PAROXETINE HCL 20 MG PO TABS
20.0000 mg | ORAL_TABLET | Freq: Every day | ORAL | Status: DC
  Administered 2016-03-25 – 2016-03-28 (×4): 20 mg via ORAL
  Filled 2016-03-25 (×4): qty 1

## 2016-03-25 MED ORDER — METHYLPREDNISOLONE SODIUM SUCC 125 MG IJ SOLR
60.0000 mg | Freq: Two times a day (BID) | INTRAMUSCULAR | Status: DC
  Administered 2016-03-25 (×2): 60 mg via INTRAVENOUS
  Filled 2016-03-25 (×2): qty 2

## 2016-03-25 MED ORDER — IOPAMIDOL (ISOVUE-370) INJECTION 76%
INTRAVENOUS | Status: AC
  Administered 2016-03-25: 100 mL
  Filled 2016-03-25: qty 100

## 2016-03-25 MED ORDER — MAGNESIUM OXIDE 400 (241.3 MG) MG PO TABS
400.0000 mg | ORAL_TABLET | Freq: Every day | ORAL | Status: DC
  Administered 2016-03-25 – 2016-03-28 (×4): 400 mg via ORAL
  Filled 2016-03-25 (×4): qty 1

## 2016-03-25 MED ORDER — LORAZEPAM 1 MG PO TABS
1.0000 mg | ORAL_TABLET | Freq: Every day | ORAL | Status: DC | PRN
  Administered 2016-03-25 – 2016-03-26 (×2): 1 mg via ORAL
  Filled 2016-03-25 (×2): qty 1

## 2016-03-25 MED ORDER — BUDESONIDE 0.5 MG/2ML IN SUSP
0.5000 mg | Freq: Two times a day (BID) | RESPIRATORY_TRACT | Status: DC
  Administered 2016-03-25 – 2016-03-28 (×7): 0.5 mg via RESPIRATORY_TRACT
  Filled 2016-03-25 (×7): qty 2

## 2016-03-25 NOTE — Evaluation (Signed)
Physical Therapy Evaluation Patient Details Name: Tamara Adams MRN: [id number] DOB: May 24, 1941 Today's Date: 03/25/2016   History of Present Illness  Patient is a 75 yo female admitted 03/24/16 with cough and SOB.  Patient with COPD exacerbation and pna.    PMH:  COPD with home O2 at 4 L/min, DM, HTN, HLD, chronic abdominal pain, nausea, depression, anxiety    Clinical Impression  Patient presents with problems listed below.  Will benefit from acute PT to maximize functional mobility prior to discharge home with family.  Patient fatigued quickly.  Mobility should improve as medical status improves.  Do not anticipate any f/u PT needs at d/c.    Follow Up Recommendations No PT follow up;Supervision - Intermittent    Equipment Recommendations  None recommended by PT    Recommendations for Other Services       Precautions / Restrictions Precautions Precautions: Fall (Mod risk) Precaution Comments: On O2 at 4 L/min Restrictions Weight Bearing Restrictions: No      Mobility  Bed Mobility Overal bed mobility: Modified Independent             General bed mobility comments: Increased time  Transfers Overall transfer level: Needs assistance Equipment used: None Transfers: Sit to/from Stand Sit to Stand: Supervision         General transfer comment: Good balance in static stance.  Supervision for safety only.  Ambulation/Gait Ambulation/Gait assistance: Supervision Ambulation Distance (Feet): 5 Feet Assistive device: None Gait Pattern/deviations: Step-through pattern     General Gait Details: Patient able to step in place, 10 steps each foot.  Fatigued quickly.  Patient ambulated 5' forward and 5' backward. No loss of balance.    Stairs            Wheelchair Mobility    Modified Rankin (Stroke Patients Only)       Balance Overall balance assessment: Needs assistance         Standing balance support: No upper extremity supported Standing balance-Leahy  Scale: Good                               Pertinent Vitals/Pain Pain Assessment: Faces Faces Pain Scale: Hurts a little bit Pain Location: abdomen Pain Descriptors / Indicators: Aching Pain Intervention(s): Monitored during session    Home Living Family/patient expects to be discharged to:: Private residence Living Arrangements: Spouse/significant other;Children;Other relatives (Husband, son, granddaughter (71), great-granddtr (3)) Available Help at Discharge: Family;Available 24 hours/day Type of Home: House Home Access: Level entry     Home Layout: One level Home Equipment: Walker - 2 wheels;Shower seat;Grab bars - tub/shower;Hand held shower head Additional Comments: Husband has decreased cognition from CVA per patient    Prior Function Level of Independence: Independent;Needs assistance   Gait / Transfers Assistance Needed: Independent  ADL's / Homemaking Assistance Needed: Family assists with meal prep and housekeeping        Hand Dominance   Dominant Hand: Right    Extremity/Trunk Assessment   Upper Extremity Assessment: Generalized weakness           Lower Extremity Assessment: Generalized weakness      Cervical / Trunk Assessment: Kyphotic  Communication   Communication: No difficulties  Cognition Arousal/Alertness: Awake/alert Behavior During Therapy: WFL for tasks assessed/performed Overall Cognitive Status: Within Functional Limits for tasks assessed                      General  Comments      Exercises        Assessment/Plan    PT Assessment Patient needs continued PT services  PT Diagnosis Difficulty walking;Generalized weakness;Acute pain   PT Problem List Decreased strength;Decreased activity tolerance;Decreased balance;Decreased mobility;Decreased knowledge of use of DME;Cardiopulmonary status limiting activity;Pain  PT Treatment Interventions DME instruction;Gait training;Functional mobility  training;Therapeutic activities;Therapeutic exercise;Patient/family education   PT Goals (Current goals can be found in the Care Plan section) Acute Rehab PT Goals Patient Stated Goal: To go home PT Goal Formulation: With patient Time For Goal Achievement: 04/01/16 Potential to Achieve Goals: Good    Frequency Min 3X/week   Barriers to discharge        Co-evaluation               End of Session Equipment Utilized During Treatment: Gait belt;Oxygen Activity Tolerance: Patient limited by fatigue Patient left: in bed;with call bell/phone within reach;with family/visitor present (sitting EOB for dinner) Nurse Communication: Mobility status    Functional Assessment Tool Used: Clinical judgement Functional Limitation: Mobility: Walking and moving around Mobility: Walking and Moving Around Current Status (B0175): At least 1 percent but less than 20 percent impaired, limited or restricted Mobility: Walking and Moving Around Goal Status [phone]): 0 percent impaired, limited or restricted    Time: 1658-1716 PT Time Calculation (min) (ACUTE ONLY): 18 min   Charges:   PT Evaluation $PT Eval Moderate Complexity: 1 Procedure     PT G Codes:   PT G-Codes **NOT FOR INPATIENT CLASS** Functional Assessment Tool Used: Clinical judgement Functional Limitation: Mobility: Walking and moving around Mobility: Walking and Moving Around Current Status (N2778): At least 1 percent but less than 20 percent impaired, limited or restricted Mobility: Walking and Moving Around Goal Status [phone]): 0 percent impaired, limited or restricted    Despina Pole 03/25/2016, 5:34 PM Carita Pian. Sanjuana Kava, [place] Pager [phone]

## 2016-03-25 NOTE — Progress Notes (Signed)
Accepted to [facility] med-surg bed under OBS status from [facility] (Dr. Hillard Danker) for treatment of CAP with acute exacerbation in COPD. She requires at least 4 Lpm at home, and had been increasingly dyspneic with cough and congestion. CXR possible PNA. Vitals stable after some initial tachycardia and tachypnea. Saturating well on 6 Lpm in ED. Afebrile. Labs with mild leukocytosis, mild BNP elevation, stable anemia, hypokalemia. Treated with Rocephin, azithro, DuoNeb, 125 mg Solu-Medrol, and 40 mEq oral K+.

## 2016-03-25 NOTE — Progress Notes (Signed)
Patient refused Ensure. Drinking milkshake from [employer]. Instructed re: Diabetes,CBG's.

## 2016-03-25 NOTE — Progress Notes (Signed)
New Admission Note: Pt Admitted from [facility] to 6E27  Arrival Method: via carelink Mental Orientation: alert and oriented x 4 Telemetry:  Assessment: Completed Skin: Intact IV: bilateral fa's SL Pain: Denies Tubes: None Safety Measures: Safety Fall Prevention Plan has been discussed  Admission: to be completed [address] [place] Orientation: Patient has been orientated to the room, unit and staff.  Family: None at bedside  Orders to be reviewed and implemented. Will continue to monitor the patient. Call light has been placed within reach and bed alarm has been activated.  Dorthey Sawyer, RN

## 2016-03-25 NOTE — Progress Notes (Signed)
Positive troponin 0.35. MD notified. Will continue to monitor.  Kaiulani Sitton, Mervin Kung RN

## 2016-03-25 NOTE — H&P (Signed)
History and Physical    Tamara Adams VPX:[id number] DOB: [birth year] DOA: 03/24/2016  Referring MD/NP/PA: Dr. Tomi Bamberger PCP: Beatrice Lecher, MD  Patient coming from: Transfer from [facility]  Chief Complaint: Cough and Shortness of breath  HPI: Tamara Adams is a 75 y.o. female with medical history significant of COPD on 4 L of oxygen, HTN, HLD, GERD, chronic abdominal pain, nausea, and depression; who presents with complaints of progressively worsening cough and shortness of breath for the last 1- 1/2 weeks. She reports that the cough is productive of thick whitish sputum. She has had to increase her oxygen requirements from 4 L up to 6. Associated symptoms include wheeze, intermittent low-grade fever, and malaise/fatigue. She tried regular inhalers and breathing treatments without relief of symptoms. She also notes chronic nausea and abdominal pain for which she takes Zofran and tramadol/hydrocodone respectively. Her last bowel movement was approximately 1 week ago.   ED Course: Upon admission to admission department patient was evaluated and seen to be afebrile. Lab work revealed WBC 12, hemoglobin 10.9, potassium 3.1, and all other labs were relatively within normal limits. X-ray imaging showed signs of a possible atypical pneumonia. Patient was given breathing treatments, placed on 6 L nasal cannula oxygen to maintain O2 sats, and given antibiotics of ceftriaxone and azithromycin. TRH called to admit.  Review of Systems: As per HPI otherwise 10 point review of systems negative.   Past Medical History:  Diagnosis Date  . Anemia    as a child  . Arthritis    "hands" (12/24/2014)  . Complication of anesthesia    " My blood gas dropped during surgery so I was left on a ventilator and in ICU for 3 days."  . COPD (chronic obstructive pulmonary disease) (HCC)    FVC 72%, FEV1 29%, FEV1 ratio 32% (very severe (COPD)  . COPD (chronic obstructive pulmonary disease) ([place])   . Degenerative disc disease   .  Depression with anxiety   . Diverticulosis   . DVT (deep venous thrombosis) ([place])    "LLE; years ago after a surgery"  . Essential hypertension   . Fluttering sensation of heart    pt put on metoprolol as a result  . GERD (gastroesophageal reflux disease)    PMH  . H/O hiatal hernia   . Headache    "maybe weekly" (12/24/2014)  . Hyperlipidemia   . Hypertension   . Kidney stone   . Liver lesion   . On home oxygen therapy    "3L; sleep w/it; use it when I rest" (12/24/2014)  . Peptic ulcer   . Pinched nerve    in back  . Pneumonia   . PONV (postoperative nausea and vomiting)   . Shortness of breath dyspnea    with exertion  . Skipped heart beats   . Small bowel obstruction ([place])    "several times; OR twice" (12/24/2014)  . Tubular adenoma of colon 09/2011  . Wears glasses     Past Surgical History:  Procedure Laterality Date  . ABDOMINAL ADHESION SURGERY  12/24/2014  . ABDOMINAL HYSTERECTOMY  1987   and one ovary  . CATARACT EXTRACTION W/ INTRAOCULAR LENS  IMPLANT, BILATERAL Right 06/22/2012   Dr. Lester Kinsman.   Marland Kitchen CATARACT EXTRACTION W/PHACO Left 06/12/2012   Dr. Boykin Reaper  . CHOLECYSTECTOMY N/A 08/18/2013   Procedure: LAPAROSCOPIC CHOLECYSTECTOMY;  Surgeon: Harl Bowie, MD;  Location: [place];  Service: General;  Laterality: N/A;  . DILATION AND CURETTAGE OF UTERUS    . EXPLORATORY  LAPAROTOMY  12/24/2014  . HERNIA REPAIR    . INCISIONAL HERNIA REPAIR  12/24/2014  . INCISIONAL HERNIA REPAIR N/A 12/24/2014   Procedure: HERNIA REPAIR INCISIONAL;  Surgeon: Coralie Keens, MD;  Location: [place];  Service: General;  Laterality: N/A;  . LAPAROTOMY N/A 12/24/2014   Procedure: EXPLORATORY LAPAROTOMY;  Surgeon: Coralie Keens, MD;  Location: [place];  Service: General;  Laterality: N/A;  . LYSIS OF ADHESION N/A 12/24/2014   Procedure: LYSIS OF ADHESION;  Surgeon: Coralie Keens, MD;  Location: [place];  Service: General;  Laterality: N/A;  . MULTIPLE TOOTH EXTRACTIONS    . OOPHORECTOMY   1992  . SMALL INTESTINE SURGERY     for a blockage, no bowel was removed, just kinked from scar tissue  . TUBAL LIGATION    . URETHRAL DILATION       reports that she quit smoking about 8 months ago. Her smoking use included Cigarettes. She has a 14.25 pack-year smoking history. She has never used smokeless tobacco. She reports that she does not drink alcohol or use drugs.  Allergies  Allergen Reactions  . Maxidex [Dexamethasone] Other (See Comments)    Dehydration, heart racing dehydration  . Advair Diskus [Fluticasone-Salmeterol]     No benefit with lungs  . Trazodone And Nefazodone Other (See Comments)    Heart pounding  . Tylox [Oxycodone-Acetaminophen] Itching    Family History  Problem Relation Age of Onset  . Hypertension Mother   . Ovarian cancer Mother   . Glaucoma Mother   . Glaucoma Sister   . Colon cancer Neg Hx   . Colon polyps Neg Hx   . Diabetes Neg Hx   . Kidney disease Neg Hx   . Gallbladder disease Neg Hx   . Esophageal cancer Neg Hx     Prior to Admission medications   Medication Sig Start Date End Date Taking? Authorizing Provider  acetaminophen (TYLENOL) 500 MG tablet Take 500 mg by mouth every 6 (six) hours as needed for mild pain.    Historical Provider, MD  albuterol (PROVENTIL) (2.5 MG/3ML) 0.083% nebulizer solution USE 1 VIAL (3 ML) VIA NEBULIZER EVERY 6 HOURS AS NEEDED FOR WHEEZING OR SHORTNESS OF BREATH Patient taking differently: USE 1 VIAL (3 ML) VIA NEBULIZER THREE TIMES DAILY 10/31/15   [name], MD  amLODipine (NORVASC) 5 MG tablet Take 1 tablet (5 mg total) by mouth daily. 02/21/16   [name], MD  dicyclomine (BENTYL) 20 MG tablet Take 1 tablet (20 mg total) by mouth 4 (four) times daily -  before meals and at bedtime. 02/22/16   [name], MD  ENSURE PLUS (ENSURE PLUS) LIQD Take 237 mLs by mouth 2 (two) times daily between meals. For weight gain    Historical Provider, MD  fluticasone furoate-vilanterol (BREO  ELLIPTA) 100-25 MCG/INH AEPB USE 1 INHALATION DAILY 01/19/16   [name], MD  HYDROcodone-acetaminophen [facility]) 10-325 MG tablet Take 1 tablet by mouth every 6 (six) hours as needed. 02/02/16   [name], MD  KLOR-CON M10 10 MEQ tablet Take 1 tablet (10 mEq total) by mouth daily. 02/01/16   [name], MD  Linaclotide Rolan Lipa) 290 MCG CAPS capsule Take 1 capsule (290 mcg total) by mouth daily. 03/08/15   [name], MD  LORazepam (ATIVAN) 1 MG tablet Take 1 tablet (1 mg total) by mouth daily as needed for anxiety or sleep. 02/22/16 10/04/16  Jade L Breeback, PA-C  losartan (COZAAR) 100 MG  tablet Take 1 tablet (100 mg total) by mouth daily. 01/30/16   [name], MD  magnesium oxide (MAG-OX) 400 (241.3 Mg) MG tablet Take 1 tablet (400 mg total) by mouth daily. 12/06/15   Theodis Blaze, MD  metoprolol succinate (TOPROL-XL) 50 MG 24 hr tablet Take 1 tablet (50 mg total) by mouth daily. 01/30/16   [name], MD  Multiple Vitamin (MULTIVITAMIN WITH MINERALS) TABS tablet Take 1 tablet by mouth daily. Pt uses One-A-Day brand    Historical Provider, MD  omeprazole (PRILOSEC) 40 MG capsule TAKE 1 CAPSULE DAILY FOR GASTROINTESTINAL ULCER 02/24/15   [name], MD  ondansetron (ZOFRAN-ODT) 8 MG disintegrating tablet TAKE 1 TABLET EVERY 8 HOURS AS NEEDED FOR NAUSEA 02/22/16   [name], MD  OXYGEN Inhale 4 L into the lungs continuous.    Historical Provider, MD  PARoxetine (PAXIL) 20 MG tablet Take 1 tablet (20 mg total) by mouth daily. 02/21/16   Sean Hommel, DO  PROAIR HFA 108 (90 Base) MCG/ACT inhaler USE 2 TO 4 INHALATIONS EVERY 4 TO 6 HOURS AS NEEDED Patient taking differently: USE 2 TO 4 INHALATIONS EVERY 4 TO 6 HOURS AS NEEDED FOR SHORTNESS OF BREATH/WHEEZING 10/31/15   [name], MD  traMADol (ULTRAM) 50 MG tablet Take 1 tablet (50 mg total) by mouth every 6 (six) hours as needed (pain). 02/22/16   Jade L Breeback, PA-C    Umeclidinium Bromide (INCRUSE ELLIPTA) 62.5 MCG/INH AEPB Inhale 1 Inhaler into the lungs daily. Patient taking differently: Inhale 1 puff into the lungs daily.  04/25/15   [name], MD    Physical Exam:   Constitutional: Thin frail chronically ill-appearing female on nasal cannula oxygen Vitals:   03/25/16 0000 03/25/16 0030 03/25/16 0100 03/25/16 0215  BP: 147/74 156/82 148/78 (!) 144/65  Pulse: 97 98 97 99  Resp: '17 20 19 19  '$ Temp:    98.2 F (36.8 C)  TempSrc:    Oral  SpO2: 98% 93% 94% 96%  Weight:    35.8 kg (78 lb 14.8 oz)  Height:    '5\' 3"'$  (1.6 m)   Eyes: PERRL, lids and conjunctivae normal ENMT: Mucous membranes are moist. Posterior pharynx clear of any exudate or lesions.Normal dentition.  Neck: normal, supple, no masses, no thyromegaly Respiratory: Bilateral wheezes appreciated throughout both lung fields. No accessory muscle usage. Patient currently back down to 4 L nasal cannula oxygen with O2 saturations 94% Cardiovascular: Regular rate and rhythm, no murmurs / rubs / gallops. No extremity edema. 2+ pedal pulses. No carotid bruits.  Abdomen:  Generalized tenderness, no masses palpated. No hepatosplenomegaly. Bowel sounds decreased.  Musculoskeletal: no clubbing / cyanosis. No joint deformity upper and lower extremities. Good ROM, no contractures. Normal muscle tone.  Skin: no rashes, lesions, ulcers. No induration Neurologic: CN 2-12 grossly intact. Sensation intact, DTR normal. Strength 4+/5 in all 4.  Psychiatric: Normal judgment and insight. Alert and oriented x 3. Normal mood.     Labs on Admission: I have personally reviewed following labs and imaging studies  CBC:  Recent Labs Lab 03/24/16 2215  WBC 12.0*  HGB 10.9*  HCT 33.0*  MCV 91.7  PLT 812   Basic Metabolic Panel:  Recent Labs Lab 03/24/16 2215  NA 135  K 3.1*  CL 90*  CO2 35*  GLUCOSE 145*  BUN 14  CREATININE 0.70  CALCIUM 8.6*   GFR: Estimated Creatinine Clearance:  34.9 mL/min (by C-G formula based on  SCr of 0.8 mg/dL). Liver Function Tests: No results for input(s): AST, ALT, ALKPHOS, BILITOT, PROT, ALBUMIN in the last 168 hours. No results for input(s): LIPASE, AMYLASE in the last 168 hours. No results for input(s): AMMONIA in the last 168 hours. Coagulation Profile: No results for input(s): INR, PROTIME in the last 168 hours. Cardiac Enzymes:  Recent Labs Lab 03/24/16 2215  TROPONINI <0.03   BNP (last 3 results) No results for input(s): PROBNP in the last 8760 hours. HbA1C: No results for input(s): HGBA1C in the last 72 hours. CBG: No results for input(s): GLUCAP in the last 168 hours. Lipid Profile: No results for input(s): CHOL, HDL, LDLCALC, TRIG, CHOLHDL, LDLDIRECT in the last 72 hours. Thyroid Function Tests: No results for input(s): TSH, T4TOTAL, FREET4, T3FREE, THYROIDAB in the last 72 hours. Anemia Panel: No results for input(s): VITAMINB12, FOLATE, FERRITIN, TIBC, IRON, RETICCTPCT in the last 72 hours. Urine analysis:    Component Value Date/Time   COLORURINE YELLOW 12/02/2015 [place] 12/02/2015 1328   LABSPEC 1.012 12/02/2015 1328   PHURINE 7.0 12/02/2015 1328   GLUCOSEU NEGATIVE 12/02/2015 1328   HGBUR MODERATE (A) 12/02/2015 1328   BILIRUBINUR NEGATIVE 12/02/2015 1328   BILIRUBINUR NEG 02/12/2014 1207   KETONESUR 15 (A) 12/02/2015 1328   PROTEINUR 100 (A) 12/02/2015 1328   UROBILINOGEN 0.2 03/06/2015 2133   NITRITE NEGATIVE 12/02/2015 1328   LEUKOCYTESUR NEGATIVE 12/02/2015 1328   Sepsis Labs: No results found for this or any previous visit (from the past 240 hour(s)).   Radiological Exams on Admission: Dg Chest 2 View  Result Date: 03/24/2016 CLINICAL DATA:  Acute onset of cough and intermittent fever. Initial encounter. EXAM: CHEST  2 VIEW COMPARISON:  Chest radiograph performed 06/23/2015 FINDINGS: The lungs are hyperexpanded. Peribronchial thickening is noted, with increased interstitial  markings, concerning for acute atypical infection superimposed on the patient's COPD. No definite pleural effusion or pneumothorax is seen. The heart is normal in size; the mediastinal contour is within normal limits. No acute osseous abnormalities are seen. IMPRESSION: Peribronchial thickening, with increased interstitial markings, concerning for acute atypical infection superimposed on the patient's COPD. Electronically Signed   By: Garald Balding M.D.   On: 03/24/2016 21:49    EKG: Independently reviewed. Sinus tachycardia with heart rate of 150  Assessment/Plan Sepsis suspected Community-acquired pneumonia - Admit to a MedSurg bed - f/u sputum/blood cultures and urine studies - Continue empiric antibiotics of azithromycin and ceftriaxone  - Repeat CBC in a.m.  Acute COPD exacerbation/acute on chronic respiratory failure: - Continuous pulse oximetry with [place] oxygen to keep O2 saturations greater than 92% - Duonebs QID  - Budesonide and brovana Nebs q 12hr - Solu-Medrol 60 mg IV q 12hr - COPD protocol initiated - PT to eval and treat  Constipation: Last bowel movement 1 week ago - Continue Linzess - Check abdominal x-ray  Essential hypertension - Continue amlodipine, Metoprolol, and losartan  Chronic abdominal pain - Tramadol/hydrocodone prn  Hypokalemia: Acute. Patient with potassium of 3.1 on admission given 40 mEq in the ED. - Continue to monitor and replace as needed  Anxiety/depression - Continue Paxil and Ativan prn    GERD  - Pharmacy substitution of Protonix for omeprazole   DVT prophylaxis: loveox Code Status:  Full Family Communication: No Family present at bedside Disposition Plan: discharged home in 2-3 days if respiratory status back to baseline Consults called:  none  Admission status: Observation medsurge  Norval Morton MD [facility] Pager [phone]  If 7PM-7AM, please contact night-coverage [email or web address] Password [email or web address]  03/25/2016,  2:40 AM

## 2016-03-25 NOTE — Progress Notes (Signed)
Patient seen and examined  75 y.o. female with medical history significant of COPD on 4 L of oxygen, HTN, HLD, GERD, chronic abdominal pain, nausea, and depression; who presents with complaints of progressively worsening cough and shortness of breath for the last 1- 1/2 weeks. Admitted for COPD exacerbation secondary to pneumonia. On 4 L of oxygen at baseline   Assessment and plan Sepsis suspected Community-acquired pneumonia Continue telemetry, pneumonia order set initiated - f/u sputum/blood cultures and urine studies - Continue empiric antibiotics of azithromycin and ceftriaxone  - Repeat CBC in a.m. CT chest to r/o PE   Acute COPD exacerbation/acute on chronic respiratory failure: - Continuous pulse oximetry with [place] oxygen to keep O2 saturations greater than 92% - Duonebs QID  - Budesonide and brovana Nebs q 12hr - Solu-Medrol 60 mg IV q 12hr - COPD protocol initiated - PT to eval and treat  Hyperglycemia-steroid induced, recent A1c was 5.6. Started patient on sliding scale insulin and Levemir  Constipation: Last bowel movement 1 week ago - Continue Linzess - Check abdominal x-ray  Essential hypertension - Continue amlodipine, Metoprolol, and losartan  Chronic abdominal pain - Tramadol/hydrocodone prn  Hypokalemia: Acute. Patient with potassium of 3.1 on admission given 40 mEq in the ED. - Continue to monitor and replace as needed  Anxiety/depression - Continue Paxil and Ativan prn    GERD  - Pharmacy substitution of Protonix for omeprazole

## 2016-03-26 DIAGNOSIS — R0602 Shortness of breath: Secondary | ICD-10-CM

## 2016-03-26 DIAGNOSIS — A408 Other streptococcal sepsis: Secondary | ICD-10-CM

## 2016-03-26 LAB — COMPREHENSIVE METABOLIC PANEL
ALBUMIN: 2.5 g/dL — AB (ref 3.5–5.0)
ALT: 12 U/L — ABNORMAL LOW (ref 14–54)
ANION GAP: 8 (ref 5–15)
AST: 21 U/L (ref 15–41)
Alkaline Phosphatase: 90 U/L (ref 38–126)
BUN: 22 mg/dL — ABNORMAL HIGH (ref 6–20)
CO2: 33 mmol/L — AB (ref 22–32)
Calcium: 8.9 mg/dL (ref 8.9–10.3)
Chloride: 91 mmol/L — ABNORMAL LOW (ref 101–111)
Creatinine, Ser: 0.62 mg/dL (ref 0.44–1.00)
GFR calc Af Amer: >60 mL/min (ref >60)
GFR calc non Af Amer: >60 mL/min (ref >60)
GLUCOSE: 119 mg/dL — AB (ref 65–99)
POTASSIUM: 4.6 mmol/L (ref 3.5–5.1)
SODIUM: 132 mmol/L — AB (ref 135–145)
Total Bilirubin: 0.4 mg/dL (ref 0.3–1.2)
Total Protein: 5.7 g/dL — ABNORMAL LOW (ref 6.5–8.1)

## 2016-03-26 LAB — CBC
HCT: 30 % — ABNORMAL LOW (ref 36.0–46.0)
HEMOGLOBIN: 9.5 g/dL — AB (ref 12.0–15.0)
MCH: 29.2 pg (ref 26.0–34.0)
MCHC: 31.7 g/dL (ref 30.0–36.0)
MCV: 92.3 fL (ref 78.0–100.0)
Platelets: 382 10*3/uL (ref 150–400)
RBC: 3.25 MIL/uL — ABNORMAL LOW (ref 3.87–5.11)
RDW: 12.9 % (ref 11.5–15.5)
WBC: 23.6 10*3/uL — AB (ref 4.0–10.5)

## 2016-03-26 LAB — TROPONIN I
TROPONIN I: 0.06 ng/mL — AB (ref <0.03)
Troponin I: 0.18 ng/mL (ref <0.03)

## 2016-03-26 LAB — GLUCOSE, CAPILLARY
GLUCOSE-CAPILLARY: 106 mg/dL — AB (ref 65–99)
GLUCOSE-CAPILLARY: 116 mg/dL — AB (ref 65–99)
Glucose-Capillary: 123 mg/dL — ABNORMAL HIGH (ref 65–99)
Glucose-Capillary: 155 mg/dL — ABNORMAL HIGH (ref 65–99)

## 2016-03-26 MED ORDER — ASPIRIN 81 MG PO CHEW
81.0000 mg | CHEWABLE_TABLET | Freq: Every day | ORAL | Status: DC
  Administered 2016-03-26 – 2016-03-28 (×3): 81 mg via ORAL
  Filled 2016-03-26 (×3): qty 1

## 2016-03-26 MED ORDER — SODIUM CHLORIDE 0.9% FLUSH
3.0000 mL | Freq: Two times a day (BID) | INTRAVENOUS | Status: DC
  Administered 2016-03-26: 3 mL via INTRAVENOUS

## 2016-03-26 MED ORDER — PREDNISONE 20 MG PO TABS
40.0000 mg | ORAL_TABLET | Freq: Every day | ORAL | Status: DC
  Administered 2016-03-26 – 2016-03-28 (×3): 40 mg via ORAL
  Filled 2016-03-26 (×3): qty 2

## 2016-03-26 MED ORDER — SODIUM CHLORIDE 0.9% FLUSH
3.0000 mL | Freq: Two times a day (BID) | INTRAVENOUS | Status: DC
  Administered 2016-03-26 – 2016-03-28 (×4): 3 mL via INTRAVENOUS

## 2016-03-26 MED ORDER — SODIUM CHLORIDE 0.9% FLUSH: 3.0000 mL | INTRAVENOUS | Status: DC | PRN

## 2016-03-26 MED ORDER — PREDNISONE 20 MG PO TABS: 40.0000 mg | ORAL_TABLET | Freq: Two times a day (BID) | ORAL | Status: DC

## 2016-03-26 NOTE — Progress Notes (Signed)
[facility] PROGRESS NOTE  Tamara Adams XBL:[id number] DOB: [birth year] DOA: 03/24/2016   PCP: Beatrice Lecher, MD     Assessment/Plan: Principal Problem:   Sepsis ([place]) Active Problems:   HYPERTENSION, BENIGN   Hypokalemia   Acute exacerbation of chronic obstructive pulmonary disease (COPD) ([place])   CAP (community acquired pneumonia)   Constipation   75 y.o.femalewith medical history significant of COPD on 4 L of oxygen, HTN, HLD, GERD, chronic abdominal pain, nausea, and depression;who presents with complaints of progressively worsening cough and shortness of breath for the last 1- 1/2weeks. Admitted for COPD exacerbation secondary to pneumonia. On 4 L of oxygen at baseline   Assessment and plan Sepsis suspected Community-acquired pneumonia Continue telemetry, pneumonia order set initiated - f/usputum/bloodcultures and urine studies - Continue empiric antibiotics of azithromycin and ceftriaxone  - Repeat CBC in a.m. CT chest did not show pulmonary embolism, confirms pneumonia, 24m nodule that needs follow-up CT scan in 3-6 months Worsening leukocytosis likely secondary to steroids  Acute COPD exacerbation/acute on chronic respiratory failure: - Continuous pulse oximetry with NCoxygen to keep O2 saturations greater than 92% - Duonebs QID  - Budesonide and brovana Nebs q 12hr - Stopped Solu-Medrol 60 mg IV q 12hr, patient is confused, switch to low-dose prednisone - COPD protocol initiated - PT eval-no PT follow-up needed Minimize sedating medications  Hyperglycemia-steroid induced, recent A1c was 5.6. Started patient on sliding scale insulin and Levemir  Constipation: Last bowel movement 1 week ago - Continue Linzess KUB shows moderate constipation, no bowel obstruction  Essential hypertension - Continue amlodipine, Metoprolol, andlosartan  Chronic abdominal pain - Tramadol/discontinued Vicodin due to confusion  Hypokalemia: Acute.  Patient with potassium of 3.1 , now 4.6 - Continue to monitor and replace as needed  Anxiety/depression - Continue Paxil and Ativan prn   GERD  - Pharmacy substitution of Protonix for omeprazole   Abnormal troponin-suspect demand ischemia in the setting of pneumonia, continue telemetry, continue to cycle cardiac enzymes, 2-D echo, placed patient on aspirin 81 a day   DVT prophylaxsis Lovenox  Code Status:  DO NOT RESUSCITATE, discussed with patient on 9/3, she expressed her wishes for no intubation or CPR     Family Communication: Discussed in detail with the patient, all imaging results, lab results explained to the patient   Disposition Plan:  PT OT eval, anticipate discharge in the next 1-2 days      Consultants:  None  Procedures:  None  Antibiotics: Anti-infectives    Start     Dose/Rate Route Frequency Ordered Stop   03/25/16 2200  cefTRIAXone (ROCEPHIN) 1 g in dextrose 5 % 50 mL IVPB     1 g 100 mL/hr over 30 Minutes Intravenous Every 24 hours 03/25/16 1244     03/25/16 2200  azithromycin (ZITHROMAX) tablet 250 mg     250 mg Oral Every 24 hours 03/25/16 1244 03/29/16 2159      HPI/Subjective: Confused last night,sob, little more oriented now   Objective: Vitals:   03/25/16 1957 03/25/16 2034 03/26/16 0433 03/26/16 0842  BP:  (!) 128/50 129/60   Pulse:  90 84   Resp:  18 18   Temp:  98.6 F (37 C) 98.3 F (36.8 C)   TempSrc:  Oral Oral   SpO2: 95% 96% 95% 95%  Weight:  36.1 kg (79 lb 9.4 oz)    Height:        Intake/Output Summary (Last 24 hours) at 03/26/16 0848 Last data  filed at 03/26/16 0653  Gross per 24 hour  Intake              960 ml  Output              650 ml  Net              310 ml    Exam:  Examination:  General exam: Appears calm and comfortable  Respiratory system: Clear to auscultation. Respiratory effort normal. Cardiovascular system: S1 & S2 heard, RRR. No JVD, murmurs, rubs, gallops or clicks. No pedal  edema. Gastrointestinal system: Abdomen is nondistended, soft and nontender. No organomegaly or masses felt. Normal bowel sounds heard. Central nervous system: Alert and oriented. No focal neurological deficits. Extremities: Symmetric 5 x 5 power. Skin: No rashes, lesions or ulcers Psychiatry: Judgement and insight appear normal. Mood & affect appropriate.     Data Reviewed: I have personally reviewed following labs and imaging studies  Micro Results Recent Results (from the past 240 hour(s))  Blood culture (routine x 2)     Status: None (Preliminary result)   Collection Time: 03/24/16 10:05 PM  Result Value Ref Range Status   Specimen Description BLOOD LEFT FOREARM  Final   Special Requests   Final    BOTTLES DRAWN AEROBIC AND ANAEROBIC AER 2.5cc ANA 2.5cc   Culture   Final    NO GROWTH < 12 HOURS Performed at [facility]    Report Status PENDING  Incomplete  Blood culture (routine x 2)     Status: None (Preliminary result)   Collection Time: 03/24/16 10:05 PM  Result Value Ref Range Status   Specimen Description BLOOD RIGHT ANTECUBITAL  Final   Special Requests   Final    BOTTLES DRAWN AEROBIC AND ANAEROBIC AER 5cC ANA 5cc   Culture   Final    NO GROWTH < 12 HOURS Performed at [facility]    Report Status PENDING  Incomplete  Culture, blood (routine x 2) Call MD if unable to obtain prior to antibiotics being given     Status: None (Preliminary result)   Collection Time: 03/25/16  3:40 AM  Result Value Ref Range Status   Specimen Description BLOOD LEFT ANTECUBITAL  Final   Special Requests BOTTLES DRAWN AEROBIC AND ANAEROBIC 5CC EA  Final   Culture NO GROWTH < 12 HOURS  Final   Report Status PENDING  Incomplete  Culture, blood (routine x 2) Call MD if unable to obtain prior to antibiotics being given     Status: None (Preliminary result)   Collection Time: 03/25/16  3:45 AM  Result Value Ref Range Status   Specimen Description BLOOD LEFT ARM  Final    Special Requests IN PEDIATRIC BOTTLE 3CC  Final   Culture NO GROWTH < 12 HOURS  Final   Report Status PENDING  Incomplete    Radiology Reports Dg Chest 2 View  Result Date: 03/24/2016 CLINICAL DATA:  Acute onset of cough and intermittent fever. Initial encounter. EXAM: CHEST  2 VIEW COMPARISON:  Chest radiograph performed 06/23/2015 FINDINGS: The lungs are hyperexpanded. Peribronchial thickening is noted, with increased interstitial markings, concerning for acute atypical infection superimposed on the patient's COPD. No definite pleural effusion or pneumothorax is seen. The heart is normal in size; the mediastinal contour is within normal limits. No acute osseous abnormalities are seen. IMPRESSION: Peribronchial thickening, with increased interstitial markings, concerning for acute atypical infection superimposed on the patient's COPD. Electronically Signed   By:  Garald Balding M.D.   On: 03/24/2016 21:49   Ct Angio Chest Pe W Or Wo Contrast  Result Date: 03/25/2016 CLINICAL DATA:  Worsening cough, shortness breath, and hypoxia for past 10 days. COPD. EXAM: CT ANGIOGRAPHY CHEST WITH CONTRAST TECHNIQUE: Multidetector CT imaging of the chest was performed using the standard protocol during bolus administration of intravenous contrast. Multiplanar CT image reconstructions and MIPs were obtained to evaluate the vascular anatomy. CONTRAST:  100 mL Isovue 370 COMPARISON:  01/11/2015 FINDINGS: Cardiovascular: Satisfactory opacification of pulmonary arteries noted, and no pulmonary emboli identified. No evidence of thoracic aortic dissection or aneurysm. Aortic atherosclerosis. Normal heart size. No evidence pericardial effusion . Mediastinum/Lymph Nodes: No masses or pathologically enlarged lymph nodes identified. Lungs/Pleura: Severe centrilobular emphysema again noted. New airspace disease is seen in both lower lobes, right greater than left, suspicious for pneumonia. Cluster of new solid-appearing nodular  densities are seen in the inferior aspect of right middle lobe, largest measuring 8 mm on image 123/8. These are likely infectious or inflammatory in etiology, with neoplasm considered less likely. No evidence of pleural effusion. Upper abdomen: No acute findings. Musculoskeletal: No chest wall mass or suspicious bone lesions identified. Review of the MIP images confirms the above findings. IMPRESSION: No evidence of pulmonary embolism. New bilateral lower lobe airspace disease, right side greater than left, suspicious for pneumonia. Clustered sub-cm nodular densities seen in inferior right middle lobe, largest measuring 8 mm, also likely infectious or inflammatory in etiology. Recommend follow-up chest CT in 2-3 months to confirm resolution. Severe centrilobular emphysema. Aortic atherosclerosis. Electronically Signed   By: Earle Gell M.D.   On: 03/25/2016 13:32   Dg Abd Portable 1v  Result Date: 03/25/2016 CLINICAL DATA:  Abdominal pain and constipation. EXAM: PORTABLE ABDOMEN - 1 VIEW COMPARISON:  12/05/2015 FINDINGS: Moderate stool in the colon without evidence of bowel obstruction or significant ileus. No free air. No abnormal calcifications. Clips present from prior cholecystectomy. Spondylosis and scoliosis of the lumbar spine present. IMPRESSION: Moderate fecal material.  No evidence of bowel obstruction. Electronically Signed   By: Aletta Edouard M.D.   On: 03/25/2016 08:13     CBC  Recent Labs Lab 03/24/16 2215 03/25/16 0403 03/26/16 0309  WBC 12.0* 9.3 23.6*  HGB 10.9* 10.1* 9.5*  HCT 33.0* 31.1* 30.0*  PLT 339 300 382  MCV 91.7 90.7 92.3  MCH 30.3 29.4 29.2  MCHC 33.0 32.5 31.7  RDW 12.5 12.9 12.9    Chemistries   Recent Labs Lab 03/24/16 2215 03/25/16 0403 03/26/16 0309  NA 135 135 132*  K 3.1* 3.5 4.6  CL 90* 92* 91*  CO2 35* 34* 33*  GLUCOSE 145* 237* 119*  BUN 14 9 22*  CREATININE 0.70 0.57 0.62  CALCIUM 8.6* 8.6* 8.9  AST  --   --  21  ALT  --   --  12*   ALKPHOS  --   --  90  BILITOT  --   --  0.4   ------------------------------------------------------------------------------------------------------------------ estimated creatinine clearance is 35.2 mL/min (by C-G formula based on SCr of 0.8 mg/dL). ------------------------------------------------------------------------------------------------------------------ No results for input(s): HGBA1C in the last 72 hours. ------------------------------------------------------------------------------------------------------------------ No results for input(s): CHOL, HDL, LDLCALC, TRIG, CHOLHDL, LDLDIRECT in the last 72 hours. ------------------------------------------------------------------------------------------------------------------ No results for input(s): TSH, T4TOTAL, T3FREE, THYROIDAB in the last 72 hours.  Invalid input(s): FREET3 ------------------------------------------------------------------------------------------------------------------ No results for input(s): VITAMINB12, FOLATE, FERRITIN, TIBC, IRON, RETICCTPCT in the last 72 hours.  Coagulation profile No results for input(s): INR,  PROTIME in the last 168 hours.  No results for input(s): DDIMER in the last 72 hours.  Cardiac Enzymes  Recent Labs Lab 03/24/16 2215 03/25/16 0949 03/25/16 1530  TROPONINI <0.03 <0.03 0.35*   ------------------------------------------------------------------------------------------------------------------ Invalid input(s): POCBNP   CBG:  Recent Labs Lab 03/25/16 1127 03/25/16 1635 03/25/16 2031 03/26/16 0804  GLUCAP 142* 267* 139* 106*       Studies: Dg Chest 2 View  Result Date: 03/24/2016 CLINICAL DATA:  Acute onset of cough and intermittent fever. Initial encounter. EXAM: CHEST  2 VIEW COMPARISON:  Chest radiograph performed 06/23/2015 FINDINGS: The lungs are hyperexpanded. Peribronchial thickening is noted, with increased interstitial markings, concerning for acute  atypical infection superimposed on the patient's COPD. No definite pleural effusion or pneumothorax is seen. The heart is normal in size; the mediastinal contour is within normal limits. No acute osseous abnormalities are seen. IMPRESSION: Peribronchial thickening, with increased interstitial markings, concerning for acute atypical infection superimposed on the patient's COPD. Electronically Signed   By: Garald Balding M.D.   On: 03/24/2016 21:49   Ct Angio Chest Pe W Or Wo Contrast  Result Date: 03/25/2016 CLINICAL DATA:  Worsening cough, shortness breath, and hypoxia for past 10 days. COPD. EXAM: CT ANGIOGRAPHY CHEST WITH CONTRAST TECHNIQUE: Multidetector CT imaging of the chest was performed using the standard protocol during bolus administration of intravenous contrast. Multiplanar CT image reconstructions and MIPs were obtained to evaluate the vascular anatomy. CONTRAST:  100 mL Isovue 370 COMPARISON:  01/11/2015 FINDINGS: Cardiovascular: Satisfactory opacification of pulmonary arteries noted, and no pulmonary emboli identified. No evidence of thoracic aortic dissection or aneurysm. Aortic atherosclerosis. Normal heart size. No evidence pericardial effusion . Mediastinum/Lymph Nodes: No masses or pathologically enlarged lymph nodes identified. Lungs/Pleura: Severe centrilobular emphysema again noted. New airspace disease is seen in both lower lobes, right greater than left, suspicious for pneumonia. Cluster of new solid-appearing nodular densities are seen in the inferior aspect of right middle lobe, largest measuring 8 mm on image 123/8. These are likely infectious or inflammatory in etiology, with neoplasm considered less likely. No evidence of pleural effusion. Upper abdomen: No acute findings. Musculoskeletal: No chest wall mass or suspicious bone lesions identified. Review of the MIP images confirms the above findings. IMPRESSION: No evidence of pulmonary embolism. New bilateral lower lobe airspace  disease, right side greater than left, suspicious for pneumonia. Clustered sub-cm nodular densities seen in inferior right middle lobe, largest measuring 8 mm, also likely infectious or inflammatory in etiology. Recommend follow-up chest CT in 2-3 months to confirm resolution. Severe centrilobular emphysema. Aortic atherosclerosis. Electronically Signed   By: Earle Gell M.D.   On: 03/25/2016 13:32   Dg Abd Portable 1v  Result Date: 03/25/2016 CLINICAL DATA:  Abdominal pain and constipation. EXAM: PORTABLE ABDOMEN - 1 VIEW COMPARISON:  12/05/2015 FINDINGS: Moderate stool in the colon without evidence of bowel obstruction or significant ileus. No free air. No abnormal calcifications. Clips present from prior cholecystectomy. Spondylosis and scoliosis of the lumbar spine present. IMPRESSION: Moderate fecal material.  No evidence of bowel obstruction. Electronically Signed   By: Aletta Edouard M.D.   On: 03/25/2016 08:13      Lab Results  Component Value Date   HGBA1C 5.6 02/16/2016   Lab Results  Component Value Date   LDLCALC 148 (H) 10/14/2012   CREATININE 0.62 03/26/2016       Scheduled Meds: . amLODipine  5 mg Oral Daily  . arformoterol  15 mcg Nebulization BID  . azithromycin  250 mg Oral Q24H  . budesonide (PULMICORT) nebulizer solution  0.5 mg Nebulization BID  . cefTRIAXone (ROCEPHIN)  IV  1 g Intravenous Q24H  . dicyclomine  20 mg Oral TID AC & HS  . enoxaparin (LOVENOX) injection  20 mg Subcutaneous Q24H  . feeding supplement (ENSURE ENLIVE)  237 mL Oral BID BM  . insulin aspart  0-9 Units Subcutaneous TID WC  . insulin detemir  10 Units Subcutaneous QHS  . ipratropium-albuterol  3 mL Nebulization QID  . linaclotide  290 mcg Oral Daily  . losartan  100 mg Oral Daily  . magnesium oxide  400 mg Oral Daily  . metoprolol succinate  50 mg Oral Daily  . multivitamin with minerals  1 tablet Oral Daily  . pantoprazole  40 mg Oral BID  . PARoxetine  20 mg Oral Daily  .  potassium chloride  10 mEq Oral Daily  . predniSONE  40 mg Oral BID   Continuous Infusions:    LOS: 1 day    Time spent: >30 MINS    [facility]  [facility] Pager [phone]. If 7PM-7AM, please contact night-coverage at [email or web address], password [facility] 03/26/2016, 8:48 AM  LOS: 1 day

## 2016-03-26 NOTE — Care Management Note (Signed)
Case Management Note  Patient Details  Name: Tamara Adams MRN: [id number] Date of Birth: [birth year]  Subjective/Objective:        CM following for progression and d/c planning.             Action/Plan: Unclear as to actual d/c plan at this time. Currently hallucinating unable to plan or understand planning process.   Expected Discharge Date:                  Expected Discharge Plan:     In-House Referral:  Clinical Social Work  Discharge planning Services  CM Consult  Post Acute Care Choice:    Choice offered to:     DME Arranged:    DME Agency:     HH Arranged:    HH Agency:     Status of Service:  In process, will continue to follow  If discussed at Long Length of Stay Meetings, dates discussed:    Additional Comments:  Adron Bene, RN 03/26/2016, 1:30 PM

## 2016-03-26 NOTE — Progress Notes (Signed)
Patient experiencing symptoms of delerium and hallucinating re:  Family members being present in hospital.  Requires frequent re-orientation to situation and place, but is clearly oriented to time and person.  Is impulsive and lacks appropriate safety awareness.  Bed alarm on.  MD alerted and Vicodin discontinued from medication list.  Will consider moving patient to camera room as one becomes available for her.

## 2016-03-26 NOTE — Progress Notes (Signed)
Physical Therapy Treatment Patient Details Name: Tamara Adams MRN: [id number] DOB: 09/23/1940 Today's Date: 03/26/2016    History of Present Illness Patient is a 75 yo female admitted 03/24/16 with cough and SOB.  Patient with COPD exacerbation and pna.    PMH:  COPD with home O2 at 4 L/min, DM, HTN, HLD, chronic abdominal pain, nausea, depression, anxiety    PT Comments    Noting good progress with activity tolerance; Have updated equipment recs to getting a RW to decr the work of walking (rollator would be beneficial), and HHPT follow up at dc  Follow Up Recommendations  HHPT;Supervision - Intermittent     Equipment Recommendations  Rolling walker with 5" wheels Rollator   Recommendations for Other Services       Precautions / Restrictions Precautions Precautions: Fall Precaution Comments: On O2 at 4 L/min Restrictions Weight Bearing Restrictions: No    Mobility  Bed Mobility Overal bed mobility: Modified Independent                Transfers Overall transfer level: Needs assistance Equipment used: None Transfers: Sit to/from Stand Sit to Stand: Supervision         General transfer comment: Good balance in static stance.  Supervision for safety only.  Ambulation/Gait Ambulation/Gait assistance: Min assist;Supervision Ambulation Distance (Feet): 150 Feet Assistive device: 1 person hand held assist;Rolling walker (2 wheeled) Gait Pattern/deviations: Step-through pattern;Decreased step length - right;Decreased step length - left;Decreased stride length     General Gait Details: Cues to self-monitor for activi ty tolerance; initial walk with handheld assist and noted pt tending to reach out for UE support also from hallway rail; used RW latter part of walk, and did well   Stairs            Wheelchair Mobility    Modified Rankin (Stroke Patients Only)       Balance             Standing balance-Leahy Scale: Good                       Cognition Arousal/Alertness: Awake/alert Behavior During Therapy: WFL for tasks assessed/performed Overall Cognitive Status: Within Functional Limits for tasks assessed                      Exercises      General Comments        Pertinent Vitals/Pain Pain Assessment: No/denies pain    Home Living                      Prior Function            PT Goals (current goals can now be found in the care plan section) Acute Rehab PT Goals Patient Stated Goal: To go home PT Goal Formulation: With patient Time For Goal Achievement: 04/01/16 Potential to Achieve Goals: Good Progress towards PT goals: Progressing toward goals    Frequency  Min 3X/week    PT Plan Current plan remains appropriate    Co-evaluation             End of Session Equipment Utilized During Treatment: Gait belt;Oxygen Activity Tolerance: Patient tolerated treatment well Patient left: in chair;with call bell/phone within reach;with chair alarm set     Time: 2409-7353 PT Time Calculation (min) (ACUTE ONLY): 20 min  Charges:  $Gait Training: 8-22 mins  G Codes:      Roney Marion Hamff 03/26/2016, 4:12 PM  Roney Marion, [place] Pager [phone] Office [phone]

## 2016-03-26 NOTE — Progress Notes (Signed)
Pt with attempts to get OOB unassisted and states that her "granddaughter is out in the hallway waiting for her". Pt redirected and reminded that she was at the hospital. 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 Bed alarm remains activated. Will continue to monitor closely. Dorthey Sawyer, RN

## 2016-03-27 LAB — GLUCOSE, CAPILLARY
GLUCOSE-CAPILLARY: 146 mg/dL — AB (ref 65–99)
Glucose-Capillary: 45 mg/dL — ABNORMAL LOW (ref 65–99)
Glucose-Capillary: 82 mg/dL (ref 65–99)
Glucose-Capillary: 170 mg/dL — ABNORMAL HIGH (ref 65–99)
Glucose-Capillary: 85 mg/dL (ref 65–99)

## 2016-03-27 LAB — CBC
HCT: 31.3 % — ABNORMAL LOW (ref 36.0–46.0)
HEMOGLOBIN: 10.1 g/dL — AB (ref 12.0–15.0)
MCH: 28.9 pg (ref 26.0–34.0)
MCHC: 32.3 g/dL (ref 30.0–36.0)
MCV: 89.4 fL (ref 78.0–100.0)
PLATELETS: 462 10*3/uL — AB (ref 150–400)
RBC: 3.5 MIL/uL — ABNORMAL LOW (ref 3.87–5.11)
RDW: 12.9 % (ref 11.5–15.5)
WBC: 25.9 10*3/uL — ABNORMAL HIGH (ref 4.0–10.5)

## 2016-03-27 LAB — COMPREHENSIVE METABOLIC PANEL
ALBUMIN: 2.7 g/dL — AB (ref 3.5–5.0)
ALK PHOS: 88 U/L (ref 38–126)
ALT: 14 U/L (ref 14–54)
ANION GAP: 6 (ref 5–15)
AST: 23 U/L (ref 15–41)
BUN: 15 mg/dL (ref 6–20)
CHLORIDE: 87 mmol/L — AB (ref 101–111)
CO2: 36 mmol/L — AB (ref 22–32)
Calcium: 9 mg/dL (ref 8.9–10.3)
Creatinine, Ser: 0.46 mg/dL (ref 0.44–1.00)
GFR calc non Af Amer: >60 mL/min (ref >60)
GLUCOSE: 63 mg/dL — AB (ref 65–99)
POTASSIUM: 4.2 mmol/L (ref 3.5–5.1)
SODIUM: 129 mmol/L — AB (ref 135–145)
Total Bilirubin: 0.1 mg/dL — ABNORMAL LOW (ref 0.3–1.2)
Total Protein: 5.8 g/dL — ABNORMAL LOW (ref 6.5–8.1)

## 2016-03-27 LAB — LEGIONELLA PNEUMOPHILA SEROGP 1 UR AG: L. PNEUMOPHILA SEROGP 1 UR AG: NEGATIVE

## 2016-03-27 LAB — HEMOGLOBIN A1C
Hgb A1c MFr Bld: 5.9 % — ABNORMAL HIGH (ref 4.8–5.6)
Mean Plasma Glucose: 123 mg/dL

## 2016-03-27 LAB — T4, FREE: Free T4: 1.11 ng/dL (ref 0.61–1.12)

## 2016-03-27 LAB — TSH: TSH: 0.714 u[IU]/mL (ref 0.350–4.500)

## 2016-03-27 MED ORDER — SODIUM CHLORIDE 0.9 % IV SOLN
INTRAVENOUS | Status: AC
  Administered 2016-03-27: 75 mL via INTRAVENOUS

## 2016-03-27 MED ORDER — DEXTROSE 50 % IV SOLN
INTRAVENOUS | Status: AC
  Administered 2016-03-27: 50 mL
  Filled 2016-03-27: qty 50

## 2016-03-27 NOTE — Progress Notes (Signed)
Physical Therapy Treatment Patient Details Name: Tamara Adams MRN: [id number] DOB: April 17, 1941 Today's Date: 03/27/2016    History of Present Illness Patient is a 75 yo female admitted 03/24/16 with cough and SOB.  Patient with COPD exacerbation and pna.    PMH:  COPD with home O2 at 4 L/min, DM, HTN, HLD, chronic abdominal pain, nausea, depression, anxiety    PT Comments    Continuing progress with functional mobility and activity tolerance; use of the RW decreases the overall work of walking and pt seems to like it;   SATURATION QUALIFICATIONS: (This note is used to comply with regulatory documentation for home oxygen)  Patient Saturations on Room Air at Rest = 90%  Patient Saturations on Room Air while Ambulating = 87%  Patient Saturations on 4 Liters of oxygen while Ambulating = 98%  Please briefly explain why patient needs home oxygen: Patient requires supplemental oxygen to maintain oxygen saturations at acceptable, safe levels with physical activity.   Follow Up Recommendations  No PT follow up;Supervision - Intermittent     Equipment Recommendations  Rolling walker with 5" wheels    Recommendations for Other Services       Precautions / Restrictions Precautions Precautions: Fall Precaution Comments: On O2 at 4 L/min    Mobility  Bed Mobility Overal bed mobility: Modified Independent                Transfers Overall transfer level: Needs assistance Equipment used: Rolling walker (2 wheeled) Transfers: Sit to/from Stand Sit to Stand: Supervision         General transfer comment: Good balance in static stance.  Supervision for safety only.  Ambulation/Gait Ambulation/Gait assistance: Supervision Ambulation Distance (Feet): 100 Feet Assistive device: Rolling walker (2 wheeled) Gait Pattern/deviations: Step-through pattern;Decreased step length - right;Decreased step length - left     General Gait Details: Cues to self-monitor for activity  tolerance   Stairs            Wheelchair Mobility    Modified Rankin (Stroke Patients Only)       Balance             Standing balance-Leahy Scale: Good                      Cognition Arousal/Alertness: Awake/alert Behavior During Therapy: WFL for tasks assessed/performed Overall Cognitive Status: Within Functional Limits for tasks assessed                      Exercises      General Comments        Pertinent Vitals/Pain Pain Assessment: No/denies pain    Home Living                      Prior Function            PT Goals (current goals can now be found in the care plan section) Acute Rehab PT Goals Patient Stated Goal: To go home PT Goal Formulation: With patient Time For Goal Achievement: 04/01/16 Potential to Achieve Goals: Good Progress towards PT goals: Progressing toward goals    Frequency  Min 3X/week    PT Plan Current plan remains appropriate    Co-evaluation             End of Session Equipment Utilized During Treatment: Gait belt;Oxygen Activity Tolerance: Patient tolerated treatment well Patient left: in bed;with call bell/phone within reach;with bed alarm set  Time: 3202-3343 PT Time Calculation (min) (ACUTE ONLY): 15 min  Charges:  $Gait Training: 8-22 mins                    G Codes:      Quin Hoop 03/27/2016, 4:32 PM   Roney Marion, [place] Pager [phone] Office [phone]

## 2016-03-27 NOTE — Progress Notes (Signed)
Occupational Therapy Evaluation Patient Details Name: Tamara Adams MRN: [id number] DOB: 08-23-1940 Today's Date: 03/27/2016    History of Present Illness Patient is a 75 yo female admitted 03/24/16 with cough and SOB.  Patient with COPD exacerbation and pna.    PMH:  COPD with home O2 at 4 L/min, DM, HTN, HLD, chronic abdominal pain, nausea, depression, anxiety   Clinical Impression   Patient presents to OT with decreased ADL independence and safety due to the deficits listed below. Will benefit from skilled OT to maximize function and to facilitate a safe discharge. OT will follow.    Follow Up Recommendations  Home health OT;Supervision/Assistance - 24 hour    Equipment Recommendations  None recommended by OT    Recommendations for Other Services       Precautions / Restrictions Precautions Precautions: Fall Precaution Comments: On O2 at 4 L/min Restrictions Weight Bearing Restrictions: No      Mobility Bed Mobility Overal bed mobility: Modified Independent             General bed mobility comments: Increased time  Transfers Overall transfer level: Needs assistance Equipment used: None Transfers: Sit to/from Stand Sit to Stand: Supervision         General transfer comment: Good balance in static stance.  Supervision for safety only.    Balance                                            ADL Overall ADL's : Needs assistance/impaired Eating/Feeding: Independent;Sitting   Grooming: Wash/dry hands;Min guard;Standing           Upper Body Dressing : Set up;Sitting   Lower Body Dressing: Minimal assistance;Sit to/from stand Lower Body Dressing Details (indicate cue type and reason): no difficulty don/doff socks, good flexibility Toilet Transfer: Min guard;Ambulation;Regular Museum/gallery exhibitions officer and Hygiene: Min guard;Sit to/from stand       Functional mobility during ADLs: Min guard General ADL Comments:  Patient reported she had a stomach ache, but willing to work with OT a little bit. She practiced bed mobility, transfer, ambulation to bathroom, toilet transfer, toileting, grooming in standing at sink, and LB self-care. Returned to bed at end of session with bed alarm in place.      Vision     Perception     Praxis      Pertinent Vitals/Pain Pain Assessment: Faces Faces Pain Scale: Hurts a little bit Pain Location: stomach Pain Descriptors / Indicators: Aching Pain Intervention(s): Monitored during session     Hand Dominance     Extremity/Trunk Assessment Upper Extremity Assessment Upper Extremity Assessment: Generalized weakness   Lower Extremity Assessment Lower Extremity Assessment: Defer to PT evaluation   Cervical / Trunk Assessment Cervical / Trunk Assessment: Kyphotic   Communication Communication Communication: No difficulties   Cognition Arousal/Alertness: Awake/alert Behavior During Therapy: WFL for tasks assessed/performed Overall Cognitive Status: Within Functional Limits for tasks assessed                     General Comments       Exercises       Shoulder Instructions      Home Living Family/patient expects to be discharged to:: Private residence Living Arrangements: Spouse/significant other;Children;Other relatives Available Help at Discharge: Family;Available 24 hours/day Type of Home: House Home Access: Level entry     Home Layout:  One level     Bathroom Shower/Tub: Tub/shower unit Shower/tub characteristics: Curtain Biochemist, clinical: Handicapped height     Home Equipment: Environmental consultant - 2 wheels;Shower seat;Grab bars - tub/shower;Hand held shower head   Additional Comments: Husband has decreased cognition from CVA per patient      Prior Functioning/Environment Level of Independence: Independent;Needs assistance  Gait / Transfers Assistance Needed: Independent ADL's / Homemaking Assistance Needed: Family assists with meal prep  and housekeeping        OT Diagnosis: Generalized weakness   OT Problem List: Decreased strength;Decreased activity tolerance;Impaired balance (sitting and/or standing);Decreased knowledge of use of DME or AE;Pain   OT Treatment/Interventions: Self-care/ADL training;Energy conservation;DME and/or AE instruction;Therapeutic activities;Patient/family education    OT Goals(Current goals can be found in the care plan section) Acute Rehab OT Goals Patient Stated Goal: To go home OT Goal Formulation: With patient Time For Goal Achievement: 04/10/16 Potential to Achieve Goals: Good ADL Goals Pt Will Perform Upper Body Bathing: with modified independence;sitting Pt Will Perform Lower Body Bathing: with modified independence;with adaptive equipment;sit to/from stand Pt Will Perform Upper Body Dressing: with modified independence;sitting Pt Will Perform Lower Body Dressing: with modified independence;with adaptive equipment;sit to/from stand Pt Will Transfer to Toilet: with modified independence;ambulating;regular height toilet Pt Will Perform Toileting - Clothing Manipulation and hygiene: with modified independence;sit to/from stand Pt Will Perform Tub/Shower Transfer: Tub transfer;with min guard assist;shower seat Additional ADL Goal #1: Patient will utilize 3 energy conservation techniques during BADL without cues to initiate.  OT Frequency: Min 2X/week   Barriers to D/C:            Co-evaluation              End of Session Equipment Utilized During Treatment: Oxygen  Activity Tolerance: Patient tolerated treatment well Patient left: in bed;with call bell/phone within reach;with bed alarm set   Time: 0950-1000 OT Time Calculation (min): 10 min Charges:  OT General Charges $OT Visit: 1 Procedure OT Evaluation $OT Eval Low Complexity: 1 Procedure G-Codes:    Merl Guardino A 2016-04-03, 11:15 AM

## 2016-03-27 NOTE — Care Management Note (Signed)
Case Management Note  Patient Details  Name: Tamara Adams MRN: [id number] Date of Birth: 1941/07/05  Subjective/Objective:         CM following for progression and d/c planning.            Action/Plan: 03/27/2016 Met with pt who states that her husband has used [facility] in the past and she would like to use that agency for [facility] services as she was very pleased with their service.  [place] notified. Pt states that she does not need a rolling walker as ordered by the MD. [facility] advised not to deliver the walker.   Expected Discharge Date:    03/28/2016              Expected Discharge Plan:  [place]  In-House Referral:     Discharge planning Services  CM Consult  Post Acute Care Choice:  Home Health Choice offered to:  Patient  DME Arranged:   NA DME Agency:   NA  HH Arranged:  PT, OT, Nurse's Aide [place] Agency:  [place]  Status of Service:  Completed, signed off  If discussed at [place] of Stay Meetings, dates discussed:    Additional Comments:  Adron Bene, RN 03/27/2016, 10:33 AM

## 2016-03-27 NOTE — Progress Notes (Signed)
Pt with intermittent confusion and several attempts to "go to the kitchen". Pt remains able to state her name, DOB, the season, and year but intermittently believes herself to be in her home. She has made several attempts to get OOB unassisted, however, pt's gate is unsteady independently. Pt is easily redirected but then forgets again within 10 - 15 min. Pt transferring to room 6E14 for camera monitoring

## 2016-03-27 NOTE — Progress Notes (Signed)
[facility] PROGRESS NOTE  Shervon Kerwin IFO:[id number] DOB: [birth year] DOA: 03/24/2016   PCP: Beatrice Lecher, MD     Assessment/Plan: Principal Problem:   Sepsis ([place]) Active Problems:   HYPERTENSION, BENIGN   Hypokalemia   Acute exacerbation of chronic obstructive pulmonary disease (COPD) ([place])   CAP (community acquired pneumonia)   Constipation   SOB (shortness of breath)   75 y.o.femalewith medical history significant of COPD on 4 L of oxygen, HTN, HLD, GERD, chronic abdominal pain, nausea, and depression;who presents with complaints of progressively worsening cough and shortness of breath for the last 1- 1/2weeks. Admitted for COPD exacerbation secondary to pneumonia. On 4 L of oxygen at baseline   Assessment and plan Sepsis suspected Community-acquired pneumonia Continue telemetry, pneumonia order set initiated - f/usputum/bloodcultures and urine studies - Continue empiric antibiotics of azithromycin and ceftriaxone  - Repeat CBC in a.m. CT chest did not show pulmonary embolism, confirms pneumonia, 82m nodule that needs follow-up CT scan in 3-6 months. No empyema either  Worsening leukocytosis likely secondary to steroids, white blood cell count is still elevated, however patient afebrile, no concern for empyema. 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 Started patient on sliding scale insulin. Discontinued Levemir secondary to hypoglycemia last night  Constipation: Last bowel movement 1 week ago - Continue Linzess KUB shows moderate constipation, no bowel obstruction  Essential hypertension - Continue amlodipine, Metoprolol, andlosartan  Chronic abdominal pain - Tramadol/discontinued Vicodin due to confusion  Hypokalemia: Acute. Patient with potassium of 3.1 , now 4.6 - Continue to monitor and replace as needed  Anxiety/depression - Continue Paxil and Ativan prn   GERD  - Pharmacy substitution of Protonix for omeprazole   Abnormal troponin-suspect demand ischemia in the setting of pneumonia, continue telemetry, continue to cycle cardiac enzymes, 2-D echo pending, placed patient on aspirin 81 a day   DVT prophylaxsis Lovenox  Code Status:  DO NOT RESUSCITATE, discussed with patient on 9/3, she expressed her wishes for no intubation or CPR     Family Communication: Discussed in detail with the patient, all imaging results, lab results explained to the patient   Disposition Plan:  PT OT eval, anticipate discharge in the next 1-2 days      Consultants:  None  Procedures:  None  Antibiotics: Anti-infectives    Start     Dose/Rate Route Frequency Ordered Stop   03/25/16 2200  cefTRIAXone (ROCEPHIN) 1 g in dextrose 5 % 50 mL IVPB     1 g 100 mL/hr over 30 Minutes Intravenous Every 24 hours 03/25/16 1244     03/25/16 2200  azithromycin (ZITHROMAX) tablet 250 mg     250 mg Oral Every 24 hours 03/25/16 1244 03/29/16 2159      HPI/Subjective: Patient continues to be weak however shortness of breath is better  Objective: Vitals:   03/26/16 1823 03/26/16 1948 03/26/16 2152 03/27/16 0537  BP: (Marland Kitchen  149/69 (!) 141/72  137/71  Pulse: 85 81  86  Resp: '18 18  19  '$ Temp: 97.7 F (36.5 C) 98.7 F (37.1 C)  98.6 F (37 C)  TempSrc: Oral Oral  Oral  SpO2: 95% 100% 100% 90%  Weight:  35.2 kg (77 lb 9.6 oz)     Height:        Intake/Output Summary (Last 24 hours) at 03/27/16 0919 Last data filed at 03/27/16 0600  Gross per 24 hour  Intake              460 ml  Output               50 ml  Net              410 ml    Exam:  Examination:  General exam: Appears calm and comfortable  Respiratory system: Clear to auscultation. Respiratory effort normal. Cardiovascular system: S1 & S2 heard, RRR. No JVD, murmurs, rubs, gallops or clicks. No pedal edema. Gastrointestinal system: Abdomen is nondistended, soft and nontender. No organomegaly or masses felt. Normal bowel sounds heard. Central nervous system: Alert and oriented. No focal neurological deficits. Extremities: Symmetric 5 x 5 power. Skin: No rashes, lesions or ulcers Psychiatry: Judgement and insight appear normal. Mood & affect appropriate.     Data Reviewed: I have personally reviewed following labs and imaging studies  Micro Results Recent Results (from the past 240 hour(s))  Blood culture (routine x 2)     Status: None (Preliminary result)   Collection Time: 03/24/16 10:05 PM  Result Value Ref Range Status   Specimen Description BLOOD LEFT FOREARM  Final   Special Requests   Final    BOTTLES DRAWN AEROBIC AND ANAEROBIC AER 2.5cc ANA 2.5cc   Culture   Final    NO GROWTH 1 DAY Performed at [facility]    Report Status PENDING  Incomplete  Blood culture (routine x 2)     Status: None (Preliminary result)   Collection Time: 03/24/16 10:05 PM  Result Value Ref Range Status   Specimen Description BLOOD RIGHT ANTECUBITAL  Final   Special Requests   Final    BOTTLES DRAWN AEROBIC AND ANAEROBIC AER 5cC ANA 5cc   Culture   Final    NO GROWTH 1 DAY Performed at [facility]    Report Status PENDING  Incomplete  Culture, blood (routine x 2) Call MD if unable to obtain prior to antibiotics being given     Status: None (Preliminary result)   Collection Time: 03/25/16  3:40 AM  Result Value Ref Range Status    Specimen Description BLOOD LEFT ANTECUBITAL  Final   Special Requests BOTTLES DRAWN AEROBIC AND ANAEROBIC 5CC EA  Final   Culture NO GROWTH 1 DAY  Final   Report Status PENDING  Incomplete  Culture, blood (routine x 2) Call MD if unable to obtain prior to antibiotics being given     Status: None (Preliminary result)   Collection Time: 03/25/16  3:45 AM  Result Value Ref Range Status   Specimen Description BLOOD LEFT ARM  Final   Special Requests IN PEDIATRIC BOTTLE 3CC  Final   Culture NO GROWTH 1 DAY  Final   Report Status PENDING  Incomplete    Radiology Reports Dg Chest 2 View  Result Date: 03/24/2016 CLINICAL DATA:  Acute onset of cough and intermittent fever. Initial encounter. EXAM: CHEST  2 VIEW COMPARISON:  Chest radiograph performed 06/23/2015 FINDINGS:  The lungs are hyperexpanded. Peribronchial thickening is noted, with increased interstitial markings, concerning for acute atypical infection superimposed on the patient's COPD. No definite pleural effusion or pneumothorax is seen. The heart is normal in size; the mediastinal contour is within normal limits. No acute osseous abnormalities are seen. IMPRESSION: Peribronchial thickening, with increased interstitial markings, concerning for acute atypical infection superimposed on the patient's COPD. Electronically Signed   By: Garald Balding M.D.   On: 03/24/2016 21:49   Ct Angio Chest Pe W Or Wo Contrast  Result Date: 03/25/2016 CLINICAL DATA:  Worsening cough, shortness breath, and hypoxia for past 10 days. COPD. EXAM: CT ANGIOGRAPHY CHEST WITH CONTRAST TECHNIQUE: Multidetector CT imaging of the chest was performed using the standard protocol during bolus administration of intravenous contrast. Multiplanar CT image reconstructions and MIPs were obtained to evaluate the vascular anatomy. CONTRAST:  100 mL Isovue 370 COMPARISON:  01/11/2015 FINDINGS: Cardiovascular: Satisfactory opacification of pulmonary arteries noted, and no pulmonary  emboli identified. No evidence of thoracic aortic dissection or aneurysm. Aortic atherosclerosis. Normal heart size. No evidence pericardial effusion . Mediastinum/Lymph Nodes: No masses or pathologically enlarged lymph nodes identified. Lungs/Pleura: Severe centrilobular emphysema again noted. New airspace disease is seen in both lower lobes, right greater than left, suspicious for pneumonia. Cluster of new solid-appearing nodular densities are seen in the inferior aspect of right middle lobe, largest measuring 8 mm on image 123/8. These are likely infectious or inflammatory in etiology, with neoplasm considered less likely. No evidence of pleural effusion. Upper abdomen: No acute findings. Musculoskeletal: No chest wall mass or suspicious bone lesions identified. Review of the MIP images confirms the above findings. IMPRESSION: No evidence of pulmonary embolism. New bilateral lower lobe airspace disease, right side greater than left, suspicious for pneumonia. Clustered sub-cm nodular densities seen in inferior right middle lobe, largest measuring 8 mm, also likely infectious or inflammatory in etiology. Recommend follow-up chest CT in 2-3 months to confirm resolution. Severe centrilobular emphysema. Aortic atherosclerosis. Electronically Signed   By: Earle Gell M.D.   On: 03/25/2016 13:32   Dg Abd Portable 1v  Result Date: 03/25/2016 CLINICAL DATA:  Abdominal pain and constipation. EXAM: PORTABLE ABDOMEN - 1 VIEW COMPARISON:  12/05/2015 FINDINGS: Moderate stool in the colon without evidence of bowel obstruction or significant ileus. No free air. No abnormal calcifications. Clips present from prior cholecystectomy. Spondylosis and scoliosis of the lumbar spine present. IMPRESSION: Moderate fecal material.  No evidence of bowel obstruction. Electronically Signed   By: Aletta Edouard M.D.   On: 03/25/2016 08:13     CBC  Recent Labs Lab 03/24/16 2215 03/25/16 0403 03/26/16 0309 03/27/16 0542  WBC 12.0*  9.3 23.6* 25.9*  HGB 10.9* 10.1* 9.5* 10.1*  HCT 33.0* 31.1* 30.0* 31.3*  PLT 339 300 382 462*  MCV 91.7 90.7 92.3 89.4  MCH 30.3 29.4 29.2 28.9  MCHC 33.0 32.5 31.7 32.3  RDW 12.5 12.9 12.9 12.9    Chemistries   Recent Labs Lab 03/24/16 2215 03/25/16 0403 03/26/16 0309 03/27/16 0542  NA 135 135 132* 129*  K 3.1* 3.5 4.6 4.2  CL 90* 92* 91* 87*  CO2 35* 34* 33* 36*  GLUCOSE 145* 237* 119* 63*  BUN 14 9 22* 15  CREATININE 0.70 0.57 0.62 0.46  CALCIUM 8.6* 8.6* 8.9 9.0  AST  --   --  21 23  ALT  --   --  12* 14  ALKPHOS  --   --  90 88  BILITOT  --   --  0.4 0.1*   ------------------------------------------------------------------------------------------------------------------ estimated creatinine clearance is 34.3 mL/min (by C-G formula based on SCr of 0.8 mg/dL). ------------------------------------------------------------------------------------------------------------------ No results for input(s): HGBA1C in the last 72 hours. ------------------------------------------------------------------------------------------------------------------ No results for input(s): CHOL, HDL, LDLCALC, TRIG, CHOLHDL, LDLDIRECT in the last 72 hours. ------------------------------------------------------------------------------------------------------------------ No results for input(s): TSH, T4TOTAL, T3FREE, THYROIDAB in the last 72 hours.  Invalid input(s): FREET3 ------------------------------------------------------------------------------------------------------------------ No results for input(s): VITAMINB12, FOLATE, FERRITIN, TIBC, IRON, RETICCTPCT in the last 72 hours.  Coagulation profile No results for input(s): INR, PROTIME in the last 168 hours.  No results for input(s): DDIMER in the last 72 hours.  Cardiac Enzymes  Recent Labs Lab 03/25/16 1530 03/26/16 0850 03/26/16 1440  TROPONINI 0.35* 0.06* 0.18*    ------------------------------------------------------------------------------------------------------------------ Invalid input(s): POCBNP   CBG:  Recent Labs Lab 03/26/16 1202 03/26/16 1727 03/26/16 1943 03/27/16 0740 03/27/16 0846  GLUCAP 123* 116* 155* 45* 146*       Studies: Ct Angio Chest Pe W Or Wo Contrast  Result Date: 03/25/2016 CLINICAL DATA:  Worsening cough, shortness breath, and hypoxia for past 10 days. COPD. EXAM: CT ANGIOGRAPHY CHEST WITH CONTRAST TECHNIQUE: Multidetector CT imaging of the chest was performed using the standard protocol during bolus administration of intravenous contrast. Multiplanar CT image reconstructions and MIPs were obtained to evaluate the vascular anatomy. CONTRAST:  100 mL Isovue 370 COMPARISON:  01/11/2015 FINDINGS: Cardiovascular: Satisfactory opacification of pulmonary arteries noted, and no pulmonary emboli identified. No evidence of thoracic aortic dissection or aneurysm. Aortic atherosclerosis. Normal heart size. No evidence pericardial effusion . Mediastinum/Lymph Nodes: No masses or pathologically enlarged lymph nodes identified. Lungs/Pleura: Severe centrilobular emphysema again noted. New airspace disease is seen in both lower lobes, right greater than left, suspicious for pneumonia. Cluster of new solid-appearing nodular densities are seen in the inferior aspect of right middle lobe, largest measuring 8 mm on image 123/8. These are likely infectious or inflammatory in etiology, with neoplasm considered less likely. No evidence of pleural effusion. Upper abdomen: No acute findings. Musculoskeletal: No chest wall mass or suspicious bone lesions identified. Review of the MIP images confirms the above findings. IMPRESSION: No evidence of pulmonary embolism. New bilateral lower lobe airspace disease, right side greater than left, suspicious for pneumonia. Clustered sub-cm nodular densities seen in inferior right middle lobe, largest measuring  8 mm, also likely infectious or inflammatory in etiology. Recommend follow-up chest CT in 2-3 months to confirm resolution. Severe centrilobular emphysema. Aortic atherosclerosis. Electronically Signed   By: Earle Gell M.D.   On: 03/25/2016 13:32      Lab Results  Component Value Date   HGBA1C 5.6 02/16/2016   Lab Results  Component Value Date   LDLCALC 148 (H) 10/14/2012   CREATININE 0.46 03/27/2016       Scheduled Meds: . amLODipine  5 mg Oral Daily  . arformoterol  15 mcg Nebulization BID  . aspirin  81 mg Oral Daily  . azithromycin  250 mg Oral Q24H  . budesonide (PULMICORT) nebulizer solution  0.5 mg Nebulization BID  . cefTRIAXone (ROCEPHIN)  IV  1 g Intravenous Q24H  . dicyclomine  20 mg Oral TID AC & HS  . enoxaparin (LOVENOX) injection  20 mg Subcutaneous Q24H  . feeding supplement (ENSURE ENLIVE)  237 mL Oral BID BM  . insulin aspart  0-9 Units Subcutaneous TID WC  . ipratropium-albuterol  3 mL Nebulization QID  . linaclotide  290 mcg Oral Daily  . losartan  100 mg Oral Daily  . magnesium  oxide  400 mg Oral Daily  . metoprolol succinate  50 mg Oral Daily  . multivitamin with minerals  1 tablet Oral Daily  . pantoprazole  40 mg Oral BID  . PARoxetine  20 mg Oral Daily  . predniSONE  40 mg Oral Q breakfast  . sodium chloride flush  3 mL Intravenous Q12H  . sodium chloride flush  3 mL Intravenous Q12H   Continuous Infusions: . sodium chloride       LOS: 2 days    Time spent: >30 MINS    [facility]  [facility] Pager [phone]. If 7PM-7AM, please contact night-coverage at [email or web address], password [facility] 03/27/2016, 9:19 AM  LOS: 2 days

## 2016-03-28 ENCOUNTER — Inpatient Hospital Stay (HOSPITAL_COMMUNITY): Payer: Medicare Other

## 2016-03-28 DIAGNOSIS — I371 Nonrheumatic pulmonary valve insufficiency: Secondary | ICD-10-CM

## 2016-03-28 LAB — CBC
HEMATOCRIT: 31.8 % — AB (ref 36.0–46.0)
HEMOGLOBIN: 10.2 g/dL — AB (ref 12.0–15.0)
MCH: 28.7 pg (ref 26.0–34.0)
MCHC: 32.1 g/dL (ref 30.0–36.0)
MCV: 89.6 fL (ref 78.0–100.0)
Platelets: 502 10*3/uL — ABNORMAL HIGH (ref 150–400)
RBC: 3.55 MIL/uL — AB (ref 3.87–5.11)
RDW: 12.7 % (ref 11.5–15.5)
WBC: 12 10*3/uL — AB (ref 4.0–10.5)

## 2016-03-28 LAB — COMPREHENSIVE METABOLIC PANEL
ALBUMIN: 2.6 g/dL — AB (ref 3.5–5.0)
ALT: 15 U/L (ref 14–54)
ANION GAP: 12 (ref 5–15)
AST: 20 U/L (ref 15–41)
Alkaline Phosphatase: 69 U/L (ref 38–126)
BILIRUBIN TOTAL: 0.1 mg/dL — AB (ref 0.3–1.2)
BUN: 10 mg/dL (ref 6–20)
CO2: 33 mmol/L — ABNORMAL HIGH (ref 22–32)
Calcium: 8.2 mg/dL — ABNORMAL LOW (ref 8.9–10.3)
Chloride: 85 mmol/L — ABNORMAL LOW (ref 101–111)
Creatinine, Ser: 0.55 mg/dL (ref 0.44–1.00)
Glucose, Bld: 89 mg/dL (ref 65–99)
POTASSIUM: 3.8 mmol/L (ref 3.5–5.1)
Sodium: 130 mmol/L — ABNORMAL LOW (ref 135–145)
TOTAL PROTEIN: 5.4 g/dL — AB (ref 6.5–8.1)

## 2016-03-28 LAB — GLUCOSE, CAPILLARY
GLUCOSE-CAPILLARY: 124 mg/dL — AB (ref 65–99)
Glucose-Capillary: 79 mg/dL (ref 65–99)
Glucose-Capillary: 207 mg/dL — ABNORMAL HIGH (ref 65–99)

## 2016-03-28 LAB — ECHOCARDIOGRAM COMPLETE
Height: 63 in
WEIGHTICAEL: 1329.6 [oz_av]

## 2016-03-28 MED ORDER — AMOXICILLIN 500 MG PO CAPS: 500.0000 mg | ORAL_CAPSULE | Freq: Three times a day (TID) | ORAL | 0 refills | Status: AC

## 2016-03-28 MED ORDER — HYDROCODONE-ACETAMINOPHEN 5-325 MG PO TABS
1.0000 | ORAL_TABLET | Freq: Four times a day (QID) | ORAL | Status: DC | PRN
  Administered 2016-03-28: 2 via ORAL
  Filled 2016-03-28: qty 2

## 2016-03-28 MED ORDER — AZITHROMYCIN 250 MG PO TABS: 250.0000 mg | ORAL_TABLET | ORAL | 0 refills | Status: AC

## 2016-03-28 NOTE — Progress Notes (Signed)
  Echocardiogram 2D Echocardiogram has been performed.  Tamara Adams 03/28/2016, 11:56 AM

## 2016-03-28 NOTE — Care Management Important Message (Signed)
Important Message  Patient Details  Name: Tamara Adams MRN: [id number] Date of Birth: 06-28-1941   Medicare Important Message Given:  Yes    Nelsy Madonna 03/28/2016, 10:02 AM

## 2016-03-28 NOTE — Discharge Instructions (Addendum)
Tamara Adams, your treated for a pneumonia. Please continue antibiotics as prescribed. If symptoms worsen, please return for reevaluation. While being evaluated here, you had x-ray which was significant for an 8 mm lung nodule. I suggest that you follow up with this in the next 2-3 months with a repeat CT scan, which her primary care physician can arrange.

## 2016-03-28 NOTE — Progress Notes (Signed)
Tamara Adams to be D/C'd Home per MD order.  Discussed prescriptions and follow up appointments with the patient. Prescriptions given to patient, medication list explained in detail. Pt verbalized understanding.    Medication List    STOP taking these medications   acetaminophen 500 MG tablet Commonly known as:  TYLENOL     TAKE these medications   amLODipine 5 MG tablet Commonly known as:  NORVASC Take 1 tablet (5 mg total) by mouth daily.   amoxicillin 500 MG capsule Commonly known as:  AMOXIL Take 1 capsule (500 mg total) by mouth 3 (three) times daily.   aspirin EC 81 MG tablet Take 81 mg by mouth daily.   azithromycin 250 MG tablet Commonly known as:  ZITHROMAX Take 1 tablet (250 mg total) by mouth daily.   dicyclomine 20 MG tablet Commonly known as:  BENTYL Take 1 tablet (20 mg total) by mouth 4 (four) times daily -  before meals and at bedtime.   ENSURE PLUS Liqd Take 237 mLs by mouth See admin instructions. Two to three times a day to cause weight GAIN   fluticasone furoate-vilanterol 100-25 MCG/INH Aepb Commonly known as:  BREO ELLIPTA USE 1 INHALATION DAILY What changed:  how much to take  how to take this  when to take this  additional instructions   HYDROcodone-acetaminophen 10-325 MG tablet Commonly known as:  NORCO Take 1 tablet by mouth every 6 (six) hours as needed. What changed:  reasons to take this   KLOR-CON M10 10 MEQ tablet Generic drug:  potassium chloride Take 1 tablet (10 mEq total) by mouth daily.   linaclotide 290 MCG Caps capsule Commonly known as:  LINZESS Take 1 capsule (290 mcg total) by mouth daily.   LORazepam 1 MG tablet Commonly known as:  ATIVAN Take 1 tablet (1 mg total) by mouth daily as needed for anxiety or sleep. What changed:  when to take this  additional instructions   losartan 100 MG tablet Commonly known as:  COZAAR Take 1 tablet (100 mg total) by mouth daily.   magnesium oxide 400 (241.3 Mg) MG  tablet Commonly known as:  MAG-OX Take 1 tablet (400 mg total) by mouth daily.   metoprolol succinate 50 MG 24 hr tablet Commonly known as:  TOPROL-XL Take 1 tablet (50 mg total) by mouth daily.   multivitamin with minerals Tabs tablet Take 1 tablet by mouth daily. Pt uses One-A-Day brand   nicotine 10 MG inhaler Commonly known as:  NICOTROL Inhale 10 mg into the lungs 2 (two) times daily as needed for smoking cessation.   omeprazole 40 MG capsule Commonly known as:  PRILOSEC TAKE 1 CAPSULE DAILY FOR GASTROINTESTINAL ULCER   ondansetron 8 MG disintegrating tablet Commonly known as:  ZOFRAN-ODT TAKE 1 TABLET EVERY 8 HOURS AS NEEDED FOR NAUSEA What changed:  how much to take  how to take this  when to take this  reasons to take this  additional instructions   OXYGEN Inhale 4 L into the lungs continuous.   PARoxetine 20 MG tablet Commonly known as:  PAXIL Take 1 tablet (20 mg total) by mouth daily.   PROAIR HFA 108 (90 Base) MCG/ACT inhaler Generic drug:  albuterol USE 2 TO 4 INHALATIONS EVERY 4 TO 6 HOURS AS NEEDED What changed:  See the new instructions.   albuterol (2.5 MG/3ML) 0.083% nebulizer solution Commonly known as:  PROVENTIL USE 1 VIAL (3 ML) VIA NEBULIZER EVERY 6 HOURS AS NEEDED FOR WHEEZING OR SHORTNESS  OF BREATH What changed:  See the new instructions.   promethazine 25 MG tablet Commonly known as:  PHENERGAN Take 12.5 mg by mouth every 8 (eight) hours as needed for nausea.   traMADol 50 MG tablet Commonly known as:  ULTRAM Take 1 tablet (50 mg total) by mouth every 6 (six) hours as needed (pain).   umeclidinium bromide 62.5 MCG/INH Aepb Commonly known as:  INCRUSE ELLIPTA Inhale 1 Inhaler into the lungs daily. What changed:  when to take this       Vitals:   03/28/16 0440 03/28/16 0834  BP: (!) 148/71 (!) 172/85  Pulse: 76 93  Resp: 16 18  Temp: 98.7 F (37.1 C) 97.9 F (36.6 C)    Skin clean, dry and intact without evidence of  skin break down, no evidence of skin tears noted. IV catheter discontinued intact. Site without signs and symptoms of complications. Dressing and pressure applied. Pt denies pain at this time. No complaints noted.  An After Visit Summary was printed and given to the patient. Patient escorted via [place], and D/C home via private auto.  Retta Mac BSN, RN

## 2016-03-28 NOTE — Progress Notes (Signed)
Initial Nutrition Assessment  DOCUMENTATION CODES:   Underweight  INTERVENTION:  Continue Ensure Enlive po BID, each supplement provides 350 kcal and 20 grams of protein.  Encourage adequate PO intake.   NUTRITION DIAGNOSIS:   Increased nutrient needs related to chronic illness as evidenced by estimated needs.  GOAL:   Patient will meet greater than or equal to 90% of their needs  MONITOR:   PO intake, Supplement acceptance, Labs, Weight trends, Skin, I & O's  REASON FOR ASSESSMENT:    (Low BMI)    ASSESSMENT:   75 y.o.femalewith medical history significant of COPD on 4 L of oxygen, HTN, HLD, GERD, chronic abdominal pain, nausea, and depression;who presents with complaints of progressively worsening cough and shortness of breath for the last 1- 1/2weeks. Admitted for COPD exacerbation secondary to pneumonia. On 4 L of oxygen at baseline  Pt was unavailable for procedure during time of visit. RD unable to obtain nutrition history. Meal completion has been varied from 5-75%. Pt currently has Ensure ordered and has been consuming them. RD to continue with current orders. Weight has been stable per weight records. Unable to complete Nutrition-Focused physical exam at this time. RD to perform physical exam at next visit.   Labs and medications reviewed.   Diet Order:  Diet Heart Room service appropriate? Yes; Fluid consistency: Thin; Fluid restriction: 1200 mL Fluid  Skin:  Reviewed, no issues  Last BM:  9/5  Height:   Ht Readings from Last 1 Encounters:  03/25/16 '5\' 3"'$  (1.6 m)    Weight:   Wt Readings from Last 1 Encounters:  03/27/16 83 lb 1.6 oz (37.7 kg)    Ideal Body Weight:  52.27 kg  BMI:  Body mass index is 14.72 kg/m.  Estimated Nutritional Needs:   Kcal:  1300-1500  Protein:  60-70 grams  Fluid:  1.2 L/day  EDUCATION NEEDS:   No education needs identified at this time  Corrin Parker, MS, RD, LDN Pager # [phone] After hours/ weekend  pager # [phone]

## 2016-03-28 NOTE — Progress Notes (Signed)
Occupational Therapy Treatment Patient Details Name: Tamara Adams MRN: [id number] DOB: 01/10/1941 Today's Date: 03/28/2016    History of present illness Patient is a 75 yo female admitted 03/24/16 with cough and SOB.  Patient with COPD exacerbation and pna.    PMH:  COPD with home O2 at 4 L/min, DM, HTN, HLD, chronic abdominal pain, nausea, depression, anxiety   OT comments  Focus of session on bathing and dressing seated at sink. Educated pt on energy conservation strategies and pursed lip breathing. Pt given EC handout. Cues needed to pace during activity. Pt much safer with use of RW.  Follow Up Recommendations  Home health OT;Supervision/Assistance - 24 hour    Equipment Recommendations  None recommended by OT    Recommendations for Other Services      Precautions / Restrictions Precautions Precautions: Fall Precaution Comments: On O2 at 4 L/min Restrictions Weight Bearing Restrictions: No       Mobility Bed Mobility Overal bed mobility: Modified Independent             General bed mobility comments: HOB up  Transfers Overall transfer level: Needs assistance Equipment used: Rolling walker (2 wheeled) Transfers: Sit to/from Stand Sit to Stand: Supervision;Modified independent (Device/Increase time)         General transfer comment: supervision from bed, mod I from chair at sink    Balance             Standing balance-Leahy Scale: Fair                     ADL Overall ADL's : Needs assistance/impaired Eating/Feeding: Set up;Sitting Eating/Feeding Details (indicate cue type and reason): pt with tremor, made lid with hole for coffee to prevent spillage Grooming: Wash/dry hands;Wash/dry face;Oral care;Brushing hair;Sitting;Set up   Upper Body Bathing: Set up;Sitting   Lower Body Bathing: Set up;Sit to/from stand   Upper Body Dressing : Set up;Sitting   Lower Body Dressing: Set up;Sitting/lateral leans   Toilet Transfer: Min  guard;Ambulation;Regular Toilet;RW   Toileting- Clothing Manipulation and Hygiene: Sit to/from stand;Supervision/safety       Functional mobility during ADLs: Min guard;Rolling walker General ADL Comments: Pt educated in energy conservation with handout provided to reinforce. Pt cued to slow pace and use breathing techniques during ADL. Sat in chair at sink.      Vision                     Perception     Praxis      Cognition   Behavior During Therapy: WFL for tasks assessed/performed Overall Cognitive Status: Within Functional Limits for tasks assessed                       Extremity/Trunk Assessment               Exercises     Shoulder Instructions       General Comments      Pertinent Vitals/ Pain       Pain Assessment: No/denies pain  Home Living                                          Prior Functioning/Environment              Frequency Min 2X/week     Progress Toward Goals  OT Goals(current goals can now  be found in the care plan section)  Progress towards OT goals: Progressing toward goals  Acute Rehab OT Goals Patient Stated Goal: To go home Time For Goal Achievement: 04/10/16 Potential to Achieve Goals: Good  Plan Discharge plan remains appropriate    Co-evaluation                 End of Session Equipment Utilized During Treatment: Oxygen;Rolling walker   Activity Tolerance Patient tolerated treatment well   Patient Left in bed;with call bell/phone within reach   Nurse Communication          Time: 9485-4627 OT Time Calculation (min): 56 min  Charges: OT General Charges $OT Visit: 1 Procedure OT Treatments $Self Care/Home Management : 38-52 mins  Malka So 03/28/2016, 10:17 AM  [phone]

## 2016-03-28 NOTE — Discharge Summary (Addendum)
Physician Discharge Summary  Tamara Adams NTI:[id number] DOB: 07-03-1941 DOA: 03/24/2016  PCP: Beatrice Lecher, MD  Admit date: 03/24/2016 Discharge date: 03/28/2016  Admitted From: Home  Disposition:  Home   Recommendations for Outpatient Follow-up:  1. Follow up with PCP in 1-2 weeks  Home Health: Yes Equipment/Devices: Oxygen 4 L  Discharge Condition: Stable CODE STATUS: DO NOT RESUSCITATE Diet recommendation: Heart Healthy / Carb Modified  Brief/Interim Summary: 75 y.o.femalewith medical history significant of COPD on 4 L of oxygen, HTN, HLD, GERD, chronic abdominal pain, nausea, and depression;who presents with complaints of progressively worsening cough and shortness of breath for the last 1- 1/2weeks. Admitted for COPD exacerbation secondary to pneumonia. On 4 L of oxygen at baseline. Patient was treated with ceftriaxone and azithromycin. She was afebrile while on this regimen and was transitioned to amoxicillin and continued azithromycin by mouth or discharge. For COPD exacerbation, she was started on prednisone and scheduled DuoNeb which greatly improved her exacerbation. She had some mild hyponatremia that improved with normal saline.   Discharge Diagnoses:  Principal Problem:   Sepsis ([place]) Active Problems:   HYPERTENSION, BENIGN   Hypokalemia   Acute exacerbation of chronic obstructive pulmonary disease (COPD) ([place])   CAP (community acquired pneumonia)   Constipation   SOB (shortness of breath)    Discharge Instructions  Discharge Instructions    Increase activity slowly    Complete by:  As directed       Medication List    STOP taking these medications   acetaminophen 500 MG tablet Commonly known as:  TYLENOL     TAKE these medications   amLODipine 5 MG tablet Commonly known as:  NORVASC Take 1 tablet (5 mg total) by mouth daily.   amoxicillin 500 MG capsule Commonly known as:  AMOXIL Take 1 capsule (500 mg total) by mouth 3 (three) times  daily.   aspirin EC 81 MG tablet Take 81 mg by mouth daily.   azithromycin 250 MG tablet Commonly known as:  ZITHROMAX Take 1 tablet (250 mg total) by mouth daily.   dicyclomine 20 MG tablet Commonly known as:  BENTYL Take 1 tablet (20 mg total) by mouth 4 (four) times daily -  before meals and at bedtime.   ENSURE PLUS Liqd Take 237 mLs by mouth See admin instructions. Two to three times a day to cause weight GAIN   fluticasone furoate-vilanterol 100-25 MCG/INH Aepb Commonly known as:  BREO ELLIPTA USE 1 INHALATION DAILY What changed:  how much to take  how to take this  when to take this  additional instructions   HYDROcodone-acetaminophen 10-325 MG tablet Commonly known as:  NORCO Take 1 tablet by mouth every 6 (six) hours as needed. What changed:  reasons to take this   KLOR-CON M10 10 MEQ tablet Generic drug:  potassium chloride Take 1 tablet (10 mEq total) by mouth daily.   linaclotide 290 MCG Caps capsule Commonly known as:  LINZESS Take 1 capsule (290 mcg total) by mouth daily.   LORazepam 1 MG tablet Commonly known as:  ATIVAN Take 1 tablet (1 mg total) by mouth daily as needed for anxiety or sleep. What changed:  when to take this  additional instructions   losartan 100 MG tablet Commonly known as:  COZAAR Take 1 tablet (100 mg total) by mouth daily.   magnesium oxide 400 (241.3 Mg) MG tablet Commonly known as:  MAG-OX Take 1 tablet (400 mg total) by mouth daily.   metoprolol succinate 50 MG 24  hr tablet Commonly known as:  TOPROL-XL Take 1 tablet (50 mg total) by mouth daily.   multivitamin with minerals Tabs tablet Take 1 tablet by mouth daily. Pt uses One-A-Day brand   nicotine 10 MG inhaler Commonly known as:  NICOTROL Inhale 10 mg into the lungs 2 (two) times daily as needed for smoking cessation.   omeprazole 40 MG capsule Commonly known as:  PRILOSEC TAKE 1 CAPSULE DAILY FOR GASTROINTESTINAL ULCER   ondansetron 8 MG  disintegrating tablet Commonly known as:  ZOFRAN-ODT TAKE 1 TABLET EVERY 8 HOURS AS NEEDED FOR NAUSEA What changed:  how much to take  how to take this  when to take this  reasons to take this  additional instructions   OXYGEN Inhale 4 L into the lungs continuous.   PARoxetine 20 MG tablet Commonly known as:  PAXIL Take 1 tablet (20 mg total) by mouth daily.   PROAIR HFA 108 (90 Base) MCG/ACT inhaler Generic drug:  albuterol USE 2 TO 4 INHALATIONS EVERY 4 TO 6 HOURS AS NEEDED What changed:  See the new instructions.   albuterol (2.5 MG/3ML) 0.083% nebulizer solution Commonly known as:  PROVENTIL USE 1 VIAL (3 ML) VIA NEBULIZER EVERY 6 HOURS AS NEEDED FOR WHEEZING OR SHORTNESS OF BREATH What changed:  See the new instructions.   promethazine 25 MG tablet Commonly known as:  PHENERGAN Take 12.5 mg by mouth every 8 (eight) hours as needed for nausea.   traMADol 50 MG tablet Commonly known as:  ULTRAM Take 1 tablet (50 mg total) by mouth every 6 (six) hours as needed (pain).   umeclidinium bromide 62.5 MCG/INH Aepb Commonly known as:  INCRUSE ELLIPTA Inhale 1 Inhaler into the lungs daily. What changed:  when to take this      Follow-up Information    METHENEY,CATHERINE, MD. Schedule an appointment as soon as possible for a visit today.   Specialty:  Family Medicine Why:  Hospital follow-up. Follow-up on 8 mm lung nodule Contact information: [address] [place] [address] [phone]          Allergies  Allergen Reactions  . Maxidex [Dexamethasone] Other (See Comments)    DEHYDRATION AND HEART RACING  . Vicodin [Hydrocodone-Acetaminophen] Other (See Comments)    Hallucinations  . Advair Diskus [Fluticasone-Salmeterol] Other (See Comments)    No benefit with lungs (INEFFECTIVE)  . Remeron [Mirtazapine] Other (See Comments)    CAUSED NIGHTMARES  . Trazodone And Nefazodone Other (See Comments)    Heart pounding  . Tylox  [Oxycodone-Acetaminophen] Itching    Consultations:  None    Procedures/Studies: Dg Chest 2 View  Result Date: 03/28/2016 CLINICAL DATA:  Chronic shortness of breath, history of COPD, hypertension, sepsis. Former smoker. EXAM: CHEST  2 VIEW COMPARISON:  Chest x-ray of March 24, 2016 and chest CT scan of March 25, 2016. FINDINGS: The lungs remain markedly hyperinflated with hemidiaphragm flattening and increased AP dimension of the thorax. The lower lobe interstitial markings are less conspicuous today. Chronic fibrotic changes persist however. There is no pleural effusion or pneumothorax. The heart and pulmonary vascularity are normal. There is calcification within the wall of the aortic arch. There is mild multilevel degenerative disc disease of the thoracic spine. IMPRESSION: COPD. Decreased interstitial infiltrate in the lower lobes consistent with response to therapy. No CHF. Aortic atherosclerosis. Electronically Signed   By: David  Martinique M.D.   On: 03/28/2016 08:00   Dg Chest 2 View  Result Date: 03/24/2016 CLINICAL  DATA:  Acute onset of cough and intermittent fever. Initial encounter. EXAM: CHEST  2 VIEW COMPARISON:  Chest radiograph performed 06/23/2015 FINDINGS: The lungs are hyperexpanded. Peribronchial thickening is noted, with increased interstitial markings, concerning for acute atypical infection superimposed on the patient's COPD. No definite pleural effusion or pneumothorax is seen. The heart is normal in size; the mediastinal contour is within normal limits. No acute osseous abnormalities are seen. IMPRESSION: Peribronchial thickening, with increased interstitial markings, concerning for acute atypical infection superimposed on the patient's COPD. Electronically Signed   By: Garald Balding M.D.   On: 03/24/2016 21:49   Ct Angio Chest Pe W Or Wo Contrast  Result Date: 03/25/2016 CLINICAL DATA:  Worsening cough, shortness breath, and hypoxia for past 10 days. COPD. EXAM: CT  ANGIOGRAPHY CHEST WITH CONTRAST TECHNIQUE: Multidetector CT imaging of the chest was performed using the standard protocol during bolus administration of intravenous contrast. Multiplanar CT image reconstructions and MIPs were obtained to evaluate the vascular anatomy. CONTRAST:  100 mL Isovue 370 COMPARISON:  01/11/2015 FINDINGS: Cardiovascular: Satisfactory opacification of pulmonary arteries noted, and no pulmonary emboli identified. No evidence of thoracic aortic dissection or aneurysm. Aortic atherosclerosis. Normal heart size. No evidence pericardial effusion . Mediastinum/Lymph Nodes: No masses or pathologically enlarged lymph nodes identified. Lungs/Pleura: Severe centrilobular emphysema again noted. New airspace disease is seen in both lower lobes, right greater than left, suspicious for pneumonia. Cluster of new solid-appearing nodular densities are seen in the inferior aspect of right middle lobe, largest measuring 8 mm on image 123/8. These are likely infectious or inflammatory in etiology, with neoplasm considered less likely. No evidence of pleural effusion. Upper abdomen: No acute findings. Musculoskeletal: No chest wall mass or suspicious bone lesions identified. Review of the MIP images confirms the above findings. IMPRESSION: No evidence of pulmonary embolism. New bilateral lower lobe airspace disease, right side greater than left, suspicious for pneumonia. Clustered sub-cm nodular densities seen in inferior right middle lobe, largest measuring 8 mm, also likely infectious or inflammatory in etiology. Recommend follow-up chest CT in 2-3 months to confirm resolution. Severe centrilobular emphysema. Aortic atherosclerosis. Electronically Signed   By: Earle Gell M.D.   On: 03/25/2016 13:32   Dg Abd Portable 1v  Result Date: 03/25/2016 CLINICAL DATA:  Abdominal pain and constipation. EXAM: PORTABLE ABDOMEN - 1 VIEW COMPARISON:  12/05/2015 FINDINGS: Moderate stool in the colon without evidence of  bowel obstruction or significant ileus. No free air. No abnormal calcifications. Clips present from prior cholecystectomy. Spondylosis and scoliosis of the lumbar spine present. IMPRESSION: Moderate fecal material.  No evidence of bowel obstruction. Electronically Signed   By: Aletta Edouard M.D.   On: 03/25/2016 08:13    Echocardiogram (03/28/16) Study Conclusions  - Left ventricle: The cavity size was normal. Systolic function was   normal. The estimated ejection fraction was in the range of 55%   to 60%. Wall motion was normal; there were no regional wall   motion abnormalities. There was an increased relative   contribution of atrial contraction to ventricular filling.   Doppler parameters are consistent with abnormal left ventricular   relaxation (grade 1 diastolic dysfunction). - Aortic valve: Trileaflet; normal thickness, mildly calcified   leaflets. - Mitral valve: Calcified annulus. - Atrial septum: There was increased thickness of the septum,   consistent with lipomatous hypertrophy.   Subjective: Patient states she is feeling better. She is on home O2. Feels good to go home.  Discharge Exam: Vitals:   03/28/16 0440  03/28/16 0834  BP: (!) 148/71 (!) 172/85  Pulse: 76 93  Resp: 16 18  Temp: 98.7 F (37.1 C) 97.9 F (36.6 C)   Vitals:   03/27/16 2107 03/28/16 0440 03/28/16 0834 03/28/16 0857  BP:  (!) 148/71 (!) 172/85   Pulse:  76 93   Resp:  16 18   Temp:  98.7 F (37.1 C) 97.9 F (36.6 C)   TempSrc:  Oral Oral   SpO2: 97% 97% 96% 96%  Weight:      Height:        General: Pt is alert, awake, not in acute distress Cardiovascular: RRR, S1/S2 +, no rubs, no gallops Respiratory: CTA bilaterally, no wheezing, no rhonchi Abdominal: Soft, NT, ND, bowel sounds + Extremities: no edema, no cyanosis    The results of significant diagnostics from this hospitalization (including imaging, microbiology, ancillary and laboratory) are listed below for reference.      Microbiology: Recent Results (from the past 240 hour(s))  Blood culture (routine x 2)     Status: None (Preliminary result)   Collection Time: 03/24/16 10:05 PM  Result Value Ref Range Status   Specimen Description BLOOD LEFT FOREARM  Final   Special Requests   Final    BOTTLES DRAWN AEROBIC AND ANAEROBIC AER 2.5cc ANA 2.5cc   Culture   Final    NO GROWTH 2 DAYS Performed at [facility]    Report Status PENDING  Incomplete  Blood culture (routine x 2)     Status: None (Preliminary result)   Collection Time: 03/24/16 10:05 PM  Result Value Ref Range Status   Specimen Description BLOOD RIGHT ANTECUBITAL  Final   Special Requests   Final    BOTTLES DRAWN AEROBIC AND ANAEROBIC AER 5cC ANA 5cc   Culture   Final    NO GROWTH 2 DAYS Performed at [facility]    Report Status PENDING  Incomplete  Culture, blood (routine x 2) Call MD if unable to obtain prior to antibiotics being given     Status: None (Preliminary result)   Collection Time: 03/25/16  3:40 AM  Result Value Ref Range Status   Specimen Description BLOOD LEFT ANTECUBITAL  Final   Special Requests BOTTLES DRAWN AEROBIC AND ANAEROBIC 5CC EA  Final   Culture NO GROWTH 2 DAYS  Final   Report Status PENDING  Incomplete  Culture, blood (routine x 2) Call MD if unable to obtain prior to antibiotics being given     Status: None (Preliminary result)   Collection Time: 03/25/16  3:45 AM  Result Value Ref Range Status   Specimen Description BLOOD LEFT ARM  Final   Special Requests IN PEDIATRIC BOTTLE 3CC  Final   Culture NO GROWTH 2 DAYS  Final   Report Status PENDING  Incomplete     Labs: BNP (last 3 results)  Recent Labs  03/24/16 2215  BNP 094.7*   Basic Metabolic Panel:  Recent Labs Lab 03/24/16 2215 03/25/16 0403 03/26/16 0309 03/27/16 0542 03/28/16 0431  NA 135 135 132* 129* 130*  K 3.1* 3.5 4.6 4.2 3.8  CL 90* 92* 91* 87* 85*  CO2 35* 34* 33* 36* 33*  GLUCOSE 145* 237* 119* 63* 89   BUN 14 9 22* 15 10  CREATININE 0.70 0.57 0.62 0.46 0.55  CALCIUM 8.6* 8.6* 8.9 9.0 8.2*   Liver Function Tests:  Recent Labs Lab 03/26/16 0309 03/27/16 0542 03/28/16 0431  AST '21 23 20  '$ ALT 12* 14  15  ALKPHOS 90 88 69  BILITOT 0.4 0.1* 0.1*  PROT 5.7* 5.8* 5.4*  ALBUMIN 2.5* 2.7* 2.6*   No results for input(s): LIPASE, AMYLASE in the last 168 hours. No results for input(s): AMMONIA in the last 168 hours. CBC:  Recent Labs Lab 03/24/16 2215 03/25/16 0403 03/26/16 0309 03/27/16 0542 03/28/16 0431  WBC 12.0* 9.3 23.6* 25.9* 12.0*  HGB 10.9* 10.1* 9.5* 10.1* 10.2*  HCT 33.0* 31.1* 30.0* 31.3* 31.8*  MCV 91.7 90.7 92.3 89.4 89.6  PLT 339 300 382 462* 502*   Cardiac Enzymes:  Recent Labs Lab 03/24/16 2215 03/25/16 0949 03/25/16 1530 03/26/16 0850 03/26/16 1440  TROPONINI <0.03 <0.03 0.35* 0.06* 0.18*   BNP: Invalid input(s): POCBNP CBG:  Recent Labs Lab 03/27/16 1146 03/27/16 1618 03/27/16 2038 03/28/16 0750 03/28/16 1200  GLUCAP 82 170* 85 79 124*   D-Dimer No results for input(s): DDIMER in the last 72 hours. Hgb A1c No results for input(s): HGBA1C in the last 72 hours. Lipid Profile No results for input(s): CHOL, HDL, LDLCALC, TRIG, CHOLHDL, LDLDIRECT in the last 72 hours. Thyroid function studies  Recent Labs  03/27/16 1014  TSH 0.714   Anemia work up No results for input(s): VITAMINB12, FOLATE, FERRITIN, TIBC, IRON, RETICCTPCT in the last 72 hours. Urinalysis    Component Value Date/Time   COLORURINE YELLOW 12/02/2015 [place] 12/02/2015 1328   LABSPEC 1.012 12/02/2015 1328   PHURINE 7.0 12/02/2015 1328   GLUCOSEU NEGATIVE 12/02/2015 1328   HGBUR MODERATE (A) 12/02/2015 1328   BILIRUBINUR NEGATIVE 12/02/2015 1328   BILIRUBINUR NEG 02/12/2014 1207   KETONESUR 15 (A) 12/02/2015 1328   PROTEINUR 100 (A) 12/02/2015 1328   UROBILINOGEN 0.2 03/06/2015 2133   NITRITE NEGATIVE 12/02/2015 1328   LEUKOCYTESUR NEGATIVE  12/02/2015 1328   Sepsis Labs Invalid input(s): PROCALCITONIN,  WBC,  LACTICIDVEN Microbiology Recent Results (from the past 240 hour(s))  Blood culture (routine x 2)     Status: None (Preliminary result)   Collection Time: 03/24/16 10:05 PM  Result Value Ref Range Status   Specimen Description BLOOD LEFT FOREARM  Final   Special Requests   Final    BOTTLES DRAWN AEROBIC AND ANAEROBIC AER 2.5cc ANA 2.5cc   Culture   Final    NO GROWTH 2 DAYS Performed at [facility]    Report Status PENDING  Incomplete  Blood culture (routine x 2)     Status: None (Preliminary result)   Collection Time: 03/24/16 10:05 PM  Result Value Ref Range Status   Specimen Description BLOOD RIGHT ANTECUBITAL  Final   Special Requests   Final    BOTTLES DRAWN AEROBIC AND ANAEROBIC AER 5cC ANA 5cc   Culture   Final    NO GROWTH 2 DAYS Performed at [facility]    Report Status PENDING  Incomplete  Culture, blood (routine x 2) Call MD if unable to obtain prior to antibiotics being given     Status: None (Preliminary result)   Collection Time: 03/25/16  3:40 AM  Result Value Ref Range Status   Specimen Description BLOOD LEFT ANTECUBITAL  Final   Special Requests BOTTLES DRAWN AEROBIC AND ANAEROBIC 5CC EA  Final   Culture NO GROWTH 2 DAYS  Final   Report Status PENDING  Incomplete  Culture, blood (routine x 2) Call MD if unable to obtain prior to antibiotics being given     Status: None (Preliminary result)   Collection Time: 03/25/16  3:45  AM  Result Value Ref Range Status   Specimen Description BLOOD LEFT ARM  Final   Special Requests IN PEDIATRIC BOTTLE 3CC  Final   Culture NO GROWTH 2 DAYS  Final   Report Status PENDING  Incomplete     Time coordinating discharge: Over 30 minutes  SIGNED:   Cordelia Poche, MD  [facility] 03/28/2016, 3:37 PM Pager [phone]  If 7PM-7AM, please contact night-coverage [email or web address] Password [email or web address]

## 2016-03-30 ENCOUNTER — Encounter (HOSPITAL_BASED_OUTPATIENT_CLINIC_OR_DEPARTMENT_OTHER): Payer: Self-pay | Admitting: Emergency Medicine

## 2016-03-30 ENCOUNTER — Inpatient Hospital Stay (HOSPITAL_BASED_OUTPATIENT_CLINIC_OR_DEPARTMENT_OTHER)
Admission: EM | Admit: 2016-03-30 | Discharge: 2016-04-03 | DRG: 190 | Disposition: A | Discharge status: Home Health | Payer: Medicare Other | Attending: Internal Medicine | Admitting: Internal Medicine

## 2016-03-30 ENCOUNTER — Emergency Department (HOSPITAL_BASED_OUTPATIENT_CLINIC_OR_DEPARTMENT_OTHER): Payer: Medicare Other

## 2016-03-30 DIAGNOSIS — J189 Pneumonia, unspecified organism: Secondary | ICD-10-CM | POA: Diagnosis not present

## 2016-03-30 DIAGNOSIS — Z7982 Long term (current) use of aspirin: Secondary | ICD-10-CM | POA: Diagnosis not present

## 2016-03-30 DIAGNOSIS — M19041 Primary osteoarthritis, right hand: Secondary | ICD-10-CM | POA: Diagnosis present

## 2016-03-30 DIAGNOSIS — E43 Unspecified severe protein-calorie malnutrition: Secondary | ICD-10-CM | POA: Diagnosis present

## 2016-03-30 DIAGNOSIS — Y95 Nosocomial condition: Secondary | ICD-10-CM | POA: Diagnosis present

## 2016-03-30 DIAGNOSIS — Z87891 Personal history of nicotine dependence: Secondary | ICD-10-CM

## 2016-03-30 DIAGNOSIS — J441 Chronic obstructive pulmonary disease with (acute) exacerbation: Secondary | ICD-10-CM | POA: Diagnosis not present

## 2016-03-30 DIAGNOSIS — R0602 Shortness of breath: Secondary | ICD-10-CM

## 2016-03-30 DIAGNOSIS — E871 Hypo-osmolality and hyponatremia: Secondary | ICD-10-CM | POA: Diagnosis not present

## 2016-03-30 DIAGNOSIS — Z90710 Acquired absence of both cervix and uterus: Secondary | ICD-10-CM

## 2016-03-30 DIAGNOSIS — I10 Essential (primary) hypertension: Secondary | ICD-10-CM | POA: Diagnosis not present

## 2016-03-30 DIAGNOSIS — K219 Gastro-esophageal reflux disease without esophagitis: Secondary | ICD-10-CM | POA: Diagnosis present

## 2016-03-30 DIAGNOSIS — Z888 Allergy status to other drugs, medicaments and biological substances status: Secondary | ICD-10-CM

## 2016-03-30 DIAGNOSIS — Z9981 Dependence on supplemental oxygen: Secondary | ICD-10-CM | POA: Diagnosis not present

## 2016-03-30 DIAGNOSIS — Z9842 Cataract extraction status, left eye: Secondary | ICD-10-CM

## 2016-03-30 DIAGNOSIS — F329 Major depressive disorder, single episode, unspecified: Secondary | ICD-10-CM | POA: Diagnosis present

## 2016-03-30 DIAGNOSIS — R109 Unspecified abdominal pain: Secondary | ICD-10-CM | POA: Diagnosis present

## 2016-03-30 DIAGNOSIS — J9621 Acute and chronic respiratory failure with hypoxia: Secondary | ICD-10-CM | POA: Diagnosis present

## 2016-03-30 DIAGNOSIS — Z681 Body mass index (BMI) 19 or less, adult: Secondary | ICD-10-CM | POA: Diagnosis not present

## 2016-03-30 DIAGNOSIS — Z961 Presence of intraocular lens: Secondary | ICD-10-CM | POA: Diagnosis present

## 2016-03-30 DIAGNOSIS — Z9841 Cataract extraction status, right eye: Secondary | ICD-10-CM | POA: Diagnosis not present

## 2016-03-30 DIAGNOSIS — Z66 Do not resuscitate: Secondary | ICD-10-CM | POA: Diagnosis present

## 2016-03-30 DIAGNOSIS — M19042 Primary osteoarthritis, left hand: Secondary | ICD-10-CM | POA: Diagnosis present

## 2016-03-30 DIAGNOSIS — Z79899 Other long term (current) drug therapy: Secondary | ICD-10-CM | POA: Diagnosis not present

## 2016-03-30 DIAGNOSIS — G8929 Other chronic pain: Secondary | ICD-10-CM | POA: Diagnosis present

## 2016-03-30 DIAGNOSIS — J440 Chronic obstructive pulmonary disease with acute lower respiratory infection: Secondary | ICD-10-CM | POA: Diagnosis not present

## 2016-03-30 DIAGNOSIS — J9611 Chronic respiratory failure with hypoxia: Secondary | ICD-10-CM | POA: Diagnosis present

## 2016-03-30 DIAGNOSIS — R05 Cough: Secondary | ICD-10-CM | POA: Diagnosis not present

## 2016-03-30 DIAGNOSIS — Z885 Allergy status to narcotic agent status: Secondary | ICD-10-CM | POA: Diagnosis not present

## 2016-03-30 DIAGNOSIS — R5381 Other malaise: Secondary | ICD-10-CM

## 2016-03-30 DIAGNOSIS — F419 Anxiety disorder, unspecified: Secondary | ICD-10-CM | POA: Diagnosis present

## 2016-03-30 DIAGNOSIS — E860 Dehydration: Secondary | ICD-10-CM | POA: Diagnosis present

## 2016-03-30 LAB — CBC WITH DIFFERENTIAL/PLATELET
Band Neutrophils: 0 %
Basophils Absolute: 0 10*3/uL (ref 0.0–0.1)
Basophils Relative: 0 %
Blasts: 0 %
Eosinophils Absolute: 0.1 10*3/uL (ref 0.0–0.7)
Eosinophils Relative: 1 %
HCT: 36.6 % (ref 36.0–46.0)
Hemoglobin: 12.5 g/dL (ref 12.0–15.0)
Lymphocytes Relative: 12 %
Lymphs Abs: 1.6 10*3/uL (ref 0.7–4.0)
MCH: 29.6 pg (ref 26.0–34.0)
MCHC: 34.2 g/dL (ref 30.0–36.0)
MCV: 86.5 fL (ref 78.0–100.0)
Metamyelocytes Relative: 0 %
Monocytes Absolute: 1.2 10*3/uL — ABNORMAL HIGH (ref 0.1–1.0)
Monocytes Relative: 9 %
Myelocytes: 0 %
Neutro Abs: 10.4 10*3/uL — ABNORMAL HIGH (ref 1.7–7.7)
Neutrophils Relative %: 78 %
Platelets: 800 10*3/uL — ABNORMAL HIGH (ref 150–400)
Promyelocytes Absolute: 0 %
RBC: 4.23 MIL/uL (ref 3.87–5.11)
RDW: 12.1 % (ref 11.5–15.5)
WBC: 13.3 10*3/uL — ABNORMAL HIGH (ref 4.0–10.5)
nRBC: 0 /100 WBC

## 2016-03-30 LAB — URINALYSIS, ROUTINE W REFLEX MICROSCOPIC
Bilirubin Urine: NEGATIVE
Glucose, UA: NEGATIVE mg/dL
Ketones, ur: 15 mg/dL — AB
LEUKOCYTES UA: NEGATIVE
Nitrite: NEGATIVE
PROTEIN: 100 mg/dL — AB
Specific Gravity, Urine: 1.019 (ref 1.005–1.030)
pH: 7 (ref 5.0–8.0)

## 2016-03-30 LAB — CULTURE, BLOOD (ROUTINE X 2)
CULTURE: NO GROWTH
Culture: NO GROWTH
Culture: NO GROWTH
Culture: NO GROWTH

## 2016-03-30 LAB — OSMOLALITY: Osmolality: 269 mOsm/kg — ABNORMAL LOW (ref 275–295)

## 2016-03-30 LAB — URINE MICROSCOPIC-ADD ON

## 2016-03-30 LAB — BASIC METABOLIC PANEL
Anion gap: 10 (ref 5–15)
BUN: 14 mg/dL (ref 6–20)
CO2: 32 mmol/L (ref 22–32)
Calcium: 8.6 mg/dL — ABNORMAL LOW (ref 8.9–10.3)
Chloride: 87 mmol/L — ABNORMAL LOW (ref 101–111)
Creatinine, Ser: 0.6 mg/dL (ref 0.44–1.00)
GFR calc Af Amer: >60 mL/min (ref >60)
GFR calc non Af Amer: >60 mL/min (ref >60)
Glucose, Bld: 134 mg/dL — ABNORMAL HIGH (ref 65–99)
Potassium: 3.7 mmol/L (ref 3.5–5.1)
Sodium: 129 mmol/L — ABNORMAL LOW (ref 135–145)

## 2016-03-30 LAB — TROPONIN I

## 2016-03-30 LAB — CREATININE, URINE, RANDOM: Creatinine, Urine: 94.25 mg/dL

## 2016-03-30 LAB — BRAIN NATRIURETIC PEPTIDE: B Natriuretic Peptide: 54.3 pg/mL (ref 0.0–100.0)

## 2016-03-30 LAB — OSMOLALITY, URINE: Osmolality, Ur: 487 mOsm/kg (ref 300–900)

## 2016-03-30 LAB — URIC ACID: URIC ACID, SERUM: 2.9 mg/dL (ref 2.3–6.6)

## 2016-03-30 LAB — SODIUM, URINE, RANDOM: SODIUM UR: 46 mmol/L

## 2016-03-30 MED ORDER — VANCOMYCIN HCL IN DEXTROSE 1-5 GM/200ML-% IV SOLN
1000.0000 mg | Freq: Once | INTRAVENOUS | Status: AC
  Administered 2016-03-30: 1000 mg via INTRAVENOUS
  Filled 2016-03-30: qty 200

## 2016-03-30 MED ORDER — ALBUTEROL (5 MG/ML) CONTINUOUS INHALATION SOLN
10.0000 mg/h | INHALATION_SOLUTION | RESPIRATORY_TRACT | Status: AC
  Administered 2016-03-30: 10 mg/h via RESPIRATORY_TRACT
  Filled 2016-03-30: qty 20

## 2016-03-30 MED ORDER — VANCOMYCIN HCL IN DEXTROSE 1-5 GM/200ML-% IV SOLN
1000.0000 mg | INTRAVENOUS | Status: DC
  Filled 2016-03-30: qty 200

## 2016-03-30 MED ORDER — ENOXAPARIN SODIUM 30 MG/0.3ML ~~LOC~~ SOLN: 30.0000 mg | SUBCUTANEOUS | Status: DC

## 2016-03-30 MED ORDER — ASPIRIN EC 81 MG PO TBEC
81.0000 mg | DELAYED_RELEASE_TABLET | Freq: Every day | ORAL | Status: DC
  Administered 2016-03-31 – 2016-04-03 (×4): 81 mg via ORAL
  Filled 2016-03-30 (×4): qty 1

## 2016-03-30 MED ORDER — UMECLIDINIUM BROMIDE 62.5 MCG/INH IN AEPB
1.0000 | INHALATION_SPRAY | Freq: Every morning | RESPIRATORY_TRACT | Status: DC
Start: 2016-03-30 — End: 2016-04-03
  Administered 2016-03-30 – 2016-04-03 (×5): 1 via RESPIRATORY_TRACT
  Filled 2016-03-30 (×2): qty 7

## 2016-03-30 MED ORDER — ENSURE ENLIVE PO LIQD: 237.0000 mL | Freq: Three times a day (TID) | ORAL | Status: DC | PRN

## 2016-03-30 MED ORDER — HYDROCODONE-ACETAMINOPHEN 10-325 MG PO TABS
1.0000 | ORAL_TABLET | Freq: Four times a day (QID) | ORAL | Status: DC | PRN
  Administered 2016-03-30 – 2016-04-02 (×5): 1 via ORAL
  Filled 2016-03-30 (×5): qty 1

## 2016-03-30 MED ORDER — PREDNISONE 50 MG PO TABS: 50.0000 mg | ORAL_TABLET | Freq: Every day | ORAL | Status: DC

## 2016-03-30 MED ORDER — POTASSIUM CHLORIDE CRYS ER 10 MEQ PO TBCR
10.0000 meq | EXTENDED_RELEASE_TABLET | Freq: Every day | ORAL | Status: DC
  Administered 2016-03-30 – 2016-04-03 (×5): 10 meq via ORAL
  Filled 2016-03-30 (×5): qty 1

## 2016-03-30 MED ORDER — NICOTINE 10 MG IN INHA
10.0000 mg | Freq: Two times a day (BID) | RESPIRATORY_TRACT | Status: DC | PRN
  Filled 2016-03-30: qty 33

## 2016-03-30 MED ORDER — PROMETHAZINE HCL 25 MG PO TABS
12.5000 mg | ORAL_TABLET | Freq: Three times a day (TID) | ORAL | Status: DC | PRN
  Administered 2016-04-03: 12.5 mg via ORAL
  Filled 2016-03-30: qty 1

## 2016-03-30 MED ORDER — METOPROLOL SUCCINATE ER 50 MG PO TB24
50.0000 mg | ORAL_TABLET | Freq: Every day | ORAL | Status: DC
  Administered 2016-03-30 – 2016-04-03 (×5): 50 mg via ORAL
  Filled 2016-03-30 (×5): qty 1

## 2016-03-30 MED ORDER — FLUTICASONE FUROATE-VILANTEROL 100-25 MCG/INH IN AEPB
1.0000 | INHALATION_SPRAY | Freq: Every evening | RESPIRATORY_TRACT | Status: DC
  Administered 2016-03-30: 1 via RESPIRATORY_TRACT
  Filled 2016-03-30: qty 28

## 2016-03-30 MED ORDER — METHYLPREDNISOLONE SODIUM SUCC 125 MG IJ SOLR
125.0000 mg | Freq: Once | INTRAMUSCULAR | Status: AC
  Administered 2016-03-30: 125 mg via INTRAVENOUS
  Filled 2016-03-30: qty 2

## 2016-03-30 MED ORDER — DICYCLOMINE HCL 20 MG PO TABS
20.0000 mg | ORAL_TABLET | Freq: Three times a day (TID) | ORAL | Status: DC
  Administered 2016-03-30 – 2016-04-03 (×16): 20 mg via ORAL
  Filled 2016-03-30 (×18): qty 1

## 2016-03-30 MED ORDER — DEXTROSE 5 % IV SOLN
1.0000 g | Freq: Once | INTRAVENOUS | Status: AC
  Administered 2016-03-30: 1 g via INTRAVENOUS

## 2016-03-30 MED ORDER — AMLODIPINE BESYLATE 5 MG PO TABS
5.0000 mg | ORAL_TABLET | Freq: Every day | ORAL | Status: DC
Start: 2016-03-30 — End: 2016-04-03
  Administered 2016-03-30 – 2016-04-03 (×5): 5 mg via ORAL
  Filled 2016-03-30 (×5): qty 1

## 2016-03-30 MED ORDER — PANTOPRAZOLE SODIUM 40 MG PO TBEC
80.0000 mg | DELAYED_RELEASE_TABLET | Freq: Every day | ORAL | Status: DC
  Administered 2016-03-30 – 2016-04-03 (×5): 80 mg via ORAL
  Filled 2016-03-30 (×5): qty 2

## 2016-03-30 MED ORDER — CEFEPIME HCL 1 G IJ SOLR
INTRAMUSCULAR | Status: AC
  Filled 2016-03-30: qty 1

## 2016-03-30 MED ORDER — LINACLOTIDE 145 MCG PO CAPS
290.0000 ug | ORAL_CAPSULE | Freq: Every day | ORAL | Status: DC
  Administered 2016-03-30 – 2016-04-03 (×5): 290 ug via ORAL
  Filled 2016-03-30: qty 1
  Filled 2016-03-30: qty 2
  Filled 2016-03-30: qty 1
  Filled 2016-03-30 (×2): qty 2

## 2016-03-30 MED ORDER — DEXTROSE 5 % IV SOLN: 1.0000 g | Freq: Three times a day (TID) | INTRAVENOUS | Status: DC

## 2016-03-30 MED ORDER — ONDANSETRON 4 MG PO TBDP
8.0000 mg | ORAL_TABLET | Freq: Three times a day (TID) | ORAL | Status: DC | PRN
  Administered 2016-03-30 – 2016-04-03 (×5): 8 mg via ORAL
  Filled 2016-03-30 (×5): qty 2

## 2016-03-30 MED ORDER — ENOXAPARIN SODIUM 40 MG/0.4ML ~~LOC~~ SOLN: 40.0000 mg | SUBCUTANEOUS | Status: DC

## 2016-03-30 MED ORDER — TRAMADOL HCL 50 MG PO TABS: 50.0000 mg | ORAL_TABLET | Freq: Four times a day (QID) | ORAL | Status: DC | PRN

## 2016-03-30 MED ORDER — ALBUTEROL SULFATE (2.5 MG/3ML) 0.083% IN NEBU
2.5000 mg | INHALATION_SOLUTION | Freq: Four times a day (QID) | RESPIRATORY_TRACT | Status: DC | PRN
  Filled 2016-03-30 (×2): qty 3

## 2016-03-30 MED ORDER — IPRATROPIUM BROMIDE 0.02 % IN SOLN
0.5000 mg | RESPIRATORY_TRACT | Status: AC
  Administered 2016-03-30: 0.5 mg via RESPIRATORY_TRACT
  Filled 2016-03-30: qty 2.5

## 2016-03-30 MED ORDER — PAROXETINE HCL 20 MG PO TABS
20.0000 mg | ORAL_TABLET | Freq: Every day | ORAL | Status: DC
  Administered 2016-03-30 – 2016-04-03 (×5): 20 mg via ORAL
  Filled 2016-03-30 (×5): qty 1

## 2016-03-30 MED ORDER — LOSARTAN POTASSIUM 50 MG PO TABS
100.0000 mg | ORAL_TABLET | Freq: Every day | ORAL | Status: DC
  Administered 2016-03-30 – 2016-04-03 (×5): 100 mg via ORAL
  Filled 2016-03-30 (×5): qty 2

## 2016-03-30 MED ORDER — ADULT MULTIVITAMIN W/MINERALS CH
1.0000 | ORAL_TABLET | Freq: Every day | ORAL | Status: DC
  Administered 2016-03-30 – 2016-04-03 (×5): 1 via ORAL
  Filled 2016-03-30 (×5): qty 1

## 2016-03-30 MED ORDER — DEXTROSE 5 % IV SOLN
1.0000 g | INTRAVENOUS | Status: DC
  Administered 2016-03-31 – 2016-04-01 (×2): 1 g via INTRAVENOUS
  Filled 2016-03-30 (×4): qty 1

## 2016-03-30 MED ORDER — ALBUTEROL SULFATE HFA 108 (90 BASE) MCG/ACT IN AERS: 2.0000 | INHALATION_SPRAY | RESPIRATORY_TRACT | Status: DC | PRN

## 2016-03-30 MED ORDER — MAGNESIUM OXIDE 400 (241.3 MG) MG PO TABS
400.0000 mg | ORAL_TABLET | Freq: Every day | ORAL | Status: DC
  Administered 2016-03-30 – 2016-04-03 (×5): 400 mg via ORAL
  Filled 2016-03-30 (×5): qty 1

## 2016-03-30 MED ORDER — LORAZEPAM 1 MG PO TABS
1.0000 mg | ORAL_TABLET | Freq: Every day | ORAL | Status: DC | PRN
  Administered 2016-03-31 – 2016-04-01 (×2): 1 mg via ORAL
  Filled 2016-03-30 (×2): qty 1

## 2016-03-30 NOTE — ED Provider Notes (Signed)
[facility] DEPT [facility] Provider Note   CSN: [id number] Arrival date & time: 03/30/16  1328     History   Chief Complaint Chief Complaint  Patient presents with  . Shortness of Breath    HPI Tamara Adams is a 75 y.o. female.  HPI Patient presents to the emergency department with worsening shortness of breath and cough.  The patient states that she was recently admitted to the hospital with pneumonia and COPD exacerbation.  She states that over the last 2 days.  She has gotten worse and feels more weak.  Patient states that she is unable to maintain her normal oxygen flow.  She had to increase it to 6 L from 4-1/2.  The patient states that when she attempts to do any activities.  She gets worse.  She states she was sent home with 1 dose of Zithromax.  Patient states that she completed that yesterday.  Patient states that nothing seems make the condition better.  She states she did not take any other medications other than the ones prescribed.  Patient, states she has not had any chest pain. The patient denies chest pain, headache,blurred vision, neck pain, fever, , weakness, numbness, dizziness, anorexia, edema, abdominal pain, nausea, vomiting, diarrhea, rash, back pain, dysuria, hematemesis, bloody stool, near syncope, or syncope. Past Medical History:  Diagnosis Date  . Anemia    as a child  . Arthritis    "hands" (12/24/2014)  . Complication of anesthesia    " My blood gas dropped during surgery so I was left on a ventilator and in ICU for 3 days."  . COPD (chronic obstructive pulmonary disease) (HCC)    FVC 72%, FEV1 29%, FEV1 ratio 32% (very severe (COPD)  . COPD (chronic obstructive pulmonary disease) ([place])   . Degenerative disc disease   . Depression with anxiety   . Diverticulosis   . DVT (deep venous thrombosis) ([place])    "LLE; years ago after a surgery"  . Essential hypertension   . Fluttering sensation of heart    pt put on metoprolol as a result  . GERD  (gastroesophageal reflux disease)    PMH  . H/O hiatal hernia   . Headache    "maybe weekly" (12/24/2014)  . Hyperlipidemia   . Hypertension   . Kidney stone   . Liver lesion   . On home oxygen therapy    "3L; sleep w/it; use it when I rest" (12/24/2014)  . Peptic ulcer   . Pinched nerve    in back  . Pneumonia   . PONV (postoperative nausea and vomiting)   . Shortness of breath dyspnea    with exertion  . Skipped heart beats   . Small bowel obstruction ([place])    "several times; OR twice" (12/24/2014)  . Tubular adenoma of colon 09/2011  . Wears glasses     Patient Active Problem List   Diagnosis Date Noted  . SOB (shortness of breath)   . Acute exacerbation of chronic obstructive pulmonary disease (COPD) ([place]) 03/25/2016  . CAP (community acquired pneumonia) 03/25/2016  . Sepsis ([place]) 03/25/2016  . Constipation 03/25/2016  . Barrett's esophagus without dysplasia 12/15/2015  . Ileus ([place]) 12/02/2015  . Hypokalemia 12/02/2015  . Essential hypertension   . GERD (gastroesophageal reflux disease)   . Depression with anxiety   . Generalized abdominal pain   . Leukocytosis 01/12/2015  . HCAP (healthcare-associated pneumonia) 01/11/2015  . COPD exacerbation ([place]) 01/11/2015  . Aortic atherosclerosis ([place]) 10/05/2014  .  Abnormal CT of liver 02/05/2014  . Protein-calorie malnutrition, severe ([place]) 01/29/2014  . Small bowel obstruction ([place]) 01/28/2014  . Chronic respiratory failure ([place]) 12/07/2013  . SBO (small bowel obstruction) ([place]) 08/15/2013  . Lumbar degenerative disc disease 05/28/2013  . Gastric ulcer 04/22/2013  . Other malaise and fatigue 10/09/2012  . CHEST PAIN, PRECORDIAL 10/04/2010  . HLD (hyperlipidemia) 09/14/2010  . TOBACCO ABUSE 09/14/2010  . HYPERTENSION, BENIGN 09/14/2010  . COPD (chronic obstructive pulmonary disease) ([place]) 09/14/2010  . Insomnia 09/14/2010  . PALPITATIONS 09/14/2010  . CAROTID BRUIT 09/14/2010    Past Surgical History:  Procedure  Laterality Date  . ABDOMINAL ADHESION SURGERY  12/24/2014  . ABDOMINAL HYSTERECTOMY  1987   and one ovary  . CATARACT EXTRACTION W/ INTRAOCULAR LENS  IMPLANT, BILATERAL Right 06/22/2012   Dr. Lester Kinsman.   Marland Kitchen CATARACT EXTRACTION W/PHACO Left 06/12/2012   Dr. Boykin Reaper  . CHOLECYSTECTOMY N/A 08/18/2013   Procedure: LAPAROSCOPIC CHOLECYSTECTOMY;  Surgeon: Harl Bowie, MD;  Location: [facility];  Service: General;  Laterality: N/A;  . DILATION AND CURETTAGE OF UTERUS    . EXPLORATORY LAPAROTOMY  12/24/2014  . HERNIA REPAIR    . INCISIONAL HERNIA REPAIR  12/24/2014  . INCISIONAL HERNIA REPAIR N/A 12/24/2014   Procedure: HERNIA REPAIR INCISIONAL;  Surgeon: Coralie Keens, MD;  Location: [facility];  Service: General;  Laterality: N/A;  . LAPAROTOMY N/A 12/24/2014   Procedure: EXPLORATORY LAPAROTOMY;  Surgeon: Coralie Keens, MD;  Location: [facility];  Service: General;  Laterality: N/A;  . LYSIS OF ADHESION N/A 12/24/2014   Procedure: LYSIS OF ADHESION;  Surgeon: Coralie Keens, MD;  Location: [facility];  Service: General;  Laterality: N/A;  . MULTIPLE TOOTH EXTRACTIONS    . OOPHORECTOMY  1992  . SMALL INTESTINE SURGERY     for a blockage, no bowel was removed, just kinked from scar tissue  . TUBAL LIGATION    . URETHRAL DILATION      OB History    No data available       Home Medications    Prior to Admission medications   Medication Sig Start Date End Date Taking? Authorizing Provider  albuterol (PROVENTIL) (2.5 MG/3ML) 0.083% nebulizer solution USE 1 VIAL (3 ML) VIA NEBULIZER EVERY 6 HOURS AS NEEDED FOR WHEEZING OR SHORTNESS OF BREATH Patient taking differently: USE 1 VIAL (3 ML) VIA NEBULIZER THREE TIMES DAILY 10/31/15   [name], MD  amLODipine (NORVASC) 5 MG tablet Take 1 tablet (5 mg total) by mouth daily. 02/21/16   [name], MD  aspirin EC 81 MG tablet Take 81 mg by mouth daily.    Historical Provider, MD  dicyclomine (BENTYL) 20 MG tablet Take 1 tablet (20 mg total)  by mouth 4 (four) times daily -  before meals and at bedtime. 02/22/16   [name], MD  ENSURE PLUS (ENSURE PLUS) LIQD Take 237 mLs by mouth See admin instructions. Two to three times a day to cause weight GAIN    Historical Provider, MD  fluticasone furoate-vilanterol (BREO ELLIPTA) 100-25 MCG/INH AEPB USE 1 INHALATION DAILY Patient taking differently: Inhale 1 puff into the lungs every evening.  01/19/16   [name], MD  HYDROcodone-acetaminophen (NORCO) 10-325 MG tablet Take 1 tablet by mouth every 6 (six) hours as needed. Patient taking differently: Take 1 tablet by mouth every 6 (six) hours as needed (for pain).  02/02/16   [name], MD  KLOR-CON M10 10 MEQ tablet Take 1  tablet (10 mEq total) by mouth daily. 02/01/16   [name], MD  Linaclotide Rolan Lipa) 290 MCG CAPS capsule Take 1 capsule (290 mcg total) by mouth daily. 03/08/15   [name], MD  LORazepam (ATIVAN) 1 MG tablet Take 1 tablet (1 mg total) by mouth daily as needed for anxiety or sleep. Patient taking differently: Take 1 mg by mouth at bedtime. FOR SLEEP 02/22/16 10/04/16  Jade L Breeback, PA-C  losartan (COZAAR) 100 MG tablet Take 1 tablet (100 mg total) by mouth daily. 01/30/16   [name], MD  magnesium oxide (MAG-OX) 400 (241.3 Mg) MG tablet Take 1 tablet (400 mg total) by mouth daily. 12/06/15   Theodis Blaze, MD  metoprolol succinate (TOPROL-XL) 50 MG 24 hr tablet Take 1 tablet (50 mg total) by mouth daily. 01/30/16   [name], MD  Multiple Vitamin (MULTIVITAMIN WITH MINERALS) TABS tablet Take 1 tablet by mouth daily. Pt uses One-A-Day brand    Historical Provider, MD  nicotine (NICOTROL) 10 MG inhaler Inhale 10 mg into the lungs 2 (two) times daily as needed for smoking cessation.  06/23/15   Historical Provider, MD  omeprazole (PRILOSEC) 40 MG capsule TAKE 1 CAPSULE DAILY FOR GASTROINTESTINAL ULCER 02/24/15   [name], MD  ondansetron (ZOFRAN-ODT) 8  MG disintegrating tablet TAKE 1 TABLET EVERY 8 HOURS AS NEEDED FOR NAUSEA Patient taking differently: Take 8 mg by mouth every 8 (eight) hours as needed for nausea. TAKE 1 TABLET EVERY 8 HOURS AS NEEDED FOR NAUSEA 02/22/16   [name], MD  OXYGEN Inhale 4 L into the lungs continuous.    Historical Provider, MD  PARoxetine (PAXIL) 20 MG tablet Take 1 tablet (20 mg total) by mouth daily. 02/21/16   Sean Hommel, DO  PROAIR HFA 108 (90 Base) MCG/ACT inhaler USE 2 TO 4 INHALATIONS EVERY 4 TO 6 HOURS AS NEEDED Patient taking differently: USE 2 TO 4 INHALATIONS EVERY 4 TO 6 HOURS AS NEEDED FOR SHORTNESS OF BREATH/WHEEZING 10/31/15   [name], MD  promethazine (PHENERGAN) 25 MG tablet Take 12.5 mg by mouth every 8 (eight) hours as needed for nausea.  12/26/15   Historical Provider, MD  traMADol (ULTRAM) 50 MG tablet Take 1 tablet (50 mg total) by mouth every 6 (six) hours as needed (pain). 02/22/16   Jade L Breeback, PA-C  Umeclidinium Bromide (INCRUSE ELLIPTA) 62.5 MCG/INH AEPB Inhale 1 Inhaler into the lungs daily. Patient taking differently: Inhale 1 puff into the lungs every morning.  04/25/15   [name], MD    Family History Family History  Problem Relation Age of Onset  . Hypertension Mother   . Ovarian cancer Mother   . Glaucoma Mother   . Glaucoma Sister   . Colon cancer Neg Hx   . Colon polyps Neg Hx   . Diabetes Neg Hx   . Kidney disease Neg Hx   . Gallbladder disease Neg Hx   . Esophageal cancer Neg Hx     Social History Social History  Substance Use Topics  . Smoking status: Former Smoker    Packs/day: 0.25    Years: 57.00    Types: Cigarettes    Quit date: 07/25/2015  . Smokeless tobacco: Never Used     Comment: 5 cigarettes a day  . Alcohol use No     Allergies   Maxidex [dexamethasone]; Vicodin [hydrocodone-acetaminophen]; Advair diskus [fluticasone-salmeterol]; Remeron [mirtazapine]; Trazodone and nefazodone; and Tylox  [oxycodone-acetaminophen]  Review of Systems Review of Systems All other systems negative except as documented in the HPI. All pertinent positives and negatives as reviewed in the HPI.  Physical Exam Updated Vital Signs BP 126/60   Pulse 70   Temp 98.1 F (36.7 C)   Resp 16   Ht '5\' 3"'$  (1.6 m)   Wt 37.6 kg   SpO2 96%   BMI 14.70 kg/m   Physical Exam  Constitutional: She is oriented to person, place, and time. She appears well-developed and well-nourished. No distress.  HENT:  Head: Normocephalic and atraumatic.  Mouth/Throat: Oropharynx is clear and moist.  Eyes: Pupils are equal, round, and reactive to light.  Neck: Normal range of motion. Neck supple.  Cardiovascular: Normal rate, regular rhythm and normal heart sounds.  Exam reveals no gallop and no friction rub.   No murmur heard. Pulmonary/Chest: Effort normal. No respiratory distress. She has no decreased breath sounds. She has wheezes. She has rhonchi. She has no rales.  Abdominal: Soft. Bowel sounds are normal. She exhibits no distension. There is no tenderness.  Neurological: She is alert and oriented to person, place, and time. She exhibits normal muscle tone. Coordination normal.  Skin: Skin is warm and dry. No rash noted. No erythema.  Psychiatric: She has a normal mood and affect. Her behavior is normal.  Nursing note and vitals reviewed.    ED Treatments / Results  Labs (all labs ordered are listed, but only abnormal results are displayed) Labs Reviewed  BASIC METABOLIC PANEL - Abnormal; Notable for the following:       Result Value   Sodium 129 (*)    Chloride 87 (*)    Glucose, Bld 134 (*)    Calcium 8.6 (*)    All other components within normal limits  CBC WITH DIFFERENTIAL/PLATELET - Abnormal; Notable for the following:    WBC 13.3 (*)    Platelets 800 (*)    Neutro Abs 10.4 (*)    Monocytes Absolute 1.2 (*)    All other components within normal limits  URINE CULTURE  BRAIN NATRIURETIC PEPTIDE   TROPONIN I  TROPONIN I  TROPONIN I  OSMOLALITY, URINE  OSMOLALITY  CREATININE, URINE, RANDOM  SODIUM, URINE, RANDOM  URINALYSIS, ROUTINE W REFLEX MICROSCOPIC (NOT AT [facility])  URIC ACID    EKG  EKG Interpretation None       Radiology Dg Chest 2 View  Result Date: 03/30/2016 CLINICAL DATA:  Shortness of breath productive cough. Chronic lung disease. EXAM: CHEST  2 VIEW COMPARISON:  03/28/2016.  03/24/2016. FINDINGS: The heart size is normal. There is aortic atherosclerosis. Lungs are markedly hyperinflated consistent with emphysema. Infiltrate at the right lung base may have worsened slightly. Small amount of pleural fluid in the posterior costophrenic angles. No acute bone finding. IMPRESSION: Chronic emphysema with hyperinflation. Persistent and probably slightly worsened right lung base pneumonia. Small amount of pleural fluid in the posterior costophrenic angles. Electronically Signed   By: Nelson Chimes M.D.   On: 03/30/2016 14:42    Procedures Procedures (including critical care time)  Medications Ordered in ED Medications  albuterol (PROVENTIL,VENTOLIN) solution continuous neb (10 mg/hr Nebulization New Bag/Given 03/30/16 1433)  methylPREDNISolone sodium succinate (SOLU-MEDROL) 125 mg/2 mL injection 125 mg (125 mg Intravenous Given 03/30/16 1412)  ipratropium (ATROVENT) nebulizer solution 0.5 mg (0.5 mg Nebulization Given 03/30/16 1433)     Initial Impression / Assessment and Plan / ED Course  I have reviewed the triage vital signs and the nursing notes.  Pertinent labs & imaging results that were available during my care of the patient were reviewed by me and considered in my medical decision making (see chart for details).  Clinical Course    Patient needed to be admitted to the hospital.  Patient be placed on healthcare associated pneumonia antibiotics.   Final Clinical Impressions(s) / ED Diagnoses   Final diagnoses:  SOB (shortness of breath)  HCAP  (healthcare-associated pneumonia)    New Prescriptions New Prescriptions   No medications on file     Dalia Heading, PA-C 03/30/16 [place], MD 03/30/16 1740

## 2016-03-30 NOTE — Progress Notes (Signed)
Patient trasfered from [facility] to [phone] via [place]; alert and oriented x 4; complaints of pain on gastric area; IV saline locked in LFA running fluids'@KVO'$ .  Orient patient to room and unit; gave patient care guide; instructed how to use the call bell and  fall risk precautions. Will continue to monitor the patient.

## 2016-03-30 NOTE — H&P (Signed)
History and Physical    Tamara Adams NWG:[id number] DOB: Sep 04, 1940 DOA: 03/30/2016   PCP: Beatrice Lecher, MD Chief Complaint:  Chief Complaint  Patient presents with  . Shortness of Breath    HPI: Tamara Adams is a 75 y.o. female with medical history significant of COPD 4L O2 at home at baseline.  Patient was just in the hospital from 9/2-9/6 with CAP and COPD exacerbation.  Despite treatment with rocephin / azithromycin and discharge on amoxicillin and azithromycin as well as prednisone; patients symptoms have become worse at home.  She has increased cough, wheezing, SOB.  Taken no meds other than the ones prescribed.  Had to increase O2 at home to up to 6L.  No fever, chills, N/V/D, rash, dysuria.  Patient returns to ED for worsening symptoms.  ED Course: CXR shows persistent to slightly worse PNA even when compared to CXR on day of discharge (9/6).  Her wheezing is improved with solumedrol and breathing treatments.  Review of Systems: As per HPI otherwise 10 point review of systems negative.    Past Medical History:  Diagnosis Date  . Anemia    as a child  . Arthritis    "hands" (12/24/2014)  . Complication of anesthesia    " My blood gas dropped during surgery so I was left on a ventilator and in ICU for 3 days."  . COPD (chronic obstructive pulmonary disease) (HCC)    FVC 72%, FEV1 29%, FEV1 ratio 32% (very severe (COPD)  . COPD (chronic obstructive pulmonary disease) ([place])   . Degenerative disc disease   . Depression with anxiety   . Diverticulosis   . DVT (deep venous thrombosis) ([place])    "LLE; years ago after a surgery"  . Essential hypertension   . Fluttering sensation of heart    pt put on metoprolol as a result  . GERD (gastroesophageal reflux disease)    PMH  . H/O hiatal hernia   . Headache    "maybe weekly" (12/24/2014)  . Hyperlipidemia   . Hypertension   . Kidney stone   . Liver lesion   . On home oxygen therapy    "3L; sleep w/it; use it when I rest"  (12/24/2014)  . Peptic ulcer   . Pinched nerve    in back  . Pneumonia   . PONV (postoperative nausea and vomiting)   . Shortness of breath dyspnea    with exertion  . Skipped heart beats   . Small bowel obstruction ([place])    "several times; OR twice" (12/24/2014)  . Tubular adenoma of colon 09/2011  . Wears glasses     Past Surgical History:  Procedure Laterality Date  . ABDOMINAL ADHESION SURGERY  12/24/2014  . ABDOMINAL HYSTERECTOMY  1987   and one ovary  . CATARACT EXTRACTION W/ INTRAOCULAR LENS  IMPLANT, BILATERAL Right 06/22/2012   Dr. Lester Kinsman.   Marland Kitchen CATARACT EXTRACTION W/PHACO Left 06/12/2012   Dr. Boykin Reaper  . CHOLECYSTECTOMY N/A 08/18/2013   Procedure: LAPAROSCOPIC CHOLECYSTECTOMY;  Surgeon: Harl Bowie, MD;  Location: [facility];  Service: General;  Laterality: N/A;  . DILATION AND CURETTAGE OF UTERUS    . EXPLORATORY LAPAROTOMY  12/24/2014  . HERNIA REPAIR    . INCISIONAL HERNIA REPAIR  12/24/2014  . INCISIONAL HERNIA REPAIR N/A 12/24/2014   Procedure: HERNIA REPAIR INCISIONAL;  Surgeon: Coralie Keens, MD;  Location: [facility];  Service: General;  Laterality: N/A;  . LAPAROTOMY N/A 12/24/2014   Procedure: EXPLORATORY LAPAROTOMY;  Surgeon: Coralie Keens,  MD;  Location: [facility];  Service: General;  Laterality: N/A;  . LYSIS OF ADHESION N/A 12/24/2014   Procedure: LYSIS OF ADHESION;  Surgeon: Coralie Keens, MD;  Location: [facility];  Service: General;  Laterality: N/A;  . MULTIPLE TOOTH EXTRACTIONS    . OOPHORECTOMY  1992  . SMALL INTESTINE SURGERY     for a blockage, no bowel was removed, just kinked from scar tissue  . TUBAL LIGATION    . URETHRAL DILATION       reports that she quit smoking about 8 months ago. Her smoking use included Cigarettes. She has a 14.25 pack-year smoking history. She has never used smokeless tobacco. She reports that she does not drink alcohol or use drugs.  Allergies  Allergen Reactions  . Maxidex [Dexamethasone] Other (See Comments)     DEHYDRATION AND HEART RACING  . Vicodin [Hydrocodone-Acetaminophen] Other (See Comments)    Hallucinations  . Advair Diskus [Fluticasone-Salmeterol] Other (See Comments)    No benefit with lungs (INEFFECTIVE)  . Remeron [Mirtazapine] Other (See Comments)    CAUSED NIGHTMARES  . Trazodone And Nefazodone Other (See Comments)    Heart pounding  . Tylox [Oxycodone-Acetaminophen] Itching    Family History  Problem Relation Age of Onset  . Hypertension Mother   . Ovarian cancer Mother   . Glaucoma Mother   . Glaucoma Sister   . Colon cancer Neg Hx   . Colon polyps Neg Hx   . Diabetes Neg Hx   . Kidney disease Neg Hx   . Gallbladder disease Neg Hx   . Esophageal cancer Neg Hx       Prior to Admission medications   Medication Sig Start Date End Date Taking? Authorizing Provider  acetaminophen (TYLENOL) 325 MG tablet Take 325-650 mg by mouth every 6 (six) hours as needed for headache.   Yes Historical Provider, MD  albuterol (PROVENTIL) (2.5 MG/3ML) 0.083% nebulizer solution USE 1 VIAL (3 ML) VIA NEBULIZER EVERY 6 HOURS AS NEEDED FOR WHEEZING OR SHORTNESS OF BREATH Patient taking differently: USE 1 VIAL (3 ML) VIA NEBULIZER THREE TIMES DAILY 10/31/15  Yes [name], MD  amLODipine (NORVASC) 5 MG tablet Take 1 tablet (5 mg total) by mouth daily. 02/21/16  Yes [name], MD  aspirin EC 81 MG tablet Take 81 mg by mouth daily.   Yes Historical Provider, MD  dicyclomine (BENTYL) 20 MG tablet Take 1 tablet (20 mg total) by mouth 4 (four) times daily -  before meals and at bedtime. 02/22/16  Yes [name], MD  ENSURE PLUS (ENSURE PLUS) LIQD Take 237 mLs by mouth See admin instructions. Two to three times a day to cause weight GAIN   Yes Historical Provider, MD  fluticasone furoate-vilanterol (BREO ELLIPTA) 100-25 MCG/INH AEPB USE 1 INHALATION DAILY Patient taking differently: Inhale 1 puff into the lungs every evening.  01/19/16  Yes [name], MD    LORazepam (ATIVAN) 1 MG tablet Take 1 tablet (1 mg total) by mouth daily as needed for anxiety or sleep. Patient taking differently: Take 1 mg by mouth at bedtime. FOR SLEEP 02/22/16 10/04/16 Yes Jade L Breeback, PA-C  losartan (COZAAR) 100 MG tablet Take 1 tablet (100 mg total) by mouth daily. 01/30/16  Yes [name], MD  magnesium oxide (MAG-OX) 400 (241.3 Mg) MG tablet Take 1 tablet (400 mg total) by mouth daily. 12/06/15  Yes Theodis Blaze, MD  Multiple Vitamin (MULTIVITAMIN WITH MINERALS) TABS tablet Take 1 tablet  by mouth daily. Pt uses One-A-Day brand   Yes Historical Provider, MD  nicotine (NICOTROL) 10 MG inhaler Inhale 10 mg into the lungs 2 (two) times daily as needed for smoking cessation.  06/23/15  Yes Historical Provider, MD  omeprazole (PRILOSEC) 40 MG capsule TAKE 1 CAPSULE DAILY FOR GASTROINTESTINAL ULCER 02/24/15  Yes [name], MD  ondansetron (ZOFRAN-ODT) 8 MG disintegrating tablet TAKE 1 TABLET EVERY 8 HOURS AS NEEDED FOR NAUSEA Patient taking differently: Take 8 mg by mouth every 8 (eight) hours as needed for nausea. TAKE 1 TABLET EVERY 8 HOURS AS NEEDED FOR NAUSEA 02/22/16  Yes [name], MD  OXYGEN Inhale 4 L into the lungs continuous.   Yes Historical Provider, MD  PARoxetine (PAXIL) 20 MG tablet Take 1 tablet (20 mg total) by mouth daily. 02/21/16  Yes Sean Hommel, DO  promethazine (PHENERGAN) 25 MG tablet Take 12.5 mg by mouth every 8 (eight) hours as needed for nausea.  12/26/15  Yes Historical Provider, MD  traMADol (ULTRAM) 50 MG tablet Take 1 tablet (50 mg total) by mouth every 6 (six) hours as needed (pain). 02/22/16  Yes Jade L Breeback, PA-C  HYDROcodone-acetaminophen (NORCO) 10-325 MG tablet Take 1 tablet by mouth every 6 (six) hours as needed. Patient taking differently: Take 1 tablet by mouth every 6 (six) hours as needed (for pain).  02/02/16   [name], MD  KLOR-CON M10 10 MEQ tablet Take 1 tablet (10 mEq total) by mouth daily.  02/01/16   [name], MD  Linaclotide Rolan Lipa) 290 MCG CAPS capsule Take 1 capsule (290 mcg total) by mouth daily. 03/08/15   [name], MD  metoprolol succinate (TOPROL-XL) 50 MG 24 hr tablet Take 1 tablet (50 mg total) by mouth daily. 01/30/16   [name], MD  PROAIR HFA 108 [phone] Base) MCG/ACT inhaler USE 2 TO 4 INHALATIONS EVERY 4 TO 6 HOURS AS NEEDED Patient taking differently: USE 2 TO 4 INHALATIONS EVERY 4 TO 6 HOURS AS NEEDED FOR SHORTNESS OF BREATH/WHEEZING 10/31/15   [name], MD  Umeclidinium Bromide (INCRUSE ELLIPTA) 62.5 MCG/INH AEPB Inhale 1 Inhaler into the lungs daily. Patient taking differently: Inhale 1 puff into the lungs every morning.  04/25/15   [name], MD    Physical Exam: Vitals:   03/30/16 1700 03/30/16 1730 03/30/16 1800 03/30/16 1918  BP: (!) 130/106 119/86 128/60 (!) 146/56  Pulse: 75 72 71 71  Resp: '21 16 17 17  '$ Temp:    98.2 F (36.8 C)  TempSrc:    Oral  SpO2: 95% 94% 95% 93%  Weight:    34.8 kg (76 lb 12.8 oz)  Height:    '5\' 3"'$  (1.6 m)      Constitutional: NAD, calm, comfortable Eyes: PERRL, lids and conjunctivae normal ENMT: Mucous membranes are moist. Posterior pharynx clear of any exudate or lesions.Normal dentition.  Neck: normal, supple, no masses, no thyromegaly Respiratory: Few wheezes and rhonchi. Normal respiratory effort. No accessory muscle use.  Cardiovascular: Regular rate and rhythm, no murmurs / rubs / gallops. No extremity edema. 2+ pedal pulses. No carotid bruits.  Abdomen: no tenderness, no masses palpated. No hepatosplenomegaly. Bowel sounds positive.  Musculoskeletal: no clubbing / cyanosis. No joint deformity upper and lower extremities. Good ROM, no contractures. Normal muscle tone.  Skin: no rashes, lesions, ulcers. No induration Neurologic: CN 2-12 grossly intact. Sensation intact, DTR normal. Strength 5/5 in all 4.  Psychiatric: Normal judgment and insight.  Alert and  oriented x 3. Normal mood.    Labs on Admission: I have personally reviewed following labs and imaging studies  CBC:  Recent Labs Lab 03/25/16 0403 03/26/16 0309 03/27/16 0542 03/28/16 0431 03/30/16 1400  WBC 9.3 23.6* 25.9* 12.0* 13.3*  NEUTROABS  --   --   --   --  10.4*  HGB 10.1* 9.5* 10.1* 10.2* 12.5  HCT 31.1* 30.0* 31.3* 31.8* 36.6  MCV 90.7 92.3 89.4 89.6 86.5  PLT 300 382 462* 502* 244*   Basic Metabolic Panel:  Recent Labs Lab 03/25/16 0403 03/26/16 0309 03/27/16 0542 03/28/16 0431 03/30/16 1400  NA 135 132* 129* 130* 129*  K 3.5 4.6 4.2 3.8 3.7  CL 92* 91* 87* 85* 87*  CO2 34* 33* 36* 33* 32  GLUCOSE 237* 119* 63* 89 134*  BUN 9 22* '15 10 14  '$ CREATININE 0.57 0.62 0.46 0.55 0.60  CALCIUM 8.6* 8.9 9.0 8.2* 8.6*   GFR: Estimated Creatinine Clearance: 33.9 mL/min (by C-G formula based on SCr of 0.8 mg/dL). Liver Function Tests:  Recent Labs Lab 03/26/16 0309 03/27/16 0542 03/28/16 0431  AST '21 23 20  '$ ALT 12* 14 15  ALKPHOS 90 88 69  BILITOT 0.4 0.1* 0.1*  PROT 5.7* 5.8* 5.4*  ALBUMIN 2.5* 2.7* 2.6*   No results for input(s): LIPASE, AMYLASE in the last 168 hours. No results for input(s): AMMONIA in the last 168 hours. Coagulation Profile: No results for input(s): INR, PROTIME in the last 168 hours. Cardiac Enzymes:  Recent Labs Lab 03/25/16 0949 03/25/16 1530 03/26/16 0850 03/26/16 1440 03/30/16 1400  TROPONINI <0.03 0.35* 0.06* 0.18* <0.03   BNP (last 3 results) No results for input(s): PROBNP in the last 8760 hours. HbA1C: No results for input(s): HGBA1C in the last 72 hours. CBG:  Recent Labs Lab 03/27/16 1618 03/27/16 2038 03/28/16 0750 03/28/16 1200 03/28/16 1622  GLUCAP 170* 85 79 124* 207*   Lipid Profile: No results for input(s): CHOL, HDL, LDLCALC, TRIG, CHOLHDL, LDLDIRECT in the last 72 hours. Thyroid Function Tests: No results for input(s): TSH, T4TOTAL, FREET4, T3FREE, THYROIDAB in the last 72 hours. Anemia  Panel: No results for input(s): VITAMINB12, FOLATE, FERRITIN, TIBC, IRON, RETICCTPCT in the last 72 hours. Urine analysis:    Component Value Date/Time   COLORURINE YELLOW 03/30/2016 1635   APPEARANCEUR CLOUDY (A) 03/30/2016 1635   LABSPEC 1.019 03/30/2016 1635   PHURINE 7.0 03/30/2016 1635   GLUCOSEU NEGATIVE 03/30/2016 1635   HGBUR TRACE (A) 03/30/2016 1635   BILIRUBINUR NEGATIVE 03/30/2016 1635   BILIRUBINUR NEG 02/12/2014 1207   KETONESUR 15 (A) 03/30/2016 1635   PROTEINUR 100 (A) 03/30/2016 1635   UROBILINOGEN 0.2 03/06/2015 2133   NITRITE NEGATIVE 03/30/2016 1635   LEUKOCYTESUR NEGATIVE 03/30/2016 1635   Sepsis Labs: '@LABRCNTIP'$ (procalcitonin:4,lacticidven:4) ) Recent Results (from the past 240 hour(s))  Blood culture (routine x 2)     Status: None   Collection Time: 03/24/16 10:05 PM  Result Value Ref Range Status   Specimen Description BLOOD LEFT FOREARM  Final   Special Requests   Final    BOTTLES DRAWN AEROBIC AND ANAEROBIC AER 2.5cc ANA 2.5cc   Culture   Final    NO GROWTH 5 DAYS Performed at [facility]    Report Status 03/30/2016 FINAL  Final  Blood culture (routine x 2)     Status: None   Collection Time: 03/24/16 10:05 PM  Result Value Ref Range Status   Specimen Description BLOOD RIGHT  ANTECUBITAL  Final   Special Requests   Final    BOTTLES DRAWN AEROBIC AND ANAEROBIC AER 5cC ANA 5cc   Culture   Final    NO GROWTH 5 DAYS Performed at [facility]    Report Status 03/30/2016 FINAL  Final  Culture, blood (routine x 2) Call MD if unable to obtain prior to antibiotics being given     Status: None   Collection Time: 03/25/16  3:40 AM  Result Value Ref Range Status   Specimen Description BLOOD LEFT ANTECUBITAL  Final   Special Requests BOTTLES DRAWN AEROBIC AND ANAEROBIC 5CC EA  Final   Culture NO GROWTH 5 DAYS  Final   Report Status 03/30/2016 FINAL  Final  Culture, blood (routine x 2) Call MD if unable to obtain prior to antibiotics being  given     Status: None   Collection Time: 03/25/16  3:45 AM  Result Value Ref Range Status   Specimen Description BLOOD LEFT ARM  Final   Special Requests IN PEDIATRIC BOTTLE 3CC  Final   Culture NO GROWTH 5 DAYS  Final   Report Status 03/30/2016 FINAL  Final     Radiological Exams on Admission: Dg Chest 2 View  Result Date: 03/30/2016 CLINICAL DATA:  Shortness of breath productive cough. Chronic lung disease. EXAM: CHEST  2 VIEW COMPARISON:  03/28/2016.  03/24/2016. FINDINGS: The heart size is normal. There is aortic atherosclerosis. Lungs are markedly hyperinflated consistent with emphysema. Infiltrate at the right lung base may have worsened slightly. Small amount of pleural fluid in the posterior costophrenic angles. No acute bone finding. IMPRESSION: Chronic emphysema with hyperinflation. Persistent and probably slightly worsened right lung base pneumonia. Small amount of pleural fluid in the posterior costophrenic angles. Electronically Signed   By: Nelson Chimes M.D.   On: 03/30/2016 14:42    EKG: Independently reviewed.  Assessment/Plan Principal Problem:   HCAP (healthcare-associated pneumonia) Active Problems:   Acute on chronic respiratory failure with hypoxemia (HCC)   COPD exacerbation (HCC)   Essential hypertension    1. HCAP - 1. Due to failure of CAP treatment, will broaden coverage to cefepime and vanc 2. PNA pathway 3. Repeat cultures pending 2. COPD exacerbation - 1. Prednisone daily 2. Adult wheeze protocol 3. Acute on chronic resp fail with hypoxia - 1. Continuous pulse ox 2. Secondary to #1 and #2 above 4. HTN - continue home meds   DVT prophylaxis: Lovenox Code Status: DNR - confirmed with patient Family Communication: No family in room Consults called: None Admission status: Admit to inpatient   Etta Quill DO [facility] Pager [phone] from 7PM-7AM  If 7AM-7PM, please contact the day physician for the  patient [email or web address] Password [facility]  03/30/2016, 8:24 PM

## 2016-03-30 NOTE — ED Triage Notes (Signed)
Pt c/o sob for several weeks, dx with pna on Friday and still isn't feeling any better

## 2016-03-30 NOTE — Progress Notes (Signed)
Pharmacy Antibiotic Note  Tamara Adams is a 75 y.o. female admitted on 03/30/2016 with pneumonia.  Pharmacy has been consulted for vancomycin dosing.  Plan: Vancomycin 1g IV every 24 hours.  Goal trough 15-20 mcg/mL.  Monitor culture data, renal function and clinical course VT at SS prn  Height: '5\' 3"'$  (160 cm) Weight: 83 lb (37.6 kg) IBW/kg (Calculated) : 52.4  Temp (24hrs), Avg:98.1 F (36.7 C), Min:98.1 F (36.7 C), Max:98.1 F (36.7 C)   Recent Labs Lab 03/25/16 0403 03/26/16 0309 03/27/16 0542 03/28/16 0431 03/30/16 1400  WBC 9.3 23.6* 25.9* 12.0* 13.3*  CREATININE 0.57 0.62 0.46 0.55 0.60    Estimated Creatinine Clearance: 36.6 mL/min (by C-G formula based on SCr of 0.8 mg/dL).    Allergies  Allergen Reactions  . Maxidex [Dexamethasone] Other (See Comments)    DEHYDRATION AND HEART RACING  . Vicodin [Hydrocodone-Acetaminophen] Other (See Comments)    Hallucinations  . Advair Diskus [Fluticasone-Salmeterol] Other (See Comments)    No benefit with lungs (INEFFECTIVE)  . Remeron [Mirtazapine] Other (See Comments)    CAUSED NIGHTMARES  . Trazodone And Nefazodone Other (See Comments)    Heart pounding  . Tylox [Oxycodone-Acetaminophen] Itching    Antimicrobials this admission: Vanc 9/8 >>  Cefepime 9/8 >>    Andrey Cota. Diona Foley, PharmD, [place] Clinical Pharmacist Pager [phone] 03/30/2016 4:02 PM

## 2016-03-30 NOTE — Progress Notes (Signed)
Pt's order for Troponin cannot be viewed by the Lab. MD notified to verify the placement mode. New order was put in. Troponin is being collected. Will continue to monitor.

## 2016-03-30 NOTE — ED Notes (Signed)
Pt on cardiac monitor and continuous pulse ox

## 2016-03-30 NOTE — ED Notes (Signed)
PA at bedside.

## 2016-03-31 DIAGNOSIS — E43 Unspecified severe protein-calorie malnutrition: Secondary | ICD-10-CM

## 2016-03-31 DIAGNOSIS — J9621 Acute and chronic respiratory failure with hypoxia: Secondary | ICD-10-CM

## 2016-03-31 DIAGNOSIS — K219 Gastro-esophageal reflux disease without esophagitis: Secondary | ICD-10-CM

## 2016-03-31 DIAGNOSIS — J441 Chronic obstructive pulmonary disease with (acute) exacerbation: Secondary | ICD-10-CM

## 2016-03-31 DIAGNOSIS — I10 Essential (primary) hypertension: Secondary | ICD-10-CM

## 2016-03-31 DIAGNOSIS — J189 Pneumonia, unspecified organism: Secondary | ICD-10-CM

## 2016-03-31 LAB — TROPONIN I
Troponin I: <0.03 ng/mL (ref <0.03)
Troponin I: <0.03 ng/mL (ref <0.03)

## 2016-03-31 LAB — STREP PNEUMONIAE URINARY ANTIGEN: Strep Pneumo Urinary Antigen: NEGATIVE

## 2016-03-31 LAB — HIV ANTIBODY (ROUTINE TESTING W REFLEX): HIV Screen 4th Generation wRfx: NONREACTIVE

## 2016-03-31 MED ORDER — BUDESONIDE 0.25 MG/2ML IN SUSP
0.2500 mg | Freq: Two times a day (BID) | RESPIRATORY_TRACT | Status: DC
  Administered 2016-03-31 – 2016-04-03 (×7): 0.25 mg via RESPIRATORY_TRACT
  Filled 2016-03-31 (×7): qty 2

## 2016-03-31 MED ORDER — BOOST PLUS PO LIQD
237.0000 mL | Freq: Three times a day (TID) | ORAL | Status: DC
  Administered 2016-03-31 – 2016-04-02 (×6): 237 mL via ORAL
  Filled 2016-03-31 (×14): qty 237

## 2016-03-31 MED ORDER — METHYLPREDNISOLONE SODIUM SUCC 125 MG IJ SOLR
60.0000 mg | Freq: Three times a day (TID) | INTRAMUSCULAR | Status: DC
  Administered 2016-03-31 – 2016-04-02 (×8): 60 mg via INTRAVENOUS
  Filled 2016-03-31 (×8): qty 2

## 2016-03-31 MED ORDER — BISACODYL 10 MG RE SUPP
10.0000 mg | Freq: Every day | RECTAL | Status: DC | PRN
  Administered 2016-03-31: 10 mg via RECTAL
  Filled 2016-03-31: qty 1

## 2016-03-31 MED ORDER — BOOST PLUS PO LIQD: 237.0000 mL | Freq: Three times a day (TID) | ORAL | Status: DC

## 2016-03-31 MED ORDER — ALBUTEROL SULFATE (2.5 MG/3ML) 0.083% IN NEBU
2.5000 mg | INHALATION_SOLUTION | Freq: Three times a day (TID) | RESPIRATORY_TRACT | Status: DC
  Administered 2016-03-31 – 2016-04-03 (×11): 2.5 mg via RESPIRATORY_TRACT
  Filled 2016-03-31 (×11): qty 3

## 2016-03-31 MED ORDER — DOCUSATE SODIUM 100 MG PO CAPS
100.0000 mg | ORAL_CAPSULE | Freq: Two times a day (BID) | ORAL | Status: DC
  Administered 2016-03-31 – 2016-04-03 (×5): 100 mg via ORAL
  Filled 2016-03-31 (×7): qty 1

## 2016-03-31 MED ORDER — ENOXAPARIN SODIUM 30 MG/0.3ML ~~LOC~~ SOLN
30.0000 mg | SUBCUTANEOUS | Status: DC
  Administered 2016-03-31 – 2016-04-02 (×3): 30 mg via SUBCUTANEOUS
  Filled 2016-03-31 (×3): qty 0.3

## 2016-03-31 MED ORDER — POLYETHYLENE GLYCOL 3350 17 G PO PACK
17.0000 g | PACK | Freq: Every day | ORAL | Status: DC
  Administered 2016-03-31 – 2016-04-02 (×3): 17 g via ORAL
  Filled 2016-03-31 (×4): qty 1

## 2016-03-31 MED ORDER — VANCOMYCIN HCL IN DEXTROSE 750-5 MG/150ML-% IV SOLN
750.0000 mg | INTRAVENOUS | Status: DC
  Administered 2016-03-31 – 2016-04-02 (×3): 750 mg via INTRAVENOUS
  Filled 2016-03-31 (×3): qty 150

## 2016-03-31 MED ORDER — GUAIFENESIN ER 600 MG PO TB12
600.0000 mg | ORAL_TABLET | Freq: Two times a day (BID) | ORAL | Status: DC
  Administered 2016-03-31 – 2016-04-03 (×7): 600 mg via ORAL
  Filled 2016-03-31 (×7): qty 1

## 2016-03-31 MED ORDER — ENOXAPARIN SODIUM 40 MG/0.4ML ~~LOC~~ SOLN: 40.0000 mg | SUBCUTANEOUS | Status: DC

## 2016-03-31 NOTE — Progress Notes (Signed)
[facility] PROGRESS NOTE  Tamara Adams HYW:[id number] DOB: 02/21/1941 DOA: 03/30/2016 PCP: Beatrice Lecher, MD Interim summary and HPI 75 y.o. 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  Patient was just in the hospital from 9/2-9/6 with CAP and COPD exacerbation.  Despite treatment with rocephin / azithromycin and discharge on zithromax and amoxicillin to complete therapy; patients symptoms have become worse at home.  She has increased cough, wheezing, SOB. Has al noticed increase O2 at home up to 6L.  No fever, chills, N/V/D, rash, dysuria.  Patient returns to ED for worsening symptoms. CXR demonstrated worsening infiltrates.  Assessment/Plan: 1-acute on chronic resp failure with hypoxia: due to HCAP and COPD exacerbation -will continue broad spectrum abx's -continue nebulizer treatments and oxygen supplementation  -will start pulmicort, solumedrol and flutter valve  -will follow culture data  2-essential HTN: -stable -will continue home antihypertensive regimen   3-severe protein calorie malnutrition  -will follow nutritional service rec's -using Boost TID  4-GERD -continue PPI  5-depression/anxiety -will continue paxil and PRN ativan   Code Status: DNR Family Communication: no family at bedside  Disposition Plan: will continue IV antibiotics, will start solumedrol, pulmicort and flutter valve. Patient with SOB on minimal exertion and complaining of productive cough. Will need at least another 2 days for acute stabilization of her breathing. Will also need PT eval   Consultants:  None   Procedures:  See below for x-ray reports   Antibiotics:  Vancomycin 9/8  Cefepime 9/8  HPI/Subjective: Feeling SOB and complaining of productive cough. Currently afebrile.  Objective: Vitals:   03/31/16 0525 03/31/16 1449  BP: 130/61 117/61  Pulse: 80 66  Resp: 18 16  Temp: 98.3 F (36.8 C) 98.4 F (36.9 C)    Intake/Output  Summary (Last 24 hours) at 03/31/16 1547 Last data filed at 03/31/16 0927  Gross per 24 hour  Intake              120 ml  Output              450 ml  Net             -330 ml   Filed Weights   03/30/16 1331 03/30/16 1918  Weight: 37.6 kg (83 lb) 34.8 kg (76 lb 12.8 oz)    Exam:   General:  Underweight, frail and in no distress. Currently afebrile, reports feeling SOB with minimal exertion and with productive coughing   Cardiovascular: S1 and S2, no rubs, no gallops  Respiratory: scattered rhonchi, decrease BS at bases, positive ex wheezing. No using accessory muscles.  Abdomen: soft, NT, positive BS  Musculoskeletal: no edema or cyanosis   Data Reviewed: Basic Metabolic Panel:  Recent Labs Lab 03/25/16 0403 03/26/16 0309 03/27/16 0542 03/28/16 0431 03/30/16 1400  NA 135 132* 129* 130* 129*  K 3.5 4.6 4.2 3.8 3.7  CL 92* 91* 87* 85* 87*  CO2 34* 33* 36* 33* 32  GLUCOSE 237* 119* 63* 89 134*  BUN 9 22* '15 10 14  '$ CREATININE 0.57 0.62 0.46 0.55 0.60  CALCIUM 8.6* 8.9 9.0 8.2* 8.6*   Liver Function Tests:  Recent Labs Lab 03/26/16 0309 03/27/16 0542 03/28/16 0431  AST '21 23 20  '$ ALT 12* 14 15  ALKPHOS 90 88 69  BILITOT 0.4 0.1* 0.1*  PROT 5.7* 5.8* 5.4*  ALBUMIN 2.5* 2.7* 2.6*   CBC:  Recent Labs Lab 03/25/16 0403 03/26/16 0309 03/27/16 0542 03/28/16 0431 03/30/16 1400  WBC  9.3 23.6* 25.9* 12.0* 13.3*  NEUTROABS  --   --   --   --  10.4*  HGB 10.1* 9.5* 10.1* 10.2* 12.5  HCT 31.1* 30.0* 31.3* 31.8* 36.6  MCV 90.7 92.3 89.4 89.6 86.5  PLT 300 382 462* 502* 800*   Cardiac Enzymes:  Recent Labs Lab 03/26/16 1440 03/30/16 1400 03/31/16 0024 03/31/16 0543 03/31/16 1156  TROPONINI 0.18* <0.03 <0.03 <0.03 <0.03   BNP (last 3 results)  Recent Labs  03/24/16 2215 03/30/16 1400  BNP 140.4* 54.3   CBG:  Recent Labs Lab 03/27/16 1618 03/27/16 2038 03/28/16 0750 03/28/16 1200 03/28/16 1622  GLUCAP 170* 85 79 124* 207*    Recent  Results (from the past 240 hour(s))  Blood culture (routine x 2)     Status: None   Collection Time: 03/24/16 10:05 PM  Result Value Ref Range Status   Specimen Description BLOOD LEFT FOREARM  Final   Special Requests   Final    BOTTLES DRAWN AEROBIC AND ANAEROBIC AER 2.5cc ANA 2.5cc   Culture   Final    NO GROWTH 5 DAYS Performed at [facility]    Report Status 03/30/2016 FINAL  Final  Blood culture (routine x 2)     Status: None   Collection Time: 03/24/16 10:05 PM  Result Value Ref Range Status   Specimen Description BLOOD RIGHT ANTECUBITAL  Final   Special Requests   Final    BOTTLES DRAWN AEROBIC AND ANAEROBIC AER 5cC ANA 5cc   Culture   Final    NO GROWTH 5 DAYS Performed at [facility]    Report Status 03/30/2016 FINAL  Final  Culture, blood (routine x 2) Call MD if unable to obtain prior to antibiotics being given     Status: None   Collection Time: 03/25/16  3:40 AM  Result Value Ref Range Status   Specimen Description BLOOD LEFT ANTECUBITAL  Final   Special Requests BOTTLES DRAWN AEROBIC AND ANAEROBIC 5CC EA  Final   Culture NO GROWTH 5 DAYS  Final   Report Status 03/30/2016 FINAL  Final  Culture, blood (routine x 2) Call MD if unable to obtain prior to antibiotics being given     Status: None   Collection Time: 03/25/16  3:45 AM  Result Value Ref Range Status   Specimen Description BLOOD LEFT ARM  Final   Special Requests IN PEDIATRIC BOTTLE 3CC  Final   Culture NO GROWTH 5 DAYS  Final   Report Status 03/30/2016 FINAL  Final  Culture, blood (routine x 2) Call MD if unable to obtain prior to antibiotics being given     Status: None (Preliminary result)   Collection Time: 03/30/16  8:54 PM  Result Value Ref Range Status   Specimen Description BLOOD RIGHT ANTECUBITAL  Final   Special Requests BOTTLES DRAWN AEROBIC AND ANAEROBIC 10CC  Final   Culture NO GROWTH < 12 HOURS  Final   Report Status PENDING  Incomplete  Culture, blood (routine x 2) Call MD  if unable to obtain prior to antibiotics being given     Status: None (Preliminary result)   Collection Time: 03/30/16  9:02 PM  Result Value Ref Range Status   Specimen Description BLOOD RIGHT FOREARM  Final   Special Requests BOTTLES DRAWN AEROBIC AND ANAEROBIC 10CC  Final   Culture NO GROWTH < 12 HOURS  Final   Report Status PENDING  Incomplete     Studies: Dg Chest 2 View  Result Date: 03/30/2016 CLINICAL DATA:  Shortness of breath productive cough. Chronic lung disease. EXAM: CHEST  2 VIEW COMPARISON:  03/28/2016.  03/24/2016. FINDINGS: The heart size is normal. There is aortic atherosclerosis. Lungs are markedly hyperinflated consistent with emphysema. Infiltrate at the right lung base may have worsened slightly. Small amount of pleural fluid in the posterior costophrenic angles. No acute bone finding. IMPRESSION: Chronic emphysema with hyperinflation. Persistent and probably slightly worsened right lung base pneumonia. Small amount of pleural fluid in the posterior costophrenic angles. Electronically Signed   By: Nelson Chimes M.D.   On: 03/30/2016 14:42    Scheduled Meds: . albuterol  2.5 mg Nebulization TID  . amLODipine  5 mg Oral Daily  . aspirin EC  81 mg Oral Daily  . budesonide (PULMICORT) nebulizer solution  0.25 mg Nebulization BID  . ceFEPime (MAXIPIME) IV  1 g Intravenous Q24H  . dicyclomine  20 mg Oral TID AC & HS  . docusate sodium  100 mg Oral BID  . enoxaparin (LOVENOX) injection  30 mg Subcutaneous Q24H  . guaiFENesin  600 mg Oral BID  . lactose free nutrition  237 mL Oral TID BM  . linaclotide  290 mcg Oral Daily  . losartan  100 mg Oral Daily  . magnesium oxide  400 mg Oral Daily  . methylPREDNISolone (SOLU-MEDROL) injection  60 mg Intravenous Q8H  . metoprolol succinate  50 mg Oral Daily  . multivitamin with minerals  1 tablet Oral Daily  . pantoprazole  80 mg Oral Daily  . PARoxetine  20 mg Oral Daily  . polyethylene glycol  17 g Oral Daily  . potassium  chloride  10 mEq Oral Daily  . umeclidinium bromide  1 puff Inhalation q morning - 10a  . vancomycin  750 mg Intravenous Q24H   Continuous Infusions:   Principal Problem:   HCAP (healthcare-associated pneumonia) Active Problems:   Acute on chronic respiratory failure with hypoxemia (HCC)   COPD exacerbation ([place])   Essential hypertension    Time spent: 30 minutes    Barton Dubois  [facility] Pager [phone]. If 7PM-7AM, please contact night-coverage at [email or web address], password [facility] 03/31/2016, 3:47 PM  LOS: 1 day

## 2016-03-31 NOTE — Progress Notes (Signed)
Initial Nutrition Assessment  DOCUMENTATION CODES:  Severe malnutrition in context of chronic illness, Underweight   Pt meets criteria for SEVERE MALNUTRITION in the context of Chronic Illness as evidenced by Severe Fat/muscle wasting.  INTERVENTION:  Regular Boost TID.   NUTRITION DIAGNOSIS:  Increased nutrient needs related to catabolic illness (End stage COPD) as evidenced by severe depletion of muscle and fat mass  GOAL:  Patient will meet greater than or equal to 90% of their needs  MONITOR:  PO intake, Supplement acceptance, Labs  REASON FOR ASSESSMENT:  Malnutrition Screening Tool    ASSESSMENT:  75 y/o female PMHx COPD on 4L at home, HTN, HLD, Depression, Anxiety, GERD. Patient was just recently discharged on 9/6 after having CAP+COPD exacerbation. Despite treatment, pt again decompensated at home. Readmitted for continued treatment of PNA.     Pt is extremely pleasant. Contrary to her MST report of eating 6 small meals a day, she says she only eats 2x a day. She states she has been instructed to drink 3 Boost/Ensure Supplements a day, but says she typically is only able to two in. Acknowledged that given her size, even 2 is impressive.   She states her UBW is 73-79 lbs. She denies any change in swelling. She was 87 lbs at this time last year which is a loss of 13%. Her weight is more or less stable for the last 3 months, maybe down a lb or so.   Today she notes some constipation and was asking for stool softener. She states last BM 4 days ago. She takes stool softeners regularly. Also with some nausea this morning, but she says she has chronic nausea that she attributes to her many medications. Potentially worsened by abx.   NFPE: Remains profoundly cachectic.   Medications: IV abx, colace, Linzess, mag ox, solumedrol, mvi with min, ppi, kcl, miralax,   Labs reviewed: Hyponatremic, WBC: 13.3, Albumin: 2.6  Recent Labs Lab 03/27/16 0542 03/28/16 0431 03/30/16 1400   NA 129* 130* 129*  K 4.2 3.8 3.7  CL 87* 85* 87*  CO2 36* 33* 32  BUN '15 10 14  '$ CREATININE 0.46 0.55 0.60  CALCIUM 9.0 8.2* 8.6*  GLUCOSE 63* 89 134*    Diet Order:  Diet regular Room service appropriate? Yes; Fluid consistency: Thin  Skin: Pale, dry, scabs   Last BM:  9/4-constipated  Height:  Ht Readings from Last 1 Encounters:  03/30/16 '5\' 3"'$  (1.6 m)   Weight:  Wt Readings from Last 1 Encounters:  03/30/16 76 lb 12.8 oz (34.8 kg)   Wt Readings from Last 10 Encounters:  03/30/16 76 lb 12.8 oz (34.8 kg)  03/27/16 83 lb 1.6 oz (37.7 kg)  03/08/16 78 lb (35.4 kg)  02/16/16 79 lb (35.8 kg)  02/07/16 78 lb (35.4 kg)  01/04/16 76 lb (34.5 kg)  12/16/15 79 lb (35.8 kg)  12/15/15 80 lb 1.6 oz (36.3 kg)  12/02/15 82 lb (37.2 kg)  08/25/15 83 lb (37.6 kg)   Ideal Body Weight:  52.27 kg  BMI:  Body mass index is 13.6 kg/m.  Estimated Nutritional Needs:  Kcal:  >1400 kcals (40 kcal/kg bw) Protein:  >52 g (>1.5 g/kg bw) Fluid:  > 1.1 liters (30 ml/kg bw)  EDUCATION NEEDS:  No education needs identified at this time  Burtis Junes RD, LDN, [place] [facility] Pager: [phone] 03/31/2016 11:16 AM

## 2016-03-31 NOTE — Progress Notes (Addendum)
Pharmacy Antibiotic Note  Tamara Adams is a 75 y.o. female admitted on 03/30/2016 with pneumonia.  Pharmacy has been consulted for vancomycin and cefepime dosing. Today is day 2 of 8 for abx therapy. Afeb, WBC 25.9>12>13.3. CrCl ~ 33 ml/min  Plan: -Continue cefepime 1 g IV every 24 hours -Decrease vancomycin to 750 mg IV every 24 hours -Goal trough 15-20 mcg/mL. Monitor VT at [facility] prn -Monitor culture data, renal function and clinical course -F/u ability to de-escalate   Height: '5\' 3"'$  (160 cm) Weight: 76 lb 12.8 oz (34.8 kg) IBW/kg (Calculated) : 52.4  Temp (24hrs), Avg:98.2 F (36.8 C), Min:98.1 F (36.7 C), Max:98.3 F (36.8 C)   Recent Labs Lab 03/25/16 0403 03/26/16 0309 03/27/16 0542 03/28/16 0431 03/30/16 1400  WBC 9.3 23.6* 25.9* 12.0* 13.3*  CREATININE 0.57 0.62 0.46 0.55 0.60    Estimated Creatinine Clearance: 33.9 mL/min (by C-G formula based on SCr of 0.8 mg/dL).    Allergies  Allergen Reactions  . Maxidex [Dexamethasone] Other (See Comments)    DEHYDRATION AND HEART RACING  . Vicodin [Hydrocodone-Acetaminophen] Other (See Comments)    Hallucinations  . Advair Diskus [Fluticasone-Salmeterol] Other (See Comments)    No benefit with lungs (INEFFECTIVE)  . Remeron [Mirtazapine] Other (See Comments)    CAUSED NIGHTMARES  . Trazodone And Nefazodone Other (See Comments)    Heart pounding  . Tylox [Oxycodone-Acetaminophen] Itching    Antimicrobials this admission: 9/8 vanc>> 9/8 cefepime>>  Dose adjustments this admission: 9/9 vanc 1000 mg >> 750 mg q24h  Microbiology results: 9/8 BCx: pending 9/8 UCx: sent 9/8 Urine strep pneumo: neg 9/3 BCx: NGF  Thank you for allowing pharmacy to be a part of this patient's care.  Gwenlyn Perking, PharmD PGY1 Pharmacy Resident Pager: [phone] 03/31/2016 12:07 PM

## 2016-04-01 LAB — URINE CULTURE

## 2016-04-01 LAB — CBC
HCT: 31.7 % — ABNORMAL LOW (ref 36.0–46.0)
Hemoglobin: 10.1 g/dL — ABNORMAL LOW (ref 12.0–15.0)
MCH: 28.4 pg (ref 26.0–34.0)
MCHC: 31.9 g/dL (ref 30.0–36.0)
MCV: 89 fL (ref 78.0–100.0)
PLATELETS: 710 10*3/uL — AB (ref 150–400)
RBC: 3.56 MIL/uL — ABNORMAL LOW (ref 3.87–5.11)
RDW: 12.7 % (ref 11.5–15.5)
WBC: 13.6 10*3/uL — AB (ref 4.0–10.5)

## 2016-04-01 LAB — BASIC METABOLIC PANEL
ANION GAP: 8 (ref 5–15)
BUN: 16 mg/dL (ref 6–20)
CALCIUM: 8.8 mg/dL — AB (ref 8.9–10.3)
CO2: 33 mmol/L — ABNORMAL HIGH (ref 22–32)
Chloride: 86 mmol/L — ABNORMAL LOW (ref 101–111)
Creatinine, Ser: 0.61 mg/dL (ref 0.44–1.00)
GFR calc Af Amer: >60 mL/min (ref >60)
GLUCOSE: 134 mg/dL — AB (ref 65–99)
Potassium: 4.7 mmol/L (ref 3.5–5.1)
Sodium: 127 mmol/L — ABNORMAL LOW (ref 135–145)

## 2016-04-01 MED ORDER — HALOPERIDOL LACTATE 5 MG/ML IJ SOLN
2.5000 mg | Freq: Once | INTRAMUSCULAR | Status: AC
  Administered 2016-04-02: 2.5 mg via INTRAVENOUS
  Filled 2016-04-01: qty 1

## 2016-04-01 NOTE — Progress Notes (Signed)
[facility] PROGRESS NOTE  Tamara Adams [id number]:[id number] DOB: 1940/11/12 DOA: 03/30/2016 PCP: Beatrice Lecher, MD Interim summary and HPI 75 y.o. 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  Patient was just in the hospital from 9/2-9/6 with CAP and COPD exacerbation.  Despite treatment with rocephin / azithromycin and discharge on zithromax and amoxicillin to complete therapy; patients symptoms have become worse at home.  She has increased cough, wheezing, SOB. Has al noticed increase O2 at home up to 6L.  No fever, chills, N/V/D, rash, dysuria.  Patient returns to ED for worsening symptoms. CXR demonstrated worsening infiltrates.  Assessment/Plan: 1-acute on chronic resp failure with hypoxia: due to HCAP and COPD exacerbation -will continue broad spectrum abx's -continue nebulizer treatments and oxygen supplementation (will weaned to baseline as tolerated)  -will continue pulmicort, solumedrol and flutter valve  -will follow culture data  2-essential HTN: -stable -will continue home antihypertensive regimen   3-severe protein calorie malnutrition  -will follow nutritional service rec's -using Boost TID  4-GERD -continue PPI  5-depression/anxiety -will continue paxil and PRN ativan   Code Status: DNR Family Communication: no family at bedside  Disposition Plan: will continue IV antibiotics, will start solumedrol, pulmicort and flutter valve. Patient with SOB on minimal exertion and complaining of productive cough. Will need at least another 2 days for acute stabilization of her breathing. Will also need PT eval   Consultants:  None   Procedures:  See below for x-ray reports   Antibiotics:  Vancomycin 9/8  Cefepime 9/8  HPI/Subjective: Feeling somewhat better; but still SOB on minimal exertion, feeling tired/fatigued and complaining of productive cough. Currently afebrile.  Objective: Vitals:   04/01/16 0450 04/01/16 1443  BP:  120/61 (!) 114/98  Pulse: 66 68  Resp: 20 20  Temp: 98.5 F (36.9 C) 98.1 F (36.7 C)    Intake/Output Summary (Last 24 hours) at 04/01/16 1727 Last data filed at 04/01/16 1500  Gross per 24 hour  Intake              200 ml  Output              400 ml  Net             -200 ml   Filed Weights   03/30/16 1331 03/30/16 1918  Weight: 37.6 kg (83 lb) 34.8 kg (76 lb 12.8 oz)    Exam:   General:  Underweight, frail and in no distress. Currently afebrile, reports continue feeling SOB with minimal exertion and with productive coughing; even better today than on admission. [place] at 5L  Cardiovascular: S1 and S2, no rubs, no gallops  Respiratory: scattered rhonchi, decrease BS at bases, positive ex wheezing. No using accessory muscles.  Abdomen: soft, NT, positive BS  Musculoskeletal: no edema or cyanosis   Data Reviewed: Basic Metabolic Panel:  Recent Labs Lab 03/26/16 0309 03/27/16 0542 03/28/16 0431 03/30/16 1400 04/01/16 0858  NA 132* 129* 130* 129* 127*  K 4.6 4.2 3.8 3.7 4.7  CL 91* 87* 85* 87* 86*  CO2 33* 36* 33* 32 33*  GLUCOSE 119* 63* 89 134* 134*  BUN 22* '15 10 14 16  '$ CREATININE 0.62 0.46 0.55 0.60 0.61  CALCIUM 8.9 9.0 8.2* 8.6* 8.8*   Liver Function Tests:  Recent Labs Lab 03/26/16 0309 03/27/16 0542 03/28/16 0431  AST '21 23 20  '$ ALT 12* 14 15  ALKPHOS 90 88 69  BILITOT 0.4 0.1* 0.1*  PROT 5.7* 5.8*  5.4*  ALBUMIN 2.5* 2.7* 2.6*   CBC:  Recent Labs Lab 03/26/16 0309 03/27/16 0542 03/28/16 0431 03/30/16 1400 04/01/16 0858  WBC 23.6* 25.9* 12.0* 13.3* 13.6*  NEUTROABS  --   --   --  10.4*  --   HGB 9.5* 10.1* 10.2* 12.5 10.1*  HCT 30.0* 31.3* 31.8* 36.6 31.7*  MCV 92.3 89.4 89.6 86.5 89.0  PLT 382 462* 502* 800* 710*   Cardiac Enzymes:  Recent Labs Lab 03/26/16 1440 03/30/16 1400 03/31/16 0024 03/31/16 0543 03/31/16 1156  TROPONINI 0.18* <0.03 <0.03 <0.03 <0.03   BNP (last 3 results)  Recent Labs  03/24/16 2215  03/30/16 1400  BNP 140.4* 54.3   CBG:  Recent Labs Lab 03/27/16 1618 03/27/16 2038 03/28/16 0750 03/28/16 1200 03/28/16 1622  GLUCAP 170* 85 79 124* 207*    Recent Results (from the past 240 hour(s))  Blood culture (routine x 2)     Status: None   Collection Time: 03/24/16 10:05 PM  Result Value Ref Range Status   Specimen Description BLOOD LEFT FOREARM  Final   Special Requests   Final    BOTTLES DRAWN AEROBIC AND ANAEROBIC AER 2.5cc ANA 2.5cc   Culture   Final    NO GROWTH 5 DAYS Performed at [facility]    Report Status 03/30/2016 FINAL  Final  Blood culture (routine x 2)     Status: None   Collection Time: 03/24/16 10:05 PM  Result Value Ref Range Status   Specimen Description BLOOD RIGHT ANTECUBITAL  Final   Special Requests   Final    BOTTLES DRAWN AEROBIC AND ANAEROBIC AER 5cC ANA 5cc   Culture   Final    NO GROWTH 5 DAYS Performed at [facility]    Report Status 03/30/2016 FINAL  Final  Culture, blood (routine x 2) Call MD if unable to obtain prior to antibiotics being given     Status: None   Collection Time: 03/25/16  3:40 AM  Result Value Ref Range Status   Specimen Description BLOOD LEFT ANTECUBITAL  Final   Special Requests BOTTLES DRAWN AEROBIC AND ANAEROBIC 5CC EA  Final   Culture NO GROWTH 5 DAYS  Final   Report Status 03/30/2016 FINAL  Final  Culture, blood (routine x 2) Call MD if unable to obtain prior to antibiotics being given     Status: None   Collection Time: 03/25/16  3:45 AM  Result Value Ref Range Status   Specimen Description BLOOD LEFT ARM  Final   Special Requests IN PEDIATRIC BOTTLE 3CC  Final   Culture NO GROWTH 5 DAYS  Final   Report Status 03/30/2016 FINAL  Final  Urine culture     Status: Abnormal   Collection Time: 03/30/16  4:35 PM  Result Value Ref Range Status   Specimen Description URINE, RANDOM  Final   Special Requests NONE  Final   Culture (A)  Final    7,000 COLONIES/mL INSIGNIFICANT  GROWTH Performed at [facility]    Report Status 04/01/2016 FINAL  Final  Culture, blood (routine x 2) Call MD if unable to obtain prior to antibiotics being given     Status: None (Preliminary result)   Collection Time: 03/30/16  8:54 PM  Result Value Ref Range Status   Specimen Description BLOOD RIGHT ANTECUBITAL  Final   Special Requests BOTTLES DRAWN AEROBIC AND ANAEROBIC 10CC  Final   Culture NO GROWTH 2 DAYS  Final   Report Status PENDING  Incomplete  Culture, blood (routine x 2) Call MD if unable to obtain prior to antibiotics being given     Status: None (Preliminary result)   Collection Time: 03/30/16  9:02 PM  Result Value Ref Range Status   Specimen Description BLOOD RIGHT FOREARM  Final   Special Requests BOTTLES DRAWN AEROBIC AND ANAEROBIC 10CC  Final   Culture NO GROWTH 2 DAYS  Final   Report Status PENDING  Incomplete     Studies: No results found.  Scheduled Meds: . albuterol  2.5 mg Nebulization TID  . amLODipine  5 mg Oral Daily  . aspirin EC  81 mg Oral Daily  . budesonide (PULMICORT) nebulizer solution  0.25 mg Nebulization BID  . ceFEPime (MAXIPIME) IV  1 g Intravenous Q24H  . dicyclomine  20 mg Oral TID AC & HS  . docusate sodium  100 mg Oral BID  . enoxaparin (LOVENOX) injection  30 mg Subcutaneous Q24H  . guaiFENesin  600 mg Oral BID  . lactose free nutrition  237 mL Oral TID BM  . linaclotide  290 mcg Oral Daily  . losartan  100 mg Oral Daily  . magnesium oxide  400 mg Oral Daily  . methylPREDNISolone (SOLU-MEDROL) injection  60 mg Intravenous Q8H  . metoprolol succinate  50 mg Oral Daily  . multivitamin with minerals  1 tablet Oral Daily  . pantoprazole  80 mg Oral Daily  . PARoxetine  20 mg Oral Daily  . polyethylene glycol  17 g Oral Daily  . potassium chloride  10 mEq Oral Daily  . umeclidinium bromide  1 puff Inhalation q morning - 10a  . vancomycin  750 mg Intravenous Q24H   Continuous Infusions:   Principal Problem:   HCAP  (healthcare-associated pneumonia) Active Problems:   Acute on chronic respiratory failure with hypoxemia (HCC)   COPD exacerbation ([place])   Essential hypertension    Time spent: 30 minutes    Barton Dubois  [facility] Pager [phone]. If 7PM-7AM, please contact night-coverage at [email or web address], password [facility] 04/01/2016, 5:27 PM  LOS: 2 days

## 2016-04-01 NOTE — Plan of Care (Signed)
Problem: Safety: Goal: Ability to remain free from injury will improve Outcome: Progressing Safety sitter at bedside

## 2016-04-02 MED ORDER — PREDNISONE 50 MG PO TABS
50.0000 mg | ORAL_TABLET | Freq: Every day | ORAL | Status: DC
  Administered 2016-04-03: 50 mg via ORAL
  Filled 2016-04-02: qty 1

## 2016-04-02 MED ORDER — DOXYCYCLINE HYCLATE 100 MG PO TABS
100.0000 mg | ORAL_TABLET | Freq: Two times a day (BID) | ORAL | Status: DC
  Administered 2016-04-02 – 2016-04-03 (×2): 100 mg via ORAL
  Filled 2016-04-02 (×2): qty 1

## 2016-04-02 MED ORDER — SODIUM CHLORIDE 0.9 % IV SOLN: INTRAVENOUS | Status: AC

## 2016-04-02 NOTE — Progress Notes (Signed)
[facility] PROGRESS NOTE  Tamara Adams WGY:[id number] DOB: January 20, 1941 DOA: 03/30/2016 PCP: Beatrice Lecher, MD Interim summary and HPI 75 y.o. 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  Patient was just in the hospital from 9/2-9/6 with CAP and COPD exacerbation.  Despite treatment with rocephin / azithromycin and discharge on zithromax and amoxicillin to complete therapy; patients symptoms have become worse at home.  She has increased cough, wheezing, SOB. Has al noticed increase O2 at home up to 6L.  No fever, chills, N/V/D, rash, dysuria.  Patient returns to ED for worsening symptoms. CXR demonstrated worsening infiltrates.  Assessment/Plan: 1-acute on chronic resp failure with hypoxia: due to HCAP and COPD exacerbation -will transition to doxycycline BID -continue nebulizer treatments and oxygen supplementation (now at baseline rate)  -will continue pulmicort, transitioned to prednisone tapering and continue flutter valve  -will follow culture data; so far no growth up to date  2-essential HTN: -stable -will continue home antihypertensive regimen   3-severe protein calorie malnutrition  -will follow nutritional service rec's -using Boost TID  4-GERD -continue PPI  5-depression/anxiety -will continue paxil and PRN ativan   6-physical deconditioning  -will arrange fo [facility] services at discharge ([place] [facility] [place]) -follow assessment rec's will request for DME rollator   Code Status: DNR Family Communication: no family at bedside  Disposition Plan: will transition steroids to PO and also switch antibiotics to PO. Will observe overnight for stability and to guaranteed  No bronchospasm on transition. If stable will discharge home in am.  Consultants:  None   Procedures:  See below for x-ray reports   Antibiotics:  Vancomycin 9/8>>>9/11  Cefepime 9/8>>9/11  Doxycycline 9/11  HPI/Subjective: Feeling better today; speaking in full  sentences and back to baseline oxygen supplementation. Has remained afebrile.  Objective: Vitals:   04/02/16 0455 04/02/16 1409  BP: (!) 145/61 (!) 142/53  Pulse: 62 67  Resp: 18 14  Temp: 98.1 F (36.7 C) 98.3 F (36.8 C)    Intake/Output Summary (Last 24 hours) at 04/02/16 1757 Last data filed at 04/02/16 1723  Gross per 24 hour  Intake              640 ml  Output             1696 ml  Net            -1056 ml   Filed Weights   03/30/16 1331 03/30/16 1918  Weight: 37.6 kg (83 lb) 34.8 kg (76 lb 12.8 oz)    Exam:   General:  Underweight, frail and in no distress. Currently afebrile and feeling better/breathing easier. Today is back to baseline oxygen supplementation and is able to speak in full sentences.    Cardiovascular: S1 and S2, no rubs, no gallops  Respiratory: scattered rhonchi, no crackles and improved air moveemnt, no frank wheezing. No using accessory muscles.  Abdomen: soft, NT, positive BS  Musculoskeletal: no edema or cyanosis   Data Reviewed: Basic Metabolic Panel:  Recent Labs Lab 03/27/16 0542 03/28/16 0431 03/30/16 1400 04/01/16 0858  NA 129* 130* 129* 127*  K 4.2 3.8 3.7 4.7  CL 87* 85* 87* 86*  CO2 36* 33* 32 33*  GLUCOSE 63* 89 134* 134*  BUN '15 10 14 16  '$ CREATININE 0.46 0.55 0.60 0.61  CALCIUM 9.0 8.2* 8.6* 8.8*   Liver Function Tests:  Recent Labs Lab 03/27/16 0542 03/28/16 0431  AST 23 20  ALT 14 15  ALKPHOS 88  69  BILITOT 0.1* 0.1*  PROT 5.8* 5.4*  ALBUMIN 2.7* 2.6*   CBC:  Recent Labs Lab 03/27/16 0542 03/28/16 0431 03/30/16 1400 04/01/16 0858  WBC 25.9* 12.0* 13.3* 13.6*  NEUTROABS  --   --  10.4*  --   HGB 10.1* 10.2* 12.5 10.1*  HCT 31.3* 31.8* 36.6 31.7*  MCV 89.4 89.6 86.5 89.0  PLT 462* 502* 800* 710*   Cardiac Enzymes:  Recent Labs Lab 03/30/16 1400 03/31/16 0024 03/31/16 0543 03/31/16 1156  TROPONINI <0.03 <0.03 <0.03 <0.03   BNP (last 3 results)  Recent Labs  03/24/16 2215  03/30/16 1400  BNP 140.4* 54.3   CBG:  Recent Labs Lab 03/27/16 1618 03/27/16 2038 03/28/16 0750 03/28/16 1200 03/28/16 1622  GLUCAP 170* 85 79 124* 207*    Recent Results (from the past 240 hour(s))  Blood culture (routine x 2)     Status: None   Collection Time: 03/24/16 10:05 PM  Result Value Ref Range Status   Specimen Description BLOOD LEFT FOREARM  Final   Special Requests   Final    BOTTLES DRAWN AEROBIC AND ANAEROBIC AER 2.5cc ANA 2.5cc   Culture   Final    NO GROWTH 5 DAYS Performed at [facility]    Report Status 03/30/2016 FINAL  Final  Blood culture (routine x 2)     Status: None   Collection Time: 03/24/16 10:05 PM  Result Value Ref Range Status   Specimen Description BLOOD RIGHT ANTECUBITAL  Final   Special Requests   Final    BOTTLES DRAWN AEROBIC AND ANAEROBIC AER 5cC ANA 5cc   Culture   Final    NO GROWTH 5 DAYS Performed at [facility]    Report Status 03/30/2016 FINAL  Final  Culture, blood (routine x 2) Call MD if unable to obtain prior to antibiotics being given     Status: None   Collection Time: 03/25/16  3:40 AM  Result Value Ref Range Status   Specimen Description BLOOD LEFT ANTECUBITAL  Final   Special Requests BOTTLES DRAWN AEROBIC AND ANAEROBIC 5CC EA  Final   Culture NO GROWTH 5 DAYS  Final   Report Status 03/30/2016 FINAL  Final  Culture, blood (routine x 2) Call MD if unable to obtain prior to antibiotics being given     Status: None   Collection Time: 03/25/16  3:45 AM  Result Value Ref Range Status   Specimen Description BLOOD LEFT ARM  Final   Special Requests IN PEDIATRIC BOTTLE 3CC  Final   Culture NO GROWTH 5 DAYS  Final   Report Status 03/30/2016 FINAL  Final  Urine culture     Status: Abnormal   Collection Time: 03/30/16  4:35 PM  Result Value Ref Range Status   Specimen Description URINE, RANDOM  Final   Special Requests NONE  Final   Culture (A)  Final    7,000 COLONIES/mL INSIGNIFICANT  GROWTH Performed at [facility]    Report Status 04/01/2016 FINAL  Final  Culture, blood (routine x 2) Call MD if unable to obtain prior to antibiotics being given     Status: None (Preliminary result)   Collection Time: 03/30/16  8:54 PM  Result Value Ref Range Status   Specimen Description BLOOD RIGHT ANTECUBITAL  Final   Special Requests BOTTLES DRAWN AEROBIC AND ANAEROBIC 10CC  Final   Culture NO GROWTH 3 DAYS  Final   Report Status PENDING  Incomplete  Culture, blood (routine  x 2) Call MD if unable to obtain prior to antibiotics being given     Status: None (Preliminary result)   Collection Time: 03/30/16  9:02 PM  Result Value Ref Range Status   Specimen Description BLOOD RIGHT FOREARM  Final   Special Requests BOTTLES DRAWN AEROBIC AND ANAEROBIC 10CC  Final   Culture NO GROWTH 3 DAYS  Final   Report Status PENDING  Incomplete     Studies: No results found.  Scheduled Meds: . albuterol  2.5 mg Nebulization TID  . amLODipine  5 mg Oral Daily  . aspirin EC  81 mg Oral Daily  . budesonide (PULMICORT) nebulizer solution  0.25 mg Nebulization BID  . dicyclomine  20 mg Oral TID AC & HS  . docusate sodium  100 mg Oral BID  . doxycycline  100 mg Oral Q12H  . enoxaparin (LOVENOX) injection  30 mg Subcutaneous Q24H  . guaiFENesin  600 mg Oral BID  . lactose free nutrition  237 mL Oral TID BM  . linaclotide  290 mcg Oral Daily  . losartan  100 mg Oral Daily  . magnesium oxide  400 mg Oral Daily  . metoprolol succinate  50 mg Oral Daily  . multivitamin with minerals  1 tablet Oral Daily  . pantoprazole  80 mg Oral Daily  . PARoxetine  20 mg Oral Daily  . polyethylene glycol  17 g Oral Daily  . potassium chloride  10 mEq Oral Daily  . [START ON 04/03/2016] predniSONE  50 mg Oral Q breakfast  . umeclidinium bromide  1 puff Inhalation q morning - 10a   Continuous Infusions:   Principal Problem:   HCAP (healthcare-associated pneumonia) Active Problems:   Acute on  chronic respiratory failure with hypoxemia (HCC)   COPD exacerbation ([place])   Essential hypertension    Time spent: 30 minutes    Barton Dubois  [facility] Pager [phone]. If 7PM-7AM, please contact night-coverage at [email or web address], password [facility] 04/02/2016, 5:57 PM  LOS: 3 days

## 2016-04-02 NOTE — Progress Notes (Signed)
[facility]  Patient Status: Active (receiving services up to time of hospitalization)  [facility] is providing the following services: RN, PT and OT  If patient discharges after hours, please call [phone].   Tamara Adams 04/02/2016, 10:30 AM

## 2016-04-02 NOTE — Evaluation (Addendum)
Physical Therapy Evaluation Patient Details Name: Kristia Jupiter MRN: [id number] DOB: 05/08/1941 Today's Date: 04/02/2016   History of Present Illness  Chelcey Schwark is a 75 y.o. female with medical history significant of COPD 4L O2 at home at baseline.  Patient was just in the hospital from 9/2-9/6 with CAP and COPD exacerbation.  Patient admitted with increased cough, congestion and SOB.  Clinical Impression  Patient presents with decreased independence with mobility due to deficits listed in PT problem list.  She will benefit from skilled PT in the acute setting to allow d/c home with family support and follow up HHPT.     Follow Up Recommendations Home health PT    Equipment Recommendations  Rolling walker with 5" wheels (rollator)    Recommendations for Other Services       Precautions / Restrictions Precautions Precautions: Fall Precaution Comments: On O2 at 4 L/min Restrictions Weight Bearing Restrictions: No      Mobility  Bed Mobility Overal bed mobility: Modified Independent             General bed mobility comments: HOB up  Transfers Overall transfer level: Needs assistance Equipment used: Rolling walker (2 wheeled) Transfers: Sit to/from Stand Sit to Stand: Supervision         General transfer comment: Supervision for safety and O2 line management.  Ambulation/Gait Ambulation/Gait assistance: Supervision Ambulation Distance (Feet): 200 Feet Assistive device: Rolling walker (2 wheeled) Gait Pattern/deviations: Step-through pattern;Decreased stride length     General Gait Details: slow pace, just s/p steriod medication, patient surprised she could go so far  Science writer    Modified Rankin (Stroke Patients Only)       Balance Overall balance assessment: Needs assistance Sitting-balance support: No upper extremity supported;Feet supported Sitting balance-Leahy Scale: Good     Standing balance support: No upper  extremity supported;During functional activity Standing balance-Leahy Scale: Fair Standing balance comment: able to ambulate short distances with no AD                             Pertinent Vitals/Pain Pain Assessment: Faces Pain Score: 5  Faces Pain Scale: Hurts a little bit Pain Location: abdomen Pain Descriptors / Indicators: Aching Pain Intervention(s): Limited activity within patient's tolerance;Monitored during session;Premedicated before session;Repositioned    Home Living Family/patient expects to be discharged to:: Private residence Living Arrangements: Spouse/significant other;Children;Other relatives Available Help at Discharge: Family;Available 24 hours/day Type of Home: House Home Access: Level entry     Home Layout: One level Home Equipment: Walker - 2 wheels;Shower seat;Grab bars - tub/shower;Hand held shower head;Other (comment) (home O2) Additional Comments: walker is her husband's who is 6'5"    Prior Function Level of Independence: Needs assistance   Gait / Transfers Assistance Needed: Independent  ADL's / Homemaking Assistance Needed: Independent with ADLs. Family assists with transportation, meal prep, housekeeping and caregiving for pt's husband who had a stroke  Comments: Does not use AD, home level mobility, daughter drives and completes errands      Hand Dominance   Dominant Hand: Right    Extremity/Trunk Assessment   Upper Extremity Assessment: Overall WFL for tasks assessed           Lower Extremity Assessment: Defer to PT evaluation      Cervical / Trunk Assessment: Kyphotic  Communication   Communication: No difficulties  Cognition Arousal/Alertness: Awake/alert Behavior During Therapy:  WFL for tasks assessed/performed Overall Cognitive Status: Within Functional Limits for tasks assessed                      General Comments      Exercises        Assessment/Plan    PT Assessment Patient needs  continued PT services  PT Diagnosis Difficulty walking;Generalized weakness   PT Problem List Decreased activity tolerance;Decreased strength;Decreased mobility;Decreased balance;Decreased knowledge of use of DME;Decreased safety awareness;Cardiopulmonary status limiting activity  PT Treatment Interventions DME instruction;Gait training;Functional mobility training;Balance training;Therapeutic exercise;Therapeutic activities;Patient/family education   PT Goals (Current goals can be found in the Care Plan section) Acute Rehab PT Goals Patient Stated Goal: To go home PT Goal Formulation: With patient Time For Goal Achievement: 04/09/16 Potential to Achieve Goals: Good    Frequency Min 3X/week   Barriers to discharge        Co-evaluation               End of Session Equipment Utilized During Treatment: Gait belt;Oxygen Activity Tolerance: Patient tolerated treatment well Patient left: in bed;with call bell/phone within reach;with bed alarm set           Time: 1334-1406 PT Time Calculation (min) (ACUTE ONLY): 32 min   Charges:   PT Evaluation $PT Eval Moderate Complexity: 1 Procedure PT Treatments $Gait Training: 8-22 mins   PT G CodesReginia Naas Apr 16, 2016, 4:22 PM  Magda Kiel, [place] April 16, 2016

## 2016-04-02 NOTE — Evaluation (Signed)
Occupational Therapy Evaluation Patient Details Name: Tamara Adams MRN: [id number] DOB: October 08, 1940 Today's Date: 04/02/2016    History of Present Illness Tamara Adams is a 74 y.o. female with medical history significant of COPD 4L O2 at home at baseline.  Patient was just in the hospital from 9/2-9/6 with CAP and COPD exacerbation.  Patient admitted with increased cough, congestion and SOB.   Clinical Impression   PTA, pt was independent with ADLs and mobility and uses 4.5L of supplemental O2 at home. Pt currently requires supervision for ADLs and basic transfers due to generalized weakness and O2 line management. Reviewed energy conservation strategies that the pt is already implementing at home and educated pt on several more. Pt plans to d/c home with 24/7 assistance from her family. Pt will benefit from continued acute OT to increase independence and safety with ADLs and mobility to allow for safe discharge home. Highly recommend a rollator for home and community use. Will continue to follow acutely.    Follow Up Recommendations  No OT follow up;Supervision - Intermittent    Equipment Recommendations  Other (comment) (Rollator)    Recommendations for Other Services       Precautions / Restrictions Precautions Precautions: Fall Precaution Comments: On O2 at 4 L/min Restrictions Weight Bearing Restrictions: No      Mobility Bed Mobility Overal bed mobility: Modified Independent             General bed mobility comments: HOB up  Transfers Overall transfer level: Needs assistance Equipment used: Rolling walker (2 wheeled) Transfers: Sit to/from Stand Sit to Stand: Supervision         General transfer comment: Supervision for safety and O2 line management.    Balance Overall balance assessment: Needs assistance Sitting-balance support: No upper extremity supported;Feet supported Sitting balance-Leahy Scale: Good     Standing balance support: No upper extremity  supported;During functional activity Standing balance-Leahy Scale: Fair Standing balance comment: able to ambulate short distances with no AD                            ADL Overall ADL's : Needs assistance/impaired Eating/Feeding: Supervision/ safety;Sitting   Grooming: Wash/dry hands;Wash/dry face;Oral care;Supervision/safety;Standing   Upper Body Bathing: Supervision/ safety;Sitting   Lower Body Bathing: Supervison/ safety;Sit to/from stand Lower Body Bathing Details (indicate cue type and reason): educated on use of long-handled sponge         Toilet Transfer: Supervision/safety;Ambulation;Regular Toilet   Toileting- Water quality scientist and Hygiene: Supervision/safety;Sit to/from stand       Functional mobility during ADLs: Supervision/safety General ADL Comments: Reviewed energy conservation strategies that pt is already implementing at home. Pt does not take hot showers, wears O2 in shower, sits when showering, breaks up tasks like bathing/dressing so that she is not completing too much at one time, and takes frequent breaks.     Vision Vision Assessment?: No apparent visual deficits   Perception     Praxis      Pertinent Vitals/Pain Pain Assessment: Faces Pain Score: 5  Faces Pain Scale: Hurts a little bit Pain Location: abdomen Pain Descriptors / Indicators: Aching Pain Intervention(s): Limited activity within patient's tolerance;Monitored during session;Premedicated before session;Repositioned     Hand Dominance Right   Extremity/Trunk Assessment Upper Extremity Assessment Upper Extremity Assessment: Overall WFL for tasks assessed   Lower Extremity Assessment Lower Extremity Assessment: Defer to PT evaluation   Cervical / Trunk Assessment Cervical / Trunk Assessment: Kyphotic  Communication Communication Communication: No difficulties   Cognition Arousal/Alertness: Awake/alert Behavior During Therapy: WFL for tasks  assessed/performed Overall Cognitive Status: Within Functional Limits for tasks assessed                     General Comments       Exercises       Shoulder Instructions      Home Living Family/patient expects to be discharged to:: Private residence Living Arrangements: Spouse/significant other;Children;Other relatives Available Help at Discharge: Family;Available 24 hours/day Type of Home: House Home Access: Level entry     Home Layout: One level     Bathroom Shower/Tub: Tub/shower unit Shower/tub characteristics: Curtain Biochemist, clinical: Handicapped height     Home Equipment: Environmental consultant - 2 wheels;Shower seat;Grab bars - tub/shower;Hand held shower head;Other (comment) (home O2)   Additional Comments: walker is her husband's who is 6'5"      Prior Functioning/Environment Level of Independence: Needs assistance  Gait / Transfers Assistance Needed: Independent ADL's / Homemaking Assistance Needed: Independent with ADLs. Family assists with transportation, meal prep, housekeeping and caregiving for pt's husband who had a stroke   Comments: Does not use AD, home level mobility, daughter drives and completes errands     OT Diagnosis: Generalized weakness;Acute pain   OT Problem List: Decreased activity tolerance;Impaired balance (sitting and/or standing);Pain;Cardiopulmonary status limiting activity;Decreased safety awareness;Decreased knowledge of use of DME or AE   OT Treatment/Interventions: Self-care/ADL training;Therapeutic exercise;Energy conservation;DME and/or AE instruction;Therapeutic activities;Patient/family education;Balance training    OT Goals(Current goals can be found in the care plan section) Acute Rehab OT Goals Patient Stated Goal: To go home OT Goal Formulation: With patient Time For Goal Achievement: 04/16/16 Potential to Achieve Goals: Good ADL Goals Pt Will Perform Upper Body Bathing: with modified independence;sitting Pt Will Perform  Lower Body Bathing: with modified independence Pt Will Transfer to Toilet: with modified independence;ambulating;regular height toilet Pt Will Perform Toileting - Clothing Manipulation and hygiene: with modified independence;sit to/from stand Additional ADL Goal #1: Pt will verbalize 3 new energy conservation strategies to increase independence with ADL and/or IADL tasks.  OT Frequency: Min 2X/week   Barriers to D/C:            Co-evaluation              End of Session Equipment Utilized During Treatment: Gait belt;Oxygen Nurse Communication: Mobility status  Activity Tolerance: Patient tolerated treatment well Patient left: in bed;with call bell/phone within reach;with bed alarm set   Time: 9355-2174 OT Time Calculation (min): 17 min Charges:  OT General Charges $OT Visit: 1 Procedure OT Evaluation $OT Eval Moderate Complexity: 1 Procedure G-Codes:    Redmond Baseman, OTR/L Pager: [phone] 04/02/2016, 4:08 PM

## 2016-04-02 NOTE — Consult Note (Addendum)
   [facility] CM Inpatient Consult   04/02/2016  Nakeshia Waldeck 03-23-1941 [id number]    Mrs. Delcastillo is active with [place] Management program. Made aware of hospital admission by [facility] NP. Also [facility] NP suggests that Mrs. Conran could benefit from a goals of care consult while in hospital due to co-morbidites, frequent admissions, and overall decline. Spoke with Dr. Dyann Kief about possible goals of care consult being ordered while in hospital. Dr. Dyann Kief states from his conversation with patient, she appears to want full scope of care and that goals of care consult meeting will not likely reveal anything different at this time. States continued discussions should continue as an outpatient as well.  Therefore, goals of care consult will not be ordered at this time. Spoke with inpatient RNCM about the above and sent notification to [facility] NP with update.  Spoke with Mrs. Gouger at bedside. She was resting. Made her aware that [facility] NP will continue to follow post discharge. She states she was supposed to have a home visit scheduled for tomorrow. Made her aware that [facility] NP is aware of her hospital admission and visit can be rescheduled for later time. Contact information provided. Appreciative of visit.   Please see chart review then notes for further community patient outreach details from [facility] NP.   Marthenia Rolling, MSN-Ed, RN,BSN [facility] Liaison [phone]

## 2016-04-02 NOTE — Plan of Care (Signed)
Problem: Activity: Goal: Risk for activity intolerance will decrease Outcome: Progressing One assist to [facility]

## 2016-04-02 NOTE — Care Management Important Message (Signed)
Important Message  Patient Details  Name: Tamara Adams MRN: [id number] Date of Birth: January 08, 1941   Medicare Important Message Given:  Yes    Charlette Hennings [address] 04/02/2016, 2:33 PM

## 2016-04-03 DIAGNOSIS — J9620 Acute and chronic respiratory failure, unspecified whether with hypoxia or hypercapnia: Secondary | ICD-10-CM

## 2016-04-03 DIAGNOSIS — R5381 Other malaise: Secondary | ICD-10-CM

## 2016-04-03 DIAGNOSIS — E871 Hypo-osmolality and hyponatremia: Secondary | ICD-10-CM

## 2016-04-03 MED ORDER — DOXYCYCLINE HYCLATE 100 MG PO TABS: 100.0000 mg | ORAL_TABLET | Freq: Two times a day (BID) | ORAL | 0 refills | Status: AC

## 2016-04-03 MED ORDER — PREDNISONE 20 MG PO TABS: ORAL_TABLET | ORAL | 0 refills | Status: DC

## 2016-04-03 MED ORDER — GUAIFENESIN ER 600 MG PO TB12: 600.0000 mg | ORAL_TABLET | Freq: Two times a day (BID) | ORAL | 0 refills | Status: AC

## 2016-04-03 MED ORDER — OMEPRAZOLE 40 MG PO CPDR: 40.0000 mg | DELAYED_RELEASE_CAPSULE | Freq: Two times a day (BID) | ORAL | 3 refills | Status: DC

## 2016-04-03 NOTE — Plan of Care (Signed)
Problem: Education: Goal: Knowledge of  General Education information/materials will improve Outcome: Completed/Met Date Met: 04/03/16 Pt educated throughout entire admission regarding tests procedures, medications, and available resources.   Problem: Safety: Goal: Ability to remain free from injury will improve Outcome: Completed/Met Date Met: 04/03/16 Pt has remained free from injury   Problem: Fluid Volume: Goal: Ability to maintain a balanced intake and output will improve Outcome: Completed/Met Date Met: 04/03/16 Pt has adequate intake and output   Problem: Nutrition: Goal: Adequate nutrition will be maintained Outcome: Completed/Met Date Met: 04/03/16 Pt has adequate nutrition   Problem: Bowel/Gastric: Goal: Will not experience complications related to bowel motility Outcome: Completed/Met Date Met: 04/03/16 Pt has not experienced any complications related to bowel motility

## 2016-04-03 NOTE — Discharge Summary (Signed)
Physician Discharge Summary  Tamara Adams LGX:[id number] DOB: [birth year] DOA: 03/30/2016  PCP: Beatrice Lecher, MD  Admit date: 03/30/2016 Discharge date: 04/03/2016  Time spent: 35 minutes  Recommendations for Outpatient Follow-up:  1. Repeat BMET to follow up electrolytes and renal function  2. Repeat CXR in 3-4 weeks to assure resolution of infiltrates    Discharge Diagnoses:  Principal Problem:   HCAP (healthcare-associated pneumonia) Active Problems:   Acute on chronic respiratory failure with hypoxemia (HCC)   COPD exacerbation (HCC)   Essential hypertension   Physical deconditioning hyponatremia    Discharge Condition: will discharge home in stable and improved condition. Patient to follow up with PCP in 10 days and with pulmonary service in 3 weeks.  Diet recommendation: heart healthy diet  Filed Weights   03/30/16 1331 03/30/16 1918  Weight: 37.6 kg (83 lb) 34.8 kg (76 lb 12.8 oz)    History of present illness:  75 y.o.femalewith medical history significant of COPD 3-4L O2 at home at baseline. Patient was just in the hospital from 9/2-9/6 with CAP and COPD exacerbation. Despite treatment with rocephin / azithromycin and discharge on zithromax and amoxicillin to complete therapy; patients symptoms have become worse at home. She has increased cough, wheezing, SOB. Has al noticed increase O2 at home up to 6L. No fever, chills, N/V/D, rash, dysuria. Patient returns to ED for worsening symptoms. CXR demonstrated worsening infiltrates.  Hospital Course:  1-acute on chronic resp failure with hypoxia: due to HCAP and COPD exacerbation -transitioned to doxycycline BID at discharge to complete antibiotic therapy -continue nebulizer treatments, home inhaler regimen and oxygen supplementation (now at baseline rate).  -will discharge with prednisone tapering and encourage to continue flutter valve use  -will follow culture data; so far no growth up to date  2-essential  HTN: -stable -will continue home antihypertensive regimen   3-severe protein calorie malnutrition  -will follow nutritional service rec's -will use feeding supplements (Boost or Ensure plus) TID  4-GERD -continue PPI  5-depression/anxiety -will continue paxil and PRN ativan   6-physical deconditioning  -will arrange fo [facility] services at discharge ([place] [facility] [place]) -follow assessment rec's will request for DME rollator   7-hyponatremia: -most likely secondary to dehydration/poor nutrition and PNA -IVF's given and started on TID feeding supplements -patient advise to improve nutrition   Procedures:  See below for x-ray reports   Consultations:  None   Discharge Exam: Vitals:   04/02/16 2107 04/03/16 0536  BP: (!) 119/54 (!) 144/69  Pulse: 66 68  Resp: 18 18  Temp: 98.6 F (37 C) 99.1 F (37.3 C)    General:  Underweight, frail and in no distress. Currently afebrile and feeling better/breathing a lot easier. Back to her baseline oxygen supplementation and able to speak in full sentences at discharge.    Cardiovascular: S1 and S2, no rubs, no gallops  Respiratory: scattered rhonchi, no crackles and improved air moveemnt, no frank wheezing. No using accessory muscles.  Abdomen: soft, NT, positive BS  Musculoskeletal: no edema or cyanosis   Discharge Instructions   Discharge Instructions    Diet - low sodium heart healthy    Complete by:  As directed   Discharge instructions    Complete by:  As directed   Take medications as prescribed Keep yourself well hydrated Please arrange follow up with PCP in 10 days Please arrange follow up with pulmonology service in 3 weeks     Current Discharge Medication List    START taking these medications  Details  doxycycline (VIBRA-TABS) 100 MG tablet Take 1 tablet (100 mg total) by mouth every 12 (twelve) hours. Qty: 10 tablet, Refills: 0    guaiFENesin (MUCINEX) 600 MG 12 hr tablet Take 1 tablet (600 mg total) by  mouth 2 (two) times daily. Qty: 30 tablet, Refills: 0    predniSONE (DELTASONE) 20 MG tablet Please take 2 tablets by mouth daily X 2 days; then 1 tablet by mouth daily X 3 days; then 1/2 tablet by mouth daily X 3 days and stop prednisone Qty: 10 tablet, Refills: 0      CONTINUE these medications which have CHANGED   Details  omeprazole (PRILOSEC) 40 MG capsule Take 1 capsule (40 mg total) by mouth 2 (two) times daily. Qty: 60 capsule, Refills: 3      CONTINUE these medications which have NOT CHANGED   Details  acetaminophen (TYLENOL) 325 MG tablet Take 325-650 mg by mouth every 6 (six) hours as needed for headache.    albuterol (PROVENTIL) (2.5 MG/3ML) 0.083% nebulizer solution USE 1 VIAL (3 ML) VIA NEBULIZER EVERY 6 HOURS AS NEEDED FOR WHEEZING OR SHORTNESS OF BREATH Qty: 810 mL, Refills: 12    amLODipine (NORVASC) 5 MG tablet Take 1 tablet (5 mg total) by mouth daily. Qty: 90 tablet, Refills: 0    aspirin EC 81 MG tablet Take 81 mg by mouth daily.    dicyclomine (BENTYL) 20 MG tablet Take 1 tablet (20 mg total) by mouth 4 (four) times daily -  before meals and at bedtime. Qty: 360 tablet, Refills: 1   Associated Diagnoses: Chronic abdominal pain    ENSURE PLUS (ENSURE PLUS) LIQD Take 237 mLs by mouth See admin instructions. Two to three times a day to cause weight GAIN    fluticasone furoate-vilanterol (BREO ELLIPTA) 100-25 MCG/INH AEPB USE 1 INHALATION DAILY Qty: 180 each, Refills: 2    HYDROcodone-acetaminophen (NORCO) 10-325 MG tablet Take 1 tablet by mouth every 6 (six) hours as needed. Qty: 60 tablet, Refills: 0    KLOR-CON M10 10 MEQ tablet Take 1 tablet (10 mEq total) by mouth daily. Qty: 90 tablet, Refills: 2    Linaclotide (LINZESS) 290 MCG CAPS capsule Take 1 capsule (290 mcg total) by mouth daily. Qty: 90 capsule, Refills: 4    LORazepam (ATIVAN) 1 MG tablet Take 1 tablet (1 mg total) by mouth daily as needed for anxiety or sleep. Qty: 90 tablet, Refills: 1     losartan (COZAAR) 100 MG tablet Take 1 tablet (100 mg total) by mouth daily. Qty: 90 tablet, Refills: 1    magnesium oxide (MAG-OX) 400 (241.3 Mg) MG tablet Take 1 tablet (400 mg total) by mouth daily. Qty: 30 tablet, Refills: 0    metoprolol succinate (TOPROL-XL) 50 MG 24 hr tablet Take 1 tablet (50 mg total) by mouth daily. Qty: 90 tablet, Refills: 1    Multiple Vitamin (MULTIVITAMIN WITH MINERALS) TABS tablet Take 1 tablet by mouth daily. Pt uses One-A-Day brand    nicotine (NICOTROL) 10 MG inhaler Inhale 10 mg into the lungs 2 (two) times daily as needed for smoking cessation.     ondansetron (ZOFRAN-ODT) 8 MG disintegrating tablet TAKE 1 TABLET EVERY 8 HOURS AS NEEDED FOR NAUSEA Qty: 90 tablet, Refills: 1    OXYGEN Inhale 4 L into the lungs continuous.    PARoxetine (PAXIL) 20 MG tablet Take 1 tablet (20 mg total) by mouth daily. Qty: 90 tablet, Refills: 0    PROAIR HFA 108 (90 Base) MCG/ACT  inhaler USE 2 TO 4 INHALATIONS EVERY 4 TO 6 HOURS AS NEEDED Qty: 25.5 g, Refills: 1    promethazine (PHENERGAN) 25 MG tablet Take 12.5 mg by mouth every 8 (eight) hours as needed for nausea.     traMADol (ULTRAM) 50 MG tablet Take 1 tablet (50 mg total) by mouth every 6 (six) hours as needed (pain). Qty: 90 tablet, Refills: 3    Umeclidinium Bromide (INCRUSE ELLIPTA) 62.5 MCG/INH AEPB Inhale 1 Inhaler into the lungs daily. Qty: 90 each, Refills: PRN       Allergies  Allergen Reactions  . Maxidex [Dexamethasone] Other (See Comments)    DEHYDRATION AND HEART RACING  . Vicodin [Hydrocodone-Acetaminophen] Other (See Comments)    Hallucinations  . Advair Diskus [Fluticasone-Salmeterol] Other (See Comments)    No benefit with lungs (INEFFECTIVE)  . Remeron [Mirtazapine] Other (See Comments)    CAUSED NIGHTMARES  . Trazodone And Nefazodone Other (See Comments)    Heart pounding  . Tylox [Oxycodone-Acetaminophen] Itching   Follow-up [facility] .    Why:  rolling walker with seat to be deliver to room prior to discharge Contact information: [address] [place] [address] [phone]        [facility] .   Why:  Home health RN,PT  arranged Contact information: [address] [place] [address] [phone]        METHENEY,CATHERINE, MD. Schedule an appointment as soon as possible for a visit in 10 day(s).   Specialty:  Family Medicine Contact information: [address] [place] [address] [phone]           The results of significant diagnostics from this hospitalization (including imaging, microbiology, ancillary and laboratory) are listed below for reference.    Significant Diagnostic Studies: Dg Chest 2 View  Result Date: 03/30/2016 CLINICAL DATA:  Shortness of breath productive cough. Chronic lung disease. EXAM: CHEST  2 VIEW COMPARISON:  03/28/2016.  03/24/2016. FINDINGS: The heart size is normal. There is aortic atherosclerosis. Lungs are markedly hyperinflated consistent with emphysema. Infiltrate at the right lung base may have worsened slightly. Small amount of pleural fluid in the posterior costophrenic angles. No acute bone finding. IMPRESSION: Chronic emphysema with hyperinflation. Persistent and probably slightly worsened right lung base pneumonia. Small amount of pleural fluid in the posterior costophrenic angles. Electronically Signed   By: Nelson Chimes M.D.   On: 03/30/2016 14:42   Dg Chest 2 View  Result Date: 03/28/2016 CLINICAL DATA:  Chronic shortness of breath, history of COPD, hypertension, sepsis. Former smoker. EXAM: CHEST  2 VIEW COMPARISON:  Chest x-ray of March 24, 2016 and chest CT scan of March 25, 2016. FINDINGS: The lungs remain markedly hyperinflated with hemidiaphragm flattening and increased AP dimension of the thorax. The lower lobe interstitial markings are less conspicuous today. Chronic fibrotic changes persist  however. There is no pleural effusion or pneumothorax. The heart and pulmonary vascularity are normal. There is calcification within the wall of the aortic arch. There is mild multilevel degenerative disc disease of the thoracic spine. IMPRESSION: COPD. Decreased interstitial infiltrate in the lower lobes consistent with response to therapy. No CHF. Aortic atherosclerosis. Electronically Signed   By: David  Martinique M.D.   On: 03/28/2016 08:00   Dg Chest 2 View  Result Date: 03/24/2016 CLINICAL DATA:  Acute onset of cough and intermittent fever. Initial encounter. EXAM: CHEST  2 VIEW COMPARISON:  Chest  radiograph performed 06/23/2015 FINDINGS: The lungs are hyperexpanded. Peribronchial thickening is noted, with increased interstitial markings, concerning for acute atypical infection superimposed on the patient's COPD. No definite pleural effusion or pneumothorax is seen. The heart is normal in size; the mediastinal contour is within normal limits. No acute osseous abnormalities are seen. IMPRESSION: Peribronchial thickening, with increased interstitial markings, concerning for acute atypical infection superimposed on the patient's COPD. Electronically Signed   By: Garald Balding M.D.   On: 03/24/2016 21:49   Ct Angio Chest Pe W Or Wo Contrast  Result Date: 03/25/2016 CLINICAL DATA:  Worsening cough, shortness breath, and hypoxia for past 10 days. COPD. EXAM: CT ANGIOGRAPHY CHEST WITH CONTRAST TECHNIQUE: Multidetector CT imaging of the chest was performed using the standard protocol during bolus administration of intravenous contrast. Multiplanar CT image reconstructions and MIPs were obtained to evaluate the vascular anatomy. CONTRAST:  100 mL Isovue 370 COMPARISON:  01/11/2015 FINDINGS: Cardiovascular: Satisfactory opacification of pulmonary arteries noted, and no pulmonary emboli identified. No evidence of thoracic aortic dissection or aneurysm. Aortic atherosclerosis. Normal heart size. No evidence  pericardial effusion . Mediastinum/Lymph Nodes: No masses or pathologically enlarged lymph nodes identified. Lungs/Pleura: Severe centrilobular emphysema again noted. New airspace disease is seen in both lower lobes, right greater than left, suspicious for pneumonia. Cluster of new solid-appearing nodular densities are seen in the inferior aspect of right middle lobe, largest measuring 8 mm on image 123/8. These are likely infectious or inflammatory in etiology, with neoplasm considered less likely. No evidence of pleural effusion. Upper abdomen: No acute findings. Musculoskeletal: No chest wall mass or suspicious bone lesions identified. Review of the MIP images confirms the above findings. IMPRESSION: No evidence of pulmonary embolism. New bilateral lower lobe airspace disease, right side greater than left, suspicious for pneumonia. Clustered sub-cm nodular densities seen in inferior right middle lobe, largest measuring 8 mm, also likely infectious or inflammatory in etiology. Recommend follow-up chest CT in 2-3 months to confirm resolution. Severe centrilobular emphysema. Aortic atherosclerosis. Electronically Signed   By: Earle Gell M.D.   On: 03/25/2016 13:32   Dg Abd Portable 1v  Result Date: 03/25/2016 CLINICAL DATA:  Abdominal pain and constipation. EXAM: PORTABLE ABDOMEN - 1 VIEW COMPARISON:  12/05/2015 FINDINGS: Moderate stool in the colon without evidence of bowel obstruction or significant ileus. No free air. No abnormal calcifications. Clips present from prior cholecystectomy. Spondylosis and scoliosis of the lumbar spine present. IMPRESSION: Moderate fecal material.  No evidence of bowel obstruction. Electronically Signed   By: Aletta Edouard M.D.   On: 03/25/2016 08:13    Microbiology: Recent Results (from the past 240 hour(s))  Blood culture (routine x 2)     Status: None   Collection Time: 03/24/16 10:05 PM  Result Value Ref Range Status   Specimen Description BLOOD LEFT FOREARM  Final    Special Requests   Final    BOTTLES DRAWN AEROBIC AND ANAEROBIC AER 2.5cc ANA 2.5cc   Culture   Final    NO GROWTH 5 DAYS Performed at [facility]    Report Status 03/30/2016 FINAL  Final  Blood culture (routine x 2)     Status: None   Collection Time: 03/24/16 10:05 PM  Result Value Ref Range Status   Specimen Description BLOOD RIGHT ANTECUBITAL  Final   Special Requests   Final    BOTTLES DRAWN AEROBIC AND ANAEROBIC AER 5cC ANA 5cc   Culture   Final    NO GROWTH 5 DAYS Performed  at [facility]    Report Status 03/30/2016 FINAL  Final  Culture, blood (routine x 2) Call MD if unable to obtain prior to antibiotics being given     Status: None   Collection Time: 03/25/16  3:40 AM  Result Value Ref Range Status   Specimen Description BLOOD LEFT ANTECUBITAL  Final   Special Requests BOTTLES DRAWN AEROBIC AND ANAEROBIC 5CC EA  Final   Culture NO GROWTH 5 DAYS  Final   Report Status 03/30/2016 FINAL  Final  Culture, blood (routine x 2) Call MD if unable to obtain prior to antibiotics being given     Status: None   Collection Time: 03/25/16  3:45 AM  Result Value Ref Range Status   Specimen Description BLOOD LEFT ARM  Final   Special Requests IN PEDIATRIC BOTTLE 3CC  Final   Culture NO GROWTH 5 DAYS  Final   Report Status 03/30/2016 FINAL  Final  Urine culture     Status: Abnormal   Collection Time: 03/30/16  4:35 PM  Result Value Ref Range Status   Specimen Description URINE, RANDOM  Final   Special Requests NONE  Final   Culture (A)  Final    7,000 COLONIES/mL INSIGNIFICANT GROWTH Performed at [facility]    Report Status 04/01/2016 FINAL  Final  Culture, blood (routine x 2) Call MD if unable to obtain prior to antibiotics being given     Status: None (Preliminary result)   Collection Time: 03/30/16  8:54 PM  Result Value Ref Range Status   Specimen Description BLOOD RIGHT ANTECUBITAL  Final   Special Requests BOTTLES DRAWN AEROBIC AND ANAEROBIC  10CC  Final   Culture NO GROWTH 4 DAYS  Final   Report Status PENDING  Incomplete  Culture, blood (routine x 2) Call MD if unable to obtain prior to antibiotics being given     Status: None (Preliminary result)   Collection Time: 03/30/16  9:02 PM  Result Value Ref Range Status   Specimen Description BLOOD RIGHT FOREARM  Final   Special Requests BOTTLES DRAWN AEROBIC AND ANAEROBIC 10CC  Final   Culture NO GROWTH 4 DAYS  Final   Report Status PENDING  Incomplete     Labs: Basic Metabolic Panel:  Recent Labs Lab 03/28/16 0431 03/30/16 1400 04/01/16 0858  NA 130* 129* 127*  K 3.8 3.7 4.7  CL 85* 87* 86*  CO2 33* 32 33*  GLUCOSE 89 134* 134*  BUN '10 14 16  '$ CREATININE 0.55 0.60 0.61  CALCIUM 8.2* 8.6* 8.8*   Liver Function Tests:  Recent Labs Lab 03/28/16 0431  AST 20  ALT 15  ALKPHOS 69  BILITOT 0.1*  PROT 5.4*  ALBUMIN 2.6*   CBC:  Recent Labs Lab 03/28/16 0431 03/30/16 1400 04/01/16 0858  WBC 12.0* 13.3* 13.6*  NEUTROABS  --  10.4*  --   HGB 10.2* 12.5 10.1*  HCT 31.8* 36.6 31.7*  MCV 89.6 86.5 89.0  PLT 502* 800* 710*   Cardiac Enzymes:  Recent Labs Lab 03/30/16 1400 03/31/16 0024 03/31/16 0543 03/31/16 1156  TROPONINI <0.03 <0.03 <0.03 <0.03   BNP: BNP (last 3 results)  Recent Labs  03/24/16 2215 03/30/16 1400  BNP 140.4* 54.3   CBG:  Recent Labs Lab 03/27/16 1618 03/27/16 2038 03/28/16 0750 03/28/16 1200 03/28/16 1622  GLUCAP 170* 85 79 124* 207*    Signed:  Barton Dubois MD.  [facility] 04/03/2016, 4:10 PM

## 2016-04-03 NOTE — Progress Notes (Signed)
Occupational Therapy Treatment/Discharge Patient Details Name: Tamara Adams MRN: [id number] DOB: 09/01/1940 Today's Date: 04/03/2016    History of present illness Tamara Adams is a 75 y.o. female with medical history significant of COPD 4L O2 at home at baseline.  Patient was just in the hospital from 9/2-9/6 with CAP and COPD exacerbation.  Patient admitted with increased cough, congestion and SOB.   OT comments  Pt progressing well and has achieved all acute OT goals. Educated pt on use of rollator during ADLs and emphasized importance of always locking brakes. Review energy conservation strategies with pt and pt's son. Pt's son reports recent incident where pt appeared confused and her BP was soft - RN was present and is discussing this with MD. Despite this, pt is doing well has no further acute OT needs. OT will sign off at this time.   Follow Up Recommendations  No OT follow up;Supervision - Intermittent    Equipment Recommendations  Other (comment) (Rollator)    Recommendations for Other Services      Precautions / Restrictions Precautions Precautions: Fall Precaution Comments: On O2 at 4 L/min Restrictions Weight Bearing Restrictions: No       Mobility Bed Mobility Overal bed mobility: Modified Independent                Transfers Overall transfer level: Modified independent Equipment used: 4-wheeled walker Transfers: Sit to/from Stand           General transfer comment: Educated on proper use of rollator including use of brakes and using seat for rest breaks.    Balance Overall balance assessment: Needs assistance Sitting-balance support: No upper extremity supported;Feet supported Sitting balance-Leahy Scale: Good     Standing balance support: No upper extremity supported;During functional activity Standing balance-Leahy Scale: Fair                     ADL Overall ADL's : Modified independent                                        General ADL Comments: Practiced using rollator during ADLs and transfers. Emphasized always locking brakes on rollator when "parking" it or when attempting to stand.       Vision                     Perception     Praxis      Cognition   Behavior During Therapy: WFL for tasks assessed/performed Overall Cognitive Status: Within Functional Limits for tasks assessed                       Extremity/Trunk Assessment               Exercises     Shoulder Instructions       General Comments      Pertinent Vitals/ Pain       Pain Assessment: No/denies pain  Home Living                                          Prior Functioning/Environment              Frequency       Progress Toward Goals  OT Goals(current goals can now be found  in the care plan section)  Progress towards OT goals: Goals met/education completed, patient discharged from OT  Acute Rehab OT Goals Patient Stated Goal: To go home OT Goal Formulation: With patient Time For Goal Achievement: 04/16/16 Potential to Achieve Goals: Good ADL Goals Pt Will Perform Upper Body Bathing: with modified independence;sitting Pt Will Perform Lower Body Bathing: with modified independence Pt Will Perform Upper Body Dressing: with modified independence;sitting Pt Will Perform Lower Body Dressing: with modified independence;with adaptive equipment;sit to/from stand Pt Will Transfer to Toilet: with modified independence;ambulating;regular height toilet Pt Will Perform Toileting - Clothing Manipulation and hygiene: with modified independence;sit to/from stand Pt Will Perform Tub/Shower Transfer: Tub transfer;with min guard assist;shower seat Additional ADL Goal #1: Pt will verbalize 3 new energy conservation strategies to increase independence with ADL and/or IADL tasks.  Plan All goals met and education completed, patient discharged from OT services     Co-evaluation                 End of Session Equipment Utilized During Treatment: Oxygen;Other (comment) (rollator)   Activity Tolerance Patient tolerated treatment well   Patient Left in bed;with call bell/phone within reach;with family/visitor present   Nurse Communication Mobility status        Time: 4730-8569 OT Time Calculation (min): 16 min  Charges: OT General Charges $OT Visit: 1 Procedure  Redmond Baseman, OTR/L Pager: [phone] 04/03/2016, 4:10 PM

## 2016-04-03 NOTE — Care Management Note (Signed)
Case Management Note  Patient Details  Name: Tamara Adams MRN: [id number] Date of Birth: February 05, 1941  Subjective/Objective:   Patient admitted with increased cough, congestion and SOB, history significant of COPD,and home. Patient was just in the hospital from 9/2-9/6 with CAP and COPD exacerbation. From home with husband/family.   PCP: Beatrice Lecher  Action/Plan:  Plan is to d/c to home with home health services (RN,PT). Expected Discharge Date:   04/03/2016              Expected Discharge Plan:  [place]  In-House Referral:     Discharge planning Services  CM Consult  Post Acute Care Choice:  Durable Medical Equipment Choice offered to:  Patient  DME Arranged:  Walker rolling with seat/ Brenton Grills [phone] DME Agency:  [place] Arranged:  RN, PT/ Butch [phone] [facility] Agency:  [place] [facility]  Status of Service:  Completed, signed off  If discussed at [place] of Stay Meetings, dates discussed:    Additional Comments:  Sharin Mons, RN 04/03/2016, 10:18 AM

## 2016-04-04 ENCOUNTER — Encounter: Payer: Self-pay | Admitting: *Deleted

## 2016-04-04 ENCOUNTER — Other Ambulatory Visit: Payer: Self-pay | Admitting: *Deleted

## 2016-04-04 LAB — CULTURE, BLOOD (ROUTINE X 2)
Culture: NO GROWTH
Culture: NO GROWTH

## 2016-04-04 NOTE — Patient Outreach (Signed)
Transition of care call #1. Pt has had back to back hospitalizations, first for COPD exacerbation and second for HCAP. She is very weak and has lost additional wt -2# which I'm surprised it is not more. She is very glad to be back home. She is getting home health services from another agency. [place] will stay in the background while home health is in, so there is not a lot of confusion. I will call pt weekly over the next month.  Dr. Madilyn Fireman: I think [facility] (Palliative care program out of [facility]) may be a good referral for Tamara Adams. She is so very frail. They are partnered with [facility] and we have established a very good relationship. I make referrals and they return the favor for folks that are not quite at the point that they are appropriate for the service.   If you feel this is appropriate would you do that after home health has discharged her. Thank you for this consideration. If [facility] would become involved then I would close her case (FYI).  [facility] CM Care Plan Problem One   Flowsheet Row Most Recent Value  Care Plan Problem One  Low weight and abdominal pain  Role Documenting the Problem One  Care Management Cole for Problem One  Active  THN Long Term Goal (31-90 days)  Pt will regain 2# over the next 31 days.  THN Long Term Goal Start Date  04/04/16  THN Long Term Goal Met Date  03/08/16  Interventions for Problem One Long Term Goal  Reinforced to eat as much high protein foods as possible and drink the ensure tid.  THN CM Short Term Goal #1 (0-30 days)  Pt will weigh weekly and record for NP.  THN CM Short Term Goal #1 Start Date  04/04/16  [facility] CM Short Term Goal #1 Met Date  01/04/16  Interventions for Short Term Goal #1  Request pt start weighing weekly.  THN CM Short Term Goal #2 (0-30 days)  Pt will weigh weekly and record over the next 30 days.  THN CM Short Term Goal #2 Start Date  12/16/15  [facility]N CM Short  Term Goal #2 Met Date  02/07/16  [facility]N CM Short Term Goal #3 (0-30 days)  Pt will drink 3 cans of ensure plus if unable to eat to eat solid food.  THN CM Short Term Goal #3 Start Date  12/16/15  [facility] CM Short Term Goal #3 Met Date  03/08/16    [facility] CM Care Plan Problem Two   Flowsheet Row Most Recent Value  Care Plan Problem Two  COPD  Role Documenting the Problem Two  Care Management Coordinator  Care Plan for Problem Two  Active  Interventions for Problem Two Long Term Goal   Reinforced getting back to following her COPD Action Plan, taking her meds, call if any problems!  THN Long Term Goal (31-90) days  Pt to follow her COPD action plan and call for problems over the next 31 days.  THN Long Term Goal Start Date  04/04/16  [facility] Long Term Goal Met Date  03/17/16  THN CM Short Term Goal #1 (0-30 days)  Pt to attend her MD appt on 04/19/16  [facility] CM Short Term Goal #1 Start Date  04/04/16  [facility] CM Short Term Goal #1 Met Date   02/07/16  Interventions for Short Term Goal #2   Verified pt follow up appt.  THN CM Short Term Goal #2 (0-30 days)  Pt will abstain from smoking and use her ecig  THN CM Short Term Goal #2 Start Date  12/16/15  Interventions for Short Term Goal #2  starting new care plan today for transitioin of care.      Deloria Lair [facility] [place] [phone]

## 2016-04-05 ENCOUNTER — Ambulatory Visit: Payer: Self-pay | Admitting: *Deleted

## 2016-04-06 DIAGNOSIS — I10 Essential (primary) hypertension: Secondary | ICD-10-CM | POA: Diagnosis not present

## 2016-04-06 DIAGNOSIS — K219 Gastro-esophageal reflux disease without esophagitis: Secondary | ICD-10-CM | POA: Diagnosis not present

## 2016-04-06 DIAGNOSIS — F341 Dysthymic disorder: Secondary | ICD-10-CM | POA: Diagnosis not present

## 2016-04-06 DIAGNOSIS — Z9981 Dependence on supplemental oxygen: Secondary | ICD-10-CM | POA: Diagnosis not present

## 2016-04-06 DIAGNOSIS — J189 Pneumonia, unspecified organism: Secondary | ICD-10-CM | POA: Diagnosis not present

## 2016-04-06 DIAGNOSIS — J440 Chronic obstructive pulmonary disease with acute lower respiratory infection: Secondary | ICD-10-CM | POA: Diagnosis not present

## 2016-04-06 DIAGNOSIS — J441 Chronic obstructive pulmonary disease with (acute) exacerbation: Secondary | ICD-10-CM | POA: Diagnosis not present

## 2016-04-09 DIAGNOSIS — J189 Pneumonia, unspecified organism: Secondary | ICD-10-CM | POA: Diagnosis not present

## 2016-04-09 DIAGNOSIS — J441 Chronic obstructive pulmonary disease with (acute) exacerbation: Secondary | ICD-10-CM | POA: Diagnosis not present

## 2016-04-09 DIAGNOSIS — I10 Essential (primary) hypertension: Secondary | ICD-10-CM | POA: Diagnosis not present

## 2016-04-09 DIAGNOSIS — J440 Chronic obstructive pulmonary disease with acute lower respiratory infection: Secondary | ICD-10-CM | POA: Diagnosis not present

## 2016-04-09 DIAGNOSIS — F341 Dysthymic disorder: Secondary | ICD-10-CM | POA: Diagnosis not present

## 2016-04-09 DIAGNOSIS — K219 Gastro-esophageal reflux disease without esophagitis: Secondary | ICD-10-CM | POA: Diagnosis not present

## 2016-04-11 ENCOUNTER — Other Ambulatory Visit: Payer: Self-pay | Admitting: *Deleted

## 2016-04-11 DIAGNOSIS — F341 Dysthymic disorder: Secondary | ICD-10-CM | POA: Diagnosis not present

## 2016-04-11 DIAGNOSIS — J189 Pneumonia, unspecified organism: Secondary | ICD-10-CM | POA: Diagnosis not present

## 2016-04-11 DIAGNOSIS — I10 Essential (primary) hypertension: Secondary | ICD-10-CM | POA: Diagnosis not present

## 2016-04-11 DIAGNOSIS — K219 Gastro-esophageal reflux disease without esophagitis: Secondary | ICD-10-CM | POA: Diagnosis not present

## 2016-04-11 DIAGNOSIS — J440 Chronic obstructive pulmonary disease with acute lower respiratory infection: Secondary | ICD-10-CM | POA: Diagnosis not present

## 2016-04-11 DIAGNOSIS — J441 Chronic obstructive pulmonary disease with (acute) exacerbation: Secondary | ICD-10-CM | POA: Diagnosis not present

## 2016-04-11 NOTE — Patient Outreach (Signed)
Transition of care call #2:  Pt answered but is with her home health nurse at this time. She says she is feeling better. She continues to be active with PT. She will see Dr. Madilyn Fireman next week. I reminded her she can call me if she needs me. I will call her again next week.  Tamara Adams [facility] [place] [phone]

## 2016-04-12 DIAGNOSIS — F341 Dysthymic disorder: Secondary | ICD-10-CM | POA: Diagnosis not present

## 2016-04-12 DIAGNOSIS — K219 Gastro-esophageal reflux disease without esophagitis: Secondary | ICD-10-CM | POA: Diagnosis not present

## 2016-04-12 DIAGNOSIS — J441 Chronic obstructive pulmonary disease with (acute) exacerbation: Secondary | ICD-10-CM | POA: Diagnosis not present

## 2016-04-12 DIAGNOSIS — I10 Essential (primary) hypertension: Secondary | ICD-10-CM | POA: Diagnosis not present

## 2016-04-12 DIAGNOSIS — J189 Pneumonia, unspecified organism: Secondary | ICD-10-CM | POA: Diagnosis not present

## 2016-04-12 DIAGNOSIS — J440 Chronic obstructive pulmonary disease with acute lower respiratory infection: Secondary | ICD-10-CM | POA: Diagnosis not present

## 2016-04-14 DIAGNOSIS — Z79899 Other long term (current) drug therapy: Secondary | ICD-10-CM | POA: Diagnosis not present

## 2016-04-14 DIAGNOSIS — Z888 Allergy status to other drugs, medicaments and biological substances status: Secondary | ICD-10-CM | POA: Diagnosis not present

## 2016-04-14 DIAGNOSIS — R0600 Dyspnea, unspecified: Secondary | ICD-10-CM | POA: Diagnosis not present

## 2016-04-14 DIAGNOSIS — Z86718 Personal history of other venous thrombosis and embolism: Secondary | ICD-10-CM | POA: Diagnosis not present

## 2016-04-14 DIAGNOSIS — Z9981 Dependence on supplemental oxygen: Secondary | ICD-10-CM | POA: Diagnosis not present

## 2016-04-14 DIAGNOSIS — J449 Chronic obstructive pulmonary disease, unspecified: Secondary | ICD-10-CM | POA: Diagnosis not present

## 2016-04-14 DIAGNOSIS — R0602 Shortness of breath: Secondary | ICD-10-CM | POA: Diagnosis not present

## 2016-04-14 DIAGNOSIS — F17210 Nicotine dependence, cigarettes, uncomplicated: Secondary | ICD-10-CM | POA: Diagnosis not present

## 2016-04-14 DIAGNOSIS — R062 Wheezing: Secondary | ICD-10-CM | POA: Diagnosis not present

## 2016-04-14 DIAGNOSIS — I10 Essential (primary) hypertension: Secondary | ICD-10-CM | POA: Diagnosis not present

## 2016-04-14 DIAGNOSIS — Z885 Allergy status to narcotic agent status: Secondary | ICD-10-CM | POA: Diagnosis not present

## 2016-04-14 DIAGNOSIS — R05 Cough: Secondary | ICD-10-CM | POA: Diagnosis not present

## 2016-04-14 DIAGNOSIS — Z87442 Personal history of urinary calculi: Secondary | ICD-10-CM | POA: Diagnosis not present

## 2016-04-14 DIAGNOSIS — Z86010 Personal history of colonic polyps: Secondary | ICD-10-CM | POA: Diagnosis not present

## 2016-04-14 DIAGNOSIS — R110 Nausea: Secondary | ICD-10-CM | POA: Diagnosis not present

## 2016-04-16 DIAGNOSIS — Z7952 Long term (current) use of systemic steroids: Secondary | ICD-10-CM | POA: Diagnosis not present

## 2016-04-16 DIAGNOSIS — K6389 Other specified diseases of intestine: Secondary | ICD-10-CM | POA: Diagnosis not present

## 2016-04-16 DIAGNOSIS — Z79891 Long term (current) use of opiate analgesic: Secondary | ICD-10-CM | POA: Diagnosis not present

## 2016-04-16 DIAGNOSIS — R110 Nausea: Secondary | ICD-10-CM | POA: Diagnosis not present

## 2016-04-16 DIAGNOSIS — Z7951 Long term (current) use of inhaled steroids: Secondary | ICD-10-CM | POA: Diagnosis not present

## 2016-04-16 DIAGNOSIS — Z79899 Other long term (current) drug therapy: Secondary | ICD-10-CM | POA: Diagnosis not present

## 2016-04-17 ENCOUNTER — Other Ambulatory Visit: Payer: Self-pay | Admitting: *Deleted

## 2016-04-17 DIAGNOSIS — K219 Gastro-esophageal reflux disease without esophagitis: Secondary | ICD-10-CM | POA: Diagnosis not present

## 2016-04-17 DIAGNOSIS — J440 Chronic obstructive pulmonary disease with acute lower respiratory infection: Secondary | ICD-10-CM | POA: Diagnosis not present

## 2016-04-17 DIAGNOSIS — I10 Essential (primary) hypertension: Secondary | ICD-10-CM | POA: Diagnosis not present

## 2016-04-17 DIAGNOSIS — F341 Dysthymic disorder: Secondary | ICD-10-CM | POA: Diagnosis not present

## 2016-04-17 DIAGNOSIS — J441 Chronic obstructive pulmonary disease with (acute) exacerbation: Secondary | ICD-10-CM | POA: Diagnosis not present

## 2016-04-17 DIAGNOSIS — J189 Pneumonia, unspecified organism: Secondary | ICD-10-CM | POA: Diagnosis not present

## 2016-04-17 NOTE — Patient Outreach (Signed)
Transition of care call. Tamara Adams reports she has been to [facility] for follow up of her GI condition. 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 She weighs 75#.  Her breathing is fair. She says she went a day and 1/2 without O2 as it was disconnected from her concentrator and she didn't realize it. She said she had some difficulty and was relieved to discover the problem.   I advised her to call me with any problems or questions and I will call her again next week.  Deloria Lair [facility] [place] [phone]

## 2016-04-18 DIAGNOSIS — J440 Chronic obstructive pulmonary disease with acute lower respiratory infection: Secondary | ICD-10-CM | POA: Diagnosis not present

## 2016-04-18 DIAGNOSIS — I10 Essential (primary) hypertension: Secondary | ICD-10-CM | POA: Diagnosis not present

## 2016-04-18 DIAGNOSIS — J189 Pneumonia, unspecified organism: Secondary | ICD-10-CM | POA: Diagnosis not present

## 2016-04-18 DIAGNOSIS — J441 Chronic obstructive pulmonary disease with (acute) exacerbation: Secondary | ICD-10-CM | POA: Diagnosis not present

## 2016-04-19 ENCOUNTER — Ambulatory Visit: Payer: Medicare Other | Admitting: Family Medicine

## 2016-04-19 DIAGNOSIS — F341 Dysthymic disorder: Secondary | ICD-10-CM | POA: Diagnosis not present

## 2016-04-19 DIAGNOSIS — K219 Gastro-esophageal reflux disease without esophagitis: Secondary | ICD-10-CM | POA: Diagnosis not present

## 2016-04-19 DIAGNOSIS — J441 Chronic obstructive pulmonary disease with (acute) exacerbation: Secondary | ICD-10-CM | POA: Diagnosis not present

## 2016-04-19 DIAGNOSIS — J440 Chronic obstructive pulmonary disease with acute lower respiratory infection: Secondary | ICD-10-CM | POA: Diagnosis not present

## 2016-04-19 DIAGNOSIS — J189 Pneumonia, unspecified organism: Secondary | ICD-10-CM | POA: Diagnosis not present

## 2016-04-19 DIAGNOSIS — I10 Essential (primary) hypertension: Secondary | ICD-10-CM | POA: Diagnosis not present

## 2016-04-20 ENCOUNTER — Ambulatory Visit (INDEPENDENT_AMBULATORY_CARE_PROVIDER_SITE_OTHER): Payer: Medicare Other

## 2016-04-20 ENCOUNTER — Ambulatory Visit (INDEPENDENT_AMBULATORY_CARE_PROVIDER_SITE_OTHER): Payer: Medicare Other | Admitting: Family Medicine

## 2016-04-20 ENCOUNTER — Encounter: Payer: Self-pay | Admitting: Family Medicine

## 2016-04-20 VITALS — BP 177/74 | HR 101 | Ht 64.0 in | Wt 77.6 lb

## 2016-04-20 DIAGNOSIS — J438 Other emphysema: Secondary | ICD-10-CM

## 2016-04-20 DIAGNOSIS — Y95 Nosocomial condition: Principal | ICD-10-CM

## 2016-04-20 DIAGNOSIS — E43 Unspecified severe protein-calorie malnutrition: Secondary | ICD-10-CM | POA: Diagnosis not present

## 2016-04-20 DIAGNOSIS — J189 Pneumonia, unspecified organism: Secondary | ICD-10-CM

## 2016-04-20 DIAGNOSIS — E871 Hypo-osmolality and hyponatremia: Secondary | ICD-10-CM | POA: Diagnosis not present

## 2016-04-20 DIAGNOSIS — E876 Hypokalemia: Secondary | ICD-10-CM | POA: Diagnosis not present

## 2016-04-20 DIAGNOSIS — R5383 Other fatigue: Secondary | ICD-10-CM

## 2016-04-20 DIAGNOSIS — R0683 Snoring: Secondary | ICD-10-CM

## 2016-04-20 MED ORDER — PREDNISONE 20 MG PO TABS: ORAL_TABLET | ORAL | 0 refills | Status: DC

## 2016-04-20 MED ORDER — HYDROCODONE-ACETAMINOPHEN 10-325 MG PO TABS: 1.0000 | ORAL_TABLET | Freq: Four times a day (QID) | ORAL | 0 refills | Status: DC | PRN

## 2016-04-20 NOTE — Progress Notes (Signed)
Subjective:    CC: Hospital follow-up  HPI:  Tamara Adams is a 75 year old female who comes in today for follow-up for hospital admission on September 8 for healthcare acquired pneumonia with acute COPD exacerbation. She was discharged home on September 12. It actually been in the hospital previously on September 2 through the 6 also for community acquired pneumonia and COPD exacerbation. Despite treatment with Rocephin and azithromycin she became ill again. On this admission she was diagnosed with acute on chronic respiratory failure with hypoxemia. She was discharged home on doxycycline twice a day and a prednisone taper as well as encouraged to use a flutter valve. Nurse Coordinator her has been reaching out to her several times since her discharge to make sure that she is doing okay.'  Pre-operatively she was evaluated for sleep apnea. She says her risk factors were 5 out of 7 and they recommended that she discuss a sleep study with me when she followed up.  Past medical history, Surgical history, Family history not pertinant except as noted below, Social history, Allergies, and medications have been entered into the medical record, reviewed, and corrections made.   Review of Systems: No fevers, chills, night sweats, weight loss, chest pain, or shortness of breath.   Objective:    General: Well Developed, well nourished, and in no acute distress.  Neuro: Alert and oriented x3, extra-ocular muscles intact, sensation grossly intact.  HEENT: Normocephalic, atraumatic  Skin: Warm and dry, no rashes. Cardiac: Regular rate and rhythm, no murmurs rubs or gallops, no lower extremity edema.  Respiratory: mild diffuse wheezing. Not using accessory muscles, speaking in full sentences.   Impression and Recommendations:    Healthcare acquired pneumonia-she has completed her antibiotic. Due for repeat chest x-ray to ensure resolution of infiltrates. No have some wheezing on exam today to extend her  prednisone. New prescription sent to the pharmacy. Also place a referral to pulmonology  Hyponatremia-due for repeat BMP to check electrolytes and renal function today.  Protein calorie malnutrition/underweight-she's encouraged to start supplementation with boost or in sure.  She has been working to get it in.   Snoring-stopping questionnaire positive screen. Will refer for split sleep study at the [facility].

## 2016-04-21 LAB — BASIC METABOLIC PANEL WITH GFR
BUN: 19 mg/dL (ref 7–25)
CHLORIDE: 94 mmol/L — AB (ref 98–110)
CO2: 32 mmol/L — AB (ref 20–31)
CREATININE: 0.58 mg/dL — AB (ref 0.60–0.93)
Calcium: 8.8 mg/dL (ref 8.6–10.4)
GFR, Est African American: >89 mL/min (ref >=60)
GFR, Est Non African American: >89 mL/min (ref >=60)
Glucose, Bld: 97 mg/dL (ref 65–99)
POTASSIUM: 4.1 mmol/L (ref 3.5–5.3)
Sodium: 138 mmol/L (ref 135–146)

## 2016-04-23 DIAGNOSIS — J189 Pneumonia, unspecified organism: Secondary | ICD-10-CM | POA: Diagnosis not present

## 2016-04-23 DIAGNOSIS — J441 Chronic obstructive pulmonary disease with (acute) exacerbation: Secondary | ICD-10-CM | POA: Diagnosis not present

## 2016-04-23 DIAGNOSIS — K219 Gastro-esophageal reflux disease without esophagitis: Secondary | ICD-10-CM | POA: Diagnosis not present

## 2016-04-23 DIAGNOSIS — F341 Dysthymic disorder: Secondary | ICD-10-CM | POA: Diagnosis not present

## 2016-04-23 DIAGNOSIS — J440 Chronic obstructive pulmonary disease with acute lower respiratory infection: Secondary | ICD-10-CM | POA: Diagnosis not present

## 2016-04-23 DIAGNOSIS — I10 Essential (primary) hypertension: Secondary | ICD-10-CM | POA: Diagnosis not present

## 2016-04-24 ENCOUNTER — Other Ambulatory Visit: Payer: Self-pay | Admitting: *Deleted

## 2016-04-24 DIAGNOSIS — J189 Pneumonia, unspecified organism: Secondary | ICD-10-CM | POA: Diagnosis not present

## 2016-04-24 DIAGNOSIS — I10 Essential (primary) hypertension: Secondary | ICD-10-CM | POA: Diagnosis not present

## 2016-04-24 DIAGNOSIS — J441 Chronic obstructive pulmonary disease with (acute) exacerbation: Secondary | ICD-10-CM | POA: Diagnosis not present

## 2016-04-24 DIAGNOSIS — J440 Chronic obstructive pulmonary disease with acute lower respiratory infection: Secondary | ICD-10-CM | POA: Diagnosis not present

## 2016-04-24 DIAGNOSIS — K219 Gastro-esophageal reflux disease without esophagitis: Secondary | ICD-10-CM | POA: Diagnosis not present

## 2016-04-24 DIAGNOSIS — F341 Dysthymic disorder: Secondary | ICD-10-CM | POA: Diagnosis not present

## 2016-04-24 NOTE — Patient Outreach (Signed)
Transition of care. Pt did not answer her listed phone numbers today. I left 2 messages to please return my call.  Deloria Lair [facility] [place] [phone]

## 2016-04-25 ENCOUNTER — Other Ambulatory Visit: Payer: Self-pay | Admitting: *Deleted

## 2016-04-25 DIAGNOSIS — F341 Dysthymic disorder: Secondary | ICD-10-CM | POA: Diagnosis not present

## 2016-04-25 DIAGNOSIS — K219 Gastro-esophageal reflux disease without esophagitis: Secondary | ICD-10-CM | POA: Diagnosis not present

## 2016-04-25 DIAGNOSIS — J440 Chronic obstructive pulmonary disease with acute lower respiratory infection: Secondary | ICD-10-CM | POA: Diagnosis not present

## 2016-04-25 DIAGNOSIS — I10 Essential (primary) hypertension: Secondary | ICD-10-CM | POA: Diagnosis not present

## 2016-04-25 DIAGNOSIS — J189 Pneumonia, unspecified organism: Secondary | ICD-10-CM | POA: Diagnosis not present

## 2016-04-25 DIAGNOSIS — J441 Chronic obstructive pulmonary disease with (acute) exacerbation: Secondary | ICD-10-CM | POA: Diagnosis not present

## 2016-04-25 NOTE — Patient Outreach (Signed)
Transition of care call. Pt reports respiratory supply company just brought supplies. She is weak, still having moderate respiratory problems but controlled. GI problems still exist being followed at [facility]. No current [facility] case management needs today. I will call her again next week.   Deloria Lair [facility] [place] [phone]

## 2016-04-27 ENCOUNTER — Telehealth: Payer: Self-pay | Admitting: *Deleted

## 2016-04-27 NOTE — Telephone Encounter (Signed)
Pt advised to stop coffee, and to drink more water. She informed that she was alternating between zofran and phenergan. Pt advised to have some one come by to have some one p/u GI cocktail for her to try and if this does not work she should be go to the ED for further tx pt voiced understanding and agreed.Rebeca Alert, Destani Wamser

## 2016-04-30 ENCOUNTER — Emergency Department (HOSPITAL_BASED_OUTPATIENT_CLINIC_OR_DEPARTMENT_OTHER)
Admission: EM | Admit: 2016-04-30 | Discharge: 2016-04-30 | Disposition: A | Discharge status: Home / Self Care | Payer: Medicare Other | Attending: Emergency Medicine | Admitting: Emergency Medicine

## 2016-04-30 ENCOUNTER — Encounter (HOSPITAL_BASED_OUTPATIENT_CLINIC_OR_DEPARTMENT_OTHER): Payer: Self-pay | Admitting: *Deleted

## 2016-04-30 ENCOUNTER — Emergency Department (HOSPITAL_BASED_OUTPATIENT_CLINIC_OR_DEPARTMENT_OTHER): Payer: Medicare Other

## 2016-04-30 DIAGNOSIS — R1084 Generalized abdominal pain: Secondary | ICD-10-CM | POA: Insufficient documentation

## 2016-04-30 DIAGNOSIS — Z87891 Personal history of nicotine dependence: Secondary | ICD-10-CM | POA: Insufficient documentation

## 2016-04-30 DIAGNOSIS — M129 Arthropathy, unspecified: Secondary | ICD-10-CM | POA: Insufficient documentation

## 2016-04-30 DIAGNOSIS — J449 Chronic obstructive pulmonary disease, unspecified: Secondary | ICD-10-CM | POA: Insufficient documentation

## 2016-04-30 DIAGNOSIS — R1033 Periumbilical pain: Secondary | ICD-10-CM | POA: Diagnosis not present

## 2016-04-30 DIAGNOSIS — I10 Essential (primary) hypertension: Secondary | ICD-10-CM | POA: Diagnosis not present

## 2016-04-30 DIAGNOSIS — R0602 Shortness of breath: Secondary | ICD-10-CM | POA: Diagnosis not present

## 2016-04-30 DIAGNOSIS — Z87442 Personal history of urinary calculi: Secondary | ICD-10-CM | POA: Diagnosis not present

## 2016-04-30 DIAGNOSIS — Z7982 Long term (current) use of aspirin: Secondary | ICD-10-CM | POA: Insufficient documentation

## 2016-04-30 DIAGNOSIS — Z79899 Other long term (current) drug therapy: Secondary | ICD-10-CM | POA: Insufficient documentation

## 2016-04-30 DIAGNOSIS — R0600 Dyspnea, unspecified: Secondary | ICD-10-CM

## 2016-04-30 LAB — CBC WITH DIFFERENTIAL/PLATELET
BASOS PCT: 0 %
Basophils Absolute: 0 10*3/uL (ref 0.0–0.1)
EOS ABS: 0.1 10*3/uL (ref 0.0–0.7)
EOS PCT: 1 %
HCT: 32.2 % — ABNORMAL LOW (ref 36.0–46.0)
HEMOGLOBIN: 10.6 g/dL — AB (ref 12.0–15.0)
Lymphocytes Relative: 22 %
Lymphs Abs: 1.6 10*3/uL (ref 0.7–4.0)
MCH: 30.9 pg (ref 26.0–34.0)
MCHC: 32.9 g/dL (ref 30.0–36.0)
MCV: 93.9 fL (ref 78.0–100.0)
MONOS PCT: 10 %
Monocytes Absolute: 0.7 10*3/uL (ref 0.1–1.0)
NEUTROS PCT: 67 %
Neutro Abs: 4.7 10*3/uL (ref 1.7–7.7)
PLATELETS: 424 10*3/uL — AB (ref 150–400)
RBC: 3.43 MIL/uL — ABNORMAL LOW (ref 3.87–5.11)
RDW: 14.1 % (ref 11.5–15.5)
WBC: 7 10*3/uL (ref 4.0–10.5)

## 2016-04-30 LAB — COMPREHENSIVE METABOLIC PANEL
ALK PHOS: 44 U/L (ref 38–126)
ALT: 14 U/L (ref 14–54)
AST: 21 U/L (ref 15–41)
Albumin: 3.3 g/dL — ABNORMAL LOW (ref 3.5–5.0)
Anion gap: 5 (ref 5–15)
BUN: 11 mg/dL (ref 6–20)
CALCIUM: 8.8 mg/dL — AB (ref 8.9–10.3)
CO2: 36 mmol/L — AB (ref 22–32)
CREATININE: 0.51 mg/dL (ref 0.44–1.00)
Chloride: 96 mmol/L — ABNORMAL LOW (ref 101–111)
Glucose, Bld: 125 mg/dL — ABNORMAL HIGH (ref 65–99)
Potassium: 3.4 mmol/L — ABNORMAL LOW (ref 3.5–5.1)
Sodium: 137 mmol/L (ref 135–145)
Total Bilirubin: 0.4 mg/dL (ref 0.3–1.2)
Total Protein: 5.6 g/dL — ABNORMAL LOW (ref 6.5–8.1)

## 2016-04-30 LAB — LIPASE, BLOOD: LIPASE: 33 U/L (ref 11–51)

## 2016-04-30 MED ORDER — SODIUM CHLORIDE 0.9 % IV BOLUS (SEPSIS)
500.0000 mL | Freq: Once | INTRAVENOUS | Status: AC
  Administered 2016-04-30: 500 mL via INTRAVENOUS

## 2016-04-30 MED ORDER — MORPHINE SULFATE (PF) 4 MG/ML IV SOLN
4.0000 mg | Freq: Once | INTRAVENOUS | Status: AC
Start: 2016-04-30 — End: 2016-04-30
  Administered 2016-04-30: 4 mg via INTRAVENOUS
  Filled 2016-04-30: qty 1

## 2016-04-30 MED ORDER — ONDANSETRON HCL 4 MG/2ML IJ SOLN
4.0000 mg | Freq: Once | INTRAMUSCULAR | Status: AC
  Administered 2016-04-30: 4 mg via INTRAVENOUS
  Filled 2016-04-30: qty 2

## 2016-04-30 MED ORDER — MORPHINE SULFATE (PF) 4 MG/ML IV SOLN
4.0000 mg | Freq: Once | INTRAVENOUS | Status: AC
  Administered 2016-04-30: 4 mg via INTRAVENOUS
  Filled 2016-04-30: qty 1

## 2016-04-30 MED ORDER — IPRATROPIUM-ALBUTEROL 0.5-2.5 (3) MG/3ML IN SOLN
RESPIRATORY_TRACT | Status: AC
  Administered 2016-04-30: 3 mL
  Filled 2016-04-30: qty 3

## 2016-04-30 NOTE — ED Notes (Signed)
MD at bedside. 

## 2016-04-30 NOTE — Discharge Instructions (Signed)
Follow-up with your gastroenterologist as scheduled tomorrow to discuss your chronic abdominal pain.  Continue your breathing treatments every 4 hours as needed.

## 2016-04-30 NOTE — ED Notes (Signed)
Pt reports chronic abd pain with no specific diagnosis. Pt states pain is worse today than normal. She takes zofran and hydrocodone daily for same. Pt also reports palpitations for 2-3 days, denies chest pain. Reports increase in SOB, wears home o2

## 2016-04-30 NOTE — ED Provider Notes (Signed)
[facility] [place] DEPT [facility] Provider Note   CSN: [id number] Arrival date & time: 04/30/16  1804  By signing my name below, I, Neta Mends, attest that this documentation has been prepared under the direction and in the presence of Veryl Speak, MD . Electronically Signed: Neta Mends, ED Scribe. 04/30/2016. 6:38 PM.    History   Chief Complaint Chief Complaint  Patient presents with  . Shortness of Breath    The history is provided by the patient. No language interpreter was used.  Shortness of Breath Associated symptoms include abdominal pain. Pertinent negatives include no fever, no cough and no chest pain.   HPI Comments:  Tamara Adams is a 75 y.o. female with PMHx of COPD who presents to the Emergency Department with multiple complaints. Pt complains of constant SOB x3 days. Pt also complains of intermittent worsening episodes of abdominal pain since yesterday. Pt reports that she has had chronic abdominal pain for several years. Pt complains of associated nausea, palpitations, right foot swelling.  Pt uses 4L NCO2 at home. No alleviating factors noted.  Pt denies cough, fever, chest pain.   Past Medical History:  Diagnosis Date  . Anemia    as a child  . Arthritis    "hands" (12/24/2014)  . Complication of anesthesia    " My blood gas dropped during surgery so I was left on a ventilator and in ICU for 3 days."  . COPD (chronic obstructive pulmonary disease) (HCC)    FVC 72%, FEV1 29%, FEV1 ratio 32% (very severe (COPD)  . COPD (chronic obstructive pulmonary disease) ([place])   . Degenerative disc disease   . Depression with anxiety   . Diverticulosis   . DVT (deep venous thrombosis) ([place])    "LLE; years ago after a surgery"  . Essential hypertension   . Fluttering sensation of heart    pt put on metoprolol as a result  . GERD (gastroesophageal reflux disease)    PMH  . H/O hiatal hernia   . Headache    "maybe weekly" (12/24/2014)  . Hyperlipidemia   .  Hypertension   . Kidney stone   . Liver lesion   . On home oxygen therapy    "3L; sleep w/it; use it when I rest" (12/24/2014)  . Peptic ulcer   . Pinched nerve    in back  . Pneumonia   . PONV (postoperative nausea and vomiting)   . Shortness of breath dyspnea    with exertion  . Skipped heart beats   . Small bowel obstruction    "several times; OR twice" (12/24/2014)  . Tubular adenoma of colon 09/2011  . Wears glasses     Patient Active Problem List   Diagnosis Date Noted  . Physical deconditioning   . Hyponatremia   . SOB (shortness of breath)   . Constipation 03/25/2016  . Barrett's esophagus without dysplasia 12/15/2015  . Ileus ([place]) 12/02/2015  . Hypokalemia 12/02/2015  . Essential hypertension   . Esophageal reflux   . Depression with anxiety   . Generalized abdominal pain   . Leukocytosis 01/12/2015  . Aortic atherosclerosis ([place]) 10/05/2014  . Abnormal CT of liver 02/05/2014  . Severe protein-calorie malnutrition ([place]) 01/29/2014  . Small bowel obstruction 01/28/2014  . Acute on chronic respiratory failure with hypoxemia ([place]) 12/07/2013  . Lumbar degenerative disc disease 05/28/2013  . Gastric ulcer 04/22/2013  . Other malaise and fatigue 10/09/2012  . CHEST PAIN, PRECORDIAL 10/04/2010  . HLD (hyperlipidemia) 09/14/2010  .  History of tobacco abuse 09/14/2010  . COPD (chronic obstructive pulmonary disease) ([place]) 09/14/2010  . Insomnia 09/14/2010  . PALPITATIONS 09/14/2010  . CAROTID BRUIT 09/14/2010    Past Surgical History:  Procedure Laterality Date  . ABDOMINAL ADHESION SURGERY  12/24/2014  . ABDOMINAL HYSTERECTOMY  1987   and one ovary  . CATARACT EXTRACTION W/ INTRAOCULAR LENS  IMPLANT, BILATERAL Right 06/22/2012   Dr. Lester Kinsman.   Marland Kitchen CATARACT EXTRACTION W/PHACO Left 06/12/2012   Dr. Boykin Reaper  . CHOLECYSTECTOMY N/A 08/18/2013   Procedure: LAPAROSCOPIC CHOLECYSTECTOMY;  Surgeon: Harl Bowie, MD;  Location: [place];  Service: General;   Laterality: N/A;  . DILATION AND CURETTAGE OF UTERUS    . EXPLORATORY LAPAROTOMY  12/24/2014  . HERNIA REPAIR    . INCISIONAL HERNIA REPAIR  12/24/2014  . INCISIONAL HERNIA REPAIR N/A 12/24/2014   Procedure: HERNIA REPAIR INCISIONAL;  Surgeon: Coralie Keens, MD;  Location: [place];  Service: General;  Laterality: N/A;  . LAPAROTOMY N/A 12/24/2014   Procedure: EXPLORATORY LAPAROTOMY;  Surgeon: Coralie Keens, MD;  Location: [place];  Service: General;  Laterality: N/A;  . LYSIS OF ADHESION N/A 12/24/2014   Procedure: LYSIS OF ADHESION;  Surgeon: Coralie Keens, MD;  Location: [place];  Service: General;  Laterality: N/A;  . MULTIPLE TOOTH EXTRACTIONS    . OOPHORECTOMY  1992  . SMALL INTESTINE SURGERY     for a blockage, no bowel was removed, just kinked from scar tissue  . TUBAL LIGATION    . URETHRAL DILATION      OB History    No data available       Home Medications    Prior to Admission medications   Medication Sig Start Date End Date Taking? Authorizing Provider  acetaminophen (TYLENOL) 325 MG tablet Take 325-650 mg by mouth every 6 (six) hours as needed for headache.    Historical Provider, MD  albuterol (PROVENTIL) (2.5 MG/3ML) 0.083% nebulizer solution USE 1 VIAL (3 ML) VIA NEBULIZER EVERY 6 HOURS AS NEEDED FOR WHEEZING OR SHORTNESS OF BREATH Patient taking differently: USE 1 VIAL (3 ML) VIA NEBULIZER THREE TIMES DAILY 10/31/15   [name], MD  amLODipine (NORVASC) 5 MG tablet Take 1 tablet (5 mg total) by mouth daily. 02/21/16   [name], MD  aspirin EC 81 MG tablet Take 81 mg by mouth daily.    Historical Provider, MD  dicyclomine (BENTYL) 20 MG tablet Take 1 tablet (20 mg total) by mouth 4 (four) times daily -  before meals and at bedtime. 02/22/16   [name], MD  doxycycline (VIBRAMYCIN) 100 MG capsule  04/16/16   Historical Provider, MD  ENSURE PLUS (ENSURE PLUS) LIQD Take 237 mLs by mouth See admin instructions. Two to three times a day to cause  weight GAIN    Historical Provider, MD  fluticasone furoate-vilanterol (BREO ELLIPTA) 100-25 MCG/INH AEPB USE 1 INHALATION DAILY Patient taking differently: Inhale 1 puff into the lungs every evening.  01/19/16   [name], MD  guaiFENesin (MUCINEX) 600 MG 12 hr tablet Take 1 tablet (600 mg total) by mouth 2 (two) times daily. 04/03/16   Barton Dubois, MD  HYDROcodone-acetaminophen [facility]) 10-325 MG tablet Take 1 tablet by mouth every 6 (six) hours as needed. 04/20/16   [name], MD  KLOR-CON M10 10 MEQ tablet Take 1 tablet (10 mEq total) by mouth daily. 02/01/16   [name], MD  Linaclotide Rolan Lipa) 290 MCG CAPS capsule Take  1 capsule (290 mcg total) by mouth daily. 03/08/15   [name], MD  LORazepam (ATIVAN) 1 MG tablet Take 1 tablet (1 mg total) by mouth daily as needed for anxiety or sleep. Patient taking differently: Take 1 mg by mouth at bedtime. FOR SLEEP 02/22/16 10/04/16  Jade L Breeback, PA-C  losartan (COZAAR) 100 MG tablet Take 1 tablet (100 mg total) by mouth daily. 01/30/16   [name], MD  magnesium oxide (MAG-OX) 400 (241.3 Mg) MG tablet Take 1 tablet (400 mg total) by mouth daily. 12/06/15   Theodis Blaze, MD  metoprolol succinate (TOPROL-XL) 50 MG 24 hr tablet Take 1 tablet (50 mg total) by mouth daily. 01/30/16   [name], MD  mirtazapine (REMERON) 15 MG tablet  04/16/16   Historical Provider, MD  Multiple Vitamin (MULTIVITAMIN WITH MINERALS) TABS tablet Take 1 tablet by mouth daily. Pt uses One-A-Day brand    Historical Provider, MD  nicotine (NICOTROL) 10 MG inhaler Inhale 10 mg into the lungs 2 (two) times daily as needed for smoking cessation.  06/23/15   Historical Provider, MD  omeprazole (PRILOSEC) 40 MG capsule Take 1 capsule (40 mg total) by mouth 2 (two) times daily. 04/03/16   Barton Dubois, MD  ondansetron (ZOFRAN-ODT) 8 MG disintegrating tablet TAKE 1 TABLET EVERY 8 HOURS AS NEEDED FOR NAUSEA Patient taking  differently: Take 8 mg by mouth every 8 (eight) hours as needed for nausea. TAKE 1 TABLET EVERY 8 HOURS AS NEEDED FOR NAUSEA 02/22/16   [name], MD  OXYGEN Inhale 4 L into the lungs continuous.    Historical Provider, MD  PARoxetine (PAXIL) 20 MG tablet Take 1 tablet (20 mg total) by mouth daily. 02/21/16   Sean Hommel, DO  predniSONE (DELTASONE) 20 MG tablet After 4 days decrease to half a tab for 4 days. 04/20/16   [name], MD  PROAIR HFA 108 [phone] Base) MCG/ACT inhaler USE 2 TO 4 INHALATIONS EVERY 4 TO 6 HOURS AS NEEDED Patient taking differently: USE 2 TO 4 INHALATIONS EVERY 4 TO 6 HOURS AS NEEDED FOR SHORTNESS OF BREATH/WHEEZING 10/31/15   [name], MD  promethazine (PHENERGAN) 25 MG tablet Take 12.5 mg by mouth every 8 (eight) hours as needed for nausea.  12/26/15   Historical Provider, MD  traMADol (ULTRAM) 50 MG tablet Take 1 tablet (50 mg total) by mouth every 6 (six) hours as needed (pain). 02/22/16   Jade L Breeback, PA-C  Umeclidinium Bromide (INCRUSE ELLIPTA) 62.5 MCG/INH AEPB Inhale 1 Inhaler into the lungs daily. Patient taking differently: Inhale 1 puff into the lungs every morning.  04/25/15   [name], MD    Family History Family History  Problem Relation Age of Onset  . Hypertension Mother   . Ovarian cancer Mother   . Glaucoma Mother   . Glaucoma Sister   . Colon cancer Neg Hx   . Colon polyps Neg Hx   . Diabetes Neg Hx   . Kidney disease Neg Hx   . Gallbladder disease Neg Hx   . Esophageal cancer Neg Hx     Social History Social History  Substance Use Topics  . Smoking status: Former Smoker    Packs/day: 0.25    Years: 57.00    Types: Cigarettes    Quit date: 07/25/2015  . Smokeless tobacco: Never Used     Comment: 5 cigarettes a day  . Alcohol use No     Allergies  Maxidex [dexamethasone]; Vicodin [hydrocodone-acetaminophen]; Advair diskus [fluticasone-salmeterol]; Remeron [mirtazapine]; Trazodone and nefazodone; and  Tylox [oxycodone-acetaminophen]   Review of Systems Review of Systems  Constitutional: Negative for fever.  Respiratory: Positive for shortness of breath. Negative for cough.   Cardiovascular: Positive for palpitations. Negative for chest pain.  Gastrointestinal: Positive for abdominal pain and nausea.  Musculoskeletal: Positive for joint swelling.  All other systems reviewed and are negative.    Physical Exam Updated Vital Signs BP 115/84 (BP Location: Right Arm)   Pulse 103   Temp 97.6 F (36.4 C)   Ht '5\' 4"'$  (1.626 m)   Wt 77 lb (34.9 kg)   SpO2 100%   BMI 13.22 kg/m   Physical Exam  Constitutional: She is oriented to person, place, and time. She appears well-developed and well-nourished. No distress.  Patient is very thin, cachectic.  HENT:  Head: Normocephalic and atraumatic.  Mouth/Throat: Oropharynx is clear and moist.  Eyes: Conjunctivae are normal.  Neck: Neck supple.  Cardiovascular: Normal rate and regular rhythm.   No murmur heard. Pulmonary/Chest: Effort normal. No respiratory distress. She has wheezes.  Slight expiratory wheezing bilaterally.   Abdominal: Soft. Bowel sounds are normal. She exhibits no distension and no mass. There is no tenderness.  Musculoskeletal: She exhibits no edema.  Neurological: She is alert and oriented to person, place, and time.  Skin: Skin is warm and dry.  Psychiatric: She has a normal mood and affect.  Nursing note and vitals reviewed.    ED Treatments / Results  DIAGNOSTIC STUDIES:  Oxygen Saturation is 100% on NCO2, normal by my interpretation.    COORDINATION OF CARE:  6:38 PM Discussed treatment plan with pt at bedside and pt agreed to plan.   Labs (all labs ordered are listed, but only abnormal results are displayed) Labs Reviewed - No data to display  EKG  EKG Interpretation  Date/Time:  Monday April 30 2016 18:30:12 EDT Ventricular Rate:  87 PR Interval:    QRS Duration: 80 QT Interval:  349 QTC  Calculation: 420 R Axis:   81 Text Interpretation:  Sinus rhythm Short PR interval Right atrial enlargement Borderline right axis deviation Borderline repolarization abnormality Confirmed by Castor Gittleman  MD, Jamorian Dimaria ([id number]) on 04/30/2016 7:03:05 PM       Radiology No results found.  Procedures Procedures (including critical care time)  Medications Ordered in ED Medications - No data to display   Initial Impression / Assessment and Plan / ED Course  I have reviewed the triage vital signs and the nursing notes.  Pertinent labs & imaging results that were available during my care of the patient were reviewed by me and considered in my medical decision making (see chart for details).  Clinical Course    Patient presents for evaluation of both shortness of breath and abdominal pain. She has a history of chronic abdominal pain which is been ongoing for the past 4 years. She is seen a GI specialist at [facility] and is due to see him in the morning. Her laboratory studies are reassuring and acute abdominal series reveals no evidence for obstruction.  She is given a breathing treatment and is feeling better. There is no evidence for pneumonia or acute cardiac event. I see no indication for admission and feel as though the patient is appropriate for discharge with follow-up with her GI doctor tomorrow. She was given pain medication here in the ER and is advised to follow-up as scheduled.  Final Clinical Impressions(s) / ED Diagnoses  Final diagnoses:  None    New Prescriptions New Prescriptions   No medications on file  I personally performed the services described in this documentation, which was scribed in my presence. The recorded information has been reviewed and is accurate.        Veryl Speak, MD 04/30/16 [phone]

## 2016-04-30 NOTE — ED Triage Notes (Signed)
Swelling in her right ankle. Sob. COPD. Home oxygen. C.o nausea and abdominal pain which she states is normal for her. She has been evaluated for the past 4 years. She started taking antibiotics 2 weeks ago to get rid of bacterial colony.

## 2016-05-01 ENCOUNTER — Other Ambulatory Visit: Payer: Self-pay | Admitting: *Deleted

## 2016-05-01 DIAGNOSIS — Z79899 Other long term (current) drug therapy: Secondary | ICD-10-CM | POA: Diagnosis not present

## 2016-05-01 DIAGNOSIS — Z7951 Long term (current) use of inhaled steroids: Secondary | ICD-10-CM | POA: Diagnosis not present

## 2016-05-01 DIAGNOSIS — Z79891 Long term (current) use of opiate analgesic: Secondary | ICD-10-CM | POA: Diagnosis not present

## 2016-05-01 DIAGNOSIS — Z792 Long term (current) use of antibiotics: Secondary | ICD-10-CM | POA: Diagnosis not present

## 2016-05-01 DIAGNOSIS — Z7952 Long term (current) use of systemic steroids: Secondary | ICD-10-CM | POA: Diagnosis not present

## 2016-05-01 DIAGNOSIS — K30 Functional dyspepsia: Secondary | ICD-10-CM | POA: Diagnosis not present

## 2016-05-01 DIAGNOSIS — K6389 Other specified diseases of intestine: Secondary | ICD-10-CM | POA: Diagnosis not present

## 2016-05-01 NOTE — Patient Outreach (Signed)
Transition of care call. I spoke to pt's grand daughter Vida Roller, who resides with pt and her husband. She said her grandmother is not able to come to the phone at the moment. She reports she had an ED visit yesterday for SOB. She was treated and released. Today, she is going to see her GI specialist at [facility]. I asked Vida Roller, to please tell Mrs. File I called and that I am thinking about her and that I would like to talk to her.  Deloria Lair [facility] [place] [phone]

## 2016-05-02 DIAGNOSIS — F341 Dysthymic disorder: Secondary | ICD-10-CM | POA: Diagnosis not present

## 2016-05-02 DIAGNOSIS — J189 Pneumonia, unspecified organism: Secondary | ICD-10-CM | POA: Diagnosis not present

## 2016-05-02 DIAGNOSIS — I10 Essential (primary) hypertension: Secondary | ICD-10-CM | POA: Diagnosis not present

## 2016-05-02 DIAGNOSIS — K219 Gastro-esophageal reflux disease without esophagitis: Secondary | ICD-10-CM | POA: Diagnosis not present

## 2016-05-02 DIAGNOSIS — J441 Chronic obstructive pulmonary disease with (acute) exacerbation: Secondary | ICD-10-CM | POA: Diagnosis not present

## 2016-05-02 DIAGNOSIS — J440 Chronic obstructive pulmonary disease with acute lower respiratory infection: Secondary | ICD-10-CM | POA: Diagnosis not present

## 2016-05-03 DIAGNOSIS — K219 Gastro-esophageal reflux disease without esophagitis: Secondary | ICD-10-CM | POA: Diagnosis not present

## 2016-05-03 DIAGNOSIS — J441 Chronic obstructive pulmonary disease with (acute) exacerbation: Secondary | ICD-10-CM | POA: Diagnosis not present

## 2016-05-03 DIAGNOSIS — J189 Pneumonia, unspecified organism: Secondary | ICD-10-CM | POA: Diagnosis not present

## 2016-05-03 DIAGNOSIS — J440 Chronic obstructive pulmonary disease with acute lower respiratory infection: Secondary | ICD-10-CM | POA: Diagnosis not present

## 2016-05-03 DIAGNOSIS — I10 Essential (primary) hypertension: Secondary | ICD-10-CM | POA: Diagnosis not present

## 2016-05-03 DIAGNOSIS — F341 Dysthymic disorder: Secondary | ICD-10-CM | POA: Diagnosis not present

## 2016-05-04 ENCOUNTER — Other Ambulatory Visit: Payer: Self-pay | Admitting: Family Medicine

## 2016-05-04 ENCOUNTER — Encounter: Payer: Self-pay | Admitting: Family Medicine

## 2016-05-04 ENCOUNTER — Ambulatory Visit (INDEPENDENT_AMBULATORY_CARE_PROVIDER_SITE_OTHER): Payer: Medicare Other | Admitting: Family Medicine

## 2016-05-04 ENCOUNTER — Ambulatory Visit (HOSPITAL_BASED_OUTPATIENT_CLINIC_OR_DEPARTMENT_OTHER)
Admission: RE | Admit: 2016-05-04 | Discharge: 2016-05-04 | Disposition: A | Discharge status: Home / Self Care | Payer: Medicare Other | Source: Ambulatory Visit | Attending: Family Medicine | Admitting: Family Medicine

## 2016-05-04 VITALS — BP 197/67 | HR 89 | Wt 79.0 lb

## 2016-05-04 DIAGNOSIS — G8929 Other chronic pain: Secondary | ICD-10-CM

## 2016-05-04 DIAGNOSIS — E43 Unspecified severe protein-calorie malnutrition: Secondary | ICD-10-CM | POA: Diagnosis not present

## 2016-05-04 DIAGNOSIS — M7989 Other specified soft tissue disorders: Secondary | ICD-10-CM | POA: Insufficient documentation

## 2016-05-04 DIAGNOSIS — J438 Other emphysema: Secondary | ICD-10-CM | POA: Diagnosis not present

## 2016-05-04 DIAGNOSIS — R1013 Epigastric pain: Secondary | ICD-10-CM | POA: Diagnosis not present

## 2016-05-04 DIAGNOSIS — R319 Hematuria, unspecified: Secondary | ICD-10-CM

## 2016-05-04 LAB — POCT URINALYSIS DIPSTICK
Bilirubin, UA: NEGATIVE
Glucose, UA: NEGATIVE
KETONES UA: NEGATIVE
LEUKOCYTES UA: NEGATIVE
NITRITE UA: NEGATIVE
PH UA: 8.5
PROTEIN UA: 30
Spec Grav, UA: 1.015
Urobilinogen, UA: 0.2

## 2016-05-04 MED ORDER — SUCRALFATE 1 GM/10ML PO SUSP: 1.0000 g | Freq: Three times a day (TID) | ORAL | 0 refills | Status: DC

## 2016-05-04 MED ORDER — AMBULATORY NON FORMULARY MEDICATION: 3 refills | Status: DC

## 2016-05-04 NOTE — Progress Notes (Signed)
Subjective:    CC:   HPI:  F/U severe COPD - she does feel like she is back to baseline.   She is wearing her oxygen today. No chest pain. She does have shortness of breath with exertion but this is her baseline.  Right foot swelling x 5 days.  No known injury or trauma. She denies any recent changes. No pain in her leg.  Protein calorie malnutrition/underweight-her weight is up 2 pounds from when I saw her 3 weeks ago.  She did end up going back to the ED for abdominal pain. She started to experience more pain. The home health nurse actually called here and we have sent home a GI cocktail. It did provide relief but then the following day the pain recurred and she went to the emergency department. GI in that writing her a new prescription for GI cocktail but slightly different. She says the one that we gave her worked better and actually lasted longer and she like to have a prescription for that one.  BP (!) 197/67   Pulse 89   Wt 79 lb (35.8 kg)   SpO2 (!) 71% Comment: 5L  BMI 13.56 kg/m     Allergies  Allergen Reactions  . Maxidex [Dexamethasone] Other (See Comments)    DEHYDRATION AND HEART RACING  . Vicodin [Hydrocodone-Acetaminophen] Other (See Comments)    Hallucinations  . Advair Diskus [Fluticasone-Salmeterol] Other (See Comments)    No benefit with lungs (INEFFECTIVE)  . Remeron [Mirtazapine] Other (See Comments)    CAUSED NIGHTMARES  . Trazodone And Nefazodone Other (See Comments)    Heart pounding  . Tylox [Oxycodone-Acetaminophen] Itching    Past Medical History:  Diagnosis Date  . Anemia    as a child  . Arthritis    "hands" (12/24/2014)  . Complication of anesthesia    " My blood gas dropped during surgery so I was left on a ventilator and in ICU for 3 days."  . COPD (chronic obstructive pulmonary disease) (HCC)    FVC 72%, FEV1 29%, FEV1 ratio 32% (very severe (COPD)  . COPD (chronic obstructive pulmonary disease) ([place])   . Degenerative disc disease   .  Depression with anxiety   . Diverticulosis   . DVT (deep venous thrombosis) ([place])    "LLE; years ago after a surgery"  . Essential hypertension   . Fluttering sensation of heart    pt put on metoprolol as a result  . GERD (gastroesophageal reflux disease)    PMH  . H/O hiatal hernia   . Headache    "maybe weekly" (12/24/2014)  . Hyperlipidemia   . Hypertension   . Kidney stone   . Liver lesion   . On home oxygen therapy    "3L; sleep w/it; use it when I rest" (12/24/2014)  . Peptic ulcer   . Pinched nerve    in back  . Pneumonia   . PONV (postoperative nausea and vomiting)   . Shortness of breath dyspnea    with exertion  . Skipped heart beats   . Small bowel obstruction    "several times; OR twice" (12/24/2014)  . Tubular adenoma of colon 09/2011  . Wears glasses     Past Surgical History:  Procedure Laterality Date  . ABDOMINAL ADHESION SURGERY  12/24/2014  . ABDOMINAL HYSTERECTOMY  1987   and one ovary  . CATARACT EXTRACTION W/ INTRAOCULAR LENS  IMPLANT, BILATERAL Right 06/22/2012   Dr. Lester Kinsman.   Marland Kitchen CATARACT EXTRACTION W/PHACO Left  06/12/2012   Dr. Boykin Reaper  . CHOLECYSTECTOMY N/A 08/18/2013   Procedure: LAPAROSCOPIC CHOLECYSTECTOMY;  Surgeon: Harl Bowie, MD;  Location: [place];  Service: General;  Laterality: N/A;  . DILATION AND CURETTAGE OF UTERUS    . EXPLORATORY LAPAROTOMY  12/24/2014  . HERNIA REPAIR    . INCISIONAL HERNIA REPAIR  12/24/2014  . INCISIONAL HERNIA REPAIR N/A 12/24/2014   Procedure: HERNIA REPAIR INCISIONAL;  Surgeon: Coralie Keens, MD;  Location: [place];  Service: General;  Laterality: N/A;  . LAPAROTOMY N/A 12/24/2014   Procedure: EXPLORATORY LAPAROTOMY;  Surgeon: Coralie Keens, MD;  Location: [place];  Service: General;  Laterality: N/A;  . LYSIS OF ADHESION N/A 12/24/2014   Procedure: LYSIS OF ADHESION;  Surgeon: Coralie Keens, MD;  Location: [place];  Service: General;  Laterality: N/A;  . MULTIPLE TOOTH EXTRACTIONS    . OOPHORECTOMY  1992   . SMALL INTESTINE SURGERY     for a blockage, no bowel was removed, just kinked from scar tissue  . TUBAL LIGATION    . URETHRAL DILATION      Social History   Social History  . Marital status: Married    Spouse name: N/A  . Number of children: 3  . Years of education: N/A   Occupational History  . retired.      Social History Main Topics  . Smoking status: Former Smoker    Packs/day: 0.25    Years: 57.00    Types: Cigarettes    Quit date: 07/25/2015  . Smokeless tobacco: Never Used     Comment: 5 cigarettes a day  . Alcohol use No  . Drug use: No  . Sexual activity: No   Other Topics Concern  . Not on file   Social History Narrative   No regular exercise.     Family History  Problem Relation Age of Onset  . Hypertension Mother   . Ovarian cancer Mother   . Glaucoma Mother   . Glaucoma Sister   . Colon cancer Neg Hx   . Colon polyps Neg Hx   . Diabetes Neg Hx   . Kidney disease Neg Hx   . Gallbladder disease Neg Hx   . Esophageal cancer Neg Hx     Outpatient Encounter Prescriptions as of 05/04/2016  Medication Sig  . acetaminophen (TYLENOL) 325 MG tablet Take 325-650 mg by mouth every 6 (six) hours as needed for headache.  . albuterol (PROVENTIL) (2.5 MG/3ML) 0.083% nebulizer solution USE 1 VIAL (3 ML) VIA NEBULIZER EVERY 6 HOURS AS NEEDED FOR WHEEZING OR SHORTNESS OF BREATH (Patient taking differently: USE 1 VIAL (3 ML) VIA NEBULIZER THREE TIMES DAILY)  . amLODipine (NORVASC) 5 MG tablet TAKE 1 TABLET DAILY  . aspirin EC 81 MG tablet Take 81 mg by mouth daily.  Marland Kitchen dicyclomine (BENTYL) 20 MG tablet Take 1 tablet (20 mg total) by mouth 4 (four) times daily -  before meals and at bedtime.  . ENSURE PLUS (ENSURE PLUS) LIQD Take 237 mLs by mouth See admin instructions. Two to three times a day to cause weight GAIN  . ERYPED 200 200 MG/5ML suspension   . fluticasone furoate-vilanterol (BREO ELLIPTA) 100-25 MCG/INH AEPB USE 1 INHALATION DAILY (Patient taking  differently: Inhale 1 puff into the lungs every evening. )  . guaiFENesin (MUCINEX) 600 MG 12 hr tablet Take 1 tablet (600 mg total) by mouth 2 (two) times daily.  Marland Kitchen HYDROcodone-acetaminophen (NORCO) 10-325 MG tablet Take 1 tablet by mouth  every 6 (six) hours as needed.  Marland Kitchen KLOR-CON M10 10 MEQ tablet Take 1 tablet (10 mEq total) by mouth daily.  Marland Kitchen LORazepam (ATIVAN) 1 MG tablet Take 1 tablet (1 mg total) by mouth daily as needed for anxiety or sleep. (Patient taking differently: Take 1 mg by mouth at bedtime. FOR SLEEP)  . losartan (COZAAR) 100 MG tablet Take 1 tablet (100 mg total) by mouth daily.  . magnesium oxide (MAG-OX) 400 (241.3 Mg) MG tablet Take 1 tablet (400 mg total) by mouth daily.  . metoprolol succinate (TOPROL-XL) 50 MG 24 hr tablet Take 1 tablet (50 mg total) by mouth daily.  . mirtazapine (REMERON) 15 MG tablet   . Multiple Vitamin (MULTIVITAMIN WITH MINERALS) TABS tablet Take 1 tablet by mouth daily. Pt uses One-A-Day brand  . nicotine (NICOTROL) 10 MG inhaler Inhale 10 mg into the lungs 2 (two) times daily as needed for smoking cessation.   Marland Kitchen omeprazole (PRILOSEC) 40 MG capsule Take 1 capsule (40 mg total) by mouth 2 (two) times daily.  . ondansetron (ZOFRAN-ODT) 8 MG disintegrating tablet TAKE 1 TABLET EVERY 8 HOURS AS NEEDED FOR NAUSEA (Patient taking differently: Take 8 mg by mouth every 8 (eight) hours as needed for nausea. TAKE 1 TABLET EVERY 8 HOURS AS NEEDED FOR NAUSEA)  . OXYGEN Inhale 4 L into the lungs continuous.  Marland Kitchen PARoxetine (PAXIL) 20 MG tablet Take 1 tablet (20 mg total) by mouth daily.  . predniSONE (DELTASONE) 20 MG tablet After 4 days decrease to half a tab for 4 days.  Marland Kitchen PROAIR HFA 108 (90 Base) MCG/ACT inhaler USE 2 TO 4 INHALATIONS EVERY 4 TO 6 HOURS AS NEEDED (Patient taking differently: USE 2 TO 4 INHALATIONS EVERY 4 TO 6 HOURS AS NEEDED FOR SHORTNESS OF BREATH/WHEEZING)  . promethazine (PHENERGAN) 25 MG tablet Take 12.5 mg by mouth every 8 (eight) hours as  needed for nausea.   . traMADol (ULTRAM) 50 MG tablet Take 1 tablet (50 mg total) by mouth every 6 (six) hours as needed (pain).  Marland Kitchen Umeclidinium Bromide (INCRUSE ELLIPTA) 62.5 MCG/INH AEPB Inhale 1 Inhaler into the lungs daily. (Patient taking differently: Inhale 1 puff into the lungs every morning. )  . AMBULATORY NON FORMULARY MEDICATION Medication Name: GI cocktail: 191m Lidocaine2%, 1070mMaalox, 5034monnatol  . sucralfate (CARAFATE) 1 GM/10ML suspension Take 10 mLs (1 g total) by mouth 4 (four) times daily -  with meals and at bedtime.  . [DISCONTINUED] amLODipine (NORVASC) 5 MG tablet Take 1 tablet (5 mg total) by mouth daily.  . [DISCONTINUED] doxycycline (VIBRAMYCIN) 100 MG capsule   . [DISCONTINUED] Linaclotide (LINZESS) 290 MCG CAPS capsule Take 1 capsule (290 mcg total) by mouth daily.   No facility-administered encounter medications on file as of 05/04/2016.      .   Objective:    General: Well Developed, well nourished, and in no acute distress.  Neuro: Alert and oriented x3, extra-ocular muscles intact, sensation grossly intact.  HEENT: Normocephalic, atraumatic  Skin: Warm and dry, no rashes. Cardiac: Regular rate and rhythm, no murmurs rubs or gallops, no lower extremity edema.  Respiratory: Clear to auscultation bilaterally. Not using accessory muscles, speaking in full sentences. MSK: Right leg with normal flexion of the knee and ankle. She has some bluish discoloration of the toes and some mottling over the lower extremity and her foot does feel cold to touch. She has some mild 1+ edema over the top of the foot and over the outer ankle.  It does not extend up the shin.   Impression and Recommendations:   COPD, severe- Stable. Continue chronic oxygen therapy and continue to monitor for new symptoms. Continue with Breo and Incruse.  Right foot swelling-unclear etiology without any trauma or injury. If it were heart failure or protein deficiencies and it typically would  be bilateral swelling. She is at higher risk for DVTs and she is very sedentary secondary to her severe COPD. We'll schedule her for a venous Doppler.  Urinalysis did reveal some proteinuria so certainly this could be contributing to her swelling. She also had some moderate blood in the urinalysis and will send for microscopic review to see if there whole red blood cells present.  Chronic abdominal pain secondary to recurrent small bowel obstructions and surgery and gastritis-we'll try to get a new prescription sent for the GI cocktail over to her pharmacy. Also consider starting carafate.

## 2016-05-04 NOTE — Addendum Note (Signed)
Addended by: Teddy Spike on: 05/04/2016 05:06 PM   Modules accepted: Orders

## 2016-05-05 LAB — URINALYSIS, MICROSCOPIC ONLY
BACTERIA UA: NONE SEEN [HPF]
Casts: NONE SEEN [LPF]
Crystals: NONE SEEN [HPF]
SQUAMOUS EPITHELIAL / LPF: NONE SEEN [HPF] (ref <=5)
WBC UA: NONE SEEN WBC/HPF (ref <=5)
Yeast: NONE SEEN [HPF]

## 2016-05-07 ENCOUNTER — Other Ambulatory Visit: Payer: Self-pay | Admitting: *Deleted

## 2016-05-07 NOTE — Patient Outreach (Signed)
05/07/16- Telephone call to patient for transition of care week 4, no answer to home telephone and no option to leave voicemail,  RN  CM called cell phone and no answer to telephone, left voicemail requesting return phone call.  Jacqlyn Larsen [facility], [place] Coordinator [phone]

## 2016-05-08 ENCOUNTER — Other Ambulatory Visit: Payer: Self-pay | Admitting: *Deleted

## 2016-05-08 DIAGNOSIS — K219 Gastro-esophageal reflux disease without esophagitis: Secondary | ICD-10-CM | POA: Diagnosis not present

## 2016-05-08 DIAGNOSIS — R319 Hematuria, unspecified: Secondary | ICD-10-CM

## 2016-05-08 DIAGNOSIS — J440 Chronic obstructive pulmonary disease with acute lower respiratory infection: Secondary | ICD-10-CM | POA: Diagnosis not present

## 2016-05-08 DIAGNOSIS — J189 Pneumonia, unspecified organism: Secondary | ICD-10-CM | POA: Diagnosis not present

## 2016-05-08 DIAGNOSIS — I10 Essential (primary) hypertension: Secondary | ICD-10-CM | POA: Diagnosis not present

## 2016-05-08 DIAGNOSIS — F341 Dysthymic disorder: Secondary | ICD-10-CM | POA: Diagnosis not present

## 2016-05-08 DIAGNOSIS — J441 Chronic obstructive pulmonary disease with (acute) exacerbation: Secondary | ICD-10-CM | POA: Diagnosis not present

## 2016-05-08 NOTE — Addendum Note (Signed)
Addended by: Teddy Spike on: 05/08/2016 12:38 PM   Modules accepted: Orders

## 2016-05-09 DIAGNOSIS — J441 Chronic obstructive pulmonary disease with (acute) exacerbation: Secondary | ICD-10-CM | POA: Diagnosis not present

## 2016-05-09 DIAGNOSIS — J440 Chronic obstructive pulmonary disease with acute lower respiratory infection: Secondary | ICD-10-CM | POA: Diagnosis not present

## 2016-05-09 DIAGNOSIS — J189 Pneumonia, unspecified organism: Secondary | ICD-10-CM | POA: Diagnosis not present

## 2016-05-09 DIAGNOSIS — I10 Essential (primary) hypertension: Secondary | ICD-10-CM | POA: Diagnosis not present

## 2016-05-09 DIAGNOSIS — K219 Gastro-esophageal reflux disease without esophagitis: Secondary | ICD-10-CM | POA: Diagnosis not present

## 2016-05-09 DIAGNOSIS — F341 Dysthymic disorder: Secondary | ICD-10-CM | POA: Diagnosis not present

## 2016-05-14 DIAGNOSIS — R634 Abnormal weight loss: Secondary | ICD-10-CM | POA: Diagnosis not present

## 2016-05-14 DIAGNOSIS — K30 Functional dyspepsia: Secondary | ICD-10-CM | POA: Diagnosis not present

## 2016-05-16 DIAGNOSIS — K219 Gastro-esophageal reflux disease without esophagitis: Secondary | ICD-10-CM | POA: Diagnosis not present

## 2016-05-16 DIAGNOSIS — J440 Chronic obstructive pulmonary disease with acute lower respiratory infection: Secondary | ICD-10-CM | POA: Diagnosis not present

## 2016-05-16 DIAGNOSIS — J189 Pneumonia, unspecified organism: Secondary | ICD-10-CM | POA: Diagnosis not present

## 2016-05-16 DIAGNOSIS — J441 Chronic obstructive pulmonary disease with (acute) exacerbation: Secondary | ICD-10-CM | POA: Diagnosis not present

## 2016-05-16 DIAGNOSIS — I10 Essential (primary) hypertension: Secondary | ICD-10-CM | POA: Diagnosis not present

## 2016-05-16 DIAGNOSIS — F341 Dysthymic disorder: Secondary | ICD-10-CM | POA: Diagnosis not present

## 2016-05-22 ENCOUNTER — Other Ambulatory Visit: Payer: Self-pay | Admitting: *Deleted

## 2016-05-22 NOTE — Patient Outreach (Signed)
Final transition of care call. Pt is doing OK. She says nothing has changed and nothing is new. She is not getting PT any longer but she is still getting a once a week home health nursing visit. She says her breathing is normal for her and her weight is holding steady. I asked if I can see her next week and she agrees to a home visit on Thursday, November 8th.  Tamara Adams [facility] [place] [phone]

## 2016-05-23 DIAGNOSIS — F341 Dysthymic disorder: Secondary | ICD-10-CM | POA: Diagnosis not present

## 2016-05-23 DIAGNOSIS — J189 Pneumonia, unspecified organism: Secondary | ICD-10-CM | POA: Diagnosis not present

## 2016-05-23 DIAGNOSIS — J441 Chronic obstructive pulmonary disease with (acute) exacerbation: Secondary | ICD-10-CM | POA: Diagnosis not present

## 2016-05-23 DIAGNOSIS — K219 Gastro-esophageal reflux disease without esophagitis: Secondary | ICD-10-CM | POA: Diagnosis not present

## 2016-05-23 DIAGNOSIS — I10 Essential (primary) hypertension: Secondary | ICD-10-CM | POA: Diagnosis not present

## 2016-05-23 DIAGNOSIS — J440 Chronic obstructive pulmonary disease with acute lower respiratory infection: Secondary | ICD-10-CM | POA: Diagnosis not present

## 2016-05-30 DIAGNOSIS — K219 Gastro-esophageal reflux disease without esophagitis: Secondary | ICD-10-CM | POA: Diagnosis not present

## 2016-05-30 DIAGNOSIS — J440 Chronic obstructive pulmonary disease with acute lower respiratory infection: Secondary | ICD-10-CM | POA: Diagnosis not present

## 2016-05-30 DIAGNOSIS — J441 Chronic obstructive pulmonary disease with (acute) exacerbation: Secondary | ICD-10-CM | POA: Diagnosis not present

## 2016-05-30 DIAGNOSIS — I10 Essential (primary) hypertension: Secondary | ICD-10-CM | POA: Diagnosis not present

## 2016-05-30 DIAGNOSIS — F341 Dysthymic disorder: Secondary | ICD-10-CM | POA: Diagnosis not present

## 2016-05-30 DIAGNOSIS — J189 Pneumonia, unspecified organism: Secondary | ICD-10-CM | POA: Diagnosis not present

## 2016-05-31 ENCOUNTER — Other Ambulatory Visit: Payer: Self-pay | Admitting: Family Medicine

## 2016-05-31 ENCOUNTER — Other Ambulatory Visit: Payer: Self-pay | Admitting: *Deleted

## 2016-05-31 MED ORDER — AMBULATORY NON FORMULARY MEDICATION: 3 refills | Status: DC

## 2016-05-31 MED ORDER — HYDROCODONE-ACETAMINOPHEN 10-325 MG PO TABS: 1.0000 | ORAL_TABLET | Freq: Four times a day (QID) | ORAL | 0 refills | Status: DC | PRN

## 2016-05-31 NOTE — Patient Outreach (Addendum)
[facility]) Care Management   05/31/2016  Tamara Adams [birth year] [id number]  Tamara Adams is an 75 y.o. female  Subjective: Pt has Tracy!!!!! Has not smoked since her Sept hosptialization. Also she has gained a few pounds. She is taking a GI cocktail which intially seemed to help a lot but the effectiveness has dissipated somewhat but still worth taking. Insurance will not pay for this. ($160/month) Home health has discharged pt.  She reports that [name] wants her to collect a urine specimen as she has had hematuria in the past.  Objective:   Review of Systems  HENT: Negative.   Eyes: Negative.   Respiratory: Positive for shortness of breath.   Cardiovascular: Negative.   Gastrointestinal: Positive for abdominal pain and constipation.  Genitourinary: Positive for hematuria.       Hx of hematuria.  Musculoskeletal: Negative.   Skin: Negative.   Neurological: Positive for weakness.  Endo/Heme/Allergies: Negative.   Psychiatric/Behavioral: Negative.    BP 140/70 (BP Location: Left Arm, Patient Position: Sitting, Cuff Size: Small)   Pulse 62   Resp 20   Wt 80 lb (36.3 kg)   SpO2 98%   BMI 13.73 kg/m   Physical Exam  Cardiovascular: Normal rate, regular rhythm and normal heart sounds.   Respiratory: Effort normal and breath sounds normal.  GI: Soft. Bowel sounds are normal.  Musculoskeletal: Normal range of motion.  Neurological: She is alert.  Skin: Skin is warm and dry.  Psychiatric: She has a normal mood and affect.    Encounter Medications:   Outpatient Encounter Prescriptions as of 05/31/2016  Medication Sig Note  . acetaminophen (TYLENOL) 325 MG tablet Take 325-650 mg by mouth every 6 (six) hours as needed for headache. 03/30/2016: Takes this occasionally  . albuterol (PROVENTIL) (2.5 MG/3ML) 0.083% nebulizer solution USE 1 VIAL (3 ML) VIA NEBULIZER EVERY 6 HOURS AS NEEDED FOR WHEEZING OR SHORTNESS OF BREATH (Patient taking differently: USE  1 VIAL (3 ML) VIA NEBULIZER THREE TIMES DAILY)   . AMBULATORY NON FORMULARY MEDICATION Medication Name: GI cocktail: 170m Lidocaine2%, 1071mMaalox, 5048monnatol   . amLODipine (NORVASC) 5 MG tablet TAKE 1 TABLET DAILY   . aspirin EC 81 MG tablet Take 81 mg by mouth daily.   . EMarland KitchenSURE PLUS (ENSURE PLUS) LIQD Take 237 mLs by mouth See admin instructions. Two to three times a day to cause weight GAIN   . fluticasone furoate-vilanterol (BREO ELLIPTA) 100-25 MCG/INH AEPB USE 1 INHALATION DAILY (Patient taking differently: Inhale 1 puff into the lungs every evening. )   . guaiFENesin (MUCINEX) 600 MG 12 hr tablet Take 1 tablet (600 mg total) by mouth 2 (two) times daily.   . HMarland KitchenDROcodone-acetaminophen (NORCO) 10-325 MG tablet Take 1 tablet by mouth every 6 (six) hours as needed.   . KMarland KitchenOR-CON M10 10 MEQ tablet Take 1 tablet (10 mEq total) by mouth daily.   . LMarland KitchenRazepam (ATIVAN) 1 MG tablet Take 1 tablet (1 mg total) by mouth daily as needed for anxiety or sleep. (Patient taking differently: Take 1 mg by mouth at bedtime. FOR SLEEP)   . losartan (COZAAR) 100 MG tablet Take 1 tablet (100 mg total) by mouth daily.   . magnesium oxide (MAG-OX) 400 (241.3 Mg) MG tablet Take 1 tablet (400 mg total) by mouth daily.   . metoprolol succinate (TOPROL-XL) 50 MG 24 hr tablet Take 1 tablet (50 mg total) by mouth daily.   . Multiple Vitamin (MULTIVITAMIN WITH MINERALS) TABS tablet  Take 1 tablet by mouth daily. Pt uses One-A-Day brand   . nicotine (NICOTROL) 10 MG inhaler Inhale 10 mg into the lungs 2 (two) times daily as needed for smoking cessation.  03/30/2016: This was last used in the past 2 months; is kept at home  . ondansetron (ZOFRAN-ODT) 8 MG disintegrating tablet TAKE 1 TABLET EVERY 8 HOURS AS NEEDED FOR NAUSEA (Patient taking differently: Take 8 mg by mouth every 8 (eight) hours as needed for nausea. TAKE 1 TABLET EVERY 8 HOURS AS NEEDED FOR NAUSEA)   . OXYGEN Inhale 4 L into the lungs continuous.   Marland Kitchen  PARoxetine (PAXIL) 20 MG tablet Take 1 tablet (20 mg total) by mouth daily.   Marland Kitchen PROAIR HFA 108 (90 Base) MCG/ACT inhaler USE 2 TO 4 INHALATIONS EVERY 4 TO 6 HOURS AS NEEDED (Patient taking differently: USE 2 TO 4 INHALATIONS EVERY 4 TO 6 HOURS AS NEEDED FOR SHORTNESS OF BREATH/WHEEZING)   . promethazine (PHENERGAN) 25 MG tablet Take 12.5 mg by mouth every 8 (eight) hours as needed for nausea.  03/30/2016: Zofran seems to work better for the patient  . sucralfate (CARAFATE) 1 GM/10ML suspension Take 10 mLs (1 g total) by mouth 4 (four) times daily -  with meals and at bedtime.   . traMADol (ULTRAM) 50 MG tablet Take 1 tablet (50 mg total) by mouth every 6 (six) hours as needed (pain).   Marland Kitchen Umeclidinium Bromide (INCRUSE ELLIPTA) 62.5 MCG/INH AEPB Inhale 1 Inhaler into the lungs daily. (Patient taking differently: Inhale 1 puff into the lungs every morning. )   . dicyclomine (BENTYL) 20 MG tablet Take 1 tablet (20 mg total) by mouth 4 (four) times daily -  before meals and at bedtime. (Patient not taking: Reported on 05/31/2016)   . ERYPED 200 200 MG/5ML suspension  05/04/2016: Received from: External Pharmacy  . mirtazapine (REMERON) 15 MG tablet  04/20/2016: Received from: External Pharmacy  . omeprazole (PRILOSEC) 40 MG capsule Take 1 capsule (40 mg total) by mouth 2 (two) times daily. (Patient not taking: Reported on 05/31/2016)   . predniSONE (DELTASONE) 20 MG tablet After 4 days decrease to half a tab for 4 days. (Patient not taking: Reported on 05/31/2016)    No facility-administered encounter medications on file as of 05/31/2016.     Functional Status:   In your present state of health, do you have any difficulty performing the following activities: 03/30/2016 03/25/2016  Hearing? N N  Vision? Y Y  Difficulty concentrating or making decisions? N N  Walking or climbing stairs? Y N  Dressing or bathing? N N  Doing errands, shopping? Y Y  Preparing Food and eating ? - -  Using the Toilet? - -  In the  past six months, have you accidently leaked urine? - -  Do you have problems with loss of bowel control? - -  Managing your Medications? - -  Managing your Finances? - -  Housekeeping or managing your Housekeeping? - -  Some recent data might be hidden    Fall/Depression Screening:    PHQ 2/9 Scores 05/31/2016 12/16/2015 12/12/2015 02/08/2014 09/25/2012  PHQ - 2 Score 2 2 0 0 0  PHQ- 9 Score 4 10 - - -    Assessment:  IBS                         COPD  Plan:  Will ask [facility] pharmacist if any ideas on getting GI coctail  for less. Pt is paying out of pocket ($40 or 16 oz and is             taking 1 oz bid = 1 week of medication)  **[name], could pt's hematuria be from vaginal atrophy? Consider estrogen cream if appropriate.  I will call pt in 2 weeks and see her again in 1 month.  THN CM Care Plan Problem One   Flowsheet Row Most Recent Value  Care Plan Problem One  Low weight and abdominal pain  Role Documenting the Problem One  Care Management [place] for Problem One  Active  THN Long Term Goal (31-90 days)  Pt will maintain weight or gain =>80# over the next 90 days  THN Long Term Goal Start Date  06/01/16  THN Long Term Goal Met Date  05/31/16  Interventions for Problem One Long Term Goal  Encouraged continued supplementation with Ensure and routine dosing of her GI cocktail. Will ask [place] if any ideas for pt to save $ on this.  THN CM Short Term Goal #1 (0-30 days)  Pt will weigh weekly and record for NP.  THN CM Short Term Goal #1 Start Date  04/04/16  [facility] CM Short Term Goal #1 Met Date  05/22/16  Interventions for Short Term Goal #1  Encouraged her to continue this daily action for self awareness.    [facility] CM Care Plan Problem Two   Flowsheet Row Most Recent Value  Care Plan Problem Two  COPD  Role Documenting the Problem Two  Care Management Coordinator  Care Plan for Problem Two  Active  Interventions for Problem Two Long Term Goal   Praised for her  great accomplishment!  She has done the best thing she could possibly do for the control of her COPD! Encouraged continued use of her nicotine inhaler.  THN Long Term Goal (31-90) days  Pt to follow her COPD action plan and call for problems over the next 31 days.  THN Long Term Goal Start Date  06/01/16  THN Long Term Goal Met Date  05/22/16  THN CM Short Term Goal #1 (0-30 days)  Pt to maintain her abstenance over the next 30 days.  THN CM Short Term Goal #1 Start Date  06/01/16  [facility] CM Short Term Goal #1 Met Date   05/22/16  Interventions for Short Term Goal #2   Celebrated her smoking cessation! Encouraged her continuance of same.     Deloria Lair [facility] [phone]

## 2016-06-01 ENCOUNTER — Telehealth: Payer: Self-pay

## 2016-06-01 NOTE — Telephone Encounter (Signed)
Ok to send  phenergan '25mg'$  1/2-1 tablet every 6 hours as needed for nausea #30 to pharmacy.

## 2016-06-01 NOTE — Addendum Note (Signed)
Addended by: Deloria Lair on: 06/01/2016 01:40 PM   Modules accepted: Orders

## 2016-06-01 NOTE — Telephone Encounter (Signed)
Tamara Adams requested a refill on phenergan 25 mg 1/2 tablet as needed for nausea. The phenergan is on her medication list but it is historical . She states she takes it for her chronic nausea. Please advise.

## 2016-06-04 MED ORDER — TRAMADOL HCL 50 MG PO TABS: 50.0000 mg | ORAL_TABLET | Freq: Four times a day (QID) | ORAL | 3 refills | Status: DC | PRN

## 2016-06-04 MED ORDER — FLUTICASONE FUROATE-VILANTEROL 100-25 MCG/INH IN AEPB: INHALATION_SPRAY | RESPIRATORY_TRACT | 2 refills | Status: DC

## 2016-06-04 MED ORDER — UMECLIDINIUM BROMIDE 62.5 MCG/INH IN AEPB: 1.0000 | INHALATION_SPRAY | Freq: Every day | RESPIRATORY_TRACT | 99 refills | Status: DC

## 2016-06-04 MED ORDER — PROMETHAZINE HCL 25 MG PO TABS: 12.5000 mg | ORAL_TABLET | Freq: Three times a day (TID) | ORAL | 0 refills | Status: DC | PRN

## 2016-06-04 NOTE — Telephone Encounter (Signed)
Medication sent.

## 2016-06-04 NOTE — Telephone Encounter (Signed)
Patient advised.

## 2016-06-06 ENCOUNTER — Other Ambulatory Visit: Payer: Self-pay

## 2016-06-06 MED ORDER — PAROXETINE HCL 20 MG PO TABS: 20.0000 mg | ORAL_TABLET | Freq: Every day | ORAL | 0 refills | Status: DC

## 2016-06-07 ENCOUNTER — Ambulatory Visit: Payer: Medicare Other | Admitting: Adult Health

## 2016-06-10 ENCOUNTER — Other Ambulatory Visit: Payer: Self-pay | Admitting: Family Medicine

## 2016-06-11 ENCOUNTER — Other Ambulatory Visit: Payer: Self-pay | Admitting: *Deleted

## 2016-06-11 MED ORDER — ONDANSETRON 8 MG PO TBDP: ORAL_TABLET | ORAL | 1 refills | Status: DC

## 2016-06-11 MED ORDER — PROMETHAZINE HCL 25 MG PO TABS: 12.5000 mg | ORAL_TABLET | Freq: Three times a day (TID) | ORAL | 0 refills | Status: DC | PRN

## 2016-06-11 NOTE — Progress Notes (Signed)
rx for phenergan, zofran sent to [facility] pharmacy.Audelia Hives [place]

## 2016-06-11 NOTE — Patient Outreach (Signed)
Telephone call from patient.  Mrs. Tamara Adams reports increased SOB with some production of greenish sputum. She reports she has not been taking her mucinex as we discussed last week she needs to be doing, nor has she been very good about taking her nebulizer treatments. She says she usually may get an Rx of doxycyline and a steroid taper for this. I advised her I would review her chart and get back to her.  PLAN: I have advised pt to PLEASE start taking the mucinex routinely and take her nebulizer.  I wonder if starting a long acting nebulized bronchodilator would be beneficial?  I called Dr. Gardiner Ramus office, got voice mail, office closed for lunch. I am sending this note and will call her later.  Deloria Lair [facility] [place] [phone]

## 2016-06-12 ENCOUNTER — Other Ambulatory Visit: Payer: Self-pay | Admitting: *Deleted

## 2016-06-12 ENCOUNTER — Telehealth: Payer: Self-pay

## 2016-06-12 DIAGNOSIS — Z7951 Long term (current) use of inhaled steroids: Secondary | ICD-10-CM | POA: Diagnosis not present

## 2016-06-12 DIAGNOSIS — Z7952 Long term (current) use of systemic steroids: Secondary | ICD-10-CM | POA: Diagnosis not present

## 2016-06-12 DIAGNOSIS — Z79891 Long term (current) use of opiate analgesic: Secondary | ICD-10-CM | POA: Diagnosis not present

## 2016-06-12 DIAGNOSIS — Z79899 Other long term (current) drug therapy: Secondary | ICD-10-CM | POA: Diagnosis not present

## 2016-06-12 DIAGNOSIS — K2900 Acute gastritis without bleeding: Secondary | ICD-10-CM | POA: Diagnosis not present

## 2016-06-12 MED ORDER — DOXYCYCLINE HYCLATE 100 MG PO TABS: 100.0000 mg | ORAL_TABLET | Freq: Two times a day (BID) | ORAL | 0 refills | Status: DC

## 2016-06-12 MED ORDER — PREDNISONE 20 MG PO TABS: 40.0000 mg | ORAL_TABLET | Freq: Every day | ORAL | 0 refills | Status: DC

## 2016-06-12 NOTE — Telephone Encounter (Signed)
Perception for doxycycline and prednisone burst sent to [place].

## 2016-06-12 NOTE — Telephone Encounter (Signed)
Left VM for Arbie Cookey advising of new Rx's. Callback provided for any additional questions.

## 2016-06-12 NOTE — Telephone Encounter (Signed)
Bradd Canary, NP-Care Manager, called and reports Tamara Adams has a worsening of shortness of breath and productive cough with green sputum. Denies fever, chills and sweats. She wanted to know if she could get an antibiotic and prednisone dose pack. Please advise.

## 2016-06-12 NOTE — Patient Outreach (Signed)
Telephone assessment. Called Mrs. Simmers to advise that I had a call from Dr. Gardiner Ramus office and she has called in 2 prescriptions for pt: doxycycline and prednisone. She was very appreciative of the care coordination. She reports she is not feeling any better.  I reinforced to her she must take her respiratory meds/nebs routinely, and avoid smoking. I have asked her to call me if she has any worsening of sxs.  I will call her on Friday to follow up.  Deloria Lair [facility] [phone]

## 2016-06-13 ENCOUNTER — Ambulatory Visit: Payer: Self-pay | Admitting: *Deleted

## 2016-06-15 ENCOUNTER — Other Ambulatory Visit: Payer: Self-pay | Admitting: *Deleted

## 2016-06-15 NOTE — Patient Outreach (Signed)
Telephone assessment. Pt reports it has been a rough week. She reveals that her husband was in the hospital this week and that was stressful on top of her own COPD exacerbation. She is taking the antibiotic and steroid that Dr. Madilyn Fireman ordered. She reports she is taking all her respiratoru meds as ordered. She is taking mucinex also. She is using her Flutter Valve but maybe not as much as she should be right now.  I have encouraged her to use the flutter valve, especially after her nebs tx.  I asked her to not overdo it, trying to look after her husband. She should ask their grand daughter that lives with them to do things for both of them.  I will call her again on Monday, to ensure she is continuing to improve.  Tamara Adams [facility] [place] [phone]

## 2016-06-18 ENCOUNTER — Other Ambulatory Visit: Payer: Self-pay | Admitting: *Deleted

## 2016-06-19 ENCOUNTER — Ambulatory Visit (HOSPITAL_BASED_OUTPATIENT_CLINIC_OR_DEPARTMENT_OTHER): Payer: Medicare Other | Attending: Family Medicine | Admitting: Internal Medicine

## 2016-06-19 VITALS — Ht 63.5 in | Wt 80.0 lb

## 2016-06-19 DIAGNOSIS — J438 Other emphysema: Secondary | ICD-10-CM | POA: Diagnosis not present

## 2016-06-19 DIAGNOSIS — R5383 Other fatigue: Secondary | ICD-10-CM | POA: Diagnosis not present

## 2016-06-19 DIAGNOSIS — G4736 Sleep related hypoventilation in conditions classified elsewhere: Secondary | ICD-10-CM | POA: Insufficient documentation

## 2016-06-19 DIAGNOSIS — Z79899 Other long term (current) drug therapy: Secondary | ICD-10-CM | POA: Insufficient documentation

## 2016-06-19 DIAGNOSIS — R0683 Snoring: Secondary | ICD-10-CM | POA: Insufficient documentation

## 2016-06-19 NOTE — Patient Outreach (Signed)
Follow up telephone assessment. Pt has finished up her steroid Rx and is still taking her antibiotic. She says she wishes she could still be on the steroid as this makes her feel so much better. She says she is not feeling worse at all. We discussed that perhaps she needs to have a visit with her pulmonologist to see if they have any further suggestions to help her. I see she has an appointment with Carolynn Serve, NP at [facility] after having had her sleep study. I will encouraged her to discuss her regimen and her feelings about her control with her.  Encouraged her continued tobacco abstinence! Encouraged routine use of her nebs, inhaler and flutter valve.  I will check in with pt next week by phone.  Deloria Lair [facility] [place] [phone]

## 2016-06-23 NOTE — Procedures (Signed)
  Patient Name: Tamara Adams, Tamara Adams Date: 06/19/2016 Gender: Female D.O.B: 1941/05/03 Age (years): 74 Referring Provider: Beatrice Lecher Height (inches): 64 Interpreting Physician: Baird Lyons MD, ABSM Weight (lbs): 80 RPSGT: Zadie Rhine BMI: 14 MRN: [id number] Neck Size: 12.00 CLINICAL INFORMATION Sleep Study Type: NPSG   Indication for sleep study: Fatigue, Snoring   Epworth Sleepiness Score:  16   SLEEP STUDY TECHNIQUE As per the AASM Manual for the Scoring of Sleep and Associated Events v2.3 (April 2016) with a hypopnea requiring 4% desaturations. The channels recorded and monitored were frontal, central and occipital EEG, electrooculogram (EOG), submentalis EMG (chin), nasal and oral airflow, thoracic and abdominal wall motion, anterior tibialis EMG, snore microphone, electrocardiogram, and pulse oximetry.  MEDICATIONS Medications self-administered by patient taken the night of the study : BREO ELLIPTA, [place] The study was initiated at 10:02:29 PM and ended at 4:37:16 AM. Sleep onset time was 25.6 minutes and the sleep efficiency was 88.8%. The total sleep time was 350.5 minutes. Stage REM latency was 49.0 minutes. The patient spent 6.56% of the night in stage N1 sleep, 72.33% in stage N2 sleep, 1.57% in stage N3 and 19.54% in REM. Alpha intrusion was absent. Supine sleep was 97.00%.  RESPIRATORY PARAMETERS The overall apnea/hypopnea index (AHI) was 0.3 per hour. There were 2 total apneas, including 2 obstructive, 0 central and 0 mixed apneas. There were 0 hypopneas and 22 RERAs. The AHI during Stage REM sleep was 0.9 per hour. AHI while supine was 0.4 per hour. The mean oxygen saturation was 98.77%. The minimum SpO2 during sleep was 98.00%. Study recorded on 4.5 L/M O2 - home dose, per protocol. snoring was noted during this study.  CARDIAC DATA The 2 lead EKG demonstrated sinus rhythm. The mean heart rate was 67.83 beats per minute.  Other EKG findings include: PVCs.  LEG MOVEMENT DATA The total PLMS were 0 with a resulting PLMS index of 0.00. Associated arousal with leg movement index was 0.0 .    IMPRESSIONS - No significant obstructive sleep apnea occurred during this study (AHI = 0.3/h). - No significant central sleep apnea occurred during this study (CAI = 0.0/h). - Mild oxygen desaturation was noted during this study (Min O2 = 98.00%). Study recorded on 4.5 L/M O2 - home dose, per protocol. - No snoring was audible during this study. - EKG findings include PVCs. - Clinically significant periodic limb movements did not occur during sleep. No significant associated arousals.  DIAGNOSIS - Nocturnal Hypoxemia (327.26 [G47.36 ICD-10])  RECOMMENDATIONS - Avoid alcohol, sedatives and other CNS depressants that may worsen sleep apnea and disrupt normal sleep architecture. - Sleep hygiene should be reviewed to assess factors that may improve sleep quality. - Weight management and regular exercise should be initiated or continued if appropriate.  [Electronically signed] 06/23/2016 12:11 PM  Baird Lyons MD, [place], American Board of Sleep Medicine   NPI: [id number]  [place], American Board of Sleep Medicine  ELECTRONICALLY SIGNED ON:  06/23/2016, 12:06 PM [place] PH: [phone]   FX: [phone] [place]

## 2016-06-25 ENCOUNTER — Other Ambulatory Visit: Payer: Self-pay | Admitting: *Deleted

## 2016-06-25 NOTE — Patient Outreach (Signed)
Telephone assessment. Pt reports she is doing fair. She has completed her antibiotic treatment. She feels stable but not great. We have an appt scheduled for next Tuesday evening. I will see her then. She knows to call me if she has any set backs/  Tamara Adams [facility] [place] [phone]

## 2016-06-28 ENCOUNTER — Encounter: Payer: Self-pay | Admitting: Adult Health

## 2016-06-28 ENCOUNTER — Ambulatory Visit (INDEPENDENT_AMBULATORY_CARE_PROVIDER_SITE_OTHER): Payer: Medicare Other | Admitting: Adult Health

## 2016-06-28 DIAGNOSIS — J449 Chronic obstructive pulmonary disease, unspecified: Secondary | ICD-10-CM

## 2016-06-28 MED ORDER — FLUTICASONE FUROATE-VILANTEROL 100-25 MCG/INH IN AEPB: 1.0000 | INHALATION_SPRAY | Freq: Every day | RESPIRATORY_TRACT | 0 refills | Status: DC

## 2016-06-28 MED ORDER — UMECLIDINIUM BROMIDE 62.5 MCG/INH IN AEPB: 1.0000 | INHALATION_SPRAY | Freq: Every day | RESPIRATORY_TRACT | 0 refills | Status: DC

## 2016-06-28 NOTE — Progress Notes (Signed)
Subjective:    Patient ID: Tamara Adams, female    DOB: Jun 17, 1941, 75 y.o.   MRN: [id number]  HPI 75 yo female smoker with COPD O2 dependent  Hx of bowel obstruction and chronic malnutrition  TEST  Spirometry 09/2010 showed FEV1 of 0.70-29%, ratio 32  CT angio chest 12/2014 was negative for pulmonary embolism and showed mild nodular densities in both lung bases with severe emphysema and hyperinflation. CXR  06/2015 COPC change w/ nad.   06/28/2016 Follow up : COPD /O2 dependent  Returns for 9  month follow up . 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 Previous CT chest with severe emphysema . She remains on Incruse/BREO . She says she has good and bad days. Gets winded easily .  Was treated for PNA in Sept . follow up cxr showed resolution .   PVX , Prevnar , TDAP utd  Flu shot is needed.   Under a lot of stress , husband had stroke.     Past Medical History:  Diagnosis Date  . Anemia    as a child  . Arthritis    "hands" (12/24/2014)  . Complication of anesthesia    " My blood gas dropped during surgery so I was left on a ventilator and in ICU for 3 days."  . COPD (chronic obstructive pulmonary disease) (HCC)    FVC 72%, FEV1 29%, FEV1 ratio 32% (very severe (COPD)  . COPD (chronic obstructive pulmonary disease) ([place])   . Degenerative disc disease   . Depression with anxiety   . Diverticulosis   . DVT (deep venous thrombosis) ([place])    "LLE; years ago after a surgery"  . Essential hypertension   . Fluttering sensation of heart    pt put on metoprolol as a result  . GERD (gastroesophageal reflux disease)    PMH  . H/O hiatal hernia   . Headache    "maybe weekly" (12/24/2014)  . Hyperlipidemia   . Hypertension   . Kidney stone   . Liver lesion   . On home oxygen therapy    "3L; sleep w/it; use it when I rest" (12/24/2014)  . Peptic ulcer   . Pinched nerve    in back  . Pneumonia   . PONV (postoperative nausea and vomiting)   . Shortness of  breath dyspnea    with exertion  . Skipped heart beats   . Small bowel obstruction    "several times; OR twice" (12/24/2014)  . Tubular adenoma of colon 09/2011  . Wears glasses    Current Outpatient Prescriptions on File Prior to Visit  Medication Sig Dispense Refill  . acetaminophen (TYLENOL) 325 MG tablet Take 325-650 mg by mouth every 6 (six) hours as needed for headache.    . albuterol (PROVENTIL) (2.5 MG/3ML) 0.083% nebulizer solution USE 1 VIAL (3 ML) VIA NEBULIZER EVERY 6 HOURS AS NEEDED FOR WHEEZING OR SHORTNESS OF BREATH (Patient taking differently: USE 1 VIAL (3 ML) VIA NEBULIZER THREE TIMES DAILY) 810 mL 12  . AMBULATORY NON FORMULARY MEDICATION Medication Name: GI cocktail: 168m Lidocaine2%, 1055mMaalox, 50104monnatol 250 mL 3  . amLODipine (NORVASC) 5 MG tablet TAKE 1 TABLET DAILY 90 tablet 1  . aspirin EC 81 MG tablet Take 81 mg by mouth daily.    . EMarland KitchenSURE PLUS (ENSURE PLUS) LIQD Take 237 mLs by mouth See admin instructions. Two to three times a day to cause weight GAIN    .  fluticasone furoate-vilanterol (BREO ELLIPTA) 100-25 MCG/INH AEPB USE 1 INHALATION DAILY 180 each 2  . guaiFENesin (MUCINEX) 600 MG 12 hr tablet Take 1 tablet (600 mg total) by mouth 2 (two) times daily. 30 tablet 0  . HYDROcodone-acetaminophen (NORCO) 10-325 MG tablet Take 1 tablet by mouth every 6 (six) hours as needed. 60 tablet 0  . KLOR-CON M10 10 MEQ tablet Take 1 tablet (10 mEq total) by mouth daily. 90 tablet 2  . LORazepam (ATIVAN) 1 MG tablet Take 1 tablet (1 mg total) by mouth daily as needed for anxiety or sleep. (Patient taking differently: Take 1 mg by mouth at bedtime. FOR SLEEP) 90 tablet 1  . losartan (COZAAR) 100 MG tablet TAKE 1 TABLET DAILY 90 tablet 0  . magnesium oxide (MAG-OX) 400 (241.3 Mg) MG tablet Take 1 tablet (400 mg total) by mouth daily. 30 tablet 0  . metoprolol succinate (TOPROL-XL) 50 MG 24 hr tablet Take 1 tablet (50 mg total) by mouth daily. 90 tablet 1  . Multiple Vitamin  (MULTIVITAMIN WITH MINERALS) TABS tablet Take 1 tablet by mouth daily. Pt uses One-A-Day brand    . ondansetron (ZOFRAN-ODT) 8 MG disintegrating tablet TAKE 1 TABLET EVERY 8 HOURS AS NEEDED FOR NAUSEA 90 tablet 1  . OXYGEN Inhale 4 L into the lungs continuous.    Marland Kitchen PROAIR HFA 108 (90 Base) MCG/ACT inhaler USE 2 TO 4 INHALATIONS EVERY 4 TO 6 HOURS AS NEEDED 25.5 g 1  . promethazine (PHENERGAN) 25 MG tablet Take 0.5 tablets (12.5 mg total) by mouth every 8 (eight) hours as needed for nausea. 30 tablet 0  . sucralfate (CARAFATE) 1 GM/10ML suspension Take 10 mLs (1 g total) by mouth 4 (four) times daily -  with meals and at bedtime. 420 mL 0  . traMADol (ULTRAM) 50 MG tablet Take 1 tablet (50 mg total) by mouth every 6 (six) hours as needed (pain). 90 tablet 3  . umeclidinium bromide (INCRUSE ELLIPTA) 62.5 MCG/INH AEPB Inhale 1 puff into the lungs daily. 90 each PRN  . omeprazole (PRILOSEC) 40 MG capsule Take 1 capsule (40 mg total) by mouth 2 (two) times daily. (Patient not taking: Reported on 06/28/2016) 60 capsule 3  . PARoxetine (PAXIL) 20 MG tablet Take 1 tablet (20 mg total) by mouth daily. Patient needs to schedule a follow up appointment with PCP before more refills. (Patient not taking: Reported on 06/28/2016) 90 tablet 0   No current facility-administered medications on file prior to visit.      Review of Systems Constitutional:   No  weight loss, night sweats,  Fevers, chills + fatigue, or  lassitude.  HEENT:   No headaches,  Difficulty swallowing,  Tooth/dental problems, or  Sore throat,                No sneezing, itching, ear ache, nasal congestion, post nasal drip,   CV:  No chest pain,  Orthopnea, PND, swelling in lower extremities, anasarca, dizziness, palpitations, syncope.   GI  No   bloody stools.   Resp:  No chest wall deformity  Skin: no rash or lesions.  GU: no dysuria, change in color of urine, no urgency or frequency.  No flank pain, no hematuria   MS:  No joint  pain or swelling.  No decreased range of motion.  No back pain.  Psych:  No change in mood or affect. No depression or anxiety.  No memory loss.         Objective:  Physical Exam Vitals:   06/28/16 1203  BP: (!) 142/68  Pulse: 83  SpO2: 95%  Weight: 82 lb 12.8 oz (37.6 kg)  Height: 5' 3.5" (1.613 m)   GEN: A/Ox3; pleasant , NAD, thin frail    HEENT:  [place]/AT,  EACs-clear, TMs-wnl, NOSE-clear, THROAT-clear, no lesions, no postnasal drip or exudate noted.   NECK:  Supple w/ fair ROM; no JVD; normal carotid impulses w/o bruits; no thyromegaly or nodules palpated; no lymphadenopathy.    RESP  Decreased BS in bases ,  no accessory muscle use, no dullness to percussion  CARD:  RRR, no m/r/g  , no peripheral edema, pulses intact, no cyanosis or clubbing.  GI:   Soft & nt; nml bowel sounds; no organomegaly or masses detected.   Musco: Warm bil, no deformities or joint swelling noted.   Neuro: alert, no focal deficits noted.    Skin: Warm, no lesions or rashes     Wai Litt NP-C   Pulmonary and Critical Care  06/29/2016

## 2016-06-28 NOTE — Patient Instructions (Addendum)
Mucinex DM Twice daily  .As needed  Cough/congestion -would use kid dose .  Continue on INCRUSE/BREO -rinse after use.  Continue on Oxygen .  Flu shot today  follow up Dr. Elsworth Soho  In 6 months and As needed   Please contact office for sooner follow up if symptoms do not improve or worsen or seek emergency care

## 2016-06-29 NOTE — Assessment & Plan Note (Signed)
Patient Instructions  Mucinex DM Twice daily  .As needed  Cough/congestion -would use kid dose .  Continue on INCRUSE/BREO -rinse after use.  Continue on Oxygen .  Flu shot today  follow up Dr. Elsworth Soho  In 6 months and As needed   Please contact office for sooner follow up if symptoms do not improve or worsen or seek emergency care

## 2016-07-03 ENCOUNTER — Other Ambulatory Visit: Payer: Self-pay | Admitting: *Deleted

## 2016-07-03 NOTE — Patient Outreach (Signed)
[facility]) Care Management  07/03/2016  Tamara Adams [birth year] [id number]   Routine home visit. [name] has a new cold for 3 days. Stuffy, scratchy throat, runny nose. She is taking APAP and mucinex for her sxs. She now has donnatal liquid to add to her GI coctail and she says she feels like it makes a difference. She has lost her appetite and has lost weight. She is abstaining again from smoking.  Her husband was in the hospital for UTI and he has declined severely. He has an aide a few days a week. They are going to need more help at home and are talking with [place] about in home care. I have an acquaintance with would be available to help them. 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  O: BP (!) 160/80 (BP Location: Left Arm, Patient Position: Sitting, Cuff Size: Small)   Pulse 68   Resp 18   Wt 77 lb (34.9 kg)   SpO2 96% Comment: O2 at 4L  BMI 13.43 kg/m       RRR      Lungs are clear  A:  COPD      Viral infection  P:  Encouraged fluid, take mucinex, take APAP. Avoid smoking. Call NP if problems worsen!      I will see pt again in January.  THN CM Care Plan Problem One   Flowsheet Row Most Recent Value  Care Plan Problem One  (P) Low weight and abdominal pain  Role Documenting the Problem One  (P) Care Management [place] for Problem One  (P) Active  THN Long Term Goal (31-90 days)  (P) Pt will maintain weight or gain =>80# over the next 90 days  THN Long Term Goal Start Date  (P) 06/01/16  Interventions for Problem One Long Term Goal  (P) Encouraged continued supplementation with Ensure and routine dosing of her GI cocktail. Will ask [place] if any ideas for pt to save $ on this.    THN CM Care Plan Problem Two   Flowsheet Row Most Recent Value  Care Plan Problem Two  (P) COPD  Role Documenting the Problem Two  (P) Care Management Coordinator  Care Plan for Problem Two  (P) Active  Interventions for Problem Two Long Term Goal    (P) Advised pt that Dr. Madilyn Fireman wants her to use her flutter valve more often, she says she will.  THN Long Term Goal (31-90) days  (P) Pt to follow her COPD action plan and call for problems over the next 31 days.  THN Long Term Goal Start Date  (P) 06/01/16  THN CM Short Term Goal #1 (0-30 days)  (P) Pt to maintain her abstenance over the next 30 days.  THN CM Short Term Goal #1 Start Date  (P) 06/01/16     Deloria Lair [facility] [place] [phone]

## 2016-07-04 DIAGNOSIS — R062 Wheezing: Secondary | ICD-10-CM | POA: Diagnosis not present

## 2016-07-04 DIAGNOSIS — Z9981 Dependence on supplemental oxygen: Secondary | ICD-10-CM | POA: Diagnosis not present

## 2016-07-04 DIAGNOSIS — R05 Cough: Secondary | ICD-10-CM | POA: Diagnosis not present

## 2016-07-04 DIAGNOSIS — Z79899 Other long term (current) drug therapy: Secondary | ICD-10-CM | POA: Diagnosis not present

## 2016-07-04 DIAGNOSIS — Z90710 Acquired absence of both cervix and uterus: Secondary | ICD-10-CM | POA: Diagnosis not present

## 2016-07-04 DIAGNOSIS — Z7951 Long term (current) use of inhaled steroids: Secondary | ICD-10-CM | POA: Diagnosis not present

## 2016-07-04 DIAGNOSIS — I10 Essential (primary) hypertension: Secondary | ICD-10-CM | POA: Diagnosis present

## 2016-07-04 DIAGNOSIS — J9621 Acute and chronic respiratory failure with hypoxia: Secondary | ICD-10-CM | POA: Diagnosis not present

## 2016-07-04 DIAGNOSIS — J440 Chronic obstructive pulmonary disease with acute lower respiratory infection: Secondary | ICD-10-CM | POA: Diagnosis not present

## 2016-07-04 DIAGNOSIS — J189 Pneumonia, unspecified organism: Secondary | ICD-10-CM | POA: Diagnosis not present

## 2016-07-04 DIAGNOSIS — Z86718 Personal history of other venous thrombosis and embolism: Secondary | ICD-10-CM | POA: Diagnosis not present

## 2016-07-04 DIAGNOSIS — R109 Unspecified abdominal pain: Secondary | ICD-10-CM | POA: Diagnosis present

## 2016-07-04 DIAGNOSIS — G8929 Other chronic pain: Secondary | ICD-10-CM | POA: Diagnosis present

## 2016-07-04 DIAGNOSIS — J441 Chronic obstructive pulmonary disease with (acute) exacerbation: Secondary | ICD-10-CM | POA: Diagnosis not present

## 2016-07-04 DIAGNOSIS — D509 Iron deficiency anemia, unspecified: Secondary | ICD-10-CM | POA: Diagnosis present

## 2016-07-04 DIAGNOSIS — E43 Unspecified severe protein-calorie malnutrition: Secondary | ICD-10-CM | POA: Diagnosis not present

## 2016-07-04 DIAGNOSIS — Z885 Allergy status to narcotic agent status: Secondary | ICD-10-CM | POA: Diagnosis not present

## 2016-07-04 DIAGNOSIS — Z9049 Acquired absence of other specified parts of digestive tract: Secondary | ICD-10-CM | POA: Diagnosis not present

## 2016-07-04 DIAGNOSIS — J8410 Pulmonary fibrosis, unspecified: Secondary | ICD-10-CM | POA: Diagnosis not present

## 2016-07-04 DIAGNOSIS — R918 Other nonspecific abnormal finding of lung field: Secondary | ICD-10-CM | POA: Diagnosis not present

## 2016-07-04 DIAGNOSIS — R000 Tachycardia, unspecified: Secondary | ICD-10-CM | POA: Diagnosis present

## 2016-07-04 DIAGNOSIS — F17210 Nicotine dependence, cigarettes, uncomplicated: Secondary | ICD-10-CM | POA: Diagnosis present

## 2016-07-04 DIAGNOSIS — Z681 Body mass index (BMI) 19 or less, adult: Secondary | ICD-10-CM | POA: Diagnosis not present

## 2016-07-04 DIAGNOSIS — R0602 Shortness of breath: Secondary | ICD-10-CM | POA: Diagnosis not present

## 2016-07-04 DIAGNOSIS — Z888 Allergy status to other drugs, medicaments and biological substances status: Secondary | ICD-10-CM | POA: Diagnosis not present

## 2016-07-11 DIAGNOSIS — Z86718 Personal history of other venous thrombosis and embolism: Secondary | ICD-10-CM | POA: Diagnosis not present

## 2016-07-11 DIAGNOSIS — Z888 Allergy status to other drugs, medicaments and biological substances status: Secondary | ICD-10-CM | POA: Diagnosis not present

## 2016-07-11 DIAGNOSIS — R062 Wheezing: Secondary | ICD-10-CM | POA: Diagnosis not present

## 2016-07-11 DIAGNOSIS — F17210 Nicotine dependence, cigarettes, uncomplicated: Secondary | ICD-10-CM | POA: Diagnosis not present

## 2016-07-11 DIAGNOSIS — I10 Essential (primary) hypertension: Secondary | ICD-10-CM | POA: Diagnosis not present

## 2016-07-11 DIAGNOSIS — Z86010 Personal history of colonic polyps: Secondary | ICD-10-CM | POA: Diagnosis not present

## 2016-07-11 DIAGNOSIS — J984 Other disorders of lung: Secondary | ICD-10-CM | POA: Diagnosis not present

## 2016-07-11 DIAGNOSIS — Z87442 Personal history of urinary calculi: Secondary | ICD-10-CM | POA: Diagnosis not present

## 2016-07-11 DIAGNOSIS — Z79899 Other long term (current) drug therapy: Secondary | ICD-10-CM | POA: Diagnosis not present

## 2016-07-11 DIAGNOSIS — Z720 Tobacco use: Secondary | ICD-10-CM | POA: Diagnosis not present

## 2016-07-11 DIAGNOSIS — Z7982 Long term (current) use of aspirin: Secondary | ICD-10-CM | POA: Diagnosis not present

## 2016-07-11 DIAGNOSIS — R0602 Shortness of breath: Secondary | ICD-10-CM | POA: Diagnosis not present

## 2016-07-11 DIAGNOSIS — Z9981 Dependence on supplemental oxygen: Secondary | ICD-10-CM | POA: Diagnosis not present

## 2016-07-11 DIAGNOSIS — Z885 Allergy status to narcotic agent status: Secondary | ICD-10-CM | POA: Diagnosis not present

## 2016-07-11 DIAGNOSIS — J441 Chronic obstructive pulmonary disease with (acute) exacerbation: Secondary | ICD-10-CM | POA: Diagnosis not present

## 2016-07-24 ENCOUNTER — Telehealth: Payer: Self-pay | Admitting: Family Medicine

## 2016-07-24 NOTE — Telephone Encounter (Signed)
Pt called clinic today stating she has had a cough/congestion for the past 2 days. Pt reports with her COPD she is trying to get an Rx before she gets pneumonia. Pt states her cough is productive with a white phlegm. Pt reports no shortness of breath or breathing difficulties. Does state she had a fever of 101.8 yesterday, went down to 99.8 today. Advised an appointment was needed, Pt reports she "doesnt go out in the cold." will route to PCP for review.

## 2016-07-24 NOTE — Telephone Encounter (Signed)
Pt scheduled for appt tomorrow.

## 2016-07-24 NOTE — Telephone Encounter (Signed)
If she has a fever she could have the flu. Is there anyway at all someone can bring her?

## 2016-07-25 ENCOUNTER — Encounter: Payer: Self-pay | Admitting: Physician Assistant

## 2016-07-25 ENCOUNTER — Ambulatory Visit (INDEPENDENT_AMBULATORY_CARE_PROVIDER_SITE_OTHER): Payer: Medicare Other | Admitting: Physician Assistant

## 2016-07-25 VITALS — BP 179/68 | HR 117

## 2016-07-25 DIAGNOSIS — J441 Chronic obstructive pulmonary disease with (acute) exacerbation: Secondary | ICD-10-CM

## 2016-07-25 DIAGNOSIS — M51369 Other intervertebral disc degeneration, lumbar region without mention of lumbar back pain or lower extremity pain: Secondary | ICD-10-CM

## 2016-07-25 DIAGNOSIS — G894 Chronic pain syndrome: Secondary | ICD-10-CM | POA: Diagnosis not present

## 2016-07-25 DIAGNOSIS — M5136 Other intervertebral disc degeneration, lumbar region: Secondary | ICD-10-CM | POA: Diagnosis not present

## 2016-07-25 MED ORDER — PREDNISONE 20 MG PO TABS: ORAL_TABLET | ORAL | 0 refills | Status: DC

## 2016-07-25 MED ORDER — HYDROCODONE-ACETAMINOPHEN 10-325 MG PO TABS: 1.0000 | ORAL_TABLET | Freq: Four times a day (QID) | ORAL | 0 refills | Status: DC | PRN

## 2016-07-25 MED ORDER — CEFTRIAXONE SODIUM 1 G IJ SOLR
1.0000 g | Freq: Once | INTRAMUSCULAR | Status: AC
  Administered 2016-07-25: 1 g via INTRAMUSCULAR

## 2016-07-25 MED ORDER — CEFTRIAXONE SODIUM 1 G IJ SOLR: 1.0000 g | Freq: Once | INTRAMUSCULAR | 0 refills | Status: AC

## 2016-07-25 MED ORDER — DOXYCYCLINE HYCLATE 100 MG PO TABS: 100.0000 mg | ORAL_TABLET | Freq: Two times a day (BID) | ORAL | 0 refills | Status: DC

## 2016-07-25 MED ORDER — METHYLPREDNISOLONE SODIUM SUCC 125 MG IJ SOLR
125.0000 mg | Freq: Once | INTRAMUSCULAR | Status: AC
  Administered 2016-07-25: 125 mg via INTRAMUSCULAR

## 2016-07-25 NOTE — Patient Instructions (Signed)
Chronic Obstructive Pulmonary Disease Exacerbation  Chronic obstructive pulmonary disease (COPD) is a common lung problem. In COPD, the flow of air from the lungs is limited. COPD exacerbations are times that breathing gets worse and you need extra treatment. Without treatment they can be life threatening. If they happen often, your lungs can become more damaged. If your COPD gets worse, your doctor may treat you with:  ? Medicines.  ? Oxygen.  ? Different ways to clear your airway, such as using a mask.    Follow these instructions at home:  ? Do not smoke.  ? Avoid tobacco smoke and other things that bother your lungs.  ? If given, take your antibiotic medicine as told. Finish the medicine even if you start to feel better.  ? Only take medicines as told by your doctor.  ? Drink enough fluids to keep your pee (urine) clear or pale yellow (unless your doctor has told you not to).  ? Use a cool mist machine (vaporizer).  ? If you use oxygen or a machine that turns liquid medicine into a mist (nebulizer), continue to use them as told.  ? Keep up with shots (vaccinations) as told by your doctor.  ? Exercise regularly.  ? Eat healthy foods.  ? Keep all doctor visits as told.  Get help right away if:  ? You are very short of breath and it gets worse.  ? You have trouble talking.  ? You have bad chest pain.  ? You have blood in your spit (sputum).  ? You have a fever.  ? You keep throwing up (vomiting).  ? You feel weak, or you pass out (faint).  ? You feel confused.  ? You keep getting worse.  This information is not intended to replace advice given to you by your health care provider. Make sure you discuss any questions you have with your health care provider.  Document Released: 06/28/2011 Document Revised: 12/15/2015 Document Reviewed: 03/13/2013  Elsevier Interactive Patient Education ? 2017 Elsevier Inc.

## 2016-07-25 NOTE — Progress Notes (Signed)
Subjective:    Patient ID: Tamara Adams, female    DOB: 04/25/1941, 76 y.o.   MRN: [id number]  HPI  Patient is a 76yo female with a history of COPD is presenting to the clinic with a productive cough, fever, runny nose, and headache since Monday.  Patient reports her sputum color is typical color of white to green color but has increased in quantity since Monday.  Patient denies a sore throat, body aches, ear pain, or sinus pressure. She is increasing SOB. Patient reports she has continued using her nebulizer treatments 3 times per day and has tried Dylsem for her cough with mild relief.  Patient lives with her granddaughter who has similar symptoms.  Patient reports she did receive the flu shot.  Patient is on 5L of O2. Patient was in the ED July 04, 2016 for a COPD exacerbation where she was treated with neb treatments, prednisone, and a z pak/rocephin.          She asked for refill of norco today. Her PCP has been giving this to her for chronic back pain.    Review of Systems  Constitutional: Positive for fever.  HENT: Positive for postnasal drip and rhinorrhea. Negative for congestion, ear pain, sinus pain, sinus pressure and sore throat.   Eyes: Negative.   Respiratory: Positive for cough. Negative for apnea and wheezing.   Cardiovascular: Negative.   Gastrointestinal: Negative.   Endocrine: Negative.   Genitourinary: Negative.   Musculoskeletal: Negative.   Skin: Negative.   Allergic/Immunologic: Negative.   Neurological: Negative.   Hematological: Negative.   Psychiatric/Behavioral: Negative.        Objective:   Physical Exam  Constitutional: She is oriented to person, place, and time. She appears well-developed and well-nourished.  HENT:  Head: Normocephalic and atraumatic.  Mouth/Throat: Oropharynx is clear and moist. No oropharyngeal exudate.  Clear nasal drainage.  Eyes: Conjunctivae are normal. Pupils are equal, round, and reactive to light. Right eye exhibits no  discharge. Left eye exhibits no discharge.  Cardiovascular: Normal rate, regular rhythm and normal heart sounds.   Pulmonary/Chest: She has no wheezes. She has no rales.  Distant breath sounds.  Lymphadenopathy:    She has no cervical adenopathy.  Neurological: She is alert and oriented to person, place, and time.  Skin: Skin is warm and dry.  Nursing note and vitals reviewed.         Assessment & Plan:  Marland KitchenMarland KitchenDiagnoses and all orders for this visit:  COPD exacerbation ([place]) -     doxycycline (VIBRA-TABS) 100 MG tablet; Take 1 tablet (100 mg total) by mouth 2 (two) times daily. For 10 days. -     predniSONE (DELTASONE) 20 MG tablet; Take 3 tablets for 3 days, take 2 tablets for 3 days, take 1 tablet for 3 days, take 1/2 tablet for 4 days. -     methylPREDNISolone sodium succinate (SOLU-MEDROL) 125 mg/2 mL injection 125 mg; Inject 2 mLs (125 mg total) into the muscle once. -     cefTRIAXone (ROCEPHIN) 1 g injection; Inject 1 g into the muscle once. -     cefTRIAXone (ROCEPHIN) injection 1 g; Inject 1 g into the muscle once.  Lumbar degenerative disc disease -     HYDROcodone-acetaminophen (NORCO) 10-325 MG tablet; Take 1 tablet by mouth every 6 (six) hours as needed.  Chronic pain syndrome -     HYDROcodone-acetaminophen (NORCO) 10-325 MG tablet; Take 1 tablet by mouth every 6 (six) hours as needed.  This patient will be treated for a COPD exacerbation with doxycycline, rocephin, prednisone, solumedrol '125mg'$  IM. Follow up as needed.      Patient's Norco was refilled for her lumbar DDD and chronic pain syndrome.

## 2016-07-31 ENCOUNTER — Other Ambulatory Visit: Payer: Self-pay | Admitting: *Deleted

## 2016-07-31 NOTE — Patient Outreach (Signed)
[facility]) Care Management  07/31/2016  Tamara Adams 1940-08-10 [id number]   Routine home visit.  Pt is recooperating from viral respiratory infection. She is taking doxycycline and a prednisone taper. She is feeling much better.  Continues to have abdominal pain. She is maintaining her wt 79-80. She is drinking 2 supplements daily maid into smoothies with fruits and veggies and nuts.  O: BP (!) 156/84   Resp 20   Wt 79 lb (35.8 kg)   SpO2 99% Comment: On 4L O2.  BMI 13.77 kg/m        Irregular heart rhythm      Lungs are clear.      Abdomen soft nontender with bowel sounds active.  A:  COPD, viral illness resolved      Abdominal pain unchanged.  P:  I will see pt in one month.      Maintain abstinence.     Call me if any problems!   Deloria Lair [facility] [place] [phone]

## 2016-08-20 ENCOUNTER — Telehealth: Payer: Self-pay | Admitting: *Deleted

## 2016-08-20 DIAGNOSIS — J441 Chronic obstructive pulmonary disease with (acute) exacerbation: Secondary | ICD-10-CM

## 2016-08-20 MED ORDER — PROMETHAZINE-DM 6.25-15 MG/5ML PO SYRP: 5.0000 mL | ORAL_SOLUTION | Freq: Every evening | ORAL | 0 refills | Status: DC | PRN

## 2016-08-20 NOTE — Telephone Encounter (Signed)
Prescription sent for cough. The over-the-counter nasal decongestants are what is available. There are no prescription nasal decongestants. She can try something like Afrin but can only use it for 3 days and then she has to give her nose a break. She thinks that's more allergic nasal congestion we could try something like a Flonase or Nasonex especially if she has a lot of nasal itching and sneezing with it.  Beatrice Lecher, MD

## 2016-08-20 NOTE — Telephone Encounter (Signed)
Pt called back and wanted to know if she could get a steroid and an abx.Tamara Adams

## 2016-08-20 NOTE — Telephone Encounter (Signed)
Pt called and lvm asking if Dr. Madilyn Fireman would send something in to her pharmacy for a cough and nasal congestion  Pt was last seen on 07/25/16 for COPD w/prod. Cough and nasal congestion.Tamara Adams

## 2016-08-21 ENCOUNTER — Other Ambulatory Visit: Payer: Self-pay | Admitting: Family Medicine

## 2016-08-21 MED ORDER — AZITHROMYCIN 250 MG PO TABS: ORAL_TABLET | ORAL | 0 refills | Status: DC

## 2016-08-21 MED ORDER — PREDNISONE 20 MG PO TABS: ORAL_TABLET | ORAL | 0 refills | Status: DC

## 2016-08-21 NOTE — Telephone Encounter (Signed)
Pt informed of recommendations.Tamara Adams  

## 2016-08-21 NOTE — Telephone Encounter (Signed)
Prescriptions sent for both. Follow-up in 2 weeks to recheck lungs.  Beatrice Lecher, MD

## 2016-08-22 ENCOUNTER — Other Ambulatory Visit: Payer: Self-pay | Admitting: *Deleted

## 2016-08-22 ENCOUNTER — Other Ambulatory Visit: Payer: Self-pay | Admitting: Family Medicine

## 2016-08-22 MED ORDER — AZITHROMYCIN 250 MG PO TABS: ORAL_TABLET | ORAL | 0 refills | Status: DC

## 2016-08-24 ENCOUNTER — Other Ambulatory Visit: Payer: Self-pay | Admitting: *Deleted

## 2016-08-24 MED ORDER — LORAZEPAM 1 MG PO TABS: 1.0000 mg | ORAL_TABLET | Freq: Every day | ORAL | 1 refills | Status: DC

## 2016-08-27 ENCOUNTER — Encounter: Payer: Self-pay | Admitting: Family Medicine

## 2016-08-27 ENCOUNTER — Ambulatory Visit (INDEPENDENT_AMBULATORY_CARE_PROVIDER_SITE_OTHER): Payer: Medicare Other | Admitting: Family Medicine

## 2016-08-27 VITALS — BP 124/80 | HR 101 | Ht 64.0 in | Wt 86.6 lb

## 2016-08-27 DIAGNOSIS — Z23 Encounter for immunization: Secondary | ICD-10-CM

## 2016-08-27 DIAGNOSIS — J441 Chronic obstructive pulmonary disease with (acute) exacerbation: Secondary | ICD-10-CM | POA: Diagnosis not present

## 2016-08-27 DIAGNOSIS — J9611 Chronic respiratory failure with hypoxia: Secondary | ICD-10-CM | POA: Diagnosis not present

## 2016-08-27 DIAGNOSIS — F5101 Primary insomnia: Secondary | ICD-10-CM | POA: Diagnosis not present

## 2016-08-27 DIAGNOSIS — Z72820 Sleep deprivation: Secondary | ICD-10-CM

## 2016-08-27 DIAGNOSIS — G894 Chronic pain syndrome: Secondary | ICD-10-CM

## 2016-08-27 DIAGNOSIS — M5136 Other intervertebral disc degeneration, lumbar region: Secondary | ICD-10-CM | POA: Diagnosis not present

## 2016-08-27 DIAGNOSIS — R110 Nausea: Secondary | ICD-10-CM | POA: Diagnosis not present

## 2016-08-27 DIAGNOSIS — I10 Essential (primary) hypertension: Secondary | ICD-10-CM | POA: Diagnosis not present

## 2016-08-27 MED ORDER — DOXEPIN HCL 10 MG PO CAPS: 10.0000 mg | ORAL_CAPSULE | Freq: Every day | ORAL | 0 refills | Status: DC

## 2016-08-27 MED ORDER — HYDROCODONE-ACETAMINOPHEN 10-325 MG PO TABS: 1.0000 | ORAL_TABLET | Freq: Four times a day (QID) | ORAL | 0 refills | Status: DC | PRN

## 2016-08-27 MED ORDER — AMBULATORY NON FORMULARY MEDICATION: 0 refills | Status: AC

## 2016-08-27 NOTE — Progress Notes (Addendum)
Subjective:    CC: COPD  HPI:  Had recent flare last week with her COPD. She is coming off her prednisone. She was treated with azithromycin as well. She says today is the first day that she actually feels like she is breathing a little better and doesn't feel like her chest is congested. She felt like her sinuses actually started improving yesterday. She had initially sinus congestion with productive cough and had called last week because she was too sick to come in. Even at baseline she is very short of breath at home just taking a shower and doing regular daily activities. She is also primary caretaker for her husband his dementia is advancing and this is been difficult as well.  Hypertension- Pt denies chest pain, SOB, dizziness, or heart palpitations.  Taking meds as directed w/o problems.  Denies medication side effects.    She still complains of persistent nausea. She saw GI and they actually referred her to another physician who is a specialist, Dr. Kinnie Scales. They discussed some got colonization issues but never actually treated her with anything.  She is not sleeping well because she has to get up with her husband in the middle the night. He walks around and talks a lot and this keeps her awake.  Past medical history, Surgical history, Family history not pertinant except as noted below, Social history, Allergies, and medications have been entered into the medical record, reviewed, and corrections made.   Review of Systems: No fevers, chills, night sweats, weight loss, chest pain, or shortness of breath.   Objective:    General: Well Developed, well nourished, and in no acute distress.  Neuro: Alert and oriented x3, extra-ocular muscles intact, sensation grossly intact.  HEENT: Normocephalic, atraumatic  Skin: Warm and dry, no rashes. Cardiac: Regular rate and rhythm, no murmurs rubs or gallops, no lower extremity edema.  Respiratory: Clear to auscultation bilaterally. Not using  accessory muscles, speaking in full sentences.   Impression and Recommendations:    Chronic respiratory failure, COPD with acute exacerbation-improved from recent exacerbation.. Continue with Breo and Incruse. Complete prednisone taper. We'll work on trying to get her a prescription for a new concentrator that can actually go to 6 L since she uses a long cord to be able to move around the house.   Chronic respiratory failure consequent to her COPD- last POC2 was high at 55 06/2016.  BiPAP considered and ruled out. Will prescribed in NIV therapy to reduce PCO2 levels and toprevent breath stacking and air-trapping.  Nausea - I would like to try a very low dose of doxepin 10 mg. I like to see if this helps with her persistent pain, nausea, and sleep issues. If not improving ever to 3 weeks and stop the medication.  HTN - Well controlled. Continue current regimen. Follow up in  6 months.    Insomnia - maybe doxepin will help.

## 2016-08-28 ENCOUNTER — Other Ambulatory Visit: Payer: Self-pay | Admitting: *Deleted

## 2016-08-28 ENCOUNTER — Encounter: Payer: Self-pay | Admitting: *Deleted

## 2016-08-28 NOTE — Patient Outreach (Signed)
Case closure home visit.  S:  Tamara Adams reports she has been doing failry well this month. She reports her weight has reached 83#! She still suffers from nausea at all times of the day. Nothing she has taken really gets rid of it all together, it just reduces the severity of it.  She has a cough and can easily expectorate white mucous. She reports she will be getting a concentrator that will allow her to be able to receive a higher level of O2.  O:  BP (!) 160/96 (BP Location: Left Arm, Patient Position: Sitting, Cuff Size: Small)   Pulse (!) 103   Resp 20   Wt 83 lb (37.6 kg)   SpO2 93%   BMI 14.25 kg/m        RRR       Lungs: inspiratory and expiratory wheezes - it is time for her to take a nebulizer treatment.  A:  COPD       Abdominal pain and nausea  P:  Today is my last home visit. We have discussed the possibility of [place] for her nausea. We both feel like this would be worth a try, anything that could make a difference in her quality of life.  I have encouraged her to call Dr. Madilyn Fireman at the first sign of any health problems for early intervention. I have also told her she can call me for any question or guidance.  Tamara Adams [facility] [phone]      Consider marinol for pt nausea.

## 2016-09-04 ENCOUNTER — Ambulatory Visit: Payer: Self-pay | Admitting: *Deleted

## 2016-09-10 ENCOUNTER — Telehealth: Payer: Self-pay

## 2016-09-10 DIAGNOSIS — R197 Diarrhea, unspecified: Secondary | ICD-10-CM

## 2016-09-10 NOTE — Telephone Encounter (Signed)
Tamara Adams states her husband has been diagnosed with C-diff. She started having diarrhea yesterday. She would like to be treated for C-Diff. Please advise.

## 2016-09-10 NOTE — Telephone Encounter (Signed)
Patient advised and order sent.

## 2016-09-10 NOTE — Telephone Encounter (Signed)
She needs a stool test for c diff. Can order and she can pick up kit today. . This is something we have to document

## 2016-09-21 DIAGNOSIS — J9610 Chronic respiratory failure, unspecified whether with hypoxia or hypercapnia: Secondary | ICD-10-CM | POA: Diagnosis not present

## 2016-09-25 ENCOUNTER — Other Ambulatory Visit: Payer: Self-pay | Admitting: Physician Assistant

## 2016-09-28 ENCOUNTER — Telehealth: Payer: Self-pay

## 2016-09-28 NOTE — Telephone Encounter (Signed)
Tamara Adams sent [facility] a refill for pt's phenergan Wed.  She is completely out.  Pt is requesting that you send a script to [place] to hold her over until she get the mail order one.  Please advise.

## 2016-09-29 MED ORDER — PROMETHAZINE HCL 25 MG PO TABS: ORAL_TABLET | ORAL | 0 refills | Status: DC

## 2016-09-29 NOTE — Addendum Note (Signed)
Addended by: Beatrice Lecher D on: 09/29/2016 03:30 PM   Modules accepted: Orders

## 2016-10-04 ENCOUNTER — Telehealth: Payer: Self-pay | Admitting: Family Medicine

## 2016-10-04 NOTE — Telephone Encounter (Signed)
Received call from [facility], per insurance Pt has to be seen within 30 days of order from PCP. Pt has upcoming appt on 10/08/16. [name] states if PCP puts the following (see below) in that OV note and send it in (F: [phone]) then Pt will be approved for services.   "Chronic respiratory failure consequent to her COPD- last POC2 was high at 55 06/2016.  BiPAP considered and ruled out. Will prescribed in NIV therapy to reduce PCO2 levels and toprevent breath stacking and air-trapping."

## 2016-10-08 ENCOUNTER — Ambulatory Visit (INDEPENDENT_AMBULATORY_CARE_PROVIDER_SITE_OTHER): Payer: Medicare Other | Admitting: Family Medicine

## 2016-10-08 ENCOUNTER — Encounter: Payer: Self-pay | Admitting: Family Medicine

## 2016-10-08 VITALS — BP 144/62 | HR 72 | Ht 64.0 in | Wt 84.8 lb

## 2016-10-08 DIAGNOSIS — G894 Chronic pain syndrome: Secondary | ICD-10-CM | POA: Diagnosis not present

## 2016-10-08 DIAGNOSIS — M5136 Other intervertebral disc degeneration, lumbar region: Secondary | ICD-10-CM | POA: Diagnosis not present

## 2016-10-08 DIAGNOSIS — J449 Chronic obstructive pulmonary disease, unspecified: Secondary | ICD-10-CM

## 2016-10-08 DIAGNOSIS — R7301 Impaired fasting glucose: Secondary | ICD-10-CM

## 2016-10-08 LAB — POCT GLYCOSYLATED HEMOGLOBIN (HGB A1C): Hemoglobin A1C: 5.7

## 2016-10-08 MED ORDER — HYDROCODONE-ACETAMINOPHEN 10-325 MG PO TABS: 1.0000 | ORAL_TABLET | Freq: Four times a day (QID) | ORAL | 0 refills | Status: DC | PRN

## 2016-10-08 NOTE — Progress Notes (Addendum)
  Subjective:    CC: COPD  HPI: Follow-up chronic respiratory failure secondary to COPD-been  using up to 6 L at home.She still gets completely exhausted with activity is only able to do minimal ADLs around the home.  Gastritis/GERD - She would like a refill on the GI cocktail. It helps her more than the Donnatal does but her insurance won't cover it but she would like a prescription just to take as a backup in case the Donnatal is not working. Next  Chronic pain syndrome-she takes secondary to abdominal pain and back pain. She's requesting a refill on her hydrocodone today. She tries to use her tramadol most the time and then will use her hydrocodone as a backup.  Impaired fasting glucose-no increased thirst or urination. No symptoms consistent with hypoglycemia.   Past medical history, Surgical history, Family history not pertinant except as noted below, Social history, Allergies, and medications have been entered into the medical record, reviewed, and corrections made.   Review of Systems: No fevers, chills, night sweats, weight loss, chest pain, or shortness of breath.   Objective:    Vitals:   10/08/16 1322  Weight: 84 lb 12.8 oz (38.5 kg)  Height: '5\' 4"'$  (1.626 m)     General: Well Developed, well nourished, and in no acute distress.  Neuro: Alert and oriented x3, extra-ocular muscles intact, sensation grossly intact.  HEENT: Normocephalic, atraumatic  Skin: Warm and dry, no rashes. Cardiac: Regular rate and rhythm, no murmurs rubs or gallops, no lower extremity edema.  Respiratory: Bilat inspiratory wheeze.   Not using accessory muscles, speaking in full sentences.   Impression and Recommendations:    COPD - Respiratory failure- Not a great candidate for BiPAP because she doesn't be able to move around the home.BiPAP considered and ruled out. We'll be prescribing NIV therapy to reduce PCO2 levels and to prevent breath stacking and air-trapping. She is actually doing a  little better today and rebounding from her recent COPD exacerbation.  Chronic Pain Management for chronic abdominal pain and back pain-we'll get her to sign a pain contract. Opioid risk score of 1. Medication refill today. Patient reviewed in a controlled substance database.   IFG - Stable. ENT to monitor every 6 months and she is frequently on prednisone for COPD exacerbations. Lab Results  Component Value Date   HGBA1C 5.7 10/08/2016

## 2016-10-09 ENCOUNTER — Telehealth: Payer: Self-pay

## 2016-10-09 LAB — DRUG ABUSE PANEL 10-50 NO CONF, U
AMPHETAMINES (1000 ng/mL SCRN): NEGATIVE
BARBITURATES: POSITIVE — AB
BENZODIAZEPINES: NEGATIVE
COCAINE METABOLITES: NEGATIVE
MARIJUANA MET (50 NG/ML SCRN): NEGATIVE
METHADONE: NEGATIVE
METHAQUALONE: NEGATIVE
OPIATES: POSITIVE — AB
PHENCYCLIDINE: NEGATIVE
PROPOXYPHENE: NEGATIVE

## 2016-10-09 MED ORDER — AMBULATORY NON FORMULARY MEDICATION
3 refills | Status: DC
Start: 2016-10-09 — End: 2016-10-10

## 2016-10-09 NOTE — Telephone Encounter (Signed)
Printed.  Please fax.

## 2016-10-09 NOTE — Telephone Encounter (Signed)
Pt's pharmacy called and said that an order for a GI cocktail was to be sent to the pharmacy, they have not received it.  Please advise.

## 2016-10-10 ENCOUNTER — Other Ambulatory Visit: Payer: Self-pay

## 2016-10-10 MED ORDER — AMBULATORY NON FORMULARY MEDICATION: 3 refills | Status: DC

## 2016-10-10 NOTE — Progress Notes (Signed)
Original Rx was sent to the wrong pharmacy.

## 2016-10-12 ENCOUNTER — Telehealth: Payer: Self-pay

## 2016-10-12 NOTE — Telephone Encounter (Signed)
Momence ans gave ok.

## 2016-10-12 NOTE — Telephone Encounter (Signed)
Ok to change

## 2016-10-12 NOTE — Telephone Encounter (Signed)
Pharmacy called and asked if the Donnatal in the GI cocktail can be changed to phenobarbital?  They said with the Donnatal, it will be >$200.  Please advise.

## 2016-10-27 ENCOUNTER — Other Ambulatory Visit: Payer: Self-pay | Admitting: Physician Assistant

## 2016-10-27 ENCOUNTER — Other Ambulatory Visit: Payer: Self-pay | Admitting: Family Medicine

## 2016-10-30 ENCOUNTER — Other Ambulatory Visit: Payer: Self-pay

## 2016-10-30 ENCOUNTER — Telehealth: Payer: Self-pay

## 2016-10-30 NOTE — Telephone Encounter (Signed)
Darian called for a week supply of Tramadol. She has ordered the Tramadol through [facility] but will not receive for another week. I called in 1 week prescription to [facility].

## 2016-11-12 DIAGNOSIS — R630 Anorexia: Secondary | ICD-10-CM | POA: Diagnosis not present

## 2016-11-12 DIAGNOSIS — F439 Reaction to severe stress, unspecified: Secondary | ICD-10-CM | POA: Diagnosis not present

## 2016-11-12 DIAGNOSIS — R110 Nausea: Secondary | ICD-10-CM | POA: Diagnosis not present

## 2016-11-12 DIAGNOSIS — K219 Gastro-esophageal reflux disease without esophagitis: Secondary | ICD-10-CM | POA: Diagnosis not present

## 2016-11-12 DIAGNOSIS — J449 Chronic obstructive pulmonary disease, unspecified: Secondary | ICD-10-CM | POA: Diagnosis not present

## 2016-11-12 DIAGNOSIS — Z79899 Other long term (current) drug therapy: Secondary | ICD-10-CM | POA: Diagnosis not present

## 2016-11-19 ENCOUNTER — Other Ambulatory Visit: Payer: Self-pay | Admitting: *Deleted

## 2016-11-19 ENCOUNTER — Telehealth: Payer: Self-pay | Admitting: *Deleted

## 2016-11-19 DIAGNOSIS — M5136 Other intervertebral disc degeneration, lumbar region: Secondary | ICD-10-CM

## 2016-11-19 DIAGNOSIS — G894 Chronic pain syndrome: Secondary | ICD-10-CM

## 2016-11-19 MED ORDER — HYDROCODONE-ACETAMINOPHEN 10-325 MG PO TABS
1.0000 | ORAL_TABLET | Freq: Four times a day (QID) | ORAL | 0 refills | Status: DC | PRN
Start: 2016-11-19 — End: 2016-12-27

## 2016-11-19 NOTE — Progress Notes (Signed)
rx printed.Tamara Adams [place]

## 2016-11-19 NOTE — Telephone Encounter (Signed)
Pt called and asked for refill for hydrocodone. She stated that her son is coming in w/her husband tomorrow and will p/u the Rx for her.Tamara Adams   [place] substance report printed.Tamara Adams, Tamara Adams

## 2016-12-14 ENCOUNTER — Telehealth: Payer: Self-pay

## 2016-12-14 MED ORDER — TRAMADOL HCL 50 MG PO TABS: 50.0000 mg | ORAL_TABLET | Freq: Four times a day (QID) | ORAL | 0 refills | Status: DC | PRN

## 2016-12-14 MED ORDER — AMBULATORY NON FORMULARY MEDICATION: 3 refills | Status: DC

## 2016-12-14 NOTE — Telephone Encounter (Signed)
Tamara Adams states she needs a refill on the GI cocktail. I advised she should have 1 refill left. She states she uses it BID daily for nausea and stomach pain. She also needs a 30 day refill on tramadol. She doesn't have enough to get her through until [facility] can send a refill. Please advise.

## 2016-12-14 NOTE — Telephone Encounter (Signed)
Printed Rx. In Dr Gardiner Ramus box to be signed.

## 2016-12-14 NOTE — Telephone Encounter (Signed)
OK to refill both. Can change GI cocktail to BID.

## 2016-12-21 ENCOUNTER — Other Ambulatory Visit: Payer: Self-pay | Admitting: Family Medicine

## 2016-12-27 ENCOUNTER — Other Ambulatory Visit: Payer: Self-pay

## 2016-12-27 DIAGNOSIS — M5136 Other intervertebral disc degeneration, lumbar region: Secondary | ICD-10-CM

## 2016-12-27 DIAGNOSIS — G894 Chronic pain syndrome: Secondary | ICD-10-CM

## 2016-12-27 MED ORDER — HYDROCODONE-ACETAMINOPHEN 10-325 MG PO TABS
1.0000 | ORAL_TABLET | Freq: Four times a day (QID) | ORAL | 0 refills | Status: DC | PRN
Start: 2016-12-27 — End: 2017-01-14

## 2017-01-03 ENCOUNTER — Other Ambulatory Visit: Payer: Self-pay | Admitting: *Deleted

## 2017-01-03 ENCOUNTER — Other Ambulatory Visit: Payer: Self-pay | Admitting: Family Medicine

## 2017-01-03 MED ORDER — RANITIDINE HCL 150 MG PO TABS: 150.0000 mg | ORAL_TABLET | Freq: Every day | ORAL | 4 refills | Status: DC

## 2017-01-04 ENCOUNTER — Telehealth: Payer: Self-pay | Admitting: *Deleted

## 2017-01-04 MED ORDER — NYSTATIN 100000 UNIT/ML MT SUSP: 5.0000 mL | Freq: Four times a day (QID) | OROMUCOSAL | 0 refills | Status: DC

## 2017-01-04 NOTE — Telephone Encounter (Signed)
Pt called and stated that she has thrush and wanted to know if Dr Madilyn Fireman would send in a prescription for thrush to [place]. rx sent.Tamara Adams [place]

## 2017-01-14 ENCOUNTER — Ambulatory Visit (INDEPENDENT_AMBULATORY_CARE_PROVIDER_SITE_OTHER): Payer: Medicare Other | Admitting: Family Medicine

## 2017-01-14 ENCOUNTER — Encounter: Payer: Self-pay | Admitting: Family Medicine

## 2017-01-14 VITALS — BP 178/71 | HR 94 | Ht 64.0 in | Wt 86.0 lb

## 2017-01-14 DIAGNOSIS — K219 Gastro-esophageal reflux disease without esophagitis: Secondary | ICD-10-CM | POA: Diagnosis not present

## 2017-01-14 DIAGNOSIS — I10 Essential (primary) hypertension: Secondary | ICD-10-CM

## 2017-01-14 DIAGNOSIS — K56609 Unspecified intestinal obstruction, unspecified as to partial versus complete obstruction: Secondary | ICD-10-CM

## 2017-01-14 DIAGNOSIS — M5136 Other intervertebral disc degeneration, lumbar region: Secondary | ICD-10-CM | POA: Diagnosis not present

## 2017-01-14 DIAGNOSIS — R110 Nausea: Secondary | ICD-10-CM

## 2017-01-14 DIAGNOSIS — G894 Chronic pain syndrome: Secondary | ICD-10-CM | POA: Diagnosis not present

## 2017-01-14 DIAGNOSIS — K2270 Barrett's esophagus without dysplasia: Secondary | ICD-10-CM

## 2017-01-14 MED ORDER — HYDROCODONE-ACETAMINOPHEN 10-325 MG PO TABS: 1.0000 | ORAL_TABLET | Freq: Four times a day (QID) | ORAL | 0 refills | Status: DC | PRN

## 2017-01-14 MED ORDER — TRAMADOL HCL 50 MG PO TABS: 50.0000 mg | ORAL_TABLET | Freq: Four times a day (QID) | ORAL | 1 refills | Status: DC | PRN

## 2017-01-14 NOTE — Progress Notes (Signed)
Subjective:    CC: COPD  HPI:  COPD - Currently on 6 liters.  Stable.  No recent exercerbations.    She had called recenlty bc thought she had thrush again. Says this time didn't work as quickly. Says her throat is still a little sore. Has a couple more days of medication.    Gastritis/GERD- on GI coctail, phenergan, and ranitidine. Was recently started on marinol for her nausea and feels like that does help her.  Her weight has been stable.   Chronic pain management for DDD  Chronic abdominal pain.  Hx of recurrent ileus/small bowel obstruction. - dur for reill on her hydrocodone. Her UDS is UTD. She would like a referral to [facility] for 2nd opinion.  Her family has really been pushing her to go. She has been fearful to leave her husband bc he requires 24 hour care now.        Hypertension- Pt denies chest pain, SOB, dizziness, or heart palpitations.  Taking meds as directed w/o problems.  Denies medication side effects.      Past medical history, Surgical history, Family history not pertinant except as noted below, Social history, Allergies, and medications have been entered into the medical record, reviewed, and corrections made.   Review of Systems: No fevers, chills, night sweats, weight loss, chest pain, or shortness of breath.   Objective:    General: Well Developed, well nourished, and in no acute distress.  Neuro: Alert and oriented x3, extra-ocular muscles intact, sensation grossly intact.  HEENT: Normocephalic, atraumatic, OP is clear.   Skin: Warm and dry, no rashes. Cardiac: Regular rate and rhythm, no murmurs rubs or gallops, no lower extremity edema.  Respiratory: Clear to auscultation bilaterally. Not using accessory muscles, speaking in full sentences.   Impression and Recommendations:    COPD - Stable.  On 6 L.  Chronic abdominal pain with hx of recurrent small bowel obstruction.  Will refer to [facility].  Her son will drive her there.    HTN - uncontrolled. Today.  Not sure why.  Usually BP much better than this. Reports she is taking her medications.   Pharyngitis - oral exam is normal.  ST may just be from irritation from inhalers and medications.    Recheck in 1-2 weeks with nurse visit.    Chronic nausea - she would like to try a higher dose of the Marinol.  I will contact.   Lumbar DDD - refilled narcotics today. UDS UTD.

## 2017-01-15 LAB — COMPLETE METABOLIC PANEL WITH GFR
ALK PHOS: 79 U/L (ref 33–130)
ALT: 10 U/L (ref 6–29)
AST: 23 U/L (ref 10–35)
Albumin: 4.2 g/dL (ref 3.6–5.1)
BILIRUBIN TOTAL: 0.3 mg/dL (ref 0.2–1.2)
BUN: 8 mg/dL (ref 7–25)
CALCIUM: 9.4 mg/dL (ref 8.6–10.4)
CO2: 29 mmol/L (ref 20–31)
CREATININE: 0.75 mg/dL (ref 0.60–0.93)
Chloride: 96 mmol/L — ABNORMAL LOW (ref 98–110)
GFR, Est Non African American: 78 mL/min (ref >=60)
Glucose, Bld: 88 mg/dL (ref 65–99)
Potassium: 4.1 mmol/L (ref 3.5–5.3)
Sodium: 137 mmol/L (ref 135–146)
TOTAL PROTEIN: 6.7 g/dL (ref 6.1–8.1)

## 2017-02-05 ENCOUNTER — Other Ambulatory Visit: Payer: Self-pay

## 2017-02-05 MED ORDER — AMBULATORY NON FORMULARY MEDICATION: 3 refills | Status: DC

## 2017-02-11 ENCOUNTER — Other Ambulatory Visit: Payer: Self-pay | Admitting: Family Medicine

## 2017-02-14 ENCOUNTER — Other Ambulatory Visit: Payer: Self-pay

## 2017-02-14 MED ORDER — ONDANSETRON 8 MG PO TBDP: 8.0000 mg | ORAL_TABLET | Freq: Three times a day (TID) | ORAL | 0 refills | Status: DC | PRN

## 2017-03-17 ENCOUNTER — Other Ambulatory Visit: Payer: Self-pay | Admitting: Family Medicine

## 2017-03-19 ENCOUNTER — Telehealth: Payer: Self-pay

## 2017-03-19 ENCOUNTER — Other Ambulatory Visit: Payer: Self-pay

## 2017-03-19 DIAGNOSIS — G894 Chronic pain syndrome: Secondary | ICD-10-CM

## 2017-03-19 DIAGNOSIS — M5136 Other intervertebral disc degeneration, lumbar region: Secondary | ICD-10-CM

## 2017-03-19 MED ORDER — NYSTATIN 100000 UNIT/ML MT SUSP: 5.0000 mL | Freq: Four times a day (QID) | OROMUCOSAL | 0 refills | Status: DC

## 2017-03-19 MED ORDER — HYDROCODONE-ACETAMINOPHEN 10-325 MG PO TABS: 1.0000 | ORAL_TABLET | Freq: Four times a day (QID) | ORAL | 0 refills | Status: DC | PRN

## 2017-03-19 NOTE — Telephone Encounter (Signed)
OK to refill the nystatin.  Ok to refill hydrocodone  If time.

## 2017-03-19 NOTE — Telephone Encounter (Signed)
Tamara Adams complains of irritation in mouth/throat for a few days. She believes it is due to the inhalers she uses and may cause thrush. She would like a refill on the Nystatin. Is a refill ok?   Also let her know when the hydrocodone refill is ready.

## 2017-03-19 NOTE — Telephone Encounter (Signed)
Pt advised, Rx refilled.

## 2017-03-28 ENCOUNTER — Other Ambulatory Visit: Payer: Self-pay | Admitting: *Deleted

## 2017-03-28 MED ORDER — AMBULATORY NON FORMULARY MEDICATION: 3 refills | Status: DC

## 2017-04-16 ENCOUNTER — Telehealth: Payer: Self-pay | Admitting: Family Medicine

## 2017-04-16 ENCOUNTER — Encounter: Payer: Self-pay | Admitting: Family Medicine

## 2017-04-16 ENCOUNTER — Ambulatory Visit (INDEPENDENT_AMBULATORY_CARE_PROVIDER_SITE_OTHER): Payer: Medicare Other | Admitting: Family Medicine

## 2017-04-16 VITALS — BP 142/62 | HR 79 | Ht 64.0 in | Wt 87.0 lb

## 2017-04-16 DIAGNOSIS — B070 Plantar wart: Secondary | ICD-10-CM | POA: Diagnosis not present

## 2017-04-16 DIAGNOSIS — J9611 Chronic respiratory failure with hypoxia: Secondary | ICD-10-CM

## 2017-04-16 DIAGNOSIS — M5136 Other intervertebral disc degeneration, lumbar region: Secondary | ICD-10-CM | POA: Diagnosis not present

## 2017-04-16 DIAGNOSIS — I10 Essential (primary) hypertension: Secondary | ICD-10-CM

## 2017-04-16 DIAGNOSIS — Z636 Dependent relative needing care at home: Secondary | ICD-10-CM

## 2017-04-16 DIAGNOSIS — G894 Chronic pain syndrome: Secondary | ICD-10-CM | POA: Diagnosis not present

## 2017-04-16 DIAGNOSIS — R1084 Generalized abdominal pain: Secondary | ICD-10-CM

## 2017-04-16 DIAGNOSIS — Z23 Encounter for immunization: Secondary | ICD-10-CM

## 2017-04-16 MED ORDER — HYDROCODONE-ACETAMINOPHEN 10-325 MG PO TABS: 1.0000 | ORAL_TABLET | Freq: Four times a day (QID) | ORAL | 0 refills | Status: DC | PRN

## 2017-04-16 NOTE — Telephone Encounter (Signed)
Please call Tamara Adams. Pt is taking oral GI cocktail. Please find out if we just write for the lidocaine component ( not sure of percentage and amout per dose) what cost would be. We can send as a rx if needed to see how much it would be to run under her insurance and if covered.

## 2017-04-16 NOTE — Progress Notes (Addendum)
Subjective:    Patient ID: Tamara Adams, female    DOB: 01/08/1941, 76 y.o.   MRN: [id number]  HPI F/U COPD - she is on 6 L.  No recent flares. She says when the humidity is high she feels worse.   Chronic pain management for DDD and chronic abdominal pain - asking for refill on her medications.  She is takign medicatinos as prescribed.  Some constipation but not new.    Hypertension- Pt denies chest pain, SOB, dizziness, or heart palpitations.  Taking meds as directed w/o problems.  Denies medication side effects.    She has a plantar wart between her 4th and 5th toes. She would like to have it frozen again.   She is also a primary care take for her husband who has advancing dementia. She can lo longer take care of him. Her son is trying to help get him placed in a nursing home but she is worried about the cost. [facility] will help some but they will still have to pay some monthly.    Review of Systems     BP (!) 142/62   Pulse 79   Ht 5\' 4"  (1.626 m)   Wt 87 lb (39.5 kg)   SpO2 94% Comment: 6L  BMI 14.93 kg/m     Allergies  Allergen Reactions  . Maxidex [Dexamethasone] Other (See Comments)    DEHYDRATION AND HEART RACING  . Vicodin [Hydrocodone-Acetaminophen] Other (See Comments)    Hallucinations  . Advair Diskus [Fluticasone-Salmeterol] Other (See Comments)    No benefit with lungs (INEFFECTIVE)  . Remeron [Mirtazapine] Other (See Comments)    CAUSED NIGHTMARES  . Trazodone And Nefazodone Other (See Comments)    Heart pounding  . Tylox [Oxycodone-Acetaminophen] Itching    Past Medical History:  Diagnosis Date  . Anemia    as a child  . Arthritis    "hands" (12/24/2014)  . Complication of anesthesia    " My blood gas dropped during surgery so I was left on a ventilator and in ICU for 3 days."  . COPD (chronic obstructive pulmonary disease) (HCC)    FVC 72%, FEV1 29%, FEV1 ratio 32% (very severe (COPD)  . COPD (chronic obstructive pulmonary disease) ([place])   .  Degenerative disc disease   . Depression with anxiety   . Diverticulosis   . DVT (deep venous thrombosis) ([place])    "LLE; years ago after a surgery"  . Essential hypertension   . Fluttering sensation of heart    pt put on metoprolol as a result  . GERD (gastroesophageal reflux disease)    PMH  . H/O hiatal hernia   . Headache    "maybe weekly" (12/24/2014)  . Hyperlipidemia   . Hypertension   . Kidney stone   . Liver lesion   . On home oxygen therapy    "3L; sleep w/it; use it when I rest" (12/24/2014)  . Peptic ulcer   . Pinched nerve    in back  . Pneumonia   . PONV (postoperative nausea and vomiting)   . Shortness of breath dyspnea    with exertion  . Skipped heart beats   . Small bowel obstruction ([place])    "several times; OR twice" (12/24/2014)  . Tubular adenoma of colon 09/2011  . Wears glasses     Past Surgical History:  Procedure Laterality Date  . ABDOMINAL ADHESION SURGERY  12/24/2014  . ABDOMINAL HYSTERECTOMY  1987   and one ovary  . CATARACT  EXTRACTION W/ INTRAOCULAR LENS  IMPLANT, BILATERAL Right 06/22/2012   Dr. Lester Kinsman.   Marland Kitchen CATARACT EXTRACTION W/PHACO Left 06/12/2012   Dr. Boykin Reaper  . CHOLECYSTECTOMY N/A 08/18/2013   Procedure: LAPAROSCOPIC CHOLECYSTECTOMY;  Surgeon: Harl Bowie, MD;  Location: [place];  Service: General;  Laterality: N/A;  . DILATION AND CURETTAGE OF UTERUS    . EXPLORATORY LAPAROTOMY  12/24/2014  . HERNIA REPAIR    . INCISIONAL HERNIA REPAIR  12/24/2014  . INCISIONAL HERNIA REPAIR N/A 12/24/2014   Procedure: HERNIA REPAIR INCISIONAL;  Surgeon: Coralie Keens, MD;  Location: [place];  Service: General;  Laterality: N/A;  . LAPAROTOMY N/A 12/24/2014   Procedure: EXPLORATORY LAPAROTOMY;  Surgeon: Coralie Keens, MD;  Location: [place];  Service: General;  Laterality: N/A;  . LYSIS OF ADHESION N/A 12/24/2014   Procedure: LYSIS OF ADHESION;  Surgeon: Coralie Keens, MD;  Location: [place];  Service: General;  Laterality: N/A;  . MULTIPLE TOOTH  EXTRACTIONS    . OOPHORECTOMY  1992  . SMALL INTESTINE SURGERY     for a blockage, no bowel was removed, just kinked from scar tissue  . TUBAL LIGATION    . URETHRAL DILATION      Social History   Social History  . Marital status: Married    Spouse name: N/A  . Number of children: 3  . Years of education: N/A   Occupational History  . retired.      Social History Main Topics  . Smoking status: Former Smoker    Packs/day: 0.25    Years: 57.00    Types: Cigarettes    Quit date: 07/25/2015  . Smokeless tobacco: Never Used     Comment: 5 cigarettes a day  . Alcohol use No  . Drug use: No  . Sexual activity: No   Other Topics Concern  . Not on file   Social History Narrative   No regular exercise.     Family History  Problem Relation Age of Onset  . Hypertension Mother   . Ovarian cancer Mother   . Glaucoma Mother   . Glaucoma Sister   . Colon cancer Neg Hx   . Colon polyps Neg Hx   . Diabetes Neg Hx   . Kidney disease Neg Hx   . Gallbladder disease Neg Hx   . Esophageal cancer Neg Hx     Outpatient Encounter Prescriptions as of 04/16/2017  Medication Sig  . acetaminophen (TYLENOL) 325 MG tablet Take 325-650 mg by mouth every 6 (six) hours as needed for headache.  . albuterol (PROVENTIL) (2.5 MG/3ML) 0.083% nebulizer solution USE 1 VIAL (3 ML) VIA NEBULIZER EVERY 6 HOURS AS NEEDED FOR WHEEZING OR SHORTNESS OF BREATH  . AMBULATORY NON FORMULARY MEDICATION Medication Name: Needs oxygen concentrator set to 6 liter per minutes as she has a long tubing on her machine.  Fax to [facility]  . AMBULATORY NON FORMULARY MEDICATION Medication Name: GI cocktail: 135mL Lidocaine2%, 116ml Maalox, 59ml Donnatol. Ok to take twice daily.  Marland Kitchen amLODipine (NORVASC) 5 MG tablet TAKE 1 TABLET DAILY  . aspirin EC 81 MG tablet Take 81 mg by mouth daily.  Marland Kitchen dronabinol (MARINOL) 2.5 MG capsule Take 2.5 mg by mouth 2 (two) times daily.  Marland Kitchen ENSURE PLUS (ENSURE PLUS) LIQD Take 237 mLs by mouth See  admin instructions. Two to three times a day to cause weight GAIN  . fluticasone furoate-vilanterol (BREO ELLIPTA) 100-25 MCG/INH AEPB Inhale 1 puff into the lungs daily.  Marland Kitchen  guaiFENesin (MUCINEX) 600 MG 12 hr tablet Take 1 tablet (600 mg total) by mouth 2 (two) times daily.  Marland Kitchen HYDROcodone-acetaminophen (NORCO) 10-325 MG tablet Take 1 tablet by mouth every 6 (six) hours as needed. Ok to fill 04/18/17  . LORazepam (ATIVAN) 1 MG tablet Take 1 tablet (1 mg total) by mouth at bedtime. FOR SLEEP Fax: [phone]  . losartan (COZAAR) 100 MG tablet Take 0.5 tablets (50 mg total) by mouth daily.  . magnesium oxide (MAG-OX) 400 (241.3 Mg) MG tablet Take 1 tablet (400 mg total) by mouth daily.  . Multiple Vitamin (MULTIVITAMIN WITH MINERALS) TABS tablet Take 1 tablet by mouth daily. Pt uses One-A-Day brand  . nystatin (MYCOSTATIN) 100000 UNIT/ML suspension Take 5 mLs (500,000 Units total) by mouth 4 (four) times daily.  Marland Kitchen omeprazole (PRILOSEC) 40 MG capsule Take 1 capsule (40 mg total) by mouth 2 (two) times daily.  . ondansetron (ZOFRAN-ODT) 8 MG disintegrating tablet PLACE 1 TABLET ON THE TONGUE EVERY 8 HOURS AS NEEDED FOR NAUSEA  . OXYGEN Inhale 4 L into the lungs continuous.  Marland Kitchen PROAIR HFA 108 (90 Base) MCG/ACT inhaler USE 2 TO 4 INHALATIONS EVERY 4 TO 6 HOURS AS NEEDED  . promethazine (PHENERGAN) 25 MG tablet Take 1 tablet (25 mg total) by mouth every 8 (eight) hours as needed for nausea or vomiting.  . ranitidine (ZANTAC) 150 MG tablet Take 1 tablet (150 mg total) by mouth daily.  . TOPROL XL 50 MG 24 hr tablet TAKE 1 TABLET DAILY  . traMADol (ULTRAM) 50 MG tablet Take 1 tablet (50 mg total) by mouth every 6 (six) hours as needed (pain).  Marland Kitchen umeclidinium bromide (INCRUSE ELLIPTA) 62.5 MCG/INH AEPB Inhale 1 puff into the lungs daily.  . [DISCONTINUED] HYDROcodone-acetaminophen (NORCO) 10-325 MG tablet Take 1 tablet by mouth every 6 (six) hours as needed. Ok to fill 02/24/17 or after.  . [DISCONTINUED]  ondansetron (ZOFRAN-ODT) 8 MG disintegrating tablet Take 1 tablet (8 mg total) by mouth every 8 (eight) hours as needed. for nausea   No facility-administered encounter medications on file as of 04/16/2017.       Objective:   Physical Exam  Constitutional: She is oriented to person, place, and time. She appears well-developed and well-nourished.  HENT:  Head: Normocephalic and atraumatic.  Cardiovascular: Normal rate, regular rhythm and normal heart sounds.   Pulmonary/Chest: Effort normal and breath sounds normal.  Neurological: She is alert and oriented to person, place, and time.  Skin: Skin is warm and dry.  Between the 4th and 5th toes she has a white hyperkeratotic lesion on both toes.  Psychiatric: She has a normal mood and affect. Her behavior is normal.          Assessment & Plan:  COPD - Stable. Continue 6 liters.   Chronic pain management for DDD - Due for another UDS. Will need ot wean ativan since on chronic narcotics bc of inc risk of interaction and death on the combo.   Chronic abdominal pain - she does well with the GI cocktail with donntal but it is very expensive. She wants to see if we could write for the lidocaine seperately.  Will check with Cherlynn Kaiser and [employer].    HTN - BP borderline today but good at home. Will monitor.   Plantar wart - cryotherapy performed.  F/u as needed.    Caregiver burden - Primary care giver for spouse - hopefully will find placement for husband soon.   Cryotherapy Procedure Note  Pre-operative Diagnosis: wart  Post-operative Diagnosis: smae  Locations: between 4th and 5th toe on the right foot   Indications: same   Anesthesia: not required    Procedure Details  Patient informed of risks (permanent scarring, infection, light or dark discoloration, bleeding, infection, weakness, numbness and recurrence of the lesion) and benefits of the procedure and verbal informed consent obtained.  The areas are treated with  liquid nitrogen therapy, frozen until ice ball extended 2-3 mm beyond lesion, allowed to thaw, and treated again. The patient tolerated procedure well.  The patient was instructed on post-op care, warned that there may be blister formation, redness and pain. Recommend OTC analgesia as needed for pain.  Condition: Stable  Complications: none.  Plan: 1. Instructed to keep the area dry and covered for 24-48h and clean thereafter. 2. Warning signs of infection were reviewed.   3. Recommended that the patient use OTC acetaminophen as needed for pain.  4. Return PRN.

## 2017-04-17 MED ORDER — LIDOCAINE VISCOUS 2 % MT SOLN: 20.0000 mL | Freq: Two times a day (BID) | OROMUCOSAL | 3 refills | Status: DC | PRN

## 2017-04-17 MED ORDER — LIDOCAINE VISCOUS 2 % MT SOLN
20.0000 mL | OROMUCOSAL | 3 refills | Status: DC | PRN
Start: 2017-04-17 — End: 2017-04-17

## 2017-04-17 NOTE — Telephone Encounter (Signed)
Attempted to call pharmacy, they are not open yet. Will call back.

## 2017-04-17 NOTE — Telephone Encounter (Signed)
Spoke with the compounding pharmacist at [facility] and was advised PCP could try to submit the Rx's separate (3 different Rx's) and they be ran through insurance. This could potentially created a drug PA, and the Pt would have to mix the solutions herself.  The original Rx was written for: 156mL Lidocaine2%, 178ml Maalox, 76ml Donnatol. Ok to take twice daily.   Donnatol has been discontinued so they have been using Phenobarbital 20mg /6ml elixir instead.

## 2017-04-17 NOTE — Telephone Encounter (Signed)
Okay, please call patient and let her know that I did send of her prescription for lidocaine solution to her mail order pharmacy.

## 2017-04-18 LAB — DRUG ABUSE PANEL 10-50 NO CONF, U
AMPHETAMINES (1000 NG/ML SCRN): NEGATIVE
BARBITURATES: POSITIVE — AB
BENZODIAZEPINES: NEGATIVE
COCAINE METABOLITES: NEGATIVE
MARIJUANA MET (50 NG/ML SCRN): POSITIVE — AB
METHADONE: NEGATIVE
METHAQUALONE: NEGATIVE
OPIATES: POSITIVE — AB
PHENCYCLIDINE: NEGATIVE
PROPOXYPHENE: NEGATIVE

## 2017-04-18 NOTE — Telephone Encounter (Signed)
Pt advised.

## 2017-04-21 DIAGNOSIS — N1330 Unspecified hydronephrosis: Secondary | ICD-10-CM | POA: Diagnosis not present

## 2017-04-21 DIAGNOSIS — K5790 Diverticulosis of intestine, part unspecified, without perforation or abscess without bleeding: Secondary | ICD-10-CM | POA: Diagnosis not present

## 2017-04-21 DIAGNOSIS — R1011 Right upper quadrant pain: Secondary | ICD-10-CM | POA: Diagnosis not present

## 2017-04-21 DIAGNOSIS — K56609 Unspecified intestinal obstruction, unspecified as to partial versus complete obstruction: Secondary | ICD-10-CM | POA: Diagnosis not present

## 2017-04-21 DIAGNOSIS — K5900 Constipation, unspecified: Secondary | ICD-10-CM | POA: Diagnosis not present

## 2017-04-21 DIAGNOSIS — I10 Essential (primary) hypertension: Secondary | ICD-10-CM | POA: Diagnosis not present

## 2017-04-21 DIAGNOSIS — Z7982 Long term (current) use of aspirin: Secondary | ICD-10-CM | POA: Diagnosis not present

## 2017-04-21 DIAGNOSIS — Z9981 Dependence on supplemental oxygen: Secondary | ICD-10-CM | POA: Diagnosis not present

## 2017-04-21 DIAGNOSIS — Z888 Allergy status to other drugs, medicaments and biological substances status: Secondary | ICD-10-CM | POA: Diagnosis not present

## 2017-04-21 DIAGNOSIS — J449 Chronic obstructive pulmonary disease, unspecified: Secondary | ICD-10-CM | POA: Diagnosis present

## 2017-04-21 DIAGNOSIS — Z4682 Encounter for fitting and adjustment of non-vascular catheter: Secondary | ICD-10-CM | POA: Diagnosis not present

## 2017-04-21 DIAGNOSIS — Z79899 Other long term (current) drug therapy: Secondary | ICD-10-CM | POA: Diagnosis not present

## 2017-04-21 DIAGNOSIS — R110 Nausea: Secondary | ICD-10-CM | POA: Diagnosis not present

## 2017-04-21 DIAGNOSIS — Z720 Tobacco use: Secondary | ICD-10-CM | POA: Diagnosis not present

## 2017-04-21 DIAGNOSIS — R143 Flatulence: Secondary | ICD-10-CM | POA: Diagnosis not present

## 2017-04-21 DIAGNOSIS — N281 Cyst of kidney, acquired: Secondary | ICD-10-CM | POA: Diagnosis not present

## 2017-04-21 DIAGNOSIS — K769 Liver disease, unspecified: Secondary | ICD-10-CM | POA: Diagnosis not present

## 2017-04-21 DIAGNOSIS — Z87891 Personal history of nicotine dependence: Secondary | ICD-10-CM | POA: Diagnosis not present

## 2017-04-21 DIAGNOSIS — R1031 Right lower quadrant pain: Secondary | ICD-10-CM | POA: Diagnosis not present

## 2017-05-07 DIAGNOSIS — R1010 Upper abdominal pain, unspecified: Secondary | ICD-10-CM | POA: Diagnosis not present

## 2017-05-07 DIAGNOSIS — R636 Underweight: Secondary | ICD-10-CM | POA: Diagnosis not present

## 2017-05-07 DIAGNOSIS — K2270 Barrett's esophagus without dysplasia: Secondary | ICD-10-CM | POA: Diagnosis not present

## 2017-05-07 DIAGNOSIS — K5902 Outlet dysfunction constipation: Secondary | ICD-10-CM | POA: Diagnosis not present

## 2017-05-07 DIAGNOSIS — R110 Nausea: Secondary | ICD-10-CM | POA: Diagnosis not present

## 2017-05-07 DIAGNOSIS — D508 Other iron deficiency anemias: Secondary | ICD-10-CM | POA: Diagnosis not present

## 2017-05-07 LAB — VITAMIN B12: VITAMIN B 12: 264

## 2017-05-17 ENCOUNTER — Other Ambulatory Visit: Payer: Self-pay | Admitting: *Deleted

## 2017-05-17 MED ORDER — ONDANSETRON 8 MG PO TBDP: ORAL_TABLET | ORAL | 1 refills | Status: DC

## 2017-05-21 ENCOUNTER — Telehealth: Payer: Self-pay | Admitting: Family Medicine

## 2017-05-21 NOTE — Telephone Encounter (Signed)
Call patient: I did receive her note from [facility].  They want her to start doing B12 injections because her vitamin D was low.  Would like to get her scheduled for this to start coming in every 2 weeks for a B12 shot for the first month and then will start to space to every 3 weeks if she is doing well and if her B12 levels are coming up.

## 2017-05-22 DIAGNOSIS — K5902 Outlet dysfunction constipation: Secondary | ICD-10-CM | POA: Diagnosis not present

## 2017-05-22 DIAGNOSIS — K469 Unspecified abdominal hernia without obstruction or gangrene: Secondary | ICD-10-CM | POA: Diagnosis not present

## 2017-05-22 NOTE — Telephone Encounter (Signed)
Notified patient and transferred to scheduling for appointment.

## 2017-05-24 ENCOUNTER — Ambulatory Visit: Payer: Medicare Other

## 2017-05-27 ENCOUNTER — Ambulatory Visit (INDEPENDENT_AMBULATORY_CARE_PROVIDER_SITE_OTHER): Payer: Medicare Other | Admitting: Family Medicine

## 2017-05-27 VITALS — BP 177/79 | HR 83

## 2017-05-27 DIAGNOSIS — E538 Deficiency of other specified B group vitamins: Secondary | ICD-10-CM | POA: Diagnosis not present

## 2017-05-27 MED ORDER — CYANOCOBALAMIN 1000 MCG/ML IJ SOLN
1000.0000 ug | Freq: Once | INTRAMUSCULAR | Status: AC
  Administered 2017-05-27: 1000 ug via INTRAMUSCULAR

## 2017-05-27 NOTE — Progress Notes (Signed)
Pt in for first b12 injection.  It was given LD without immediate complications.  Per patient call in her chart, she is to get injection every 2 weeks for the first month.  Note also says the injections are for "low vit d" so I verified with the pt that it was actually for vit b since there are no labs in her chart showing low levels.  Pt scheduled next nurse visit on her way out.

## 2017-05-27 NOTE — Progress Notes (Signed)
Pernicious anemia-per recommendation of her gastroneurology specialist at [facility] they want [place] to get her B12 vitamin levels elevated.  She is here today for her first injection and then will follow-up in 2 weeks for her second.  Beatrice Lecher, MD

## 2017-05-31 DIAGNOSIS — R0602 Shortness of breath: Secondary | ICD-10-CM | POA: Diagnosis not present

## 2017-05-31 DIAGNOSIS — Z9981 Dependence on supplemental oxygen: Secondary | ICD-10-CM | POA: Diagnosis not present

## 2017-05-31 DIAGNOSIS — J984 Other disorders of lung: Secondary | ICD-10-CM | POA: Diagnosis not present

## 2017-05-31 DIAGNOSIS — Z888 Allergy status to other drugs, medicaments and biological substances status: Secondary | ICD-10-CM | POA: Diagnosis not present

## 2017-05-31 DIAGNOSIS — J449 Chronic obstructive pulmonary disease, unspecified: Secondary | ICD-10-CM | POA: Diagnosis not present

## 2017-05-31 DIAGNOSIS — Z7951 Long term (current) use of inhaled steroids: Secondary | ICD-10-CM | POA: Diagnosis not present

## 2017-05-31 DIAGNOSIS — Z87891 Personal history of nicotine dependence: Secondary | ICD-10-CM | POA: Diagnosis not present

## 2017-05-31 DIAGNOSIS — R079 Chest pain, unspecified: Secondary | ICD-10-CM | POA: Diagnosis not present

## 2017-05-31 DIAGNOSIS — Z885 Allergy status to narcotic agent status: Secondary | ICD-10-CM | POA: Diagnosis not present

## 2017-05-31 DIAGNOSIS — J189 Pneumonia, unspecified organism: Secondary | ICD-10-CM | POA: Diagnosis not present

## 2017-05-31 DIAGNOSIS — I10 Essential (primary) hypertension: Secondary | ICD-10-CM | POA: Diagnosis not present

## 2017-05-31 DIAGNOSIS — R918 Other nonspecific abnormal finding of lung field: Secondary | ICD-10-CM | POA: Diagnosis not present

## 2017-05-31 DIAGNOSIS — R911 Solitary pulmonary nodule: Secondary | ICD-10-CM | POA: Diagnosis not present

## 2017-05-31 DIAGNOSIS — K760 Fatty (change of) liver, not elsewhere classified: Secondary | ICD-10-CM | POA: Diagnosis not present

## 2017-05-31 DIAGNOSIS — J188 Other pneumonia, unspecified organism: Secondary | ICD-10-CM | POA: Diagnosis not present

## 2017-05-31 DIAGNOSIS — R9431 Abnormal electrocardiogram [ECG] [EKG]: Secondary | ICD-10-CM | POA: Diagnosis not present

## 2017-06-10 ENCOUNTER — Ambulatory Visit (INDEPENDENT_AMBULATORY_CARE_PROVIDER_SITE_OTHER): Payer: Medicare Other | Admitting: Family Medicine

## 2017-06-10 VITALS — BP 128/59 | HR 87

## 2017-06-10 DIAGNOSIS — M5136 Other intervertebral disc degeneration, lumbar region: Secondary | ICD-10-CM

## 2017-06-10 DIAGNOSIS — E538 Deficiency of other specified B group vitamins: Secondary | ICD-10-CM

## 2017-06-10 DIAGNOSIS — G894 Chronic pain syndrome: Secondary | ICD-10-CM

## 2017-06-10 MED ORDER — CYANOCOBALAMIN 1000 MCG/ML IJ SOLN
1000.0000 ug | Freq: Once | INTRAMUSCULAR | Status: AC
  Administered 2017-06-10: 1000 ug via INTRAMUSCULAR

## 2017-06-10 MED ORDER — HYDROCODONE-ACETAMINOPHEN 10-325 MG PO TABS: 1.0000 | ORAL_TABLET | Freq: Four times a day (QID) | ORAL | 0 refills | Status: DC | PRN

## 2017-06-10 NOTE — Progress Notes (Signed)
B12 given

## 2017-06-10 NOTE — Progress Notes (Signed)
Pt came into clinic today for B12 injection. Pt reports no negative side effects from last injection. Tolerated injection today in right deltoid well, no immediate complications. Pt has recently been in the hospital, advised to scheduled hospital follow up with PCP. Pt also request a refill on her hydrocodone Rx, this is not due until next week. Did refill the Rx today (since she does not drive) but dated for fill next week when due. Verbalized understanding. No further questions at this time.

## 2017-06-16 ENCOUNTER — Other Ambulatory Visit: Payer: Self-pay | Admitting: Family Medicine

## 2017-06-20 ENCOUNTER — Inpatient Hospital Stay: Payer: Medicare Other | Admitting: Family Medicine

## 2017-06-20 DIAGNOSIS — R278 Other lack of coordination: Secondary | ICD-10-CM | POA: Diagnosis not present

## 2017-06-20 DIAGNOSIS — K5900 Constipation, unspecified: Secondary | ICD-10-CM | POA: Diagnosis not present

## 2017-06-21 ENCOUNTER — Ambulatory Visit (INDEPENDENT_AMBULATORY_CARE_PROVIDER_SITE_OTHER): Payer: Medicare Other | Admitting: Family Medicine

## 2017-06-21 ENCOUNTER — Encounter: Payer: Self-pay | Admitting: Family Medicine

## 2017-06-21 VITALS — HR 108 | Temp 98.6°F | Ht 64.0 in | Wt 86.0 lb

## 2017-06-21 DIAGNOSIS — J441 Chronic obstructive pulmonary disease with (acute) exacerbation: Secondary | ICD-10-CM | POA: Diagnosis not present

## 2017-06-21 DIAGNOSIS — J189 Pneumonia, unspecified organism: Secondary | ICD-10-CM

## 2017-06-21 DIAGNOSIS — J181 Lobar pneumonia, unspecified organism: Secondary | ICD-10-CM | POA: Diagnosis not present

## 2017-06-21 DIAGNOSIS — R911 Solitary pulmonary nodule: Secondary | ICD-10-CM | POA: Diagnosis not present

## 2017-06-21 MED ORDER — LEVOFLOXACIN 500 MG PO TABS: 500.0000 mg | ORAL_TABLET | Freq: Every day | ORAL | 0 refills | Status: DC

## 2017-06-21 MED ORDER — METHYLPREDNISOLONE SODIUM SUCC 125 MG IJ SOLR
125.0000 mg | Freq: Once | INTRAMUSCULAR | Status: AC
  Administered 2017-06-21: 125 mg via INTRAMUSCULAR

## 2017-06-21 MED ORDER — PREDNISONE 20 MG PO TABS: 40.0000 mg | ORAL_TABLET | Freq: Every day | ORAL | 0 refills | Status: DC

## 2017-06-21 NOTE — Progress Notes (Signed)
Subjective:    Patient ID: Tamara Adams, female    DOB: 1941-01-16, 76 y.o.   MRN: [id number]  HPI 76 year old female follows up today for recent ED visit for pneumonia.  After couple days of shortness of breath she went to the emergency department on November 5 and was diagnosed with community-acquired pneumonia.  Started on doxycycline and prednisone.  That she started to feel a little bit better after starting the doxycycline but says she never got completely better now that she has been on the antibiotic for a little over a week she actually feels like she starting to get worse.  Earlier this week she started to get dark colored green and yellow mucus.  She has a lot of discomfort in her chest radiating into her back.  She has had a low-grade fever as of yesterday of 99.2.  She denies any sore throat or ear pain.  Her last nebulizer treatment was about 3-4 hours ago at home.   He was also noted to have an irregular nodular infiltrate in the left lower lobe that they were concerned could also be an underlying malignancy.  And they recommended correlation with PET scan/CT scan on a nonemergent basis.    2018 November CT Angio Pulmonary11/03/2017 [facility] Result Impression  IMPRESSION:  1.No acute pulmonary embolus or thoracic aortic dissection identified. 2.An irregular nodular infiltrate is identified in the left lower lobe, which may represent a developing pneumonia.An underlying neoplasm however cannot be excluded. 3.Irregular nodular density of the left lung apex, which may represent an underlying neoplasm, given the background of emphysema.Consider correlation with a PET/CT scan on a nonemergent basis. 4.Severe emphysematous changes. 5.A small hypervascular densities identified along the posterior right lobe of liver, likely representing a small hemangioma, and less likely hypervascular metastasis.  Electronically Signed by: Zettie Pho  Result Narrative    COMPARISON: Chest radiograph 05/31/2017, CT the abdomen and pelvis 04/21/2017.  INDICATION: Chest pain, acute, PE suspected, intermed prob, positive D-dimer  TECHNIQUE: CTA OF THE CHEST WITH IV CONTRAST  100 mL of Isovue-370, injected intravenously,  2-D and 3-D MIP Sagittal and Coronal reformats were generated and reviewed on an independent workstation and saved to PACS.   Dose reduction was utilized (automated exposure control, mA or kV adjustment based on patient size, or iterative image reconstruction).   FINDINGS:   Pulmonary Arteries: No pulmonary emboli are identified.  Thoracic Aorta: Negative for dissection.Negative for aneurysm.  Cardiovascular: Unremarkable.  Lymph Nodes: No thoracic lymphadenopathy.  Pleura: No pleural effusion. Negative for pneumothorax.  Mediastinum: Unremarkable.  Lungs: Severe emphysematous changes.An irregular nodular densities identified in the medial left lung apex, on image 14 of series 5, measuring 2.5 x 1.7 x 2.3 cm.An irregular nodular infiltrate is also identified in the subpleural left lower lobe on  image 92 of series 5, measuring 2.0 x 1.0 x 2.2 cm.Multiple scattered tiny reticular nodular densities are also identified throughout the lungs.Incidental note is made of a small 4 mm polypoid-like density along the anterior wall the proximal  trachea, which may represent a small focus of inspissated mucus versus an underlying polyp.  Chest Walls: Unremarkable.  Bones: No acute or destructive osseous process.Moderate multilevel thoracic spine degenerative changes.  Lower Neck: Unremarkable.  Upper Abdomen: Mild diffuse fatty infiltration of the liver.Probable small hemangioma of the posterior right lobe of liver.Cholecystectomy.Small hiatal hernia.  Other Result Information  Acute Interface, Incoming Rad Results - 05/31/2017  9:48 PM EST COMPARISON: Chest radiograph 05/31/2017, CT the abdomen  and pelvis  04/21/2017.  INDICATION: Chest pain, acute, PE suspected, intermed prob, positive D-dimer  TECHNIQUE: CTA OF THE CHEST WITH IV CONTRAST  100 mL of Isovue-370, injected intravenously,  2-D and 3-D MIP Sagittal and Coronal reformats were generated and reviewed on an independent workstation and saved to PACS.   Dose reduction was utilized (automated exposure control, mA or kV adjustment based on patient size, or iterative image reconstruction).   FINDINGS:   Pulmonary Arteries: No pulmonary emboli are identified.  Thoracic Aorta: Negative for dissection.  Negative for aneurysm.  Cardiovascular: Unremarkable.  Lymph Nodes: No thoracic lymphadenopathy.  Pleura: No pleural effusion. Negative for pneumothorax.  Mediastinum: Unremarkable.  Lungs: Severe emphysematous changes.  An irregular nodular densities identified in the medial left lung apex, on image 14 of series 5, measuring 2.5 x 1.7 x 2.3 cm.  An irregular nodular infiltrate is also identified in the subpleural left lower lobe on  image 92 of series 5, measuring 2.0 x 1.0 x 2.2 cm.  Multiple scattered tiny reticular nodular densities are also identified throughout the lungs.  Incidental note is made of a small 4 mm polypoid-like density along the anterior wall the proximal  trachea, which may represent a small focus of inspissated mucus versus an underlying polyp.  Chest Walls: Unremarkable.  Bones: No acute or destructive osseous process.  Moderate multilevel thoracic spine degenerative changes.  Lower Neck: Unremarkable.  Upper Abdomen: Mild diffuse fatty infiltration of the liver.  Probable small hemangioma of the posterior right lobe of liver.  Cholecystectomy.  Small hiatal hernia.    IMPRESSION:  1.  No acute pulmonary embolus or thoracic aortic dissection identified. 2.  An irregular nodular infiltrate is identified in the left lower lobe, which may represent a developing pneumonia.  An underlying neoplasm however  cannot be excluded. 3.  Irregular nodular density of the left lung apex, which may represent an underlying neoplasm, given the background of emphysema.  Consider correlation with a PET/CT scan on a nonemergent basis. 4.  Severe emphysematous changes. 5.  A small hypervascular densities identified along the posterior right lobe of liver, likely representing a small hemangioma, and less likely hypervascular metastasis.  Electronically Signed by: Zettie Pho     Review of Systems  Pulse (!) 108   Temp 98.6 F (37 C)   Ht 5\' 4"  (1.626 m)   Wt 86 lb (39 kg)   SpO2 97% Comment: 6L  BMI 14.76 kg/m     Allergies  Allergen Reactions  . Maxidex [Dexamethasone] Other (See Comments)    DEHYDRATION AND HEART RACING  . Vicodin [Hydrocodone-Acetaminophen] Other (See Comments)    Hallucinations  . Advair Diskus [Fluticasone-Salmeterol] Other (See Comments)    No benefit with lungs (INEFFECTIVE)  . Remeron [Mirtazapine] Other (See Comments)    CAUSED NIGHTMARES  . Trazodone And Nefazodone Other (See Comments)    Heart pounding  . Tylox [Oxycodone-Acetaminophen] Itching    Past Medical History:  Diagnosis Date  . Anemia    as a child  . Arthritis    "hands" (12/24/2014)  . Complication of anesthesia    " My blood gas dropped during surgery so I was left on a ventilator and in ICU for 3 days."  . COPD (chronic obstructive pulmonary disease) (HCC)    FVC 72%, FEV1 29%, FEV1 ratio 32% (very severe (COPD)  . COPD (chronic obstructive pulmonary disease) ([place])   . Degenerative disc disease   . Depression with anxiety   . Diverticulosis   .  DVT (deep venous thrombosis) ([place])    "LLE; years ago after a surgery"  . Essential hypertension   . Fluttering sensation of heart    pt put on metoprolol as a result  . GERD (gastroesophageal reflux disease)    PMH  . H/O hiatal hernia   . Headache    "maybe weekly" (12/24/2014)  . Hyperlipidemia   . Hypertension   . Kidney stone   . Liver  lesion   . On home oxygen therapy    "3L; sleep w/it; use it when I rest" (12/24/2014)  . Peptic ulcer   . Pinched nerve    in back  . Pneumonia   . PONV (postoperative nausea and vomiting)   . Shortness of breath dyspnea    with exertion  . Skipped heart beats   . Small bowel obstruction ([place])    "several times; OR twice" (12/24/2014)  . Tubular adenoma of colon 09/2011  . Wears glasses     Past Surgical History:  Procedure Laterality Date  . ABDOMINAL ADHESION SURGERY  12/24/2014  . ABDOMINAL HYSTERECTOMY  1987   and one ovary  . CATARACT EXTRACTION W/ INTRAOCULAR LENS  IMPLANT, BILATERAL Right 06/22/2012   Dr. Lester Kinsman.   Marland Kitchen CATARACT EXTRACTION W/PHACO Left 06/12/2012   Dr. Boykin Reaper  . CHOLECYSTECTOMY N/A 08/18/2013   Procedure: LAPAROSCOPIC CHOLECYSTECTOMY;  Surgeon: Harl Bowie, MD;  Location: [place];  Service: General;  Laterality: N/A;  . DILATION AND CURETTAGE OF UTERUS    . EXPLORATORY LAPAROTOMY  12/24/2014  . HERNIA REPAIR    . INCISIONAL HERNIA REPAIR  12/24/2014  . INCISIONAL HERNIA REPAIR N/A 12/24/2014   Procedure: HERNIA REPAIR INCISIONAL;  Surgeon: Coralie Keens, MD;  Location: [place];  Service: General;  Laterality: N/A;  . LAPAROTOMY N/A 12/24/2014   Procedure: EXPLORATORY LAPAROTOMY;  Surgeon: Coralie Keens, MD;  Location: [place];  Service: General;  Laterality: N/A;  . LYSIS OF ADHESION N/A 12/24/2014   Procedure: LYSIS OF ADHESION;  Surgeon: Coralie Keens, MD;  Location: [place];  Service: General;  Laterality: N/A;  . MULTIPLE TOOTH EXTRACTIONS    . OOPHORECTOMY  1992  . SMALL INTESTINE SURGERY     for a blockage, no bowel was removed, just kinked from scar tissue  . TUBAL LIGATION    . URETHRAL DILATION      Social History   Socioeconomic History  . Marital status: Married    Spouse name: Not on file  . Number of children: 3  . Years of education: Not on file  . Highest education level: Not on file  Social Needs  . Financial resource strain:  Not on file  . Food insecurity - worry: Not on file  . Food insecurity - inability: Not on file  . Transportation needs - medical: Not on file  . Transportation needs - non-medical: Not on file  Occupational History  . Occupation: retired.    Tobacco Use  . Smoking status: Former Smoker    Packs/day: 0.25    Years: 57.00    Pack years: 14.25    Types: Cigarettes    Last attempt to quit: 07/25/2015    Years since quitting: 1.9  . Smokeless tobacco: Never Used  . Tobacco comment: 5 cigarettes a day  Substance and Sexual Activity  . Alcohol use: No    Alcohol/week: 0.0 oz  . Drug use: No  . Sexual activity: No  Other Topics Concern  . Not on file  Social  History Narrative   No regular exercise.     Family History  Problem Relation Age of Onset  . Hypertension Mother   . Ovarian cancer Mother   . Glaucoma Mother   . Glaucoma Sister   . Colon cancer Neg Hx   . Colon polyps Neg Hx   . Diabetes Neg Hx   . Kidney disease Neg Hx   . Gallbladder disease Neg Hx   . Esophageal cancer Neg Hx     Outpatient Encounter Medications as of 06/21/2017  Medication Sig  . acetaminophen (TYLENOL) 325 MG tablet Take 325-650 mg by mouth every 6 (six) hours as needed for headache.  . albuterol (PROVENTIL) (2.5 MG/3ML) 0.083% nebulizer solution USE 1 VIAL (3 ML) VIA NEBULIZER EVERY 6 HOURS AS NEEDED FOR WHEEZING OR SHORTNESS OF BREATH  . AMBULATORY NON FORMULARY MEDICATION Medication Name: Needs oxygen concentrator set to 6 liter per minutes as she has a long tubing on her machine.  Fax to [employer] [facility]  . AMBULATORY NON FORMULARY MEDICATION Medication Name: GI cocktail: 113mL Lidocaine2%, 136ml Maalox, 55ml Donnatol. Ok to take twice daily.  Marland Kitchen amLODipine (NORVASC) 5 MG tablet TAKE 1 TABLET DAILY  . aspirin EC 81 MG tablet Take 81 mg by mouth daily.  Marland Kitchen dronabinol (MARINOL) 2.5 MG capsule Take 2.5 mg by mouth 2 (two) times daily.  Marland Kitchen ENSURE PLUS (ENSURE PLUS) LIQD Take 237 mLs by mouth See admin  instructions. Two to three times a day to cause weight GAIN  . fluticasone furoate-vilanterol (BREO ELLIPTA) 100-25 MCG/INH AEPB Inhale 1 puff into the lungs daily.  Marland Kitchen guaiFENesin (MUCINEX) 600 MG 12 hr tablet Take 1 tablet (600 mg total) by mouth 2 (two) times daily.  Marland Kitchen HYDROcodone-acetaminophen (NORCO) 10-325 MG tablet Take 1 tablet every 6 (six) hours as needed by mouth. OK to fill 06/18/17  . levofloxacin (LEVAQUIN) 500 MG tablet Take 1 tablet (500 mg total) by mouth daily for 10 days.  Marland Kitchen lidocaine (XYLOCAINE) 2 % solution Use as directed 20 mLs in the mouth or throat 2 (two) times daily as needed.  Marland Kitchen losartan (COZAAR) 100 MG tablet Take 0.5 tablets (50 mg total) by mouth daily.  . metoprolol succinate (TOPROL-XL) 50 MG 24 hr tablet TAKE 1 TABLET DAILY  . Multiple Vitamin (MULTIVITAMIN WITH MINERALS) TABS tablet Take 1 tablet by mouth daily. Pt uses One-A-Day brand  . nystatin (MYCOSTATIN) 100000 UNIT/ML suspension Take 5 mLs (500,000 Units total) by mouth 4 (four) times daily.  Marland Kitchen omeprazole (PRILOSEC) 40 MG capsule Take 1 capsule (40 mg total) by mouth 2 (two) times daily.  . ondansetron (ZOFRAN-ODT) 8 MG disintegrating tablet PLACE 1 TABLET ON THE TONGUE EVERY 8 HOURS AS NEEDED FOR NAUSEA  . OXYGEN Inhale 4 L into the lungs continuous.  . predniSONE (DELTASONE) 20 MG tablet Take 2 tablets (40 mg total) by mouth daily.  Marland Kitchen PROAIR HFA 108 (90 Base) MCG/ACT inhaler USE 2 TO 4 INHALATIONS EVERY 4 TO 6 HOURS AS NEEDED  . promethazine (PHENERGAN) 25 MG tablet Take 1 tablet (25 mg total) by mouth every 8 (eight) hours as needed for nausea or vomiting.  . ranitidine (ZANTAC) 150 MG tablet Take 1 tablet (150 mg total) by mouth daily.  . traMADol (ULTRAM) 50 MG tablet Take 1 tablet (50 mg total) by mouth every 6 (six) hours as needed (pain).  Marland Kitchen umeclidinium bromide (INCRUSE ELLIPTA) 62.5 MCG/INH AEPB Inhale 1 puff into the lungs daily.   No facility-administered encounter medications on file  as of  06/21/2017.          Objective:   Physical Exam  Constitutional: She is oriented to person, place, and time. She appears well-developed and well-nourished.  HENT:  Head: Normocephalic and atraumatic.  Right Ear: External ear normal.  Left Ear: External ear normal.  Nose: Nose normal.  Mouth/Throat: Oropharynx is clear and moist.  TMs and canals are clear.   Eyes: Conjunctivae and EOM are normal. Pupils are equal, round, and reactive to light.  Neck: Neck supple. No thyromegaly present.  Cardiovascular: Normal rate, regular rhythm and normal heart sounds.  Pulmonary/Chest: Effort normal. She has wheezes.  Poor air movement bilaterally but she does have some expiratory wheeze in the left upper lobe  Lymphadenopathy:    She has no cervical adenopathy.  Neurological: She is alert and oriented to person, place, and time.  Skin: Skin is warm and dry.  Psychiatric: She has a normal mood and affect.        Assessment & Plan:  COPD exacerbations/left lower lobe pneumonia-did not get complete resolution of symptoms after a course of doxycycline and prednisone.  We will give her Solu-Medrol 125 mg IM here in the office.  Also did give her a neb treatment here but she declined and said she will go straight home.  We will go ahead and have her start Levaquin as well as a round of prednisone.  I like to see her back in 1 week to make sure that she is improving.  Irregular left lower lobe lung nodule-we will plan for CAT scan and or PET scan in about 2 weeks when she is recovered from acute flare.

## 2017-06-24 ENCOUNTER — Encounter: Payer: Self-pay | Admitting: Family Medicine

## 2017-06-24 ENCOUNTER — Other Ambulatory Visit: Payer: Self-pay

## 2017-06-24 MED ORDER — PREDNISONE 20 MG PO TABS: 40.0000 mg | ORAL_TABLET | Freq: Every day | ORAL | 0 refills | Status: DC

## 2017-06-27 ENCOUNTER — Other Ambulatory Visit: Payer: Self-pay

## 2017-06-28 ENCOUNTER — Telehealth: Payer: Self-pay | Admitting: Family Medicine

## 2017-06-28 ENCOUNTER — Ambulatory Visit (INDEPENDENT_AMBULATORY_CARE_PROVIDER_SITE_OTHER): Payer: Medicare Other | Admitting: Family Medicine

## 2017-06-28 ENCOUNTER — Encounter: Payer: Self-pay | Admitting: Family Medicine

## 2017-06-28 VITALS — BP 138/72 | HR 80 | Ht 64.0 in | Wt 86.0 lb

## 2017-06-28 DIAGNOSIS — J441 Chronic obstructive pulmonary disease with (acute) exacerbation: Secondary | ICD-10-CM

## 2017-06-28 DIAGNOSIS — J189 Pneumonia, unspecified organism: Secondary | ICD-10-CM

## 2017-06-28 DIAGNOSIS — J181 Lobar pneumonia, unspecified organism: Secondary | ICD-10-CM

## 2017-06-28 NOTE — Telephone Encounter (Signed)
Called and spoke w/pt's son and gave him the contact information for the scooter store and asked that he initiate the forms for his mother's scooter.Tamara Adams ]

## 2017-06-28 NOTE — Telephone Encounter (Signed)
Call patient's son, Makelle Marrone phone number [phone].  Please let him know that we will try to see if she might qualify for a scooter.  Recommend that he contact the scooter store. [phone] They can intiate and intake and then they will fax me the moves.

## 2017-06-28 NOTE — Progress Notes (Signed)
   Subjective:    Patient ID: Tamara Adams, female    DOB: 06-11-1941, 76 y.o.   MRN: [id number]  HPI 76 -year-old female comes in today to follow-up for PD exacerbation and pneumonia.  Her Levaquin yesterday and her prednisone today.  She is feeling much better than she was last Friday but still little short of breath overall.  But back to her baseline she is a little bit more tired though.  Wanted to talk about the fact that she is having more more difficulty with ambulation.  At home it is difficult for her to move from her bedroom to the bathroom without getting very short of breath and having to stop and rest.  She wonders if she would be a candidate for a motorized wheelchair or scooter.  Review of Systems     Objective:   Physical Exam  Constitutional: She is oriented to person, place, and time. She appears well-developed and well-nourished.  HENT:  Head: Normocephalic and atraumatic.  Cardiovascular: Normal rate, regular rhythm and normal heart sounds.  Pulmonary/Chest: Effort normal and breath sounds normal.  Neurological: She is alert and oriented to person, place, and time.  Skin: Skin is warm and dry.  Psychiatric: She has a normal mood and affect. Her behavior is normal.          Assessment & Plan:  COPD exacerbation/left lower lobe pneumonia-much improved.  She does have an extra round of prednisone at home in case she starts to get short of breath with the winter storm that is coming.  Call if she feels like she is getting worse or develops fever or increased sputum production.  We will go ahead and get her set up for repeat CT scan.  Dyspnea on exertion-we discussed that she might benefit from a scooter especially if she is having problems even within the home getting from room to room without having to stop and rest.  I can contact her son and we can see if we can move forward with an application to see if she might qualify.

## 2017-07-02 ENCOUNTER — Other Ambulatory Visit: Payer: Self-pay | Admitting: Family Medicine

## 2017-07-04 ENCOUNTER — Ambulatory Visit (INDEPENDENT_AMBULATORY_CARE_PROVIDER_SITE_OTHER): Payer: Medicare Other

## 2017-07-04 DIAGNOSIS — J441 Chronic obstructive pulmonary disease with (acute) exacerbation: Secondary | ICD-10-CM | POA: Diagnosis not present

## 2017-07-04 DIAGNOSIS — R911 Solitary pulmonary nodule: Secondary | ICD-10-CM | POA: Diagnosis not present

## 2017-07-04 DIAGNOSIS — I700 Atherosclerosis of aorta: Secondary | ICD-10-CM | POA: Diagnosis not present

## 2017-07-04 DIAGNOSIS — J439 Emphysema, unspecified: Secondary | ICD-10-CM | POA: Diagnosis not present

## 2017-07-05 ENCOUNTER — Ambulatory Visit
Admission: RE | Admit: 2017-07-05 | Discharge: 2017-07-05 | Disposition: A | Discharge status: Home / Self Care | Payer: Self-pay | Source: Ambulatory Visit | Attending: Family Medicine | Admitting: Family Medicine

## 2017-07-05 ENCOUNTER — Other Ambulatory Visit: Payer: Self-pay | Admitting: Family Medicine

## 2017-07-05 ENCOUNTER — Encounter: Payer: Self-pay | Admitting: Family Medicine

## 2017-07-05 DIAGNOSIS — R0602 Shortness of breath: Secondary | ICD-10-CM

## 2017-07-08 ENCOUNTER — Ambulatory Visit: Payer: Medicare Other | Admitting: Family Medicine

## 2017-07-10 ENCOUNTER — Ambulatory Visit (INDEPENDENT_AMBULATORY_CARE_PROVIDER_SITE_OTHER): Payer: Medicare Other | Admitting: Family Medicine

## 2017-07-10 VITALS — BP 137/56 | HR 97 | Wt 88.0 lb

## 2017-07-10 DIAGNOSIS — G8929 Other chronic pain: Secondary | ICD-10-CM

## 2017-07-10 DIAGNOSIS — E538 Deficiency of other specified B group vitamins: Secondary | ICD-10-CM | POA: Diagnosis not present

## 2017-07-10 DIAGNOSIS — J449 Chronic obstructive pulmonary disease, unspecified: Secondary | ICD-10-CM

## 2017-07-10 DIAGNOSIS — J9611 Chronic respiratory failure with hypoxia: Secondary | ICD-10-CM

## 2017-07-10 MED ORDER — AMBULATORY NON FORMULARY MEDICATION: 0 refills | Status: DC

## 2017-07-10 MED ORDER — CYANOCOBALAMIN 1000 MCG/ML IJ SOLN
1000.0000 ug | Freq: Once | INTRAMUSCULAR | Status: AC
  Administered 2017-07-10: 1000 ug via INTRAMUSCULAR

## 2017-07-10 NOTE — Progress Notes (Signed)
   Subjective:    Patient ID: Tamara Adams, female    DOB: 20-Dec-1940, 76 y.o.   MRN: [id number]  HPI  Griffin Dewilde is here for a vitamin B 12 injection. Denies muscle cramps, weakness or irregular heart rate.   COPD - Due to her current condition she has trouble walking for any length of time. She would benefit from a wheel chair.   Review of Systems     Objective:   Physical Exam        Assessment & Plan:  Vitamin B 12 deficiency - Patient tolerated injection well without complications. Patient advised to schedule next injection 30 days from today.   Rx given for PT and OT evaluation for a need for a wheel chair.   [place], [place] [address] -- [place] Email   Phone: [phone] Fax: [phone]

## 2017-07-11 ENCOUNTER — Other Ambulatory Visit: Payer: Self-pay | Admitting: Family Medicine

## 2017-07-11 DIAGNOSIS — R9389 Abnormal findings on diagnostic imaging of other specified body structures: Secondary | ICD-10-CM

## 2017-07-11 DIAGNOSIS — R911 Solitary pulmonary nodule: Secondary | ICD-10-CM

## 2017-07-11 NOTE — Progress Notes (Signed)
Order placed.   Beatrice Lecher, MD

## 2017-07-17 ENCOUNTER — Telehealth: Payer: Self-pay | Admitting: *Deleted

## 2017-07-17 ENCOUNTER — Other Ambulatory Visit: Payer: Self-pay | Admitting: Physician Assistant

## 2017-07-17 ENCOUNTER — Other Ambulatory Visit: Payer: Self-pay | Admitting: *Deleted

## 2017-07-17 MED ORDER — DRONABINOL 2.5 MG PO CAPS: 2.5000 mg | ORAL_CAPSULE | Freq: Two times a day (BID) | ORAL | 1 refills | Status: DC

## 2017-07-17 MED ORDER — POLYMYXIN B-TRIMETHOPRIM 10000-0.1 UNIT/ML-% OP SOLN: OPHTHALMIC | 0 refills | Status: DC

## 2017-07-17 NOTE — Telephone Encounter (Signed)
Pt notified of rx & recommendations.

## 2017-07-17 NOTE — Telephone Encounter (Signed)
Sent polytrim to use one drop effected eye every 6 hours for 7 days. Follow up if not improving or worsening. If transmitted to other eye use with same directions.

## 2017-07-17 NOTE — Progress Notes (Signed)
Pink eye exposure. Will send polytrim drops. Follow up if not improving.

## 2017-07-17 NOTE — Telephone Encounter (Signed)
Pt called stating that her great granddaughter has pink eye and pt is currently experiencing irritation & slight drainage bilaterally.  Pt wants to know if you'd call her something in to [facility].

## 2017-07-18 ENCOUNTER — Inpatient Hospital Stay (HOSPITAL_BASED_OUTPATIENT_CLINIC_OR_DEPARTMENT_OTHER)
Admission: EM | Admit: 2017-07-18 | Discharge: 2017-07-27 | DRG: 388 | Disposition: A | Discharge status: Home Health | Payer: Medicare Other | Attending: Nephrology | Admitting: Nephrology

## 2017-07-18 ENCOUNTER — Emergency Department (HOSPITAL_BASED_OUTPATIENT_CLINIC_OR_DEPARTMENT_OTHER): Payer: Medicare Other

## 2017-07-18 ENCOUNTER — Other Ambulatory Visit: Payer: Self-pay

## 2017-07-18 ENCOUNTER — Encounter (HOSPITAL_BASED_OUTPATIENT_CLINIC_OR_DEPARTMENT_OTHER): Payer: Self-pay | Admitting: *Deleted

## 2017-07-18 DIAGNOSIS — I214 Non-ST elevation (NSTEMI) myocardial infarction: Secondary | ICD-10-CM | POA: Diagnosis not present

## 2017-07-18 DIAGNOSIS — N179 Acute kidney failure, unspecified: Secondary | ICD-10-CM | POA: Diagnosis present

## 2017-07-18 DIAGNOSIS — Z7982 Long term (current) use of aspirin: Secondary | ICD-10-CM

## 2017-07-18 DIAGNOSIS — K56609 Unspecified intestinal obstruction, unspecified as to partial versus complete obstruction: Secondary | ICD-10-CM | POA: Diagnosis present

## 2017-07-18 DIAGNOSIS — J690 Pneumonitis due to inhalation of food and vomit: Secondary | ICD-10-CM | POA: Diagnosis not present

## 2017-07-18 DIAGNOSIS — Z79891 Long term (current) use of opiate analgesic: Secondary | ICD-10-CM

## 2017-07-18 DIAGNOSIS — Z9049 Acquired absence of other specified parts of digestive tract: Secondary | ICD-10-CM

## 2017-07-18 DIAGNOSIS — Z87891 Personal history of nicotine dependence: Secondary | ICD-10-CM

## 2017-07-18 DIAGNOSIS — I10 Essential (primary) hypertension: Secondary | ICD-10-CM | POA: Diagnosis present

## 2017-07-18 DIAGNOSIS — Z90721 Acquired absence of ovaries, unilateral: Secondary | ICD-10-CM

## 2017-07-18 DIAGNOSIS — Z90710 Acquired absence of both cervix and uterus: Secondary | ICD-10-CM

## 2017-07-18 DIAGNOSIS — E43 Unspecified severe protein-calorie malnutrition: Secondary | ICD-10-CM | POA: Diagnosis not present

## 2017-07-18 DIAGNOSIS — F418 Other specified anxiety disorders: Secondary | ICD-10-CM | POA: Diagnosis present

## 2017-07-18 DIAGNOSIS — R059 Cough, unspecified: Secondary | ICD-10-CM

## 2017-07-18 DIAGNOSIS — Z79899 Other long term (current) drug therapy: Secondary | ICD-10-CM

## 2017-07-18 DIAGNOSIS — Z86718 Personal history of other venous thrombosis and embolism: Secondary | ICD-10-CM

## 2017-07-18 DIAGNOSIS — Z0189 Encounter for other specified special examinations: Secondary | ICD-10-CM

## 2017-07-18 DIAGNOSIS — I472 Ventricular tachycardia: Secondary | ICD-10-CM | POA: Diagnosis not present

## 2017-07-18 DIAGNOSIS — K567 Ileus, unspecified: Secondary | ICD-10-CM | POA: Diagnosis present

## 2017-07-18 DIAGNOSIS — I480 Paroxysmal atrial fibrillation: Secondary | ICD-10-CM

## 2017-07-18 DIAGNOSIS — Z681 Body mass index (BMI) 19 or less, adult: Secondary | ICD-10-CM

## 2017-07-18 DIAGNOSIS — K56699 Other intestinal obstruction unspecified as to partial versus complete obstruction: Secondary | ICD-10-CM | POA: Diagnosis not present

## 2017-07-18 DIAGNOSIS — Z888 Allergy status to other drugs, medicaments and biological substances status: Secondary | ICD-10-CM

## 2017-07-18 DIAGNOSIS — Z8041 Family history of malignant neoplasm of ovary: Secondary | ICD-10-CM

## 2017-07-18 DIAGNOSIS — Z4682 Encounter for fitting and adjustment of non-vascular catheter: Secondary | ICD-10-CM | POA: Diagnosis not present

## 2017-07-18 DIAGNOSIS — J9611 Chronic respiratory failure with hypoxia: Secondary | ICD-10-CM | POA: Diagnosis present

## 2017-07-18 DIAGNOSIS — J449 Chronic obstructive pulmonary disease, unspecified: Secondary | ICD-10-CM | POA: Diagnosis present

## 2017-07-18 DIAGNOSIS — Z8249 Family history of ischemic heart disease and other diseases of the circulatory system: Secondary | ICD-10-CM

## 2017-07-18 DIAGNOSIS — Z9981 Dependence on supplemental oxygen: Secondary | ICD-10-CM

## 2017-07-18 DIAGNOSIS — I481 Persistent atrial fibrillation: Secondary | ICD-10-CM | POA: Diagnosis not present

## 2017-07-18 DIAGNOSIS — G894 Chronic pain syndrome: Secondary | ICD-10-CM | POA: Diagnosis present

## 2017-07-18 DIAGNOSIS — Z4659 Encounter for fitting and adjustment of other gastrointestinal appliance and device: Secondary | ICD-10-CM

## 2017-07-18 DIAGNOSIS — R140 Abdominal distension (gaseous): Secondary | ICD-10-CM | POA: Diagnosis not present

## 2017-07-18 DIAGNOSIS — K219 Gastro-esophageal reflux disease without esophagitis: Secondary | ICD-10-CM | POA: Diagnosis present

## 2017-07-18 DIAGNOSIS — Z973 Presence of spectacles and contact lenses: Secondary | ICD-10-CM

## 2017-07-18 DIAGNOSIS — K5651 Intestinal adhesions [bands], with partial obstruction: Secondary | ICD-10-CM | POA: Diagnosis not present

## 2017-07-18 DIAGNOSIS — R509 Fever, unspecified: Secondary | ICD-10-CM

## 2017-07-18 DIAGNOSIS — I700 Atherosclerosis of aorta: Secondary | ICD-10-CM | POA: Diagnosis present

## 2017-07-18 DIAGNOSIS — Z885 Allergy status to narcotic agent status: Secondary | ICD-10-CM

## 2017-07-18 DIAGNOSIS — D638 Anemia in other chronic diseases classified elsewhere: Secondary | ICD-10-CM | POA: Diagnosis present

## 2017-07-18 DIAGNOSIS — Z961 Presence of intraocular lens: Secondary | ICD-10-CM | POA: Diagnosis present

## 2017-07-18 DIAGNOSIS — I25119 Atherosclerotic heart disease of native coronary artery with unspecified angina pectoris: Secondary | ICD-10-CM | POA: Diagnosis not present

## 2017-07-18 DIAGNOSIS — R05 Cough: Secondary | ICD-10-CM

## 2017-07-18 LAB — CBC WITH DIFFERENTIAL/PLATELET
BASOS ABS: 0.1 10*3/uL (ref 0.0–0.1)
BASOS PCT: 1 %
EOS ABS: 0.1 10*3/uL (ref 0.0–0.7)
Eosinophils Relative: 1 %
HCT: 34.8 % — ABNORMAL LOW (ref 36.0–46.0)
HEMOGLOBIN: 11 g/dL — AB (ref 12.0–15.0)
LYMPHS ABS: 1.8 10*3/uL (ref 0.7–4.0)
Lymphocytes Relative: 18 %
MCH: 27.4 pg (ref 26.0–34.0)
MCHC: 31.6 g/dL (ref 30.0–36.0)
MCV: 86.6 fL (ref 78.0–100.0)
Monocytes Absolute: 0.8 10*3/uL (ref 0.1–1.0)
Monocytes Relative: 8 %
NEUTROS PCT: 74 %
Neutro Abs: 7.6 10*3/uL (ref 1.7–7.7)
Platelets: 380 10*3/uL (ref 150–400)
RBC: 4.02 MIL/uL (ref 3.87–5.11)
RDW: 14.2 % (ref 11.5–15.5)
WBC: 10.3 10*3/uL (ref 4.0–10.5)

## 2017-07-18 LAB — COMPREHENSIVE METABOLIC PANEL
ALT: 15 U/L (ref 14–54)
AST: 28 U/L (ref 15–41)
Albumin: 4.1 g/dL (ref 3.5–5.0)
Alkaline Phosphatase: 74 U/L (ref 38–126)
Anion gap: 11 (ref 5–15)
BUN: 9 mg/dL (ref 6–20)
CALCIUM: 9.3 mg/dL (ref 8.9–10.3)
CHLORIDE: 93 mmol/L — AB (ref 101–111)
CO2: 31 mmol/L (ref 22–32)
CREATININE: 0.77 mg/dL (ref 0.44–1.00)
Glucose, Bld: 171 mg/dL — ABNORMAL HIGH (ref 65–99)
Potassium: 4.3 mmol/L (ref 3.5–5.1)
Sodium: 135 mmol/L (ref 135–145)
TOTAL PROTEIN: 7.3 g/dL (ref 6.5–8.1)
Total Bilirubin: 0.4 mg/dL (ref 0.3–1.2)

## 2017-07-18 MED ORDER — HYDROMORPHONE HCL 1 MG/ML IJ SOLN
0.5000 mg | Freq: Once | INTRAMUSCULAR | Status: AC
  Administered 2017-07-18: 0.5 mg via INTRAVENOUS
  Filled 2017-07-18: qty 1

## 2017-07-18 MED ORDER — IOPAMIDOL (ISOVUE-300) INJECTION 61%
100.0000 mL | Freq: Once | INTRAVENOUS | Status: AC | PRN
  Administered 2017-07-18: 75 mL via INTRAVENOUS

## 2017-07-18 MED ORDER — HYDROMORPHONE HCL 1 MG/ML IJ SOLN: 1.0000 mg | Freq: Once | INTRAMUSCULAR | Status: DC

## 2017-07-18 MED ORDER — ONDANSETRON HCL 4 MG/2ML IJ SOLN
4.0000 mg | Freq: Once | INTRAMUSCULAR | Status: AC
  Administered 2017-07-18: 4 mg via INTRAVENOUS
  Filled 2017-07-18: qty 2

## 2017-07-18 MED ORDER — MORPHINE SULFATE (PF) 4 MG/ML IV SOLN
4.0000 mg | Freq: Once | INTRAVENOUS | Status: AC
  Administered 2017-07-18: 4 mg via INTRAVENOUS
  Filled 2017-07-18: qty 1

## 2017-07-18 MED ORDER — ALBUTEROL SULFATE (2.5 MG/3ML) 0.083% IN NEBU: 2.5000 mg | INHALATION_SOLUTION | Freq: Four times a day (QID) | RESPIRATORY_TRACT | Status: DC | PRN

## 2017-07-18 NOTE — ED Triage Notes (Signed)
C/o abd pain off and on all the time  Got real bad around noon,  Nausea  Denies vomiting or diarrhea,  Denies urinary sx  Has taken tramadol, Vicodin 20 mg since 1000 am  Without relief,,

## 2017-07-18 NOTE — ED Provider Notes (Signed)
[facility] EMERGENCY DEPARTMENT Provider Note   CSN: [id number] Arrival date & time: 07/18/17  2010     History   Chief Complaint Chief Complaint  Patient presents with  . Abdominal Pain    HPI Zakeya Junker is a 76 y.o. female.  HPI   Patient is a 76 year old female with past medical history significant for COPD on 6  liters of home O2, history of chronic abdominal pain, multiple surgeries, multiple small bowel obstruction's.  Patient is here today with vomiting and increased abdominal pain.  States it feels like a small bowel obstruction.  Patient has tenderness to abdomen.  Reports a small bowel movement this morning.  Has been placing flatus recently.  Past Medical History:  Diagnosis Date  . Anemia    as a child  . Arthritis    "hands" (12/24/2014)  . Complication of anesthesia    " My blood gas dropped during surgery so I was left on a ventilator and in ICU for 3 days."  . COPD (chronic obstructive pulmonary disease) (HCC)    FVC 72%, FEV1 29%, FEV1 ratio 32% (very severe (COPD)  . COPD (chronic obstructive pulmonary disease) ([place])   . Degenerative disc disease   . Depression with anxiety   . Diverticulosis   . DVT (deep venous thrombosis) ([place])    "LLE; years ago after a surgery"  . Essential hypertension   . Fluttering sensation of heart    pt put on metoprolol as a result  . GERD (gastroesophageal reflux disease)    PMH  . H/O hiatal hernia   . Headache    "maybe weekly" (12/24/2014)  . Hyperlipidemia   . Hypertension   . Kidney stone   . Liver lesion   . On home oxygen therapy    "3L; sleep w/it; use it when I rest" (12/24/2014)  . Peptic ulcer   . Pinched nerve    in back  . Pneumonia   . PONV (postoperative nausea and vomiting)   . Shortness of breath dyspnea    with exertion  . Skipped heart beats   . Small bowel obstruction ([place])    "several times; OR twice" (12/24/2014)  . Tubular adenoma of colon 09/2011  . Wears glasses      Patient Active Problem List   Diagnosis Date Noted  . Caregiver burden 04/16/2017  . IFG (impaired fasting glucose) 10/08/2016  . Chronic pain syndrome 07/25/2016  . Physical deconditioning   . Hyponatremia   . SOB (shortness of breath)   . Constipation 03/25/2016  . Barrett's esophagus without dysplasia 12/15/2015  . Ileus ([place]) 12/02/2015  . Hypokalemia 12/02/2015  . Essential hypertension   . Esophageal reflux   . Depression with anxiety   . Generalized abdominal pain   . Leukocytosis 01/12/2015  . Aortic atherosclerosis ([place]) 10/05/2014  . Abnormal CT of liver 02/05/2014  . Severe protein-calorie malnutrition ([place]) 01/29/2014  . Small bowel obstruction ([place]) 01/28/2014  . Chronic hypoxemic respiratory failure ([place]) 12/07/2013  . Lumbar degenerative disc disease 05/28/2013  . Gastric ulcer 04/22/2013  . Other malaise and fatigue 10/09/2012  . CHEST PAIN, PRECORDIAL 10/04/2010  . HLD (hyperlipidemia) 09/14/2010  . History of tobacco abuse 09/14/2010  . COPD (chronic obstructive pulmonary disease) ([place]) 09/14/2010  . Insomnia 09/14/2010  . PALPITATIONS 09/14/2010  . CAROTID BRUIT 09/14/2010    Past Surgical History:  Procedure Laterality Date  . ABDOMINAL ADHESION SURGERY  12/24/2014  . ABDOMINAL HYSTERECTOMY  1987   and  one ovary  . CATARACT EXTRACTION W/ INTRAOCULAR LENS  IMPLANT, BILATERAL Right 06/22/2012   Dr. Lester Kinsman.   Marland Kitchen CATARACT EXTRACTION W/PHACO Left 06/12/2012   Dr. Boykin Reaper  . CHOLECYSTECTOMY N/A 08/18/2013   Procedure: LAPAROSCOPIC CHOLECYSTECTOMY;  Surgeon: Harl Bowie, MD;  Location: [facility];  Service: General;  Laterality: N/A;  . DILATION AND CURETTAGE OF UTERUS    . EXPLORATORY LAPAROTOMY  12/24/2014  . HERNIA REPAIR    . INCISIONAL HERNIA REPAIR  12/24/2014  . INCISIONAL HERNIA REPAIR N/A 12/24/2014   Procedure: HERNIA REPAIR INCISIONAL;  Surgeon: Coralie Keens, MD;  Location: [facility];  Service: General;  Laterality: N/A;  . LAPAROTOMY  N/A 12/24/2014   Procedure: EXPLORATORY LAPAROTOMY;  Surgeon: Coralie Keens, MD;  Location: [facility];  Service: General;  Laterality: N/A;  . LYSIS OF ADHESION N/A 12/24/2014   Procedure: LYSIS OF ADHESION;  Surgeon: Coralie Keens, MD;  Location: [facility];  Service: General;  Laterality: N/A;  . MULTIPLE TOOTH EXTRACTIONS    . OOPHORECTOMY  1992  . SMALL INTESTINE SURGERY     for a blockage, no bowel was removed, just kinked from scar tissue  . TUBAL LIGATION    . URETHRAL DILATION      OB History    No data available       Home Medications    Prior to Admission medications   Medication Sig Start Date End Date Taking? Authorizing Provider  acetaminophen (TYLENOL) 325 MG tablet Take 325-650 mg by mouth every 6 (six) hours as needed for headache.    [provider]  albuterol (PROVENTIL) (2.5 MG/3ML) 0.083% nebulizer solution USE 1 VIAL (3 ML) VIA NEBULIZER EVERY 6 HOURS AS NEEDED FOR WHEEZING OR SHORTNESS OF BREATH 02/11/17   [name], MD  AMBULATORY NON FORMULARY MEDICATION Medication Name: Needs oxygen concentrator set to 6 liter per minutes as she has a long tubing on her machine.  Fax to [name] 08/27/16   [name], MD  AMBULATORY NON FORMULARY MEDICATION Medication Name: GI cocktail: 113mL Lidocaine2%, 112ml Maalox, 7ml Donnatol. Ok to take twice daily. 03/28/17   [name], MD  AMBULATORY NON FORMULARY MEDICATION Wheel Chair 07/10/17   [name], MD  AMBULATORY NON FORMULARY MEDICATION PT and OT Evaluation and Treatment. 07/10/17   [name], MD  amLODipine (NORVASC) 5 MG tablet TAKE 1 TABLET DAILY 11/02/16   [name], MD  aspirin EC 81 MG tablet Take 81 mg by mouth daily.    [provider]  dronabinol (MARINOL) 2.5 MG capsule Take 1 capsule (2.5 mg total) by mouth 2 (two) times daily. 07/17/17   Breeback, Jade L, PA-C  ENSURE PLUS (ENSURE PLUS) LIQD Take 237 mLs by mouth See admin instructions. Two  to three times a day to cause weight GAIN    [provider]  fluticasone furoate-vilanterol (BREO ELLIPTA) 100-25 MCG/INH AEPB Inhale 1 puff into the lungs daily. 06/28/16   Parrett, Fonnie Mu, NP  guaiFENesin (MUCINEX) 600 MG 12 hr tablet Take 1 tablet (600 mg total) by mouth 2 (two) times daily. 04/03/16   Barton Dubois, MD  HYDROcodone-acetaminophen (NORCO) 10-325 MG tablet Take 1 tablet every 6 (six) hours as needed by mouth. OK to fill 06/18/17 06/18/17   [name], MD  lidocaine (XYLOCAINE) 2 % solution Use as directed 20 mLs in the mouth or throat 2 (two) times daily as needed. 04/17/17   [name], MD  losartan (  COZAAR) 100 MG tablet TAKE ONE-HALF (1/2) TABLET DAILY 07/02/17   [name], MD  metoprolol succinate (TOPROL-XL) 50 MG 24 hr tablet TAKE 1 TABLET DAILY 06/17/17   [name], MD  Multiple Vitamin (MULTIVITAMIN WITH MINERALS) TABS tablet Take 1 tablet by mouth daily. Pt uses One-A-Day brand    [provider]  nystatin (MYCOSTATIN) 100000 UNIT/ML suspension Take 5 mLs (500,000 Units total) by mouth 4 (four) times daily. 03/19/17   [name], MD  omeprazole (PRILOSEC) 40 MG capsule Take 1 capsule (40 mg total) by mouth 2 (two) times daily. 04/03/16   Barton Dubois, MD  ondansetron (ZOFRAN-ODT) 8 MG disintegrating tablet PLACE 1 TABLET ON THE TONGUE EVERY 8 HOURS AS NEEDED FOR NAUSEA 05/17/17   [name], MD  OXYGEN Inhale 4 L into the lungs continuous.    [provider]  PROAIR HFA 108 [phone] Base) MCG/ACT inhaler USE 2 TO 4 INHALATIONS EVERY 4 TO 6 HOURS AS NEEDED 02/11/17   [name], MD  promethazine (PHENERGAN) 25 MG tablet Take 1 tablet (25 mg total) by mouth every 8 (eight) hours as needed for nausea or vomiting. 01/03/17   [name], MD  ranitidine (ZANTAC) 150 MG tablet Take 1 tablet (150 mg total) by mouth daily. 01/03/17   [name], MD  traMADol  (ULTRAM) 50 MG tablet Take 1 tablet (50 mg total) by mouth every 6 (six) hours as needed (pain). 01/14/17   [name], MD  trimethoprim-polymyxin b (POLYTRIM) ophthalmic solution One drop in effected eye every 6 hours for 7 days for conjunctivitis. 07/17/17   Breeback, Jade L, PA-C  umeclidinium bromide (INCRUSE ELLIPTA) 62.5 MCG/INH AEPB Inhale 1 puff into the lungs daily. 06/28/16   Parrett, Fonnie Mu, NP    Family History Family History  Problem Relation Age of Onset  . Hypertension Mother   . Ovarian cancer Mother   . Glaucoma Mother   . Glaucoma Sister   . Colon cancer Neg Hx   . Colon polyps Neg Hx   . Diabetes Neg Hx   . Kidney disease Neg Hx   . Gallbladder disease Neg Hx   . Esophageal cancer Neg Hx     Social History Social History   Tobacco Use  . Smoking status: Former Smoker    Packs/day: 0.25    Years: 57.00    Pack years: 14.25    Types: Cigarettes    Last attempt to quit: 07/25/2015    Years since quitting: 1.9  . Smokeless tobacco: Never Used  . Tobacco comment: 5 cigarettes a day  Substance Use Topics  . Alcohol use: No    Alcohol/week: 0.0 oz  . Drug use: No     Allergies   Maxidex [dexamethasone]; Vicodin [hydrocodone-acetaminophen]; Advair diskus [fluticasone-salmeterol]; Remeron [mirtazapine]; Trazodone and nefazodone; and Tylox [oxycodone-acetaminophen]   Review of Systems Review of Systems  Constitutional: Negative for activity change, fatigue and fever.  Respiratory: Negative for shortness of breath.   Cardiovascular: Negative for chest pain.  Gastrointestinal: Positive for vomiting. Negative for abdominal pain.  All other systems reviewed and are negative.    Physical Exam Updated Vital Signs BP (!) 139/58 (BP Location: Right Arm)   Pulse 88   Temp (!) 97.4 F (36.3 C) (Oral)   Resp (!) 24   Ht 5\' 4"  (1.626 m)   Wt 39.9 kg (88 lb)   SpO2 100%   BMI 15.11 kg/m   Physical Exam  Constitutional: She is oriented to person,  place, and time. She appears well-developed and well-nourished.  HENT:  Head: Normocephalic and atraumatic.  Eyes: Right eye exhibits no discharge.  Cardiovascular: Normal rate, regular rhythm and normal heart sounds.  No murmur heard. Pulmonary/Chest: Effort normal and breath sounds normal. She has no wheezes. She has no rales.  Abdominal: Soft. She exhibits no distension. There is tenderness in the epigastric area.  Neurological: She is oriented to person, place, and time.  Skin: Skin is warm and dry. She is not diaphoretic.  Psychiatric: She has a normal mood and affect.  Nursing note and vitals reviewed.    ED Treatments / Results  Labs (all labs ordered are listed, but only abnormal results are displayed) Labs Reviewed  COMPREHENSIVE METABOLIC PANEL - Abnormal; Notable for the following components:      Result Value   Chloride 93 (*)    Glucose, Bld 171 (*)    All other components within normal limits  CBC WITH DIFFERENTIAL/PLATELET - Abnormal; Notable for the following components:   Hemoglobin 11.0 (*)    HCT 34.8 (*)    All other components within normal limits  URINALYSIS, ROUTINE W REFLEX MICROSCOPIC    EKG  EKG Interpretation None       Radiology No results found.  Procedures Procedures (including critical care time)  Medications Ordered in ED Medications  albuterol (PROVENTIL) (2.5 MG/3ML) 0.083% nebulizer solution 2.5 mg (not administered)  morphine 4 MG/ML injection 4 mg (not administered)  ondansetron (ZOFRAN) injection 4 mg (not administered)     Initial Impression / Assessment and Plan / ED Course  I have reviewed the triage vital signs and the nursing notes.  Pertinent labs & imaging results that were available during my care of the patient were reviewed by me and considered in my medical decision making (see chart for details).    Patient is a 76 year old female with past medical history significant for COPD on 6  liters of home O2, history  of chronic abdominal pain, multiple surgeries, multiple small bowel obstruction's.  Patient is here today with vomiting and increased abdominal pain.  States it feels like a small bowel obstruction.  Patient has tenderness to abdomen.  Reports a small bowel movement this morning.  Has been placing flatus recently.  9:18 PM Will get CT abdomen pelvis to rule out small bowel obstruction.  Given patient's multiple histories of the same.  Patient's abdominal pain is more epigastric region which would be atypical and patient is passing flatus which will be more typical for a partial sbo. .  Signed out to follow up CT by oncomig provider.   Final Clinical Impressions(s) / ED Diagnoses   Final diagnoses:  None    ED Discharge Orders    None       Macarthur Critchley, MD 07/20/17 [phone]

## 2017-07-18 NOTE — ED Notes (Signed)
Pt vomiting contrast, CT notified.

## 2017-07-18 NOTE — ED Notes (Signed)
Nicki Reaper (Pt Son) [phone] to be notified of pt plan when possible.

## 2017-07-19 ENCOUNTER — Inpatient Hospital Stay (HOSPITAL_COMMUNITY): Payer: Medicare Other

## 2017-07-19 ENCOUNTER — Telehealth: Payer: Self-pay | Admitting: Physician Assistant

## 2017-07-19 ENCOUNTER — Emergency Department (HOSPITAL_BASED_OUTPATIENT_CLINIC_OR_DEPARTMENT_OTHER): Payer: Medicare Other

## 2017-07-19 ENCOUNTER — Other Ambulatory Visit: Payer: Self-pay

## 2017-07-19 DIAGNOSIS — I10 Essential (primary) hypertension: Secondary | ICD-10-CM | POA: Diagnosis not present

## 2017-07-19 DIAGNOSIS — D638 Anemia in other chronic diseases classified elsewhere: Secondary | ICD-10-CM | POA: Diagnosis present

## 2017-07-19 DIAGNOSIS — K567 Ileus, unspecified: Secondary | ICD-10-CM | POA: Diagnosis present

## 2017-07-19 DIAGNOSIS — Z961 Presence of intraocular lens: Secondary | ICD-10-CM | POA: Diagnosis present

## 2017-07-19 DIAGNOSIS — I4891 Unspecified atrial fibrillation: Secondary | ICD-10-CM | POA: Diagnosis not present

## 2017-07-19 DIAGNOSIS — R0602 Shortness of breath: Secondary | ICD-10-CM | POA: Diagnosis not present

## 2017-07-19 DIAGNOSIS — Z4659 Encounter for fitting and adjustment of other gastrointestinal appliance and device: Secondary | ICD-10-CM

## 2017-07-19 DIAGNOSIS — D649 Anemia, unspecified: Secondary | ICD-10-CM | POA: Diagnosis not present

## 2017-07-19 DIAGNOSIS — Z90721 Acquired absence of ovaries, unilateral: Secondary | ICD-10-CM | POA: Diagnosis not present

## 2017-07-19 DIAGNOSIS — Z86718 Personal history of other venous thrombosis and embolism: Secondary | ICD-10-CM | POA: Diagnosis not present

## 2017-07-19 DIAGNOSIS — Z90710 Acquired absence of both cervix and uterus: Secondary | ICD-10-CM | POA: Diagnosis not present

## 2017-07-19 DIAGNOSIS — R05 Cough: Secondary | ICD-10-CM | POA: Diagnosis not present

## 2017-07-19 DIAGNOSIS — K219 Gastro-esophageal reflux disease without esophagitis: Secondary | ICD-10-CM | POA: Diagnosis present

## 2017-07-19 DIAGNOSIS — I472 Ventricular tachycardia: Secondary | ICD-10-CM | POA: Diagnosis not present

## 2017-07-19 DIAGNOSIS — K5651 Intestinal adhesions [bands], with partial obstruction: Secondary | ICD-10-CM | POA: Diagnosis present

## 2017-07-19 DIAGNOSIS — E43 Unspecified severe protein-calorie malnutrition: Secondary | ICD-10-CM | POA: Diagnosis present

## 2017-07-19 DIAGNOSIS — G894 Chronic pain syndrome: Secondary | ICD-10-CM | POA: Diagnosis present

## 2017-07-19 DIAGNOSIS — R509 Fever, unspecified: Secondary | ICD-10-CM | POA: Diagnosis not present

## 2017-07-19 DIAGNOSIS — Z9981 Dependence on supplemental oxygen: Secondary | ICD-10-CM | POA: Diagnosis not present

## 2017-07-19 DIAGNOSIS — F418 Other specified anxiety disorders: Secondary | ICD-10-CM | POA: Diagnosis present

## 2017-07-19 DIAGNOSIS — I25119 Atherosclerotic heart disease of native coronary artery with unspecified angina pectoris: Secondary | ICD-10-CM | POA: Diagnosis not present

## 2017-07-19 DIAGNOSIS — K56609 Unspecified intestinal obstruction, unspecified as to partial versus complete obstruction: Secondary | ICD-10-CM | POA: Diagnosis present

## 2017-07-19 DIAGNOSIS — K56600 Partial intestinal obstruction, unspecified as to cause: Secondary | ICD-10-CM | POA: Diagnosis not present

## 2017-07-19 DIAGNOSIS — J690 Pneumonitis due to inhalation of food and vomit: Secondary | ICD-10-CM | POA: Diagnosis not present

## 2017-07-19 DIAGNOSIS — E876 Hypokalemia: Secondary | ICD-10-CM | POA: Diagnosis not present

## 2017-07-19 DIAGNOSIS — I219 Acute myocardial infarction, unspecified: Secondary | ICD-10-CM

## 2017-07-19 DIAGNOSIS — I700 Atherosclerosis of aorta: Secondary | ICD-10-CM | POA: Diagnosis present

## 2017-07-19 DIAGNOSIS — Z973 Presence of spectacles and contact lenses: Secondary | ICD-10-CM | POA: Diagnosis not present

## 2017-07-19 DIAGNOSIS — I214 Non-ST elevation (NSTEMI) myocardial infarction: Secondary | ICD-10-CM | POA: Diagnosis not present

## 2017-07-19 DIAGNOSIS — J9611 Chronic respiratory failure with hypoxia: Secondary | ICD-10-CM | POA: Diagnosis not present

## 2017-07-19 DIAGNOSIS — Z4682 Encounter for fitting and adjustment of non-vascular catheter: Secondary | ICD-10-CM | POA: Diagnosis not present

## 2017-07-19 DIAGNOSIS — I480 Paroxysmal atrial fibrillation: Secondary | ICD-10-CM | POA: Diagnosis not present

## 2017-07-19 DIAGNOSIS — N179 Acute kidney failure, unspecified: Secondary | ICD-10-CM | POA: Diagnosis present

## 2017-07-19 DIAGNOSIS — I481 Persistent atrial fibrillation: Secondary | ICD-10-CM | POA: Diagnosis not present

## 2017-07-19 DIAGNOSIS — Z681 Body mass index (BMI) 19 or less, adult: Secondary | ICD-10-CM | POA: Diagnosis not present

## 2017-07-19 DIAGNOSIS — J449 Chronic obstructive pulmonary disease, unspecified: Secondary | ICD-10-CM | POA: Diagnosis not present

## 2017-07-19 HISTORY — DX: Acute myocardial infarction, unspecified: I21.9

## 2017-07-19 LAB — BASIC METABOLIC PANEL
Anion gap: 11 (ref 5–15)
BUN: 12 mg/dL (ref 6–20)
CHLORIDE: 92 mmol/L — AB (ref 101–111)
CO2: 32 mmol/L (ref 22–32)
Calcium: 9.2 mg/dL (ref 8.9–10.3)
Creatinine, Ser: 0.9 mg/dL (ref 0.44–1.00)
GFR calc Af Amer: >60 mL/min (ref >60)
GFR calc non Af Amer: >60 mL/min (ref >60)
GLUCOSE: 150 mg/dL — AB (ref 65–99)
POTASSIUM: 4.1 mmol/L (ref 3.5–5.1)
SODIUM: 135 mmol/L (ref 135–145)

## 2017-07-19 MED ORDER — ACETAMINOPHEN 325 MG PO TABS
650.0000 mg | ORAL_TABLET | Freq: Four times a day (QID) | ORAL | Status: DC | PRN
  Administered 2017-07-24 – 2017-07-27 (×6): 650 mg via ORAL
  Filled 2017-07-19 (×7): qty 2

## 2017-07-19 MED ORDER — LOSARTAN POTASSIUM 50 MG PO TABS: 50.0000 mg | ORAL_TABLET | Freq: Every day | ORAL | Status: DC

## 2017-07-19 MED ORDER — AMLODIPINE BESYLATE 5 MG PO TABS: 5.0000 mg | ORAL_TABLET | Freq: Every day | ORAL | Status: DC

## 2017-07-19 MED ORDER — SODIUM CHLORIDE 0.9 % IV SOLN: Freq: Once | INTRAVENOUS | Status: AC

## 2017-07-19 MED ORDER — PROMETHAZINE HCL 25 MG/ML IJ SOLN
6.2500 mg | Freq: Four times a day (QID) | INTRAMUSCULAR | Status: DC | PRN
  Administered 2017-07-19 – 2017-07-20 (×4): 12.5 mg via INTRAVENOUS
  Administered 2017-07-20: 6.25 mg via INTRAVENOUS
  Administered 2017-07-20: 12.5 mg via INTRAVENOUS
  Administered 2017-07-21: 6.25 mg via INTRAVENOUS
  Administered 2017-07-21 – 2017-07-22 (×2): 12.5 mg via INTRAVENOUS
  Administered 2017-07-22: 6.25 mg via INTRAVENOUS
  Administered 2017-07-26: 12.5 mg via INTRAVENOUS
  Filled 2017-07-19 (×11): qty 1

## 2017-07-19 MED ORDER — ONDANSETRON HCL 4 MG/2ML IJ SOLN
4.0000 mg | Freq: Once | INTRAMUSCULAR | Status: AC
  Administered 2017-07-19: 4 mg via INTRAVENOUS

## 2017-07-19 MED ORDER — SODIUM CHLORIDE 0.9 % IV SOLN
INTRAVENOUS | Status: DC
  Administered 2017-07-19: 01:00:00 via INTRAVENOUS

## 2017-07-19 MED ORDER — PANTOPRAZOLE SODIUM 40 MG PO TBEC: 80.0000 mg | DELAYED_RELEASE_TABLET | Freq: Every day | ORAL | Status: DC

## 2017-07-19 MED ORDER — HYDRALAZINE HCL 20 MG/ML IJ SOLN
5.0000 mg | INTRAMUSCULAR | Status: DC | PRN
  Administered 2017-07-23 – 2017-07-26 (×2): 5 mg via INTRAVENOUS
  Filled 2017-07-19 (×3): qty 1

## 2017-07-19 MED ORDER — FLUTICASONE FUROATE-VILANTEROL 100-25 MCG/INH IN AEPB
1.0000 | INHALATION_SPRAY | Freq: Every day | RESPIRATORY_TRACT | Status: DC
  Administered 2017-07-19 – 2017-07-27 (×9): 1 via RESPIRATORY_TRACT
  Filled 2017-07-19: qty 28

## 2017-07-19 MED ORDER — ALBUTEROL SULFATE HFA 108 (90 BASE) MCG/ACT IN AERS: 1.0000 | INHALATION_SPRAY | RESPIRATORY_TRACT | Status: DC | PRN

## 2017-07-19 MED ORDER — METOPROLOL TARTRATE 5 MG/5ML IV SOLN
5.0000 mg | Freq: Four times a day (QID) | INTRAVENOUS | Status: DC
  Administered 2017-07-19 – 2017-07-21 (×8): 5 mg via INTRAVENOUS
  Filled 2017-07-19 (×8): qty 5

## 2017-07-19 MED ORDER — ONDANSETRON HCL 4 MG PO TABS: 4.0000 mg | ORAL_TABLET | Freq: Four times a day (QID) | ORAL | Status: DC | PRN

## 2017-07-19 MED ORDER — SODIUM CHLORIDE 0.9 % IV SOLN
INTRAVENOUS | Status: DC
  Administered 2017-07-19 – 2017-07-21 (×5): via INTRAVENOUS

## 2017-07-19 MED ORDER — ALBUTEROL SULFATE (2.5 MG/3ML) 0.083% IN NEBU
2.5000 mg | INHALATION_SOLUTION | Freq: Four times a day (QID) | RESPIRATORY_TRACT | Status: DC | PRN
  Administered 2017-07-21: 2.5 mg via RESPIRATORY_TRACT
  Filled 2017-07-19: qty 3

## 2017-07-19 MED ORDER — ADULT MULTIVITAMIN W/MINERALS CH: 1.0000 | ORAL_TABLET | Freq: Every day | ORAL | Status: DC

## 2017-07-19 MED ORDER — METOPROLOL TARTRATE 5 MG/5ML IV SOLN: 10.0000 mg | Freq: Every day | INTRAVENOUS | Status: DC

## 2017-07-19 MED ORDER — PIPERACILLIN-TAZOBACTAM 3.375 G IVPB 30 MIN
3.3750 g | Freq: Once | INTRAVENOUS | Status: DC
  Filled 2017-07-19: qty 50

## 2017-07-19 MED ORDER — METOPROLOL TARTRATE 5 MG/5ML IV SOLN: 2.5000 mg | Freq: Three times a day (TID) | INTRAVENOUS | Status: DC

## 2017-07-19 MED ORDER — ACETAMINOPHEN 650 MG RE SUPP: 650.0000 mg | Freq: Four times a day (QID) | RECTAL | Status: DC | PRN

## 2017-07-19 MED ORDER — GUAIFENESIN ER 600 MG PO TB12: 600.0000 mg | ORAL_TABLET | Freq: Two times a day (BID) | ORAL | Status: DC

## 2017-07-19 MED ORDER — LIDOCAINE VISCOUS 2 % MT SOLN
20.0000 mL | Freq: Two times a day (BID) | OROMUCOSAL | Status: DC | PRN
  Filled 2017-07-19: qty 30

## 2017-07-19 MED ORDER — POLYMYXIN B-TRIMETHOPRIM 10000-0.1 UNIT/ML-% OP SOLN
1.0000 [drp] | Freq: Four times a day (QID) | OPHTHALMIC | Status: DC
  Administered 2017-07-19 – 2017-07-27 (×23): 1 [drp] via OPHTHALMIC
  Filled 2017-07-19 (×2): qty 10

## 2017-07-19 MED ORDER — ORAL CARE MOUTH RINSE
15.0000 mL | Freq: Two times a day (BID) | OROMUCOSAL | Status: DC
  Administered 2017-07-19 – 2017-07-27 (×12): 15 mL via OROMUCOSAL

## 2017-07-19 MED ORDER — FAMOTIDINE 20 MG PO TABS: 20.0000 mg | ORAL_TABLET | Freq: Every day | ORAL | Status: DC

## 2017-07-19 MED ORDER — METOPROLOL SUCCINATE ER 50 MG PO TB24: 50.0000 mg | ORAL_TABLET | Freq: Every day | ORAL | Status: DC

## 2017-07-19 MED ORDER — DIATRIZOATE MEGLUMINE & SODIUM 66-10 % PO SOLN
90.0000 mL | Freq: Once | ORAL | Status: AC
  Administered 2017-07-19: 90 mL via NASOGASTRIC
  Filled 2017-07-19: qty 90

## 2017-07-19 MED ORDER — ONDANSETRON HCL 4 MG/2ML IJ SOLN
INTRAMUSCULAR | Status: AC
  Filled 2017-07-19: qty 2

## 2017-07-19 MED ORDER — HYDROMORPHONE HCL 1 MG/ML IJ SOLN
0.5000 mg | INTRAMUSCULAR | Status: DC | PRN
  Administered 2017-07-19 – 2017-07-27 (×38): 0.5 mg via INTRAVENOUS
  Filled 2017-07-19: qty 1
  Filled 2017-07-19 (×4): qty 0.5
  Filled 2017-07-19: qty 1
  Filled 2017-07-19: qty 0.5
  Filled 2017-07-19: qty 1
  Filled 2017-07-19 (×5): qty 0.5
  Filled 2017-07-19: qty 1
  Filled 2017-07-19 (×8): qty 0.5
  Filled 2017-07-19: qty 1
  Filled 2017-07-19: qty 0.5
  Filled 2017-07-19: qty 1
  Filled 2017-07-19: qty 0.5
  Filled 2017-07-19: qty 1
  Filled 2017-07-19 (×3): qty 0.5
  Filled 2017-07-19: qty 1
  Filled 2017-07-19: qty 0.5
  Filled 2017-07-19: qty 1
  Filled 2017-07-19 (×2): qty 0.5
  Filled 2017-07-19 (×2): qty 1
  Filled 2017-07-19: qty 0.5
  Filled 2017-07-19: qty 1

## 2017-07-19 MED ORDER — ONDANSETRON HCL 4 MG/2ML IJ SOLN
4.0000 mg | Freq: Four times a day (QID) | INTRAMUSCULAR | Status: DC | PRN
  Administered 2017-07-19 – 2017-07-27 (×18): 4 mg via INTRAVENOUS
  Filled 2017-07-19 (×18): qty 2

## 2017-07-19 MED ORDER — UMECLIDINIUM BROMIDE 62.5 MCG/INH IN AEPB
1.0000 | INHALATION_SPRAY | Freq: Every day | RESPIRATORY_TRACT | Status: DC
  Administered 2017-07-19 – 2017-07-27 (×9): 1 via RESPIRATORY_TRACT
  Filled 2017-07-19 (×2): qty 7

## 2017-07-19 MED ORDER — METOCLOPRAMIDE HCL 5 MG/ML IJ SOLN
5.0000 mg | Freq: Once | INTRAMUSCULAR | Status: AC
  Administered 2017-07-19: 5 mg via INTRAVENOUS
  Filled 2017-07-19: qty 2

## 2017-07-19 MED ORDER — ENOXAPARIN SODIUM 30 MG/0.3ML ~~LOC~~ SOLN
30.0000 mg | Freq: Every day | SUBCUTANEOUS | Status: DC
  Administered 2017-07-19 – 2017-07-20 (×2): 30 mg via SUBCUTANEOUS
  Filled 2017-07-19 (×2): qty 0.3

## 2017-07-19 NOTE — Telephone Encounter (Signed)
[facility] called and stated there was a faxed prescription for Marinol sent that was not signed for the patient.  [facility] Number [phone]

## 2017-07-19 NOTE — Progress Notes (Signed)
Gastrografin given at 1608.  NG tube clamped for one hour.  Radiology made aware.

## 2017-07-19 NOTE — ED Notes (Signed)
Patient transported to X-ray 

## 2017-07-19 NOTE — Progress Notes (Signed)
Rx Brief note: Lovenox  Wt=38 kg, CrCl~36 ml/min Rx adjusted Lovenox to 30 mg daily for dvt prophylaxis in pt with wt<45 kg  Thanks Dorrene German 07/19/2017 4:11 AM  .

## 2017-07-19 NOTE — H&P (Signed)
History and Physical    Tamara Adams QIH:[id number] DOB: 1940/11/12 DOA: 07/18/2017  PCP: [name], MD  Patient coming from: Home  I have personally briefly reviewed patient's old medical records in [place]  Chief Complaint: Abd pain  HPI: Tamara Adams is a 76 y.o. female with medical history significant of COPD on 6L home O2, chronic abd pain, multiple surgeries, multiple SBOs (at least 2x before if not more).  Patient presents to the ED at [facility] today with c/o increased abd pain, nausea, vomiting.  Patient states (correctly) that she thinks she may have SBO again.  Small BM this morning, is passing flatus recently some.   ED Course: Does have SBO on CT scan.  NGT placed.  Transferred for admit.   Review of Systems: As per HPI otherwise 10 point review of systems negative.   Past Medical History:  Diagnosis Date  . Anemia    as a child  . Arthritis    "hands" (12/24/2014)  . Complication of anesthesia    " My blood gas dropped during surgery so I was left on a ventilator and in ICU for 3 days."  . COPD (chronic obstructive pulmonary disease) (HCC)    FVC 72%, FEV1 29%, FEV1 ratio 32% (very severe (COPD)  . COPD (chronic obstructive pulmonary disease) ([place])   . Degenerative disc disease   . Depression with anxiety   . Diverticulosis   . DVT (deep venous thrombosis) ([place])    "LLE; years ago after a surgery"  . Essential hypertension   . Fluttering sensation of heart    pt put on metoprolol as a result  . GERD (gastroesophageal reflux disease)    PMH  . H/O hiatal hernia   . Headache    "maybe weekly" (12/24/2014)  . Hyperlipidemia   . Hypertension   . Kidney stone   . Liver lesion   . On home oxygen therapy    "3L; sleep w/it; use it when I rest" (12/24/2014)  . Peptic ulcer   . Pinched nerve    in back  . Pneumonia   . PONV (postoperative nausea and vomiting)   . Shortness of breath dyspnea    with exertion  . Skipped heart beats   . Small  bowel obstruction ([place])    "several times; OR twice" (12/24/2014)  . Tubular adenoma of colon 09/2011  . Wears glasses     Past Surgical History:  Procedure Laterality Date  . ABDOMINAL ADHESION SURGERY  12/24/2014  . ABDOMINAL HYSTERECTOMY  1987   and one ovary  . CATARACT EXTRACTION W/ INTRAOCULAR LENS  IMPLANT, BILATERAL Right 06/22/2012   Dr. Lester Kinsman.   Marland Kitchen CATARACT EXTRACTION W/PHACO Left 06/12/2012   Dr. Boykin Reaper  . CHOLECYSTECTOMY N/A 08/18/2013   Procedure: LAPAROSCOPIC CHOLECYSTECTOMY;  Surgeon: Harl Bowie, MD;  Location: [place];  Service: General;  Laterality: N/A;  . DILATION AND CURETTAGE OF UTERUS    . EXPLORATORY LAPAROTOMY  12/24/2014  . HERNIA REPAIR    . INCISIONAL HERNIA REPAIR  12/24/2014  . INCISIONAL HERNIA REPAIR N/A 12/24/2014   Procedure: HERNIA REPAIR INCISIONAL;  Surgeon: Coralie Keens, MD;  Location: [place];  Service: General;  Laterality: N/A;  . LAPAROTOMY N/A 12/24/2014   Procedure: EXPLORATORY LAPAROTOMY;  Surgeon: Coralie Keens, MD;  Location: [place];  Service: General;  Laterality: N/A;  . LYSIS OF ADHESION N/A 12/24/2014   Procedure: LYSIS OF ADHESION;  Surgeon: Coralie Keens, MD;  Location: [place];  Service: General;  Laterality: N/A;  . MULTIPLE TOOTH EXTRACTIONS    . OOPHORECTOMY  1992  . SMALL INTESTINE SURGERY     for a blockage, no bowel was removed, just kinked from scar tissue  . TUBAL LIGATION    . URETHRAL DILATION       reports that she quit smoking about 1 years ago. Her smoking use included cigarettes. She has a 14.25 pack-year smoking history. she has never used smokeless tobacco. She reports that she does not drink alcohol or use drugs.  Allergies  Allergen Reactions  . Maxidex [Dexamethasone] Other (See Comments)    DEHYDRATION AND HEART RACING  . Advair Diskus [Fluticasone-Salmeterol] Other (See Comments)    No benefit with lungs (INEFFECTIVE)  . Remeron [Mirtazapine] Other (See Comments)    CAUSED NIGHTMARES  .  Trazodone And Nefazodone Other (See Comments)    Heart pounding  . Tylox [Oxycodone-Acetaminophen] Itching    Family History  Problem Relation Age of Onset  . Hypertension Mother   . Ovarian cancer Mother   . Glaucoma Mother   . Glaucoma Sister   . Colon cancer Neg Hx   . Colon polyps Neg Hx   . Diabetes Neg Hx   . Kidney disease Neg Hx   . Gallbladder disease Neg Hx   . Esophageal cancer Neg Hx      Prior to Admission medications   Medication Sig Start Date End Date Taking? Authorizing Provider  acetaminophen (TYLENOL) 325 MG tablet Take 325-650 mg by mouth every 6 (six) hours as needed for headache.   Yes [provider]  albuterol (PROVENTIL) (2.5 MG/3ML) 0.083% nebulizer solution USE 1 VIAL (3 ML) VIA NEBULIZER EVERY 6 HOURS AS NEEDED FOR WHEEZING OR SHORTNESS OF BREATH 02/11/17  Yes [name], MD  amLODipine (NORVASC) 5 MG tablet TAKE 1 TABLET DAILY 11/02/16  Yes [name], MD  dronabinol (MARINOL) 2.5 MG capsule Take 1 capsule (2.5 mg total) by mouth 2 (two) times daily. 07/17/17  Yes Breeback, Jade L, PA-C  fluticasone furoate-vilanterol (BREO ELLIPTA) 100-25 MCG/INH AEPB Inhale 1 puff into the lungs daily. 06/28/16  Yes Parrett, Tammy S, NP  guaiFENesin (MUCINEX) 600 MG 12 hr tablet Take 1 tablet (600 mg total) by mouth 2 (two) times daily. 04/03/16  Yes Barton Dubois, MD  HYDROcodone-acetaminophen (NORCO) 10-325 MG tablet Take 1 tablet every 6 (six) hours as needed by mouth. OK to fill 06/18/17 Patient taking differently: Take 1 tablet by mouth every 6 (six) hours as needed for moderate pain. OK to fill 06/18/17 06/18/17  Yes [name], MD  lidocaine (XYLOCAINE) 2 % solution Use as directed 20 mLs in the mouth or throat 2 (two) times daily as needed. Patient taking differently: Use as directed 20 mLs in the mouth or throat 2 (two) times daily as needed for mouth pain.  04/17/17  Yes [name], MD  losartan (COZAAR) 100 MG  tablet TAKE ONE-HALF (1/2) TABLET DAILY Patient taking differently: TAKE50 MG TABLET DAILY 07/02/17  Yes [name], MD  metoprolol succinate (TOPROL-XL) 50 MG 24 hr tablet TAKE 1 TABLET DAILY 06/17/17  Yes [name], MD  Multiple Vitamin (MULTIVITAMIN WITH MINERALS) TABS tablet Take 1 tablet by mouth daily. Pt uses One-A-Day brand   Yes [provider]  nystatin (MYCOSTATIN) 100000 UNIT/ML suspension Take 5 mLs (500,000 Units total) by mouth 4 (four) times daily. 03/19/17  Yes [name], MD  omeprazole (PRILOSEC) 40 MG capsule  Take 1 capsule (40 mg total) by mouth 2 (two) times daily. 04/03/16  Yes Barton Dubois, MD  ondansetron (ZOFRAN-ODT) 8 MG disintegrating tablet PLACE 1 TABLET ON THE TONGUE EVERY 8 HOURS AS NEEDED FOR NAUSEA 05/17/17  Yes [name], MD  PROAIR HFA 108 [phone] Base) MCG/ACT inhaler USE 2 TO 4 INHALATIONS EVERY 4 TO 6 HOURS AS NEEDED 02/11/17  Yes [name], MD  promethazine (PHENERGAN) 25 MG tablet Take 1 tablet (25 mg total) by mouth every 8 (eight) hours as needed for nausea or vomiting. 01/03/17  Yes [name], MD  ranitidine (ZANTAC) 150 MG tablet Take 1 tablet (150 mg total) by mouth daily. 01/03/17  Yes [name], MD  traMADol (ULTRAM) 50 MG tablet Take 1 tablet (50 mg total) by mouth every 6 (six) hours as needed (pain). 01/14/17  Yes [name], MD  trimethoprim-polymyxin b (POLYTRIM) ophthalmic solution One drop in effected eye every 6 hours for 7 days for conjunctivitis. 07/17/17  Yes Breeback, Jade L, PA-C  umeclidinium bromide (INCRUSE ELLIPTA) 62.5 MCG/INH AEPB Inhale 1 puff into the lungs daily. 06/28/16  Yes Parrett, Tammy S, NP  AMBULATORY NON FORMULARY MEDICATION Medication Name: Needs oxygen concentrator set to 6 liter per minutes as she has a long tubing on her machine.  Fax to [name] 08/27/16   [name], MD  AMBULATORY NON FORMULARY MEDICATION Medication  Name: GI cocktail: 151mL Lidocaine2%, 144ml Maalox, 27ml Donnatol. Ok to take twice daily. 03/28/17   [name], MD  AMBULATORY NON FORMULARY MEDICATION Wheel Chair 07/10/17   [name], MD  AMBULATORY NON FORMULARY MEDICATION PT and OT Evaluation and Treatment. 07/10/17   [name], MD  OXYGEN Inhale 4 L into the lungs continuous.    [provider]    Physical Exam: Vitals:   07/18/17 2203 07/19/17 0000 07/19/17 0100 07/19/17 0251  BP: (!) 169/69 (!) 141/69 (!) 180/85 (!) 166/73  Pulse: 76 77 92 81  Resp: (!) 24  18 18   Temp:    (!) 97.5 F (36.4 C)  TempSrc:    Oral  SpO2: 99% 100% 100% 100%  Weight:    38.3 kg (84 lb 7 oz)  Height:    5\' 3"  (1.6 m)    Constitutional: NAD, calm, comfortable Eyes: PERRL, lids and conjunctivae normal ENMT: Mucous membranes are moist. Posterior pharynx clear of any exudate or lesions.Normal dentition.  Neck: normal, supple, no masses, no thyromegaly Respiratory: clear to auscultation bilaterally, no wheezing, no crackles. Normal respiratory effort. No accessory muscle use.  Cardiovascular: Regular rate and rhythm, no murmurs / rubs / gallops. No extremity edema. 2+ pedal pulses. No carotid bruits.  Abdomen: no tenderness, no masses palpated. No hepatosplenomegaly. Bowel sounds positive.  Musculoskeletal: no clubbing / cyanosis. No joint deformity upper and lower extremities. Good ROM, no contractures. Normal muscle tone.  Skin: no rashes, lesions, ulcers. No induration Neurologic: CN 2-12 grossly intact. Sensation intact, DTR normal. Strength 5/5 in all 4.  Psychiatric: Normal judgment and insight. Alert and oriented x 3. Normal mood.    Labs on Admission: I have personally reviewed following labs and imaging studies  CBC: Recent Labs  Lab 07/18/17 2033  WBC 10.3  NEUTROABS 7.6  HGB 11.0*  HCT 34.8*  MCV 86.6  PLT 347   Basic Metabolic Panel: Recent Labs  Lab 07/18/17 2040  NA 135  K 4.3    CL 93*  CO2 31  GLUCOSE  171*  BUN 9  CREATININE 0.77  CALCIUM 9.3   GFR: Estimated Creatinine Clearance: 36.2 mL/min (by C-G formula based on SCr of 0.77 mg/dL). Liver Function Tests: Recent Labs  Lab 07/18/17 2040  AST 28  ALT 15  ALKPHOS 74  BILITOT 0.4  PROT 7.3  ALBUMIN 4.1   No results for input(s): LIPASE, AMYLASE in the last 168 hours. No results for input(s): AMMONIA in the last 168 hours. Coagulation Profile: No results for input(s): INR, PROTIME in the last 168 hours. Cardiac Enzymes: No results for input(s): CKTOTAL, CKMB, CKMBINDEX, TROPONINI in the last 168 hours. BNP (last 3 results) No results for input(s): PROBNP in the last 8760 hours. HbA1C: No results for input(s): HGBA1C in the last 72 hours. CBG: No results for input(s): GLUCAP in the last 168 hours. Lipid Profile: No results for input(s): CHOL, HDL, LDLCALC, TRIG, CHOLHDL, LDLDIRECT in the last 72 hours. Thyroid Function Tests: No results for input(s): TSH, T4TOTAL, FREET4, T3FREE, THYROIDAB in the last 72 hours. Anemia Panel: No results for input(s): VITAMINB12, FOLATE, FERRITIN, TIBC, IRON, RETICCTPCT in the last 72 hours. Urine analysis:    Component Value Date/Time   COLORURINE YELLOW 03/30/2016 1635   APPEARANCEUR CLOUDY (A) 03/30/2016 1635   LABSPEC 1.019 03/30/2016 1635   PHURINE 7.0 03/30/2016 1635   GLUCOSEU NEGATIVE 03/30/2016 1635   HGBUR TRACE (A) 03/30/2016 1635   BILIRUBINUR neg 05/04/2016 1525   KETONESUR 15 (A) 03/30/2016 1635   PROTEINUR 30 05/04/2016 1525   PROTEINUR 100 (A) 03/30/2016 1635   UROBILINOGEN 0.2 05/04/2016 1525   UROBILINOGEN 0.2 03/06/2015 2133   NITRITE neg 05/04/2016 1525   NITRITE NEGATIVE 03/30/2016 1635   LEUKOCYTESUR Negative 05/04/2016 1525    Radiological Exams on Admission: Dg Abdomen 1 View  Result Date: 07/19/2017 CLINICAL DATA:  Nasogastric tube placement. EXAM: ABDOMEN - 1 VIEW COMPARISON:  CT of the abdomen and pelvis performed  07/18/2017 FINDINGS: The patient's nasogastric tube is seen ending overlying the distal esophagus. If it has been fully advanced, this may be coiled within the patient's mouth. It could be advanced 10 cm. Residual contrast is noted within the renal collecting systems and bladder. A distended small bowel loop is noted at the right lower quadrant, measuring 4.0 cm in diameter. On evaluation of the prior CT, there appears to be twisting of several loops of bowel about a single point at the lower abdomen, with associated effacement of vasculature. This is suspicious for an internal hernia with associated bowel volvulus resulting in obstruction, and is unlikely to resolve on its own. A small to moderate amount of stool is noted within the colon. Mild degenerative change is noted along the lumbar spine. The visualized lung bases are grossly clear. IMPRESSION: 1. Nasogastric tube noted ending overlying the distal esophagus. If it has been fully advanced, this may be coiled within the patient's mouth. It could be advanced 10 cm. 2. On evaluation of prior CT, there appears to be twisting of several loops of bowel about a single point at the lower abdomen, with associated effacement of vasculature. This is suspicious for internal hernia with associated bowel volvulus resulting in obstruction, and is unlikely to resolve on its own. These results were called by telephone at the time of interpretation on 07/19/2017 at 12:58 am to Dr. Orvilla Cornwall, who verbally acknowledged these results. Electronically Signed   By: Garald Balding M.D.   On: 07/19/2017 00:59   Ct Abdomen Pelvis W Contrast  Result Date: 07/18/2017  CLINICAL DATA:  Abdominal pain and nausea EXAM: CT ABDOMEN AND PELVIS WITH CONTRAST TECHNIQUE: Multidetector CT imaging of the abdomen and pelvis was performed using the standard protocol following bolus administration of intravenous contrast. CONTRAST:  45mL ISOVUE-300 IOPAMIDOL (ISOVUE-300) INJECTION 61%  COMPARISON:  07/04/2017, 12/02/2015, 03/06/2015, 10/04/2014 FINDINGS: Lower chest: Lung bases demonstrate emphysema. Heart size within normal limits. Hepatobiliary: Hypodense lesions within the left and right hepatic lobes, stable. Surgical clips at the gallbladder fossa. Minimal intra hepatic biliary dilatation. Pancreas: Unremarkable. No pancreatic or surrounding inflammatory changes. Prominent pancreatic duct unchanged. Spleen: Normal in size without focal abnormality. Adrenals/Urinary Tract: Adrenal glands are unremarkable. Kidneys are without hydronephrosis. subcentimeter hypodensities in the kidneys, too small to further characterize but probably cysts. Bladder is unremarkable. Stomach/Bowel: Stomach is distended. Multiple loops of dilated fluid-filled small bowel, measuring up to 3.6 cm with suspected transition point in the mid small bowel, central pelvis, series 2, image number 53. Small bowel distal to this is decompressed. Moderate stool in the colon. Sigmoid colon diverticula without acute inflammation. Vascular/Lymphatic: Moderate to marked atherosclerosis of the aorta. No aneurysmal dilatation. No significantly enlarged lymph nodes Reproductive: Status post hysterectomy. No adnexal masses. Other: Negative for free air or free fluid. Musculoskeletal: Scoliosis of the spine with degenerative changes. No acute or suspicious abnormality. IMPRESSION: 1. Gastric distention with multiple dilated loops of fluid-filled small bowel with suspected transition point in the central pelvis, consistent with mechanical small bowel obstruction. Negative for free air. 2. Re- demonstrated hypodense liver lesions, unchanged. 3. Emphysema at the lung bases Electronically Signed   By: Donavan Foil M.D.   On: 07/18/2017 23:25    EKG: Independently reviewed.  Assessment/Plan Principal Problem:   SBO (small bowel obstruction) (HCC) Active Problems:   COPD (chronic obstructive pulmonary disease) (HCC)   Chronic  hypoxemic respiratory failure (HCC)   Essential hypertension    1. SBO - 1. Surg consulted for AM 1. But strongly suspect this will be medical management 2. NGT in place 1. Will re-xray to confirm positioning 3. NPO 4. IVF 5. Repeat BMP in am 6. Zofran for nausea 7. Dilaudid prn pain 8. Phenergan prn breakthrough nausea 2. COPD - 1. Continue home nebs 2. Continue home O2 6L 3. HTN - continue home BP meds  DVT prophylaxis: Lovenox Code Status: Full Family Communication: No family in room Disposition Plan: Home after admit Consults called: EDP spoke with Dr. Harlow Asa, gen surg to see in AM Admission status: Admit to inpatient - inpatient status for NGT treatment of SBO   Tamara Adams, [facility] Pager [phone]  If 7AM-7PM, please contact day team taking care of patient [email or web address] Password [facility]  07/19/2017, 4:13 AM

## 2017-07-19 NOTE — ED Provider Notes (Signed)
EKG Interpretation  Date/Time:  Friday July 19 2017 00:48:52 EST Ventricular Rate:  70 PR Interval:    QRS Duration: 85 QT Interval:  388 QTC Calculation: 419 R Axis:   72 Text Interpretation:  Sinus rhythm Borderline short PR interval Minimal ST depression, diffuse leads No significant change was found Confirmed by Shanon Rosser [phone]) on 07/19/2017 12:52:11 AM      Dr. Alcario Drought accepts for admission to Hospitalist service. Dr. Harlow Asa of general surgery will see in consult.     Prospero Mahnke, Jenny Reichmann, MD 07/19/17 [phone]

## 2017-07-19 NOTE — Progress Notes (Addendum)
Initial Nutrition Assessment  DOCUMENTATION CODES:   Underweight, Severe malnutrition in context of chronic illness  INTERVENTION:    Monitor for diet advancement/toleration  Boost Breeze po TID, each supplement provides 250 kcal and 9 grams of protein once advanced.  NUTRITION DIAGNOSIS:   Severe Malnutrition related to chronic illness(COPD) as evidenced by severe fat depletion, severe muscle depletion.  GOAL:   Patient will meet greater than or equal to 90% of their needs  MONITOR:   PO intake, Supplement acceptance, Weight trends, Labs  REASON FOR ASSESSMENT:   Other (Comment)(low BMI)    ASSESSMENT:   Pt with PMH significant for COPD on 6L O2 at home, multiple SBOs with multiple surgeries, HTN, HLD, and DVT. Presented to [facility] with increased abdominal pain, nausea, vomiting. Pt transferred for admission. CT scan shows SBO. Pt has NGT to intermittent suction.    RD observed 500 ml of green NGT output at bedside. Spoke with pt regarding appetite PTA. States she is not a big eater due to chronic abdominal pain, but over the last two weeks her intake dropped to 1 meal and 1 snack per day. Food options include "junk food like cookies, spaghetti, and broccoli salad." Clinical RD saw pt last year and suggested to pt drinking 2 Ensures at home. Pt tries to each day but typically only finishes one. Will provide supplementation once diet advanced, pt amendable.    Records indicate pt has maintained her weight of 88-84 lb since the beginning of the year. Denies any recent wt loss. Nutrition-Focused physical exam completed. Pt remains chronically malnourished. Will continue to monitor for needs once medically feasible.   Medications reviewed and include: NS @ 75 ml/hr Labs reviewed: Cl 92 (L)   NUTRITION - FOCUSED PHYSICAL EXAM:    Most Recent Value  Orbital Region  Moderate depletion  Upper Arm Region  Severe depletion  Thoracic and Lumbar Region  Severe depletion  Buccal  Region  Moderate depletion  Temple Region  Severe depletion  Clavicle Bone Region  Severe depletion  Clavicle and Acromion Bone Region  Severe depletion  Scapular Bone Region  Severe depletion  Dorsal Hand  Severe depletion  Patellar Region  Severe depletion  Anterior Thigh Region  Severe depletion  Posterior Calf Region  Severe depletion  Edema (RD Assessment)  None  Hair  Reviewed  Eyes  Reviewed  Mouth  Reviewed  Skin  Reviewed  Nails  Reviewed       Diet Order:  Diet NPO time specified  EDUCATION NEEDS:   Education needs have been addressed  Skin:  Skin Assessment: Reviewed RN Assessment  Last BM:  07/18/17  Height:   Ht Readings from Last 1 Encounters:  07/19/17 5\' 3"  (1.6 m)    Weight:   Wt Readings from Last 1 Encounters:  07/19/17 84 lb 7 oz (38.3 kg)    Ideal Body Weight:  52.3 kg  BMI:  Body mass index is 14.96 kg/m.  Estimated Nutritional Needs:   Kcal:  1400-1600 kcal/day  Protein:  60-70 g/day  Fluid:  >1.4 L/day    Mariana Single RD, LDN Clinical Nutrition Pager # - [phone]

## 2017-07-19 NOTE — Plan of Care (Addendum)
Pt with SBO likely due to adhesions, h/o same 2x before (this will be 3rd time), h/o COPD so not great surgical candidate, going to med-surg at [facility].  Informed that NGT already in place.  NPO, will put on IVF.

## 2017-07-19 NOTE — ED Notes (Signed)
[facility] arrived to transport pt to [facility]

## 2017-07-19 NOTE — Consult Note (Addendum)
[facility] Consult/Admission Note  Tamara Adams 01/31/1941  [id number].    Requesting MD: Dr. Maylene Roes  Chief Complaint/Reason for Consult: SBO  HPI:   Pt is a 76 year old female with a history of COPD on 6L home O2, chronic abd pain and nausea, multiple abdominal surgeries last one was 12/2014 (Dr. Ninfa Linden) for exploratory laparotomy with lysis of adhesions and incisional hernia repair in 12/2014, multiple SBOs who presented to the ED with complaints of abdominal pain and nausea. She states this feels similar to previous small bowel obstructions. Patient states she didn't vomit until she arrived to the hospital. She is unsure of the color of her vomit.   Patient states she's had increased nausea and abdominal pain for last 2 weeks but progressively worsened yesterday afternoon to constant, severe, upper abdominal pain that did not radiate. Associated nausea. Patient states she's been having bowel movements for the last 2 weeks and had some flatus yesterday but none since. Sister at bedside. Patient states ever since her cholecystectomy in 2015 she has had chronic nausea and abdominal pain. She states roughly 2 or 3 SBO's since her surgery in 2016. They have all resolved without need for surgery.  CT scan abdomen showed multiple dilated loops of fluid-filled small bowel with suspected transition point in the central pelvis consistent with mechanical small bowel obstruction. Negative for free air.  ROS:  Review of Systems  Constitutional: Negative for chills and fever.  HENT: Negative for sore throat.   Respiratory: Negative for shortness of breath.   Cardiovascular: Negative for chest pain.  Gastrointestinal: Positive for abdominal pain, nausea and vomiting. Negative for constipation.  Neurological: Negative for dizziness and loss of consciousness.  All other systems reviewed and are negative.    Family History  Problem Relation Age of Onset  . Hypertension Mother   . Ovarian  cancer Mother   . Glaucoma Mother   . Glaucoma Sister   . Colon cancer Neg Hx   . Colon polyps Neg Hx   . Diabetes Neg Hx   . Kidney disease Neg Hx   . Gallbladder disease Neg Hx   . Esophageal cancer Neg Hx     Past Medical History:  Diagnosis Date  . Anemia    as a child  . Arthritis    "hands" (12/24/2014)  . Complication of anesthesia    " My blood gas dropped during surgery so I was left on a ventilator and in ICU for 3 days."  . COPD (chronic obstructive pulmonary disease) (HCC)    FVC 72%, FEV1 29%, FEV1 ratio 32% (very severe (COPD)  . COPD (chronic obstructive pulmonary disease) ([place])   . Degenerative disc disease   . Depression with anxiety   . Diverticulosis   . DVT (deep venous thrombosis) ([place])    "LLE; years ago after a surgery"  . Essential hypertension   . Fluttering sensation of heart    pt put on metoprolol as a result  . GERD (gastroesophageal reflux disease)    PMH  . H/O hiatal hernia   . Headache    "maybe weekly" (12/24/2014)  . Hyperlipidemia   . Hypertension   . Kidney stone   . Liver lesion   . On home oxygen therapy    "3L; sleep w/it; use it when I rest" (12/24/2014)  . Peptic ulcer   . Pinched nerve    in back  . Pneumonia   . PONV (postoperative nausea and vomiting)   . Shortness  of breath dyspnea    with exertion  . Skipped heart beats   . Small bowel obstruction ([place])    "several times; OR twice" (12/24/2014)  . Tubular adenoma of colon 09/2011  . Wears glasses     Past Surgical History:  Procedure Laterality Date  . ABDOMINAL ADHESION SURGERY  12/24/2014  . ABDOMINAL HYSTERECTOMY  1987   and one ovary  . CATARACT EXTRACTION W/ INTRAOCULAR LENS  IMPLANT, BILATERAL Right 06/22/2012   Dr. Lester Kinsman.   Marland Kitchen CATARACT EXTRACTION W/PHACO Left 06/12/2012   Dr. Boykin Reaper  . CHOLECYSTECTOMY N/A 08/18/2013   Procedure: LAPAROSCOPIC CHOLECYSTECTOMY;  Surgeon: Harl Bowie, MD;  Location: [facility];  Service: General;  Laterality: N/A;  .  DILATION AND CURETTAGE OF UTERUS    . EXPLORATORY LAPAROTOMY  12/24/2014  . HERNIA REPAIR    . INCISIONAL HERNIA REPAIR  12/24/2014  . INCISIONAL HERNIA REPAIR N/A 12/24/2014   Procedure: HERNIA REPAIR INCISIONAL;  Surgeon: Coralie Keens, MD;  Location: [facility];  Service: General;  Laterality: N/A;  . LAPAROTOMY N/A 12/24/2014   Procedure: EXPLORATORY LAPAROTOMY;  Surgeon: Coralie Keens, MD;  Location: [facility];  Service: General;  Laterality: N/A;  . LYSIS OF ADHESION N/A 12/24/2014   Procedure: LYSIS OF ADHESION;  Surgeon: Coralie Keens, MD;  Location: [facility];  Service: General;  Laterality: N/A;  . MULTIPLE TOOTH EXTRACTIONS    . OOPHORECTOMY  1992  . SMALL INTESTINE SURGERY     for a blockage, no bowel was removed, just kinked from scar tissue  . TUBAL LIGATION    . URETHRAL DILATION      Social History:  reports that she quit smoking about 1 years ago. Her smoking use included cigarettes. She has a 14.25 pack-year smoking history. she has never used smokeless tobacco. She reports that she does not drink alcohol or use drugs.  Allergies:  Allergies  Allergen Reactions  . Maxidex [Dexamethasone] Other (See Comments)    DEHYDRATION AND HEART RACING  . Advair Diskus [Fluticasone-Salmeterol] Other (See Comments)    No benefit with lungs (INEFFECTIVE)  . Remeron [Mirtazapine] Other (See Comments)    CAUSED NIGHTMARES  . Trazodone And Nefazodone Other (See Comments)    Heart pounding  . Tylox [Oxycodone-Acetaminophen] Itching    Medications Prior to Admission  Medication Sig Dispense Refill  . acetaminophen (TYLENOL) 325 MG tablet Take 325-650 mg by mouth every 6 (six) hours as needed for headache.    . albuterol (PROVENTIL) (2.5 MG/3ML) 0.083% nebulizer solution USE 1 VIAL (3 ML) VIA NEBULIZER EVERY 6 HOURS AS NEEDED FOR WHEEZING OR SHORTNESS OF BREATH 810 mL 12  . amLODipine (NORVASC) 5 MG tablet TAKE 1 TABLET DAILY 90 tablet 1  . dronabinol (MARINOL) 2.5 MG capsule Take 1 capsule  (2.5 mg total) by mouth 2 (two) times daily. 60 capsule 1  . fluticasone furoate-vilanterol (BREO ELLIPTA) 100-25 MCG/INH AEPB Inhale 1 puff into the lungs daily. 2 each 0  . guaiFENesin (MUCINEX) 600 MG 12 hr tablet Take 1 tablet (600 mg total) by mouth 2 (two) times daily. 30 tablet 0  . HYDROcodone-acetaminophen (NORCO) 10-325 MG tablet Take 1 tablet every 6 (six) hours as needed by mouth. OK to fill 06/18/17 (Patient taking differently: Take 1 tablet by mouth every 6 (six) hours as needed for moderate pain. OK to fill 06/18/17) 60 tablet 0  . lidocaine (XYLOCAINE) 2 % solution Use as directed 20 mLs in the mouth or throat 2 (two) times daily  as needed. (Patient taking differently: Use as directed 20 mLs in the mouth or throat 2 (two) times daily as needed for mouth pain. ) 900 mL 3  . losartan (COZAAR) 100 MG tablet TAKE ONE-HALF (1/2) TABLET DAILY (Patient taking differently: TAKE50 MG TABLET DAILY) 90 tablet 1  . metoprolol succinate (TOPROL-XL) 50 MG 24 hr tablet TAKE 1 TABLET DAILY 90 tablet 0  . Multiple Vitamin (MULTIVITAMIN WITH MINERALS) TABS tablet Take 1 tablet by mouth daily. Pt uses One-A-Day brand    . nystatin (MYCOSTATIN) 100000 UNIT/ML suspension Take 5 mLs (500,000 Units total) by mouth 4 (four) times daily. 240 mL 0  . omeprazole (PRILOSEC) 40 MG capsule Take 1 capsule (40 mg total) by mouth 2 (two) times daily. 60 capsule 3  . ondansetron (ZOFRAN-ODT) 8 MG disintegrating tablet PLACE 1 TABLET ON THE TONGUE EVERY 8 HOURS AS NEEDED FOR NAUSEA 90 tablet 1  . PROAIR HFA 108 (90 Base) MCG/ACT inhaler USE 2 TO 4 INHALATIONS EVERY 4 TO 6 HOURS AS NEEDED 25.5 g 1  . promethazine (PHENERGAN) 25 MG tablet Take 1 tablet (25 mg total) by mouth every 8 (eight) hours as needed for nausea or vomiting. 90 tablet 0  . ranitidine (ZANTAC) 150 MG tablet Take 1 tablet (150 mg total) by mouth daily. 90 tablet 4  . traMADol (ULTRAM) 50 MG tablet Take 1 tablet (50 mg total) by mouth every 6 (six) hours  as needed (pain). 270 tablet 1  . trimethoprim-polymyxin b (POLYTRIM) ophthalmic solution One drop in effected eye every 6 hours for 7 days for conjunctivitis. 10 mL 0  . umeclidinium bromide (INCRUSE ELLIPTA) 62.5 MCG/INH AEPB Inhale 1 puff into the lungs daily. 2 each 0  . AMBULATORY NON FORMULARY MEDICATION Medication Name: Needs oxygen concentrator set to 6 liter per minutes as she has a long tubing on her machine.  Fax to [employer] 1 vial 0  . AMBULATORY NON FORMULARY MEDICATION Medication Name: GI cocktail: 130m Lidocaine2%, 1048mMaalox, 5053monnatol. Ok to take twice daily. 250 mL 3  . AMBULATORY NON FORMULARY MEDICATION Wheel Chair 1 each 0  . AMBULATORY NON FORMULARY MEDICATION PT and OT Evaluation and Treatment. 1 each 0  . OXYGEN Inhale 4 L into the lungs continuous.      Blood pressure (!) 154/68, pulse 73, temperature 99.4 F (37.4 C), temperature source Oral, resp. rate 19, height 5' 3"  (1.6 m), weight 84 lb 7 oz (38.3 kg), SpO2 100 %.  Physical Exam  Constitutional: She is oriented to person, place, and time. Vital signs are normal. No distress.  Thin, frail, white female  HENT:  Head: Normocephalic and atraumatic.  Nose: Nose normal.  Mouth/Throat: Oropharynx is clear and moist. No oropharyngeal exudate.  Eyes: Conjunctivae are normal. Pupils are equal, round, and reactive to light. Right eye exhibits no discharge. Left eye exhibits no discharge. No scleral icterus.  Neck: Normal range of motion. Neck supple. No thyromegaly present.  Cardiovascular: Normal rate, regular rhythm, normal heart sounds and intact distal pulses. Exam reveals no gallop and no friction rub.  No murmur heard. Pulses:      Radial pulses are 2+ on the right side, and 2+ on the left side.  Pulmonary/Chest: Effort normal. No respiratory distress. She has decreased breath sounds. She has no wheezes. She has no rhonchi. She has no rales.  Decreased/distant breath sounds heard throughout, [place]ta South in place   Abdominal: Soft. She exhibits no distension and no mass. Bowel sounds are hypoactive.  There is no hepatosplenomegaly. There is generalized tenderness (Mild). There is no rigidity, no rebound and no guarding.  Musculoskeletal: Normal range of motion. She exhibits no edema or deformity.  Neurological: She is alert and oriented to person, place, and time.  Skin: Skin is warm and dry. No rash noted. She is not diaphoretic.  Psychiatric: Mood and affect normal.  Nursing note and vitals reviewed.   Results for orders placed or performed during the hospital encounter of 07/18/17 (from the past 48 hour(s))  CBC with Differential     Status: Abnormal   Collection Time: 07/18/17  8:33 PM  Result Value Ref Range   WBC 10.3 4.0 - 10.5 K/uL   RBC 4.02 3.87 - 5.11 MIL/uL   Hemoglobin 11.0 (L) 12.0 - 15.0 g/dL   HCT 34.8 (L) 36.0 - 46.0 %   MCV 86.6 78.0 - 100.0 fL   MCH 27.4 26.0 - 34.0 pg   MCHC 31.6 30.0 - 36.0 g/dL   RDW 14.2 11.5 - 15.5 %   Platelets 380 150 - 400 K/uL   Neutrophils Relative % 74 %   Neutro Abs 7.6 1.7 - 7.7 K/uL   Lymphocytes Relative 18 %   Lymphs Abs 1.8 0.7 - 4.0 K/uL   Monocytes Relative 8 %   Monocytes Absolute 0.8 0.1 - 1.0 K/uL   Eosinophils Relative 1 %   Eosinophils Absolute 0.1 0.0 - 0.7 K/uL   Basophils Relative 1 %   Basophils Absolute 0.1 0.0 - 0.1 K/uL  Comprehensive metabolic panel     Status: Abnormal   Collection Time: 07/18/17  8:40 PM  Result Value Ref Range   Sodium 135 135 - 145 mmol/L   Potassium 4.3 3.5 - 5.1 mmol/L   Chloride 93 (L) 101 - 111 mmol/L   CO2 31 22 - 32 mmol/L   Glucose, Bld 171 (H) 65 - 99 mg/dL   BUN 9 6 - 20 mg/dL   Creatinine, Ser 0.77 0.44 - 1.00 mg/dL   Calcium 9.3 8.9 - 10.3 mg/dL   Total Protein 7.3 6.5 - 8.1 g/dL   Albumin 4.1 3.5 - 5.0 g/dL   AST 28 15 - 41 U/L   ALT 15 14 - 54 U/L   Alkaline Phosphatase 74 38 - 126 U/L   Total Bilirubin 0.4 0.3 - 1.2 mg/dL   GFR calc non Af Amer >60 >60 mL/min   GFR calc Af  Amer >60 >60 mL/min    Comment: (NOTE) The eGFR has been calculated using the CKD EPI equation. This calculation has not been validated in all clinical situations. eGFR's persistently <60 mL/min signify possible Chronic Kidney Disease.    Anion gap 11 5 - 15  Basic metabolic panel     Status: Abnormal   Collection Time: 07/19/17  4:31 AM  Result Value Ref Range   Sodium 135 135 - 145 mmol/L   Potassium 4.1 3.5 - 5.1 mmol/L   Chloride 92 (L) 101 - 111 mmol/L   CO2 32 22 - 32 mmol/L   Glucose, Bld 150 (H) 65 - 99 mg/dL   BUN 12 6 - 20 mg/dL   Creatinine, Ser 0.90 0.44 - 1.00 mg/dL   Calcium 9.2 8.9 - 10.3 mg/dL   GFR calc non Af Amer >60 >60 mL/min   GFR calc Af Amer >60 >60 mL/min    Comment: (NOTE) The eGFR has been calculated using the CKD EPI equation. This calculation has not been validated in all clinical situations.  eGFR's persistently <60 mL/min signify possible Chronic Kidney Disease.    Anion gap 11 5 - 15   Dg Abd 1 View  Result Date: 07/19/2017 CLINICAL DATA:  Nasogastric tube placement. EXAM: ABDOMEN - 1 VIEW COMPARISON:  Abdominal radiograph performed earlier today at 12:10 a.m. FINDINGS: The patient's enteric tube is seen ending overlying the body of the stomach. Previously noted small bowel dilatation is less well characterized on the current study. Residual stool is seen within the colon. No free intra-abdominal air is seen, though evaluation for free air is limited on a single supine view. Clips are noted within the right upper quadrant, reflecting prior cholecystectomy. Mild degenerative change is noted along the lower lumbar spine. The visualized lung bases are grossly clear. IMPRESSION: Enteric tube noted ending overlying the body of the stomach. Electronically Signed   By: Garald Balding M.D.   On: 07/19/2017 04:36   Dg Abdomen 1 View  Result Date: 07/19/2017 CLINICAL DATA:  Nasogastric tube placement. EXAM: ABDOMEN - 1 VIEW COMPARISON:  CT of the abdomen and  pelvis performed 07/18/2017 FINDINGS: The patient's nasogastric tube is seen ending overlying the distal esophagus. If it has been fully advanced, this may be coiled within the patient's mouth. It could be advanced 10 cm. Residual contrast is noted within the renal collecting systems and bladder. A distended small bowel loop is noted at the right lower quadrant, measuring 4.0 cm in diameter. On evaluation of the prior CT, there appears to be twisting of several loops of bowel about a single point at the lower abdomen, with associated effacement of vasculature. This is suspicious for an internal hernia with associated bowel volvulus resulting in obstruction, and is unlikely to resolve on its own. A small to moderate amount of stool is noted within the colon. Mild degenerative change is noted along the lumbar spine. The visualized lung bases are grossly clear. IMPRESSION: 1. Nasogastric tube noted ending overlying the distal esophagus. If it has been fully advanced, this may be coiled within the patient's mouth. It could be advanced 10 cm. 2. On evaluation of prior CT, there appears to be twisting of several loops of bowel about a single point at the lower abdomen, with associated effacement of vasculature. This is suspicious for internal hernia with associated bowel volvulus resulting in obstruction, and is unlikely to resolve on its own. These results were called by telephone at the time of interpretation on 07/19/2017 at 12:58 am to Dr. Orvilla Cornwall, who verbally acknowledged these results. Electronically Signed   By: Garald Balding M.D.   On: 07/19/2017 00:59   Ct Abdomen Pelvis W Contrast  Result Date: 07/18/2017 CLINICAL DATA:  Abdominal pain and nausea EXAM: CT ABDOMEN AND PELVIS WITH CONTRAST TECHNIQUE: Multidetector CT imaging of the abdomen and pelvis was performed using the standard protocol following bolus administration of intravenous contrast. CONTRAST:  59m ISOVUE-300 IOPAMIDOL (ISOVUE-300)  INJECTION 61% COMPARISON:  07/04/2017, 12/02/2015, 03/06/2015, 10/04/2014 FINDINGS: Lower chest: Lung bases demonstrate emphysema. Heart size within normal limits. Hepatobiliary: Hypodense lesions within the left and right hepatic lobes, stable. Surgical clips at the gallbladder fossa. Minimal intra hepatic biliary dilatation. Pancreas: Unremarkable. No pancreatic or surrounding inflammatory changes. Prominent pancreatic duct unchanged. Spleen: Normal in size without focal abnormality. Adrenals/Urinary Tract: Adrenal glands are unremarkable. Kidneys are without hydronephrosis. subcentimeter hypodensities in the kidneys, too small to further characterize but probably cysts. Bladder is unremarkable. Stomach/Bowel: Stomach is distended. Multiple loops of dilated fluid-filled small bowel, measuring up to 3.6 cm  with suspected transition point in the mid small bowel, central pelvis, series 2, image number 53. Small bowel distal to this is decompressed. Moderate stool in the colon. Sigmoid colon diverticula without acute inflammation. Vascular/Lymphatic: Moderate to marked atherosclerosis of the aorta. No aneurysmal dilatation. No significantly enlarged lymph nodes Reproductive: Status post hysterectomy. No adnexal masses. Other: Negative for free air or free fluid. Musculoskeletal: Scoliosis of the spine with degenerative changes. No acute or suspicious abnormality. IMPRESSION: 1. Gastric distention with multiple dilated loops of fluid-filled small bowel with suspected transition point in the central pelvis, consistent with mechanical small bowel obstruction. Negative for free air. 2. Re- demonstrated hypodense liver lesions, unchanged. 3. Emphysema at the lung bases Electronically Signed   By: Donavan Foil M.D.   On: 07/18/2017 23:25      Assessment/plan Principal Problem:   SBO (small bowel obstruction) ([place]) - she's had one obstruction since her last surgery that resolved medically. Active Problems:   COPD  (chronic obstructive pulmonary disease) (HCC) - on home O2   Chronic hypoxemic respiratory failure (HCC)   Essential hypertension   Chronic abdominal pain  SBO - Small bowel protocol with Gastrografin administration and 8 hour delay film - This is likely 2/2 adhesions from her multiple abdominal surgeries  - We will follow, thank you for the consult  Kalman Drape, [facility] 07/19/2017, 2:47 PM Pager: [phone]  Agree with above. Getting small bowel protocol. Abdomen soft and non tender.  Alphonsa Overall, MD, [facility] Pager: [phone] Office phone:  [phone]

## 2017-07-19 NOTE — Progress Notes (Signed)
  PROGRESS NOTE  Patient admitted earlier this morning. See H&P. Tamara Adams is a 76 yo female with past medical history significant of COPD on 6-7L home O2, chronic abdominal pain, multiple surgeries, multiple SBOs (at least 2x).  She presents to the emergency department with complaints of increased abdominal pain, nausea, vomiting.  Her last bowel movement day prior to presentation.  CT revealed small bowel obstruction, NG tube was placed.  Complaining of nausea this morning, NG tube to intermittent suction, NPO, IVF.  Physical exam reveals soft abdomen, nontender to palpation, positive bowel sounds.  She does have diminished breath sounds and remains on nasal cannula O2. General surgery was consulted   Dessa Phi, DO [facility] [email or web address] Password [facility] 07/19/2017, 11:47 AM

## 2017-07-20 ENCOUNTER — Inpatient Hospital Stay (HOSPITAL_COMMUNITY): Payer: Medicare Other

## 2017-07-20 LAB — CBC
HEMATOCRIT: 32.8 % — AB (ref 36.0–46.0)
Hemoglobin: 10.4 g/dL — ABNORMAL LOW (ref 12.0–15.0)
MCH: 28.1 pg (ref 26.0–34.0)
MCHC: 31.7 g/dL (ref 30.0–36.0)
MCV: 88.6 fL (ref 78.0–100.0)
Platelets: 410 10*3/uL — ABNORMAL HIGH (ref 150–400)
RBC: 3.7 MIL/uL — ABNORMAL LOW (ref 3.87–5.11)
RDW: 15.1 % (ref 11.5–15.5)
WBC: 13.4 10*3/uL — AB (ref 4.0–10.5)

## 2017-07-20 LAB — BASIC METABOLIC PANEL
ANION GAP: 12 (ref 5–15)
BUN: 26 mg/dL — AB (ref 6–20)
CALCIUM: 8.8 mg/dL — AB (ref 8.9–10.3)
CO2: 35 mmol/L — AB (ref 22–32)
Chloride: 95 mmol/L — ABNORMAL LOW (ref 101–111)
Creatinine, Ser: 1.17 mg/dL — ABNORMAL HIGH (ref 0.44–1.00)
GFR calc Af Amer: 51 mL/min — ABNORMAL LOW (ref >60)
GFR calc non Af Amer: 44 mL/min — ABNORMAL LOW (ref >60)
GLUCOSE: 95 mg/dL (ref 65–99)
Potassium: 3.6 mmol/L (ref 3.5–5.1)
Sodium: 142 mmol/L (ref 135–145)

## 2017-07-20 MED ORDER — DILTIAZEM HCL 100 MG IV SOLR
5.0000 mg/h | INTRAVENOUS | Status: DC
  Administered 2017-07-20: 5 mg/h via INTRAVENOUS
  Filled 2017-07-20: qty 100

## 2017-07-20 MED ORDER — SODIUM CHLORIDE 0.9 % IV BOLUS (SEPSIS)
500.0000 mL | Freq: Once | INTRAVENOUS | Status: AC
  Administered 2017-07-20: 500 mL via INTRAVENOUS

## 2017-07-20 MED ORDER — PHENOL 1.4 % MT LIQD
2.0000 | OROMUCOSAL | Status: DC | PRN
  Administered 2017-07-20: 2 via OROMUCOSAL
  Filled 2017-07-20: qty 177

## 2017-07-20 MED ORDER — DILTIAZEM HCL 25 MG/5ML IV SOLN
5.0000 mg | Freq: Once | INTRAVENOUS | Status: AC
  Administered 2017-07-20: 5 mg via INTRAVENOUS
  Filled 2017-07-20: qty 5

## 2017-07-20 NOTE — Progress Notes (Signed)
Patient has only urinated 100cc in 9 hours. Bladder scan was done and it showed 100cc. On call was made aware and new order was given for a 500cc bolus

## 2017-07-20 NOTE — Progress Notes (Signed)
Upon assessment pt complains of pain and nausea. PRN dilaudid and phenergan given with no relief of symptoms. Pt states she just "does not feel good." Pt with increased work of breathing and irregular pulse, but no chest pain. EKG obtained and revealed pt in a-fib with RVR. HR sustaining 190s-200s. Rapid response called to bedside and on call provider Bodenheimer notified. New orders placed for 500 cc bolus and 5 mg Cardizem push. HR only decreased to 160s with these interventions. Pt was then started on Cardizem drip @ 5 ml/hr per NP orders. Will continue to monitor closely.

## 2017-07-20 NOTE — Progress Notes (Addendum)
PROGRESS NOTE    Palma Buster  JYN:[id number] DOB: 04-18-1941 DOA: 07/18/2017 PCP: [name], MD     Brief Narrative:  Tamara Adams is a 76 yo female with past medical history significant ofCOPD on 6-7L home O2, chronic abdominal pain, multiple surgeries, multiple SBOs (at least 2x).  She presents to the emergency department with complaints of increased abdominal pain, nausea, vomiting.  Her last bowel movement day prior to presentation.  CT revealed small bowel obstruction, NG tube was placed and general surgery consulted.  Assessment & Plan:   Principal Problem:   SBO (small bowel obstruction) (HCC) Active Problems:   COPD (chronic obstructive pulmonary disease) (HCC)   Chronic hypoxemic respiratory failure (HCC)   Essential hypertension   Recurrent SBO -Passing gas, no BM yet -NGT -NPO -Ambulate -General surgery following  Chronic hypoxemic respiratory failure -Requires 6-7L [place] O2 at baseline   AKI -Admission Cr 0.77 --> 1.17 -IVF increased rate   COPD -Without acute exacerbation  HTN -Continue lopressor (switched to IV due to NPO)    DVT prophylaxis: lovenox Code Status: full Family Communication: no family at bedside Disposition Plan: pending improvement   Consultants:   General Surgery  Procedures:   None   Antimicrobials:  Anti-infectives (From admission, onward)   Start     Dose/Rate Route Frequency Ordered Stop   07/19/17 0215  piperacillin-tazobactam (ZOSYN) IVPB 3.375 g  Status:  Discontinued     3.375 g 100 mL/hr over 30 Minutes Intravenous  Once 07/19/17 0204 07/19/17 0205       Subjective: Feeling well. Continues to have nausea, no abdominal pain. Passing gas this morning.   Objective: Vitals:   07/19/17 1256 07/19/17 2141 07/20/17 0456 07/20/17 0808  BP: (!) 154/68 (!) 167/69 (!) 159/70   Pulse: 73 78 96   Resp: 19 16 18    Temp: 99.4 F (37.4 C) 99.8 F (37.7 C) 98.6 F (37 C)   TempSrc: Oral Oral Oral   SpO2:  100% 100% 91% (!) 89%  Weight:      Height:        Intake/Output Summary (Last 24 hours) at 07/20/2017 1022 Last data filed at 07/20/2017 0600 Gross per 24 hour  Intake 1928.75 ml  Output 2050 ml  Net -121.25 ml   Filed Weights   07/18/17 2020 07/19/17 0251  Weight: 39.9 kg (88 lb) 38.3 kg (84 lb 7 oz)    Examination:  General exam: Appears calm and comfortable  Respiratory system: Diminished breath. Respiratory effort normal. Cardiovascular system: S1 & S2 heard, RRR. No JVD, murmurs, rubs, gallops or clicks. No pedal edema. Gastrointestinal system: Abdomen is nondistended, soft and nontender. No organomegaly or masses felt. +bowel sounds heard. +NG in place Central nervous system: Alert and oriented. No focal neurological deficits. Extremities: Symmetric 5 x 5 power. Skin: No rashes, lesions or ulcers Psychiatry: Judgement and insight appear normal. Mood & affect appropriate.   Data Reviewed: I have personally reviewed following labs and imaging studies  CBC: Recent Labs  Lab 07/18/17 2033 07/20/17 0416  WBC 10.3 13.4*  NEUTROABS 7.6  --   HGB 11.0* 10.4*  HCT 34.8* 32.8*  MCV 86.6 88.6  PLT 380 865*   Basic Metabolic Panel: Recent Labs  Lab 07/18/17 2040 07/19/17 0431 07/20/17 0416  NA 135 135 142  K 4.3 4.1 3.6  CL 93* 92* 95*  CO2 31 32 35*  GLUCOSE 171* 150* 95  BUN 9 12 26*  CREATININE 0.77 0.90 1.17*  CALCIUM 9.3 9.2 8.8*   GFR: Estimated Creatinine Clearance: 24.7 mL/min (A) (by C-G formula based on SCr of 1.17 mg/dL (H)). Liver Function Tests: Recent Labs  Lab 07/18/17 2040  AST 28  ALT 15  ALKPHOS 74  BILITOT 0.4  PROT 7.3  ALBUMIN 4.1   No results for input(s): LIPASE, AMYLASE in the last 168 hours. No results for input(s): AMMONIA in the last 168 hours. Coagulation Profile: No results for input(s): INR, PROTIME in the last 168 hours. Cardiac Enzymes: No results for input(s): CKTOTAL, CKMB, CKMBINDEX, TROPONINI in the last 168  hours. BNP (last 3 results) No results for input(s): PROBNP in the last 8760 hours. HbA1C: No results for input(s): HGBA1C in the last 72 hours. CBG: No results for input(s): GLUCAP in the last 168 hours. Lipid Profile: No results for input(s): CHOL, HDL, LDLCALC, TRIG, CHOLHDL, LDLDIRECT in the last 72 hours. Thyroid Function Tests: No results for input(s): TSH, T4TOTAL, FREET4, T3FREE, THYROIDAB in the last 72 hours. Anemia Panel: No results for input(s): VITAMINB12, FOLATE, FERRITIN, TIBC, IRON, RETICCTPCT in the last 72 hours. Sepsis Labs: No results for input(s): PROCALCITON, LATICACIDVEN in the last 168 hours.  No results found for this or any previous visit (from the past 240 hour(s)).     Radiology Studies: Dg Abd 1 View  Result Date: 07/19/2017 CLINICAL DATA:  Nasogastric tube placement. EXAM: ABDOMEN - 1 VIEW COMPARISON:  Abdominal radiograph performed earlier today at 12:10 a.m. FINDINGS: The patient's enteric tube is seen ending overlying the body of the stomach. Previously noted small bowel dilatation is less well characterized on the current study. Residual stool is seen within the colon. No free intra-abdominal air is seen, though evaluation for free air is limited on a single supine view. Clips are noted within the right upper quadrant, reflecting prior cholecystectomy. Mild degenerative change is noted along the lower lumbar spine. The visualized lung bases are grossly clear. IMPRESSION: Enteric tube noted ending overlying the body of the stomach. Electronically Signed   By: Garald Balding M.D.   On: 07/19/2017 04:36   Dg Abdomen 1 View  Result Date: 07/19/2017 CLINICAL DATA:  Nasogastric tube placement. EXAM: ABDOMEN - 1 VIEW COMPARISON:  CT of the abdomen and pelvis performed 07/18/2017 FINDINGS: The patient's nasogastric tube is seen ending overlying the distal esophagus. If it has been fully advanced, this may be coiled within the patient's mouth. It could be  advanced 10 cm. Residual contrast is noted within the renal collecting systems and bladder. A distended small bowel loop is noted at the right lower quadrant, measuring 4.0 cm in diameter. On evaluation of the prior CT, there appears to be twisting of several loops of bowel about a single point at the lower abdomen, with associated effacement of vasculature. This is suspicious for an internal hernia with associated bowel volvulus resulting in obstruction, and is unlikely to resolve on its own. A small to moderate amount of stool is noted within the colon. Mild degenerative change is noted along the lumbar spine. The visualized lung bases are grossly clear. IMPRESSION: 1. Nasogastric tube noted ending overlying the distal esophagus. If it has been fully advanced, this may be coiled within the patient's mouth. It could be advanced 10 cm. 2. On evaluation of prior CT, there appears to be twisting of several loops of bowel about a single point at the lower abdomen, with associated effacement of vasculature. This is suspicious for internal hernia with associated bowel volvulus resulting in obstruction,  and is unlikely to resolve on its own. These results were called by telephone at the time of interpretation on 07/19/2017 at 12:58 am to Dr. Orvilla Cornwall, who verbally acknowledged these results. Electronically Signed   By: Garald Balding M.D.   On: 07/19/2017 00:59   Ct Abdomen Pelvis W Contrast  Result Date: 07/18/2017 CLINICAL DATA:  Abdominal pain and nausea EXAM: CT ABDOMEN AND PELVIS WITH CONTRAST TECHNIQUE: Multidetector CT imaging of the abdomen and pelvis was performed using the standard protocol following bolus administration of intravenous contrast. CONTRAST:  22mL ISOVUE-300 IOPAMIDOL (ISOVUE-300) INJECTION 61% COMPARISON:  07/04/2017, 12/02/2015, 03/06/2015, 10/04/2014 FINDINGS: Lower chest: Lung bases demonstrate emphysema. Heart size within normal limits. Hepatobiliary: Hypodense lesions within the left  and right hepatic lobes, stable. Surgical clips at the gallbladder fossa. Minimal intra hepatic biliary dilatation. Pancreas: Unremarkable. No pancreatic or surrounding inflammatory changes. Prominent pancreatic duct unchanged. Spleen: Normal in size without focal abnormality. Adrenals/Urinary Tract: Adrenal glands are unremarkable. Kidneys are without hydronephrosis. subcentimeter hypodensities in the kidneys, too small to further characterize but probably cysts. Bladder is unremarkable. Stomach/Bowel: Stomach is distended. Multiple loops of dilated fluid-filled small bowel, measuring up to 3.6 cm with suspected transition point in the mid small bowel, central pelvis, series 2, image number 53. Small bowel distal to this is decompressed. Moderate stool in the colon. Sigmoid colon diverticula without acute inflammation. Vascular/Lymphatic: Moderate to marked atherosclerosis of the aorta. No aneurysmal dilatation. No significantly enlarged lymph nodes Reproductive: Status post hysterectomy. No adnexal masses. Other: Negative for free air or free fluid. Musculoskeletal: Scoliosis of the spine with degenerative changes. No acute or suspicious abnormality. IMPRESSION: 1. Gastric distention with multiple dilated loops of fluid-filled small bowel with suspected transition point in the central pelvis, consistent with mechanical small bowel obstruction. Negative for free air. 2. Re- demonstrated hypodense liver lesions, unchanged. 3. Emphysema at the lung bases Electronically Signed   By: Donavan Foil M.D.   On: 07/18/2017 23:25   Dg Abd Portable 1v-small Bowel Obstruction Protocol-initial, 8 Hr Delay  Result Date: 07/20/2017 CLINICAL DATA:  8 hour follow-up radiograph for small-bowel obstruction protocol. EXAM: PORTABLE ABDOMEN - 1 VIEW COMPARISON:  Abdominal radiograph performed earlier today at 2:55 p.m. FINDINGS: Contrast is seen filling the small bowel loops, with persistent dilatation of small-bowel loops to 4.2  cm in maximal diameter, compatible with persistent small bowel obstruction. There is no evidence of progression of contrast to the colon at this time. Residual contrast is seen within the bladder. No free intra-abdominal air is seen, though evaluation for free air is limited on supine views. The patient's enteric tube is seen ending overlying the body of the stomach. No acute osseous abnormalities are identified. IMPRESSION: Persistent small bowel obstruction, with diffuse dilatation of small-bowel loops and contrast filling small bowel loops. Electronically Signed   By: Garald Balding M.D.   On: 07/20/2017 02:06   Dg Abd Portable 1v-small Bowel Protocol-position Verification  Result Date: 07/19/2017 CLINICAL DATA:  NG tube placement. EXAM: PORTABLE ABDOMEN - 1 VIEW 2:55 p.m. COMPARISON:  Radiograph dated 07/19/2017 at 4:15 a.m. FINDINGS: NG tube tip is in the midbody of the stomach. Bowel gas pattern is normal. Surgical clips from previous cholecystectomy. Slight lumbar scoliosis and aortic atherosclerosis. Faint contrast remains in the right renal collecting system. IMPRESSION: 1. NG tube tip is in the midbody of the stomach. 2. Aortic atherosclerosis. 3. Otherwise benign appearing abdomen. 4. Faint contrast remains in the right renal collecting system. Does the patient  have impaired renal function? Electronically Signed   By: Lorriane Shire M.D.   On: 07/19/2017 15:22      Scheduled Meds: . enoxaparin (LOVENOX) injection  30 mg Subcutaneous Daily  . fluticasone furoate-vilanterol  1 puff Inhalation Daily  . mouth rinse  15 mL Mouth Rinse BID  . metoprolol tartrate  5 mg Intravenous Q6H  . trimethoprim-polymyxin b  1 drop Both Eyes Q6H  . umeclidinium bromide  1 puff Inhalation Daily   Continuous Infusions: . sodium chloride 125 mL/hr at 07/20/17 0824     LOS: 1 day    Time spent: 30 minutes   Dessa Phi, DO [facility] [email or web address] Password [facility] 07/20/2017, 10:22 AM

## 2017-07-20 NOTE — Progress Notes (Signed)
SBO (small bowel obstruction) (HCC)  Subjective: Pt's pain and nausea are slightly better  Objective: Vital signs in last 24 hours: Temp:  [98.6 F (37 C)-99.8 F (37.7 C)] 98.6 F (37 C) (12/29 0456) Pulse Rate:  [73-96] 96 (12/29 0456) Resp:  [16-19] 18 (12/29 0456) BP: (150-167)/(64-70) 159/70 (12/29 0456) SpO2:  [89 %-100 %] 89 % (12/29 0808) Last BM Date: 07/18/17  Intake/Output from previous day: 12/28 0701 - 12/29 0700 In: 1928.8 [I.V.:1928.8] Out: 2050 [Urine:500; Emesis/NG output:1550] Intake/Output this shift: No intake/output data recorded.  General appearance: alert and cooperative GI: soft, non-distended, nontender  Lab Results:  Results for orders placed or performed during the hospital encounter of 07/18/17 (from the past 24 hour(s))  CBC     Status: Abnormal   Collection Time: 07/20/17  4:16 AM  Result Value Ref Range   WBC 13.4 (H) 4.0 - 10.5 K/uL   RBC 3.70 (L) 3.87 - 5.11 MIL/uL   Hemoglobin 10.4 (L) 12.0 - 15.0 g/dL   HCT 32.8 (L) 36.0 - 46.0 %   MCV 88.6 78.0 - 100.0 fL   MCH 28.1 26.0 - 34.0 pg   MCHC 31.7 30.0 - 36.0 g/dL   RDW 15.1 11.5 - 15.5 %   Platelets 410 (H) 150 - 400 K/uL  Basic metabolic panel     Status: Abnormal   Collection Time: 07/20/17  4:16 AM  Result Value Ref Range   Sodium 142 135 - 145 mmol/L   Potassium 3.6 3.5 - 5.1 mmol/L   Chloride 95 (L) 101 - 111 mmol/L   CO2 35 (H) 22 - 32 mmol/L   Glucose, Bld 95 65 - 99 mg/dL   BUN 26 (H) 6 - 20 mg/dL   Creatinine, Ser 1.17 (H) 0.44 - 1.00 mg/dL   Calcium 8.8 (L) 8.9 - 10.3 mg/dL   GFR calc non Af Amer 44 (L) >60 mL/min   GFR calc Af Amer 51 (L) >60 mL/min   Anion gap 12 5 - 15     Studies/Results Radiology     MEDS, Scheduled . enoxaparin (LOVENOX) injection  30 mg Subcutaneous Daily  . fluticasone furoate-vilanterol  1 puff Inhalation Daily  . mouth rinse  15 mL Mouth Rinse BID  . metoprolol tartrate  5 mg Intravenous Q6H  . trimethoprim-polymyxin b  1 drop  Both Eyes Q6H  . umeclidinium bromide  1 puff Inhalation Daily     Assessment: SBO (small bowel obstruction) (HCC) SB protocol started  Plan: Repeat AXR this AM Pt does not want to go to surgery as she is concerned that she would not tolerate this due to her pulmonary issues.   Will follow Ambulate in hall  LOS: 1 day    Rosario Adie, MD [facility], [place]   07/20/2017 8:13 AM

## 2017-07-20 NOTE — Significant Event (Signed)
Rapid Response Event Note  Overview: Time Called: 2010 Arrival Time: 2012 Event Type: Cardiac  Initial Focused Assessment: 76 yo female lying on bed, awake but eyes closed.  Pt with SBO, nausea given phenergan for same.  RN noted the dynamap to be reading irregular on the HR.  12 lead acquired showing afib RVR, pt with history of "fast heart beat" requiring metoprolol at times.  RN obtained orders from [facility] extender to include bolus, cardizem push and cardizem drip.  Pt remained asymptomatic denying any complaints.  VSS as listed on flowsheet.   Interventions: Supportive  Plan of Care (if not transferred): currently stay in room  Event Summary:   at      at          [facility]

## 2017-07-20 NOTE — Progress Notes (Signed)
Pt converted from a-fib with RVR back to NSR. EKG confirmed finding. On call provider made aware. Will continue to monitor closely.

## 2017-07-21 ENCOUNTER — Inpatient Hospital Stay (HOSPITAL_COMMUNITY): Payer: Medicare Other

## 2017-07-21 DIAGNOSIS — K56609 Unspecified intestinal obstruction, unspecified as to partial versus complete obstruction: Secondary | ICD-10-CM

## 2017-07-21 DIAGNOSIS — I10 Essential (primary) hypertension: Secondary | ICD-10-CM

## 2017-07-21 DIAGNOSIS — I4891 Unspecified atrial fibrillation: Secondary | ICD-10-CM

## 2017-07-21 DIAGNOSIS — I214 Non-ST elevation (NSTEMI) myocardial infarction: Secondary | ICD-10-CM

## 2017-07-21 DIAGNOSIS — I480 Paroxysmal atrial fibrillation: Secondary | ICD-10-CM

## 2017-07-21 LAB — CBC
HCT: 30.3 % — ABNORMAL LOW (ref 36.0–46.0)
HEMOGLOBIN: 9.3 g/dL — AB (ref 12.0–15.0)
MCH: 27.6 pg (ref 26.0–34.0)
MCHC: 30.7 g/dL (ref 30.0–36.0)
MCV: 89.9 fL (ref 78.0–100.0)
PLATELETS: 329 10*3/uL (ref 150–400)
RBC: 3.37 MIL/uL — AB (ref 3.87–5.11)
RDW: 15 % (ref 11.5–15.5)
WBC: 13.6 10*3/uL — AB (ref 4.0–10.5)

## 2017-07-21 LAB — ECHOCARDIOGRAM COMPLETE
Height: 63 in
WEIGHTICAEL: 1350.98 [oz_av]

## 2017-07-21 LAB — BASIC METABOLIC PANEL
Anion gap: 13 (ref 5–15)
BUN: 26 mg/dL — AB (ref 6–20)
CO2: 28 mmol/L (ref 22–32)
Calcium: 7.8 mg/dL — ABNORMAL LOW (ref 8.9–10.3)
Chloride: 106 mmol/L (ref 101–111)
Creatinine, Ser: 1.01 mg/dL — ABNORMAL HIGH (ref 0.44–1.00)
GFR, EST NON AFRICAN AMERICAN: 53 mL/min — AB (ref >60)
Glucose, Bld: 92 mg/dL (ref 65–99)
POTASSIUM: 3.8 mmol/L (ref 3.5–5.1)
SODIUM: 147 mmol/L — AB (ref 135–145)

## 2017-07-21 LAB — TROPONIN I
TROPONIN I: 12.3 ng/mL — AB (ref <0.03)
Troponin I: 11.33 ng/mL (ref <0.03)

## 2017-07-21 LAB — HEPARIN LEVEL (UNFRACTIONATED): HEPARIN UNFRACTIONATED: 0.24 [IU]/mL — AB (ref 0.30–0.70)

## 2017-07-21 MED ORDER — ALBUTEROL SULFATE (2.5 MG/3ML) 0.083% IN NEBU: 2.5000 mg | INHALATION_SOLUTION | Freq: Three times a day (TID) | RESPIRATORY_TRACT | Status: DC

## 2017-07-21 MED ORDER — HEPARIN (PORCINE) IN NACL 100-0.45 UNIT/ML-% IJ SOLN
450.0000 [IU]/h | INTRAMUSCULAR | Status: DC
  Administered 2017-07-21: 450 [IU]/h via INTRAVENOUS
  Filled 2017-07-21: qty 250

## 2017-07-21 MED ORDER — ALBUTEROL SULFATE (2.5 MG/3ML) 0.083% IN NEBU
2.5000 mg | INHALATION_SOLUTION | Freq: Two times a day (BID) | RESPIRATORY_TRACT | Status: DC
  Administered 2017-07-21: 2.5 mg via RESPIRATORY_TRACT
  Filled 2017-07-21: qty 3

## 2017-07-21 MED ORDER — HEPARIN (PORCINE) IN NACL 100-0.45 UNIT/ML-% IJ SOLN
550.0000 [IU]/h | INTRAMUSCULAR | Status: DC
  Administered 2017-07-21: 550 [IU]/h via INTRAVENOUS

## 2017-07-21 MED ORDER — METOPROLOL SUCCINATE ER 25 MG PO TB24
50.0000 mg | ORAL_TABLET | Freq: Every day | ORAL | Status: DC
  Administered 2017-07-21: 50 mg via ORAL
  Filled 2017-07-21: qty 2
  Filled 2017-07-21: qty 1

## 2017-07-21 MED ORDER — HEPARIN BOLUS VIA INFUSION
2000.0000 [IU] | Freq: Once | INTRAVENOUS | Status: AC
  Administered 2017-07-21: 2000 [IU] via INTRAVENOUS
  Filled 2017-07-21: qty 2000

## 2017-07-21 MED ORDER — ALUM & MAG HYDROXIDE-SIMETH 200-200-20 MG/5ML PO SUSP
30.0000 mL | ORAL | Status: DC | PRN
  Administered 2017-07-21 – 2017-07-22 (×2): 30 mL via ORAL
  Filled 2017-07-21 (×2): qty 30

## 2017-07-21 MED ORDER — POLYETHYLENE GLYCOL 3350 17 G PO PACK
17.0000 g | PACK | Freq: Every day | ORAL | Status: DC
  Administered 2017-07-21 – 2017-07-27 (×4): 17 g via ORAL
  Filled 2017-07-21 (×5): qty 1

## 2017-07-21 NOTE — Progress Notes (Signed)
CMT notified pt had 22 beat run of v-tach. Pt asleep and asymptomatic. VSS. On call provider made aware. No new orders at this time. Will continue to monitor closely.

## 2017-07-21 NOTE — Progress Notes (Signed)
[facility] for heparin Indication: chest pain/ACS  Allergies  Allergen Reactions  . Maxidex [Dexamethasone] Other (See Comments)    DEHYDRATION AND HEART RACING  . Advair Diskus [Fluticasone-Salmeterol] Other (See Comments)    No benefit with lungs (INEFFECTIVE)  . Remeron [Mirtazapine] Other (See Comments)    CAUSED NIGHTMARES  . Trazodone And Nefazodone Other (See Comments)    Heart pounding  . Tylox [Oxycodone-Acetaminophen] Itching    Patient Measurements: Height: 5\' 3"  (160 cm) Weight: 84 lb 7 oz (38.3 kg) IBW/kg (Calculated) : 52.4 Heparin Dosing Weight: total body weight of 38.3 kg  Vital Signs: Temp: 98.5 F (36.9 C) (12/30 1557) Temp Source: Oral (12/30 1557) BP: 160/65 (12/30 1557) Pulse Rate: 103 (12/30 1557)  Labs: Recent Labs    07/18/17 2033  07/19/17 0431 07/20/17 0416 07/21/17 0340 07/21/17 0821 07/21/17 1436 07/21/17 1746  HGB 11.0*  --   --  10.4* 9.3*  --   --   --   HCT 34.8*  --   --  32.8* 30.3*  --   --   --   PLT 380  --   --  410* 329  --   --   --   HEPARINUNFRC  --   --   --   --   --   --   --  0.24*  CREATININE  --    < > 0.90 1.17* 1.01*  --   --   --   TROPONINI  --   --   --   --   --  12.30* 11.33*  --    < > = values in this interval not displayed.    Estimated Creatinine Clearance: 28.7 mL/min (A) (by C-G formula based on SCr of 1.01 mg/dL (H)).   Medical History: Past Medical History:  Diagnosis Date  . Anemia    as a child  . Arthritis    "hands" (12/24/2014)  . Complication of anesthesia    " My blood gas dropped during surgery so I was left on a ventilator and in ICU for 3 days."  . COPD (chronic obstructive pulmonary disease) (HCC)    FVC 72%, FEV1 29%, FEV1 ratio 32% (very severe (COPD)  . COPD (chronic obstructive pulmonary disease) ([place])   . Degenerative disc disease   . Depression with anxiety   . Diverticulosis   . DVT (deep venous thrombosis) ([place])    "LLE; years ago  after a surgery"  . Essential hypertension   . Fluttering sensation of heart    pt put on metoprolol as a result  . GERD (gastroesophageal reflux disease)    PMH  . H/O hiatal hernia   . Headache    "maybe weekly" (12/24/2014)  . Hyperlipidemia   . Hypertension   . Kidney stone   . Liver lesion   . On home oxygen therapy    "3L; sleep w/it; use it when I rest" (12/24/2014)  . Peptic ulcer   . Pinched nerve    in back  . Pneumonia   . PONV (postoperative nausea and vomiting)   . Shortness of breath dyspnea    with exertion  . Skipped heart beats   . Small bowel obstruction ([place])    "several times; OR twice" (12/24/2014)  . Tubular adenoma of colon 09/2011  . Wears glasses      Assessment: 54 yoF admitted with recurrent SBO , now having bowel function with conservative management.  Topronins significantly elevated today, starting heparin infusion for NSTEMI.    Last dose of Lovenox 30 mg q24h 12/29 10am. Noted patient's small body weight. Hgb low with downward trend from admission. No bleeding reported.  Platelets WNL. CrCl~28 ml/min.  2nd shift update:  First Heparin Level = 0.24 with heparin infusing @ 450 units/hr  No complications of therapy noted  Goal of Therapy:  Heparin level 0.3-0.7 units/ml Monitor platelets by anticoagulation protocol: Yes   Plan:  Increase heparin infusion to 550 units/hr. Check heparin level 8 hours after rate increase Daily CBC and heparin level while on heparin infusion.  Roark Rufo, Toribio Harbour, PharmD 07/21/2017,6:58 PM

## 2017-07-21 NOTE — Progress Notes (Addendum)
ANTICOAGULATION CONSULT NOTE - Initial Consult  Pharmacy Consult for heparin Indication: chest pain/ACS  Allergies  Allergen Reactions  . Maxidex [Dexamethasone] Other (See Comments)    DEHYDRATION AND HEART RACING  . Advair Diskus [Fluticasone-Salmeterol] Other (See Comments)    No benefit with lungs (INEFFECTIVE)  . Remeron [Mirtazapine] Other (See Comments)    CAUSED NIGHTMARES  . Trazodone And Nefazodone Other (See Comments)    Heart pounding  . Tylox [Oxycodone-Acetaminophen] Itching    Patient Measurements: Height: 5\' 3"  (160 cm) Weight: 84 lb 7 oz (38.3 kg) IBW/kg (Calculated) : 52.4 Heparin Dosing Weight: total body weight of 38.3 kg  Vital Signs: Temp: 99.6 F (37.6 C) (12/30 0358) Temp Source: Oral (12/30 0358) BP: 130/53 (12/30 0358) Pulse Rate: 92 (12/30 0358)  Labs: Recent Labs    07/18/17 2033  07/19/17 0431 07/20/17 0416 07/21/17 0340 07/21/17 0821  HGB 11.0*  --   --  10.4* 9.3*  --   HCT 34.8*  --   --  32.8* 30.3*  --   PLT 380  --   --  410* 329  --   CREATININE  --    < > 0.90 1.17* 1.01*  --   TROPONINI  --   --   --   --   --  12.30*   < > = values in this interval not displayed.    Estimated Creatinine Clearance: 28.7 mL/min (A) (by C-G formula based on SCr of 1.01 mg/dL (H)).   Medical History: Past Medical History:  Diagnosis Date  . Anemia    as a child  . Arthritis    "hands" (12/24/2014)  . Complication of anesthesia    " My blood gas dropped during surgery so I was left on a ventilator and in ICU for 3 days."  . COPD (chronic obstructive pulmonary disease) (HCC)    FVC 72%, FEV1 29%, FEV1 ratio 32% (very severe (COPD)  . COPD (chronic obstructive pulmonary disease) ([place])   . Degenerative disc disease   . Depression with anxiety   . Diverticulosis   . DVT (deep venous thrombosis) ([place])    "LLE; years ago after a surgery"  . Essential hypertension   . Fluttering sensation of heart    pt put on metoprolol as a result  .  GERD (gastroesophageal reflux disease)    PMH  . H/O hiatal hernia   . Headache    "maybe weekly" (12/24/2014)  . Hyperlipidemia   . Hypertension   . Kidney stone   . Liver lesion   . On home oxygen therapy    "3L; sleep w/it; use it when I rest" (12/24/2014)  . Peptic ulcer   . Pinched nerve    in back  . Pneumonia   . PONV (postoperative nausea and vomiting)   . Shortness of breath dyspnea    with exertion  . Skipped heart beats   . Small bowel obstruction ([place])    "several times; OR twice" (12/24/2014)  . Tubular adenoma of colon 09/2011  . Wears glasses      Assessment: 11 yoF admitted with recurrent SBO , now having bowel function with conservative management.  Topronins significantly elevated today, starting heparin infusion for NSTEMI.    Last dose of Lovenox 30 mg q24h 12/29 10am. Noted patient's small body weight. Hgb low with downward trend from admission. No bleeding reported.  Platelets WNL. CrCl~28 ml/min.  Goal of Therapy:  Heparin level 0.3-0.7 units/ml Monitor platelets by anticoagulation protocol:  Yes   Plan:  Discontinue prophylactic Lovenox. Start heparin with 2000 unit bolus then initiate infusion at 450 units/hr. Check heparin level in 8 hours. Daily CBC and heparin level while on heparin infusion.  Hershal Coria 07/21/2017,9:26 AM

## 2017-07-21 NOTE — Consult Note (Signed)
Cardiology Consultation:   Patient ID: Tamara Adams; [id number]; 1940/09/14   Admit date: 07/18/2017 Date of Consult: 07/21/2017  Primary Care Provider: [name], MD Primary Cardiologist: previous [place]   Primary Electrophysiologist:     Patient Profile:   Tamara Adams is a 76 y.o. female with a hx of severe COPD, hypertension, and palpitations who is being seen today for the evaluation of non-ST segment elevation myocardial infarction at the request of Dr. Maylene Roes.  History of Present Illness:   Tamara Adams has been seen by Dr. Stanford Breed in the past.  She has a history of hypertension and severe shortness of breath with exertion.  She was found to have severe COPD many years ago.  She just stopped smoking a year ago.  Is had a history of recurrent small bowel obstructions and presented to the hospital 2 days ago with symptoms consistent with a recurrent small bowel obstruction.  She is chronically short of breath.  She is on home oxygen 6-7 L/min.  She has not had any episodes of angina.  She walks around in her house but is not able to go outside and exercise. Her appetite and p.o. intake have been poor for the past several years because of chronic nausea presumably related to chronic partial small bowel obstructions and recurrent small bowel obstructions.     She had a sustained episode of rapid atrial fibrillation that was seen on EKG on July 20, 2017. She has had several episodes of nonsustained ventricular tachycardia and one episode of nonsustained atrial fibrillation on telemetry.  Currently is fairly comfortable.  She has baseline shortness of breath.  Past Medical History:  Diagnosis Date  . Anemia    as a child  . Arthritis    "hands" (12/24/2014)  . Complication of anesthesia    " My blood gas dropped during surgery so I was left on a ventilator and in ICU for 3 days."  . COPD (chronic obstructive pulmonary disease) (HCC)    FVC 72%, FEV1 29%, FEV1  ratio 32% (very severe (COPD)  . COPD (chronic obstructive pulmonary disease) ([place])   . Degenerative disc disease   . Depression with anxiety   . Diverticulosis   . DVT (deep venous thrombosis) ([place])    "LLE; years ago after a surgery"  . Essential hypertension   . Fluttering sensation of heart    pt put on metoprolol as a result  . GERD (gastroesophageal reflux disease)    PMH  . H/O hiatal hernia   . Headache    "maybe weekly" (12/24/2014)  . Hyperlipidemia   . Hypertension   . Kidney stone   . Liver lesion   . On home oxygen therapy    "3L; sleep w/it; use it when I rest" (12/24/2014)  . Peptic ulcer   . Pinched nerve    in back  . Pneumonia   . PONV (postoperative nausea and vomiting)   . Shortness of breath dyspnea    with exertion  . Skipped heart beats   . Small bowel obstruction ([place])    "several times; OR twice" (12/24/2014)  . Tubular adenoma of colon 09/2011  . Wears glasses     Past Surgical History:  Procedure Laterality Date  . ABDOMINAL ADHESION SURGERY  12/24/2014  . ABDOMINAL HYSTERECTOMY  1987   and one ovary  . CATARACT EXTRACTION W/ INTRAOCULAR LENS  IMPLANT, BILATERAL Right 06/22/2012   Dr. Lester Kinsman.   Marland Kitchen CATARACT EXTRACTION W/PHACO Left 06/12/2012   Dr.  Boykin Reaper  . CHOLECYSTECTOMY N/A 08/18/2013   Procedure: LAPAROSCOPIC CHOLECYSTECTOMY;  Surgeon: Harl Bowie, MD;  Location: [facility];  Service: General;  Laterality: N/A;  . DILATION AND CURETTAGE OF UTERUS    . EXPLORATORY LAPAROTOMY  12/24/2014  . HERNIA REPAIR    . INCISIONAL HERNIA REPAIR  12/24/2014  . INCISIONAL HERNIA REPAIR N/A 12/24/2014   Procedure: HERNIA REPAIR INCISIONAL;  Surgeon: Coralie Keens, MD;  Location: [facility];  Service: General;  Laterality: N/A;  . LAPAROTOMY N/A 12/24/2014   Procedure: EXPLORATORY LAPAROTOMY;  Surgeon: Coralie Keens, MD;  Location: [facility];  Service: General;  Laterality: N/A;  . LYSIS OF ADHESION N/A 12/24/2014   Procedure: LYSIS OF ADHESION;  Surgeon:  Coralie Keens, MD;  Location: [facility];  Service: General;  Laterality: N/A;  . MULTIPLE TOOTH EXTRACTIONS    . OOPHORECTOMY  1992  . SMALL INTESTINE SURGERY     for a blockage, no bowel was removed, just kinked from scar tissue  . TUBAL LIGATION    . URETHRAL DILATION       Home Medications:  Prior to Admission medications   Medication Sig Start Date End Date Taking? Authorizing Provider  acetaminophen (TYLENOL) 325 MG tablet Take 325-650 mg by mouth every 6 (six) hours as needed for headache.   Yes [provider]  albuterol (PROVENTIL) (2.5 MG/3ML) 0.083% nebulizer solution USE 1 VIAL (3 ML) VIA NEBULIZER EVERY 6 HOURS AS NEEDED FOR WHEEZING OR SHORTNESS OF BREATH 02/11/17  Yes [name], MD  amLODipine (NORVASC) 5 MG tablet TAKE 1 TABLET DAILY 11/02/16  Yes [name], MD  dronabinol (MARINOL) 2.5 MG capsule Take 1 capsule (2.5 mg total) by mouth 2 (two) times daily. 07/17/17  Yes Breeback, Jade L, PA-C  fluticasone furoate-vilanterol (BREO ELLIPTA) 100-25 MCG/INH AEPB Inhale 1 puff into the lungs daily. 06/28/16  Yes Parrett, Tammy S, NP  guaiFENesin (MUCINEX) 600 MG 12 hr tablet Take 1 tablet (600 mg total) by mouth 2 (two) times daily. 04/03/16  Yes Barton Dubois, MD  HYDROcodone-acetaminophen (NORCO) 10-325 MG tablet Take 1 tablet every 6 (six) hours as needed by mouth. OK to fill 06/18/17 Patient taking differently: Take 1 tablet by mouth every 6 (six) hours as needed for moderate pain. OK to fill 06/18/17 06/18/17  Yes [name], MD  lidocaine (XYLOCAINE) 2 % solution Use as directed 20 mLs in the mouth or throat 2 (two) times daily as needed. Patient taking differently: Use as directed 20 mLs in the mouth or throat 2 (two) times daily as needed for mouth pain.  04/17/17  Yes [name], MD  losartan (COZAAR) 100 MG tablet TAKE ONE-HALF (1/2) TABLET DAILY Patient taking differently: TAKE50 MG TABLET DAILY 07/02/17  Yes [name], MD  metoprolol succinate (TOPROL-XL) 50 MG 24 hr tablet TAKE 1 TABLET DAILY 06/17/17  Yes [name], MD  Multiple Vitamin (MULTIVITAMIN WITH MINERALS) TABS tablet Take 1 tablet by mouth daily. Pt uses One-A-Day brand   Yes [provider]  nystatin (MYCOSTATIN) 100000 UNIT/ML suspension Take 5 mLs (500,000 Units total) by mouth 4 (four) times daily. 03/19/17  Yes [name], MD  omeprazole (PRILOSEC) 40 MG capsule Take 1 capsule (40 mg total) by mouth 2 (two) times daily. 04/03/16  Yes Barton Dubois, MD  ondansetron (ZOFRAN-ODT) 8 MG disintegrating tablet PLACE 1 TABLET ON THE TONGUE EVERY 8 HOURS AS NEEDED FOR NAUSEA 05/17/17  Yes [name], MD  PROAIR HFA 108 (90 Base) MCG/ACT inhaler USE 2 TO 4 INHALATIONS EVERY 4 TO 6 HOURS AS NEEDED 02/11/17  Yes [name], MD  promethazine (PHENERGAN) 25 MG tablet Take 1 tablet (25 mg total) by mouth every 8 (eight) hours as needed for nausea or vomiting. 01/03/17  Yes [name], MD  ranitidine (ZANTAC) 150 MG tablet Take 1 tablet (150 mg total) by mouth daily. 01/03/17  Yes [name], MD  traMADol (ULTRAM) 50 MG tablet Take 1 tablet (50 mg total) by mouth every 6 (six) hours as needed (pain). 01/14/17  Yes [name], MD  trimethoprim-polymyxin b (POLYTRIM) ophthalmic solution One drop in effected eye every 6 hours for 7 days for conjunctivitis. 07/17/17  Yes Breeback, Jade L, PA-C  umeclidinium bromide (INCRUSE ELLIPTA) 62.5 MCG/INH AEPB Inhale 1 puff into the lungs daily. 06/28/16  Yes Parrett, Tammy S, NP  AMBULATORY NON FORMULARY MEDICATION Medication Name: Needs oxygen concentrator set to 6 liter per minutes as she has a long tubing on her machine.  Fax to [name] 08/27/16   [name], MD  AMBULATORY NON FORMULARY MEDICATION Medication Name: GI cocktail: 123mL Lidocaine2%, 123ml Maalox, 57ml Donnatol. Ok to take twice daily. 03/28/17   [name], MD  AMBULATORY NON FORMULARY MEDICATION Wheel Chair 07/10/17   [name], MD  AMBULATORY NON FORMULARY MEDICATION PT and OT Evaluation and Treatment. 07/10/17   [name], MD  OXYGEN Inhale 4 L into the lungs continuous.    [provider]    Inpatient Medications: Scheduled Meds: . albuterol  2.5 mg Nebulization BID  . fluticasone furoate-vilanterol  1 puff Inhalation Daily  . mouth rinse  15 mL Mouth Rinse BID  . polyethylene glycol  17 g Oral Daily  . trimethoprim-polymyxin b  1 drop Both Eyes Q6H  . umeclidinium bromide  1 puff Inhalation Daily   Continuous Infusions: . sodium chloride 125 mL/hr at 07/21/17 0451  . diltiazem (CARDIZEM) infusion 5 mg/hr (07/20/17 2228)   PRN Meds: acetaminophen **OR** acetaminophen, albuterol, hydrALAZINE, HYDROmorphone (DILAUDID) injection, lidocaine, [DISCONTINUED] ondansetron **OR** ondansetron (ZOFRAN) IV, phenol, promethazine  Allergies:    Allergies  Allergen Reactions  . Maxidex [Dexamethasone] Other (See Comments)    DEHYDRATION AND HEART RACING  . Advair Diskus [Fluticasone-Salmeterol] Other (See Comments)    No benefit with lungs (INEFFECTIVE)  . Remeron [Mirtazapine] Other (See Comments)    CAUSED NIGHTMARES  . Trazodone And Nefazodone Other (See Comments)    Heart pounding  . Tylox [Oxycodone-Acetaminophen] Itching    Social History:   Social History   Socioeconomic History  . Marital status: Married    Spouse name: Not on file  . Number of children: 3  . Years of education: Not on file  . Highest education level: Not on file  Social Needs  . Financial resource strain: Not on file  . Food insecurity - worry: Not on file  . Food insecurity - inability: Not on file  . Transportation needs - medical: Not on file  . Transportation needs - non-medical: Not on file  Occupational History  . Occupation: retired.    Tobacco Use  . Smoking status: Former Smoker    Packs/day: 0.25     Years: 57.00    Pack years: 14.25    Types: Cigarettes    Last attempt to quit: 07/25/2015    Years since quitting: 1.9  . Smokeless tobacco: Never Used  . Tobacco comment: 5 cigarettes  a day  Substance and Sexual Activity  . Alcohol use: No    Alcohol/week: 0.0 oz  . Drug use: No  . Sexual activity: No  Other Topics Concern  . Not on file  Social History Narrative   No regular exercise.     Family History:    Family History  Problem Relation Age of Onset  . Hypertension Mother   . Ovarian cancer Mother   . Glaucoma Mother   . Glaucoma Sister   . Colon cancer Neg Hx   . Colon polyps Neg Hx   . Diabetes Neg Hx   . Kidney disease Neg Hx   . Gallbladder disease Neg Hx   . Esophageal cancer Neg Hx      ROS:  Please see the history of present illness.  ROS  All other ROS reviewed and negative.     Physical Exam/Data:   Vitals:   07/20/17 2311 07/21/17 0358 07/21/17 0826 07/21/17 0844  BP: (!) 123/43 (!) 130/53    Pulse: 78 92    Resp: 20 20    Temp:  99.6 F (37.6 C)    TempSrc:  Oral    SpO2: 91% 93% (!) 85% 90%  Weight:      Height:        Intake/Output Summary (Last 24 hours) at 07/21/2017 0930 Last data filed at 07/21/2017 0600 Gross per 24 hour  Intake 2907.67 ml  Output 2145 ml  Net 762.67 ml   Filed Weights   07/18/17 2020 07/19/17 0251  Weight: 88 lb (39.9 kg) 84 lb 7 oz (38.3 kg)   Body mass index is 14.96 kg/m.  General:     HEENT: normal Lymph: no adenopathy Neck: no JVD Endocrine:  No thryomegaly Vascular: No carotid bruits; FA pulses 2+ bilaterally without bruits  Cardiac: Chest wall is very thin.  Normal S1-S2.  Regular rate. Lungs:  clear to auscultation bilaterally, no wheezing, rhonchi or rales  Abd: Abdomen is very thin.  Some BS  Ext: no edema. Extremely thin Musculoskeletal:  No deformities, BUE and BLE strength normal and equal Skin: warm and dry  Neuro:  CNs 2-12 intact, no focal abnormalities noted Psych:  Normal affect    EKG:  The EKG was personally reviewed and demonstrates:  NSR at 87 beats a minute with diffuse ST depression in the inferior and lateral leads  Telemetry:  Telemetry was personally reviewed and demonstrates:   Normal sinus rhythm.  She has episodes of nonsustained ventricular tachycardia and one episode of nonsustained atrial fibrillation that lasted 20-30 beats.  Relevant CV Studies:   Laboratory Data:  Chemistry Recent Labs  Lab 07/19/17 0431 07/20/17 0416 07/21/17 0340  NA 135 142 147*  K 4.1 3.6 3.8  CL 92* 95* 106  CO2 32 35* 28  GLUCOSE 150* 95 92  BUN 12 26* 26*  CREATININE 0.90 1.17* 1.01*  CALCIUM 9.2 8.8* 7.8*  GFRNONAA >60 44* 53*  GFRAA >60 51* >60  ANIONGAP 11 12 13     Recent Labs  Lab 07/18/17 2040  PROT 7.3  ALBUMIN 4.1  AST 28  ALT 15  ALKPHOS 74  BILITOT 0.4   Hematology Recent Labs  Lab 07/18/17 2033 07/20/17 0416 07/21/17 0340  WBC 10.3 13.4* 13.6*  RBC 4.02 3.70* 3.37*  HGB 11.0* 10.4* 9.3*  HCT 34.8* 32.8* 30.3*  MCV 86.6 88.6 89.9  MCH 27.4 28.1 27.6  MCHC 31.6 31.7 30.7  RDW 14.2 15.1 15.0  PLT 380 410*  329   Cardiac Enzymes Recent Labs  Lab 07/21/17 0821  TROPONINI 12.30*   No results for input(s): TROPIPOC in the last 168 hours.  BNPNo results for input(s): BNP, PROBNP in the last 168 hours.  DDimer No results for input(s): DDIMER in the last 168 hours.  Radiology/Studies:  Dg Abd 1 View  Result Date: 07/19/2017 CLINICAL DATA:  Nasogastric tube placement. EXAM: ABDOMEN - 1 VIEW COMPARISON:  Abdominal radiograph performed earlier today at 12:10 a.m. FINDINGS: The patient's enteric tube is seen ending overlying the body of the stomach. Previously noted small bowel dilatation is less well characterized on the current study. Residual stool is seen within the colon. No free intra-abdominal air is seen, though evaluation for free air is limited on a single supine view. Clips are noted within the right upper quadrant, reflecting  prior cholecystectomy. Mild degenerative change is noted along the lower lumbar spine. The visualized lung bases are grossly clear. IMPRESSION: Enteric tube noted ending overlying the body of the stomach. Electronically Signed   By: Garald Balding M.D.   On: 07/19/2017 04:36   Dg Abdomen 1 View  Result Date: 07/19/2017 CLINICAL DATA:  Nasogastric tube placement. EXAM: ABDOMEN - 1 VIEW COMPARISON:  CT of the abdomen and pelvis performed 07/18/2017 FINDINGS: The patient's nasogastric tube is seen ending overlying the distal esophagus. If it has been fully advanced, this may be coiled within the patient's mouth. It could be advanced 10 cm. Residual contrast is noted within the renal collecting systems and bladder. A distended small bowel loop is noted at the right lower quadrant, measuring 4.0 cm in diameter. On evaluation of the prior CT, there appears to be twisting of several loops of bowel about a single point at the lower abdomen, with associated effacement of vasculature. This is suspicious for an internal hernia with associated bowel volvulus resulting in obstruction, and is unlikely to resolve on its own. A small to moderate amount of stool is noted within the colon. Mild degenerative change is noted along the lumbar spine. The visualized lung bases are grossly clear. IMPRESSION: 1. Nasogastric tube noted ending overlying the distal esophagus. If it has been fully advanced, this may be coiled within the patient's mouth. It could be advanced 10 cm. 2. On evaluation of prior CT, there appears to be twisting of several loops of bowel about a single point at the lower abdomen, with associated effacement of vasculature. This is suspicious for internal hernia with associated bowel volvulus resulting in obstruction, and is unlikely to resolve on its own. These results were called by telephone at the time of interpretation on 07/19/2017 at 12:58 am to Dr. Orvilla Cornwall, who verbally acknowledged these results.  Electronically Signed   By: Garald Balding M.D.   On: 07/19/2017 00:59   Ct Abdomen Pelvis W Contrast  Result Date: 07/18/2017 CLINICAL DATA:  Abdominal pain and nausea EXAM: CT ABDOMEN AND PELVIS WITH CONTRAST TECHNIQUE: Multidetector CT imaging of the abdomen and pelvis was performed using the standard protocol following bolus administration of intravenous contrast. CONTRAST:  40mL ISOVUE-300 IOPAMIDOL (ISOVUE-300) INJECTION 61% COMPARISON:  07/04/2017, 12/02/2015, 03/06/2015, 10/04/2014 FINDINGS: Lower chest: Lung bases demonstrate emphysema. Heart size within normal limits. Hepatobiliary: Hypodense lesions within the left and right hepatic lobes, stable. Surgical clips at the gallbladder fossa. Minimal intra hepatic biliary dilatation. Pancreas: Unremarkable. No pancreatic or surrounding inflammatory changes. Prominent pancreatic duct unchanged. Spleen: Normal in size without focal abnormality. Adrenals/Urinary Tract: Adrenal glands are unremarkable. Kidneys are without  hydronephrosis. subcentimeter hypodensities in the kidneys, too small to further characterize but probably cysts. Bladder is unremarkable. Stomach/Bowel: Stomach is distended. Multiple loops of dilated fluid-filled small bowel, measuring up to 3.6 cm with suspected transition point in the mid small bowel, central pelvis, series 2, image number 53. Small bowel distal to this is decompressed. Moderate stool in the colon. Sigmoid colon diverticula without acute inflammation. Vascular/Lymphatic: Moderate to marked atherosclerosis of the aorta. No aneurysmal dilatation. No significantly enlarged lymph nodes Reproductive: Status post hysterectomy. No adnexal masses. Other: Negative for free air or free fluid. Musculoskeletal: Scoliosis of the spine with degenerative changes. No acute or suspicious abnormality. IMPRESSION: 1. Gastric distention with multiple dilated loops of fluid-filled small bowel with suspected transition point in the central  pelvis, consistent with mechanical small bowel obstruction. Negative for free air. 2. Re- demonstrated hypodense liver lesions, unchanged. 3. Emphysema at the lung bases Electronically Signed   By: Donavan Foil M.D.   On: 07/18/2017 23:25   Dg Abd Portable 1v  Result Date: 07/21/2017 CLINICAL DATA:  Follow up small bowel obstruction EXAM: PORTABLE ABDOMEN - 1 VIEW COMPARISON:  07/20/2017 FINDINGS: Nasogastric catheter is again noted with the tip in the stomach. Previously seen contrast material throughout the small bowel is significantly diluted but appears to have passed into the distal small bowel and proximal colon. The degree of small bowel dilatation has improved somewhat in the interval from the prior exam. No free air is seen. Stable degenerative changes of lumbar spine are noted. IMPRESSION: Contrast material has passed into the colon although some persists in the distal small bowel. Some mild persistent small bowel dilatation is noted although improved when compared with the prior exam. Electronically Signed   By: Inez Catalina M.D.   On: 07/21/2017 07:18   Dg Abd Portable 1v  Result Date: 07/20/2017 CLINICAL DATA:  Small bowel obstruction. EXAM: PORTABLE ABDOMEN - 1 VIEW COMPARISON:  Plain film 07/19/2017.  CT of 07/18/2017. FINDINGS: Nasogastric terminates at the body of the stomach. There is persistent but mildly improved gaseous distension of small bowel loops. Contrast has partially passed. Bladder contrast is incidentally noted. No free intraperitoneal air or other acute complication. Cholecystectomy. IMPRESSION: Improved appearance of small bowel obstruction. Electronically Signed   By: Abigail Miyamoto M.D.   On: 07/20/2017 10:47   Dg Abd Portable 1v-small Bowel Obstruction Protocol-initial, 8 Hr Delay  Result Date: 07/20/2017 CLINICAL DATA:  8 hour follow-up radiograph for small-bowel obstruction protocol. EXAM: PORTABLE ABDOMEN - 1 VIEW COMPARISON:  Abdominal radiograph performed  earlier today at 2:55 p.m. FINDINGS: Contrast is seen filling the small bowel loops, with persistent dilatation of small-bowel loops to 4.2 cm in maximal diameter, compatible with persistent small bowel obstruction. There is no evidence of progression of contrast to the colon at this time. Residual contrast is seen within the bladder. No free intra-abdominal air is seen, though evaluation for free air is limited on supine views. The patient's enteric tube is seen ending overlying the body of the stomach. No acute osseous abnormalities are identified. IMPRESSION: Persistent small bowel obstruction, with diffuse dilatation of small-bowel loops and contrast filling small bowel loops. Electronically Signed   By: Garald Balding M.D.   On: 07/20/2017 02:06   Dg Abd Portable 1v-small Bowel Protocol-position Verification  Result Date: 07/19/2017 CLINICAL DATA:  NG tube placement. EXAM: PORTABLE ABDOMEN - 1 VIEW 2:55 p.m. COMPARISON:  Radiograph dated 07/19/2017 at 4:15 a.m. FINDINGS: NG tube tip is in the midbody of  the stomach. Bowel gas pattern is normal. Surgical clips from previous cholecystectomy. Slight lumbar scoliosis and aortic atherosclerosis. Faint contrast remains in the right renal collecting system. IMPRESSION: 1. NG tube tip is in the midbody of the stomach. 2. Aortic atherosclerosis. 3. Otherwise benign appearing abdomen. 4. Faint contrast remains in the right renal collecting system. Does the patient have impaired renal function? Electronically Signed   By: Lorriane Shire M.D.   On: 07/19/2017 15:22    Assessment and Plan:   1. 1.  Coronary artery disease: Patient has evidence of non-ST segment elevation myocardial infarction.  She originally presented with a small bowel obstruction and had an episode of sustained atrial fibrillation.  She converted back to sinus rhythm on her own but follow-up troponin level was found to be 12.3.  She is not having any angina pains at this time.  She has a long  history of cigarette smoking-greater than 50 pack years.  She certainly has coronary artery disease.  She has persistent ST segment depression on her EKG even after converting from atrial fibrillation to sinus rhythm.  My biggest concern is that she is a very poor candidate for an interventional or invasive procedures.  While she would most likely be able to go through a heart catheterization she has very little reserve and I do not think that she would survive  major cardiac surgery.  We discussed conservative management and she is in favor of this.  She has not wanted surgery for her small bowel obstruction.  I agree with getting an echocardiogram for further assessment of her left ventricular function.  We will continue with medical therapy for coronary artery disease.  2.  Atrial fibrillation: CHADS2Vasc score of at least   4 ( age 42, CAD, HTN, ) She had a sustained episode of atrial fibrillation but did convert on her own.  It is quite likely that this is due to stress from her small bowel obstruction.  She has had a history of palpitations in the past.  It is not clear whether or not she actually needs anticoagulation for this atrial fibrillation since it seems to be related to her acute illness of small bowel obstruction.  She does have recurrent small bowel obstruction so this may be a recurrent problem for her.  At most I would consider starting her on Eliquis 2.5 mg twice a day ( given her frail condition, I would treat with low dose even though she would otherwise receive the full dose of Eliquis 5 mg BID )   It may be reasonable to wait and see if she has additional episodes of atrial fibrillation that do not seem to be related to an acute small bowel obstruction since she is so frail and is at higher risk for bleeding .    For questions or updates, please contact [facility] Please consult [email or web address] for contact info under Cardiology/STEMI.   Signed, Mertie Moores, MD    07/21/2017 9:30 AM

## 2017-07-21 NOTE — Progress Notes (Addendum)
PROGRESS NOTE    Tamara Adams  GGY:[id number] DOB: 1940-08-17 DOA: 07/18/2017 PCP: [name], MD     Brief Narrative:  Tamara Adams is a 75 yo female with past medical history significant ofCOPD on 6-7L home O2, chronic abdominal pain, multiple surgeries, multiple SBOs (at least 2x).  She presents to the emergency department with complaints of increased abdominal pain, nausea, vomiting.  Her last bowel movement day prior to presentation.  CT revealed small bowel obstruction, NG tube was placed and general surgery consulted.  SBO has been slowly improving with supportive care.  Overnight on 12/29, patient developed A fib RVR. She was started on cardizem gtt with conversion to normal sinus rhythm. EKG showed ST depression in inferior-lateral leads. Troponin was elevated 12.30. Cardiology was consulted.   Assessment & Plan:   Principal Problem:   SBO (small bowel obstruction) (HCC) Active Problems:   COPD (chronic obstructive pulmonary disease) (HCC)   Chronic hypoxemic respiratory failure (HCC)   Essential hypertension   Recurrent SBO -Passing gas, small BM yesterday. Slowly improving with supportive care.  -General surgery following -NGT to be removed today   NSTEMI -No chest pain or pressure this morning, complains of chest tightness overall which is her chronic tightness related to COPD  -Troponin 12.3 with EKG showing ST depression in inferior/lateral leads -Heparin gtt  -Echo ordered  -Cardiology consult  A Fib RVR -No previous history of A Fib per patient -Started on IV cardizem with conversion to NSR overnight -CHADSVASc 4 -Discussed anticoagulation with patient. Benefit of stroke risk reduction vs risk of bleeding discussed. She tells me that her husband had a stroke and ended up in nursing home. Patient would like to start anticoagulant to decrease stroke risk at this point. -Heparin gtt. Will eventually need DOAC once SBO resolves and able to take PO    Chronic hypoxemic respiratory failure due to COPD  -Requires 6-7L [place] O2 at baseline   AKI -Admission Cr 0.77 --> 1.17 --> 1.01 -Stable   COPD -Without acute exacerbation  HTN -Continue cardizem (stopped lopressor due to starting cardizem for A Fib RVR)    DVT prophylaxis: heparin gtt  Code Status: full Family Communication: no family at bedside Disposition Plan: pending improvement   Consultants:   General Surgery  Cardiology   Procedures:   None   Antimicrobials:  Anti-infectives (From admission, onward)   Start     Dose/Rate Route Frequency Ordered Stop   07/19/17 0215  piperacillin-tazobactam (ZOSYN) IVPB 3.375 g  Status:  Discontinued     3.375 g 100 mL/hr over 30 Minutes Intravenous  Once 07/19/17 0204 07/19/17 0205       Subjective: Complains of some abdominal pain and nausea this morning. Had small BM yesterday, is passing gas. No chest pain or pressure, although admits to overall chest tightness which is chronic for her COPD.   Objective: Vitals:   07/20/17 2311 07/21/17 0358 07/21/17 0826 07/21/17 0844  BP: (!) 123/43 (!) 130/53    Pulse: 78 92    Resp: 20 20    Temp:  99.6 F (37.6 C)    TempSrc:  Oral    SpO2: 91% 93% (!) 85% 90%  Weight:      Height:        Intake/Output Summary (Last 24 hours) at 07/21/2017 0930 Last data filed at 07/21/2017 0600 Gross per 24 hour  Intake 2907.67 ml  Output 2145 ml  Net 762.67 ml   Filed Weights   07/18/17 2020  07/19/17 0251  Weight: 39.9 kg (88 lb) 38.3 kg (84 lb 7 oz)    Examination:  General exam: Appears calm and comfortable  Respiratory system: Diminished breath. Respiratory effort normal. On [place] O2 6L  Cardiovascular system: S1 & S2 heard, RRR. No JVD, murmurs, rubs, gallops or clicks. No pedal edema. Gastrointestinal system: Abdomen is nondistended, soft and nontender. No organomegaly or masses felt. +bowel sounds heard. +NG in place Central nervous system: Alert and oriented. No focal  neurological deficits. Extremities: Symmetric 5 x 5 power. Skin: No rashes, lesions or ulcers Psychiatry: Judgement and insight appear normal. Mood & affect appropriate.   Data Reviewed: I have personally reviewed following labs and imaging studies  CBC: Recent Labs  Lab 07/18/17 2033 07/20/17 0416 07/21/17 0340  WBC 10.3 13.4* 13.6*  NEUTROABS 7.6  --   --   HGB 11.0* 10.4* 9.3*  HCT 34.8* 32.8* 30.3*  MCV 86.6 88.6 89.9  PLT 380 410* 960   Basic Metabolic Panel: Recent Labs  Lab 07/18/17 2040 07/19/17 0431 07/20/17 0416 07/21/17 0340  NA 135 135 142 147*  K 4.3 4.1 3.6 3.8  CL 93* 92* 95* 106  CO2 31 32 35* 28  GLUCOSE 171* 150* 95 92  BUN 9 12 26* 26*  CREATININE 0.77 0.90 1.17* 1.01*  CALCIUM 9.3 9.2 8.8* 7.8*   GFR: Estimated Creatinine Clearance: 28.7 mL/min (A) (by C-G formula based on SCr of 1.01 mg/dL (H)). Liver Function Tests: Recent Labs  Lab 07/18/17 2040  AST 28  ALT 15  ALKPHOS 74  BILITOT 0.4  PROT 7.3  ALBUMIN 4.1   No results for input(s): LIPASE, AMYLASE in the last 168 hours. No results for input(s): AMMONIA in the last 168 hours. Coagulation Profile: No results for input(s): INR, PROTIME in the last 168 hours. Cardiac Enzymes: Recent Labs  Lab 07/21/17 0821  TROPONINI 12.30*   BNP (last 3 results) No results for input(s): PROBNP in the last 8760 hours. HbA1C: No results for input(s): HGBA1C in the last 72 hours. CBG: No results for input(s): GLUCAP in the last 168 hours. Lipid Profile: No results for input(s): CHOL, HDL, LDLCALC, TRIG, CHOLHDL, LDLDIRECT in the last 72 hours. Thyroid Function Tests: No results for input(s): TSH, T4TOTAL, FREET4, T3FREE, THYROIDAB in the last 72 hours. Anemia Panel: No results for input(s): VITAMINB12, FOLATE, FERRITIN, TIBC, IRON, RETICCTPCT in the last 72 hours. Sepsis Labs: No results for input(s): PROCALCITON, LATICACIDVEN in the last 168 hours.  No results found for this or any  previous visit (from the past 240 hour(s)).     Radiology Studies: Dg Abd Portable 1v  Result Date: 07/21/2017 CLINICAL DATA:  Follow up small bowel obstruction EXAM: PORTABLE ABDOMEN - 1 VIEW COMPARISON:  07/20/2017 FINDINGS: Nasogastric catheter is again noted with the tip in the stomach. Previously seen contrast material throughout the small bowel is significantly diluted but appears to have passed into the distal small bowel and proximal colon. The degree of small bowel dilatation has improved somewhat in the interval from the prior exam. No free air is seen. Stable degenerative changes of lumbar spine are noted. IMPRESSION: Contrast material has passed into the colon although some persists in the distal small bowel. Some mild persistent small bowel dilatation is noted although improved when compared with the prior exam. Electronically Signed   By: Inez Catalina M.D.   On: 07/21/2017 07:18   Dg Abd Portable 1v  Result Date: 07/20/2017 CLINICAL DATA:  Small bowel obstruction.  EXAM: PORTABLE ABDOMEN - 1 VIEW COMPARISON:  Plain film 07/19/2017.  CT of 07/18/2017. FINDINGS: Nasogastric terminates at the body of the stomach. There is persistent but mildly improved gaseous distension of small bowel loops. Contrast has partially passed. Bladder contrast is incidentally noted. No free intraperitoneal air or other acute complication. Cholecystectomy. IMPRESSION: Improved appearance of small bowel obstruction. Electronically Signed   By: Abigail Miyamoto M.D.   On: 07/20/2017 10:47   Dg Abd Portable 1v-small Bowel Obstruction Protocol-initial, 8 Hr Delay  Result Date: 07/20/2017 CLINICAL DATA:  8 hour follow-up radiograph for small-bowel obstruction protocol. EXAM: PORTABLE ABDOMEN - 1 VIEW COMPARISON:  Abdominal radiograph performed earlier today at 2:55 p.m. FINDINGS: Contrast is seen filling the small bowel loops, with persistent dilatation of small-bowel loops to 4.2 cm in maximal diameter, compatible  with persistent small bowel obstruction. There is no evidence of progression of contrast to the colon at this time. Residual contrast is seen within the bladder. No free intra-abdominal air is seen, though evaluation for free air is limited on supine views. The patient's enteric tube is seen ending overlying the body of the stomach. No acute osseous abnormalities are identified. IMPRESSION: Persistent small bowel obstruction, with diffuse dilatation of small-bowel loops and contrast filling small bowel loops. Electronically Signed   By: Garald Balding M.D.   On: 07/20/2017 02:06   Dg Abd Portable 1v-small Bowel Protocol-position Verification  Result Date: 07/19/2017 CLINICAL DATA:  NG tube placement. EXAM: PORTABLE ABDOMEN - 1 VIEW 2:55 p.m. COMPARISON:  Radiograph dated 07/19/2017 at 4:15 a.m. FINDINGS: NG tube tip is in the midbody of the stomach. Bowel gas pattern is normal. Surgical clips from previous cholecystectomy. Slight lumbar scoliosis and aortic atherosclerosis. Faint contrast remains in the right renal collecting system. IMPRESSION: 1. NG tube tip is in the midbody of the stomach. 2. Aortic atherosclerosis. 3. Otherwise benign appearing abdomen. 4. Faint contrast remains in the right renal collecting system. Does the patient have impaired renal function? Electronically Signed   By: Lorriane Shire M.D.   On: 07/19/2017 15:22      Scheduled Meds: . albuterol  2.5 mg Nebulization BID  . fluticasone furoate-vilanterol  1 puff Inhalation Daily  . mouth rinse  15 mL Mouth Rinse BID  . polyethylene glycol  17 g Oral Daily  . trimethoprim-polymyxin b  1 drop Both Eyes Q6H  . umeclidinium bromide  1 puff Inhalation Daily   Continuous Infusions: . sodium chloride 125 mL/hr at 07/21/17 0451  . diltiazem (CARDIZEM) infusion 5 mg/hr (07/20/17 2228)     LOS: 2 days    Time spent: 40 minutes   Dessa Phi, DO [facility] [email or web address] Password [email or web address] 07/21/2017, 9:30 AM

## 2017-07-21 NOTE — Progress Notes (Signed)
  Echocardiogram 2D Echocardiogram has been performed.  Merrie Roof F 07/21/2017, 12:54 PM

## 2017-07-21 NOTE — Progress Notes (Signed)
CMT notified pt had 7 beat run of v-tach. Pt asleep and asymptomatic. On call provider made aware. Will continue to monitor closely.

## 2017-07-21 NOTE — Progress Notes (Signed)
SBO (small bowel obstruction) (HCC)  Subjective: Pt's pain and nausea are slightly better  Objective: Vital signs in last 24 hours: Temp:  [98.6 F (37 C)-99.6 F (37.6 C)] 99.6 F (37.6 C) (12/30 0358) Pulse Rate:  [78-194] 92 (12/30 0358) Resp:  [20-26] 20 (12/30 0358) BP: (91-185)/(43-104) 130/53 (12/30 0358) SpO2:  [88 %-96 %] 93 % (12/30 0358) Last BM Date: 07/20/17  Intake/Output from previous day: 12/29 0701 - 12/30 0700 In: 2907.7 [I.V.:2907.7] Out: 2145 [Urine:670; Emesis/NG [id number]] Intake/Output this shift: No intake/output data recorded.  General appearance: alert and cooperative GI: soft, non-distended, nontender  Lab Results:  Results for orders placed or performed during the hospital encounter of 07/18/17 (from the past 24 hour(s))  CBC     Status: Abnormal   Collection Time: 07/21/17  3:40 AM  Result Value Ref Range   WBC 13.6 (H) 4.0 - 10.5 K/uL   RBC 3.37 (L) 3.87 - 5.11 MIL/uL   Hemoglobin 9.3 (L) 12.0 - 15.0 g/dL   HCT 30.3 (L) 36.0 - 46.0 %   MCV 89.9 78.0 - 100.0 fL   MCH 27.6 26.0 - 34.0 pg   MCHC 30.7 30.0 - 36.0 g/dL   RDW 15.0 11.5 - 15.5 %   Platelets 329 150 - 400 K/uL  Basic metabolic panel     Status: Abnormal   Collection Time: 07/21/17  3:40 AM  Result Value Ref Range   Sodium 147 (H) 135 - 145 mmol/L   Potassium 3.8 3.5 - 5.1 mmol/L   Chloride 106 101 - 111 mmol/L   CO2 28 22 - 32 mmol/L   Glucose, Bld 92 65 - 99 mg/dL   BUN 26 (H) 6 - 20 mg/dL   Creatinine, Ser 1.01 (H) 0.44 - 1.00 mg/dL   Calcium 7.8 (L) 8.9 - 10.3 mg/dL   GFR calc non Af Amer 53 (L) >60 mL/min   GFR calc Af Amer >60 >60 mL/min   Anion gap 13 5 - 15     Studies/Results Radiology     MEDS, Scheduled . enoxaparin (LOVENOX) injection  30 mg Subcutaneous Daily  . fluticasone furoate-vilanterol  1 puff Inhalation Daily  . mouth rinse  15 mL Mouth Rinse BID  . polyethylene glycol  17 g Oral Daily  . trimethoprim-polymyxin b  1 drop Both Eyes Q6H  .  umeclidinium bromide  1 puff Inhalation Daily     Assessment: SBO (small bowel obstruction) (HCC) SB protocol started and showed contrast in colon this am  Plan: Having bowel function.   Large stool burden on AXR Will d/c ng and start clears as well as miralax Ambulate in hall  LOS: 2 days    Rosario Adie, MD [facility], [place]   07/21/2017 7:48 AM

## 2017-07-22 ENCOUNTER — Inpatient Hospital Stay (HOSPITAL_COMMUNITY): Payer: Medicare Other

## 2017-07-22 DIAGNOSIS — Z4659 Encounter for fitting and adjustment of other gastrointestinal appliance and device: Secondary | ICD-10-CM

## 2017-07-22 DIAGNOSIS — D649 Anemia, unspecified: Secondary | ICD-10-CM

## 2017-07-22 DIAGNOSIS — I480 Paroxysmal atrial fibrillation: Secondary | ICD-10-CM

## 2017-07-22 DIAGNOSIS — I4891 Unspecified atrial fibrillation: Secondary | ICD-10-CM

## 2017-07-22 DIAGNOSIS — I481 Persistent atrial fibrillation: Secondary | ICD-10-CM

## 2017-07-22 LAB — MRSA PCR SCREENING: MRSA BY PCR: NEGATIVE

## 2017-07-22 LAB — BASIC METABOLIC PANEL
Anion gap: 8 (ref 5–15)
BUN: 21 mg/dL — ABNORMAL HIGH (ref 6–20)
CALCIUM: 8.1 mg/dL — AB (ref 8.9–10.3)
CO2: 33 mmol/L — AB (ref 22–32)
CREATININE: 1.03 mg/dL — AB (ref 0.44–1.00)
Chloride: 100 mmol/L — ABNORMAL LOW (ref 101–111)
GFR calc non Af Amer: 51 mL/min — ABNORMAL LOW (ref >60)
GFR, EST AFRICAN AMERICAN: 60 mL/min — AB (ref >60)
GLUCOSE: 177 mg/dL — AB (ref 65–99)
Potassium: 3.1 mmol/L — ABNORMAL LOW (ref 3.5–5.1)
Sodium: 141 mmol/L (ref 135–145)

## 2017-07-22 LAB — HEPARIN LEVEL (UNFRACTIONATED)
Heparin Unfractionated: 0.2 IU/mL — ABNORMAL LOW (ref 0.30–0.70)
Heparin Unfractionated: 0.28 IU/mL — ABNORMAL LOW (ref 0.30–0.70)
Heparin Unfractionated: 0.26 IU/mL — ABNORMAL LOW (ref 0.30–0.70)

## 2017-07-22 LAB — CBC
HEMATOCRIT: 30.4 % — AB (ref 36.0–46.0)
Hemoglobin: 9.6 g/dL — ABNORMAL LOW (ref 12.0–15.0)
MCH: 27.7 pg (ref 26.0–34.0)
MCHC: 31.6 g/dL (ref 30.0–36.0)
MCV: 87.6 fL (ref 78.0–100.0)
PLATELETS: 303 10*3/uL (ref 150–400)
RBC: 3.47 MIL/uL — ABNORMAL LOW (ref 3.87–5.11)
RDW: 15.3 % (ref 11.5–15.5)
WBC: 11 10*3/uL — ABNORMAL HIGH (ref 4.0–10.5)

## 2017-07-22 LAB — TROPONIN I: Troponin I: 6.31 ng/mL (ref <0.03)

## 2017-07-22 MED ORDER — METOPROLOL TARTRATE 5 MG/5ML IV SOLN
INTRAVENOUS | Status: AC
Start: 2017-07-22 — End: 2017-07-22
  Filled 2017-07-22: qty 5

## 2017-07-22 MED ORDER — POTASSIUM CHLORIDE 10 MEQ/100ML IV SOLN
10.0000 meq | INTRAVENOUS | Status: AC
  Administered 2017-07-22 (×4): 10 meq via INTRAVENOUS
  Filled 2017-07-22 (×3): qty 100

## 2017-07-22 MED ORDER — HEPARIN (PORCINE) IN NACL 100-0.45 UNIT/ML-% IJ SOLN: 850.0000 [IU]/h | INTRAMUSCULAR | Status: DC

## 2017-07-22 MED ORDER — DILTIAZEM LOAD VIA INFUSION
10.0000 mg | Freq: Once | INTRAVENOUS | Status: AC
  Administered 2017-07-22: 10 mg via INTRAVENOUS
  Filled 2017-07-22: qty 10

## 2017-07-22 MED ORDER — DILTIAZEM HCL 100 MG IV SOLR
10.0000 mg/h | INTRAVENOUS | Status: DC
  Filled 2017-07-22 (×2): qty 100

## 2017-07-22 MED ORDER — DILTIAZEM HCL ER COATED BEADS 180 MG PO CP24: 180.0000 mg | ORAL_CAPSULE | Freq: Every day | ORAL | Status: DC

## 2017-07-22 MED ORDER — LEVALBUTEROL HCL 0.63 MG/3ML IN NEBU: 0.6300 mg | INHALATION_SOLUTION | Freq: Four times a day (QID) | RESPIRATORY_TRACT | Status: DC | PRN

## 2017-07-22 MED ORDER — HEPARIN (PORCINE) IN NACL 100-0.45 UNIT/ML-% IJ SOLN
750.0000 [IU]/h | INTRAMUSCULAR | Status: DC
  Administered 2017-07-22: 750 [IU]/h via INTRAVENOUS
  Filled 2017-07-22: qty 250

## 2017-07-22 MED ORDER — SODIUM CHLORIDE 0.9 % IV BOLUS (SEPSIS)
500.0000 mL | Freq: Once | INTRAVENOUS | Status: AC
  Administered 2017-07-22: 500 mL via INTRAVENOUS

## 2017-07-22 MED ORDER — METOPROLOL TARTRATE 5 MG/5ML IV SOLN
5.0000 mg | Freq: Once | INTRAVENOUS | Status: AC
  Administered 2017-07-22: 5 mg via INTRAVENOUS
  Filled 2017-07-22: qty 5

## 2017-07-22 MED ORDER — FAMOTIDINE IN NACL 20-0.9 MG/50ML-% IV SOLN
20.0000 mg | Freq: Every day | INTRAVENOUS | Status: DC
  Administered 2017-07-22 – 2017-07-23 (×2): 20 mg via INTRAVENOUS
  Filled 2017-07-22 (×2): qty 50

## 2017-07-22 MED ORDER — DEXTROSE 5 % IV SOLN
5.0000 mg/h | INTRAVENOUS | Status: DC
  Administered 2017-07-22: 10 mg/h via INTRAVENOUS
  Filled 2017-07-22: qty 100

## 2017-07-22 MED ORDER — POTASSIUM CHLORIDE 10 MEQ/100ML IV SOLN
10.0000 meq | INTRAVENOUS | Status: AC
  Administered 2017-07-22 (×4): 10 meq via INTRAVENOUS
  Filled 2017-07-22 (×4): qty 100

## 2017-07-22 MED ORDER — HEPARIN (PORCINE) IN NACL 100-0.45 UNIT/ML-% IJ SOLN
700.0000 [IU]/h | INTRAMUSCULAR | Status: DC
  Administered 2017-07-22: 700 [IU]/h via INTRAVENOUS

## 2017-07-22 MED ORDER — DIGOXIN 0.25 MG/ML IJ SOLN
0.2500 mg | Freq: Once | INTRAMUSCULAR | Status: DC
  Filled 2017-07-22: qty 1

## 2017-07-22 MED ORDER — METOPROLOL TARTRATE 5 MG/5ML IV SOLN
5.0000 mg | Freq: Once | INTRAVENOUS | Status: AC
  Administered 2017-07-22: 5 mg via INTRAVENOUS

## 2017-07-22 MED ORDER — FUROSEMIDE 10 MG/ML IJ SOLN
20.0000 mg | Freq: Once | INTRAMUSCULAR | Status: AC
  Administered 2017-07-22: 20 mg via INTRAVENOUS
  Filled 2017-07-22: qty 2

## 2017-07-22 MED ORDER — DILTIAZEM HCL-DEXTROSE 100-5 MG/100ML-% IV SOLN (PREMIX)
5.0000 mg/h | INTRAVENOUS | Status: DC
  Administered 2017-07-23: 2.5 mg/h via INTRAVENOUS
  Filled 2017-07-22 (×2): qty 100

## 2017-07-22 MED ORDER — DILTIAZEM HCL 25 MG/5ML IV SOLN: 10.0000 mg | Freq: Once | INTRAVENOUS | Status: DC

## 2017-07-22 MED ORDER — METOPROLOL TARTRATE 5 MG/5ML IV SOLN
INTRAVENOUS | Status: AC
  Filled 2017-07-22: qty 5

## 2017-07-22 MED ORDER — LEVALBUTEROL HCL 0.63 MG/3ML IN NEBU
0.6300 mg | INHALATION_SOLUTION | Freq: Three times a day (TID) | RESPIRATORY_TRACT | Status: DC
  Administered 2017-07-22 – 2017-07-27 (×16): 0.63 mg via RESPIRATORY_TRACT
  Filled 2017-07-22 (×16): qty 3

## 2017-07-22 NOTE — Progress Notes (Signed)
Pharmacy - IV heparin  Assessment:    Please see note from Leodis Sias, PharmD earlier today for full details.  Briefly, 76 y.o. female on IV heparin for NSTEMI. Patient is frail and elderly with high bleed risk   Most recent heparin level remains subtherapeutic and now slightly lower at 0.26 on 750 units/hr despite several rate increases.  No line or bleeding issues per RN  Plan:   Increase heparin IV to 850 units/hr  Recheck level in 8 hrs  No bolus and conservative dosing d/t bleeding considerations above  Reuel Boom, PharmD, BCPS Pager: [phone] 07/22/2017, 10:34 PM

## 2017-07-22 NOTE — Progress Notes (Signed)
[facility] PROGRESS NOTE  Tamara Adams CNO:[id number] DOB: 1940-10-04 DOA: 07/18/2017  PCP: [name], MD  Brief History/Interval Summary: 76 year old Caucasian female with a past medical history of COPD on home oxygen, chronic abdominal pain, history of multiple surgeries, multiple small bowel obstruction presented with increasing abdominal pain nausea and vomiting.  She was found to have small bowel obstruction.  She was hospitalized.  The hospital stay has been complicated by onset of atrial fibrillation with RVR.  Cardiology was consulted.  Patient had to be transferred to the stepdown unit for closer monitoring.    Reason for Visit: Atrial fibrillation with RVR.  Small bowel obstruction.  Consultants: General surgery.  Cardiology.  Procedures:  Transthoracic echocardiogram Study Conclusions  - Left ventricle: The cavity size was normal. Wall thickness was   normal. Systolic function was normal. The estimated ejection   fraction was in the range of 60% to 65%. Wall motion was normal;   there were no regional wall motion abnormalities. Doppler   parameters are consistent with abnormal left ventricular   relaxation (grade 1 diastolic dysfunction). The E/e&' ratio is   between 8-15, suggesting indeterminate LV fililng pressure. - Mitral valve: Calcified annulus. Mildly thickened leaflets .   There was trivial regurgitation. - Left atrium: The atrium was mildly dilated. - Atrial septum: There was increased thickness of the septum,   consistent with lipomatous hypertrophy. - Inferior vena cava: The vessel was normal in size. The   respirophasic diameter changes were in the normal range (>= 50%),   consistent with normal central venous pressure.  Impressions:  - Compared to a prior study in 2017, the LVEF is higher at 60-65%.   There is now mild LAE.  Antibiotics: None  Subjective/Interval History: Patient states that she is feeling better.  She had to  be transferred to stepdown unit overnight due to recurrence of nausea vomiting and elevated heart rate.  She denies any chest pain currently.  Does admit to some shortness of breath.  Cough with clear expectoration.  Abdominal pain is 4-5 out of 10 in intensity.  No vomiting since NG tube was placed back.    ROS: Denies any headaches.  Objective:  Vital Signs  Vitals:   07/22/17 0800 07/22/17 0850 07/22/17 0900 07/22/17 1000  BP: (!) 139/47  (!) 119/43 (!) 103/32  Pulse: 93 92 98 90  Resp: 19 17 (!) 27 20  Temp: 99.8 F (37.7 C)     TempSrc: Oral     SpO2: 100% 98% 99% 98%  Weight:      Height:        Intake/Output Summary (Last 24 hours) at 07/22/2017 1235 Last data filed at 07/22/2017 0600 Gross per 24 hour  Intake 1470.02 ml  Output 1500 ml  Net -29.98 ml   Filed Weights   07/18/17 2020 07/19/17 0251  Weight: 39.9 kg (88 lb) 38.3 kg (84 lb 7 oz)    General appearance: alert, cooperative, appears stated age and no distress Head: Normocephalic, without obvious abnormality, atraumatic Resp: Crackles noted bilateral bases.  No wheezing.  Mildly tachypneic.  No use of accessory muscles. Cardio: S1-S2 is noted to be normal regular this morning.  No S3-S4.  Systolic murmur appreciated over the precordium. GI: Abdomen noted to be mildly distended.  Tender diffusely without any rebound rigidity or guarding.  No masses organomegaly.  Bowel sounds sluggish. Extremities: extremities normal, atraumatic, no cyanosis or edema Pulses: 2+ and symmetric Neurologic: No focal neurological deficits  appreciated.   Lab Results:  Data Reviewed: I have personally reviewed following labs and imaging studies  CBC: Recent Labs  Lab 07/18/17 2033 07/20/17 0416 07/21/17 0340 07/22/17 0313  WBC 10.3 13.4* 13.6* 11.0*  NEUTROABS 7.6  --   --   --   HGB 11.0* 10.4* 9.3* 9.6*  HCT 34.8* 32.8* 30.3* 30.4*  MCV 86.6 88.6 89.9 87.6  PLT 380 410* 329 824    Basic Metabolic Panel: Recent  Labs  Lab 07/18/17 2040 07/19/17 0431 07/20/17 0416 07/21/17 0340 07/22/17 0313  NA 135 135 142 147* 141  K 4.3 4.1 3.6 3.8 3.1*  CL 93* 92* 95* 106 100*  CO2 31 32 35* 28 33*  GLUCOSE 171* 150* 95 92 177*  BUN 9 12 26* 26* 21*  CREATININE 0.77 0.90 1.17* 1.01* 1.03*  CALCIUM 9.3 9.2 8.8* 7.8* 8.1*    GFR: Estimated Creatinine Clearance: 28.1 mL/min (A) (by C-G formula based on SCr of 1.03 mg/dL (H)).  Liver Function Tests: Recent Labs  Lab 07/18/17 2040  AST 28  ALT 15  ALKPHOS 74  BILITOT 0.4  PROT 7.3  ALBUMIN 4.1    Cardiac Enzymes: Recent Labs  Lab 07/21/17 0821 07/21/17 1436 07/22/17 0313  TROPONINI 12.30* 11.33* 6.31*     Recent Results (from the past 240 hour(s))  MRSA PCR Screening     Status: None   Collection Time: 07/22/17  6:34 AM  Result Value Ref Range Status   MRSA by PCR NEGATIVE NEGATIVE Final    Comment:        The GeneXpert MRSA Assay (FDA approved for NASAL specimens only), is one component of a comprehensive MRSA colonization surveillance program. It is not intended to diagnose MRSA infection nor to guide or monitor treatment for MRSA infections.       Radiology Studies: Dg Chest 1 View  Result Date: 07/22/2017 CLINICAL DATA:  Acute onset of shortness of breath. EXAM: CHEST 1 VIEW COMPARISON:  Chest radiograph performed 04/30/2016, and CT of the chest performed 07/04/2017 FINDINGS: The known left apical nodule is better characterized on prior CT. Bibasilar airspace opacities may reflect pulmonary edema or pneumonia, new from prior studies. Small bilateral pleural effusions are suspected. No pneumothorax is seen. The cardiomediastinal silhouette is normal in size. No acute osseous abnormalities are identified. The patient's enteric tube is seen extending below the diaphragm. IMPRESSION: 1. New bibasilar airspace opacities may reflect pulmonary edema or pneumonia. Small bilateral pleural effusions suspected. 2. Known left apical  nodule is better characterized on prior CT. Electronically Signed   By: Garald Balding M.D.   On: 07/22/2017 06:20   Dg Abd 1 View  Result Date: 07/22/2017 CLINICAL DATA:  Nasogastric tube placement. EXAM: ABDOMEN - 1 VIEW COMPARISON:  Abdominal radiograph performed 07/21/2017 FINDINGS: The patient's enteric tube is seen ending overlying the gastroesophageal junction. This could be advanced 6 cm. The stomach is relatively decompressed. There is worsening distention of small-bowel loops to 4.9 cm in maximal diameter. Residual contrast is seen within the ascending colon. Clips are noted within the right upper quadrant, reflecting prior cholecystectomy. No free intra-abdominal air is seen, though evaluation for free air is limited on a single supine view. Retrocardiac airspace opacity may reflect pulmonary edema or pneumonia. No acute osseous abnormalities are seen. IMPRESSION: 1. Enteric tube noted ending overlying the gastroesophageal junction. This could be advanced 6 cm. The stomach is relatively decompressed. 2. Worsening distention of small-bowel loops to 4.9 cm in maximal diameter,  concerning for interval worsening of small bowel obstruction. No free intra-abdominal air seen. 3. Retrocardiac airspace opacity may reflect pulmonary edema or pneumonia. Electronically Signed   By: Garald Balding M.D.   On: 07/22/2017 06:18   Dg Abd Portable 1v  Result Date: 07/21/2017 CLINICAL DATA:  Follow up small bowel obstruction EXAM: PORTABLE ABDOMEN - 1 VIEW COMPARISON:  07/20/2017 FINDINGS: Nasogastric catheter is again noted with the tip in the stomach. Previously seen contrast material throughout the small bowel is significantly diluted but appears to have passed into the distal small bowel and proximal colon. The degree of small bowel dilatation has improved somewhat in the interval from the prior exam. No free air is seen. Stable degenerative changes of lumbar spine are noted. IMPRESSION: Contrast material has  passed into the colon although some persists in the distal small bowel. Some mild persistent small bowel dilatation is noted although improved when compared with the prior exam. Electronically Signed   By: Inez Catalina M.D.   On: 07/21/2017 07:18     Medications:  Scheduled: . digoxin  0.25 mg Intravenous Once  . fluticasone furoate-vilanterol  1 puff Inhalation Daily  . levalbuterol  0.63 mg Nebulization TID  . mouth rinse  15 mL Mouth Rinse BID  . metoprolol succinate  50 mg Oral Daily  . metoprolol tartrate      . metoprolol tartrate      . polyethylene glycol  17 g Oral Daily  . trimethoprim-polymyxin b  1 drop Both Eyes Q6H  . umeclidinium bromide  1 puff Inhalation Daily   Continuous: . sodium chloride 10 mL/hr at 07/22/17 1018  . diltiazem (CARDIZEM) infusion 5 mg/hr (07/22/17 1045)  . heparin 700 Units/hr (07/22/17 0401)  . potassium chloride    . potassium chloride 10 mEq (07/22/17 1025)   UKG:[id number] **OR** acetaminophen, alum & mag hydroxide-simeth, hydrALAZINE, HYDROmorphone (DILAUDID) injection, levalbuterol, lidocaine, [DISCONTINUED] ondansetron **OR** ondansetron (ZOFRAN) IV, phenol, promethazine  Assessment/Plan:  Principal Problem:   SBO (small bowel obstruction) (HCC) Active Problems:   COPD (chronic obstructive pulmonary disease) (HCC)   Chronic hypoxemic respiratory failure (HCC)   Essential hypertension   Persistent atrial fibrillation ([place])   Encounter for nasogastric (NG) tube placement    Recurrent small bowel obstruction General surgery is following.  Looks like NG tube was removed on 12/30. However over the course of the night patient developed nausea vomiting.  NG tube had to be placed back early this morning.  General surgery continues to follow.  Management deferred to them.  Atrial fibrillation with RVR The patient had another episode of rapid ventricular response overnight.  She was placed back on intravenous Cardizem.  Cardiology  continues to follow.  Patient also noted to have elevated troponin levels as discussed below.  Echocardiogram as above.  Shows normal systolic function.  Grade 1 diastolic dysfunction.  No wall motion abnormalities noted.  TSH level not checked recently.  We will order one.  Most likely her atrial fibrillation is due to her acute illness.  Await further cardiology input.  They do recommend a low-dose anticoagulation when ready to take orally.  Currently on IV heparin.  Elevated troponin/non-ST elevation MI/mild pulmonary edema Patient with significantly elevated troponin level.  Cardiology is following.  Patient not thought to be a good candidate for interventional or invasive procedures.  Management deferred to cardiology.  Chest x-ray raised concern for bilateral infiltrates.  It is quite possible that she may have developed a degree of pulmonary edema due  to her tachyarrhythmia or could have aspirated as she did have episodes of vomiting.  We will give her a low dose of Lasix and monitor clinically.  Chronic hypoxemic respiratory failure secondary to COPD Uses oxygen at home at baseline.    Acute kidney injury/Hypokalemia Renal function appears to be stable.  Potassium will be repleted.  History of COPD Without acute exacerbation.  Stable.  Continue to monitor.  History of essential hypertension Monitor blood pressures closely.  She is on multiple blood pressure lowering agents.  Normocytic anemia Probably anemia of chronic disease.  No evidence for overt bleeding.  Continue to monitor.  DVT Prophylaxis: She is on a heparin infusion    Code Status: Full code Family Communication: Discussed with the patient.  No family at bedside Disposition Plan: Management as outlined above.    LOS: 3 days   [facility] Pager [phone] 07/22/2017, 12:35 PM  If 7PM-7AM, please contact night-coverage at [email or web address], password [facility]

## 2017-07-22 NOTE — Progress Notes (Signed)
Pt continued to complain of nausea and pain. Dilaudid given at 0452. Before leaving the room the pt stated that she, "Just didn't feel right again". At that time central tele called to report that pt HR was in the 220s. Pt had converted to afib with rvr. Pt began to spit up green emesis. NP Bodenheimer paged. NG tube placement and 5mg  of lopressor ordered and administered. No change in HR. Another 5mg  of lopressor ordered and given. Rapid response called. No change in HR. NP Bodenheimer paged again. Cardizem gtt ordered. Pt HT began to decrease down to the 150s after administration of IV Cardizem. Chest xray obtained for NG placement. 1528ml output from newly placed NG. Pt states that she feels, "a lot better". NP Bodenheimer placed order to transfer pt to stepdown. Transferred pt to stepdown room. Report given to RN. Called to notify pt sons that she had been moved, left message with Lennette Bihari.

## 2017-07-22 NOTE — Progress Notes (Signed)
Progress Note  Patient Name: Tamara Adams Date of Encounter: 07/22/2017  Primary Cardiologist:   Previously Stanford Breed,    Subjective   Tamara Adams is a 76 year old female with a history of severe COPD, hypertension, recurrent small bowel obstructions.  She was increasingly admitted with another small bowel obstruction.  She had an episode of rapid atrial for ablation and had a non-ST segment elevation myocardial infarction as a complication of the above.  Echocardiogram reveals normal left ventricular systolic function.  She is overall feeling better.  She still having lots of abdominal pain.  She tried some small ice chips but has not eaten anything in several days.  Inpatient Medications    Scheduled Meds: . digoxin  0.25 mg Intravenous Once  . fluticasone furoate-vilanterol  1 puff Inhalation Daily  . levalbuterol  0.63 mg Nebulization TID  . mouth rinse  15 mL Mouth Rinse BID  . metoprolol succinate  50 mg Oral Daily  . metoprolol tartrate      . metoprolol tartrate      . polyethylene glycol  17 g Oral Daily  . trimethoprim-polymyxin b  1 drop Both Eyes Q6H  . umeclidinium bromide  1 puff Inhalation Daily   Continuous Infusions: . sodium chloride 10 mL/hr at 07/22/17 1018  . diltiazem (CARDIZEM) infusion 5 mg/hr (07/22/17 1045)  . heparin    . potassium chloride    . potassium chloride Stopped (07/22/17 1300)   PRN Meds: acetaminophen **OR** acetaminophen, alum & mag hydroxide-simeth, hydrALAZINE, HYDROmorphone (DILAUDID) injection, levalbuterol, lidocaine, [DISCONTINUED] ondansetron **OR** ondansetron (ZOFRAN) IV, phenol, promethazine   Vital Signs    Vitals:   07/22/17 0850 07/22/17 0900 07/22/17 1000 07/22/17 1200  BP:  (!) 119/43 (!) 103/32   Pulse: 92 98 90   Resp: 17 (!) 27 20   Temp:    (!) 100.7 F (38.2 C)  TempSrc:    Axillary  SpO2: 98% 99% 98%   Weight:      Height:        Intake/Output Summary (Last 24 hours) at 07/22/2017 1318 Last data filed  at 07/22/2017 0600 Gross per 24 hour  Intake 1470.02 ml  Output 1500 ml  Net -29.98 ml   Filed Weights   07/18/17 2020 07/19/17 0251  Weight: 88 lb (39.9 kg) 84 lb 7 oz (38.3 kg)    Telemetry    snius tach at 100  - Personally Reviewed  ECG     NSR  - Personally Reviewed  Physical Exam   GEN: Elderly, frail female, no acute distress.  She has an NG tube in place. Neck: No JVD Cardiac:  Regular rate.  She is mildly tachycardic Respiratory: Clear to auscultation bilaterally. GI:  She has a few bowel sounds. MS: No edema; No deformity. Neuro:  Nonfocal  Psych: Normal affect   Labs    Chemistry Recent Labs  Lab 07/18/17 2040  07/20/17 0416 07/21/17 0340 07/22/17 0313  NA 135   < > 142 147* 141  K 4.3   < > 3.6 3.8 3.1*  CL 93*   < > 95* 106 100*  CO2 31   < > 35* 28 33*  GLUCOSE 171*   < > 95 92 177*  BUN 9   < > 26* 26* 21*  CREATININE 0.77   < > 1.17* 1.01* 1.03*  CALCIUM 9.3   < > 8.8* 7.8* 8.1*  PROT 7.3  --   --   --   --  ALBUMIN 4.1  --   --   --   --   AST 28  --   --   --   --   ALT 15  --   --   --   --   ALKPHOS 74  --   --   --   --   BILITOT 0.4  --   --   --   --   GFRNONAA >60   < > 44* 53* 51*  GFRAA >60   < > 51* >60 60*  ANIONGAP 11   < > 12 13 8    < > = values in this interval not displayed.     Hematology Recent Labs  Lab 07/20/17 0416 07/21/17 0340 07/22/17 0313  WBC 13.4* 13.6* 11.0*  RBC 3.70* 3.37* 3.47*  HGB 10.4* 9.3* 9.6*  HCT 32.8* 30.3* 30.4*  MCV 88.6 89.9 87.6  MCH 28.1 27.6 27.7  MCHC 31.7 30.7 31.6  RDW 15.1 15.0 15.3  PLT 410* 329 303    Cardiac Enzymes Recent Labs  Lab 07/21/17 0821 07/21/17 1436 07/22/17 0313  TROPONINI 12.30* 11.33* 6.31*   No results for input(s): TROPIPOC in the last 168 hours.   BNPNo results for input(s): BNP, PROBNP in the last 168 hours.   DDimer No results for input(s): DDIMER in the last 168 hours.   Radiology    Dg Chest 1 View  Result Date: 07/22/2017 CLINICAL  DATA:  Acute onset of shortness of breath. EXAM: CHEST 1 VIEW COMPARISON:  Chest radiograph performed 04/30/2016, and CT of the chest performed 07/04/2017 FINDINGS: The known left apical nodule is better characterized on prior CT. Bibasilar airspace opacities may reflect pulmonary edema or pneumonia, new from prior studies. Small bilateral pleural effusions are suspected. No pneumothorax is seen. The cardiomediastinal silhouette is normal in size. No acute osseous abnormalities are identified. The patient's enteric tube is seen extending below the diaphragm. IMPRESSION: 1. New bibasilar airspace opacities may reflect pulmonary edema or pneumonia. Small bilateral pleural effusions suspected. 2. Known left apical nodule is better characterized on prior CT. Electronically Signed   By: Garald Balding M.D.   On: 07/22/2017 06:20   Dg Abd 1 View  Result Date: 07/22/2017 CLINICAL DATA:  Nasogastric tube placement. EXAM: ABDOMEN - 1 VIEW COMPARISON:  Abdominal radiograph performed 07/21/2017 FINDINGS: The patient's enteric tube is seen ending overlying the gastroesophageal junction. This could be advanced 6 cm. The stomach is relatively decompressed. There is worsening distention of small-bowel loops to 4.9 cm in maximal diameter. Residual contrast is seen within the ascending colon. Clips are noted within the right upper quadrant, reflecting prior cholecystectomy. No free intra-abdominal air is seen, though evaluation for free air is limited on a single supine view. Retrocardiac airspace opacity may reflect pulmonary edema or pneumonia. No acute osseous abnormalities are seen. IMPRESSION: 1. Enteric tube noted ending overlying the gastroesophageal junction. This could be advanced 6 cm. The stomach is relatively decompressed. 2. Worsening distention of small-bowel loops to 4.9 cm in maximal diameter, concerning for interval worsening of small bowel obstruction. No free intra-abdominal air seen. 3. Retrocardiac airspace  opacity may reflect pulmonary edema or pneumonia. Electronically Signed   By: Garald Balding M.D.   On: 07/22/2017 06:18   Dg Abd Portable 1v  Result Date: 07/21/2017 CLINICAL DATA:  Follow up small bowel obstruction EXAM: PORTABLE ABDOMEN - 1 VIEW COMPARISON:  07/20/2017 FINDINGS: Nasogastric catheter is again noted with the tip in the stomach. Previously  seen contrast material throughout the small bowel is significantly diluted but appears to have passed into the distal small bowel and proximal colon. The degree of small bowel dilatation has improved somewhat in the interval from the prior exam. No free air is seen. Stable degenerative changes of lumbar spine are noted. IMPRESSION: Contrast material has passed into the colon although some persists in the distal small bowel. Some mild persistent small bowel dilatation is noted although improved when compared with the prior exam. Electronically Signed   By: Inez Catalina M.D.   On: 07/21/2017 07:18    Cardiac Studies    Patient Profile     76 y.o. female with a history of atrial fibrillation and recurrent small bowel obstructions.   She had a small NSTEMI Echo shows normal left ventricular function.  Assessment & Plan    1.  Paroxysmal atrial fibrillation she seems to be doing generally better.  She is in sinus rhythm today.  We will add scheduled Cardizem.  She is on a Cardizem drip. Continue metoprolol  2.  Non-ST segment elevation myocardial infarction: Most likely due to the stress of her small bowel obstruction plus her rapid atrial fibrillation.  Echocardiogram shows normal left ventricular systolic function.  She is a very poor candidate for invasive or interventional procedures.  We will continue with medical therapy.    For questions or updates, please contact [place] Please consult [email or web address] for contact info under Cardiology/STEMI.      Signed, Mertie Moores, MD  07/22/2017, 1:18 PM

## 2017-07-22 NOTE — Care Management Note (Signed)
Case Management Note  Patient Details  Name: Tamara Adams MRN: [id number] Date of Birth: [birth year]  Subjective/Objective:                  Sbo, a.fib with iv cardizem drip  Action/Plan: Date: July 22, 2017 Velva Harman, BSN, [place], [place] Chart and notes review for patient progress and needs. Will follow for case management and discharge needs. Next review date: [id number]  Expected Discharge Date:                  Expected Discharge Plan:  Home/Self Care  In-House Referral:     Discharge planning Services  CM Consult  Post Acute Care Choice:    Choice offered to:     DME Arranged:    DME Agency:     HH Arranged:    HH Agency:     Status of Service:  In process, will continue to follow  If discussed at Long Length of Stay Meetings, dates discussed:    Additional Comments:  Leeroy Cha, RN 07/22/2017, 8:14 AM

## 2017-07-22 NOTE — Progress Notes (Signed)
Paged by bedside RN regarding pts HR in the 220's. All other VSS at the time and the pt was asymptomatic. She does have a history of A-fib with RVR. EKG was performed showing an "Undetermined Rhythm with non-specific intra-ventricular conduction block." She was given 5mg  of Metoprolol IV which did not slow her rate so the dose was repeated which brought her HR into the 180's but subsequently dropped her BP to 99/75. A 551ml NS bolus was started. Upon entering the room the pt was A&Ox3, resting in bed,  and in NAD. She denied chest pain, tightness, and palpitations. Her HR was in the 160's-170' s at the time and she was given 10mg  of Cardizem IV bolus and started on a Cardizem gtt at 10mg /hr. Her HR was in the 130's-150's after initiation of the Cardizem gtt. She remained asymptomatic throughout.  The pt had also been c/o persistent nausea unrelieved by Zofran and Phenergan. She then began to have some emesis that was clear/brown. An NGT was placed on intermittent low wall suction and 1672mls was withdrawn. She stated that her nausea had improved and KUB was obtained to confirm placement. Upon exiting the room the pts HR was in the 130's-140's, all other VSS, her nausea had improved, and she was being transferred to stepdown for closer monitoring  Assessment and Plan 1. A-fib with RVR- Given 5mg  of Metoprolol IVx2, 10mg  of Cardizem IV, 588ml of NS, and a Cardizem gtt was initiated at 10mg /hr. Transferred to stepdown for closer monitoring.  2. Recurrent SBO- Pt with persistent nausea. NGT place to low wall suction which provided relief from nausea. Awaiting KUB for placement  CRITICAL CARE Performed by: Vertis Kelch   Total critical care time: 60 minutes  Critical care time was exclusive of separately billable procedures and treating other patients.  Critical care was necessary to treat or prevent imminent or life-threatening deterioration.  Critical care was time spent personally by me on  the following activities: development of treatment plan with patient and/or surrogate as well as nursing, discussions with consultants, evaluation of patient's response to treatment, examination of patient, obtaining history from patient or surrogate, ordering and performing treatments and interventions, ordering and review of laboratory studies, ordering and review of radiographic studies, pulse oximetry and re-evaluation of patient's condition.

## 2017-07-22 NOTE — Significant Event (Signed)
Rapid Response Event Note  Overview: Time Called: 0250 Arrival Time: 0252 Event Type: Cardiac  Initial Focused Assessment:  Pt noted to have afib rvr rate of 200-210, denies any chest pain, admits to being short of breath.  Orders for IV lopressor, resulting in conversion to NSR.  VSS as listed in flowsheet.  Pt states she feels better.     Interventions: Supportive  Plan of Care (if not transferred): Call extender back if HR sustains 140's or reverts to afib.  Event Summary:                  Tamara Adams

## 2017-07-22 NOTE — Progress Notes (Addendum)
[place] for heparin Indication: chest pain/ACS  Allergies  Allergen Reactions  . Maxidex [Dexamethasone] Other (See Comments)    DEHYDRATION AND HEART RACING  . Advair Diskus [Fluticasone-Salmeterol] Other (See Comments)    No benefit with lungs (INEFFECTIVE)  . Remeron [Mirtazapine] Other (See Comments)    CAUSED NIGHTMARES  . Trazodone And Nefazodone Other (See Comments)    Heart pounding  . Tylox [Oxycodone-Acetaminophen] Itching    Patient Measurements: Height: 5\' 3"  (160 cm) Weight: 84 lb 7 oz (38.3 kg) IBW/kg (Calculated) : 52.4 Heparin Dosing Weight: total body weight of 38.3 kg  Vital Signs: Temp: 98.6 F (37 C) (12/31 0243) Temp Source: Oral (12/31 0243) BP: 131/53 (12/31 0252) Pulse Rate: 251 (12/31 0243)  Labs: Recent Labs    07/19/17 0431  07/20/17 0416 07/21/17 0340 07/21/17 0821 07/21/17 1436 07/21/17 1746 07/22/17 0313  HGB  --    < > 10.4* 9.3*  --   --   --  9.6*  HCT  --   --  32.8* 30.3*  --   --   --  30.4*  PLT  --   --  410* 329  --   --   --  303  HEPARINUNFRC  --   --   --   --   --   --  0.24* 0.20*  CREATININE 0.90  --  1.17* 1.01*  --   --   --   --   TROPONINI  --   --   --   --  12.30* 11.33*  --   --    < > = values in this interval not displayed.    Estimated Creatinine Clearance: 28.7 mL/min (A) (by C-G formula based on SCr of 1.01 mg/dL (H)).   Medical History: Past Medical History:  Diagnosis Date  . Anemia    as a child  . Arthritis    "hands" (12/24/2014)  . Complication of anesthesia    " My blood gas dropped during surgery so I was left on a ventilator and in ICU for 3 days."  . COPD (chronic obstructive pulmonary disease) (HCC)    FVC 72%, FEV1 29%, FEV1 ratio 32% (very severe (COPD)  . COPD (chronic obstructive pulmonary disease) ([place])   . Degenerative disc disease   . Depression with anxiety   . Diverticulosis   . DVT (deep venous thrombosis) ([place])    "LLE; years ago  after a surgery"  . Essential hypertension   . Fluttering sensation of heart    pt put on metoprolol as a result  . GERD (gastroesophageal reflux disease)    PMH  . H/O hiatal hernia   . Headache    "maybe weekly" (12/24/2014)  . Hyperlipidemia   . Hypertension   . Kidney stone   . Liver lesion   . On home oxygen therapy    "3L; sleep w/it; use it when I rest" (12/24/2014)  . Peptic ulcer   . Pinched nerve    in back  . Pneumonia   . PONV (postoperative nausea and vomiting)   . Shortness of breath dyspnea    with exertion  . Skipped heart beats   . Small bowel obstruction ([place])    "several times; OR twice" (12/24/2014)  . Tubular adenoma of colon 09/2011  . Wears glasses      Assessment: 28 yoF admitted with recurrent SBO , now having bowel function with conservative management.  Topronins significantly  elevated today, starting heparin infusion for NSTEMI.    Last dose of Lovenox 30 mg q24h 12/29 10am. Noted patient's small body weight. Hgb low with downward trend from admission. No bleeding reported.  Platelets WNL. CrCl~28 ml/min.  12/30  First Heparin Level = 0.24 with heparin infusing @ 450 units/hr Today, 12/31  0313 HL=0.20 below goal, no infusion or bleeding issues per RN.   0245 a-fib w/ rvr with HR sustaining in 250s ordered IV lopressor x1- drip paused x 5 sec for lopressor IV push.  Goal of Therapy:  Heparin level 0.3-0.7 units/ml Monitor platelets by anticoagulation protocol: Yes   Plan:  Increase heparin infusion to 700 units/hr. Check heparin level 8 hours after rate increase Daily CBC and heparin level while on heparin infusion.  Dorrene German, PharmD 07/22/2017,3:48 AM

## 2017-07-22 NOTE — Progress Notes (Signed)
Pt converted to afib with rvr with HR sustaining in the 250s at 0245. Pt SOB and stated, " I am not feeling very well". Rapid response called. EKG obtained. Orders for IV lopressor given, resulting in pt converting to NSR. Pt resting comfortably now, vital signs stable.  Will continue to monitor closely.

## 2017-07-22 NOTE — Progress Notes (Signed)
Pt acutely confused and thought she was at home. Pt complaining of nausea despite nausea medicine. NP Bodenheimer notified. He instructed to page him if she has an episode of vomiting. No new orders at this time. Will continue to monitor closely.

## 2017-07-22 NOTE — Progress Notes (Signed)
Subjective/Chief Complaint: No complaints. Doesn't feel well but doesn't feel worse. Passing flatus   Objective: Vital signs in last 24 hours: Temp:  [98.5 F (36.9 C)-99.8 F (37.7 C)] 99 F (37.2 C) (12/31 0431) Pulse Rate:  [98-251] 214 (12/31 0458) Resp:  [20] 20 (12/31 0458) BP: (99-160)/(51-98) 111/58 (12/31 0537) SpO2:  [85 %-99 %] 98 % (12/31 0458) Last BM Date: 07/20/17  Intake/Output from previous day: 12/30 0701 - 12/31 0700 In: 1470 [I.V.:1470] Out: 1500 [Emesis/NG output:1500] Intake/Output this shift: No intake/output data recorded.  General appearance: alert and cooperative Resp: clear to auscultation bilaterally Cardio: regular rate and rhythm GI: soft, minimal tenderness. few bs  Lab Results:  Recent Labs    07/21/17 0340 07/22/17 0313  WBC 13.6* 11.0*  HGB 9.3* 9.6*  HCT 30.3* 30.4*  PLT 329 303   BMET Recent Labs    07/21/17 0340 07/22/17 0313  NA 147* 141  K 3.8 3.1*  CL 106 100*  CO2 28 33*  GLUCOSE 92 177*  BUN 26* 21*  CREATININE 1.01* 1.03*  CALCIUM 7.8* 8.1*   PT/INR No results for input(s): LABPROT, INR in the last 72 hours. ABG No results for input(s): PHART, HCO3 in the last 72 hours.  Invalid input(s): PCO2, PO2  Studies/Results: Dg Chest 1 View  Result Date: 07/22/2017 CLINICAL DATA:  Acute onset of shortness of breath. EXAM: CHEST 1 VIEW COMPARISON:  Chest radiograph performed 04/30/2016, and CT of the chest performed 07/04/2017 FINDINGS: The known left apical nodule is better characterized on prior CT. Bibasilar airspace opacities may reflect pulmonary edema or pneumonia, new from prior studies. Small bilateral pleural effusions are suspected. No pneumothorax is seen. The cardiomediastinal silhouette is normal in size. No acute osseous abnormalities are identified. The patient's enteric tube is seen extending below the diaphragm. IMPRESSION: 1. New bibasilar airspace opacities may reflect pulmonary edema or  pneumonia. Small bilateral pleural effusions suspected. 2. Known left apical nodule is better characterized on prior CT. Electronically Signed   By: Garald Balding M.D.   On: 07/22/2017 06:20   Dg Abd 1 View  Result Date: 07/22/2017 CLINICAL DATA:  Nasogastric tube placement. EXAM: ABDOMEN - 1 VIEW COMPARISON:  Abdominal radiograph performed 07/21/2017 FINDINGS: The patient's enteric tube is seen ending overlying the gastroesophageal junction. This could be advanced 6 cm. The stomach is relatively decompressed. There is worsening distention of small-bowel loops to 4.9 cm in maximal diameter. Residual contrast is seen within the ascending colon. Clips are noted within the right upper quadrant, reflecting prior cholecystectomy. No free intra-abdominal air is seen, though evaluation for free air is limited on a single supine view. Retrocardiac airspace opacity may reflect pulmonary edema or pneumonia. No acute osseous abnormalities are seen. IMPRESSION: 1. Enteric tube noted ending overlying the gastroesophageal junction. This could be advanced 6 cm. The stomach is relatively decompressed. 2. Worsening distention of small-bowel loops to 4.9 cm in maximal diameter, concerning for interval worsening of small bowel obstruction. No free intra-abdominal air seen. 3. Retrocardiac airspace opacity may reflect pulmonary edema or pneumonia. Electronically Signed   By: Garald Balding M.D.   On: 07/22/2017 06:18   Dg Abd Portable 1v  Result Date: 07/21/2017 CLINICAL DATA:  Follow up small bowel obstruction EXAM: PORTABLE ABDOMEN - 1 VIEW COMPARISON:  07/20/2017 FINDINGS: Nasogastric catheter is again noted with the tip in the stomach. Previously seen contrast material throughout the small bowel is significantly diluted but appears to have passed into the distal small  bowel and proximal colon. The degree of small bowel dilatation has improved somewhat in the interval from the prior exam. No free air is seen. Stable  degenerative changes of lumbar spine are noted. IMPRESSION: Contrast material has passed into the colon although some persists in the distal small bowel. Some mild persistent small bowel dilatation is noted although improved when compared with the prior exam. Electronically Signed   By: Inez Catalina M.D.   On: 07/21/2017 07:18   Dg Abd Portable 1v  Result Date: 07/20/2017 CLINICAL DATA:  Small bowel obstruction. EXAM: PORTABLE ABDOMEN - 1 VIEW COMPARISON:  Plain film 07/19/2017.  CT of 07/18/2017. FINDINGS: Nasogastric terminates at the body of the stomach. There is persistent but mildly improved gaseous distension of small bowel loops. Contrast has partially passed. Bladder contrast is incidentally noted. No free intraperitoneal air or other acute complication. Cholecystectomy. IMPRESSION: Improved appearance of small bowel obstruction. Electronically Signed   By: Abigail Miyamoto M.D.   On: 07/20/2017 10:47    Anti-infectives: Anti-infectives (From admission, onward)   Start     Dose/Rate Route Frequency Ordered Stop   07/19/17 0215  piperacillin-tazobactam (ZOSYN) IVPB 3.375 g  Status:  Discontinued     3.375 g 100 mL/hr over 30 Minutes Intravenous  Once 07/19/17 0204 07/19/17 0205      Assessment/Plan: s/p * No surgery found * Continue ng and bowel rest for sbo. xrays look worse but she seems ok  Hope is for this to resolve without surgery. She is poor surgical candidate given recent MI In and out of afib. Appreciate cardiology input Will follow  LOS: 3 days    TOTH III,PAUL S 07/22/2017

## 2017-07-22 NOTE — Progress Notes (Signed)
[place] for heparin Indication: chest pain/ACS  Allergies  Allergen Reactions  . Maxidex [Dexamethasone] Other (See Comments)    DEHYDRATION AND HEART RACING  . Advair Diskus [Fluticasone-Salmeterol] Other (See Comments)    No benefit with lungs (INEFFECTIVE)  . Remeron [Mirtazapine] Other (See Comments)    CAUSED NIGHTMARES  . Trazodone And Nefazodone Other (See Comments)    Heart pounding  . Tylox [Oxycodone-Acetaminophen] Itching    Patient Measurements: Height: 5\' 3"  (160 cm) Weight: 84 lb 7 oz (38.3 kg) IBW/kg (Calculated) : 52.4 Heparin Dosing Weight: total body weight of 38.3 kg  Vital Signs: Temp: 100.7 F (38.2 C) (12/31 1200) Temp Source: Axillary (12/31 1200) BP: 103/32 (12/31 1000) Pulse Rate: 90 (12/31 1000)  Labs: Recent Labs    07/20/17 0416 07/21/17 0340 07/21/17 0821 07/21/17 1436 07/21/17 1746 07/22/17 0313 07/22/17 1222  HGB 10.4* 9.3*  --   --   --  9.6*  --   HCT 32.8* 30.3*  --   --   --  30.4*  --   PLT 410* 329  --   --   --  303  --   HEPARINUNFRC  --   --   --   --  0.24* 0.20* 0.28*  CREATININE 1.17* 1.01*  --   --   --  1.03*  --   TROPONINI  --   --  12.30* 11.33*  --  6.31*  --     Estimated Creatinine Clearance: 28.1 mL/min (A) (by C-G formula based on SCr of 1.03 mg/dL (H)).  Assessment: 59 yoF admitted with recurrent SBO , now having bowel function with conservative management.  Topronins significantly elevated today, starting heparin infusion for NSTEMI.    Last dose of Lovenox 30 mg q24h 12/29 10am. Noted patient's small body weight. Hgb low with downward trend from admission. No bleeding reported.  Platelets WNL. CrCl~28 ml/min.  Today, 12/31  1222 HL=0.28 below goal after rate increased to 700 units/hr, but almost within 0.3 - 0.7 range, no infusion or bleeding issues per RN.    Goal of Therapy:  Heparin level 0.3-0.7 units/ml Monitor platelets by anticoagulation protocol:  Yes   Plan:  Increase heparin infusion to 750 units/hr. Check heparin level 8 hours after rate increase Daily CBC and heparin level while on heparin infusion.  Eudelia Bunch, Pharm.D. [phone] 07/22/2017 1:13 PM

## 2017-07-23 ENCOUNTER — Inpatient Hospital Stay (HOSPITAL_COMMUNITY): Payer: Medicare Other

## 2017-07-23 LAB — BASIC METABOLIC PANEL
ANION GAP: 9 (ref 5–15)
BUN: 32 mg/dL — AB (ref 6–20)
CO2: 38 mmol/L — AB (ref 22–32)
Calcium: 7.9 mg/dL — ABNORMAL LOW (ref 8.9–10.3)
Chloride: 98 mmol/L — ABNORMAL LOW (ref 101–111)
Creatinine, Ser: 1.19 mg/dL — ABNORMAL HIGH (ref 0.44–1.00)
GFR calc Af Amer: 50 mL/min — ABNORMAL LOW (ref >60)
GFR calc non Af Amer: 43 mL/min — ABNORMAL LOW (ref >60)
GLUCOSE: 112 mg/dL — AB (ref 65–99)
Potassium: 3.2 mmol/L — ABNORMAL LOW (ref 3.5–5.1)
Sodium: 145 mmol/L (ref 135–145)

## 2017-07-23 LAB — HEPARIN LEVEL (UNFRACTIONATED)
HEPARIN UNFRACTIONATED: 0.23 [IU]/mL — AB (ref 0.30–0.70)
Heparin Unfractionated: 0.26 IU/mL — ABNORMAL LOW (ref 0.30–0.70)

## 2017-07-23 LAB — CBC
HEMATOCRIT: 26.3 % — AB (ref 36.0–46.0)
Hemoglobin: 8.3 g/dL — ABNORMAL LOW (ref 12.0–15.0)
MCH: 28.3 pg (ref 26.0–34.0)
MCHC: 31.6 g/dL (ref 30.0–36.0)
MCV: 89.8 fL (ref 78.0–100.0)
Platelets: 288 10*3/uL (ref 150–400)
RBC: 2.93 MIL/uL — ABNORMAL LOW (ref 3.87–5.11)
RDW: 15.5 % (ref 11.5–15.5)
WBC: 11.3 10*3/uL — AB (ref 4.0–10.5)

## 2017-07-23 LAB — TROPONIN I
TROPONIN I: 2.57 ng/mL — AB (ref <0.03)
TROPONIN I: 1.98 ng/mL — AB (ref <0.03)
Troponin I: 1.95 ng/mL (ref <0.03)

## 2017-07-23 LAB — TSH: TSH: 0.462 u[IU]/mL (ref 0.350–4.500)

## 2017-07-23 MED ORDER — METOPROLOL TARTRATE 5 MG/5ML IV SOLN
5.0000 mg | Freq: Four times a day (QID) | INTRAVENOUS | Status: DC
  Administered 2017-07-23 – 2017-07-25 (×8): 5 mg via INTRAVENOUS
  Filled 2017-07-23 (×7): qty 5

## 2017-07-23 MED ORDER — NITROGLYCERIN 0.4 MG SL SUBL
0.4000 mg | SUBLINGUAL_TABLET | SUBLINGUAL | Status: DC | PRN
  Administered 2017-07-23: 0.4 mg via SUBLINGUAL
  Filled 2017-07-23: qty 1

## 2017-07-23 MED ORDER — METOPROLOL TARTRATE 5 MG/5ML IV SOLN: 2.5000 mg | Freq: Four times a day (QID) | INTRAVENOUS | Status: DC

## 2017-07-23 MED ORDER — HEPARIN (PORCINE) IN NACL 100-0.45 UNIT/ML-% IJ SOLN
1000.0000 [IU]/h | INTRAMUSCULAR | Status: DC
  Administered 2017-07-23: 1000 [IU]/h via INTRAVENOUS
  Filled 2017-07-23: qty 250

## 2017-07-23 MED ORDER — HEPARIN (PORCINE) IN NACL 100-0.45 UNIT/ML-% IJ SOLN: 900.0000 [IU]/h | INTRAMUSCULAR | Status: DC

## 2017-07-23 MED ORDER — POTASSIUM CHLORIDE 10 MEQ/100ML IV SOLN
10.0000 meq | INTRAVENOUS | Status: AC
  Administered 2017-07-23 (×3): 10 meq via INTRAVENOUS
  Filled 2017-07-23 (×3): qty 100

## 2017-07-23 MED ORDER — NITROGLYCERIN 2 % TD OINT
1.0000 [in_us] | TOPICAL_OINTMENT | Freq: Four times a day (QID) | TRANSDERMAL | Status: AC
  Administered 2017-07-23 – 2017-07-24 (×4): 1 [in_us] via TOPICAL
  Filled 2017-07-23: qty 30

## 2017-07-23 NOTE — Progress Notes (Signed)
[place] for heparin Indication: chest pain/ACS  Allergies  Allergen Reactions  . Maxidex [Dexamethasone] Other (See Comments)    DEHYDRATION AND HEART RACING  . Advair Diskus [Fluticasone-Salmeterol] Other (See Comments)    No benefit with lungs (INEFFECTIVE)  . Remeron [Mirtazapine] Other (See Comments)    CAUSED NIGHTMARES  . Trazodone And Nefazodone Other (See Comments)    Heart pounding  . Tylox [Oxycodone-Acetaminophen] Itching    Patient Measurements: Height: 5\' 3"  (160 cm) Weight: 85 lb 15.7 oz (39 kg) IBW/kg (Calculated) : 52.4 Heparin Dosing Weight: total body weight of 38.3 kg  Vital Signs: Temp: 98.2 F (36.8 C) (01/01 1600) Temp Source: Oral (01/01 1600) BP: 157/59 (01/01 1735) Pulse Rate: 102 (01/01 1735)  Labs: Recent Labs    07/21/17 0340  07/22/17 0313  07/22/17 2312 07/23/17 0323 07/23/17 0726 07/23/17 0919 07/23/17 1453 07/23/17 1720  HGB 9.3*  --  9.6*  --   --  8.3*  --   --   --   --   HCT 30.3*  --  30.4*  --   --  26.3*  --   --   --   --   PLT 329  --  303  --   --  288  --   --   --   --   HEPARINUNFRC  --    < > 0.20*   < > 0.26*  --  0.26*  --   --  0.23*  CREATININE 1.01*  --  1.03*  --   --  1.19*  --   --   --   --   TROPONINI  --    < > 6.31*  --   --   --   --  2.57* 1.98*  --    < > = values in this interval not displayed.    Estimated Creatinine Clearance: 24.8 mL/min (A) (by C-G formula based on SCr of 1.19 mg/dL (H)).  Assessment: 79 yoF admitted with recurrent SBO , now having bowel function with conservative management.  Topronins significantly elevated, starting heparin infusion for NSTEMI.    07/23/2017  0730 HL=0.26 below goal after rate increased to 850  units/hr, but almost within 0.3 - 0.7 range, no infusion or bleeding issues per RN. Hg down to 8.3, PLTC remains WNL.    1720 heparin level = 0.23 despite rate increase to 900 units/hr.  Spoke with RN who reports that IV line  has occluded several times during her shift due to patient bending her arm, but no other interruptions with infusion.  No bleeding reported.  Goal of Therapy:  Heparin level 0.3-0.7 units/ml Monitor platelets by anticoagulation protocol: Yes   Plan:  Increase heparin infusion to 1000 units/hr. Check heparin level with AM labs 1/2 Daily CBC and heparin level while on heparin infusion.  Peggyann Juba, PharmD, BCPS Pager: [phone] 07/23/2017 6:34 PM

## 2017-07-23 NOTE — Progress Notes (Signed)
Progress Note  Patient Name: Tamara Adams Date of Encounter: 07/23/2017  Primary Cardiologist:   previous [place], new to [facility]   Subjective   77 year old female with a history of severe COPD, hypertension, and recurrent small bowel obstructions.  She was admitted this time with a recurrent small bowel obstruction and developed atrial fibrillation.  She had a non-ST segment elevation myocardial infarction as a complication of her small bowel obstruction and the rapid atrial fibrillation.  Echocardiogram reveals well-preserved left ventricular systolic function.  She is  Having mild chest tightness this am .   She  has persistent abdominal pain.  Inpatient Medications    Scheduled Meds: . digoxin  0.25 mg Intravenous Once  . fluticasone furoate-vilanterol  1 puff Inhalation Daily  . levalbuterol  0.63 mg Nebulization TID  . mouth rinse  15 mL Mouth Rinse BID  . metoprolol tartrate  2.5 mg Intravenous Q6H  . polyethylene glycol  17 g Oral Daily  . trimethoprim-polymyxin b  1 drop Both Eyes Q6H  . umeclidinium bromide  1 puff Inhalation Daily   Continuous Infusions: . sodium chloride 10 mL/hr at 07/23/17 0600  . diltiazem (CARDIZEM) infusion 2.5 mg/hr (07/23/17 0600)  . famotidine (PEPCID) IV Stopped (07/22/17 2154)  . heparin 850 Units/hr (07/23/17 0600)  . potassium chloride     PRN Meds: acetaminophen **OR** acetaminophen, hydrALAZINE, HYDROmorphone (DILAUDID) injection, levalbuterol, lidocaine, [DISCONTINUED] ondansetron **OR** ondansetron (ZOFRAN) IV, phenol, promethazine   Vital Signs    Vitals:   07/23/17 0400 07/23/17 0500 07/23/17 0600 07/23/17 0800  BP: (!) 137/35 (!) 137/43 (!) 134/44   Pulse: 95 98 97   Resp: 18 19 19    Temp:    99 F (37.2 C)  TempSrc:    Oral  SpO2: 97% 97% 99%   Weight:      Height:        Intake/Output Summary (Last 24 hours) at 07/23/2017 0829 Last data filed at 07/23/2017 0600 Gross per 24 hour  Intake 1633.59 ml  Output 3300 ml    Net -1666.41 ml   Filed Weights   07/18/17 2020 07/19/17 0251 07/23/17 0346  Weight: 88 lb (39.9 kg) 84 lb 7 oz (38.3 kg) 85 lb 15.7 oz (39 kg)    Telemetry    Sinus tach at 100  - Personally Reviewed  ECG     NSR , mild diffuse ST depression  - Personally Reviewed  Physical Exam   GEN: No acute distress.   Neck: No JVD Cardiac: RRR, mildly  tachycardic  Respiratory: Clear to auscultation bilaterally. GI: Soft, nontender, non-distended  MS: No edema; No deformity. Neuro:  Nonfocal  Psych: Normal affect   Labs    Chemistry Recent Labs  Lab 07/18/17 2040  07/21/17 0340 07/22/17 0313 07/23/17 0323  NA 135   < > 147* 141 145  K 4.3   < > 3.8 3.1* 3.2*  CL 93*   < > 106 100* 98*  CO2 31   < > 28 33* 38*  GLUCOSE 171*   < > 92 177* 112*  BUN 9   < > 26* 21* 32*  CREATININE 0.77   < > 1.01* 1.03* 1.19*  CALCIUM 9.3   < > 7.8* 8.1* 7.9*  PROT 7.3  --   --   --   --   ALBUMIN 4.1  --   --   --   --   AST 28  --   --   --   --  ALT 15  --   --   --   --   ALKPHOS 74  --   --   --   --   BILITOT 0.4  --   --   --   --   GFRNONAA >60   < > 53* 51* 43*  GFRAA >60   < > >60 60* 50*  ANIONGAP 11   < > 13 8 9    < > = values in this interval not displayed.     Hematology Recent Labs  Lab 07/21/17 0340 07/22/17 0313 07/23/17 0323  WBC 13.6* 11.0* 11.3*  RBC 3.37* 3.47* 2.93*  HGB 9.3* 9.6* 8.3*  HCT 30.3* 30.4* 26.3*  MCV 89.9 87.6 89.8  MCH 27.6 27.7 28.3  MCHC 30.7 31.6 31.6  RDW 15.0 15.3 15.5  PLT 329 303 288    Cardiac Enzymes Recent Labs  Lab 07/21/17 0821 07/21/17 1436 07/22/17 0313  TROPONINI 12.30* 11.33* 6.31*   No results for input(s): TROPIPOC in the last 168 hours.   BNPNo results for input(s): BNP, PROBNP in the last 168 hours.   DDimer No results for input(s): DDIMER in the last 168 hours.   Radiology    Dg Chest 1 View  Result Date: 07/22/2017 CLINICAL DATA:  Acute onset of shortness of breath. EXAM: CHEST 1 VIEW COMPARISON:   Chest radiograph performed 04/30/2016, and CT of the chest performed 07/04/2017 FINDINGS: The known left apical nodule is better characterized on prior CT. Bibasilar airspace opacities may reflect pulmonary edema or pneumonia, new from prior studies. Small bilateral pleural effusions are suspected. No pneumothorax is seen. The cardiomediastinal silhouette is normal in size. No acute osseous abnormalities are identified. The patient's enteric tube is seen extending below the diaphragm. IMPRESSION: 1. New bibasilar airspace opacities may reflect pulmonary edema or pneumonia. Small bilateral pleural effusions suspected. 2. Known left apical nodule is better characterized on prior CT. Electronically Signed   By: Garald Balding M.D.   On: 07/22/2017 06:20   Dg Abd 1 View  Result Date: 07/22/2017 CLINICAL DATA:  Nasogastric tube placement. EXAM: ABDOMEN - 1 VIEW COMPARISON:  Abdominal radiograph performed 07/21/2017 FINDINGS: The patient's enteric tube is seen ending overlying the gastroesophageal junction. This could be advanced 6 cm. The stomach is relatively decompressed. There is worsening distention of small-bowel loops to 4.9 cm in maximal diameter. Residual contrast is seen within the ascending colon. Clips are noted within the right upper quadrant, reflecting prior cholecystectomy. No free intra-abdominal air is seen, though evaluation for free air is limited on a single supine view. Retrocardiac airspace opacity may reflect pulmonary edema or pneumonia. No acute osseous abnormalities are seen. IMPRESSION: 1. Enteric tube noted ending overlying the gastroesophageal junction. This could be advanced 6 cm. The stomach is relatively decompressed. 2. Worsening distention of small-bowel loops to 4.9 cm in maximal diameter, concerning for interval worsening of small bowel obstruction. No free intra-abdominal air seen. 3. Retrocardiac airspace opacity may reflect pulmonary edema or pneumonia. Electronically Signed    By: Garald Balding M.D.   On: 07/22/2017 06:18    Cardiac Studies      Patient Profile     77 y.o. female admitted with a small bowel obstruction.  She developed rapid atrial fibrillation and a non-ST segment elevation myocardial infarction.  Assessment & Plan    1.  Coronary artery disease: The patient is status post non-ST segment elevation myocardial infarction in the setting of small bowel obstruction in rapid atrial  fibrillation.  She is still having little bit of chest tightness.  We will give her sublingual nitroglycerin. Will add NTG paste. Increase metoprolol to 5 mg IV Q 6 hr.   As discussed previously.  She is a very poor candidate for invasive or interventional procedures.  She has severe COPD.  Currently she is n.p.o. because of a small bowel obstruction and would not be able to take antiplatelet medications on a reliable basis. She does not want to be aggressive with any operations or procedures.  She does not wish to have an abdominal procedure   2.  Atrial fibrillation: She is converted to normal sinus rhythm.  Continue current low-dose diltiazem drip.  We will increase metoprolol.    For questions or updates, please contact [facility] Please consult [email or web address] for contact info under Cardiology/STEMI.      Signed, Mertie Moores, MD  07/23/2017, 8:29 AM

## 2017-07-23 NOTE — Progress Notes (Addendum)
[place] for heparin Indication: chest pain/ACS  Allergies  Allergen Reactions  . Maxidex [Dexamethasone] Other (See Comments)    DEHYDRATION AND HEART RACING  . Advair Diskus [Fluticasone-Salmeterol] Other (See Comments)    No benefit with lungs (INEFFECTIVE)  . Remeron [Mirtazapine] Other (See Comments)    CAUSED NIGHTMARES  . Trazodone And Nefazodone Other (See Comments)    Heart pounding  . Tylox [Oxycodone-Acetaminophen] Itching    Patient Measurements: Height: 5\' 3"  (160 cm) Weight: 85 lb 15.7 oz (39 kg) IBW/kg (Calculated) : 52.4 Heparin Dosing Weight: total body weight of 38.3 kg  Vital Signs: Temp: 99 F (37.2 C) (01/01 0800) Temp Source: Oral (01/01 0800) BP: 138/42 (01/01 0800) Pulse Rate: 95 (01/01 0800)  Labs: Recent Labs    07/21/17 0340 07/21/17 0821 07/21/17 1436  07/22/17 0313 07/22/17 1222 07/22/17 2312 07/23/17 0323 07/23/17 0726  HGB 9.3*  --   --   --  9.6*  --   --  8.3*  --   HCT 30.3*  --   --   --  30.4*  --   --  26.3*  --   PLT 329  --   --   --  303  --   --  288  --   HEPARINUNFRC  --   --   --    < > 0.20* 0.28* 0.26*  --  0.26*  CREATININE 1.01*  --   --   --  1.03*  --   --  1.19*  --   TROPONINI  --  12.30* 11.33*  --  6.31*  --   --   --   --    < > = values in this interval not displayed.    Estimated Creatinine Clearance: 24.8 mL/min (A) (by C-G formula based on SCr of 1.19 mg/dL (H)).  Assessment: 42 yoF admitted with recurrent SBO , now having bowel function with conservative management.  Topronins significantly elevated, starting heparin infusion for NSTEMI.    07/23/2017  0730 HL=0.26 below goal after rate increased to 850  units/hr, but almost within 0.3 - 0.7 range, no infusion or bleeding issues per RN. Hg down to 8.3, PLTC remains WNL.  Goal of Therapy:  Heparin level 0.3-0.7 units/ml Monitor platelets by anticoagulation protocol: Yes   Plan:  Increase heparin infusion to  900 units/hr. Check heparin level 8 hours after rate increase Daily CBC and heparin level while on heparin infusion.  Eudelia Bunch, Pharm.D. [phone] 07/23/2017 8:46 AM

## 2017-07-23 NOTE — Progress Notes (Addendum)
Subjective/Chief Complaint: No complaints. Feels about the same. Still passing flatus   Objective: Vital signs in last 24 hours: Temp:  [98.6 F (37 C)-100.7 F (38.2 C)] 99 F (37.2 C) (01/01 0800) Pulse Rate:  [29-112] 102 (01/01 0850) Resp:  [16-29] 17 (01/01 0850) BP: (103-153)/(29-62) 138/42 (01/01 0800) SpO2:  [94 %-100 %] 100 % (01/01 0850) Weight:  [39 kg (85 lb 15.7 oz)] 39 kg (85 lb 15.7 oz) (01/01 0346) Last BM Date: 07/20/17  Intake/Output from previous day: 12/31 0701 - 01/01 0700 In: 1633.6 [I.V.:783.6; IV Piggyback:850] Out: 3300 [Urine:900; Emesis/NG output:2400] Intake/Output this shift: No intake/output data recorded.  General appearance: alert and cooperative Resp: clear to auscultation bilaterally Cardio: regular rate and rhythm GI: soft, nontender. not distended  Lab Results:  Recent Labs    07/22/17 0313 07/23/17 0323  WBC 11.0* 11.3*  HGB 9.6* 8.3*  HCT 30.4* 26.3*  PLT 303 288   BMET Recent Labs    07/22/17 0313 07/23/17 0323  NA 141 145  K 3.1* 3.2*  CL 100* 98*  CO2 33* 38*  GLUCOSE 177* 112*  BUN 21* 32*  CREATININE 1.03* 1.19*  CALCIUM 8.1* 7.9*   PT/INR No results for input(s): LABPROT, INR in the last 72 hours. ABG No results for input(s): PHART, HCO3 in the last 72 hours.  Invalid input(s): PCO2, PO2  Studies/Results: Dg Chest 1 View  Result Date: 07/22/2017 CLINICAL DATA:  Acute onset of shortness of breath. EXAM: CHEST 1 VIEW COMPARISON:  Chest radiograph performed 04/30/2016, and CT of the chest performed 07/04/2017 FINDINGS: The known left apical nodule is better characterized on prior CT. Bibasilar airspace opacities may reflect pulmonary edema or pneumonia, new from prior studies. Small bilateral pleural effusions are suspected. No pneumothorax is seen. The cardiomediastinal silhouette is normal in size. No acute osseous abnormalities are identified. The patient's enteric tube is seen extending below the  diaphragm. IMPRESSION: 1. New bibasilar airspace opacities may reflect pulmonary edema or pneumonia. Small bilateral pleural effusions suspected. 2. Known left apical nodule is better characterized on prior CT. Electronically Signed   By: Garald Balding M.D.   On: 07/22/2017 06:20   Dg Abd 1 View  Result Date: 07/22/2017 CLINICAL DATA:  Nasogastric tube placement. EXAM: ABDOMEN - 1 VIEW COMPARISON:  Abdominal radiograph performed 07/21/2017 FINDINGS: The patient's enteric tube is seen ending overlying the gastroesophageal junction. This could be advanced 6 cm. The stomach is relatively decompressed. There is worsening distention of small-bowel loops to 4.9 cm in maximal diameter. Residual contrast is seen within the ascending colon. Clips are noted within the right upper quadrant, reflecting prior cholecystectomy. No free intra-abdominal air is seen, though evaluation for free air is limited on a single supine view. Retrocardiac airspace opacity may reflect pulmonary edema or pneumonia. No acute osseous abnormalities are seen. IMPRESSION: 1. Enteric tube noted ending overlying the gastroesophageal junction. This could be advanced 6 cm. The stomach is relatively decompressed. 2. Worsening distention of small-bowel loops to 4.9 cm in maximal diameter, concerning for interval worsening of small bowel obstruction. No free intra-abdominal air seen. 3. Retrocardiac airspace opacity may reflect pulmonary edema or pneumonia. Electronically Signed   By: Garald Balding M.D.   On: 07/22/2017 06:18    Anti-infectives: Anti-infectives (From admission, onward)   Start     Dose/Rate Route Frequency Ordered Stop   07/19/17 0215  piperacillin-tazobactam (ZOSYN) IVPB 3.375 g  Status:  Discontinued     3.375 g 100 mL/hr over  30 Minutes Intravenous  Once 07/19/17 0204 07/19/17 0205      Assessment/Plan: s/p * No surgery found * Continue ng and bowel rest for sbo . Seems to be improving Repeat abd xrays MI per  cards  LOS: 4 days    TOTH III,Gram Siedlecki S 07/23/2017

## 2017-07-23 NOTE — Progress Notes (Addendum)
[facility] PROGRESS NOTE  Tamara Adams ZSW:[id number] DOB: Nov 16, 1940 DOA: 07/18/2017  PCP: [name], MD  Brief History/Interval Summary: 77 year old Caucasian female with a past medical history of COPD on home oxygen, chronic abdominal pain, history of multiple surgeries, multiple small bowel obstruction presented with increasing abdominal pain nausea and vomiting.  She was found to have small bowel obstruction.  She was hospitalized.  The hospital stay has been complicated by onset of atrial fibrillation with RVR.  Cardiology was consulted.  Patient had to be transferred to the stepdown unit for closer monitoring.    Reason for Visit: Atrial fibrillation with RVR.  Small bowel obstruction.  Consultants: General surgery.  Cardiology.  Procedures:  Transthoracic echocardiogram Study Conclusions  - Left ventricle: The cavity size was normal. Wall thickness was   normal. Systolic function was normal. The estimated ejection   fraction was in the range of 60% to 65%. Wall motion was normal;   there were no regional wall motion abnormalities. Doppler   parameters are consistent with abnormal left ventricular   relaxation (grade 1 diastolic dysfunction). The E/e&' ratio is   between 8-15, suggesting indeterminate LV fililng pressure. - Mitral valve: Calcified annulus. Mildly thickened leaflets .   There was trivial regurgitation. - Left atrium: The atrium was mildly dilated. - Atrial septum: There was increased thickness of the septum,   consistent with lipomatous hypertrophy. - Inferior vena cava: The vessel was normal in size. The   respirophasic diameter changes were in the normal range (>= 50%),   consistent with normal central venous pressure.  Impressions:  - Compared to a prior study in 2017, the LVEF is higher at 60-65%.   There is now mild LAE.  Antibiotics: None  Subjective/Interval History: Patient states that overall she is feeling well.  She  did complain of some chest tightness when she woke up this morning.  Denies any shortness of breath.  No nausea.  Abdominal pain is better though still persists.  Passing some gas.  ROS: Denies any headaches.  Objective:  Vital Signs  Vitals:   07/23/17 0346 07/23/17 0400 07/23/17 0500 07/23/17 0600  BP:  (!) 137/35 (!) 137/43 (!) 134/44  Pulse:  95 98 97  Resp:  18 19 19   Temp: 98.7 F (37.1 C)     TempSrc: Oral     SpO2:  97% 97% 99%  Weight: 39 kg (85 lb 15.7 oz)     Height:        Intake/Output Summary (Last 24 hours) at 07/23/2017 0753 Last data filed at 07/23/2017 0600 Gross per 24 hour  Intake 1633.59 ml  Output 3300 ml  Net -1666.41 ml   Filed Weights   07/18/17 2020 07/19/17 0251 07/23/17 0346  Weight: 39.9 kg (88 lb) 38.3 kg (84 lb 7 oz) 39 kg (85 lb 15.7 oz)    General appearance: Awake alert.  No distress Resp: Slightly improved aeration bilaterally.  No definite crackles heard today.  No wheezing.  Normal effort at rest. Cardio: S1-S2 noted to be regular.  No S3-S4.  Systolic murmur appreciated over the precordium.   GI: Abdomen is noted to be slightly soft today.  Mildly tender diffusely without any rebound rigidity or guarding.  Bowel sounds are present sluggish.  No masses.   Extremities: No Edema Neurologic: No focal neurological deficits..   Lab Results:  Data Reviewed: I have personally reviewed following labs and imaging studies  CBC: Recent Labs  Lab 07/18/17 2033 07/20/17  6144 07/21/17 0340 07/22/17 0313 07/23/17 0323  WBC 10.3 13.4* 13.6* 11.0* 11.3*  NEUTROABS 7.6  --   --   --   --   HGB 11.0* 10.4* 9.3* 9.6* 8.3*  HCT 34.8* 32.8* 30.3* 30.4* 26.3*  MCV 86.6 88.6 89.9 87.6 89.8  PLT 380 410* 329 303 315    Basic Metabolic Panel: Recent Labs  Lab 07/19/17 0431 07/20/17 0416 07/21/17 0340 07/22/17 0313 07/23/17 0323  NA 135 142 147* 141 145  K 4.1 3.6 3.8 3.1* 3.2*  CL 92* 95* 106 100* 98*  CO2 32 35* 28 33* 38*  GLUCOSE 150*  95 92 177* 112*  BUN 12 26* 26* 21* 32*  CREATININE 0.90 1.17* 1.01* 1.03* 1.19*  CALCIUM 9.2 8.8* 7.8* 8.1* 7.9*    GFR: Estimated Creatinine Clearance: 24.8 mL/min (A) (by C-G formula based on SCr of 1.19 mg/dL (H)).  Liver Function Tests: Recent Labs  Lab 07/18/17 2040  AST 28  ALT 15  ALKPHOS 74  BILITOT 0.4  PROT 7.3  ALBUMIN 4.1    Cardiac Enzymes: Recent Labs  Lab 07/21/17 0821 07/21/17 1436 07/22/17 0313  TROPONINI 12.30* 11.33* 6.31*     Recent Results (from the past 240 hour(s))  MRSA PCR Screening     Status: None   Collection Time: 07/22/17  6:34 AM  Result Value Ref Range Status   MRSA by PCR NEGATIVE NEGATIVE Final    Comment:        The GeneXpert MRSA Assay (FDA approved for NASAL specimens only), is one component of a comprehensive MRSA colonization surveillance program. It is not intended to diagnose MRSA infection nor to guide or monitor treatment for MRSA infections.       Radiology Studies: Dg Chest 1 View  Result Date: 07/22/2017 CLINICAL DATA:  Acute onset of shortness of breath. EXAM: CHEST 1 VIEW COMPARISON:  Chest radiograph performed 04/30/2016, and CT of the chest performed 07/04/2017 FINDINGS: The known left apical nodule is better characterized on prior CT. Bibasilar airspace opacities may reflect pulmonary edema or pneumonia, new from prior studies. Small bilateral pleural effusions are suspected. No pneumothorax is seen. The cardiomediastinal silhouette is normal in size. No acute osseous abnormalities are identified. The patient's enteric tube is seen extending below the diaphragm. IMPRESSION: 1. New bibasilar airspace opacities may reflect pulmonary edema or pneumonia. Small bilateral pleural effusions suspected. 2. Known left apical nodule is better characterized on prior CT. Electronically Signed   By: Garald Balding M.D.   On: 07/22/2017 06:20   Dg Abd 1 View  Result Date: 07/22/2017 CLINICAL DATA:  Nasogastric tube  placement. EXAM: ABDOMEN - 1 VIEW COMPARISON:  Abdominal radiograph performed 07/21/2017 FINDINGS: The patient's enteric tube is seen ending overlying the gastroesophageal junction. This could be advanced 6 cm. The stomach is relatively decompressed. There is worsening distention of small-bowel loops to 4.9 cm in maximal diameter. Residual contrast is seen within the ascending colon. Clips are noted within the right upper quadrant, reflecting prior cholecystectomy. No free intra-abdominal air is seen, though evaluation for free air is limited on a single supine view. Retrocardiac airspace opacity may reflect pulmonary edema or pneumonia. No acute osseous abnormalities are seen. IMPRESSION: 1. Enteric tube noted ending overlying the gastroesophageal junction. This could be advanced 6 cm. The stomach is relatively decompressed. 2. Worsening distention of small-bowel loops to 4.9 cm in maximal diameter, concerning for interval worsening of small bowel obstruction. No free intra-abdominal air seen. 3. Retrocardiac airspace  opacity may reflect pulmonary edema or pneumonia. Electronically Signed   By: Garald Balding M.D.   On: 07/22/2017 06:18     Medications:  Scheduled: . digoxin  0.25 mg Intravenous Once  . fluticasone furoate-vilanterol  1 puff Inhalation Daily  . levalbuterol  0.63 mg Nebulization TID  . mouth rinse  15 mL Mouth Rinse BID  . metoprolol tartrate  2.5 mg Intravenous Q6H  . polyethylene glycol  17 g Oral Daily  . trimethoprim-polymyxin b  1 drop Both Eyes Q6H  . umeclidinium bromide  1 puff Inhalation Daily   Continuous: . sodium chloride 10 mL/hr at 07/23/17 0600  . diltiazem (CARDIZEM) infusion 2.5 mg/hr (07/23/17 0600)  . famotidine (PEPCID) IV Stopped (07/22/17 2154)  . heparin 850 Units/hr (07/23/17 0600)  . potassium chloride     KKX:[id number] **OR** acetaminophen, hydrALAZINE, HYDROmorphone (DILAUDID) injection, levalbuterol, lidocaine, [DISCONTINUED] ondansetron **OR**  ondansetron (ZOFRAN) IV, phenol, promethazine  Assessment/Plan:  Principal Problem:   SBO (small bowel obstruction) (HCC) Active Problems:   COPD (chronic obstructive pulmonary disease) (HCC)   Chronic hypoxemic respiratory failure (HCC)   Essential hypertension   Persistent atrial fibrillation (HCC)   Encounter for nasogastric (NG) tube placement    Recurrent small bowel obstruction General surgery is following.  Looks like NG tube was removed on 12/30. However over the course of the night on 12/30 patient developed nausea vomiting.  NG tube had to be placed back.  General surgery continues to follow.  Management deferred to them.  Atrial fibrillation with RVR The patient had another episode of rapid ventricular response on the night of 12/30.  She was placed back on intravenous Cardizem.  Has been in sinus rhythm since yesterday morning.  Remains on low-dose Cardizem infusion.  Echocardiogram as above.  Shows normal systolic function.  Grade 1 diastolic dysfunction.  No wall motion abnormalities noted.  TSH normal.  Most likely her atrial fibrillation is due to her acute illness.  Cardiology recommends low-dose anticoagulation when ready to take orally.  Currently on IV heparin.  Elevated troponin/non-ST elevation MI/mild pulmonary edema Patient with significantly elevated troponin level.  Seen by cardiology and not thought to be a good candidate for interventional or invasive procedures.  Patient complained of some chest tightness this morning.  EKG does not show any new changes.  Troponin level high but better than what it was.  Patient started on nitroglycerin by cardiology.  Beta-blocker.  Patient given small dose of Lasix yesterday.  Creatinine noted to be higher today.  Quite possible crackles might have been due to aspiration rather than pulmonary edema.  Hold off on further doses of Lasix.  Continue to monitor for now.   Chronic hypoxemic respiratory failure secondary to COPD Uses  oxygen at home at baseline.    Acute kidney injury/Hypokalemia Creatinine noted to be slightly higher today.  Patient was given Lasix yesterday as there was concern for pulmonary edema.  However it is quite possible which that the crackles may have been due to aspiration.  We will continue with gentle IV hydration for now.  Monitor volume status closely.  Monitor urine output.  Replace potassium.    History of COPD Without acute exacerbation.  Stable.  Continue to monitor.  History of essential hypertension Monitor blood pressures closely.  She is on multiple blood pressure lowering agents at home.  Metoprolol intravenously.  Normocytic anemia Probably anemia of chronic disease.  Hemoglobin noted to be lower today.  No evidence for overt bleeding.  Continue to monitor.    DVT Prophylaxis: On IV heparin Code Status: Full code Family Communication: Discussed with the patient.  No family at bedside Disposition Plan: Management as outlined above.    LOS: 4 days   [facility] Pager [phone] 07/23/2017, 7:53 AM  If 7PM-7AM, please contact night-coverage at [email or web address], password [facility]

## 2017-07-24 ENCOUNTER — Inpatient Hospital Stay (HOSPITAL_COMMUNITY): Payer: Medicare Other

## 2017-07-24 DIAGNOSIS — J690 Pneumonitis due to inhalation of food and vomit: Secondary | ICD-10-CM

## 2017-07-24 DIAGNOSIS — R509 Fever, unspecified: Secondary | ICD-10-CM

## 2017-07-24 LAB — CBC
HCT: 29.8 % — ABNORMAL LOW (ref 36.0–46.0)
HCT: 28.8 % — ABNORMAL LOW (ref 36.0–46.0)
Hemoglobin: 9.6 g/dL — ABNORMAL LOW (ref 12.0–15.0)
Hemoglobin: 9 g/dL — ABNORMAL LOW (ref 12.0–15.0)
MCH: 28.7 pg (ref 26.0–34.0)
MCH: 27.7 pg (ref 26.0–34.0)
MCHC: 32.2 g/dL (ref 30.0–36.0)
MCHC: 31.3 g/dL (ref 30.0–36.0)
MCV: 89.2 fL (ref 78.0–100.0)
MCV: 88.6 fL (ref 78.0–100.0)
PLATELETS: 295 10*3/uL (ref 150–400)
PLATELETS: 263 10*3/uL (ref 150–400)
RBC: 3.34 MIL/uL — ABNORMAL LOW (ref 3.87–5.11)
RBC: 3.25 MIL/uL — AB (ref 3.87–5.11)
RDW: 15.6 % — AB (ref 11.5–15.5)
RDW: 15.3 % (ref 11.5–15.5)
WBC: 13.4 10*3/uL — ABNORMAL HIGH (ref 4.0–10.5)
WBC: 8.7 10*3/uL (ref 4.0–10.5)

## 2017-07-24 LAB — BASIC METABOLIC PANEL
Anion gap: 12 (ref 5–15)
BUN: 20 mg/dL (ref 6–20)
CALCIUM: 7.9 mg/dL — AB (ref 8.9–10.3)
CHLORIDE: 104 mmol/L (ref 101–111)
CO2: 31 mmol/L (ref 22–32)
CREATININE: 0.75 mg/dL (ref 0.44–1.00)
GFR calc Af Amer: >60 mL/min (ref >60)
Glucose, Bld: 85 mg/dL (ref 65–99)
Potassium: 3.3 mmol/L — ABNORMAL LOW (ref 3.5–5.1)
SODIUM: 147 mmol/L — AB (ref 135–145)

## 2017-07-24 LAB — MAGNESIUM: Magnesium: 2.2 mg/dL (ref 1.7–2.4)

## 2017-07-24 LAB — HEPARIN LEVEL (UNFRACTIONATED)
Heparin Unfractionated: 0.41 IU/mL (ref 0.30–0.70)
Heparin Unfractionated: 0.24 IU/mL — ABNORMAL LOW (ref 0.30–0.70)

## 2017-07-24 MED ORDER — SODIUM CHLORIDE 0.9 % IV SOLN
1.5000 g | Freq: Three times a day (TID) | INTRAVENOUS | Status: DC
  Administered 2017-07-24 – 2017-07-26 (×5): 1.5 g via INTRAVENOUS
  Filled 2017-07-24 (×6): qty 1.5

## 2017-07-24 MED ORDER — ENOXAPARIN SODIUM 30 MG/0.3ML ~~LOC~~ SOLN
30.0000 mg | SUBCUTANEOUS | Status: DC
  Administered 2017-07-25 – 2017-07-27 (×3): 30 mg via SUBCUTANEOUS
  Filled 2017-07-24 (×3): qty 0.3

## 2017-07-24 MED ORDER — SODIUM CHLORIDE 0.9 % IV SOLN
3.0000 g | Freq: Once | INTRAVENOUS | Status: AC
  Administered 2017-07-24: 3 g via INTRAVENOUS
  Filled 2017-07-24: qty 3

## 2017-07-24 MED ORDER — POTASSIUM CHLORIDE 10 MEQ/100ML IV SOLN
10.0000 meq | INTRAVENOUS | Status: AC
  Administered 2017-07-24 (×4): 10 meq via INTRAVENOUS
  Filled 2017-07-24 (×4): qty 100

## 2017-07-24 MED ORDER — PANTOPRAZOLE SODIUM 40 MG IV SOLR
40.0000 mg | Freq: Two times a day (BID) | INTRAVENOUS | Status: DC
  Administered 2017-07-24 – 2017-07-26 (×5): 40 mg via INTRAVENOUS
  Filled 2017-07-24 (×5): qty 40

## 2017-07-24 NOTE — Progress Notes (Signed)
Pharmacy Antibiotic Note  Elsey Holts is a 77 y.o. female admitted on 07/18/2017 with SBO/Afib w/ RVR, NSTEMI.  Pharmacy has been consulted for Unasyn dosing for aspiration PNA.  WBC 13.4. AF. 1/2 CXR: worsening bibasilar infiltrates , Tmax 100.7   Plan: Unasyn 3 gm IV x 1 then Unasyn 1.5 gm IV q8h  Height: 5\' 3"  (160 cm) Weight: 85 lb 15.7 oz (39 kg) IBW/kg (Calculated) : 52.4  Temp (24hrs), Avg:99.4 F (37.4 C), Min:98.1 F (36.7 C), Max:101.7 F (38.7 C)  Recent Labs  Lab 07/20/17 0416 07/21/17 0340 07/22/17 0313 07/23/17 0323 07/24/17 0342  WBC 13.4* 13.6* 11.0* 11.3* 13.4*  CREATININE 1.17* 1.01* 1.03* 1.19* 0.75    Estimated Creatinine Clearance: 36.8 mL/min (by C-G formula based on SCr of 0.75 mg/dL).    Allergies  Allergen Reactions  . Maxidex [Dexamethasone] Other (See Comments)    DEHYDRATION AND HEART RACING  . Advair Diskus [Fluticasone-Salmeterol] Other (See Comments)    No benefit with lungs (INEFFECTIVE)  . Remeron [Mirtazapine] Other (See Comments)    CAUSED NIGHTMARES  . Trazodone And Nefazodone Other (See Comments)    Heart pounding  . Tylox [Oxycodone-Acetaminophen] Itching    Antimicrobials this admission:  Unasyn 1/2>> Dose adjustments this admission:   Microbiology results:  12/31 MRSA PCR Neg  Thank you for allowing pharmacy to be a part of this patient's care.  Eudelia Bunch, Pharm.D. [phone] 07/24/2017 11:15 AM

## 2017-07-24 NOTE — Progress Notes (Signed)
[facility] PROGRESS NOTE  Courteny Egler CLE:[id number] DOB: 1940-11-26 DOA: 07/18/2017  PCP: [name], MD  Brief History/Interval Summary: 77 year old Caucasian female with a past medical history of COPD on home oxygen, chronic abdominal pain, history of multiple surgeries, multiple small bowel obstruction presented with increasing abdominal pain nausea and vomiting.  She was found to have small bowel obstruction.  She was hospitalized.  The hospital stay has been complicated by onset of atrial fibrillation with RVR.  Cardiology was consulted.  Patient had to be transferred to the stepdown unit for closer monitoring.  NG tube was reinserted due to recurrence of small bowel obstruction.  Patient had some angina during her stay in stepdown unit.  Reason for Visit: Atrial fibrillation with RVR.  Small bowel obstruction.  Consultants: General surgery.  Cardiology.  Procedures:  Transthoracic echocardiogram Study Conclusions  - Left ventricle: The cavity size was normal. Wall thickness was   normal. Systolic function was normal. The estimated ejection   fraction was in the range of 60% to 65%. Wall motion was normal;   there were no regional wall motion abnormalities. Doppler   parameters are consistent with abnormal left ventricular   relaxation (grade 1 diastolic dysfunction). The E/e&' ratio is   between 8-15, suggesting indeterminate LV fililng pressure. - Mitral valve: Calcified annulus. Mildly thickened leaflets .   There was trivial regurgitation. - Left atrium: The atrium was mildly dilated. - Atrial septum: There was increased thickness of the septum,   consistent with lipomatous hypertrophy. - Inferior vena cava: The vessel was normal in size. The   respirophasic diameter changes were in the normal range (>= 50%),   consistent with normal central venous pressure.  Impressions:  - Compared to a prior study in 2017, the LVEF is higher at 60-65%.   There  is now mild LAE.  Antibiotics: None  Subjective/Interval History: Patient states that her chest tightness has resolved.  Denies any shortness of breath.  Continues to have a cough.  Did have episode of fever overnight.  Denies any dizziness or lightheadedness.  Abdominal pain has improved.  Continues to pass gas from below.    ROS: Denies any headaches.  Objective:  Vital Signs  Vitals:   07/24/17 0303 07/24/17 0350 07/24/17 0400 07/24/17 0500  BP:   (!) 149/53 (!) 144/52  Pulse:  (!) 108 100 (!) 102  Resp:  20 (!) 22 (!) 26  Temp: (!) 101.7 F (38.7 C) (!) 100.7 F (38.2 C)  99.4 F (37.4 C)  TempSrc: Oral Oral  Oral  SpO2:  100% 99% 100%  Weight:      Height:        Intake/Output Summary (Last 24 hours) at 07/24/2017 0742 Last data filed at 07/24/2017 0500 Gross per 24 hour  Intake 1242.34 ml  Output 825 ml  Net 417.34 ml   Filed Weights   07/18/17 2020 07/19/17 0251 07/23/17 0346  Weight: 39.9 kg (88 lb) 38.3 kg (84 lb 7 oz) 39 kg (85 lb 15.7 oz)    General appearance: Awake alert.  In no distress Resp: Noted to have crackles at the bases.  No wheezing.  Normal effort at rest.   Cardio: S1-S2 tachycardic regular.  No S3-S4.  Systolic murmur appreciated over the precordium.   GI: Abdomen is soft.  Nontender.  No masses or organomegaly.  NG tube noted to have brown colored fluid. Extremities: No Edema Neurologic: No focal neurological deficits..   Lab Results:  Data  Reviewed: I have personally reviewed following labs and imaging studies  CBC: Recent Labs  Lab 07/18/17 2033 07/20/17 0416 07/21/17 0340 07/22/17 0313 07/23/17 0323 07/24/17 0342  WBC 10.3 13.4* 13.6* 11.0* 11.3* 13.4*  NEUTROABS 7.6  --   --   --   --   --   HGB 11.0* 10.4* 9.3* 9.6* 8.3* 9.6*  HCT 34.8* 32.8* 30.3* 30.4* 26.3* 29.8*  MCV 86.6 88.6 89.9 87.6 89.8 89.2  PLT 380 410* 329 303 288 756    Basic Metabolic Panel: Recent Labs  Lab 07/20/17 0416 07/21/17 0340 07/22/17 0313  07/23/17 0323 07/24/17 0342  NA 142 147* 141 145 147*  K 3.6 3.8 3.1* 3.2* 3.3*  CL 95* 106 100* 98* 104  CO2 35* 28 33* 38* 31  GLUCOSE 95 92 177* 112* 85  BUN 26* 26* 21* 32* 20  CREATININE 1.17* 1.01* 1.03* 1.19* 0.75  CALCIUM 8.8* 7.8* 8.1* 7.9* 7.9*    GFR: Estimated Creatinine Clearance: 36.8 mL/min (by C-G formula based on SCr of 0.75 mg/dL).  Liver Function Tests: Recent Labs  Lab 07/18/17 2040  AST 28  ALT 15  ALKPHOS 74  BILITOT 0.4  PROT 7.3  ALBUMIN 4.1    Cardiac Enzymes: Recent Labs  Lab 07/21/17 1436 07/22/17 0313 07/23/17 0919 07/23/17 1453 07/23/17 2026  TROPONINI 11.33* 6.31* 2.57* 1.98* 1.95*     Recent Results (from the past 240 hour(s))  MRSA PCR Screening     Status: None   Collection Time: 07/22/17  6:34 AM  Result Value Ref Range Status   MRSA by PCR NEGATIVE NEGATIVE Final    Comment:        The GeneXpert MRSA Assay (FDA approved for NASAL specimens only), is one component of a comprehensive MRSA colonization surveillance program. It is not intended to diagnose MRSA infection nor to guide or monitor treatment for MRSA infections.       Radiology Studies: Dg Abd 2 Views  Result Date: 07/23/2017 CLINICAL DATA:  77 year old female with a history of small bowel obstruction EXAM: ABDOMEN - 2 VIEW COMPARISON:  07/22/2017 FINDINGS: Compare to the prior plain film enteric contrast has traversed the colon, reaching the rectum. No abnormally distended colon. There has been resolution of distended small bowel loops within the mid abdomen. No persisting dilated colon or bowel loops. Surgical changes of cholecystectomy. Gastric tube again terminates with the port above the GE junction. No air-fluid levels identified on the decubitus image. Osteopenia and degenerative changes. IMPRESSION: Evidence of resolving ileus/bowel obstruction, with resolution of dilated small bowel loops and enteric contrast having traversed the colon to the rectum. The  gastric tube again terminates with the side port above the GE junction, and may be advanced for better positioning. Electronically Signed   By: Corrie Mckusick D.O.   On: 07/23/2017 10:04     Medications:  Scheduled: . digoxin  0.25 mg Intravenous Once  . fluticasone furoate-vilanterol  1 puff Inhalation Daily  . levalbuterol  0.63 mg Nebulization TID  . mouth rinse  15 mL Mouth Rinse BID  . metoprolol tartrate  5 mg Intravenous Q6H  . pantoprazole (PROTONIX) IV  40 mg Intravenous Q12H  . polyethylene glycol  17 g Oral Daily  . trimethoprim-polymyxin b  1 drop Both Eyes Q6H  . umeclidinium bromide  1 puff Inhalation Daily   Continuous: . sodium chloride 50 mL/hr at 07/23/17 1952  . diltiazem (CARDIZEM) infusion 2.5 mg/hr (07/23/17 2351)  . heparin 1,000 Units/hr (  07/23/17 2350)  . potassium chloride     WRU:[id number] **OR** acetaminophen, hydrALAZINE, HYDROmorphone (DILAUDID) injection, levalbuterol, lidocaine, nitroGLYCERIN, [DISCONTINUED] ondansetron **OR** ondansetron (ZOFRAN) IV, phenol, promethazine  Assessment/Plan:  Principal Problem:   SBO (small bowel obstruction) (HCC) Active Problems:   COPD (chronic obstructive pulmonary disease) (HCC)   Chronic hypoxemic respiratory failure (HCC)   Essential hypertension   Persistent atrial fibrillation ([place])   Encounter for nasogastric (NG) tube placement    Recurrent small bowel obstruction Patient was hospitalized with same.  NG tube was placed on admission.  This was removed initially on 12/30. However over the course of the night on 12/30 patient developed nausea vomiting.  NG tube had to be placed back.  General surgery continues to follow.  Management deferred to them.  Brown colored fluid noted in the NG tube.  Hemoglobin is stable.  Patient is on IV heparin so if she has frank blood then we may have to stop her anticoagulation.  Change Pepcid to PPI.  Recheck hemoglobin later today.  Atrial fibrillation with RVR The  patient had another episode of rapid ventricular response on the night of 12/30.  She was placed back on intravenous Cardizem.  Converted to sinus rhythm.  Remains on low-dose Cardizem.  Cardiology continues to follow.  Echocardiogram as above.  Shows normal systolic function.  Grade 1 diastolic dysfunction.  No wall motion abnormalities noted.  TSH normal.  Most likely her atrial fibrillation is due to her acute illness.  Cardiology recommends low-dose anticoagulation when ready to take orally.  Currently on IV heparin.  Elevated troponin/non-ST elevation MI/mild pulmonary edema Patient with significantly elevated troponin level.  Seen by cardiology and not thought to be a good candidate for interventional or invasive procedures.  Experience some angina on 1/1.  EKG did not show any new changes.  Troponin continues on downward trend.  Patient started on nitroglycerin by cardiology.  Remains on beta-blocker.  Crackles in the lung could be more suggestive of aspiration rather than fluid overload.    Fever/possible aspiration pneumonia Patient does mention a cough.  Few crackles are noted in the lungs.  Chest x-ray repeated and shows bibasilar infiltrates.  In view of the fever and a cough and the fact that she had vomiting couple of days ago, this is most likely aspiration.  WBC is also higher.  We will place the patient on Unasyn for now.  Chronic hypoxemic respiratory failure secondary to COPD Uses oxygen at home at baseline.    Acute kidney injury/Hypokalemia Creatinine was noted to be higher yesterday but normal today.  Hold off on further doses of Lasix.  Monitor renal function closely.  Replace potassium.  Monitor urine output.  History of COPD Without acute exacerbation.  Stable.  Continue to monitor.  History of essential hypertension Monitor blood pressures closely.  She is on multiple blood pressure lowering agents at home.  Metoprolol intravenously.  Normocytic anemia Probably anemia  of chronic disease. Continue to monitor.  Continue to monitor for GI bleed considering presence of brown colored fluid in the NG tube.  DVT Prophylaxis: On IV heparin Code Status: Full code Family Communication: Discussed with the patient.  No family at bedside Disposition Plan: Management as outlined above.  PT evaluation.    LOS: 5 days   [facility] Pager [phone] 07/24/2017, 7:42 AM  If 7PM-7AM, please contact night-coverage at [email or web address], password [facility]

## 2017-07-24 NOTE — Progress Notes (Signed)
[place] for heparin Indication: chest pain/ACS  Allergies  Allergen Reactions  . Maxidex [Dexamethasone] Other (See Comments)    DEHYDRATION AND HEART RACING  . Advair Diskus [Fluticasone-Salmeterol] Other (See Comments)    No benefit with lungs (INEFFECTIVE)  . Remeron [Mirtazapine] Other (See Comments)    CAUSED NIGHTMARES  . Trazodone And Nefazodone Other (See Comments)    Heart pounding  . Tylox [Oxycodone-Acetaminophen] Itching    Patient Measurements: Height: 5\' 3"  (160 cm) Weight: 85 lb 15.7 oz (39 kg) IBW/kg (Calculated) : 52.4 Heparin Dosing Weight: total body weight   Vital Signs: Temp: 100.7 F (38.2 C) (01/02 0350) Temp Source: Oral (01/02 0350) BP: 143/52 (01/02 0300) Pulse Rate: 108 (01/02 0350)  Labs: Recent Labs    07/22/17 0313  07/23/17 0323 07/23/17 0726 07/23/17 0919 07/23/17 1453 07/23/17 1720 07/23/17 2026 07/24/17 0342  HGB 9.6*  --  8.3*  --   --   --   --   --  9.6*  HCT 30.4*  --  26.3*  --   --   --   --   --  29.8*  PLT 303  --  288  --   --   --   --   --  295  HEPARINUNFRC 0.20*   < >  --  0.26*  --   --  0.23*  --  0.41  CREATININE 1.03*  --  1.19*  --   --   --   --   --  0.75  TROPONINI 6.31*  --   --   --  2.57* 1.98*  --  1.95*  --    < > = values in this interval not displayed.    Estimated Creatinine Clearance: 36.8 mL/min (by C-G formula based on SCr of 0.75 mg/dL).  Assessment: 25 yoF admitted with recurrent SBO , now having bowel function with conservative management.  Topronins significantly elevated, starting heparin infusion for NSTEMI.    07/24/2017  0342 HL=0.41 (therapeutic) with heparin infusing @ 1000 units/hr.  No infusion or bleeding issues noted. Hg 9.6, PLTC remains WNL.   Goal of Therapy:  Heparin level 0.3-0.7 units/ml Monitor platelets by anticoagulation protocol: Yes   Plan:  Continue heparin infusion to 1000 units/hr. Check heparin level in 8 hr to confirm  therapeutic dose Daily CBC and heparin level while on heparin infusion.  Leone Haven, PharmD 07/24/2017 4:34 AM

## 2017-07-24 NOTE — Progress Notes (Signed)
Progress Note  Patient Name: Tamara Adams Date of Encounter: 07/24/2017  Primary Cardiologist: Dr. Acie Fredrickson, new (was Dr. Stanford Breed)  Subjective   Pt doing better this AM. Chest tightness has subsided. She reports that she feels like she is getting a cold. Ran a low grade fever this AM.   Inpatient Medications    Scheduled Meds: . digoxin  0.25 mg Intravenous Once  . fluticasone furoate-vilanterol  1 puff Inhalation Daily  . levalbuterol  0.63 mg Nebulization TID  . mouth rinse  15 mL Mouth Rinse BID  . metoprolol tartrate  5 mg Intravenous Q6H  . pantoprazole (PROTONIX) IV  40 mg Intravenous Q12H  . polyethylene glycol  17 g Oral Daily  . trimethoprim-polymyxin b  1 drop Both Eyes Q6H  . umeclidinium bromide  1 puff Inhalation Daily   Continuous Infusions: . sodium chloride 50 mL/hr at 07/23/17 1952  . diltiazem (CARDIZEM) infusion 2.5 mg/hr (07/23/17 2351)  . heparin 1,000 Units/hr (07/23/17 2350)  . potassium chloride 10 mEq (07/24/17 0901)   PRN Meds: acetaminophen **OR** acetaminophen, hydrALAZINE, HYDROmorphone (DILAUDID) injection, levalbuterol, lidocaine, nitroGLYCERIN, [DISCONTINUED] ondansetron **OR** ondansetron (ZOFRAN) IV, phenol, promethazine   Vital Signs    Vitals:   07/24/17 0400 07/24/17 0500 07/24/17 0730 07/24/17 0825  BP: (!) 149/53 (!) 144/52    Pulse: 100 (!) 102    Resp: (!) 22 (!) 26    Temp:  99.4 F (37.4 C) 99.8 F (37.7 C)   TempSrc:  Oral Oral   SpO2: 99% 100%  100%  Weight:      Height:        Intake/Output Summary (Last 24 hours) at 07/24/2017 0956 Last data filed at 07/24/2017 0900 Gross per 24 hour  Intake 1242.34 ml  Output 1025 ml  Net 217.34 ml   Filed Weights   07/18/17 2020 07/19/17 0251 07/23/17 0346  Weight: 88 lb (39.9 kg) 84 lb 7 oz (38.3 kg) 85 lb 15.7 oz (39 kg)    Physical Exam   General: Frail, elderly, NAD Skin: Warm, dry, intact  Head: Normocephalic, atraumatic, clear, moist mucus membranes. Neck:  Negative for carotid bruits. No JVD Lungs: Mild crackles bilaterally. CTA anteriorly. Breathing is unlabored. Cardiovascular: RRR with S1 S2. No murmurs, rubs, or gallops Abdomen: Soft, non-tender, non-distended with hypoactive  bowel sounds. No obvious abdominal masses. MSK: Strength and tone appear normal for age. 5/5 in all extremities Extremities: No edema. No clubbing or cyanosis. DP/PT pulses 2+ bilaterally Neuro: Alert and oriented. No focal deficits. No facial asymmetry. MAE spontaneously. Psych: Responds to questions appropriately with normal affect.    Labs    Chemistry Recent Labs  Lab 07/18/17 2040  07/22/17 0313 07/23/17 0323 07/24/17 0342  NA 135   < > 141 145 147*  K 4.3   < > 3.1* 3.2* 3.3*  CL 93*   < > 100* 98* 104  CO2 31   < > 33* 38* 31  GLUCOSE 171*   < > 177* 112* 85  BUN 9   < > 21* 32* 20  CREATININE 0.77   < > 1.03* 1.19* 0.75  CALCIUM 9.3   < > 8.1* 7.9* 7.9*  PROT 7.3  --   --   --   --   ALBUMIN 4.1  --   --   --   --   AST 28  --   --   --   --   ALT 15  --   --   --   --  ALKPHOS 74  --   --   --   --   BILITOT 0.4  --   --   --   --   GFRNONAA >60   < > 51* 43* >60  GFRAA >60   < > 60* 50* >60  ANIONGAP 11   < > 8 9 12    < > = values in this interval not displayed.     Hematology Recent Labs  Lab 07/22/17 0313 07/23/17 0323 07/24/17 0342  WBC 11.0* 11.3* 13.4*  RBC 3.47* 2.93* 3.34*  HGB 9.6* 8.3* 9.6*  HCT 30.4* 26.3* 29.8*  MCV 87.6 89.8 89.2  MCH 27.7 28.3 28.7  MCHC 31.6 31.6 32.2  RDW 15.3 15.5 15.6*  PLT 303 288 295    Cardiac Enzymes Recent Labs  Lab 07/22/17 0313 07/23/17 0919 07/23/17 1453 07/23/17 2026  TROPONINI 6.31* 2.57* 1.98* 1.95*   No results for input(s): TROPIPOC in the last 168 hours.   BNPNo results for input(s): BNP, PROBNP in the last 168 hours.   DDimer No results for input(s): DDIMER in the last 168 hours.   Radiology    Dg Chest Port 1 View  Result Date: 07/24/2017 CLINICAL DATA:   Nonproductive cough for 2 days. COPD, HTN, hx of pneumonia, EXAM: PORTABLE CHEST - 1 VIEW COMPARISON:  07/22/2017 and prior studies FINDINGS: Some progression of bibasilar patchy airspace interstitial opacities. Probable small pleural effusions. Stable left apical opacity corresponding to the nodule seen better on prior CT. Heart size and mediastinal contours are within normal limits. Atheromatous aorta. No pneumothorax. Visualized bones unremarkable. IMPRESSION: 1. Worsening bibasilar infiltrates or edema, with small effusions. Electronically Signed   By: Lucrezia Europe M.D.   On: 07/24/2017 08:10   Dg Abd 2 Views  Result Date: 07/23/2017 CLINICAL DATA:  77 year old female with a history of small bowel obstruction EXAM: ABDOMEN - 2 VIEW COMPARISON:  07/22/2017 FINDINGS: Compare to the prior plain film enteric contrast has traversed the colon, reaching the rectum. No abnormally distended colon. There has been resolution of distended small bowel loops within the mid abdomen. No persisting dilated colon or bowel loops. Surgical changes of cholecystectomy. Gastric tube again terminates with the port above the GE junction. No air-fluid levels identified on the decubitus image. Osteopenia and degenerative changes. IMPRESSION: Evidence of resolving ileus/bowel obstruction, with resolution of dilated small bowel loops and enteric contrast having traversed the colon to the rectum. The gastric tube again terminates with the side port above the GE junction, and may be advanced for better positioning. Electronically Signed   By: Corrie Mckusick D.O.   On: 07/23/2017 10:04    Telemetry    07/24/17 SR/ST 109 - Personally Reviewed  ECG    07/23/17- NSR 96 with ST depressions in leads II, III, aVF, V4-V5- Personally Reviewed  Cardiac Studies   Echo 07/21/17: Study Conclusions  - Left ventricle: The cavity size was normal. Wall thickness was   normal. Systolic function was normal. The estimated ejection   fraction  was in the range of 60% to 65%. Wall motion was normal;   there were no regional wall motion abnormalities. Doppler   parameters are consistent with abnormal left ventricular   relaxation (grade 1 diastolic dysfunction). The E/e&' ratio is   between 8-15, suggesting indeterminate LV fililng pressure. - Mitral valve: Calcified annulus. Mildly thickened leaflets .   There was trivial regurgitation. - Left atrium: The atrium was mildly dilated. - Atrial septum: There was increased thickness  of the septum,   consistent with lipomatous hypertrophy. - Inferior vena cava: The vessel was normal in size. The   respirophasic diameter changes were in the normal range (>= 50%),   consistent with normal central venous pressure.  Impressions:  - Compared to a prior study in 2017, the LVEF is higher at 60-65%.   There is now mild LAE.  Patient Profile     77 y.o. female with a hx of severe COPD on 6L home O2, hypertension, palpitations and recurrent SBO's admitted with recurrent SBO who is being followed by cardiology for non-ST segment elevation myocardial infarction and new onset atrial fibrillation at the request of Dr. Maylene Roes.  Assessment & Plan    1. Elevated troponin level/non ST elevation MI :  -Denies chest pain/chest tightness -Troponin levels trending down, 11.33>6.31>2.57>1.98>1.95 -Per MD, she is a poor candidate for invasive PCI or surgical procedure given her severe COPD.  -NTG paste added 07/23/17, consider IMDUR and SL NTG at discharge -Metoprolol increased to 5mg  IV Q6H yesterday. May consider changing to PO once she is tolerating diet tomorrow  -On BB -Will need to add ASA if ok with primary team  -Will check lipid panel and add statin tomorrow if tolerating PO well   2. Paroxysmal Atrial Fibrillation: -Pt has converted to NSR, however remains tachycardic, 109 -Pt was running low grade temp this AM, could be contributing? -Continue low dose diltiazem gtt and IV metoprolol and  transisiotn as stated above  -Consider transitioning to Toprol XL. She is on home dose of 50 mg QD  -CHADS2Vasc score= 4 ( age 62, CAD, HTN, )   3. Small bowel obstruction: -Per surgery/IM -NG tube out today -Advancing diet as tolerated, clear liquids -+flatulence and feels that bowels are waking up  Signed, Kathyrn Drown, NP  07/24/2017, 9:56 AM    For questions or updates, please contact   Please consult [email or web address] for contact info under Cardiology/STEMI.  Attending Note:   The patient was seen and examined.  Agree with assessment and plan as noted above.  Changes made to the above note as needed.  Patient seen and independently examined with Kathyrn Drown, NP .   We discussed all aspects of the encounter. I agree with the assessment and plan as stated above.  1.  Atrial fibrillation: She is converted to sinus rhythm.  I suspect that the atrial ablation was due to her acute illness.  Continue current medications.  Will change to p.o. medications when she is eating and drinking.  2.  Non-ST segment elevation myocardial infarction..  She presented with rapid atrial fibrillation as well as small bowel obstruction.  I think that the stress of these caused a demand non-ST segment elevation myocardial infarction.  She has normal left ventricular function on echo.  She is a relatively poor candidate for invasive or interventional cardiac procedures.  We will continue with medical therapy.  She is feeling quite a bit better on the nitro paste.  We will change her to isosorbide mononitrate when she is taking p.o.   I have spent a total of 40 minutes with patient reviewing hospital  notes , telemetry, EKGs, labs and examining patient as well as establishing an assessment and plan that was discussed with the patient. > 50% of time was spent in direct patient care.    Thayer Headings, Brooke Bonito., MD, [facility] 07/24/2017, 11:22 AM [address]  [place] Pager [phone]

## 2017-07-24 NOTE — Progress Notes (Signed)
General Surgery [facility]  Assessment & Plan: Small bowel obstruction, likely secondary to adhesions  Passing flatus, denies pain  AXR with contrast in distal colon  Will discontinue NG tube and try clear liquids today  OOB, up to chair  Will follow with you        Earnstine Regal, MD, [facility]       Office: [phone]    Chief Complaint: Small bowel obstruction, MI   Subjective: Patient in bed, respiratory therapy giving treatment.  Denies abd pain.  Passing flatus, no BM's but wants to try on commode.  Objective: Vital signs in last 24 hours: Temp:  [98.1 F (36.7 C)-101.7 F (38.7 C)] 99.8 F (37.7 C) (01/02 0730) Pulse Rate:  [88-115] 102 (01/02 0500) Resp:  [17-27] 26 (01/02 0500) BP: (138-175)/(44-75) 144/52 (01/02 0500) SpO2:  [92 %-100 %] 100 % (01/02 0825) Last BM Date: 07/20/17  Intake/Output from previous day: 01/01 0701 - 01/02 0700 In: 1242.3 [I.V.:1192.3; IV Piggyback:50] Out: 440 [Urine:300; Emesis/NG output:525] Intake/Output this shift: No intake/output data recorded.  Physical Exam: HEENT - sclerae clear, mucous membranes moist Neck - soft Chest - clear bilaterally Cor - mild tachycardia Abdomen - soft, non-tender; BS present Ext - no edema, non-tender Neuro - alert & oriented, no focal deficits  Lab Results:  Recent Labs    07/23/17 0323 07/24/17 0342  WBC 11.3* 13.4*  HGB 8.3* 9.6*  HCT 26.3* 29.8*  PLT 288 295   BMET Recent Labs    07/23/17 0323 07/24/17 0342  NA 145 147*  K 3.2* 3.3*  CL 98* 104  CO2 38* 31  GLUCOSE 112* 85  BUN 32* 20  CREATININE 1.19* 0.75  CALCIUM 7.9* 7.9*   PT/INR No results for input(s): LABPROT, INR in the last 72 hours. Comprehensive Metabolic Panel:    Component Value Date/Time   NA 147 (H) 07/24/2017 0342   NA 145 07/23/2017 0323   K 3.3 (L) 07/24/2017 0342   K 3.2 (L) 07/23/2017 0323   CL 104 07/24/2017 0342   CL 98 (L)  07/23/2017 0323   CO2 31 07/24/2017 0342   CO2 38 (H) 07/23/2017 0323   BUN 20 07/24/2017 0342   BUN 32 (H) 07/23/2017 0323   CREATININE 0.75 07/24/2017 0342   CREATININE 1.19 (H) 07/23/2017 0323   CREATININE 0.75 01/14/2017 1336   CREATININE 0.58 (L) 04/20/2016 1442   GLUCOSE 85 07/24/2017 0342   GLUCOSE 112 (H) 07/23/2017 0323   CALCIUM 7.9 (L) 07/24/2017 0342   CALCIUM 7.9 (L) 07/23/2017 0323   AST 28 07/18/2017 2040   AST 23 01/14/2017 1336   ALT 15 07/18/2017 2040   ALT 10 01/14/2017 1336   ALKPHOS 74 07/18/2017 2040   ALKPHOS 79 01/14/2017 1336   BILITOT 0.4 07/18/2017 2040   BILITOT 0.3 01/14/2017 1336   PROT 7.3 07/18/2017 2040   PROT 6.7 01/14/2017 1336   ALBUMIN 4.1 07/18/2017 2040   ALBUMIN 4.2 01/14/2017 1336    Studies/Results: Dg Chest Port 1 View  Result Date: 07/24/2017 CLINICAL DATA:  Nonproductive cough for 2 days. COPD, HTN, hx of pneumonia, EXAM: PORTABLE CHEST - 1 VIEW COMPARISON:  07/22/2017 and prior studies FINDINGS: Some progression of bibasilar patchy airspace interstitial opacities. Probable small pleural effusions. Stable left apical opacity corresponding to the nodule seen better on prior CT. Heart size and mediastinal contours are within normal limits. Atheromatous aorta. No pneumothorax.  Visualized bones unremarkable. IMPRESSION: 1. Worsening bibasilar infiltrates or edema, with small effusions. Electronically Signed   By: Lucrezia Europe M.D.   On: 07/24/2017 08:10   Dg Abd 2 Views  Result Date: 07/23/2017 CLINICAL DATA:  77 year old female with a history of small bowel obstruction EXAM: ABDOMEN - 2 VIEW COMPARISON:  07/22/2017 FINDINGS: Compare to the prior plain film enteric contrast has traversed the colon, reaching the rectum. No abnormally distended colon. There has been resolution of distended small bowel loops within the mid abdomen. No persisting dilated colon or bowel loops. Surgical changes of cholecystectomy. Gastric tube again terminates with the  port above the GE junction. No air-fluid levels identified on the decubitus image. Osteopenia and degenerative changes. IMPRESSION: Evidence of resolving ileus/bowel obstruction, with resolution of dilated small bowel loops and enteric contrast having traversed the colon to the rectum. The gastric tube again terminates with the side port above the GE junction, and may be advanced for better positioning. Electronically Signed   By: Corrie Mckusick D.O.   On: 07/23/2017 10:04      Markella Dao M 07/24/2017  Patient ID: Isa Rankin, female   DOB: 1940-10-02, 77 y.o.   MRN: [id number]

## 2017-07-25 ENCOUNTER — Telehealth: Payer: Self-pay | Admitting: *Deleted

## 2017-07-25 ENCOUNTER — Other Ambulatory Visit: Payer: Self-pay | Admitting: Family Medicine

## 2017-07-25 DIAGNOSIS — J9611 Chronic respiratory failure with hypoxia: Secondary | ICD-10-CM

## 2017-07-25 DIAGNOSIS — E876 Hypokalemia: Secondary | ICD-10-CM

## 2017-07-25 LAB — BASIC METABOLIC PANEL
Anion gap: 6 (ref 5–15)
BUN: 14 mg/dL (ref 6–20)
CALCIUM: 7.7 mg/dL — AB (ref 8.9–10.3)
CO2: 31 mmol/L (ref 22–32)
CREATININE: 0.67 mg/dL (ref 0.44–1.00)
Chloride: 101 mmol/L (ref 101–111)
GFR calc Af Amer: >60 mL/min (ref >60)
GFR calc non Af Amer: >60 mL/min (ref >60)
GLUCOSE: 110 mg/dL — AB (ref 65–99)
Potassium: 3.1 mmol/L — ABNORMAL LOW (ref 3.5–5.1)
Sodium: 138 mmol/L (ref 135–145)

## 2017-07-25 LAB — LIPID PANEL
Cholesterol: 118 mg/dL (ref 0–200)
HDL: 41 mg/dL (ref >40)
LDL CALC: 51 mg/dL (ref 0–99)
TRIGLYCERIDES: 130 mg/dL (ref <150)
Total CHOL/HDL Ratio: 2.9 RATIO
VLDL: 26 mg/dL (ref 0–40)

## 2017-07-25 LAB — HEPARIN LEVEL (UNFRACTIONATED): Heparin Unfractionated: <0.1 IU/mL — ABNORMAL LOW (ref 0.30–0.70)

## 2017-07-25 MED ORDER — METOPROLOL TARTRATE 25 MG PO TABS
25.0000 mg | ORAL_TABLET | Freq: Two times a day (BID) | ORAL | Status: DC
  Administered 2017-07-25 (×2): 25 mg via ORAL
  Filled 2017-07-25 (×2): qty 1

## 2017-07-25 MED ORDER — BISACODYL 10 MG RE SUPP: 10.0000 mg | Freq: Every day | RECTAL | Status: DC | PRN

## 2017-07-25 MED ORDER — DRONABINOL 2.5 MG PO CAPS: 2.5000 mg | ORAL_CAPSULE | Freq: Two times a day (BID) | ORAL | 1 refills | Status: DC

## 2017-07-25 MED ORDER — POTASSIUM CHLORIDE 10 MEQ/100ML IV SOLN
10.0000 meq | INTRAVENOUS | Status: AC
  Administered 2017-07-25 (×4): 10 meq via INTRAVENOUS
  Filled 2017-07-25: qty 100

## 2017-07-25 NOTE — Progress Notes (Signed)
Progress Note  Patient Name: Tamara Adams Date of Encounter: 07/25/2017  Primary Cardiologist: new to [place]   Subjective   77 year old female with a history of see her severe COPD, hypertension, recurrent small bowel obstructions.  She presented with another small bowel obstruction in rapid atrial fibrillation.  She had a non-ST segment elevation myocardial infarction as a complication related to those issues.  She was treated with IV metoprolol and diltiazem and is converted back to normal sinus rhythm.  She is feeling quite a bit better.  Her echocardiogram reveals normal left ventricular systolic function.  She is starting to eat clear liquids.  Inpatient Medications    Scheduled Meds: . digoxin  0.25 mg Intravenous Once  . enoxaparin (LOVENOX) injection  30 mg Subcutaneous Q24H  . fluticasone furoate-vilanterol  1 puff Inhalation Daily  . levalbuterol  0.63 mg Nebulization TID  . mouth rinse  15 mL Mouth Rinse BID  . metoprolol tartrate  5 mg Intravenous Q6H  . pantoprazole (PROTONIX) IV  40 mg Intravenous Q12H  . polyethylene glycol  17 g Oral Daily  . trimethoprim-polymyxin b  1 drop Both Eyes Q6H  . umeclidinium bromide  1 puff Inhalation Daily   Continuous Infusions: . sodium chloride 10 mL/hr at 07/25/17 0700  . ampicillin-sulbactam (UNASYN) IV Stopped (07/25/17 0510)  . diltiazem (CARDIZEM) infusion 2.5 mg/hr (07/23/17 2351)  . potassium chloride     PRN Meds: acetaminophen **OR** acetaminophen, bisacodyl, hydrALAZINE, HYDROmorphone (DILAUDID) injection, levalbuterol, lidocaine, nitroGLYCERIN, [DISCONTINUED] ondansetron **OR** ondansetron (ZOFRAN) IV, phenol, promethazine   Vital Signs    Vitals:   07/25/17 0400 07/25/17 0500 07/25/17 0600 07/25/17 0800  BP: (!) 107/43 (!) 115/40 (!) 117/43   Pulse: 75 77 79   Resp: 18 16 17    Temp:    98.3 F (36.8 C)  TempSrc:    Oral  SpO2: 100% 100% 99%   Weight:      Height:        Intake/Output Summary (Last 24  hours) at 07/25/2017 0959 Last data filed at 07/25/2017 0600 Gross per 24 hour  Intake 1140.5 ml  Output 1025 ml  Net 115.5 ml   Filed Weights   07/18/17 2020 07/19/17 0251 07/23/17 0346  Weight: 88 lb (39.9 kg) 84 lb 7 oz (38.3 kg) 85 lb 15.7 oz (39 kg)    Telemetry     NSR - Personally Reviewed  ECG     NSR  - Personally Reviewed  Physical Exam   GEN:  Elderly, frail female, no acute distress. Neck: No JVD Cardiac: RRR, soft systolic murmur Respiratory: Clear to auscultation bilaterally. GI: Soft, nontender, non-distended  MS: No edema; No deformity. Neuro:  Nonfocal  Psych: Normal affect   Labs    Chemistry Recent Labs  Lab 07/18/17 2040  07/23/17 0323 07/24/17 0342 07/25/17 0613  NA 135   < > 145 147* 138  K 4.3   < > 3.2* 3.3* 3.1*  CL 93*   < > 98* 104 101  CO2 31   < > 38* 31 31  GLUCOSE 171*   < > 112* 85 110*  BUN 9   < > 32* 20 14  CREATININE 0.77   < > 1.19* 0.75 0.67  CALCIUM 9.3   < > 7.9* 7.9* 7.7*  PROT 7.3  --   --   --   --   ALBUMIN 4.1  --   --   --   --   AST 28  --   --   --   --  ALT 15  --   --   --   --   ALKPHOS 74  --   --   --   --   BILITOT 0.4  --   --   --   --   GFRNONAA >60   < > 43* >60 >60  GFRAA >60   < > 50* >60 >60  ANIONGAP 11   < > 9 12 6    < > = values in this interval not displayed.     Hematology Recent Labs  Lab 07/23/17 0323 07/24/17 0342 07/24/17 1405  WBC 11.3* 13.4* 8.7  RBC 2.93* 3.34* 3.25*  HGB 8.3* 9.6* 9.0*  HCT 26.3* 29.8* 28.8*  MCV 89.8 89.2 88.6  MCH 28.3 28.7 27.7  MCHC 31.6 32.2 31.3  RDW 15.5 15.6* 15.3  PLT 288 295 263    Cardiac Enzymes Recent Labs  Lab 07/22/17 0313 07/23/17 0919 07/23/17 1453 07/23/17 2026  TROPONINI 6.31* 2.57* 1.98* 1.95*   No results for input(s): TROPIPOC in the last 168 hours.   BNPNo results for input(s): BNP, PROBNP in the last 168 hours.   DDimer No results for input(s): DDIMER in the last 168 hours.   Radiology    Dg Chest Port 1  View  Result Date: 07/24/2017 CLINICAL DATA:  Nonproductive cough for 2 days. COPD, HTN, hx of pneumonia, EXAM: PORTABLE CHEST - 1 VIEW COMPARISON:  07/22/2017 and prior studies FINDINGS: Some progression of bibasilar patchy airspace interstitial opacities. Probable small pleural effusions. Stable left apical opacity corresponding to the nodule seen better on prior CT. Heart size and mediastinal contours are within normal limits. Atheromatous aorta. No pneumothorax. Visualized bones unremarkable. IMPRESSION: 1. Worsening bibasilar infiltrates or edema, with small effusions. Electronically Signed   By: Lucrezia Europe M.D.   On: 07/24/2017 08:10    Cardiac Studies    Patient Profile     77 y.o. female with severe COPD and recurrent small bowel obstructions.  She is admitted with a small bowel obstruction in rapid atrial fibrillation. Assessment & Plan    1.  Coronary artery disease: The patient is status post NSTEMI which occurred in the setting of a small bowel obstruction in rapid atrial fibrillation.  She is done well on medical management.    Treating her with IV metoprolol as well as IV diltiazem.  She is not having any current episodes of chest discomfort.  Will change the metoprolol to 25 mg p.o. twice daily.  We will discontinue the IV metoprolol and IV diltiazem.  .  Paroxysmal atrial for ablation: She has converted back to normal sinus rhythm.  For questions or updates, please contact [place] Please consult [email or web address] for contact info under Cardiology/STEMI.      Signed, Mertie Moores, MD  07/25/2017, 9:59 AM

## 2017-07-25 NOTE — Telephone Encounter (Signed)
Medication sent.

## 2017-07-25 NOTE — Progress Notes (Signed)
General Surgery [facility]  Assessment & Plan:  Small bowel obstruction, likely secondary to adhesions             Passing flatus, denies pain, denies nausea or emesis overnight             Tolerating clear liquids - advance to full liquids today               Dulcolax supp prn             OOB, up to chair             Will follow with you  Please correct electrolyte abnormalities as this may prolong ileus.        Earnstine Regal, MD, [facility]       Office: [phone]    Chief Complaint: Small bowel obstruction  Subjective: Patient in bed, comfortable.  Passing flatus, no BM but feels it is coming.  Would like to advance diet.  No nausea or emesis.  Objective: Vital signs in last 24 hours: Temp:  [98.1 F (36.7 C)-99.2 F (37.3 C)] 98.3 F (36.8 C) (01/03 0000) Pulse Rate:  [73-111] 79 (01/03 0600) Resp:  [16-27] 17 (01/03 0600) BP: (104-169)/(38-76) 117/43 (01/03 0600) SpO2:  [95 %-100 %] 99 % (01/03 0600) Last BM Date: 07/18/17  Intake/Output from previous day: 01/02 0701 - 01/03 0700 In: 1140.5 [I.V.:640.5; IV Piggyback:500] Out: 1225 [Urine:1025; Emesis/NG output:200] Intake/Output this shift: No intake/output data recorded.  Physical Exam: HEENT - sclerae clear, mucous membranes moist Neck - soft Abdomen - soft, mild distension; non-tender; few BS present Ext - no edema, non-tender Neuro - alert & oriented, no focal deficits  Lab Results:  Recent Labs    07/24/17 0342 07/24/17 1405  WBC 13.4* 8.7  HGB 9.6* 9.0*  HCT 29.8* 28.8*  PLT 295 263   BMET Recent Labs    07/24/17 0342 07/25/17 0613  NA 147* 138  K 3.3* 3.1*  CL 104 101  CO2 31 31  GLUCOSE 85 110*  BUN 20 14  CREATININE 0.75 0.67  CALCIUM 7.9* 7.7*   PT/INR No results for input(s): LABPROT, INR in the last 72 hours. Comprehensive Metabolic Panel:    Component Value Date/Time   NA 138 07/25/2017 0613   NA 147 (H)  07/24/2017 0342   K 3.1 (L) 07/25/2017 0613   K 3.3 (L) 07/24/2017 0342   CL 101 07/25/2017 0613   CL 104 07/24/2017 0342   CO2 31 07/25/2017 0613   CO2 31 07/24/2017 0342   BUN 14 07/25/2017 0613   BUN 20 07/24/2017 0342   CREATININE 0.67 07/25/2017 0613   CREATININE 0.75 07/24/2017 0342   CREATININE 0.75 01/14/2017 1336   CREATININE 0.58 (L) 04/20/2016 1442   GLUCOSE 110 (H) 07/25/2017 0613   GLUCOSE 85 07/24/2017 0342   CALCIUM 7.7 (L) 07/25/2017 0613   CALCIUM 7.9 (L) 07/24/2017 0342   AST 28 07/18/2017 2040   AST 23 01/14/2017 1336   ALT 15 07/18/2017 2040   ALT 10 01/14/2017 1336   ALKPHOS 74 07/18/2017 2040   ALKPHOS 79 01/14/2017 1336   BILITOT 0.4 07/18/2017 2040   BILITOT 0.3 01/14/2017 1336   PROT 7.3 07/18/2017 2040   PROT 6.7 01/14/2017 1336   ALBUMIN 4.1 07/18/2017 2040   ALBUMIN 4.2 01/14/2017 1336    Studies/Results: Dg Chest Port 1 View  Result Date: 07/24/2017 CLINICAL DATA:  Nonproductive cough for 2 days. COPD, HTN, hx of pneumonia, EXAM: PORTABLE CHEST - 1 VIEW COMPARISON:  07/22/2017 and prior studies FINDINGS: Some progression of bibasilar patchy airspace interstitial opacities. Probable small pleural effusions. Stable left apical opacity corresponding to the nodule seen better on prior CT. Heart size and mediastinal contours are within normal limits. Atheromatous aorta. No pneumothorax. Visualized bones unremarkable. IMPRESSION: 1. Worsening bibasilar infiltrates or edema, with small effusions. Electronically Signed   By: Lucrezia Europe M.D.   On: 07/24/2017 08:10   Dg Abd 2 Views  Result Date: 07/23/2017 CLINICAL DATA:  77 year old female (77 to 77) with a history of small bowel obstruction EXAM: ABDOMEN - 2 VIEW COMPARISON:  07/22/2017 FINDINGS: Compare to the prior plain film enteric contrast has traversed the colon, reaching the rectum. No abnormally distended colon. There has been resolution of distended small bowel loops within the mid abdomen. No persisting dilated  colon or bowel loops. Surgical changes of cholecystectomy. Gastric tube again terminates with the port above the GE junction. No air-fluid levels identified on the decubitus image. Osteopenia and degenerative changes. IMPRESSION: Evidence of resolving ileus/bowel obstruction, with resolution of dilated small bowel loops and enteric contrast having traversed the colon to the rectum. The gastric tube again terminates with the side port above the GE junction, and may be advanced for better positioning. Electronically Signed   By: Corrie Mckusick D.O.   On: 07/23/2017 10:04      Ciearra Rufo M 07/25/2017  Patient ID: Tamara Adams, female   DOB: 07-14-1941, 77 y.o.   MRN: [id number]

## 2017-07-25 NOTE — Progress Notes (Signed)
PROGRESS NOTE    Tamara Adams  DEY:[id number] DOB: Aug 26, 1940 DOA: 07/18/2017 PCP: [name], MD   Brief Narrative: 77 year old female with history of multiple a small bowel obstruction with prior surgeries, Chronic Abdominal pain, COPD with chronic hypoxia on 7 L of oxygen at home, presented with nausea vomiting and abdominal pain.  She was found to have a small bowel obstruction.  The hospital stay has been complicated by onset of atrial fibrillation with RVR, non-STEMI.  She was evaluated by cardiologist recommended medical management.  Assessment & Plan:   #Recurrent small bowel obstruction: NG tube was removed and currently on liquid diet.  Plan to advance as tolerated.  Patient has been passing gas.  Follow-up general surgery's recommendation.  #Atrial fibrillation with RVR: In the setting of stress related with bowel obstruction.  Switching to oral metoprolol by cardiologist.  Plan to discontinue IV diltiazem.  Echocardiogram with grade 1 diastolic dysfunction.  TSH normal.  Received IV heparin which was discontinued.  Defer to cardiology for anticoagulation.  #Non-ST elevation MI, mild pulmonary edema: Treated with IV heparin.  Not a candidate for intervention or invasive procedure as per cardiologist.  Patient does not have chest pain.  On nitroglycerin, beta-blocker.  #Fever, possible aspiration pneumonia: On IV Unasyn.  Continue to monitor.  Aspiration precaution.  #Chronic respiratory failure with hypoxia due to COPD: On 7 L of oxygen.  #Acute kidney injury/hypokalemia: Replete both potassium chloride.  Magnesium level acceptable.  Renal function improved.  #History of hypertension: Monitor blood pressure.  DVT prophylaxis: Lovenox subcutaneous Code Status: Full code Family Communication: No family at bedside Disposition Plan: Currently in a stepdown unit    Consultants:   General surgery  Cardiology  Procedures: Echo Antimicrobials: IV  Unasyn  Subjective: Seen and examined at bedside.  Denied nausea vomiting.  Passing gas.  No chest pain or shortness of breath.  Objective: Vitals:   07/25/17 0800 07/25/17 0900 07/25/17 1000 07/25/17 1100  BP: (!) 158/57 (!) 124/47 (!) 162/51 (!) 145/52  Pulse: 81 77 94 82  Resp: (!) 28 16  20   Temp: 98.3 F (36.8 C)     TempSrc: Oral     SpO2: 99% 100% 98% 100%  Weight:      Height:        Intake/Output Summary (Last 24 hours) at 07/25/2017 1131 Last data filed at 07/25/2017 0600 Gross per 24 hour  Intake 1140.5 ml  Output 1025 ml  Net 115.5 ml   Filed Weights   07/18/17 2020 07/19/17 0251 07/23/17 0346  Weight: 39.9 kg (88 lb) 38.3 kg (84 lb 7 oz) 39 kg (85 lb 15.7 oz)    Examination:  General exam: Appears calm and comfortable  Respiratory system: Clear to auscultation. Respiratory effort normal.  Cardiovascular system: S1 & S2 heard, RRR.  No pedal edema. Gastrointestinal system: Abdomen is nondistended, soft and nontender. Normal bowel sounds heard. Central nervous system: Alert and oriented. No focal neurological deficits. Skin: No rashes, lesions or ulcers Psychiatry: Judgement and insight appear normal. Mood & affect appropriate.     Data Reviewed: I have personally reviewed following labs and imaging studies  CBC: Recent Labs  Lab 07/18/17 2033  07/21/17 0340 07/22/17 0313 07/23/17 0323 07/24/17 0342 07/24/17 1405  WBC 10.3   < > 13.6* 11.0* 11.3* 13.4* 8.7  NEUTROABS 7.6  --   --   --   --   --   --   HGB 11.0*   < > 9.3*  9.6* 8.3* 9.6* 9.0*  HCT 34.8*   < > 30.3* 30.4* 26.3* 29.8* 28.8*  MCV 86.6   < > 89.9 87.6 89.8 89.2 88.6  PLT 380   < > 329 303 288 295 263   < > = values in this interval not displayed.   Basic Metabolic Panel: Recent Labs  Lab 07/21/17 0340 07/22/17 0313 07/23/17 0323 07/24/17 0342 07/25/17 0613  NA 147* 141 145 147* 138  K 3.8 3.1* 3.2* 3.3* 3.1*  CL 106 100* 98* 104 101  CO2 28 33* 38* 31 31  GLUCOSE 92 177*  112* 85 110*  BUN 26* 21* 32* 20 14  CREATININE 1.01* 1.03* 1.19* 0.75 0.67  CALCIUM 7.8* 8.1* 7.9* 7.9* 7.7*  MG  --   --   --  2.2  --    GFR: Estimated Creatinine Clearance: 36.8 mL/min (by C-G formula based on SCr of 0.67 mg/dL). Liver Function Tests: Recent Labs  Lab 07/18/17 2040  AST 28  ALT 15  ALKPHOS 74  BILITOT 0.4  PROT 7.3  ALBUMIN 4.1   No results for input(s): LIPASE, AMYLASE in the last 168 hours. No results for input(s): AMMONIA in the last 168 hours. Coagulation Profile: No results for input(s): INR, PROTIME in the last 168 hours. Cardiac Enzymes: Recent Labs  Lab 07/21/17 1436 07/22/17 0313 07/23/17 0919 07/23/17 1453 07/23/17 2026  TROPONINI 11.33* 6.31* 2.57* 1.98* 1.95*   BNP (last 3 results) No results for input(s): PROBNP in the last 8760 hours. HbA1C: No results for input(s): HGBA1C in the last 72 hours. CBG: No results for input(s): GLUCAP in the last 168 hours. Lipid Profile: Recent Labs    07/25/17 0613  CHOL 118  HDL 41  LDLCALC 51  TRIG 130  CHOLHDL 2.9   Thyroid Function Tests: Recent Labs    07/23/17 0323  TSH 0.462   Anemia Panel: No results for input(s): VITAMINB12, FOLATE, FERRITIN, TIBC, IRON, RETICCTPCT in the last 72 hours. Sepsis Labs: No results for input(s): PROCALCITON, LATICACIDVEN in the last 168 hours.  Recent Results (from the past 240 hour(s))  MRSA PCR Screening     Status: None   Collection Time: 07/22/17  6:34 AM  Result Value Ref Range Status   MRSA by PCR NEGATIVE NEGATIVE Final    Comment:        The GeneXpert MRSA Assay (FDA approved for NASAL specimens only), is one component of a comprehensive MRSA colonization surveillance program. It is not intended to diagnose MRSA infection nor to guide or monitor treatment for MRSA infections.          Radiology Studies: Dg Chest Port 1 View  Result Date: 07/24/2017 CLINICAL DATA:  Nonproductive cough for 2 days. COPD, HTN, hx of pneumonia,  EXAM: PORTABLE CHEST - 1 VIEW COMPARISON:  07/22/2017 and prior studies FINDINGS: Some progression of bibasilar patchy airspace interstitial opacities. Probable small pleural effusions. Stable left apical opacity corresponding to the nodule seen better on prior CT. Heart size and mediastinal contours are within normal limits. Atheromatous aorta. No pneumothorax. Visualized bones unremarkable. IMPRESSION: 1. Worsening bibasilar infiltrates or edema, with small effusions. Electronically Signed   By: Lucrezia Europe M.D.   On: 07/24/2017 08:10        Scheduled Meds: . digoxin  0.25 mg Intravenous Once  . enoxaparin (LOVENOX) injection  30 mg Subcutaneous Q24H  . fluticasone furoate-vilanterol  1 puff Inhalation Daily  . levalbuterol  0.63 mg Nebulization TID  . mouth  rinse  15 mL Mouth Rinse BID  . metoprolol tartrate  25 mg Oral BID  . pantoprazole (PROTONIX) IV  40 mg Intravenous Q12H  . polyethylene glycol  17 g Oral Daily  . trimethoprim-polymyxin b  1 drop Both Eyes Q6H  . umeclidinium bromide  1 puff Inhalation Daily   Continuous Infusions: . sodium chloride 10 mL/hr at 07/25/17 0700  . ampicillin-sulbactam (UNASYN) IV Stopped (07/25/17 0510)  . potassium chloride 10 mEq (07/25/17 1007)     LOS: 6 days    Dron Tanna Furry, MD [facility] Pager [phone]  If 7PM-7AM, please contact night-coverage [email or web address] Password [email or web address] 07/25/2017, 11:31 AM

## 2017-07-25 NOTE — Telephone Encounter (Signed)
Pt needs a new rx printed for Marinol but I'm unable to reorder.

## 2017-07-26 ENCOUNTER — Encounter (HOSPITAL_COMMUNITY): Payer: Self-pay

## 2017-07-26 DIAGNOSIS — I25119 Atherosclerotic heart disease of native coronary artery with unspecified angina pectoris: Secondary | ICD-10-CM

## 2017-07-26 LAB — BASIC METABOLIC PANEL
ANION GAP: 5 (ref 5–15)
BUN: 10 mg/dL (ref 6–20)
CHLORIDE: 102 mmol/L (ref 101–111)
CO2: 27 mmol/L (ref 22–32)
Calcium: 7.6 mg/dL — ABNORMAL LOW (ref 8.9–10.3)
Creatinine, Ser: 0.55 mg/dL (ref 0.44–1.00)
GFR calc Af Amer: >60 mL/min (ref >60)
Glucose, Bld: 93 mg/dL (ref 65–99)
POTASSIUM: 3.8 mmol/L (ref 3.5–5.1)
SODIUM: 134 mmol/L — AB (ref 135–145)

## 2017-07-26 LAB — HEPARIN LEVEL (UNFRACTIONATED): Heparin Unfractionated: <0.1 IU/mL — ABNORMAL LOW (ref 0.30–0.70)

## 2017-07-26 MED ORDER — BOOST PLUS PO LIQD
237.0000 mL | Freq: Three times a day (TID) | ORAL | Status: DC
  Administered 2017-07-26 – 2017-07-27 (×2): 237 mL via ORAL
  Filled 2017-07-26 (×4): qty 237

## 2017-07-26 MED ORDER — ASPIRIN 81 MG PO CHEW
81.0000 mg | CHEWABLE_TABLET | Freq: Every day | ORAL | Status: DC
  Administered 2017-07-26 – 2017-07-27 (×2): 81 mg via ORAL
  Filled 2017-07-26 (×2): qty 1

## 2017-07-26 MED ORDER — AMOXICILLIN-POT CLAVULANATE 875-125 MG PO TABS
1.0000 | ORAL_TABLET | Freq: Two times a day (BID) | ORAL | Status: DC
  Administered 2017-07-26 – 2017-07-27 (×3): 1 via ORAL
  Filled 2017-07-26 (×3): qty 1

## 2017-07-26 MED ORDER — LOSARTAN POTASSIUM 50 MG PO TABS
50.0000 mg | ORAL_TABLET | Freq: Every day | ORAL | Status: DC
  Administered 2017-07-26 – 2017-07-27 (×2): 50 mg via ORAL
  Filled 2017-07-26 (×2): qty 1

## 2017-07-26 MED ORDER — PANTOPRAZOLE SODIUM 40 MG PO TBEC
40.0000 mg | DELAYED_RELEASE_TABLET | Freq: Two times a day (BID) | ORAL | Status: DC
  Administered 2017-07-26 – 2017-07-27 (×2): 40 mg via ORAL
  Filled 2017-07-26 (×2): qty 1

## 2017-07-26 MED ORDER — METOPROLOL TARTRATE 50 MG PO TABS
50.0000 mg | ORAL_TABLET | Freq: Two times a day (BID) | ORAL | Status: DC
  Administered 2017-07-26 – 2017-07-27 (×3): 50 mg via ORAL
  Filled 2017-07-26 (×2): qty 1
  Filled 2017-07-26: qty 2

## 2017-07-26 NOTE — Progress Notes (Signed)
PROGRESS NOTE    Tamara Adams  HYW:[id number] DOB: 22-Aug-1940 DOA: 07/18/2017 PCP: [name], MD   Brief Narrative: 77 year old female with history of multiple a small bowel obstruction with prior surgeries, Chronic Abdominal pain, COPD with chronic hypoxia on 7 L of oxygen at home, presented with nausea vomiting and abdominal pain.  She was found to have a small bowel obstruction.  The hospital stay has been complicated by onset of atrial fibrillation with RVR, non-STEMI.  She was evaluated by cardiologist recommended medical management.  Assessment & Plan:   #Recurrent small bowel obstruction: Tolerating liquid diet therefore advance to regular diet as per surgeon's recommendation.  Patient passed bowel movement.  #Atrial fibrillation with RVR: In the setting of stress related with bowel obstruction.  -Increase the dose of metoprolol to 50 twice a day because of tachycardia.  No need for anticoagulation per cardiologist.  Monitor heart rate.  Transfer out of the stepdown to telemetry floor.  Echocardiogram with grade 1 diastolic dysfunction.  TSH normal.    #Non-ST elevation MI, mild pulmonary edema: Treated with IV heparin.  Not a candidate for intervention or invasive procedure as per cardiologist.  Patient does not have chest pain.  On nitroglycerin, beta-blocker.  Added low-dose aspirin.  Does not have chest pain.  #Fever, possible aspiration pneumonia: Change to Augmentin.  Continue to monitor.  Aspiration precaution.  #Chronic respiratory failure with hypoxia due to COPD: On 7 L of oxygen at home.  #Acute kidney injury/hypokalemia: Replete both potassium chloride.  Magnesium level acceptable.  Renal function improved.  #History of hypertension: Monitor blood pressure.  Blood pressure elevated and medication adjusted today.  DVT prophylaxis: Lovenox subcutaneous Code Status: Full code Family Communication: No family at bedside Disposition Plan: Transfer to telemetry  floor    Consultants:   General surgery  Cardiology  Procedures: Echo Antimicrobials: IV Unasyn from 1/2 to 1/4 and then augmentin  Subjective: Seen and examined at bedside.  Passed gas and bowel movement.  Denied nausea vomiting chest pain shortness of breath. Objective: Vitals:   07/26/17 0700 07/26/17 0800 07/26/17 0900 07/26/17 0944  BP: (!) 147/60 (!) 167/66 (!) 191/79   Pulse: 83 89 99   Resp: 16 15 (!) 24   Temp:      TempSrc:      SpO2: 100% 100% 100% 99%  Weight:      Height:        Intake/Output Summary (Last 24 hours) at 07/26/2017 1048 Last data filed at 07/26/2017 0900 Gross per 24 hour  Intake 660 ml  Output 650 ml  Net 10 ml   Filed Weights   07/18/17 2020 07/19/17 0251 07/23/17 0346  Weight: 39.9 kg (88 lb) 38.3 kg (84 lb 7 oz) 39 kg (85 lb 15.7 oz)    Examination:  General exam: Not in distress Respiratory system: Clear bilateral, respiratory effort normal. Cardiovascular system: Regular rate rhythm S1-S2 normal.  No pedal edema. Gastrointestinal system: Abdomen soft, nontender.  Bowel sounds positive.   Central nervous system: Alert and oriented. No focal neurological deficits. Skin: No rashes, lesions or ulcers Psychiatry: Judgement and insight appear normal. Mood & affect appropriate.     Data Reviewed: I have personally reviewed following labs and imaging studies  CBC: Recent Labs  Lab 07/21/17 0340 07/22/17 0313 07/23/17 0323 07/24/17 0342 07/24/17 1405  WBC 13.6* 11.0* 11.3* 13.4* 8.7  HGB 9.3* 9.6* 8.3* 9.6* 9.0*  HCT 30.3* 30.4* 26.3* 29.8* 28.8*  MCV 89.9 87.6 89.8 89.2  88.6  PLT 329 303 288 295 622   Basic Metabolic Panel: Recent Labs  Lab 07/22/17 0313 07/23/17 0323 07/24/17 0342 07/25/17 0613 07/26/17 0312  NA 141 145 147* 138 134*  K 3.1* 3.2* 3.3* 3.1* 3.8  CL 100* 98* 104 101 102  CO2 33* 38* 31 31 27   GLUCOSE 177* 112* 85 110* 93  BUN 21* 32* 20 14 10   CREATININE 1.03* 1.19* 0.75 0.67 0.55  CALCIUM 8.1*  7.9* 7.9* 7.7* 7.6*  MG  --   --  2.2  --   --    GFR: Estimated Creatinine Clearance: 36.8 mL/min (by C-G formula based on SCr of 0.55 mg/dL). Liver Function Tests: No results for input(s): AST, ALT, ALKPHOS, BILITOT, PROT, ALBUMIN in the last 168 hours. No results for input(s): LIPASE, AMYLASE in the last 168 hours. No results for input(s): AMMONIA in the last 168 hours. Coagulation Profile: No results for input(s): INR, PROTIME in the last 168 hours. Cardiac Enzymes: Recent Labs  Lab 07/21/17 1436 07/22/17 0313 07/23/17 0919 07/23/17 1453 07/23/17 2026  TROPONINI 11.33* 6.31* 2.57* 1.98* 1.95*   BNP (last 3 results) No results for input(s): PROBNP in the last 8760 hours. HbA1C: No results for input(s): HGBA1C in the last 72 hours. CBG: No results for input(s): GLUCAP in the last 168 hours. Lipid Profile: Recent Labs    07/25/17 0613  CHOL 118  HDL 41  LDLCALC 51  TRIG 130  CHOLHDL 2.9   Thyroid Function Tests: No results for input(s): TSH, T4TOTAL, FREET4, T3FREE, THYROIDAB in the last 72 hours. Anemia Panel: No results for input(s): VITAMINB12, FOLATE, FERRITIN, TIBC, IRON, RETICCTPCT in the last 72 hours. Sepsis Labs: No results for input(s): PROCALCITON, LATICACIDVEN in the last 168 hours.  Recent Results (from the past 240 hour(s))  MRSA PCR Screening     Status: None   Collection Time: 07/22/17  6:34 AM  Result Value Ref Range Status   MRSA by PCR NEGATIVE NEGATIVE Final    Comment:        The GeneXpert MRSA Assay (FDA approved for NASAL specimens only), is one component of a comprehensive MRSA colonization surveillance program. It is not intended to diagnose MRSA infection nor to guide or monitor treatment for MRSA infections.          Radiology Studies: No results found.      Scheduled Meds: . enoxaparin (LOVENOX) injection  30 mg Subcutaneous Q24H  . fluticasone furoate-vilanterol  1 puff Inhalation Daily  . levalbuterol  0.63 mg  Nebulization TID  . losartan  50 mg Oral Daily  . mouth rinse  15 mL Mouth Rinse BID  . metoprolol tartrate  50 mg Oral BID  . pantoprazole  40 mg Oral BID  . polyethylene glycol  17 g Oral Daily  . trimethoprim-polymyxin b  1 drop Both Eyes Q6H  . umeclidinium bromide  1 puff Inhalation Daily   Continuous Infusions: . sodium chloride 10 mL/hr at 07/26/17 0600  . ampicillin-sulbactam (UNASYN) IV Stopped (07/26/17 0530)     LOS: 7 days    Willadene Mounsey Tanna Furry, MD [facility] Pager [phone]  If 7PM-7AM, please contact night-coverage [email or web address] Password [facility] 07/26/2017, 10:48 AM

## 2017-07-26 NOTE — Progress Notes (Signed)
Nutrition Follow-up  DOCUMENTATION CODES:   Underweight, Severe malnutrition in context of chronic illness  INTERVENTION:   Boost Plus chocolate TID- Each supplement provides 360kcal and 14g protein.    NUTRITION DIAGNOSIS:   Severe Malnutrition related to chronic illness(COPD) as evidenced by severe fat depletion, severe muscle depletion.  Ongoing  GOAL:   Patient will meet greater than or equal to 90% of their needs  Progressing  MONITOR:   PO intake, Supplement acceptance, Weight trends, Labs  REASON FOR ASSESSMENT:   Other (Comment)(low BMI)    ASSESSMENT:   Pt with PMH significant for COPD on 6L O2 at home, multiple SBOs with multiple surgeries, HTN, HLD, and DVT. Presented to [facility] with increased abdominal pain, nausea, vomiting. Pt transferred for admission. CT scan shows SBO. Pt noted to have myocardial infraction as complication of her small bowel obstruction.   12/30- NGT discontinued 12/31- Pt complaining of persistent nausea, NGT replaced, 1600 ml withdrawn 1/2- NGT removed, pt advanced to clear liquids 1/3- advanced to full liquids 1/4 regular diet  Pt reports having her first solid meal this morning. States she ate an omelet and potatoes without any complication. She finished 50%. Eager to eat dinner. Talked about supplementation while at the hospital. Pt wants to continue with chocolate boost this stay. Weight noted to be down 4 lb since last RD visit. Will continue to encourage PO intake and order supplementation.   Medications reviewed and include: miralax, NS @ 10 ml/hr Labs reviewed: Na 134 (L)   Diet Order:  Diet regular Room service appropriate? Yes; Fluid consistency: Thin  EDUCATION NEEDS:   Education needs have been addressed  Skin:  Skin Assessment: Reviewed RN Assessment  Last BM:  07/25/16  Height:   Ht Readings from Last 1 Encounters:  07/19/17 5\' 3"  (1.6 m)    Weight:   Wt Readings from Last 1 Encounters:  07/23/17 85 lb 15.7  oz (39 kg)    Ideal Body Weight:  52.3 kg  BMI:  Body mass index is 15.23 kg/m.  Estimated Nutritional Needs:   Kcal:  1400-1600 kcal/day  Protein:  60-70 g/day  Fluid:  >1.4 L/day    Mariana Single RD, LDN Clinical Nutrition Pager # - [phone]

## 2017-07-26 NOTE — Evaluation (Signed)
Physical Therapy Evaluation Patient Details Name: Tamara Adams MRN: [id number] DOB: 05-Mar-1941 Today's Date: 07/26/2017   History of Present Illness  77 year old female with a history of severe COPD, hypertension, and recurrent small bowel obstructions.  She presented with a small bowel obstruction complicated by rapid atrial fibrillation and non-ST segment elevation myocardial infarction  Clinical Impression  The patient  Tolerated   A few steps to the Recliner. Limited by pain and weakness. Plans Dc to home  And has multiple family caregivers. Pt admitted with above diagnosis. Pt currently with functional limitations due to the deficits listed below (see PT Problem List). Pt will benefit from skilled PT to increase their independence and safety with mobility to allow discharge to the venue listed below.       Follow Up Recommendations Home health PT    Equipment Recommendations  (may need a youth RW)    Recommendations for Other Services       Precautions / Restrictions Precautions Precautions: Fall Precaution Comments:  6 liters O2 at home Restrictions Weight Bearing Restrictions: No      Mobility  Bed Mobility Overal bed mobility: Needs Assistance Bed Mobility: Supine to Sit     Supine to sit: Min assist;HOB elevated     General bed mobility comments: assist to support trunk when transitioning into sitting and to fully scoot to EOB with use of bed pad   Transfers Overall transfer level: Needs assistance Equipment used: 2 person hand held assist Transfers: Sit to/from [employer] Transfers Sit to Stand: Min assist;+2 physical assistance;+2 safety/equipment Stand pivot transfers: Min assist;+2 physical assistance;+2 safety/equipment       General transfer comment: assist to rise and steady, Mod assist +2  HHA provided for steady assist during pivot to recliner  Ambulation/Gait                Stairs            Wheelchair Mobility    Modified  Rankin (Stroke Patients Only)       Balance Overall balance assessment: Needs assistance Sitting-balance support: Bilateral upper extremity supported Sitting balance-Leahy Scale: Fair       Standing balance-Leahy Scale: Poor                               Pertinent Vitals/Pain Pain Assessment: Faces Faces Pain Scale: Hurts even more Pain Location: abdomen Pain Descriptors / Indicators: Guarding;Grimacing;Discomfort Pain Intervention(s): Monitored during session;Premedicated before session;Repositioned;Limited activity within patient's tolerance    Home Living Family/patient expects to be discharged to:: Private residence Living Arrangements: Other (Comment);Children(granddaughter) Available Help at Discharge: Family Type of Home: House Home Access: Level entry     Home Layout: One level Home Equipment: Bedside commode;Shower seat;Walker - 2 wheels Additional Comments: may need a small waklker    Prior Function Level of Independence: Independent               Hand Dominance        Extremity/Trunk Assessment   Upper Extremity Assessment Upper Extremity Assessment: Defer to OT evaluation    Lower Extremity Assessment Lower Extremity Assessment: Generalized weakness    Cervical / Trunk Assessment Cervical / Trunk Assessment: Kyphotic  Communication   Communication: No difficulties  Cognition Arousal/Alertness: Awake/alert Behavior During Therapy: WFL for tasks assessed/performed Overall Cognitive Status: Within Functional Limits for tasks assessed  General Comments      Exercises     Assessment/Plan    PT Assessment Patient needs continued PT services  PT Problem List Decreased strength;Decreased range of motion;Decreased activity tolerance;Decreased balance;Decreased mobility;Cardiopulmonary status limiting activity;Decreased knowledge of precautions;Decreased safety  awareness;Decreased knowledge of use of DME       PT Treatment Interventions DME instruction;Therapeutic exercise;Gait training;Stair training;Functional mobility training;Therapeutic activities    PT Goals (Current goals can be found in the Care Plan section)  Acute Rehab PT Goals Patient Stated Goal: return home  PT Goal Formulation: With patient/family Time For Goal Achievement: 08/09/17 Potential to Achieve Goals: Good    Frequency Min 3X/week   Barriers to discharge        Co-evaluation PT/OT/SLP Co-Evaluation/Treatment: Yes Reason for Co-Treatment: For patient/therapist safety PT goals addressed during session: Mobility/safety with mobility OT goals addressed during session: ADL's and self-care       AM-PAC PT "6 Clicks" Daily Activity  Outcome Measure Difficulty turning over in bed (including adjusting bedclothes, sheets and blankets)?: A Lot Difficulty moving from lying on back to sitting on the side of the bed? : A Lot Difficulty sitting down on and standing up from a chair with arms (e.g., wheelchair, bedside commode, etc,.)?: A Lot Help needed moving to and from a bed to chair (including a wheelchair)?: A Lot Help needed walking in hospital room?: A Lot Help needed climbing 3-5 steps with a railing? : A Lot 6 Click Score: 12    End of Session   Activity Tolerance: Patient limited by pain;Patient limited by fatigue Patient left: in chair;with call bell/phone within reach;with chair alarm set;with family/visitor present Nurse Communication: Mobility status PT Visit Diagnosis: Unsteadiness on feet (R26.81);Pain    Time: 1017-1030 PT Time Calculation (min) (ACUTE ONLY): 13 min   Charges:   PT Evaluation $PT Eval Low Complexity: 1 Low     PT G CodesTresa Endo PT [phone]   Claretha Cooper 07/26/2017, 1:51 PM

## 2017-07-26 NOTE — Progress Notes (Signed)
General Surgery [facility]  Assessment & Plan:  Small bowel obstruction, likely secondary to adhesions Tolerating full liquid diet yesterday, had bowel movement without Dulcolax Will advance to regular diet this AM OOB, up to chair Will sign off - no general surgery follow up needed at present - call if needed.        Earnstine Regal, MD, [facility]       Office: [phone]    Chief Complaint: Small bowel obstruction  Subjective: Patient comfortable, tolerating diet.  Had spontaneous BM.  Denies nausea, emesis, abd pain.  Objective: Vital signs in last 24 hours: Temp:  [98.2 F (36.8 C)-98.8 F (37.1 C)] 98.3 F (36.8 C) (01/04 0335) Pulse Rate:  [77-97] 92 (01/04 0600) Resp:  [16-32] 22 (01/04 0600) BP: (124-185)/(43-109) 185/74 (01/04 0600) SpO2:  [96 %-100 %] 100 % (01/04 0600) Last BM Date: 07/25/17  Intake/Output from previous day: 01/03 0701 - 01/04 0700 In: 340 [I.V.:240; IV Piggyback:100] Out: 650 [Urine:650] Intake/Output this shift: No intake/output data recorded.  Physical Exam: HEENT - sclerae clear, mucous membranes moist Neck - soft Abdomen - soft without distension; BS present Ext - no edema, non-tender Neuro - alert & oriented, no focal deficits  Lab Results:  Recent Labs    07/24/17 0342 07/24/17 1405  WBC 13.4* 8.7  HGB 9.6* 9.0*  HCT 29.8* 28.8*  PLT 295 263   BMET Recent Labs    07/25/17 0613 07/26/17 0312  NA 138 134*  K 3.1* 3.8  CL 101 102  CO2 31 27  GLUCOSE 110* 93  BUN 14 10  CREATININE 0.67 0.55  CALCIUM 7.7* 7.6*   PT/INR No results for input(s): LABPROT, INR in the last 72 hours. Comprehensive Metabolic Panel:    Component Value Date/Time   NA 134 (L) 07/26/2017 0312   NA 138 07/25/2017 0613   K 3.8 07/26/2017 0312   K 3.1 (L) 07/25/2017 0613   CL 102 07/26/2017 0312   CL 101 07/25/2017 0613   CO2 27  07/26/2017 0312   CO2 31 07/25/2017 0613   BUN 10 07/26/2017 0312   BUN 14 07/25/2017 0613   CREATININE 0.55 07/26/2017 0312   CREATININE 0.67 07/25/2017 0613   CREATININE 0.75 01/14/2017 1336   CREATININE 0.58 (L) 04/20/2016 1442   GLUCOSE 93 07/26/2017 0312   GLUCOSE 110 (H) 07/25/2017 0613   CALCIUM 7.6 (L) 07/26/2017 0312   CALCIUM 7.7 (L) 07/25/2017 0613   AST 28 07/18/2017 2040   AST 23 01/14/2017 1336   ALT 15 07/18/2017 2040   ALT 10 01/14/2017 1336   ALKPHOS 74 07/18/2017 2040   ALKPHOS 79 01/14/2017 1336   BILITOT 0.4 07/18/2017 2040   BILITOT 0.3 01/14/2017 1336   PROT 7.3 07/18/2017 2040   PROT 6.7 01/14/2017 1336   ALBUMIN 4.1 07/18/2017 2040   ALBUMIN 4.2 01/14/2017 1336    Studies/Results: Dg Chest Port 1 View  Result Date: 07/24/2017 CLINICAL DATA:  Nonproductive cough for 2 days. COPD, HTN, hx of pneumonia, EXAM: PORTABLE CHEST - 1 VIEW COMPARISON:  07/22/2017 and prior studies FINDINGS: Some progression of bibasilar patchy airspace interstitial opacities. Probable small pleural effusions. Stable left apical opacity corresponding to the nodule seen better on prior CT. Heart size and mediastinal contours are within normal limits. Atheromatous aorta. No pneumothorax. Visualized bones unremarkable. IMPRESSION: 1. Worsening bibasilar infiltrates or edema, with small effusions. Electronically Signed   By:  Lucrezia Europe M.D.   On: 07/24/2017 08:10      Fuad Forget M 07/26/2017  Patient ID: Tamara Adams, female   DOB: 10-Aug-1940, 77 y.o.   MRN: [id number]

## 2017-07-26 NOTE — Evaluation (Addendum)
Occupational Therapy Evaluation Patient Details Name: Tamara Adams MRN: [id number] DOB: Dec 23, 1940 Today's Date: 07/26/2017    History of Present Illness 77 year old female with a history of severe COPD, hypertension, and recurrent small bowel obstructions.  She presented with a small bowel obstruction complicated by rapid atrial fibrillation and non-ST segment elevation myocardial infarction   Clinical Impression   This 77 y/o F presents with the above. At baseline Pt is independent with ADLs and functional mobility within her home. Pt completed stand pivot to recliner this session with MinA+2, presenting with generalized weakness and increased fatigue with minimal activity. Pt will benefit from continued acute OT services and recommend additional [place] services after discharge to maximize Pt's safety and independence with ADLs and mobility.     Follow Up Recommendations  Home health OT;Supervision/Assistance - 24 hour    Equipment Recommendations  None recommended by OT           Precautions / Restrictions Precautions Precautions: Fall Restrictions Weight Bearing Restrictions: No      Mobility Bed Mobility Overal bed mobility: Needs Assistance Bed Mobility: Supine to Sit     Supine to sit: Min assist;HOB elevated     General bed mobility comments: assist to support trunk when transitioning into sitting and to fully scoot to EOB with use of bed pad   Transfers Overall transfer level: Needs assistance Equipment used: 2 person hand held assist Transfers: Sit to/from [employer] Transfers Sit to Stand: Min assist;+2 physical assistance;+2 safety/equipment Stand pivot transfers: Min assist;+2 physical assistance;+2 safety/equipment       General transfer comment: assist to rise and steady, MinA+2 provided for steady assist during pivot to recliner    Balance Overall balance assessment: Needs assistance   Sitting balance-Leahy Scale: Fair       Standing  balance-Leahy Scale: Poor                             ADL either performed or assessed with clinical judgement   ADL Overall ADL's : Needs assistance/impaired Eating/Feeding: Set up;Sitting   Grooming: Set up;Sitting;Oral care;Wash/dry face   Upper Body Bathing: Min guard;Sitting   Lower Body Bathing: Minimal assistance;Sit to/from stand   Upper Body Dressing : Min guard;Sitting   Lower Body Dressing: Moderate assistance;Sit to/from stand   Toilet Transfer: Minimal assistance;+2 for safety/equipment;Stand-pivot;BSC Toilet Transfer Details (indicate cue type and reason): simulated in transfer to recliner from [place] and Hygiene: Minimal assistance;Sit to/from stand       Functional mobility during ADLs: Minimal assistance;+2 for physical assistance;+2 for safety/equipment General ADL Comments: Pt completed bed mobility, stand pivot to recliner with MinA +2; setup assist provided for grooming ADLs; Pt very easily fatigued with short activity; at baseline Pt is on 7-8L supplemental O2                           Pertinent Vitals/Pain Pain Assessment: No/denies pain          Extremity/Trunk Assessment Upper Extremity Assessment Upper Extremity Assessment: Generalized weakness   Lower Extremity Assessment Lower Extremity Assessment: Defer to PT evaluation       Communication Communication Communication: No difficulties   Cognition Arousal/Alertness: Awake/alert Behavior During Therapy: WFL for tasks assessed/performed Overall Cognitive Status: Within Functional Limits for tasks assessed  Home Living Family/patient expects to be discharged to:: Private residence Living Arrangements: Other (Comment);Children(granddaughter) Available Help at Discharge: Family Type of Home: House Home Access: Level entry     Home Layout: One level      Bathroom Shower/Tub: Tub/shower unit;Walk-in shower   Bathroom Toilet: Handicapped height     Home Equipment: Bedside commode;Shower seat;Walker - 2 wheels          Prior Functioning/Environment Level of Independence: Independent                 OT Problem List: Decreased strength;Impaired balance (sitting and/or standing);Decreased activity tolerance;Cardiopulmonary status limiting activity      OT Treatment/Interventions: Self-care/ADL training;DME and/or AE instruction;Therapeutic activities;Balance training;Therapeutic exercise;Energy conservation;Patient/family education    OT Goals(Current goals can be found in the care plan section) Acute Rehab OT Goals Patient Stated Goal: return home  OT Goal Formulation: With patient Time For Goal Achievement: 08/09/17 Potential to Achieve Goals: Good  OT Frequency: Min 2X/week               Co-evaluation PT/OT/SLP Co-Evaluation/Treatment: Yes Reason for Co-Treatment: For patient/therapist safety PT goals addressed during session: Mobility/safety with mobility OT goals addressed during session: ADL's and self-care      AM-PAC PT "6 Clicks" Daily Activity     Outcome Measure Help from another person eating meals?: None Help from another person taking care of personal grooming?: A Little Help from another person toileting, which includes using toliet, bedpan, or urinal?: A Lot Help from another person bathing (including washing, rinsing, drying)?: A Lot Help from another person to put on and taking off regular upper body clothing?: A Little Help from another person to put on and taking off regular lower body clothing?: A Lot 6 Click Score: 16   End of Session Equipment Utilized During Treatment: Oxygen Nurse Communication: Mobility status  Activity Tolerance: Patient tolerated treatment well;Patient limited by fatigue Patient left: in chair;with call bell/phone within reach;with family/visitor present  OT Visit  Diagnosis: Unsteadiness on feet (R26.81);Muscle weakness (generalized) (M62.81)                Time: 7253-6644 OT Time Calculation (min): 18 min Charges:  OT General Charges $OT Visit: 1 Visit OT Evaluation $OT Eval Moderate Complexity: 1 Mod G-Codes:     Lou Cal, OT Pager [phone] 07/26/2017   Tamara Adams 07/26/2017, 12:00 PM

## 2017-07-26 NOTE — Progress Notes (Signed)
Progress Note  Patient Name: Tamara Adams Date of Encounter: 07/26/2017  Primary Cardiologist: Kainen Struckman   Subjective   77 year old female with a history of severe COPD, hypertension, and recurrent small bowel obstructions.  She presented with a small bowel obstruction complicated by rapid atrial fibrillation and non-ST segment elevation myocardial infarction.  She is been treated medically.  She is thought to be of relatively poor candidate for invasive or interventional cardiology procedures.  She has converted back to sinus rhythm.  She has not had any further episodes of shortness of breath.  She is not having any chest pain.  Her left ventricular systolic function is normal by echocardiogram.  Inpatient Medications    Scheduled Meds: . digoxin  0.25 mg Intravenous Once  . enoxaparin (LOVENOX) injection  30 mg Subcutaneous Q24H  . fluticasone furoate-vilanterol  1 puff Inhalation Daily  . levalbuterol  0.63 mg Nebulization TID  . mouth rinse  15 mL Mouth Rinse BID  . metoprolol tartrate  25 mg Oral BID  . pantoprazole (PROTONIX) IV  40 mg Intravenous Q12H  . polyethylene glycol  17 g Oral Daily  . trimethoprim-polymyxin b  1 drop Both Eyes Q6H  . umeclidinium bromide  1 puff Inhalation Daily   Continuous Infusions: . sodium chloride 10 mL/hr at 07/26/17 0600  . ampicillin-sulbactam (UNASYN) IV Stopped (07/26/17 0530)   PRN Meds: acetaminophen **OR** acetaminophen, bisacodyl, hydrALAZINE, HYDROmorphone (DILAUDID) injection, levalbuterol, lidocaine, nitroGLYCERIN, [DISCONTINUED] ondansetron **OR** ondansetron (ZOFRAN) IV, phenol, promethazine   Vital Signs    Vitals:   07/26/17 0343 07/26/17 0400 07/26/17 0500 07/26/17 0600  BP: (!) 165/65 (!) 165/61 (!) 184/74 (!) 185/74  Pulse: 83 82 96 92  Resp: 17 17 (!) 22 (!) 22  Temp:      TempSrc:      SpO2: 100% 100% 100% 100%  Weight:      Height:        Intake/Output Summary (Last 24 hours) at 07/26/2017 0804 Last data filed  at 07/26/2017 0600 Gross per 24 hour  Intake 390 ml  Output 650 ml  Net -260 ml   Filed Weights   07/18/17 2020 07/19/17 0251 07/23/17 0346  Weight: 88 lb (39.9 kg) 84 lb 7 oz (38.3 kg) 85 lb 15.7 oz (39 kg)    Telemetry    NSR  - Personally Reviewed  ECG     NSR  - Personally Reviewed  Physical Exam   GEN:  Frail, elderly female, no acute distress Neck: No JVD Cardiac: RRR, no murmurs, rubs, or gallops.  Respiratory: Clear to auscultation bilaterally. GI: Soft, nontender, non-distended  MS: No edema; No deformity. Neuro:  Nonfocal  Psych: Normal affect   Labs    Chemistry Recent Labs  Lab 07/24/17 0342 07/25/17 0613 07/26/17 0312  NA 147* 138 134*  K 3.3* 3.1* 3.8  CL 104 101 102  CO2 31 31 27   GLUCOSE 85 110* 93  BUN 20 14 10   CREATININE 0.75 0.67 0.55  CALCIUM 7.9* 7.7* 7.6*  GFRNONAA >60 >60 >60  GFRAA >60 >60 >60  ANIONGAP 12 6 5      Hematology Recent Labs  Lab 07/23/17 0323 07/24/17 0342 07/24/17 1405  WBC 11.3* 13.4* 8.7  RBC 2.93* 3.34* 3.25*  HGB 8.3* 9.6* 9.0*  HCT 26.3* 29.8* 28.8*  MCV 89.8 89.2 88.6  MCH 28.3 28.7 27.7  MCHC 31.6 32.2 31.3  RDW 15.5 15.6* 15.3  PLT 288 295 263    Cardiac Enzymes Recent Labs  Lab 07/22/17 0313 07/23/17 0919 07/23/17 1453 07/23/17 2026  TROPONINI 6.31* 2.57* 1.98* 1.95*   No results for input(s): TROPIPOC in the last 168 hours.   BNPNo results for input(s): BNP, PROBNP in the last 168 hours.   DDimer No results for input(s): DDIMER in the last 168 hours.   Radiology    No results found.  Cardiac Studies     Patient Profile     77 y.o. female with small bowel obstruction with a subsequent rapid atrial fibrillation and non-ST segment elevation myocardial infarction.  Assessment & Plan    1.  Coronary artery disease.  Patient is status post ST segment elevation myocardial infarction.  This infarction occurred in the setting of small bowel obstruction in rapid atrial fibrillation.   She is done well on medical management.  She is now taking medicines by mouth.  Her heart rate is still a little fast.  We will increase the metoprolol to 50 mg twice a day.  2.  Essential hypertension: Her blood pressure remains a little elevated.  She was on amlodipine on admission.  We will start her on losartan 50 mg a day.  3.  Paroxysmal atrial fibrillation: This was in the setting of a small bowel obstruction.  She is converted back to sinus rhythm on IV diltiazem and IV metoprolol.  I do not think that she necessarily needs to have chronic  anticoagulation based on this short run of atrial fibrillation in the setting of small bowel obstruction.  If she has recurrent atrial fibrillation, then we certainly will need to consider long-term oral anticoagulation.  For questions or updates, please contact [place] Please consult [email or web address] for contact info under Cardiology/STEMI.      Signed, Mertie Moores, MD  07/26/2017, 8:04 AM

## 2017-07-26 NOTE — Progress Notes (Signed)
Report called to receiving RN [id number]. PAtient with no complaints at the current time. Will transfer via [place].

## 2017-07-26 NOTE — Progress Notes (Signed)
The patient is receiving Protonix by the intravenous route.  Based on criteria approved by the Pharmacy and [facility], the medication is being converted to the equivalent oral dose form.  These criteria include: -No active GI bleeding -Able to tolerate diet of full liquids (or better) or tube feeding -Able to tolerate other medications by the oral or enteral route  If you have any questions about this conversion, please contact the Pharmacy Department (phone [phone]).  Thank you. Tamara Adams, Pharm.D. [phone] 07/26/2017 9:32 AM

## 2017-07-27 DIAGNOSIS — J449 Chronic obstructive pulmonary disease, unspecified: Secondary | ICD-10-CM

## 2017-07-27 LAB — BASIC METABOLIC PANEL
Anion gap: 6 (ref 5–15)
BUN: 12 mg/dL (ref 6–20)
CALCIUM: 7.6 mg/dL — AB (ref 8.9–10.3)
CO2: 28 mmol/L (ref 22–32)
CREATININE: 0.43 mg/dL — AB (ref 0.44–1.00)
Chloride: 103 mmol/L (ref 101–111)
GFR calc non Af Amer: >60 mL/min (ref >60)
Glucose, Bld: 98 mg/dL (ref 65–99)
Potassium: 3.7 mmol/L (ref 3.5–5.1)
SODIUM: 137 mmol/L (ref 135–145)

## 2017-07-27 MED ORDER — METOPROLOL TARTRATE 50 MG PO TABS: 50.0000 mg | ORAL_TABLET | Freq: Two times a day (BID) | ORAL | 0 refills | Status: DC

## 2017-07-27 MED ORDER — CLOPIDOGREL BISULFATE 75 MG PO TABS: 75.0000 mg | ORAL_TABLET | Freq: Every day | ORAL | 1 refills | Status: DC

## 2017-07-27 MED ORDER — AMOXICILLIN-POT CLAVULANATE 875-125 MG PO TABS: 1.0000 | ORAL_TABLET | Freq: Two times a day (BID) | ORAL | 0 refills | Status: AC

## 2017-07-27 MED ORDER — POLYETHYLENE GLYCOL 3350 17 G PO PACK: 17.0000 g | PACK | Freq: Every day | ORAL | 0 refills | Status: AC

## 2017-07-27 NOTE — Care Management Note (Signed)
Case Management Note  Patient Details  Name: Tamara Adams MRN: [id number] Date of Birth: 10/18/1940                     Action/Plan: Discharge Planning:  Spoke to pt at bedside. Offered choice for HH/list provided. States her husband had [facility] in the past. Contacted [facility] with new referral. Has oxygen ([employer]), RW and bedsie commode at home.    PCP Beatrice Lecher D MD    Expected Discharge Date:  07/27/17               Expected Discharge Plan:  [place]  In-House Referral:  NA  Discharge planning Services  CM Consult  Post Acute Care Choice:  Home Health Choice offered to:  Patient  DME Arranged:  N/A DME Agency:  NA  HH Arranged:  RN, PT, OT, Nurse's Aide, Respirator Therapy [place] Agency:  [facility]  Status of Service:  Completed, signed off  If discussed at [place] of Stay Meetings, dates discussed:    Additional Comments:  Erenest Rasher, RN 07/27/2017, 12:59 PM

## 2017-07-27 NOTE — Progress Notes (Signed)
Reviewed DC summary with pt. Pt verbalizes understanding.  Pt takes medication by herself at home. Pt has all belongings. Unable to leave at this time. Waiting for ride.

## 2017-07-27 NOTE — Care Management Important Message (Signed)
Important Message  Patient Details  Name: Tamara Adams MRN: [id number] Date of Birth: 07-30-1940   Medicare Important Message Given:  Yes    Erenest Rasher, RN 07/27/2017, 3:13 PM

## 2017-07-27 NOTE — Progress Notes (Signed)
PT on 8lnc SPO2 98% RR 20. Decreased O2 7lnc. After 5 min pt RR20  SPO2 97-98%. States she feel SOB. Increased back to 8 lnc. Resolved. Pt on 7-8lnc at home

## 2017-07-27 NOTE — Discharge Summary (Addendum)
Physician Discharge Summary  Tamara Adams FYB:[id number] DOB: 02/26/1941 DOA: 07/18/2017  PCP: [name], MD  Admit date: 07/18/2017 Discharge date: 07/27/2017  Admitted From:home Disposition:home   Recommendations for Outpatient Follow-up:  1. Follow up with PCP in 1-2 weeks 2. Please obtain BMP/CBC in one week   Home Health:yes Equipment/Devices:oxygen Discharge Condition:stable CODE STATUS:full code Diet recommendation:heart healthy  Brief/Interim Summary: 77 year old female with history of multiple a small bowel obstruction with prior surgeries, Chronic Abdominal pain, COPD with chronic hypoxia on 7 L of oxygen at home, presented with nausea vomiting and abdominal pain.  She was found to have a small bowel obstruction.  The hospital stay has been complicated by onset of atrial fibrillation with RVR, non-STEMI.  She was evaluated by cardiologist recommended medical management.  #Recurrent small bowel obstruction:  Tolerating diet well.  Patient having good bowel movement.  No abdominal pain.  Evaluated by general surgeon.  #Atrial fibrillation with RVR: In the setting of stress related with bowel obstruction.  -Increase the dose of metoprolol to 50 twice a day because of tachycardia.  No need for anticoagulation per cardiologist.    Echocardiogram with grade 1 diastolic dysfunction.  TSH normal.    #Non-ST elevation MI, mild pulmonary edema: On medical management.  Started on Plavix by cardiologist.  Patient does not have chest pain.  Recommended outpatient follow-up with a cardiologist.  #Fever, possible aspiration pneumonia:  Continue with 3 more days of Augmentin to complete the course.  Respiratory status stable.  She is on 7 L of oxygen which is her home dose.  #Chronic respiratory failure with hypoxia due to COPD: On 7 L of oxygen at home.  #Acute kidney injury/hypokalemia: Improved.  #History of hypertension: Monitor blood pressure.    Patient is  clinically stable.  Denied headache, dizziness, nausea vomiting chest pain shortness of breath.  Tolerating diet well.  Passing stool.  She has a chronic dyspnea and on high oxygen.  Recommend to follow-up with her PCP, pulmonologist and cardiologist.  She verbalized understanding. Discharging home with home care services including therapies and home health aide.   Discharge Diagnoses:  Principal Problem:   SBO (small bowel obstruction) (HCC) Active Problems:   COPD (chronic obstructive pulmonary disease) (HCC)   Chronic hypoxemic respiratory failure (HCC)   Essential hypertension   Persistent atrial fibrillation (HCC)   Encounter for nasogastric (NG) tube placement Severe protein calorie malnutrition   Discharge Instructions  Discharge Instructions    Call MD for:  difficulty breathing, headache or visual disturbances   Complete by:  As directed    Call MD for:  extreme fatigue   Complete by:  As directed    Call MD for:  hives   Complete by:  As directed    Call MD for:  persistant dizziness or light-headedness   Complete by:  As directed    Call MD for:  persistant nausea and vomiting   Complete by:  As directed    Call MD for:  severe uncontrolled pain   Complete by:  As directed    Call MD for:  temperature >100.4   Complete by:  As directed    Diet - low sodium heart healthy   Complete by:  As directed    Increase activity slowly   Complete by:  As directed      Allergies as of 07/27/2017      Reactions   Maxidex [dexamethasone] Other (See Comments)   DEHYDRATION AND HEART RACING  Advair Diskus [fluticasone-salmeterol] Other (See Comments)   No benefit with lungs (INEFFECTIVE)   Remeron [mirtazapine] Other (See Comments)   CAUSED NIGHTMARES   Trazodone And Nefazodone Other (See Comments)   Heart pounding   Tylox [oxycodone-acetaminophen] Itching      Medication List    STOP taking these medications   amLODipine 5 MG tablet Commonly known as:  NORVASC    metoprolol succinate 50 MG 24 hr tablet Commonly known as:  TOPROL-XL   OXYGEN     TAKE these medications   acetaminophen 325 MG tablet Commonly known as:  TYLENOL Take 325-650 mg by mouth every 6 (six) hours as needed for headache.   albuterol (2.5 MG/3ML) 0.083% nebulizer solution Commonly known as:  PROVENTIL USE 1 VIAL (3 ML) VIA NEBULIZER EVERY 6 HOURS AS NEEDED FOR WHEEZING OR SHORTNESS OF BREATH What changed:  Another medication with the same name was removed. Continue taking this medication, and follow the directions you see here.   AMBULATORY NON FORMULARY MEDICATION Medication Name: Needs oxygen concentrator set to 6 liter per minutes as she has a long tubing on her machine.  Fax to [facility] Medication Name: GI cocktail: 116mL Lidocaine2%, 145ml Maalox, 49ml Donnatol. Ok to take twice daily.   AMBULATORY NON FORMULARY MEDICATION Wheel Chair   AMBULATORY NON FORMULARY MEDICATION PT and OT Evaluation and Treatment.   amoxicillin-clavulanate 875-125 MG tablet Commonly known as:  AUGMENTIN Take 1 tablet by mouth every 12 (twelve) hours for 3 days.   clopidogrel 75 MG tablet Commonly known as:  PLAVIX Take 1 tablet (75 mg total) by mouth daily.   dronabinol 2.5 MG capsule Commonly known as:  MARINOL Take 1 capsule (2.5 mg total) by mouth 2 (two) times daily.   fluticasone furoate-vilanterol 100-25 MCG/INH Aepb Commonly known as:  BREO ELLIPTA Inhale 1 puff into the lungs daily.   guaiFENesin 600 MG 12 hr tablet Commonly known as:  MUCINEX Take 1 tablet (600 mg total) by mouth 2 (two) times daily.   HYDROcodone-acetaminophen 10-325 MG tablet Commonly known as:  NORCO Take 1 tablet every 6 (six) hours as needed by mouth. OK to fill 06/18/17 What changed:    reasons to take this  additional instructions   INCRUSE ELLIPTA 62.5 MCG/INH Aepb Generic drug:  umeclidinium bromide USE 1 INHALATION DAILY   lidocaine 2 %  solution Commonly known as:  XYLOCAINE Use as directed 20 mLs in the mouth or throat 2 (two) times daily as needed. What changed:  reasons to take this   losartan 100 MG tablet Commonly known as:  COZAAR TAKE ONE-HALF (1/2) TABLET DAILY What changed:    how much to take  how to take this  when to take this   metoprolol tartrate 50 MG tablet Commonly known as:  LOPRESSOR Take 1 tablet (50 mg total) by mouth 2 (two) times daily.   multivitamin with minerals Tabs tablet Take 1 tablet by mouth daily. Pt uses One-A-Day brand   nystatin 100000 UNIT/ML suspension Commonly known as:  MYCOSTATIN Take 5 mLs (500,000 Units total) by mouth 4 (four) times daily.   omeprazole 40 MG capsule Commonly known as:  PRILOSEC Take 1 capsule (40 mg total) by mouth 2 (two) times daily.   ondansetron 8 MG disintegrating tablet Commonly known as:  ZOFRAN-ODT PLACE 1 TABLET ON THE TONGUE EVERY 8 HOURS AS NEEDED FOR NAUSEA   polyethylene glycol packet Commonly known as:  MIRALAX / GLYCOLAX Take 17  g by mouth daily. Start taking on:  07/28/2017   promethazine 25 MG tablet Commonly known as:  PHENERGAN Take 1 tablet (25 mg total) by mouth every 8 (eight) hours as needed for nausea or vomiting.   ranitidine 150 MG tablet Commonly known as:  ZANTAC Take 1 tablet (150 mg total) by mouth daily.   traMADol 50 MG tablet Commonly known as:  ULTRAM Take 1 tablet (50 mg total) by mouth every 6 (six) hours as needed (pain).   trimethoprim-polymyxin b ophthalmic solution Commonly known as:  POLYTRIM One drop in effected eye every 6 hours for 7 days for conjunctivitis.            Durable Medical Equipment  (From admission, onward)        Start     Ordered   07/27/17 1002  DME Oxygen  Once    Question Answer Comment  Mode or (Route) Nasal cannula   Liters per Minute 7   Oxygen conserving device Yes   Oxygen delivery system Gas      07/27/17 1002     Follow-up Information    [name], MD. Schedule an appointment as soon as possible for a visit in 1 week(s).   Specialty:  Family Medicine Contact information: [address] [place] [address] [phone]        Nahser, Wonda Cheng, MD. Schedule an appointment as soon as possible for a visit in 2 week(s).   Specialty:  Cardiology Contact information: [place] [address] [place] [address] [phone]          Allergies  Allergen Reactions  . Maxidex [Dexamethasone] Other (See Comments)    DEHYDRATION AND HEART RACING  . Advair Diskus [Fluticasone-Salmeterol] Other (See Comments)    No benefit with lungs (INEFFECTIVE)  . Remeron [Mirtazapine] Other (See Comments)    CAUSED NIGHTMARES  . Trazodone And Nefazodone Other (See Comments)    Heart pounding  . Tylox [Oxycodone-Acetaminophen] Itching    Consultations: Cardiology General surgery  Procedures/Studies: None  Subjective: Seen and examined at bedside.  Reported feeling well.  Denies headache, dizziness, nausea vomiting chest pain shortness of breath.  Tolerating diet well.  Having good bowel movement.  Discharge Exam: Vitals:   07/27/17 0830 07/27/17 0900  BP:    Pulse:    Resp:    Temp:    SpO2: 96% 99%   Vitals:   07/26/17 2025 07/27/17 0456 07/27/17 0830 07/27/17 0900  BP: (!) 151/70 (!) 141/61    Pulse: (!) 103 92    Resp: (!) 22 20    Temp: 98.2 F (36.8 C) 98.4 F (36.9 C)    TempSrc: Oral Oral    SpO2: 100% 97% 96% 99%  Weight:      Height:        General: Pt is alert, awake, not in acute distress Cardiovascular: RRR, S1/S2 +, no rubs, no gallops Respiratory: CTA bilaterally, no wheezing, no rhonchi Abdominal: Soft, NT, ND, bowel sounds + Extremities: no edema, no cyanosis    The results of significant diagnostics from this hospitalization (including imaging, microbiology, ancillary and laboratory) are listed below for reference.     Microbiology: Recent Results (from  the past 240 hour(s))  MRSA PCR Screening     Status: None   Collection Time: 07/22/17  6:34 AM  Result Value Ref Range Status   MRSA by PCR NEGATIVE NEGATIVE Final    Comment:  The GeneXpert MRSA Assay (FDA approved for NASAL specimens only), is one component of a comprehensive MRSA colonization surveillance program. It is not intended to diagnose MRSA infection nor to guide or monitor treatment for MRSA infections.      Labs: BNP (last 3 results) No results for input(s): BNP in the last 8760 hours. Basic Metabolic Panel: Recent Labs  Lab 07/23/17 0323 07/24/17 0342 07/25/17 0613 07/26/17 0312 07/27/17 0525  NA 145 147* 138 134* 137  K 3.2* 3.3* 3.1* 3.8 3.7  CL 98* 104 101 102 103  CO2 38* 31 31 27 28   GLUCOSE 112* 85 110* 93 98  BUN 32* 20 14 10 12   CREATININE 1.19* 0.75 0.67 0.55 0.43*  CALCIUM 7.9* 7.9* 7.7* 7.6* 7.6*  MG  --  2.2  --   --   --    Liver Function Tests: No results for input(s): AST, ALT, ALKPHOS, BILITOT, PROT, ALBUMIN in the last 168 hours. No results for input(s): LIPASE, AMYLASE in the last 168 hours. No results for input(s): AMMONIA in the last 168 hours. CBC: Recent Labs  Lab 07/21/17 0340 07/22/17 0313 07/23/17 0323 07/24/17 0342 07/24/17 1405  WBC 13.6* 11.0* 11.3* 13.4* 8.7  HGB 9.3* 9.6* 8.3* 9.6* 9.0*  HCT 30.3* 30.4* 26.3* 29.8* 28.8*  MCV 89.9 87.6 89.8 89.2 88.6  PLT 329 303 288 295 263   Cardiac Enzymes: Recent Labs  Lab 07/21/17 1436 07/22/17 0313 07/23/17 0919 07/23/17 1453 07/23/17 2026  TROPONINI 11.33* 6.31* 2.57* 1.98* 1.95*   BNP: Invalid input(s): POCBNP CBG: No results for input(s): GLUCAP in the last 168 hours. D-Dimer No results for input(s): DDIMER in the last 72 hours. Hgb A1c No results for input(s): HGBA1C in the last 72 hours. Lipid Profile Recent Labs    07/25/17 0613  CHOL 118  HDL 41  LDLCALC 51  TRIG 130  CHOLHDL 2.9   Thyroid function studies No results for input(s):  TSH, T4TOTAL, T3FREE, THYROIDAB in the last 72 hours.  Invalid input(s): FREET3 Anemia work up No results for input(s): VITAMINB12, FOLATE, FERRITIN, TIBC, IRON, RETICCTPCT in the last 72 hours. Urinalysis    Component Value Date/Time   COLORURINE YELLOW 03/30/2016 1635   APPEARANCEUR CLOUDY (A) 03/30/2016 1635   LABSPEC 1.019 03/30/2016 1635   PHURINE 7.0 03/30/2016 1635   GLUCOSEU NEGATIVE 03/30/2016 1635   HGBUR TRACE (A) 03/30/2016 1635   BILIRUBINUR neg 05/04/2016 1525   KETONESUR 15 (A) 03/30/2016 1635   PROTEINUR 30 05/04/2016 1525   PROTEINUR 100 (A) 03/30/2016 1635   UROBILINOGEN 0.2 05/04/2016 1525   UROBILINOGEN 0.2 03/06/2015 2133   NITRITE neg 05/04/2016 1525   NITRITE NEGATIVE 03/30/2016 1635   LEUKOCYTESUR Negative 05/04/2016 1525   Sepsis Labs Invalid input(s): PROCALCITONIN,  WBC,  LACTICIDVEN Microbiology Recent Results (from the past 240 hour(s))  MRSA PCR Screening     Status: None   Collection Time: 07/22/17  6:34 AM  Result Value Ref Range Status   MRSA by PCR NEGATIVE NEGATIVE Final    Comment:        The GeneXpert MRSA Assay (FDA approved for NASAL specimens only), is one component of a comprehensive MRSA colonization surveillance program. It is not intended to diagnose MRSA infection nor to guide or monitor treatment for MRSA infections.      Time coordinating discharge:  30 minutes  SIGNED:   Rosita Fire, MD  [facility] 07/27/2017, 10:12 AM  If 7PM-7AM, please contact night-coverage [email or web address] Password [email or web address]

## 2017-07-27 NOTE — Progress Notes (Signed)
Occupational Therapy Treatment Patient Details Name: Tamara Adams MRN: [id number] DOB: 02-Sep-1940 Today's Date: 07/27/2017    History of present illness 77 year old female with a history of severe COPD, hypertension, and recurrent small bowel obstructions.  She presented with a small bowel obstruction complicated by rapid atrial fibrillation and non-ST segment elevation myocardial infarction   OT comments  Pt may DC this day  Follow Up Recommendations  Home health OT;Supervision/Assistance - 24 hour    Equipment Recommendations  None recommended by OT    Recommendations for Other Services      Precautions / Restrictions Precautions Precautions: Fall Precaution Comments:  6 liters O2 at home       Mobility Bed Mobility Overal bed mobility: Needs Assistance Bed Mobility: Supine to Sit     Supine to sit: Supervision        Transfers Overall transfer level: Needs assistance Equipment used: 1 person hand held assist Transfers: Sit to/from Stand;Stand Pivot Transfers Sit to Stand: Min assist;Supervision Stand pivot transfers: Supervision                ADL either performed or assessed with clinical judgement   ADL Overall ADL's : Needs assistance/impaired         Upper Body Bathing: Set up;Sitting   Lower Body Bathing: Set up;Sit to/from stand;Cueing for safety   Upper Body Dressing : Set up;Sitting   Lower Body Dressing: Set up;Sit to/from stand   Toilet Transfer: BSC;Supervision/safety   Toileting- Water quality scientist and Hygiene: Supervision/safety;Sit to/from stand       Functional mobility during ADLs: Cueing for safety;Cueing for sequencing;Min guard General ADL Comments: pt fatigues easily but is very aware of her abilities               Cognition Arousal/Alertness: Awake/alert Behavior During Therapy: WFL for tasks assessed/performed Overall Cognitive Status: Within Functional Limits for tasks assessed                                                Frequency  Min 2X/week        Progress Toward Goals  OT Goals(current goals can now be found in the care plan section)  Progress towards OT goals: Progressing toward goals     Plan Discharge plan remains appropriate       AM-PAC PT "6 Clicks" Daily Activity     Outcome Measure   Help from another person eating meals?: None Help from another person taking care of personal grooming?: A Little Help from another person toileting, which includes using toliet, bedpan, or urinal?: A Lot Help from another person bathing (including washing, rinsing, drying)?: A Lot Help from another person to put on and taking off regular upper body clothing?: A Little Help from another person to put on and taking off regular lower body clothing?: A Lot 6 Click Score: 16    End of Session Equipment Utilized During Treatment: Oxygen  OT Visit Diagnosis: Unsteadiness on feet (R26.81);Muscle weakness (generalized) (M62.81)   Activity Tolerance Patient tolerated treatment well;Patient limited by fatigue   Patient Left in chair;with call bell/phone within reach;with family/visitor present   Nurse Communication Mobility status        Time: 1257-1316 OT Time Calculation (min): 19 min  Charges: OT General Charges $OT Visit: 1 Visit OT Treatments $Self Care/Home Management : 8-22 mins  Kari Baars, OT  [phone]   Payton Mccallum D 07/27/2017, 1:21 PM

## 2017-07-27 NOTE — Plan of Care (Signed)
Pt ready for discharge

## 2017-07-27 NOTE — Progress Notes (Signed)
Progress Note  Patient Name: Tamara Adams Date of Encounter: 07/27/2017  Primary Cardiologist: Nahser   Subjective  Breathing at baseline.  Mild abdominal pain.  Able to eat   Patient Profile       77 year old female with a history of severe COPD, hypertension, and recurrent small bowel obstructions.  She presented with a small bowel obstruction complicated by rapid atrial fibrillation and non-ST segment elevation myocardial infarction.  She is been treated medically.  She is thought to be of relatively poor candidate for invasive or interventional cardiology procedures.  She has converted back to sinus rhythm.  She has not had any further episodes of shortness of breath.  She is not having any chest pain.  Her left ventricular systolic function is normal by echocardiogram.  Inpatient Medications    Scheduled Meds: . amoxicillin-clavulanate  1 tablet Oral Q12H  . aspirin  81 mg Oral Daily  . enoxaparin (LOVENOX) injection  30 mg Subcutaneous Q24H  . fluticasone furoate-vilanterol  1 puff Inhalation Daily  . lactose free nutrition  237 mL Oral TID WC  . levalbuterol  0.63 mg Nebulization TID  . losartan  50 mg Oral Daily  . mouth rinse  15 mL Mouth Rinse BID  . metoprolol tartrate  50 mg Oral BID  . pantoprazole  40 mg Oral BID  . polyethylene glycol  17 g Oral Daily  . trimethoprim-polymyxin b  1 drop Both Eyes Q6H  . umeclidinium bromide  1 puff Inhalation Daily   Continuous Infusions: . sodium chloride 10 mL/hr at 07/26/17 0600   PRN Meds: acetaminophen **OR** acetaminophen, bisacodyl, hydrALAZINE, HYDROmorphone (DILAUDID) injection, levalbuterol, lidocaine, nitroGLYCERIN, [DISCONTINUED] ondansetron **OR** ondansetron (ZOFRAN) IV, phenol, promethazine   Vital Signs    Vitals:   07/26/17 1457 07/26/17 1509 07/26/17 2025 07/27/17 0456  BP:  133/62 (!) 151/70 (!) 141/61  Pulse:  93 (!) 103 92  Resp:  20 (!) 22 20  Temp:  98.4 F (36.9 C) 98.2 F (36.8 C) 98.4 F  (36.9 C)  TempSrc:  Oral Oral Oral  SpO2: 99% 99% 100% 97%  Weight:      Height:        Intake/Output Summary (Last 24 hours) at 07/27/2017 0845 Last data filed at 07/27/2017 0600 Gross per 24 hour  Intake 950 ml  Output 2 ml  Net 948 ml   Filed Weights   07/18/17 2020 07/19/17 0251 07/23/17 0346  Weight: 88 lb (39.9 kg) 84 lb 7 oz (38.3 kg) 85 lb 15.7 oz (39 kg)    Telemetry    NSR   with nonsustained atrial tachycardia personally Reviewed  ECG     Physical Exam   GEN:  Frail, elderly female, no acute distress wearing oxygen Neck:  8-10 Cardiac:  Regular rate and rhythm.  Respiratory:  Significantly decreased breath sounds GI: Soft,   MS: No edema; No deformity. Neuro:  Nonfocal  Psych: Normal affect     Labs    Chemistry Recent Labs  Lab 07/25/17 0613 07/26/17 0312 07/27/17 0525  NA 138 134* 137  K 3.1* 3.8 3.7  CL 101 102 103  CO2 31 27 28   GLUCOSE 110* 93 98  BUN 14 10 12   CREATININE 0.67 0.55 0.43*  CALCIUM 7.7* 7.6* 7.6*  GFRNONAA >60 >60 >60  GFRAA >60 >60 >60  ANIONGAP 6 5 6      Hematology Recent Labs  Lab 07/23/17 0323 07/24/17 0342 07/24/17 1405  WBC 11.3* 13.4* 8.7  RBC  2.93* 3.34* 3.25*  HGB 8.3* 9.6* 9.0*  HCT 26.3* 29.8* 28.8*  MCV 89.8 89.2 88.6  MCH 28.3 28.7 27.7  MCHC 31.6 32.2 31.3  RDW 15.5 15.6* 15.3  PLT 288 295 263    Cardiac Enzymes Recent Labs  Lab 07/22/17 0313 07/23/17 0919 07/23/17 1453 07/23/17 2026  TROPONINI 6.31* 2.57* 1.98* 1.95*   No results for input(s): TROPIPOC in the last 168 hours.   BNPNo results for input(s): BNP, PROBNP in the last 168 hours.   DDimer No results for input(s): DDIMER in the last 168 hours.   Radiology    No results found.  Cardiac Studies       Assessment & Plan    Coronary artery disease.   non ST segment elevation myocardial infarction.     Tachycardia  Hypertension  Atrial fibrillation secondary   Would use plavix x 1 month  Start today  Can be  reviewed by Dr Lurena Nida at followup BP mildly elevated  Continue current meds   Agree would not anticoagulate  Recent work shows lower benefit and higher risk of bleeding in pts anticoagulated for secondary AFib  \ adischarge anticipated   Will sign off call for further questions          Signed, Virl Axe, MD  07/27/2017, 8:45 AM

## 2017-07-29 DIAGNOSIS — I481 Persistent atrial fibrillation: Secondary | ICD-10-CM | POA: Diagnosis not present

## 2017-07-29 DIAGNOSIS — E43 Unspecified severe protein-calorie malnutrition: Secondary | ICD-10-CM | POA: Diagnosis not present

## 2017-07-29 DIAGNOSIS — I214 Non-ST elevation (NSTEMI) myocardial infarction: Secondary | ICD-10-CM | POA: Diagnosis not present

## 2017-07-29 DIAGNOSIS — Z9981 Dependence on supplemental oxygen: Secondary | ICD-10-CM | POA: Diagnosis not present

## 2017-07-29 DIAGNOSIS — J449 Chronic obstructive pulmonary disease, unspecified: Secondary | ICD-10-CM | POA: Diagnosis not present

## 2017-07-29 DIAGNOSIS — I10 Essential (primary) hypertension: Secondary | ICD-10-CM | POA: Diagnosis not present

## 2017-07-30 ENCOUNTER — Telehealth: Payer: Self-pay | Admitting: Family Medicine

## 2017-07-30 DIAGNOSIS — I10 Essential (primary) hypertension: Secondary | ICD-10-CM | POA: Diagnosis not present

## 2017-07-30 DIAGNOSIS — I481 Persistent atrial fibrillation: Secondary | ICD-10-CM | POA: Diagnosis not present

## 2017-07-30 DIAGNOSIS — J449 Chronic obstructive pulmonary disease, unspecified: Secondary | ICD-10-CM | POA: Diagnosis not present

## 2017-07-30 DIAGNOSIS — E43 Unspecified severe protein-calorie malnutrition: Secondary | ICD-10-CM | POA: Diagnosis not present

## 2017-07-30 DIAGNOSIS — Z9981 Dependence on supplemental oxygen: Secondary | ICD-10-CM | POA: Diagnosis not present

## 2017-07-30 DIAGNOSIS — I214 Non-ST elevation (NSTEMI) myocardial infarction: Secondary | ICD-10-CM | POA: Diagnosis not present

## 2017-07-30 NOTE — Telephone Encounter (Signed)
Agree with below. Beatrice Lecher, MD

## 2017-07-30 NOTE — Progress Notes (Signed)
Patient likely has chronic severe protein calorie malnutrition.

## 2017-07-30 NOTE — Telephone Encounter (Signed)
Requested PT orders for 2x week for 4 weeks. Verbal given.

## 2017-08-01 DIAGNOSIS — I214 Non-ST elevation (NSTEMI) myocardial infarction: Secondary | ICD-10-CM | POA: Diagnosis not present

## 2017-08-01 DIAGNOSIS — Z9981 Dependence on supplemental oxygen: Secondary | ICD-10-CM | POA: Diagnosis not present

## 2017-08-01 DIAGNOSIS — J449 Chronic obstructive pulmonary disease, unspecified: Secondary | ICD-10-CM | POA: Diagnosis not present

## 2017-08-01 DIAGNOSIS — I10 Essential (primary) hypertension: Secondary | ICD-10-CM | POA: Diagnosis not present

## 2017-08-01 DIAGNOSIS — E43 Unspecified severe protein-calorie malnutrition: Secondary | ICD-10-CM | POA: Diagnosis not present

## 2017-08-01 DIAGNOSIS — I481 Persistent atrial fibrillation: Secondary | ICD-10-CM | POA: Diagnosis not present

## 2017-08-02 DIAGNOSIS — J449 Chronic obstructive pulmonary disease, unspecified: Secondary | ICD-10-CM | POA: Diagnosis not present

## 2017-08-02 DIAGNOSIS — I10 Essential (primary) hypertension: Secondary | ICD-10-CM | POA: Diagnosis not present

## 2017-08-02 DIAGNOSIS — E43 Unspecified severe protein-calorie malnutrition: Secondary | ICD-10-CM | POA: Diagnosis not present

## 2017-08-02 DIAGNOSIS — I214 Non-ST elevation (NSTEMI) myocardial infarction: Secondary | ICD-10-CM | POA: Diagnosis not present

## 2017-08-02 DIAGNOSIS — Z9981 Dependence on supplemental oxygen: Secondary | ICD-10-CM | POA: Diagnosis not present

## 2017-08-02 DIAGNOSIS — I481 Persistent atrial fibrillation: Secondary | ICD-10-CM | POA: Diagnosis not present

## 2017-08-05 ENCOUNTER — Ambulatory Visit (INDEPENDENT_AMBULATORY_CARE_PROVIDER_SITE_OTHER): Payer: Medicare Other | Admitting: Family Medicine

## 2017-08-05 ENCOUNTER — Encounter: Payer: Self-pay | Admitting: Family Medicine

## 2017-08-05 VITALS — BP 138/58 | HR 80 | Temp 98.6°F | Ht 64.0 in | Wt 89.5 lb

## 2017-08-05 DIAGNOSIS — M5136 Other intervertebral disc degeneration, lumbar region: Secondary | ICD-10-CM | POA: Diagnosis not present

## 2017-08-05 DIAGNOSIS — N179 Acute kidney failure, unspecified: Secondary | ICD-10-CM

## 2017-08-05 DIAGNOSIS — K56609 Unspecified intestinal obstruction, unspecified as to partial versus complete obstruction: Secondary | ICD-10-CM | POA: Diagnosis not present

## 2017-08-05 DIAGNOSIS — J9611 Chronic respiratory failure with hypoxia: Secondary | ICD-10-CM | POA: Diagnosis not present

## 2017-08-05 DIAGNOSIS — R7989 Other specified abnormal findings of blood chemistry: Secondary | ICD-10-CM | POA: Diagnosis not present

## 2017-08-05 DIAGNOSIS — G894 Chronic pain syndrome: Secondary | ICD-10-CM | POA: Diagnosis not present

## 2017-08-05 DIAGNOSIS — R1084 Generalized abdominal pain: Secondary | ICD-10-CM

## 2017-08-05 DIAGNOSIS — R790 Abnormal level of blood mineral: Secondary | ICD-10-CM | POA: Diagnosis not present

## 2017-08-05 DIAGNOSIS — E538 Deficiency of other specified B group vitamins: Secondary | ICD-10-CM | POA: Diagnosis not present

## 2017-08-05 DIAGNOSIS — M51369 Other intervertebral disc degeneration, lumbar region without mention of lumbar back pain or lower extremity pain: Secondary | ICD-10-CM

## 2017-08-05 MED ORDER — METOPROLOL TARTRATE 50 MG PO TABS: 50.0000 mg | ORAL_TABLET | Freq: Two times a day (BID) | ORAL | 2 refills | Status: DC

## 2017-08-05 MED ORDER — CLOPIDOGREL BISULFATE 75 MG PO TABS: ORAL_TABLET | ORAL | 1 refills | Status: DC

## 2017-08-05 MED ORDER — CYANOCOBALAMIN 1000 MCG/ML IJ SOLN
1000.0000 ug | Freq: Once | INTRAMUSCULAR | Status: AC
  Administered 2017-08-05: 1000 ug via INTRAMUSCULAR

## 2017-08-05 MED ORDER — HYDROCODONE-ACETAMINOPHEN 10-325 MG PO TABS: 1.0000 | ORAL_TABLET | Freq: Four times a day (QID) | ORAL | 0 refills | Status: DC | PRN

## 2017-08-05 NOTE — Progress Notes (Signed)
Subjective:    Patient ID: Tamara Adams, female    DOB: May 09, 1941, 77 y.o.   MRN: [id number]  HPI 77 year old female comes in today for hospital follow-up.  She has a history of multiple small bowel obstructions with multiple prior surgeries.  She has residual chronic abdominal pain with quite advanced COPD with chronic hypoxemia.  She is on chronic oxygen at home.  She presented to the emergency department on December 27 nausea and abdominal pain that was increased.  While she was hospitalized she had sudden onset of atrial fibrillation with RVR and a non-ST elevation MI.  She was evaluated by cardiology.  Eventually the small bowel obstruction resolved on its own without any surgical intervention.  In regards to the atrial fibrillation her metoprolol was increased to 50 mg twice a day.  They chose not to anticoagulate her.  Her thyroid was normal.  In regards to the non-ST elevation MI she was started on Plavix by cardiology.  She also developed a fever while there and was felt to possibly have aspiration pneumonia so they had given her Augmentin.  Was discharged home on the fifth with about 3 more days of Augmentin to complete on her own at home.   She says her right shoulder starting hurting while in the hospital. She has been using heat and a TENs unit for the pain.   Review of Systems  BP (!) 138/58   Pulse 80   Ht 5\' 4"  (1.626 m)   Wt 89 lb 8 oz (40.6 kg)   SpO2 96% Comment: 8L  BMI 15.36 kg/m     Allergies  Allergen Reactions  . Maxidex [Dexamethasone] Other (See Comments)    DEHYDRATION AND HEART RACING  . Advair Diskus [Fluticasone-Salmeterol] Other (See Comments)    No benefit with lungs (INEFFECTIVE)  . Remeron [Mirtazapine] Other (See Comments)    CAUSED NIGHTMARES  . Trazodone And Nefazodone Other (See Comments)    Heart pounding  . Tylox [Oxycodone-Acetaminophen] Itching    Past Medical History:  Diagnosis Date  . Anemia    as a child  . Arthritis    "hands"  (12/24/2014)  . Complication of anesthesia    " My blood gas dropped during surgery so I was left on a ventilator and in ICU for 3 days."  . COPD (chronic obstructive pulmonary disease) ([facility])    FVC 72%, FEV1 29%, FEV1 ratio 32% (very severe (COPD)  . COPD (chronic obstructive pulmonary disease) ([place])   . Degenerative disc disease   . Depression with anxiety   . Diverticulosis   . DVT (deep venous thrombosis) ([place])    "LLE; years ago after a surgery"  . Essential hypertension   . Fluttering sensation of heart    pt put on metoprolol as a result  . GERD (gastroesophageal reflux disease)    PMH  . H/O hiatal hernia   . Headache    "maybe weekly" (12/24/2014)  . Hyperlipidemia   . Hypertension   . Kidney stone   . Liver lesion   . On home oxygen therapy    "3L; sleep w/it; use it when I rest" (12/24/2014)  . Peptic ulcer   . Pinched nerve    in back  . Pneumonia   . PONV (postoperative nausea and vomiting)   . Shortness of breath dyspnea    with exertion  . Skipped heart beats   . Small bowel obstruction ([place])    "several times; OR twice" (12/24/2014)  .  Tubular adenoma of colon 09/2011  . Wears glasses     Past Surgical History:  Procedure Laterality Date  . ABDOMINAL ADHESION SURGERY  12/24/2014  . ABDOMINAL HYSTERECTOMY  1987   and one ovary  . CATARACT EXTRACTION W/ INTRAOCULAR LENS  IMPLANT, BILATERAL Right 06/22/2012   Dr. Lester Kinsman.   Marland Kitchen CATARACT EXTRACTION W/PHACO Left 06/12/2012   Dr. Boykin Reaper  . CHOLECYSTECTOMY N/A 08/18/2013   Procedure: LAPAROSCOPIC CHOLECYSTECTOMY;  Surgeon: Harl Bowie, MD;  Location: [facility];  Service: General;  Laterality: N/A;  . DILATION AND CURETTAGE OF UTERUS    . EXPLORATORY LAPAROTOMY  12/24/2014  . HERNIA REPAIR    . INCISIONAL HERNIA REPAIR  12/24/2014  . INCISIONAL HERNIA REPAIR N/A 12/24/2014   Procedure: HERNIA REPAIR INCISIONAL;  Surgeon: Coralie Keens, MD;  Location: [facility];  Service: General;  Laterality: N/A;  . LAPAROTOMY  N/A 12/24/2014   Procedure: EXPLORATORY LAPAROTOMY;  Surgeon: Coralie Keens, MD;  Location: [facility];  Service: General;  Laterality: N/A;  . LYSIS OF ADHESION N/A 12/24/2014   Procedure: LYSIS OF ADHESION;  Surgeon: Coralie Keens, MD;  Location: [facility];  Service: General;  Laterality: N/A;  . MULTIPLE TOOTH EXTRACTIONS    . OOPHORECTOMY  1992  . SMALL INTESTINE SURGERY     for a blockage, no bowel was removed, just kinked from scar tissue  . TUBAL LIGATION    . URETHRAL DILATION      Social History   Socioeconomic History  . Marital status: Married    Spouse name: Not on file  . Number of children: 3  . Years of education: Not on file  . Highest education level: Not on file  Social Needs  . Financial resource strain: Not on file  . Food insecurity - worry: Not on file  . Food insecurity - inability: Not on file  . Transportation needs - medical: Not on file  . Transportation needs - non-medical: Not on file  Occupational History  . Occupation: retired.    Tobacco Use  . Smoking status: Former Smoker    Packs/day: 0.25    Years: 57.00    Pack years: 14.25    Types: Cigarettes    Last attempt to quit: 07/25/2015    Years since quitting: 2.0  . Smokeless tobacco: Never Used  . Tobacco comment: 5 cigarettes a day  Substance and Sexual Activity  . Alcohol use: No    Alcohol/week: 0.0 oz  . Drug use: No  . Sexual activity: No  Other Topics Concern  . Not on file  Social History Narrative   No regular exercise.     Family History  Problem Relation Age of Onset  . Hypertension Mother   . Ovarian cancer Mother   . Glaucoma Mother   . Glaucoma Sister   . Colon cancer Neg Hx   . Colon polyps Neg Hx   . Diabetes Neg Hx   . Kidney disease Neg Hx   . Gallbladder disease Neg Hx   . Esophageal cancer Neg Hx     Outpatient Encounter Medications as of 08/05/2017  Medication Sig  . acetaminophen (TYLENOL) 325 MG tablet Take 325-650 mg by mouth every 6 (six) hours as needed  for headache.  . albuterol (PROVENTIL) (2.5 MG/3ML) 0.083% nebulizer solution USE 1 VIAL (3 ML) VIA NEBULIZER EVERY 6 HOURS AS NEEDED FOR WHEEZING OR SHORTNESS OF BREATH  . AMBULATORY NON FORMULARY MEDICATION Medication Name: Needs oxygen concentrator set to 6 liter  per minutes as she has a long tubing on her machine.  Fax to [employer] [facility]  . AMBULATORY NON FORMULARY MEDICATION Medication Name: GI cocktail: 125mL Lidocaine2%, 164ml Maalox, 68ml Donnatol. Ok to take twice daily.  . AMBULATORY NON FORMULARY MEDICATION Wheel Chair  . AMBULATORY NON FORMULARY MEDICATION PT and OT Evaluation and Treatment.  . clopidogrel (PLAVIX) 75 MG tablet Take 1 tablet (75 mg total) by mouth daily.  Marland Kitchen dronabinol (MARINOL) 2.5 MG capsule Take 1 capsule (2.5 mg total) by mouth 2 (two) times daily.  . fluticasone furoate-vilanterol (BREO ELLIPTA) 100-25 MCG/INH AEPB Inhale 1 puff into the lungs daily.  Marland Kitchen guaiFENesin (MUCINEX) 600 MG 12 hr tablet Take 1 tablet (600 mg total) by mouth 2 (two) times daily.  Marland Kitchen HYDROcodone-acetaminophen (NORCO) 10-325 MG tablet Take 1 tablet every 6 (six) hours as needed by mouth. OK to fill 06/18/17 (Patient taking differently: Take 1 tablet by mouth every 6 (six) hours as needed for moderate pain. OK to fill 06/18/17)  . INCRUSE ELLIPTA 62.5 MCG/INH AEPB USE 1 INHALATION DAILY  . lidocaine (XYLOCAINE) 2 % solution Use as directed 20 mLs in the mouth or throat 2 (two) times daily as needed. (Patient taking differently: Use as directed 20 mLs in the mouth or throat 2 (two) times daily as needed for mouth pain. )  . losartan (COZAAR) 100 MG tablet TAKE ONE-HALF (1/2) TABLET DAILY (Patient taking differently: TAKE50 MG TABLET DAILY)  . metoprolol tartrate (LOPRESSOR) 50 MG tablet Take 1 tablet (50 mg total) by mouth 2 (two) times daily.  . Multiple Vitamin (MULTIVITAMIN WITH MINERALS) TABS tablet Take 1 tablet by mouth daily. Pt uses One-A-Day brand  . nystatin (MYCOSTATIN) 100000 UNIT/ML suspension  Take 5 mLs (500,000 Units total) by mouth 4 (four) times daily.  Marland Kitchen omeprazole (PRILOSEC) 40 MG capsule Take 1 capsule (40 mg total) by mouth 2 (two) times daily.  . ondansetron (ZOFRAN-ODT) 8 MG disintegrating tablet PLACE 1 TABLET ON THE TONGUE EVERY 8 HOURS AS NEEDED FOR NAUSEA  . polyethylene glycol (MIRALAX / GLYCOLAX) packet Take 17 g by mouth daily.  . promethazine (PHENERGAN) 25 MG tablet Take 1 tablet (25 mg total) by mouth every 8 (eight) hours as needed for nausea or vomiting.  . ranitidine (ZANTAC) 150 MG tablet Take 1 tablet (150 mg total) by mouth daily.  . traMADol (ULTRAM) 50 MG tablet Take 1 tablet (50 mg total) by mouth every 6 (six) hours as needed (pain).  Marland Kitchen trimethoprim-polymyxin b (POLYTRIM) ophthalmic solution One drop in effected eye every 6 hours for 7 days for conjunctivitis.   No facility-administered encounter medications on file as of 08/05/2017.          Objective:   Physical Exam  Constitutional: She is oriented to person, place, and time. She appears well-developed and well-nourished.  HENT:  Head: Normocephalic and atraumatic.  Cardiovascular: Normal rate, regular rhythm and normal heart sounds.  Pulmonary/Chest: Effort normal and breath sounds normal.  Neurological: She is alert and oriented to person, place, and time.  Skin: Skin is warm and dry.  Psychiatric: She has a normal mood and affect. Her behavior is normal.   Right shoulder with normal range of motion and strength in all directions.  She is tender just on the outside of the shoulder laterally.  Nontender over the [facility] joint.     Assessment & Plan:  Small bowel obstruction-resolved.  Atrial fibrillation with RVR-on metoprolol for rate control.  Pulse is in the 80s so well  controlled.  Non-ST elevation MI-currently on Plavix.  Recheck CBC.  COPD with chronic respiratory failure-continue 7 L home oxygen.  Acute kidney injury-due to recheck BUN and creatinine.  Right shoulder pain -possible  bursitis.  Will give exercises to do on her own at home.  If not improving then please let me know.

## 2017-08-06 ENCOUNTER — Encounter: Payer: Self-pay | Admitting: Cardiovascular Disease

## 2017-08-06 DIAGNOSIS — Z9981 Dependence on supplemental oxygen: Secondary | ICD-10-CM | POA: Diagnosis not present

## 2017-08-06 DIAGNOSIS — I214 Non-ST elevation (NSTEMI) myocardial infarction: Secondary | ICD-10-CM | POA: Diagnosis not present

## 2017-08-06 DIAGNOSIS — J449 Chronic obstructive pulmonary disease, unspecified: Secondary | ICD-10-CM | POA: Diagnosis not present

## 2017-08-06 DIAGNOSIS — I10 Essential (primary) hypertension: Secondary | ICD-10-CM | POA: Diagnosis not present

## 2017-08-06 DIAGNOSIS — E43 Unspecified severe protein-calorie malnutrition: Secondary | ICD-10-CM | POA: Diagnosis not present

## 2017-08-06 DIAGNOSIS — I481 Persistent atrial fibrillation: Secondary | ICD-10-CM | POA: Diagnosis not present

## 2017-08-07 ENCOUNTER — Ambulatory Visit (INDEPENDENT_AMBULATORY_CARE_PROVIDER_SITE_OTHER): Payer: Medicare Other | Admitting: Cardiovascular Disease

## 2017-08-07 ENCOUNTER — Encounter: Payer: Self-pay | Admitting: Cardiovascular Disease

## 2017-08-07 ENCOUNTER — Ambulatory Visit: Payer: Medicare Other

## 2017-08-07 VITALS — BP 154/66 | HR 71 | Ht 64.0 in | Wt 83.0 lb

## 2017-08-07 DIAGNOSIS — I214 Non-ST elevation (NSTEMI) myocardial infarction: Secondary | ICD-10-CM | POA: Diagnosis not present

## 2017-08-07 DIAGNOSIS — I480 Paroxysmal atrial fibrillation: Secondary | ICD-10-CM | POA: Diagnosis not present

## 2017-08-07 LAB — CBC
HCT: 24.4 % — ABNORMAL LOW (ref 35.0–45.0)
Hemoglobin: 8.1 g/dL — ABNORMAL LOW (ref 11.7–15.5)
MCH: 27.4 pg (ref 27.0–33.0)
MCHC: 33.2 g/dL (ref 32.0–36.0)
MCV: 82.4 fL (ref 80.0–100.0)
MPV: 8.5 fL (ref 7.5–12.5)
PLATELETS: 947 10*3/uL — AB (ref 140–400)
RBC: 2.96 10*6/uL — AB (ref 3.80–5.10)
RDW: 13.6 % (ref 11.0–15.0)
WBC: 11.8 10*3/uL — AB (ref 3.8–10.8)

## 2017-08-07 LAB — BASIC METABOLIC PANEL WITH GFR
BUN: 10 mg/dL (ref 7–25)
CO2: 33 mmol/L — ABNORMAL HIGH (ref 20–32)
CREATININE: 0.72 mg/dL (ref 0.60–0.93)
Calcium: 8.1 mg/dL — ABNORMAL LOW (ref 8.6–10.4)
Chloride: 95 mmol/L — ABNORMAL LOW (ref 98–110)
GFR, Est African American: 94 mL/min/{1.73_m2} (ref >=60)
GFR, Est Non African American: 81 mL/min/{1.73_m2} (ref >=60)
GLUCOSE: 99 mg/dL (ref 65–99)
POTASSIUM: 5 mmol/L (ref 3.5–5.3)
Sodium: 133 mmol/L — ABNORMAL LOW (ref 135–146)

## 2017-08-07 LAB — FERRITIN: FERRITIN: 60 ng/mL (ref 20–288)

## 2017-08-07 MED ORDER — LOSARTAN POTASSIUM 50 MG PO TABS: 50.0000 mg | ORAL_TABLET | Freq: Every day | ORAL | 3 refills | Status: DC

## 2017-08-07 NOTE — Progress Notes (Signed)
Cardiology Office Note:    Date:  08/07/2017   ID:  Tamara Adams, DOB 1941/05/10, MRN [id number]  PCP:  [name], MD  Cardiologist:  Mertie Moores, MD   Referring MD: [name], *   Chief Complaint  Patient presents with  . Coronary Artery Disease    Problem list 1.  Severe COPD 2.  Hypertension 3.  Recurrent small bowel obstructions 4.  Non-ST segment elevation myocardial infarction 5.  Paroxysmal atrial fibrillation-in the setting of small bowel obstruction   History of Present Illness:    Tamara Adams is a 76 y.o. female with a hx of severe COPD, recurrent small bowel obstructions, hypertension who was admitted to the hospital in December with a recurrent small bowel obstruction.  She had a non-ST segment elevation myocardial infarction related to the stress of the small bowel obstruction.  She also developed paroxysmal atrial fibrillation.  She converted back to sinus rhythm on diltiazem.  No CP .   Has had some chest tightness ( has a cold and COPD)   Past Medical History:  Diagnosis Date  . Anemia    as a child  . Arthritis    "hands" (12/24/2014)  . Complication of anesthesia    " My blood gas dropped during surgery so I was left on a ventilator and in ICU for 3 days."  . COPD (chronic obstructive pulmonary disease) (HCC)    FVC 72%, FEV1 29%, FEV1 ratio 32% (very severe (COPD)  . COPD (chronic obstructive pulmonary disease) ([place])   . Degenerative disc disease   . Depression with anxiety   . Diverticulosis   . DVT (deep venous thrombosis) ([place])    "LLE; years ago after a surgery"  . Essential hypertension   . Fluttering sensation of heart    pt put on metoprolol as a result  . GERD (gastroesophageal reflux disease)    PMH  . H/O hiatal hernia   . Headache    "maybe weekly" (12/24/2014)  . Hyperlipidemia   . Hypertension   . Kidney stone   . Liver lesion   . On home oxygen therapy    "3L; sleep w/it; use it when I rest" (12/24/2014)    . Peptic ulcer   . Pinched nerve    in back  . Pneumonia   . PONV (postoperative nausea and vomiting)   . Shortness of breath dyspnea    with exertion  . Skipped heart beats   . Small bowel obstruction ([place])    "several times; OR twice" (12/24/2014)  . Tubular adenoma of colon 09/2011  . Wears glasses     Past Surgical History:  Procedure Laterality Date  . ABDOMINAL ADHESION SURGERY  12/24/2014  . ABDOMINAL HYSTERECTOMY  1987   and one ovary  . CATARACT EXTRACTION W/ INTRAOCULAR LENS  IMPLANT, BILATERAL Right 06/22/2012   Dr. Lester Kinsman.   Marland Kitchen CATARACT EXTRACTION W/PHACO Left 06/12/2012   Dr. Boykin Reaper  . CHOLECYSTECTOMY N/A 08/18/2013   Procedure: LAPAROSCOPIC CHOLECYSTECTOMY;  Surgeon: Harl Bowie, MD;  Location: [place];  Service: General;  Laterality: N/A;  . DILATION AND CURETTAGE OF UTERUS    . EXPLORATORY LAPAROTOMY  12/24/2014  . HERNIA REPAIR    . INCISIONAL HERNIA REPAIR  12/24/2014  . INCISIONAL HERNIA REPAIR N/A 12/24/2014   Procedure: HERNIA REPAIR INCISIONAL;  Surgeon: Coralie Keens, MD;  Location: [place];  Service: General;  Laterality: N/A;  . LAPAROTOMY N/A 12/24/2014   Procedure: EXPLORATORY LAPAROTOMY;  Surgeon: Coralie Keens, MD;  Location: [place];  Service: General;  Laterality: N/A;  . LYSIS OF ADHESION N/A 12/24/2014   Procedure: LYSIS OF ADHESION;  Surgeon: Coralie Keens, MD;  Location: [place];  Service: General;  Laterality: N/A;  . MULTIPLE TOOTH EXTRACTIONS    . OOPHORECTOMY  1992  . SMALL INTESTINE SURGERY     for a blockage, no bowel was removed, just kinked from scar tissue  . TUBAL LIGATION    . URETHRAL DILATION      Current Medications: Current Meds  Medication Sig  . acetaminophen (TYLENOL) 325 MG tablet Take 325-650 mg by mouth every 6 (six) hours as needed for headache.  . albuterol (PROVENTIL) (2.5 MG/3ML) 0.083% nebulizer solution USE 1 VIAL (3 ML) VIA NEBULIZER EVERY 6 HOURS AS NEEDED FOR WHEEZING OR SHORTNESS OF BREATH  .  AMBULATORY NON FORMULARY MEDICATION Medication Name: Needs oxygen concentrator set to 6 liter per minutes as she has a long tubing on her machine.  Fax to [employer] [facility]  . AMBULATORY NON FORMULARY MEDICATION Medication Name: GI cocktail: 141mL Lidocaine2%, 131ml Maalox, 18ml Donnatol. Ok to take twice daily.  . clopidogrel (PLAVIX) 75 MG tablet Take 1 tablet by mouth daily  . dronabinol (MARINOL) 2.5 MG capsule Take 1 capsule (2.5 mg total) by mouth 2 (two) times daily.  . fluticasone furoate-vilanterol (BREO ELLIPTA) 100-25 MCG/INH AEPB Inhale 1 puff into the lungs daily.  Marland Kitchen guaiFENesin (MUCINEX) 600 MG 12 hr tablet Take 1 tablet (600 mg total) by mouth 2 (two) times daily.  Marland Kitchen HYDROcodone-acetaminophen (NORCO) 10-325 MG tablet Take 1 tablet by mouth every 6 (six) hours as needed. (Patient taking differently: Take 1 tablet by mouth every 6 (six) hours as needed for moderate pain. )  . INCRUSE ELLIPTA 62.5 MCG/INH AEPB USE 1 INHALATION DAILY  . lidocaine (XYLOCAINE) 2 % solution Use as directed 20 mLs in the mouth or throat 2 (two) times daily as needed. (Patient taking differently: Use as directed 20 mLs in the mouth or throat 2 (two) times daily as needed for mouth pain. )  . metoprolol tartrate (LOPRESSOR) 50 MG tablet Take 1 tablet (50 mg total) by mouth 2 (two) times daily.  . Multiple Vitamin (MULTIVITAMIN WITH MINERALS) TABS tablet Take 1 tablet by mouth daily. Pt uses One-A-Day brand  . omeprazole (PRILOSEC) 40 MG capsule Take 1 capsule (40 mg total) by mouth 2 (two) times daily.  . ondansetron (ZOFRAN-ODT) 8 MG disintegrating tablet PLACE 1 TABLET ON THE TONGUE EVERY 8 HOURS AS NEEDED FOR NAUSEA  . polyethylene glycol (MIRALAX / GLYCOLAX) packet Take 17 g by mouth daily.  . promethazine (PHENERGAN) 25 MG tablet Take 1 tablet (25 mg total) by mouth every 8 (eight) hours as needed for nausea or vomiting.  . ranitidine (ZANTAC) 150 MG tablet Take 1 tablet (150 mg total) by mouth daily.  . traMADol  (ULTRAM) 50 MG tablet Take 1 tablet (50 mg total) by mouth every 6 (six) hours as needed (pain).  . [DISCONTINUED] losartan (COZAAR) 100 MG tablet TAKE ONE-HALF (1/2) TABLET DAILY (Patient taking differently: TAKE50 MG TABLET DAILY)     Allergies:   Maxidex [dexamethasone]; Advair diskus [fluticasone-salmeterol]; Remeron [mirtazapine]; Trazodone and nefazodone; and Tylox [oxycodone-acetaminophen]   Social History   Socioeconomic History  . Marital status: Married    Spouse name: None  . Number of children: 3  . Years of education: None  . Highest education level: None  Social Needs  . Financial resource strain:  None  . Food insecurity - worry: None  . Food insecurity - inability: None  . Transportation needs - medical: None  . Transportation needs - non-medical: None  Occupational History  . Occupation: retired.    Tobacco Use  . Smoking status: Former Smoker    Packs/day: 0.25    Years: 57.00    Pack years: 14.25    Types: Cigarettes    Last attempt to quit: 07/25/2015    Years since quitting: 2.0  . Smokeless tobacco: Never Used  . Tobacco comment: 5 cigarettes a day  Substance and Sexual Activity  . Alcohol use: No    Alcohol/week: 0.0 oz  . Drug use: No  . Sexual activity: No  Other Topics Concern  . None  Social History Narrative   No regular exercise.      Family History: The patient's family history includes Glaucoma in her mother and sister; Hypertension in her mother; Ovarian cancer in her mother. There is no history of Colon cancer, Colon polyps, Diabetes, Kidney disease, Gallbladder disease, or Esophageal cancer.  ROS:   Please see the history of present illness.     All other systems reviewed and are negative.  EKGs/Labs/Other Studies Reviewed:    The following studies were reviewed today:  EKG:    Recent Labs: 07/18/2017: ALT 15 07/23/2017: TSH 0.462 07/24/2017: Magnesium 2.2 08/05/2017: BUN 10; Creat 0.72; Hemoglobin 8.1; Platelets 947; Potassium  5.0; Sodium 133  Recent Lipid Panel    Component Value Date/Time   CHOL 118 07/25/2017 0613   TRIG 130 07/25/2017 0613   TRIG 112 10/11/2010 0811   HDL 41 07/25/2017 0613   CHOLHDL 2.9 07/25/2017 0613   VLDL 26 07/25/2017 0613   LDLCALC 51 07/25/2017 0613   LDLCALC 165 10/11/2010 0811    Physical Exam:    VS:  BP (!) 154/66   Pulse 71   Ht 5\' 4"  (1.626 m)   Wt 83 lb (37.6 kg)   BMI 14.25 kg/m     Wt Readings from Last 3 Encounters:  08/07/17 83 lb (37.6 kg)  08/05/17 89 lb 8 oz (40.6 kg)  07/23/17 85 lb 15.7 oz (39 kg)     GEN:  Extremely thin, elderly, frail female.   Using home O2 - continuous flow at a high rate.   HEENT: Normal NECK: No JVD; No carotid bruits LYMPHATICS: No lymphadenopathy CARDIAC:RR, no murmurs, rubs, gallops RESPIRATORY:  Clear to auscultation without rales, wheezing or rhonchi  ABDOMEN: Soft, non-tender, non-distended MUSCULOSKELETAL:  No edema; No deformity  SKIN: Warm and dry NEUROLOGIC:  Alert and oriented x 3 PSYCHIATRIC:  Normal affect   ASSESSMENT:    No diagnosis found. PLAN:    In order of problems listed above:  1. NSTEMI:    Mrs. Bergsma is doing very well.  She has not had any recurrent episodes of chest pain she has had some congestion but also has had a cold.  She overall has done very well on conservative ( non-aggressive ) therapy  She is at very high risk for complications with any invasive / interventional procedures given her severe COPD.  2.  Paroxysmal atrial fibrillation: She is not had any recurrent episodes of atrial for ablation.  Clinically she sounds like she is in normal sinus rhythm today.   Medication Adjustments/Labs and Tests Ordered: Current medicines are reviewed at length with the patient today.  Concerns regarding medicines are outlined above.  No orders of the defined types were placed in  this encounter.  Meds ordered this encounter  Medications  . losartan (COZAAR) 50 MG tablet    Sig: Take 1  tablet (50 mg total) by mouth daily.    Dispense:  90 tablet    Refill:  3    Signed, Mertie Moores, MD  08/07/2017 12:19 PM    [place]

## 2017-08-07 NOTE — Patient Instructions (Signed)
Medication Instructions:  Your physician recommends that you continue on your current medications as directed. Please refer to the Current Medication list given to you today.   Labwork: None Ordered   Testing/Procedures: None Ordered   Follow-Up: Your physician wants you to follow-up in: 6 months with Dr. Nahser.  You will receive a reminder letter in the mail two months in advance. If you don't receive a letter, please call our office to schedule the follow-up appointment.   If you need a refill on your cardiac medications before your next appointment, please call your pharmacy.   Thank you for choosing [facility]! Suraiya Dickerson, RN [phone]    

## 2017-08-08 DIAGNOSIS — I10 Essential (primary) hypertension: Secondary | ICD-10-CM | POA: Diagnosis not present

## 2017-08-08 DIAGNOSIS — I481 Persistent atrial fibrillation: Secondary | ICD-10-CM | POA: Diagnosis not present

## 2017-08-08 DIAGNOSIS — J449 Chronic obstructive pulmonary disease, unspecified: Secondary | ICD-10-CM | POA: Diagnosis not present

## 2017-08-08 DIAGNOSIS — E43 Unspecified severe protein-calorie malnutrition: Secondary | ICD-10-CM | POA: Diagnosis not present

## 2017-08-08 DIAGNOSIS — Z9981 Dependence on supplemental oxygen: Secondary | ICD-10-CM | POA: Diagnosis not present

## 2017-08-08 DIAGNOSIS — I214 Non-ST elevation (NSTEMI) myocardial infarction: Secondary | ICD-10-CM | POA: Diagnosis not present

## 2017-08-09 ENCOUNTER — Other Ambulatory Visit: Payer: Self-pay | Admitting: Family Medicine

## 2017-08-09 DIAGNOSIS — D649 Anemia, unspecified: Secondary | ICD-10-CM

## 2017-08-09 DIAGNOSIS — Z9981 Dependence on supplemental oxygen: Secondary | ICD-10-CM | POA: Diagnosis not present

## 2017-08-09 DIAGNOSIS — I481 Persistent atrial fibrillation: Secondary | ICD-10-CM | POA: Diagnosis not present

## 2017-08-09 DIAGNOSIS — J449 Chronic obstructive pulmonary disease, unspecified: Secondary | ICD-10-CM | POA: Diagnosis not present

## 2017-08-09 DIAGNOSIS — I214 Non-ST elevation (NSTEMI) myocardial infarction: Secondary | ICD-10-CM | POA: Diagnosis not present

## 2017-08-09 DIAGNOSIS — E43 Unspecified severe protein-calorie malnutrition: Secondary | ICD-10-CM | POA: Diagnosis not present

## 2017-08-09 DIAGNOSIS — I10 Essential (primary) hypertension: Secondary | ICD-10-CM | POA: Diagnosis not present

## 2017-08-10 ENCOUNTER — Other Ambulatory Visit: Payer: Self-pay | Admitting: Family Medicine

## 2017-08-10 DIAGNOSIS — G894 Chronic pain syndrome: Secondary | ICD-10-CM

## 2017-08-10 DIAGNOSIS — M5136 Other intervertebral disc degeneration, lumbar region: Secondary | ICD-10-CM

## 2017-08-13 DIAGNOSIS — Z9981 Dependence on supplemental oxygen: Secondary | ICD-10-CM | POA: Diagnosis not present

## 2017-08-13 DIAGNOSIS — E43 Unspecified severe protein-calorie malnutrition: Secondary | ICD-10-CM | POA: Diagnosis not present

## 2017-08-13 DIAGNOSIS — I214 Non-ST elevation (NSTEMI) myocardial infarction: Secondary | ICD-10-CM | POA: Diagnosis not present

## 2017-08-13 DIAGNOSIS — J449 Chronic obstructive pulmonary disease, unspecified: Secondary | ICD-10-CM | POA: Diagnosis not present

## 2017-08-13 DIAGNOSIS — I10 Essential (primary) hypertension: Secondary | ICD-10-CM | POA: Diagnosis not present

## 2017-08-13 DIAGNOSIS — I481 Persistent atrial fibrillation: Secondary | ICD-10-CM | POA: Diagnosis not present

## 2017-08-14 ENCOUNTER — Other Ambulatory Visit: Payer: Self-pay | Admitting: *Deleted

## 2017-08-14 DIAGNOSIS — I481 Persistent atrial fibrillation: Secondary | ICD-10-CM | POA: Diagnosis not present

## 2017-08-14 DIAGNOSIS — I10 Essential (primary) hypertension: Secondary | ICD-10-CM | POA: Diagnosis not present

## 2017-08-14 DIAGNOSIS — Z9981 Dependence on supplemental oxygen: Secondary | ICD-10-CM | POA: Diagnosis not present

## 2017-08-14 DIAGNOSIS — E43 Unspecified severe protein-calorie malnutrition: Secondary | ICD-10-CM | POA: Diagnosis not present

## 2017-08-14 DIAGNOSIS — J449 Chronic obstructive pulmonary disease, unspecified: Secondary | ICD-10-CM | POA: Diagnosis not present

## 2017-08-14 DIAGNOSIS — I214 Non-ST elevation (NSTEMI) myocardial infarction: Secondary | ICD-10-CM | POA: Diagnosis not present

## 2017-08-14 MED ORDER — AMBULATORY NON FORMULARY MEDICATION: 3 refills | Status: DC

## 2017-08-16 DIAGNOSIS — Z9981 Dependence on supplemental oxygen: Secondary | ICD-10-CM | POA: Diagnosis not present

## 2017-08-16 DIAGNOSIS — I10 Essential (primary) hypertension: Secondary | ICD-10-CM | POA: Diagnosis not present

## 2017-08-16 DIAGNOSIS — I481 Persistent atrial fibrillation: Secondary | ICD-10-CM | POA: Diagnosis not present

## 2017-08-16 DIAGNOSIS — I214 Non-ST elevation (NSTEMI) myocardial infarction: Secondary | ICD-10-CM | POA: Diagnosis not present

## 2017-08-16 DIAGNOSIS — E43 Unspecified severe protein-calorie malnutrition: Secondary | ICD-10-CM | POA: Diagnosis not present

## 2017-08-16 DIAGNOSIS — J449 Chronic obstructive pulmonary disease, unspecified: Secondary | ICD-10-CM | POA: Diagnosis not present

## 2017-08-19 ENCOUNTER — Ambulatory Visit (HOSPITAL_COMMUNITY)
Admission: RE | Admit: 2017-08-19 | Discharge: 2017-08-19 | Disposition: A | Discharge status: Home / Self Care | Payer: Medicare Other | Source: Ambulatory Visit | Attending: Family Medicine | Admitting: Family Medicine

## 2017-08-19 ENCOUNTER — Encounter (HOSPITAL_COMMUNITY): Payer: Self-pay | Admitting: Radiology

## 2017-08-19 DIAGNOSIS — R9389 Abnormal findings on diagnostic imaging of other specified body structures: Secondary | ICD-10-CM | POA: Diagnosis not present

## 2017-08-19 DIAGNOSIS — R911 Solitary pulmonary nodule: Secondary | ICD-10-CM | POA: Insufficient documentation

## 2017-08-19 LAB — GLUCOSE, CAPILLARY: Glucose-Capillary: 93 mg/dL (ref 65–99)

## 2017-08-19 MED ORDER — FLUDEOXYGLUCOSE F - 18 (FDG) INJECTION
5.0000 | Freq: Once | INTRAVENOUS | Status: AC | PRN
  Administered 2017-08-19: 5 via INTRAVENOUS

## 2017-08-20 DIAGNOSIS — I481 Persistent atrial fibrillation: Secondary | ICD-10-CM | POA: Diagnosis not present

## 2017-08-20 DIAGNOSIS — I214 Non-ST elevation (NSTEMI) myocardial infarction: Secondary | ICD-10-CM | POA: Diagnosis not present

## 2017-08-20 DIAGNOSIS — E43 Unspecified severe protein-calorie malnutrition: Secondary | ICD-10-CM | POA: Diagnosis not present

## 2017-08-20 DIAGNOSIS — J449 Chronic obstructive pulmonary disease, unspecified: Secondary | ICD-10-CM | POA: Diagnosis not present

## 2017-08-20 DIAGNOSIS — Z9981 Dependence on supplemental oxygen: Secondary | ICD-10-CM | POA: Diagnosis not present

## 2017-08-20 DIAGNOSIS — I10 Essential (primary) hypertension: Secondary | ICD-10-CM | POA: Diagnosis not present

## 2017-08-22 ENCOUNTER — Other Ambulatory Visit: Payer: Self-pay | Admitting: Family Medicine

## 2017-08-22 MED ORDER — AZITHROMYCIN 250 MG PO TABS: ORAL_TABLET | ORAL | 0 refills | Status: AC

## 2017-08-26 ENCOUNTER — Other Ambulatory Visit: Payer: Self-pay | Admitting: Family

## 2017-08-26 DIAGNOSIS — D649 Anemia, unspecified: Secondary | ICD-10-CM

## 2017-08-27 ENCOUNTER — Inpatient Hospital Stay: Payer: Medicare Other

## 2017-08-27 ENCOUNTER — Other Ambulatory Visit: Payer: Self-pay

## 2017-08-27 ENCOUNTER — Inpatient Hospital Stay: Payer: Medicare Other | Attending: Family | Admitting: Family

## 2017-08-27 VITALS — BP 134/50 | HR 74

## 2017-08-27 DIAGNOSIS — M129 Arthropathy, unspecified: Secondary | ICD-10-CM | POA: Insufficient documentation

## 2017-08-27 DIAGNOSIS — K5900 Constipation, unspecified: Secondary | ICD-10-CM | POA: Diagnosis not present

## 2017-08-27 DIAGNOSIS — Z86718 Personal history of other venous thrombosis and embolism: Secondary | ICD-10-CM | POA: Insufficient documentation

## 2017-08-27 DIAGNOSIS — D509 Iron deficiency anemia, unspecified: Secondary | ICD-10-CM | POA: Diagnosis not present

## 2017-08-27 DIAGNOSIS — Z8041 Family history of malignant neoplasm of ovary: Secondary | ICD-10-CM | POA: Diagnosis not present

## 2017-08-27 DIAGNOSIS — Z90710 Acquired absence of both cervix and uterus: Secondary | ICD-10-CM | POA: Insufficient documentation

## 2017-08-27 DIAGNOSIS — Z87891 Personal history of nicotine dependence: Secondary | ICD-10-CM | POA: Diagnosis not present

## 2017-08-27 DIAGNOSIS — D508 Other iron deficiency anemias: Secondary | ICD-10-CM

## 2017-08-27 DIAGNOSIS — I252 Old myocardial infarction: Secondary | ICD-10-CM | POA: Diagnosis not present

## 2017-08-27 DIAGNOSIS — I498 Other specified cardiac arrhythmias: Secondary | ICD-10-CM | POA: Insufficient documentation

## 2017-08-27 DIAGNOSIS — D649 Anemia, unspecified: Secondary | ICD-10-CM

## 2017-08-27 DIAGNOSIS — Z79899 Other long term (current) drug therapy: Secondary | ICD-10-CM | POA: Diagnosis not present

## 2017-08-27 DIAGNOSIS — R5383 Other fatigue: Secondary | ICD-10-CM | POA: Insufficient documentation

## 2017-08-27 DIAGNOSIS — Z9049 Acquired absence of other specified parts of digestive tract: Secondary | ICD-10-CM | POA: Diagnosis not present

## 2017-08-27 DIAGNOSIS — I10 Essential (primary) hypertension: Secondary | ICD-10-CM | POA: Insufficient documentation

## 2017-08-27 DIAGNOSIS — R109 Unspecified abdominal pain: Secondary | ICD-10-CM | POA: Insufficient documentation

## 2017-08-27 DIAGNOSIS — Z8719 Personal history of other diseases of the digestive system: Secondary | ICD-10-CM

## 2017-08-27 DIAGNOSIS — R531 Weakness: Secondary | ICD-10-CM | POA: Insufficient documentation

## 2017-08-27 DIAGNOSIS — F418 Other specified anxiety disorders: Secondary | ICD-10-CM | POA: Insufficient documentation

## 2017-08-27 DIAGNOSIS — E785 Hyperlipidemia, unspecified: Secondary | ICD-10-CM

## 2017-08-27 DIAGNOSIS — K219 Gastro-esophageal reflux disease without esophagitis: Secondary | ICD-10-CM | POA: Insufficient documentation

## 2017-08-27 DIAGNOSIS — J449 Chronic obstructive pulmonary disease, unspecified: Secondary | ICD-10-CM | POA: Diagnosis not present

## 2017-08-27 DIAGNOSIS — Z8701 Personal history of pneumonia (recurrent): Secondary | ICD-10-CM | POA: Diagnosis not present

## 2017-08-27 DIAGNOSIS — R911 Solitary pulmonary nodule: Secondary | ICD-10-CM | POA: Diagnosis not present

## 2017-08-27 DIAGNOSIS — Z8711 Personal history of peptic ulcer disease: Secondary | ICD-10-CM | POA: Diagnosis not present

## 2017-08-27 DIAGNOSIS — Z87442 Personal history of urinary calculi: Secondary | ICD-10-CM | POA: Diagnosis not present

## 2017-08-27 LAB — CMP (CANCER CENTER ONLY)
ALK PHOS: 87 U/L (ref 40–150)
ALT: 9 U/L (ref 0–55)
ANION GAP: 10 (ref 3–11)
AST: 23 U/L (ref 5–34)
Albumin: 3.3 g/dL — ABNORMAL LOW (ref 3.5–5.0)
BUN: 6 mg/dL — ABNORMAL LOW (ref 7–26)
CO2: 29 mmol/L (ref 22–29)
CREATININE: 0.74 mg/dL (ref 0.60–1.10)
Calcium: 9 mg/dL (ref 8.4–10.4)
Chloride: 99 mmol/L (ref 98–109)
Glucose, Bld: 120 mg/dL (ref 70–140)
Potassium: 5.7 mmol/L — ABNORMAL HIGH (ref 3.5–5.1)
SODIUM: 138 mmol/L (ref 136–145)
Total Protein: 6.8 g/dL (ref 6.4–8.3)

## 2017-08-27 LAB — CBC WITH DIFFERENTIAL (CANCER CENTER ONLY)
BASOS ABS: 0.1 10*3/uL (ref 0.0–0.1)
BASOS PCT: 1 %
EOS ABS: 0.5 10*3/uL (ref 0.0–0.5)
Eosinophils Relative: 6 %
HEMATOCRIT: 28.2 % — AB (ref 34.8–46.6)
HEMOGLOBIN: 8.6 g/dL — AB (ref 11.6–15.9)
Lymphocytes Relative: 28 %
Lymphs Abs: 2.1 10*3/uL (ref 0.9–3.3)
MCH: 26.4 pg (ref 26.0–34.0)
MCHC: 30.5 g/dL — AB (ref 32.0–36.0)
MCV: 86.5 fL (ref 81.0–101.0)
MONO ABS: 0.8 10*3/uL (ref 0.1–0.9)
MONOS PCT: 10 %
NEUTROS ABS: 4.1 10*3/uL (ref 1.5–6.5)
NEUTROS PCT: 55 %
Platelet Count: 546 10*3/uL — ABNORMAL HIGH (ref 145–400)
RBC: 3.26 MIL/uL — ABNORMAL LOW (ref 3.70–5.32)
RDW: 14.3 % (ref 11.1–15.7)
WBC Count: 7.5 10*3/uL (ref 3.9–10.0)

## 2017-08-27 LAB — RETICULOCYTES
RBC.: 3.27 MIL/uL — ABNORMAL LOW (ref 3.70–5.45)
Retic Count, Absolute: 39.2 10*3/uL (ref 33.7–90.7)
Retic Ct Pct: 1.2 % (ref 0.7–2.1)

## 2017-08-27 LAB — IRON AND TIBC
Iron: 28 ug/dL — ABNORMAL LOW (ref 41–142)
Saturation Ratios: 8 % — ABNORMAL LOW (ref 21–57)
TIBC: 350 ug/dL (ref 236–444)
UIBC: 322 ug/dL

## 2017-08-27 LAB — LACTATE DEHYDROGENASE: LDH: 173 U/L (ref 125–245)

## 2017-08-27 LAB — FERRITIN: FERRITIN: 24 ng/mL (ref 9–269)

## 2017-08-27 LAB — SAVE SMEAR

## 2017-08-27 MED ORDER — SODIUM CHLORIDE 0.9 % IV SOLN
510.0000 mg | Freq: Once | INTRAVENOUS | Status: AC
  Administered 2017-08-27: 510 mg via INTRAVENOUS
  Filled 2017-08-27: qty 17

## 2017-08-27 MED ORDER — SODIUM CHLORIDE 0.9 % IV SOLN
Freq: Once | INTRAVENOUS | Status: AC
  Administered 2017-08-27: 14:00:00 via INTRAVENOUS

## 2017-08-27 NOTE — Patient Instructions (Signed)

## 2017-08-27 NOTE — Progress Notes (Signed)
Hematology/Oncology Consultation   Name: Tamara Adams      MRN: [id number]    Location: Room/bed info not found  Date: 08/27/2017 Time:11:52 AM   REFERRING PHYSICIAN: Beatrice Lecher, MD  REASON FOR CONSULT: Anemia   DIAGNOSIS:  Iron deficeincy anemia  Working up for possible lung cancer History of DVT of the left lower extremity  HISTORY OF PRESENT ILLNESS: Tamara Adams is a very pleasant 76 yo caucasian female with history of iron deficiency anemia. She is symptomatic with fatigue, increased SOB, weakness, chills and dizziness. She is craving cold food and drinks.  In December she developed a small bowel obstruction and had a non-STEMI. The SBO has resolved and she is now on Plavix. She is seeing cardiology Dr. Acie Fredrickson.  She denies any episodes of bleeding. She bruises easily on the Plavix.  She has been on supplemental O2 since 2010. She is currently on 8 L [place] 24 hours a day. She was a former smoker and quit 4-5 years ago. She was smoking 1/2 ppd.  She had a PET scan last week which showed a 2.5 x 1.7 cm nodule in the left lung apex. This is suspicious for lung cancer.  No family history of anemia. Her mother had ovarian cancer.  She has had some nausea, no vomiting.  No fever, cough, rash, chest pain, palpitations or changes in bowel or bladder habits.  She has intermittent abdominal pain and constipation. She takes Mirilax which helps resolve the constipation.  No swelling or tenderness in her extremities. The numbness and tingling in her fingertips is unchanged. No c/o pain at this time.  She has history of a DVT "years ago" in the left leg after surgery.  Her appetite is decreased but she is trying to stay hydrated. Her weight is stable.  She travelled a lot with her husband who was in the [employer].   ROS: All other 10 point review of systems is negative.   PAST MEDICAL HISTORY:   Past Medical History:  Diagnosis Date  . Anemia    as a child  . Arthritis    "hands" (12/24/2014)  .  Complication of anesthesia    " My blood gas dropped during surgery so I was left on a ventilator and in ICU for 3 days."  . COPD (chronic obstructive pulmonary disease) (HCC)    FVC 72%, FEV1 29%, FEV1 ratio 32% (very severe (COPD)  . COPD (chronic obstructive pulmonary disease) ([place])   . Degenerative disc disease   . Depression with anxiety   . Diverticulosis   . DVT (deep venous thrombosis) ([place])    "LLE; years ago after a surgery"  . Essential hypertension   . Fluttering sensation of heart    pt put on metoprolol as a result  . GERD (gastroesophageal reflux disease)    PMH  . H/O hiatal hernia   . Headache    "maybe weekly" (12/24/2014)  . Hyperlipidemia   . Hypertension   . Kidney stone   . Liver lesion   . On home oxygen therapy    "3L; sleep w/it; use it when I rest" (12/24/2014)  . Peptic ulcer   . Pinched nerve    in back  . Pneumonia   . PONV (postoperative nausea and vomiting)   . Shortness of breath dyspnea    with exertion  . Skipped heart beats   . Small bowel obstruction ([place])    "several times; OR twice" (12/24/2014)  . Tubular adenoma of colon 09/2011  .  Wears glasses     ALLERGIES: Allergies  Allergen Reactions  . Maxidex [Dexamethasone] Other (See Comments)    DEHYDRATION AND HEART RACING  . Advair Diskus [Fluticasone-Salmeterol] Other (See Comments)    No benefit with lungs (INEFFECTIVE)  . Remeron [Mirtazapine] Other (See Comments)    CAUSED NIGHTMARES  . Trazodone And Nefazodone Other (See Comments)    Heart pounding  . Tylox [Oxycodone-Acetaminophen] Itching      MEDICATIONS:  Current Outpatient Medications on File Prior to Visit  Medication Sig Dispense Refill  . acetaminophen (TYLENOL) 325 MG tablet Take 325-650 mg by mouth every 6 (six) hours as needed for headache.    . albuterol (PROVENTIL) (2.5 MG/3ML) 0.083% nebulizer solution USE 1 VIAL (3 ML) VIA NEBULIZER EVERY 6 HOURS AS NEEDED FOR WHEEZING OR SHORTNESS OF BREATH 810 mL 12  .  AMBULATORY NON FORMULARY MEDICATION Medication Name: Needs oxygen concentrator set to 6 liter per minutes as she has a long tubing on her machine.  Fax to [employer] 1 vial 0  . AMBULATORY NON FORMULARY MEDICATION Medication Name: GI cocktail: 133mL Lidocaine2%, 188ml Maalox, 38ml Donnatol. Ok to take twice daily. 250 mL 3  . azithromycin (ZITHROMAX) 250 MG tablet 2 Ttabs PO on Day 1, then one a day x 4 days. 6 tablet 0  . clopidogrel (PLAVIX) 75 MG tablet Take 1 tablet by mouth daily 90 tablet 1  . dronabinol (MARINOL) 2.5 MG capsule Take 1 capsule (2.5 mg total) by mouth 2 (two) times daily. 60 capsule 1  . fluticasone furoate-vilanterol (BREO ELLIPTA) 100-25 MCG/INH AEPB Inhale 1 puff into the lungs daily. 2 each 0  . guaiFENesin (MUCINEX) 600 MG 12 hr tablet Take 1 tablet (600 mg total) by mouth 2 (two) times daily. 30 tablet 0  . HYDROcodone-acetaminophen (NORCO) 10-325 MG tablet Take 1 tablet by mouth every 6 (six) hours as needed. (Patient taking differently: Take 1 tablet by mouth every 6 (six) hours as needed for moderate pain. ) 60 tablet 0  . INCRUSE ELLIPTA 62.5 MCG/INH AEPB USE 1 INHALATION DAILY 90 each 3  . lidocaine (XYLOCAINE) 2 % solution Use as directed 20 mLs in the mouth or throat 2 (two) times daily as needed. (Patient taking differently: Use as directed 20 mLs in the mouth or throat 2 (two) times daily as needed for mouth pain. ) 900 mL 3  . losartan (COZAAR) 50 MG tablet Take 1 tablet (50 mg total) by mouth daily. 90 tablet 3  . metoprolol tartrate (LOPRESSOR) 50 MG tablet Take 1 tablet (50 mg total) by mouth 2 (two) times daily. 180 tablet 2  . Multiple Vitamin (MULTIVITAMIN WITH MINERALS) TABS tablet Take 1 tablet by mouth daily. Pt uses One-A-Day brand    . omeprazole (PRILOSEC) 40 MG capsule Take 1 capsule (40 mg total) by mouth 2 (two) times daily. 60 capsule 3  . ondansetron (ZOFRAN-ODT) 8 MG disintegrating tablet PLACE 1 TABLET ON THE TONGUE EVERY 8 HOURS AS NEEDED FOR NAUSEA  90 tablet 1  . polyethylene glycol (MIRALAX / GLYCOLAX) packet Take 17 g by mouth daily. 14 each 0  . promethazine (PHENERGAN) 25 MG tablet Take 1 tablet (25 mg total) by mouth every 8 (eight) hours as needed for nausea or vomiting. 90 tablet 0  . ranitidine (ZANTAC) 150 MG tablet Take 1 tablet (150 mg total) by mouth daily. 90 tablet 4  . traMADol (ULTRAM) 50 MG tablet Take 1 tablet (50 mg total) by mouth every 6 (six) hours  as needed (pain). 270 tablet 1   No current facility-administered medications on file prior to visit.      PAST SURGICAL HISTORY Past Surgical History:  Procedure Laterality Date  . ABDOMINAL ADHESION SURGERY  12/24/2014  . ABDOMINAL HYSTERECTOMY  1987   and one ovary  . CATARACT EXTRACTION W/ INTRAOCULAR LENS  IMPLANT, BILATERAL Right 06/22/2012   Dr. Lester Kinsman.   Marland Kitchen CATARACT EXTRACTION W/PHACO Left 06/12/2012   Dr. Boykin Reaper  . CHOLECYSTECTOMY N/A 08/18/2013   Procedure: LAPAROSCOPIC CHOLECYSTECTOMY;  Surgeon: Harl Bowie, MD;  Location: [place];  Service: General;  Laterality: N/A;  . DILATION AND CURETTAGE OF UTERUS    . EXPLORATORY LAPAROTOMY  12/24/2014  . HERNIA REPAIR    . INCISIONAL HERNIA REPAIR  12/24/2014  . INCISIONAL HERNIA REPAIR N/A 12/24/2014   Procedure: HERNIA REPAIR INCISIONAL;  Surgeon: Coralie Keens, MD;  Location: [place];  Service: General;  Laterality: N/A;  . LAPAROTOMY N/A 12/24/2014   Procedure: EXPLORATORY LAPAROTOMY;  Surgeon: Coralie Keens, MD;  Location: [place];  Service: General;  Laterality: N/A;  . LYSIS OF ADHESION N/A 12/24/2014   Procedure: LYSIS OF ADHESION;  Surgeon: Coralie Keens, MD;  Location: [place];  Service: General;  Laterality: N/A;  . MULTIPLE TOOTH EXTRACTIONS    . OOPHORECTOMY  1992  . SMALL INTESTINE SURGERY     for a blockage, no bowel was removed, just kinked from scar tissue  . TUBAL LIGATION    . URETHRAL DILATION      FAMILY HISTORY: Family History  Problem Relation Age of Onset  . Hypertension  Mother   . Ovarian cancer Mother   . Glaucoma Mother   . Glaucoma Sister   . Colon cancer Neg Hx   . Colon polyps Neg Hx   . Diabetes Neg Hx   . Kidney disease Neg Hx   . Gallbladder disease Neg Hx   . Esophageal cancer Neg Hx     SOCIAL HISTORY:  reports that she quit smoking about 2 years ago. Her smoking use included cigarettes. She has a 14.25 pack-year smoking history. she has never used smokeless tobacco. She reports that she does not drink alcohol or use drugs.  PERFORMANCE STATUS: The patient's performance status is 2 - Symptomatic, <50% confined to bed  PHYSICAL EXAM: Most Recent Vital Signs: Blood pressure (!) 170/54, pulse 84, temperature 97.6 F (36.4 C), temperature source Oral, resp. rate 18, weight 90 lb (40.8 kg), SpO2 100 %. BP (!) 170/54 (BP Location: Left Arm, Patient Position: Sitting)   Pulse 84   Temp 97.6 F (36.4 C) (Oral)   Resp 18   Wt 90 lb (40.8 kg)   SpO2 100%   BMI 15.45 kg/m   General Appearance:    Alert, cooperative, no distress, appears stated age  Head:    Normocephalic, without obvious abnormality, atraumatic  Eyes:    PERRL, conjunctiva/corneas clear, EOM's intact, fundi    benign, both eyes        Throat:   Lips, mucosa, and tongue normal; teeth and gums normal  Neck:   Supple, symmetrical, trachea midline, no adenopathy;    thyroid:  no enlargement/tenderness/nodules; no carotid   bruit or JVD  Back:     Symmetric, no curvature, ROM normal, no CVA tenderness  Lungs:     Decreased on the left with inspiratory and expiratory wheezes throughout bilaterally, on 8L supplemental O2 via [place]  Chest Wall:    No tenderness or deformity  Heart:    Regular rate and rhythm, S1 and S2 normal, no murmur, rub   or gallop     Abdomen:     Soft, non-tender, bowel sounds active all four quadrants,    no masses, no organomegaly        Extremities:   Extremities normal, atraumatic, no cyanosis or edema  Pulses:   2+ and symmetric all extremities   Skin:   Skin color, texture, turgor normal, no rashes or lesions  Lymph nodes:   Cervical, supraclavicular, and axillary nodes normal  Neurologic:   CNII-XII intact, normal strength, sensation and reflexes    throughout    LABORATORY DATA:  Results for orders placed or performed in visit on 08/27/17 (from the past 48 hour(s))  CBC with Differential (Cancer Center Only)     Status: Abnormal   Collection Time: 08/27/17 10:29 AM  Result Value Ref Range   WBC Count 7.5 3.9 - 10.0 K/uL   RBC 3.26 (L) 3.70 - 5.32 MIL/uL   Hemoglobin 8.6 (L) 11.6 - 15.9 g/dL   HCT 28.2 (L) 34.8 - 46.6 %   MCV 86.5 81.0 - 101.0 fL   MCH 26.4 26.0 - 34.0 pg   MCHC 30.5 (L) 32.0 - 36.0 g/dL   RDW 14.3 11.1 - 15.7 %   Platelet Count 546 (H) 145 - 400 K/uL   Neutrophils Relative % 55 %   Neutro Abs 4.1 1.5 - 6.5 K/uL   Lymphocytes Relative 28 %   Lymphs Abs 2.1 0.9 - 3.3 K/uL   Monocytes Relative 10 %   Monocytes Absolute 0.8 0.1 - 0.9 K/uL   Eosinophils Relative 6 %   Eosinophils Absolute 0.5 0.0 - 0.5 K/uL   Basophils Relative 1 %   Basophils Absolute 0.1 0.0 - 0.1 K/uL    Comment: Performed at [facility] at [facility], [address] [place], [place] [address]      RADIOGRAPHY: No results found.     PATHOLOGY: None   ASSESSMENT/PLAN: Tamara Adams is a very pleasant 77 yo caucasian female with history of iron deficiency anemia. She is symptomatic with fatigue, increased SOB, weakness, chills and dizziness. Hgb 8.6.  Iron saturation is 8% with ferritin 24. We will give her iron today and a second dose next week.  She was a smoker and has a 2.5 x 1.7 cm nodule in the left lung apex suspicious for lung cancer.  She has an MI in December and is on Plavix and is also on 8 L supplemental O2 24 hours a day due to COPD. We will contact Dr. Sondra Come and discuss treating her with radiosurgery and possibly avoiding biopsy.  We will go ahead and plan to see her back in 3 weeks for  follow-up.   All questions were answered. She will contact our office with any questions or concerns. We can certainly see her much sooner if necessary.  She was discussed with and also seen by Dr. Marin Olp and he is in agreement with the aforementioned.   Laverna Peace, FNP-BC     Addendum:   I saw and examined the patient with Judson Roch.  I agree with the above assessment.  She has a poor performance status.  Her performance status is ECOG 3.  The lesion in the left lung that is not that amenable to biopsy.  It is quite avid on the PET scan.  She is a smoker.  As such, I have to believe  that this has a very high likelihood of being malignant.  I think that she would be a very good candidate for stereotactic radiosurgery.  I would recommend this for her.  This should be effective.  I will have to speak with radiation oncology.  Hopefully, they can do the procedure without requiring a biopsy.  The other issue that we have with Tamara Adams is her iron deficiency.  Her iron studies show her ferritin to be 24 with an iron saturation of 8%.  She probably is not absorbing iron all that well.  I do not think she is having any problems with bleeding.  I think that she will respond very nicely to IV iron.  We will have to get her set up for this.  We spoke to Tamara Adams for about 45 minutes.  She is very nice.  She would be willing to try radiosurgery.  Again, she is not a candidate for surgery.  I think that even a biopsy would be incredibly difficult to accomplish given the location of the lesion in her overall performance status.  We spent over 50% of the time face-to-face.  We reviewed her scans.  We reviewed her labs.  We answered her questions.  Hopefully, radiation oncology will be able to treat her without a biopsy.  We will plan to get her back to see [place] in another month or so.  Lattie Haw, MD

## 2017-08-28 ENCOUNTER — Encounter: Payer: Self-pay | Admitting: Radiation Oncology

## 2017-08-28 DIAGNOSIS — J449 Chronic obstructive pulmonary disease, unspecified: Secondary | ICD-10-CM | POA: Diagnosis not present

## 2017-08-28 DIAGNOSIS — E43 Unspecified severe protein-calorie malnutrition: Secondary | ICD-10-CM | POA: Diagnosis not present

## 2017-08-28 DIAGNOSIS — I481 Persistent atrial fibrillation: Secondary | ICD-10-CM | POA: Diagnosis not present

## 2017-08-28 DIAGNOSIS — I214 Non-ST elevation (NSTEMI) myocardial infarction: Secondary | ICD-10-CM | POA: Diagnosis not present

## 2017-08-28 DIAGNOSIS — Z9981 Dependence on supplemental oxygen: Secondary | ICD-10-CM | POA: Diagnosis not present

## 2017-08-28 DIAGNOSIS — I10 Essential (primary) hypertension: Secondary | ICD-10-CM | POA: Diagnosis not present

## 2017-08-28 LAB — ERYTHROPOIETIN: Erythropoietin: 28.8 m[IU]/mL — ABNORMAL HIGH (ref 2.6–18.5)

## 2017-08-28 NOTE — Progress Notes (Signed)
Thoracic Location of Tumor / Histology: 2.5 x 1.7 cm nodule in the left lung apex that was seen on PET scan from 08/19/17.  Tobacco/Marijuana/Snuff/ETOH use: former smoker - quit in 2017 - smoked for 57 years, denies ETOH use, currently using e-cigarettes.  Past/Anticipated interventions by cardiothoracic surgery, if any: Patient is not a surgical candidate  Past/Anticipated interventions by medical oncology, if any: no  Signs/Symptoms  Weight changes, if any: has lost 10-15 lbs over the last 2 years.  She reports having nausea all the time.  She takes a GI cocktail, zofran and phenergan which help a little.  Respiratory complaints, if any: has used supplemental oxygen since 2010 - currently using 8 L. Reports having a cough with whitish/yellow sputum.  Hemoptysis, if any: no  Pain issues, if any:  Yes - in her abdomen 3/10 today.  SAFETY ISSUES:  Prior radiation? no  Pacemaker/ICD? no   Possible current pregnancy?no  Is the patient on methotrexate? no  Current Complaints / other details:  Patient is here with her sister.  BP 118/63 (BP Location: Left Arm, Patient Position: Sitting)   Pulse 74   Temp 97.9 F (36.6 C) (Oral)   Ht 5\' 4"  (1.626 m)   Wt 89 lb (40.4 kg)   SpO2 100% Comment: 8L  BMI 15.28 kg/m    Wt Readings from Last 3 Encounters:  08/29/17 89 lb (40.4 kg)  08/27/17 90 lb (40.8 kg)  08/07/17 83 lb (37.6 kg)

## 2017-08-29 ENCOUNTER — Ambulatory Visit
Admission: RE | Admit: 2017-08-29 | Discharge: 2017-08-29 | Disposition: A | Discharge status: Home / Self Care | Payer: Medicare Other | Source: Ambulatory Visit | Attending: Radiation Oncology | Admitting: Radiation Oncology

## 2017-08-29 ENCOUNTER — Encounter: Payer: Self-pay | Admitting: Radiation Oncology

## 2017-08-29 ENCOUNTER — Other Ambulatory Visit: Payer: Self-pay

## 2017-08-29 VITALS — BP 118/63 | HR 74 | Temp 97.9°F | Ht 64.0 in | Wt 89.0 lb

## 2017-08-29 DIAGNOSIS — Z8249 Family history of ischemic heart disease and other diseases of the circulatory system: Secondary | ICD-10-CM | POA: Diagnosis not present

## 2017-08-29 DIAGNOSIS — R918 Other nonspecific abnormal finding of lung field: Secondary | ICD-10-CM | POA: Insufficient documentation

## 2017-08-29 DIAGNOSIS — Z87891 Personal history of nicotine dependence: Secondary | ICD-10-CM | POA: Insufficient documentation

## 2017-08-29 DIAGNOSIS — F418 Other specified anxiety disorders: Secondary | ICD-10-CM | POA: Diagnosis not present

## 2017-08-29 DIAGNOSIS — E785 Hyperlipidemia, unspecified: Secondary | ICD-10-CM | POA: Diagnosis not present

## 2017-08-29 DIAGNOSIS — R911 Solitary pulmonary nodule: Secondary | ICD-10-CM | POA: Insufficient documentation

## 2017-08-29 DIAGNOSIS — Z8041 Family history of malignant neoplasm of ovary: Secondary | ICD-10-CM | POA: Insufficient documentation

## 2017-08-29 DIAGNOSIS — Z9049 Acquired absence of other specified parts of digestive tract: Secondary | ICD-10-CM | POA: Diagnosis not present

## 2017-08-29 DIAGNOSIS — I10 Essential (primary) hypertension: Secondary | ICD-10-CM | POA: Diagnosis not present

## 2017-08-29 DIAGNOSIS — Z90721 Acquired absence of ovaries, unilateral: Secondary | ICD-10-CM | POA: Insufficient documentation

## 2017-08-29 DIAGNOSIS — Z90710 Acquired absence of both cervix and uterus: Secondary | ICD-10-CM | POA: Insufficient documentation

## 2017-08-29 DIAGNOSIS — Z9841 Cataract extraction status, right eye: Secondary | ICD-10-CM | POA: Diagnosis not present

## 2017-08-29 DIAGNOSIS — Z888 Allergy status to other drugs, medicaments and biological substances status: Secondary | ICD-10-CM | POA: Diagnosis not present

## 2017-08-29 DIAGNOSIS — G8929 Other chronic pain: Secondary | ICD-10-CM | POA: Insufficient documentation

## 2017-08-29 DIAGNOSIS — Z7951 Long term (current) use of inhaled steroids: Secondary | ICD-10-CM | POA: Insufficient documentation

## 2017-08-29 DIAGNOSIS — J449 Chronic obstructive pulmonary disease, unspecified: Secondary | ICD-10-CM | POA: Insufficient documentation

## 2017-08-29 DIAGNOSIS — Z9842 Cataract extraction status, left eye: Secondary | ICD-10-CM | POA: Diagnosis not present

## 2017-08-29 DIAGNOSIS — Z87442 Personal history of urinary calculi: Secondary | ICD-10-CM | POA: Insufficient documentation

## 2017-08-29 DIAGNOSIS — Z8711 Personal history of peptic ulcer disease: Secondary | ICD-10-CM | POA: Insufficient documentation

## 2017-08-29 DIAGNOSIS — Z86718 Personal history of other venous thrombosis and embolism: Secondary | ICD-10-CM | POA: Insufficient documentation

## 2017-08-29 DIAGNOSIS — Z9981 Dependence on supplemental oxygen: Secondary | ICD-10-CM | POA: Insufficient documentation

## 2017-08-29 DIAGNOSIS — Z79899 Other long term (current) drug therapy: Secondary | ICD-10-CM | POA: Insufficient documentation

## 2017-08-29 DIAGNOSIS — M25511 Pain in right shoulder: Secondary | ICD-10-CM | POA: Insufficient documentation

## 2017-08-29 DIAGNOSIS — K219 Gastro-esophageal reflux disease without esophagitis: Secondary | ICD-10-CM | POA: Diagnosis not present

## 2017-08-29 DIAGNOSIS — Z961 Presence of intraocular lens: Secondary | ICD-10-CM | POA: Insufficient documentation

## 2017-08-29 DIAGNOSIS — I252 Old myocardial infarction: Secondary | ICD-10-CM | POA: Insufficient documentation

## 2017-08-29 DIAGNOSIS — R5383 Other fatigue: Secondary | ICD-10-CM | POA: Diagnosis not present

## 2017-08-29 DIAGNOSIS — Z7902 Long term (current) use of antithrombotics/antiplatelets: Secondary | ICD-10-CM | POA: Insufficient documentation

## 2017-08-29 HISTORY — DX: Acute myocardial infarction, unspecified: I21.9

## 2017-08-29 NOTE — Progress Notes (Signed)
Radiation Oncology         [phone] ________________________________  Initial Outpatient Consultation  Name: Tamara Adams MRN: [id number]  Date: 08/29/2017  DOB: Aug 26, 1940  OZ:[id number], Rene Kocher, MD  Volanda Napoleon, MD   REFERRING PHYSICIAN: Volanda Napoleon, MD  DIAGNOSIS: PET positive pulmonary nodule presenting in the apex of the left upper lung  HISTORY OF PRESENT ILLNESS::Tamara Adams is a 77 y.o. female who presents today for evaluation and the management of her PET positive pulmonary nodule presenting in the apex of the left upper lung. She smoked for 57 years and quit in 2017. She is currently using e-cigarettes.The pt has a hx of chronic abdominal pain, intermittent constipation,COPD and is on oxygen. She has had multiple surgeries for bowel obstructions.The patient is not a good candidate for surgery pertaining to her new diagnoses.   She underwent a CT scan on 07/04/2017 that revealed, IMPRESSION: Apical left upper lobe 2.5 cm solid pulmonary nodule, not appreciably changed since 05/31/2017 outside chest CT, new since 03/25/2016 chest CT, cannot exclude primary bronchogenic carcinoma.PET-CT is indicated for further characterization. She then underwent a PET scan on 08/19/2017 that revealed, IMPRESSION:  2.5 x 1.7 cm mildly FDG avid nodule in the left lung apex, worrisome for primary bronchogenic neoplasm. nodule in the left lung.  Pt presents today with an oxygen tank and a nasal cannula. She is accompanyed by her sister. Pt reports that she recently had a heart attack. Pt states that she was in the hospital for bowel blockage that did not need to be operated on, when the heart attack occurred. Pt notes that she always has shortness of breath. Pt states that her oxygen was increased from 6L to 8L after having surgery on her bowels. Pt states that she takes pain medication for her bowels when she has too. Pt is scheduled to have a blood transfusion in the upcoming weeks. Pt  states that she has an inhaler and uses it as needed. Pt states that she does have dentures. Pt reports having some pain in her right shoulder. Pt denies having pain in her chest, problems with swallowing, double vision, headaches and hemoptysis.   PREVIOUS RADIATION THERAPY: No  PAST MEDICAL HISTORY:  has a past medical history of Anemia, Arthritis, Complication of anesthesia, COPD (chronic obstructive pulmonary disease) ([place]), COPD (chronic obstructive pulmonary disease) ([place]), Degenerative disc disease, Depression with anxiety, Diverticulosis, DVT (deep venous thrombosis) ([place]), Essential hypertension, Fluttering sensation of heart, GERD (gastroesophageal reflux disease), H/O hiatal hernia, Headache, Hyperlipidemia, Hypertension, Kidney stone, Liver lesion, MI (myocardial infarction) ([place]) (07/19/2017), On home oxygen therapy, Peptic ulcer, Pinched nerve, Pneumonia, PONV (postoperative nausea and vomiting), Shortness of breath dyspnea, Skipped heart beats, Small bowel obstruction ([place]), Tubular adenoma of colon (09/2011), and Wears glasses.    PAST SURGICAL HISTORY: Past Surgical History:  Procedure Laterality Date  . ABDOMINAL ADHESION SURGERY  12/24/2014  . ABDOMINAL HYSTERECTOMY  1987   and one ovary  . CATARACT EXTRACTION W/ INTRAOCULAR LENS  IMPLANT, BILATERAL Right 06/22/2012   Dr. Lester Kinsman.   Marland Kitchen CATARACT EXTRACTION W/PHACO Left 06/12/2012   Dr. Boykin Reaper  . CHOLECYSTECTOMY N/A 08/18/2013   Procedure: LAPAROSCOPIC CHOLECYSTECTOMY;  Surgeon: Harl Bowie, MD;  Location: [facility];  Service: General;  Laterality: N/A;  . DILATION AND CURETTAGE OF UTERUS    . EXPLORATORY LAPAROTOMY  12/24/2014  . HERNIA REPAIR    . INCISIONAL HERNIA REPAIR  12/24/2014  . INCISIONAL HERNIA REPAIR N/A 12/24/2014   Procedure: HERNIA  REPAIR INCISIONAL;  Surgeon: Coralie Keens, MD;  Location: [facility];  Service: General;  Laterality: N/A;  . LAPAROTOMY N/A 12/24/2014   Procedure: EXPLORATORY LAPAROTOMY;  Surgeon:  Coralie Keens, MD;  Location: [facility];  Service: General;  Laterality: N/A;  . LYSIS OF ADHESION N/A 12/24/2014   Procedure: LYSIS OF ADHESION;  Surgeon: Coralie Keens, MD;  Location: [facility];  Service: General;  Laterality: N/A;  . MULTIPLE TOOTH EXTRACTIONS    . OOPHORECTOMY  1992  . SMALL INTESTINE SURGERY     for a blockage, no bowel was removed, just kinked from scar tissue  . TUBAL LIGATION    . URETHRAL DILATION      FAMILY HISTORY: family history includes Glaucoma in her mother and sister; Hypertension in her mother; Ovarian cancer in her mother; Pancreatic cancer in her maternal uncle.  SOCIAL HISTORY:  reports that she quit smoking about 2 years ago. Her smoking use included cigarettes. She has a 14.25 pack-year smoking history. she has never used smokeless tobacco. She reports that she does not drink alcohol or use drugs.  ALLERGIES: Maxidex [dexamethasone]; Advair diskus [fluticasone-salmeterol]; Remeron [mirtazapine]; Trazodone and nefazodone; and Tylox [oxycodone-acetaminophen]  MEDICATIONS:  Current Outpatient Medications  Medication Sig Dispense Refill  . acetaminophen (TYLENOL) 325 MG tablet Take 325-650 mg by mouth every 6 (six) hours as needed for headache.    . albuterol (PROVENTIL) (2.5 MG/3ML) 0.083% nebulizer solution USE 1 VIAL (3 ML) VIA NEBULIZER EVERY 6 HOURS AS NEEDED FOR WHEEZING OR SHORTNESS OF BREATH 810 mL 12  . AMBULATORY NON FORMULARY MEDICATION Medication Name: Needs oxygen concentrator set to 6 liter per minutes as she has a long tubing on her machine.  Fax to [facility] 1 vial 0  . AMBULATORY NON FORMULARY MEDICATION Medication Name: GI cocktail: 11mL Lidocaine2%, 146ml Maalox, 67ml Donnatol. Ok to take twice daily. 250 mL 3  . clopidogrel (PLAVIX) 75 MG tablet Take 1 tablet by mouth daily 90 tablet 1  . dronabinol (MARINOL) 2.5 MG capsule Take 1 capsule (2.5 mg total) by mouth 2 (two) times daily. 60 capsule 1  . fluticasone furoate-vilanterol (BREO  ELLIPTA) 100-25 MCG/INH AEPB Inhale 1 puff into the lungs daily. 2 each 0  . guaiFENesin (MUCINEX) 600 MG 12 hr tablet Take 1 tablet (600 mg total) by mouth 2 (two) times daily. 30 tablet 0  . HYDROcodone-acetaminophen (NORCO) 10-325 MG tablet Take 1 tablet by mouth every 6 (six) hours as needed. (Patient taking differently: Take 1 tablet by mouth every 6 (six) hours as needed for moderate pain. ) 60 tablet 0  . INCRUSE ELLIPTA 62.5 MCG/INH AEPB USE 1 INHALATION DAILY 90 each 3  . lidocaine (XYLOCAINE) 2 % solution Use as directed 20 mLs in the mouth or throat 2 (two) times daily as needed. (Patient taking differently: Use as directed 20 mLs in the mouth or throat 2 (two) times daily as needed for mouth pain. ) 900 mL 3  . losartan (COZAAR) 50 MG tablet Take 1 tablet (50 mg total) by mouth daily. 90 tablet 3  . metoprolol tartrate (LOPRESSOR) 50 MG tablet Take 1 tablet (50 mg total) by mouth 2 (two) times daily. 180 tablet 2  . Multiple Vitamin (MULTIVITAMIN WITH MINERALS) TABS tablet Take 1 tablet by mouth daily. Pt uses One-A-Day brand    . omeprazole (PRILOSEC) 40 MG capsule Take 1 capsule (40 mg total) by mouth 2 (two) times daily. 60 capsule 3  . ondansetron (ZOFRAN-ODT) 8 MG disintegrating tablet PLACE  1 TABLET ON THE TONGUE EVERY 8 HOURS AS NEEDED FOR NAUSEA 90 tablet 1  . polyethylene glycol (MIRALAX / GLYCOLAX) packet Take 17 g by mouth daily. 14 each 0  . promethazine (PHENERGAN) 25 MG tablet Take 1 tablet (25 mg total) by mouth every 8 (eight) hours as needed for nausea or vomiting. 90 tablet 0  . ranitidine (ZANTAC) 150 MG tablet Take 1 tablet (150 mg total) by mouth daily. 90 tablet 4  . traMADol (ULTRAM) 50 MG tablet Take 1 tablet (50 mg total) by mouth every 6 (six) hours as needed (pain). 270 tablet 1   No current facility-administered medications for this encounter.     REVIEW OF SYSTEMS:  REVIEW OF SYSTEMS: A 10+ POINT REVIEW OF SYSTEMS WAS OBTAINED including neurology,  dermatology, psychiatry, cardiac, respiratory, lymph, extremities, GI, GU, musculoskeletal, constitutional, reproductive, HEENT. All pertinent positives are noted in the HPI. All others are negative.   PHYSICAL EXAM:  height is 5\' 4"  (1.626 m) and weight is 89 lb (40.4 kg). Her oral temperature is 97.9 F (36.6 C). Her blood pressure is 118/63 and her pulse is 74. Her oxygen saturation is 100%.   General: Alert and oriented, in no acute distress. Pt is on 8L of oxygen by nasal cannula. HEENT: Head is normocephalic. Extraocular movements are intact. Oropharynx is clear. Pt has denutures along the maxillary region. Neck: Neck is supple, no palpable cervical or supraclavicular lymphadenopathy. Heart: Regular in rate and rhythm with no murmurs, rubs, or gallops. Chest: The lung exam so some mild scar to a wheezing bilaterally Abdomen: Soft, nontender, nondistended, with no rigidity or guarding. Extremities: No cyanosis or edema. Lymphatics: see Neck Exam Skin: No concerning lesions. Musculoskeletal: symmetric strength and muscle tone throughout. Neurologic: Cranial nerves II through XII are grossly intact. No obvious focalities. Speech is fluent. Coordination is intact. Psychiatric: Judgment and insight are intact. Affect is appropriate.  ECOG = 2  0 - Asymptomatic (Fully active, able to carry on all predisease activities without restriction)  1 - Symptomatic but completely ambulatory (Restricted in physically strenuous activity but ambulatory and able to carry out work of a light or sedentary nature. For example, light housework, office work)  2 - Symptomatic, <50% in bed during the day (Ambulatory and capable of all self care but unable to carry out any work activities. Up and about more than 50% of waking hours)  3 - Symptomatic, >50% in bed, but not bedbound (Capable of only limited self-care, confined to bed or chair 50% or more of waking hours)  4 - Bedbound (Completely disabled. Cannot  carry on any self-care. Totally confined to bed or chair)  5 - Death   Eustace Pen MM, Creech RH, Tormey DC, et al. [phone]). "Toxicity and response criteria of the [facility]". [place] Oncol. 5 (6): 649-55  LABORATORY DATA:  Lab Results  Component Value Date   WBC 7.5 08/27/2017   HGB 8.1 (L) 08/05/2017   HCT 28.2 (L) 08/27/2017   MCV 86.5 08/27/2017   PLT 546 (H) 08/27/2017   NEUTROABS 4.1 08/27/2017   Lab Results  Component Value Date   NA 138 08/27/2017   K 5.7 (H) 08/27/2017   CL 99 08/27/2017   CO2 29 08/27/2017   GLUCOSE 120 08/27/2017   CREATININE 0.74 08/27/2017   CALCIUM 9.0 08/27/2017      RADIOGRAPHY: Nm Pet Image Initial (pi) Skull Base To Thigh  Result Date: 08/19/2017 CLINICAL DATA:  Initial treatment strategy  for solitary pulmonary nodule. EXAM: NUCLEAR MEDICINE PET SKULL BASE TO THIGH TECHNIQUE: 5.0 mCi F-18 FDG was injected intravenously. Full-ring PET imaging was performed from the skull base to thigh after the radiotracer. CT data was obtained and used for attenuation correction and anatomic localization. FASTING BLOOD GLUCOSE:  Value: 93 mg/dl COMPARISON:  CT abdomen/pelvis dated 07/18/2017. CT chest dated 07/04/2017. Outside hospital CTA chest dated 05/31/2017. CTA chest dated 03/25/2016. FINDINGS: NECK: No hypermetabolic cervical lymphadenopathy. CHEST: 2.5 x 1.7 cm nodule in the left lung apex (series 8/image 12), unchanged from recent prior studies although new from 10-17-2015, max SUV 1.8. Despite relative low-grade hypermetabolism for size, this remains concerning for primary bronchogenic neoplasm given persistence on recent studies and interval change from 2015/10/17. Patchy opacities in the right lower (series 8/image 49) and left lower lobe (series 8/images 58 and 64), new, max SUV 2.4 on the right and 3.2 on the left. Given progression from the prior, as well as the CT appearance, this favors multifocal infection/pneumonia. Associated trace left  pleural effusion. No hypermetabolic thoracic lymphadenopathy. The heart is normal in size. No pericardial effusion. No evidence of thoracic aortic aneurysm. Atherosclerotic calcifications of the aortic arch. ABDOMEN/PELVIS: No abnormal hypermetabolism in the liver, spleen, pancreas, or bilateral adrenal glands. No hypermetabolic lymphadenopathy in the abdomen/pelvis. Status post cholecystectomy. Atherosclerotic calcifications the abdominal aorta and branch vessels. Sigmoid diverticulosis, without evidence of diverticulitis. Status post hysterectomy. SKELETON: No focal hypermetabolic activity to suggest skeletal metastasis. IMPRESSION: 2.5 x 1.7 cm mildly FDG avid nodule in the left lung apex, worrisome for primary bronchogenic neoplasm, as described above. Multifocal patchy opacities in the bilateral lower lobes, left greater than right, suspicious for multifocal infection/pneumonia. Associated trace left pleural effusion. No findings specific for metastatic disease. Electronically Signed   By: Julian Hy M.D.   On: 08/19/2017 15:03      IMPRESSION:  PET positive pulmonary nodule presenting in the apex of the left upper lung. Due to the patient's severe respiratory reserves she is not a candidate for surgery or biopsy for fear of pneumothorax which would likely take her life. 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 I discussed course of treatment side effects and potential toxicities of radiation therapy in this situation. I did discuss with the patient that if she develops significant radiation pneumonitis this complication could take her life. Given the fact that she likely has a lung cancer that will continue to grow and cause problems in the future she is willing to accept this risk.  PLAN: The pt is scheduled for SBRT simulation on 09/09/2017 at 2:00 pm.  I anticipate between 3 and 5 SBRT treatments. She will undergo blood transfusion next week which may help some with her fatigue issues.     ------------------------------------------------  Blair Promise, PhD, MD  This document serves as a record of services personally performed by Gery Pray, MD. It was created on his behalf by Valeta Harms, a trained medical scribe. The creation of this record is based on the scribe's personal observations and the provider's statements to them. This document has been checked and approved by the attending provider.

## 2017-09-02 ENCOUNTER — Other Ambulatory Visit: Payer: Self-pay

## 2017-09-02 DIAGNOSIS — I214 Non-ST elevation (NSTEMI) myocardial infarction: Secondary | ICD-10-CM | POA: Diagnosis not present

## 2017-09-02 DIAGNOSIS — I481 Persistent atrial fibrillation: Secondary | ICD-10-CM | POA: Diagnosis not present

## 2017-09-02 DIAGNOSIS — I10 Essential (primary) hypertension: Secondary | ICD-10-CM | POA: Diagnosis not present

## 2017-09-02 DIAGNOSIS — J449 Chronic obstructive pulmonary disease, unspecified: Secondary | ICD-10-CM | POA: Diagnosis not present

## 2017-09-02 DIAGNOSIS — E43 Unspecified severe protein-calorie malnutrition: Secondary | ICD-10-CM | POA: Diagnosis not present

## 2017-09-02 DIAGNOSIS — Z9981 Dependence on supplemental oxygen: Secondary | ICD-10-CM | POA: Diagnosis not present

## 2017-09-02 MED ORDER — PROMETHAZINE HCL 25 MG PO TABS: 25.0000 mg | ORAL_TABLET | Freq: Three times a day (TID) | ORAL | 2 refills | Status: DC | PRN

## 2017-09-03 ENCOUNTER — Inpatient Hospital Stay: Payer: Medicare Other

## 2017-09-03 VITALS — BP 140/47 | HR 64 | Temp 97.6°F | Resp 22

## 2017-09-03 DIAGNOSIS — Z79899 Other long term (current) drug therapy: Secondary | ICD-10-CM | POA: Diagnosis not present

## 2017-09-03 DIAGNOSIS — D508 Other iron deficiency anemias: Secondary | ICD-10-CM

## 2017-09-03 DIAGNOSIS — R531 Weakness: Secondary | ICD-10-CM | POA: Diagnosis not present

## 2017-09-03 DIAGNOSIS — R911 Solitary pulmonary nodule: Secondary | ICD-10-CM | POA: Diagnosis not present

## 2017-09-03 DIAGNOSIS — I252 Old myocardial infarction: Secondary | ICD-10-CM | POA: Diagnosis not present

## 2017-09-03 DIAGNOSIS — D509 Iron deficiency anemia, unspecified: Secondary | ICD-10-CM | POA: Diagnosis not present

## 2017-09-03 DIAGNOSIS — R5383 Other fatigue: Secondary | ICD-10-CM | POA: Diagnosis not present

## 2017-09-03 MED ORDER — SODIUM CHLORIDE 0.9 % IV SOLN
510.0000 mg | Freq: Once | INTRAVENOUS | Status: AC
  Administered 2017-09-03: 510 mg via INTRAVENOUS
  Filled 2017-09-03: qty 17

## 2017-09-03 MED ORDER — SODIUM CHLORIDE 0.9 % IV SOLN
Freq: Once | INTRAVENOUS | Status: AC
  Administered 2017-09-03: 13:00:00 via INTRAVENOUS

## 2017-09-03 NOTE — Patient Instructions (Signed)

## 2017-09-04 ENCOUNTER — Telehealth: Payer: Self-pay

## 2017-09-04 DIAGNOSIS — I481 Persistent atrial fibrillation: Secondary | ICD-10-CM | POA: Diagnosis not present

## 2017-09-04 DIAGNOSIS — Z9981 Dependence on supplemental oxygen: Secondary | ICD-10-CM | POA: Diagnosis not present

## 2017-09-04 DIAGNOSIS — J449 Chronic obstructive pulmonary disease, unspecified: Secondary | ICD-10-CM | POA: Diagnosis not present

## 2017-09-04 DIAGNOSIS — I214 Non-ST elevation (NSTEMI) myocardial infarction: Secondary | ICD-10-CM | POA: Diagnosis not present

## 2017-09-04 DIAGNOSIS — I10 Essential (primary) hypertension: Secondary | ICD-10-CM | POA: Diagnosis not present

## 2017-09-04 DIAGNOSIS — E43 Unspecified severe protein-calorie malnutrition: Secondary | ICD-10-CM | POA: Diagnosis not present

## 2017-09-04 NOTE — Telephone Encounter (Signed)
Yes, still needs B12 but can do at her convenience.

## 2017-09-04 NOTE — Telephone Encounter (Signed)
Tamara Adams called and asked if she still needs to get B12 injection. She is has had iron infusion once a week for the last 2 weeks. Please advise.

## 2017-09-05 NOTE — Telephone Encounter (Signed)
Patient advised.

## 2017-09-09 ENCOUNTER — Ambulatory Visit
Admission: RE | Admit: 2017-09-09 | Discharge: 2017-09-09 | Disposition: A | Discharge status: Home / Self Care | Payer: Medicare Other | Source: Ambulatory Visit | Attending: Radiation Oncology | Admitting: Radiation Oncology

## 2017-09-09 DIAGNOSIS — Z510 Encounter for antineoplastic radiation therapy: Secondary | ICD-10-CM | POA: Diagnosis not present

## 2017-09-09 DIAGNOSIS — R911 Solitary pulmonary nodule: Secondary | ICD-10-CM

## 2017-09-09 DIAGNOSIS — C3402 Malignant neoplasm of left main bronchus: Secondary | ICD-10-CM | POA: Insufficient documentation

## 2017-09-09 NOTE — Progress Notes (Signed)
  Radiation Oncology         [phone] ________________________________  Name: Tamara Adams MRN: [id number]  Date: 09/09/2017  DOB: May 25, 1941   STEREOTACTIC BODY RADIOTHERAPY SIMULATION AND TREATMENT PLANNING NOTE    DIAGNOSIS:   PET positive pulmonary nodule presenting in the apex of the left upper lung    NARRATIVE:  The patient was brought to the [facility].  Identity was confirmed.  All relevant records and images related to the planned course of therapy were reviewed.  The patient freely provided informed written consent to proceed with treatment after reviewing the details related to the planned course of therapy. The consent form was witnessed and verified by the simulation staff.  Then, the patient was set-up in a stable reproducible  supine position for radiation therapy.  A BodyFix immobilization pillow was fabricated for reproducible positioning.  Then I personally applied the abdominal compression paddle to limit respiratory excursion.  4D respiratoy motion management CT images were obtained.  Surface markings were placed.  The CT images were loaded into the planning software.  Then, using Cine, MIP, and standard views, the internal target volume (ITV) and planning target volumes (PTV) were delinieated, and avoidance structures were contoured.  Treatment planning then occurred.  The radiation prescription was entered and confirmed.  A total of two complex treatment devices were fabricated in the form of the BodyFix immobilization pillow and a neck accuform cushion.  I have requested : 3D Simulation  I have requested a DVH of the following structures: Heart, Lungs, Esophagus, Chest Wall, Brachial Plexus, Major Blood Vessels, and targets.  PLAN:  The patient will receive 54 Gy in 3 fractions.  -----------------------------------  Blair Promise, PhD, MD

## 2017-09-10 ENCOUNTER — Telehealth: Payer: Self-pay

## 2017-09-10 MED ORDER — OSELTAMIVIR PHOSPHATE 75 MG PO CAPS: 75.0000 mg | ORAL_CAPSULE | Freq: Every day | ORAL | 0 refills | Status: DC

## 2017-09-10 NOTE — Telephone Encounter (Signed)
Patient advised.

## 2017-09-10 NOTE — Telephone Encounter (Signed)
OK, Rx sent to pharmacy.

## 2017-09-10 NOTE — Telephone Encounter (Signed)
Tamara Adams called and states her granddaughter that lives with her has the flu. She does not have any flu symptoms. She was wanting Tamiflu sent to [facility].   CMP Latest Ref Rng & Units 08/27/2017 08/05/2017 07/27/2017  Glucose 70 - 140 mg/dL 120 99 98  BUN 7 - 26 mg/dL 6(L) 10 12  Creatinine 0.60 - 1.10 mg/dL 0.74 0.72 0.43(L)  Sodium 136 - 145 mmol/L 138 133(L) 137  Potassium 3.5 - 5.1 mmol/L 5.7(H) 5.0 3.7  Chloride 98 - 109 mmol/L 99 95(L) 103  CO2 22 - 29 mmol/L 29 33(H) 28  Calcium 8.4 - 10.4 mg/dL 9.0 8.1(L) 7.6(L)  Total Protein 6.4 - 8.3 g/dL 6.8 - -  Total Bilirubin 0.2 - 1.2 mg/dL <0.2(L) - -  Alkaline Phos 40 - 150 U/L 87 - -  AST 5 - 34 U/L 23 - -  ALT 0 - 55 U/L 9 - -

## 2017-09-12 DIAGNOSIS — R911 Solitary pulmonary nodule: Secondary | ICD-10-CM | POA: Diagnosis not present

## 2017-09-12 DIAGNOSIS — Z510 Encounter for antineoplastic radiation therapy: Secondary | ICD-10-CM | POA: Diagnosis not present

## 2017-09-12 DIAGNOSIS — C3402 Malignant neoplasm of left main bronchus: Secondary | ICD-10-CM | POA: Diagnosis not present

## 2017-09-16 ENCOUNTER — Encounter: Payer: Self-pay | Admitting: Family Medicine

## 2017-09-16 ENCOUNTER — Ambulatory Visit (INDEPENDENT_AMBULATORY_CARE_PROVIDER_SITE_OTHER): Payer: Medicare Other | Admitting: Family Medicine

## 2017-09-16 VITALS — BP 159/66 | HR 40 | Temp 97.5°F | Ht 64.0 in | Wt 92.5 lb

## 2017-09-16 DIAGNOSIS — L98421 Non-pressure chronic ulcer of back limited to breakdown of skin: Secondary | ICD-10-CM

## 2017-09-16 DIAGNOSIS — G894 Chronic pain syndrome: Secondary | ICD-10-CM

## 2017-09-16 DIAGNOSIS — M5136 Other intervertebral disc degeneration, lumbar region: Secondary | ICD-10-CM | POA: Diagnosis not present

## 2017-09-16 DIAGNOSIS — R109 Unspecified abdominal pain: Secondary | ICD-10-CM

## 2017-09-16 DIAGNOSIS — J9611 Chronic respiratory failure with hypoxia: Secondary | ICD-10-CM | POA: Diagnosis not present

## 2017-09-16 DIAGNOSIS — G8929 Other chronic pain: Secondary | ICD-10-CM | POA: Diagnosis not present

## 2017-09-16 MED ORDER — HYDROCODONE-ACETAMINOPHEN 10-325 MG PO TABS: 1.0000 | ORAL_TABLET | Freq: Four times a day (QID) | ORAL | 0 refills | Status: DC | PRN

## 2017-09-16 NOTE — Progress Notes (Signed)
Subjective:    CC: F/U COPD  HPI:  77 year old female is here today to follow-up for recent lung nodule.  She was found to have a solitary lung nodule and had a follow-up PET scan on January 28 PET scan revealed a 2.5 x 1.7 cm nodule in the left lung apex concerning for primary bronchogenic neoplasm.  He was also noted to have some multifocal patchy opacities in bilateral lobes.  Fortunately there was no sign of any metastatic disease.  She will actually start radiation therapy this week and will have 2 sessions this week and 3 sessions next week.  She is hopeful that she will do well with these treatments.    She also was recently treated for her iron deficiency and has received 2 iron infusions.  She follows up for repeat blood work believe later this week.  If her hemoglobin has not successfully elevated and then will consider transfusion.  COPD-she is overall stable.  She has not had any exacerbations since I last saw her.  She still has some good days and some bad days.  She has been having a lot of nausea with her chronic abdominal pain.  She also has a skin lesion  On her buttocks near the sacrum that she would like me to look at today.  She noticed it while taking a shower and felt a dry crusty lesion there.  She really tries to not sit for more than 10 or 15 minutes at a time to get pressure off her buttock area.  She mostly sits in a reclining chair during the day and actually sleeps in a chair as well because of shortness of breath.  She has not had any drainage from the wound he does not remove any injury.  She then tried sitting using a special cushion that has a special cut out for the sacral area but says it really is not comfortable after just been on it for a few minutes.  Chronic abdominal pain-she was able to get her GI cocktail we did run into a few problems feeling it last time.  Requesting refill on hydrocodone.  Last prescription was filled about 7 weeks ago.  She takes 1 tab  daily on average.  She caught a little over week ago because of positive exposure to influenza.  She did end up taking the Tamiflu daily and did well with that.  She has not gotten sick with influenza.  Past medical history, Surgical history, Family history not pertinant except as noted below, Social history, Allergies, and medications have been entered into the medical record, reviewed, and corrections made.   Review of Systems: No fevers, chills, night sweats, weight loss, chest pain, or shortness of breath.   Objective:    General: Well Developed, well nourished, and in no acute distress.  Neuro: Alert and oriented x3, extra-ocular muscles intact, sensation grossly intact.  HEENT: Normocephalic, atraumatic  Skin: Warm and dry, no rashes.  Sacral lesion measuring 1 x 0.5 cm in size.  The white colored tissue in the photograph is very dry and scaling.  No active drainage. Cardiac: Regular rate and rhythm, no murmurs rubs or gallops, no lower extremity edema.  Respiratory: Clear to auscultation bilaterally. Not using accessory muscles, speaking in full sentences.       Impression and Recommendations:   Skin ulceration on the sacrum-most of the skin and photograph above is actually just dry crusty skin.  Just encouraged her to moisturize it well with Vaseline.  She does not need to apply any special bandages at this point in time.  Will try to get as much pressure off the area as possible.  She is extremely thin and has almost no fatty tissue which can very easily put some chronic pressure on this area which is also caused of discoloration as above.  Iron deficiency anemia-status post 2 IV iron infusions.  Hopefully repeat labs will look good.  COPD, severe-no recent flares or exacerbations stable currently.  Continue current regimen.  Chronic abdominal pain with nausea-I did refill her hydrocodone today.  She usually uses it sparingly and 60 tabs usually last her almost 2 months.  Refill  to pharmacy today and I will see her back in 2 months.  Will check UDS at next OV. Pain contract will need to be updated at next OV.   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  We discussed the pros and cons of treatment as she is quite frail and decongestions.  She really wants to try treatment but if it becomes overtaxing that she would want comfort care prior discussion today.

## 2017-09-17 ENCOUNTER — Ambulatory Visit: Payer: Medicare Other | Admitting: Family

## 2017-09-17 ENCOUNTER — Other Ambulatory Visit: Payer: Medicare Other

## 2017-09-17 ENCOUNTER — Ambulatory Visit
Admission: RE | Admit: 2017-09-17 | Discharge: 2017-09-17 | Disposition: A | Discharge status: Home / Self Care | Payer: Medicare Other | Source: Ambulatory Visit | Attending: Radiation Oncology | Admitting: Radiation Oncology

## 2017-09-17 DIAGNOSIS — R911 Solitary pulmonary nodule: Secondary | ICD-10-CM

## 2017-09-17 NOTE — Progress Notes (Signed)
  Radiation Oncology         (336) [phone] ________________________________  Name: Tamara Adams MRN: [id number]  Date: 09/17/2017  DOB: [birth year]  Stereotactic Body Radiotherapy Treatment Procedure Note  NARRATIVE:  Tamara Adams was brought to the stereotactic radiation treatment machine and placed supine on the CT couch. The patient was set up for stereotactic body radiotherapy on the body fix pillow.  3D TREATMENT PLANNING AND DOSIMETRY:  The patient's radiation plan was reviewed and approved prior to starting treatment.  It showed 3-dimensional radiation distributions overlaid onto the planning CT.  [facility] for the target structures as well as the organs at risk were reviewed. The documentation of this is filed in the radiation oncology EMR.  SIMULATION VERIFICATION:  The patient underwent CT imaging on the treatment unit.  These were carefully aligned to document that the ablative radiation dose would cover the target volume and maximally spare the nearby organs at risk according to the planned distribution.  SPECIAL TREATMENT PROCEDURE: Tamara Adams received high dose ablative stereotactic body radiotherapy to the planned target volume without unforeseen complications. Treatment was delivered uneventfully. The high doses associated with stereotactic body radiotherapy and the significant potential risks require careful treatment set up and patient monitoring constituting a special treatment procedure   STEREOTACTIC TREATMENT MANAGEMENT:  Following delivery, the patient was evaluated clinically. The patient tolerated treatment without significant acute effects, and was discharged to home in stable condition.    PLAN: Continue treatment as planned.  ________________________________  Blair Promise, PhD, MD

## 2017-09-18 ENCOUNTER — Ambulatory Visit: Payer: Medicare Other | Admitting: Radiation Oncology

## 2017-09-18 ENCOUNTER — Inpatient Hospital Stay (HOSPITAL_BASED_OUTPATIENT_CLINIC_OR_DEPARTMENT_OTHER): Payer: Medicare Other | Admitting: Family

## 2017-09-18 ENCOUNTER — Other Ambulatory Visit: Payer: Self-pay

## 2017-09-18 ENCOUNTER — Inpatient Hospital Stay: Payer: Medicare Other

## 2017-09-18 VITALS — BP 155/59 | HR 70 | Temp 98.1°F | Resp 22 | Wt 91.8 lb

## 2017-09-18 DIAGNOSIS — R911 Solitary pulmonary nodule: Secondary | ICD-10-CM | POA: Diagnosis not present

## 2017-09-18 DIAGNOSIS — D509 Iron deficiency anemia, unspecified: Secondary | ICD-10-CM

## 2017-09-18 DIAGNOSIS — R5383 Other fatigue: Secondary | ICD-10-CM | POA: Diagnosis not present

## 2017-09-18 DIAGNOSIS — K219 Gastro-esophageal reflux disease without esophagitis: Secondary | ICD-10-CM

## 2017-09-18 DIAGNOSIS — I498 Other specified cardiac arrhythmias: Secondary | ICD-10-CM

## 2017-09-18 DIAGNOSIS — K5900 Constipation, unspecified: Secondary | ICD-10-CM | POA: Diagnosis not present

## 2017-09-18 DIAGNOSIS — E785 Hyperlipidemia, unspecified: Secondary | ICD-10-CM

## 2017-09-18 DIAGNOSIS — I252 Old myocardial infarction: Secondary | ICD-10-CM | POA: Diagnosis not present

## 2017-09-18 DIAGNOSIS — F418 Other specified anxiety disorders: Secondary | ICD-10-CM | POA: Diagnosis not present

## 2017-09-18 DIAGNOSIS — Z9049 Acquired absence of other specified parts of digestive tract: Secondary | ICD-10-CM

## 2017-09-18 DIAGNOSIS — I10 Essential (primary) hypertension: Secondary | ICD-10-CM

## 2017-09-18 DIAGNOSIS — Z8041 Family history of malignant neoplasm of ovary: Secondary | ICD-10-CM

## 2017-09-18 DIAGNOSIS — R109 Unspecified abdominal pain: Secondary | ICD-10-CM

## 2017-09-18 DIAGNOSIS — Z79899 Other long term (current) drug therapy: Secondary | ICD-10-CM

## 2017-09-18 DIAGNOSIS — J449 Chronic obstructive pulmonary disease, unspecified: Secondary | ICD-10-CM | POA: Diagnosis not present

## 2017-09-18 DIAGNOSIS — M129 Arthropathy, unspecified: Secondary | ICD-10-CM

## 2017-09-18 DIAGNOSIS — Z86718 Personal history of other venous thrombosis and embolism: Secondary | ICD-10-CM

## 2017-09-18 DIAGNOSIS — Z87442 Personal history of urinary calculi: Secondary | ICD-10-CM

## 2017-09-18 DIAGNOSIS — D508 Other iron deficiency anemias: Secondary | ICD-10-CM

## 2017-09-18 DIAGNOSIS — Z87891 Personal history of nicotine dependence: Secondary | ICD-10-CM

## 2017-09-18 DIAGNOSIS — Z8701 Personal history of pneumonia (recurrent): Secondary | ICD-10-CM

## 2017-09-18 DIAGNOSIS — Z90710 Acquired absence of both cervix and uterus: Secondary | ICD-10-CM

## 2017-09-18 DIAGNOSIS — Z8719 Personal history of other diseases of the digestive system: Secondary | ICD-10-CM

## 2017-09-18 DIAGNOSIS — C3490 Malignant neoplasm of unspecified part of unspecified bronchus or lung: Secondary | ICD-10-CM

## 2017-09-18 DIAGNOSIS — Z8711 Personal history of peptic ulcer disease: Secondary | ICD-10-CM

## 2017-09-18 LAB — CBC WITH DIFFERENTIAL (CANCER CENTER ONLY)
BASOS ABS: 0.1 10*3/uL (ref 0.0–0.1)
BASOS PCT: 1 %
Eosinophils Absolute: 0.4 10*3/uL (ref 0.0–0.5)
Eosinophils Relative: 8 %
HEMATOCRIT: 32.4 % — AB (ref 34.8–46.6)
Hemoglobin: 10.1 g/dL — ABNORMAL LOW (ref 11.6–15.9)
Lymphocytes Relative: 26 %
Lymphs Abs: 1.4 10*3/uL (ref 0.9–3.3)
MCH: 28.4 pg (ref 26.0–34.0)
MCHC: 31.2 g/dL — ABNORMAL LOW (ref 32.0–36.0)
MCV: 91 fL (ref 81.0–101.0)
MONO ABS: 0.5 10*3/uL (ref 0.1–0.9)
Monocytes Relative: 10 %
NEUTROS ABS: 2.9 10*3/uL (ref 1.5–6.5)
Neutrophils Relative %: 55 %
Platelet Count: 344 10*3/uL (ref 145–400)
RBC: 3.56 MIL/uL — AB (ref 3.70–5.32)
RDW: 16.9 % — AB (ref 11.1–15.7)
WBC: 5.3 10*3/uL (ref 3.9–10.0)

## 2017-09-18 LAB — CMP (CANCER CENTER ONLY)
ALBUMIN: 3.4 g/dL — AB (ref 3.5–5.0)
ALT: 18 U/L (ref 0–55)
AST: 24 U/L (ref 5–34)
Alkaline Phosphatase: 78 U/L (ref 40–150)
Anion gap: 9 (ref 3–11)
BILIRUBIN TOTAL: 0.2 mg/dL (ref 0.2–1.2)
BUN: 7 mg/dL (ref 7–26)
CO2: 30 mmol/L — ABNORMAL HIGH (ref 22–29)
Calcium: 9.2 mg/dL (ref 8.4–10.4)
Chloride: 99 mmol/L (ref 98–109)
Creatinine: 0.78 mg/dL (ref 0.60–1.10)
GFR, Est AFR Am: >60 mL/min (ref >60)
GFR, Estimated: >60 mL/min (ref >60)
GLUCOSE: 124 mg/dL (ref 70–140)
POTASSIUM: 5.6 mmol/L — AB (ref 3.5–5.1)
Sodium: 138 mmol/L (ref 136–145)
Total Protein: 6.5 g/dL (ref 6.4–8.3)

## 2017-09-18 NOTE — Progress Notes (Signed)
Hematology and Oncology Follow Up Visit  Tamara Adams [id number] 1940/12/05 77 y.o. 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 She had her first of 3 SRBT treatments yesterday. She is feeling fatigued and weak. No redness or edema at the radiation sites.  Her SOB seems to be stable on 98 L [place]. She has had no cough.  No fever, chills, rash, dizziness, chest pain, palpitations or changes in bowel or bladder habits.  She has intermittent abdominal pain due to constipation. She uses Mirilax and laxatives as needed.  No bleeding, bruising or petechiae.  No lymphadenopathy found on exam.  No swelling or tenderness in her extremities. 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  Her appetite is still down and has occasional nausea, no vomiting. She is trying to stay well hydrated. Her weight is stable.   ECOG Performance Status: 2 - Symptomatic, <50% confined to bed  Medications:  Allergies as of 09/18/2017      Reactions   Maxidex [dexamethasone] Other (See Comments)   DEHYDRATION AND HEART RACING   Advair Diskus [fluticasone-salmeterol] Other (See Comments)   No benefit with lungs (INEFFECTIVE)   Remeron [mirtazapine] Other (See Comments)   CAUSED NIGHTMARES   Trazodone And Nefazodone Other (See Comments)   Heart pounding   Tylox [oxycodone-acetaminophen] Itching      Medication List        Accurate as of 09/18/17  2:14 PM. Always use your most recent med list.          acetaminophen 325 MG tablet Commonly known as:  TYLENOL Take 325-650 mg by mouth every 6 (six) hours as needed for headache.   albuterol (2.5 MG/3ML) 0.083% nebulizer solution Commonly known as:  PROVENTIL USE 1 VIAL (3 ML) VIA NEBULIZER EVERY 6 HOURS AS NEEDED FOR WHEEZING OR SHORTNESS OF BREATH   AMBULATORY  NON FORMULARY MEDICATION Medication Name: Needs oxygen concentrator set to 6 liter per minutes as she has a long tubing on her machine.  Fax to [facility] Medication Name: GI cocktail: 189mL Lidocaine2%, 11ml Maalox, 70ml Donnatol. Ok to take twice daily.   clopidogrel 75 MG tablet Commonly known as:  PLAVIX Take 1 tablet by mouth daily   dronabinol 2.5 MG capsule Commonly known as:  MARINOL Take 1 capsule (2.5 mg total) by mouth 2 (two) times daily.   guaiFENesin 600 MG 12 hr tablet Commonly known as:  MUCINEX Take 1 tablet (600 mg total) by mouth 2 (two) times daily.   HYDROcodone-acetaminophen 10-325 MG tablet Commonly known as:  NORCO Take 1 tablet by mouth every 6 (six) hours as needed.   INCRUSE ELLIPTA 62.5 MCG/INH Aepb Generic drug:  umeclidinium bromide USE 1 INHALATION DAILY   lidocaine 2 % solution Commonly known as:  XYLOCAINE Use as directed 20 mLs in the mouth or throat 2 (two) times daily as needed.   losartan 50 MG tablet Commonly known as:  COZAAR Take 1 tablet (50 mg total) by mouth daily.   metoprolol tartrate 50 MG tablet Commonly known as:  LOPRESSOR Take 1 tablet (50 mg total) by mouth 2 (two) times daily.   multivitamin with minerals Tabs tablet Take 1 tablet by mouth daily. Pt uses One-A-Day brand   omeprazole 40 MG  capsule Commonly known as:  PRILOSEC Take 1 capsule (40 mg total) by mouth 2 (two) times daily.   ondansetron 8 MG disintegrating tablet Commonly known as:  ZOFRAN-ODT PLACE 1 TABLET ON THE TONGUE EVERY 8 HOURS AS NEEDED FOR NAUSEA   polyethylene glycol packet Commonly known as:  MIRALAX / GLYCOLAX Take 17 g by mouth daily.   promethazine 25 MG tablet Commonly known as:  PHENERGAN Take 1 tablet (25 mg total) by mouth every 8 (eight) hours as needed for nausea or vomiting.   ranitidine 150 MG tablet Commonly known as:  ZANTAC Take 1 tablet (150 mg total) by mouth daily.   traMADol 50 MG  tablet Commonly known as:  ULTRAM Take 1 tablet (50 mg total) by mouth every 6 (six) hours as needed (pain).       Allergies:  Allergies  Allergen Reactions  . Maxidex [Dexamethasone] Other (See Comments)    DEHYDRATION AND HEART RACING  . Advair Diskus [Fluticasone-Salmeterol] Other (See Comments)    No benefit with lungs (INEFFECTIVE)  . Remeron [Mirtazapine] Other (See Comments)    CAUSED NIGHTMARES  . Trazodone And Nefazodone Other (See Comments)    Heart pounding  . Tylox [Oxycodone-Acetaminophen] Itching    Past Medical History, Surgical history, Social history, and Family History were reviewed and updated.  Review of Systems: All other 10 point review of systems is negative.   Physical Exam:  weight is 91 lb 12 oz (41.6 kg). Her oral temperature is 98.1 F (36.7 C). Her blood pressure is 155/59 (abnormal) and her pulse is 70. Her respiration is 22 (abnormal) and oxygen saturation is 100%.   Wt Readings from Last 3 Encounters:  09/18/17 91 lb 12 oz (41.6 kg)  09/16/17 92 lb 8 oz (42 kg)  08/29/17 89 lb (40.4 kg)    Ocular: Sclerae unicteric, pupils equal, round and reactive to light Ear-nose-throat: Oropharynx clear, dentition fair Lymphatic: No cervical, supraclavicular or axillary adenopathy Lungs no rales or rhonchi, good excursion bilaterally Heart regular rate and rhythm, no murmur appreciated Abd soft, nontender, positive bowel sounds, no liver or spleen tip palpated on exam, no fluid wave  MSK no focal spinal tenderness, no joint edema Neuro: non-focal, well-oriented, appropriate affect Breasts: Deferred   Lab Results  Component Value Date   WBC 5.3 09/18/2017   HGB 8.1 (L) 08/05/2017   HCT 32.4 (L) 09/18/2017   MCV 91.0 09/18/2017   PLT 344 09/18/2017   Lab Results  Component Value Date   FERRITIN 24 08/27/2017   IRON 28 (L) 08/27/2017   TIBC 350 08/27/2017   UIBC 322 08/27/2017   IRONPCTSAT 8 (L) 08/27/2017   Lab Results  Component Value  Date   RETICCTPCT 1.2 08/27/2017   RBC 3.56 (L) 09/18/2017   No results found for: KPAFRELGTCHN, LAMBDASER, KAPLAMBRATIO No results found for: IGGSERUM, IGA, IGMSERUM No results found for: Kathrynn Ducking, MSPIKE, SPEI   Chemistry      Component Value Date/Time   NA 138 08/27/2017 1029   K 5.7 (H) 08/27/2017 1029   CL 99 08/27/2017 1029   CO2 29 08/27/2017 1029   BUN 6 (L) 08/27/2017 1029   CREATININE 0.74 08/27/2017 1029   CREATININE 0.72 08/05/2017 1440      Component Value Date/Time   CALCIUM 9.0 08/27/2017 1029   ALKPHOS 87 08/27/2017 1029   AST 23 08/27/2017 1029   ALT 9 08/27/2017 1029   BILITOT <0.2 (L) 08/27/2017 1029  Impression and Plan: Ms. Faulstich is a very pleasant 77 yo caucasian female with history of irn deficiency and DVT of the left lower extremity.  Hgb today is up to 10.1 after receiving 2 doses of IV iron last month. We will see what her labs from today show and bring her back for infusion if needed.  She also has a nodule in the left lung apex. She had her first of 3 SBRT treatments yesterday and tolerated it well. She is fatigued and feeling weak today.  We will plan to see her back in another 2 months and repeat a PET scan at that time to evaluate her response to treatment.  She is in agreement with the plan and will contact our office with any questions or concerns. We can certainly see her sooner if need be.   Laverna Peace, NP 2/27/20192:14 PM

## 2017-09-19 ENCOUNTER — Ambulatory Visit
Admission: RE | Admit: 2017-09-19 | Discharge: 2017-09-19 | Disposition: A | Discharge status: Home / Self Care | Payer: Medicare Other | Source: Ambulatory Visit | Attending: Radiation Oncology | Admitting: Radiation Oncology

## 2017-09-19 DIAGNOSIS — R911 Solitary pulmonary nodule: Secondary | ICD-10-CM

## 2017-09-19 LAB — FERRITIN: FERRITIN: 893 ng/mL — AB (ref 9–269)

## 2017-09-19 LAB — IRON AND TIBC
Iron: 186 ug/dL — ABNORMAL HIGH (ref 41–142)
SATURATION RATIOS: 80 % — AB (ref 21–57)
TIBC: 231 ug/dL — ABNORMAL LOW (ref 236–444)
UIBC: 46 ug/dL

## 2017-09-19 NOTE — Progress Notes (Signed)
  Radiation Oncology         (336) [phone] ________________________________  Name: Tamara Adams MRN: [id number]  Date: 09/19/2017  DOB: 1940/12/27  Stereotactic Body Radiotherapy Treatment Procedure Note  NARRATIVE:  Tamara Adams was brought to the stereotactic radiation treatment machine and placed supine on the CT couch. The patient was set up for stereotactic body radiotherapy on the body fix pillow.  3D TREATMENT PLANNING AND DOSIMETRY:  The patient's radiation plan was reviewed and approved prior to starting treatment.  It showed 3-dimensional radiation distributions overlaid onto the planning CT.  The [facility] for the target structures as well as the organs at risk were reviewed. The documentation of this is filed in the radiation oncology EMR.  SIMULATION VERIFICATION:  The patient underwent CT imaging on the treatment unit.  These were carefully aligned to document that the ablative radiation dose would cover the target volume and maximally spare the nearby organs at risk according to the planned distribution.  SPECIAL TREATMENT PROCEDURE: Tamara Adams received high dose ablative stereotactic body radiotherapy to the planned target volume without unforeseen complications. Treatment was delivered uneventfully. The high doses associated with stereotactic body radiotherapy and the significant potential risks require careful treatment set up and patient monitoring constituting a special treatment procedure   STEREOTACTIC TREATMENT MANAGEMENT:  Following delivery, the patient was evaluated clinically. The patient tolerated treatment without significant acute effects, and was discharged to home in stable condition.    PLAN: Continue treatment as planned.  ________________________________  Blair Promise, PhD, MD  This document serves as a record of services personally performed by Blair Promise, PhD, MD. It was created on his behalf by Margit Banda, a trained medical scribe. The creation of  this record is based on the scribe's personal observations and the provider's statements to them.

## 2017-09-20 ENCOUNTER — Ambulatory Visit: Payer: Medicare Other | Admitting: Radiation Oncology

## 2017-09-20 ENCOUNTER — Other Ambulatory Visit: Payer: Self-pay

## 2017-09-20 ENCOUNTER — Emergency Department (HOSPITAL_BASED_OUTPATIENT_CLINIC_OR_DEPARTMENT_OTHER): Payer: Medicare Other

## 2017-09-20 ENCOUNTER — Inpatient Hospital Stay (HOSPITAL_BASED_OUTPATIENT_CLINIC_OR_DEPARTMENT_OTHER)
Admission: EM | Admit: 2017-09-20 | Discharge: 2017-09-22 | DRG: 193 | Disposition: A | Discharge status: Home Health | Payer: Medicare Other | Attending: Internal Medicine | Admitting: Internal Medicine

## 2017-09-20 ENCOUNTER — Encounter (HOSPITAL_BASED_OUTPATIENT_CLINIC_OR_DEPARTMENT_OTHER): Payer: Self-pay | Admitting: Emergency Medicine

## 2017-09-20 DIAGNOSIS — L89152 Pressure ulcer of sacral region, stage 2: Secondary | ICD-10-CM | POA: Diagnosis not present

## 2017-09-20 DIAGNOSIS — Z8249 Family history of ischemic heart disease and other diseases of the circulatory system: Secondary | ICD-10-CM

## 2017-09-20 DIAGNOSIS — Z85118 Personal history of other malignant neoplasm of bronchus and lung: Secondary | ICD-10-CM

## 2017-09-20 DIAGNOSIS — Z9981 Dependence on supplemental oxygen: Secondary | ICD-10-CM

## 2017-09-20 DIAGNOSIS — E785 Hyperlipidemia, unspecified: Secondary | ICD-10-CM | POA: Diagnosis not present

## 2017-09-20 DIAGNOSIS — J441 Chronic obstructive pulmonary disease with (acute) exacerbation: Secondary | ICD-10-CM

## 2017-09-20 DIAGNOSIS — I252 Old myocardial infarction: Secondary | ICD-10-CM

## 2017-09-20 DIAGNOSIS — Z961 Presence of intraocular lens: Secondary | ICD-10-CM | POA: Diagnosis not present

## 2017-09-20 DIAGNOSIS — J9611 Chronic respiratory failure with hypoxia: Secondary | ICD-10-CM

## 2017-09-20 DIAGNOSIS — Z9841 Cataract extraction status, right eye: Secondary | ICD-10-CM

## 2017-09-20 DIAGNOSIS — G894 Chronic pain syndrome: Secondary | ICD-10-CM

## 2017-09-20 DIAGNOSIS — Z8041 Family history of malignant neoplasm of ovary: Secondary | ICD-10-CM | POA: Diagnosis not present

## 2017-09-20 DIAGNOSIS — I480 Paroxysmal atrial fibrillation: Secondary | ICD-10-CM

## 2017-09-20 DIAGNOSIS — Z90710 Acquired absence of both cervix and uterus: Secondary | ICD-10-CM | POA: Diagnosis not present

## 2017-09-20 DIAGNOSIS — L8990 Pressure ulcer of unspecified site, unspecified stage: Secondary | ICD-10-CM

## 2017-09-20 DIAGNOSIS — I10 Essential (primary) hypertension: Secondary | ICD-10-CM | POA: Diagnosis not present

## 2017-09-20 DIAGNOSIS — J449 Chronic obstructive pulmonary disease, unspecified: Secondary | ICD-10-CM | POA: Diagnosis present

## 2017-09-20 DIAGNOSIS — Z8711 Personal history of peptic ulcer disease: Secondary | ICD-10-CM | POA: Diagnosis not present

## 2017-09-20 DIAGNOSIS — Z9851 Tubal ligation status: Secondary | ICD-10-CM

## 2017-09-20 DIAGNOSIS — I160 Hypertensive urgency: Secondary | ICD-10-CM

## 2017-09-20 DIAGNOSIS — Z9842 Cataract extraction status, left eye: Secondary | ICD-10-CM

## 2017-09-20 DIAGNOSIS — K219 Gastro-esophageal reflux disease without esophagitis: Secondary | ICD-10-CM | POA: Diagnosis not present

## 2017-09-20 DIAGNOSIS — J440 Chronic obstructive pulmonary disease with acute lower respiratory infection: Secondary | ICD-10-CM | POA: Diagnosis not present

## 2017-09-20 DIAGNOSIS — Z973 Presence of spectacles and contact lenses: Secondary | ICD-10-CM

## 2017-09-20 DIAGNOSIS — Y95 Nosocomial condition: Secondary | ICD-10-CM | POA: Diagnosis present

## 2017-09-20 DIAGNOSIS — Z83511 Family history of glaucoma: Secondary | ICD-10-CM

## 2017-09-20 DIAGNOSIS — J9621 Acute and chronic respiratory failure with hypoxia: Secondary | ICD-10-CM | POA: Diagnosis not present

## 2017-09-20 DIAGNOSIS — Z9049 Acquired absence of other specified parts of digestive tract: Secondary | ICD-10-CM

## 2017-09-20 DIAGNOSIS — Z87442 Personal history of urinary calculi: Secondary | ICD-10-CM | POA: Diagnosis not present

## 2017-09-20 DIAGNOSIS — Z885 Allergy status to narcotic agent status: Secondary | ICD-10-CM

## 2017-09-20 DIAGNOSIS — L89151 Pressure ulcer of sacral region, stage 1: Secondary | ICD-10-CM | POA: Diagnosis present

## 2017-09-20 DIAGNOSIS — Z888 Allergy status to other drugs, medicaments and biological substances status: Secondary | ICD-10-CM

## 2017-09-20 DIAGNOSIS — R079 Chest pain, unspecified: Secondary | ICD-10-CM | POA: Diagnosis present

## 2017-09-20 DIAGNOSIS — J189 Pneumonia, unspecified organism: Principal | ICD-10-CM | POA: Diagnosis present

## 2017-09-20 DIAGNOSIS — Z800 Family history of malignant neoplasm of digestive organs: Secondary | ICD-10-CM | POA: Diagnosis not present

## 2017-09-20 DIAGNOSIS — R062 Wheezing: Secondary | ICD-10-CM | POA: Diagnosis not present

## 2017-09-20 DIAGNOSIS — Z87891 Personal history of nicotine dependence: Secondary | ICD-10-CM

## 2017-09-20 LAB — COMPREHENSIVE METABOLIC PANEL
ALT: 15 U/L (ref 14–54)
AST: 26 U/L (ref 15–41)
Albumin: 3.7 g/dL (ref 3.5–5.0)
Alkaline Phosphatase: 74 U/L (ref 38–126)
Anion gap: 10 (ref 5–15)
BUN: 6 mg/dL (ref 6–20)
CHLORIDE: 92 mmol/L — AB (ref 101–111)
CO2: 29 mmol/L (ref 22–32)
Calcium: 8.9 mg/dL (ref 8.9–10.3)
Creatinine, Ser: 0.68 mg/dL (ref 0.44–1.00)
Glucose, Bld: 121 mg/dL — ABNORMAL HIGH (ref 65–99)
Potassium: 4.1 mmol/L (ref 3.5–5.1)
SODIUM: 131 mmol/L — AB (ref 135–145)
Total Bilirubin: 0.2 mg/dL — ABNORMAL LOW (ref 0.3–1.2)
Total Protein: 6.9 g/dL (ref 6.5–8.1)

## 2017-09-20 LAB — CBC WITH DIFFERENTIAL/PLATELET
BASOS ABS: 0.1 10*3/uL (ref 0.0–0.1)
BASOS PCT: 1 %
EOS ABS: 0.2 10*3/uL (ref 0.0–0.7)
EOS PCT: 3 %
HCT: 33.6 % — ABNORMAL LOW (ref 36.0–46.0)
Hemoglobin: 10.8 g/dL — ABNORMAL LOW (ref 12.0–15.0)
LYMPHS PCT: 16 %
Lymphs Abs: 1 10*3/uL (ref 0.7–4.0)
MCH: 28.2 pg (ref 26.0–34.0)
MCHC: 32.1 g/dL (ref 30.0–36.0)
MCV: 87.7 fL (ref 78.0–100.0)
MONO ABS: 0.5 10*3/uL (ref 0.1–1.0)
Monocytes Relative: 8 %
Neutro Abs: 4.4 10*3/uL (ref 1.7–7.7)
Neutrophils Relative %: 72 %
PLATELETS: 352 10*3/uL (ref 150–400)
RBC: 3.83 MIL/uL — AB (ref 3.87–5.11)
RDW: 16.3 % — AB (ref 11.5–15.5)
WBC: 6.1 10*3/uL (ref 4.0–10.5)

## 2017-09-20 LAB — I-STAT VENOUS BLOOD GAS, ED
Acid-Base Excess: 5 mmol/L — ABNORMAL HIGH (ref 0.0–2.0)
Bicarbonate: 30.2 mmol/L — ABNORMAL HIGH (ref 20.0–28.0)
O2 Saturation: 42 %
PCO2 VEN: 47.7 mmHg (ref 44.0–60.0)
TCO2: 32 mmol/L (ref 22–32)
pH, Ven: 7.408 (ref 7.250–7.430)
pO2, Ven: 23 mmHg — CL (ref 32.0–45.0)

## 2017-09-20 LAB — I-STAT CG4 LACTIC ACID, ED: Lactic Acid, Venous: 1.07 mmol/L (ref 0.5–1.9)

## 2017-09-20 LAB — TROPONIN I
Troponin I: <0.03 ng/mL (ref <0.03)
Troponin I: <0.03 ng/mL (ref <0.03)

## 2017-09-20 LAB — EXPECTORATED SPUTUM ASSESSMENT W GRAM STAIN, RFLX TO RESP C

## 2017-09-20 LAB — OCCULT BLOOD X 1 CARD TO LAB, STOOL: Fecal Occult Bld: NEGATIVE

## 2017-09-20 LAB — LIPASE, BLOOD: LIPASE: 28 U/L (ref 11–51)

## 2017-09-20 MED ORDER — CEFEPIME HCL 1 G IJ SOLR
INTRAMUSCULAR | Status: AC
  Filled 2017-09-20: qty 1

## 2017-09-20 MED ORDER — TRAMADOL HCL 50 MG PO TABS: 50.0000 mg | ORAL_TABLET | Freq: Four times a day (QID) | ORAL | Status: DC | PRN

## 2017-09-20 MED ORDER — IPRATROPIUM-ALBUTEROL 0.5-2.5 (3) MG/3ML IN SOLN
3.0000 mL | Freq: Four times a day (QID) | RESPIRATORY_TRACT | Status: DC
  Administered 2017-09-20: 3 mL via RESPIRATORY_TRACT
  Filled 2017-09-20: qty 3

## 2017-09-20 MED ORDER — POLYETHYLENE GLYCOL 3350 17 G PO PACK
17.0000 g | PACK | Freq: Every day | ORAL | Status: DC
  Administered 2017-09-20 – 2017-09-22 (×3): 17 g via ORAL
  Filled 2017-09-20 (×3): qty 1

## 2017-09-20 MED ORDER — METHYLPREDNISOLONE SODIUM SUCC 125 MG IJ SOLR
125.0000 mg | Freq: Once | INTRAMUSCULAR | Status: AC
  Administered 2017-09-20: 125 mg via INTRAVENOUS
  Filled 2017-09-20: qty 2

## 2017-09-20 MED ORDER — PANTOPRAZOLE SODIUM 40 MG PO TBEC
40.0000 mg | DELAYED_RELEASE_TABLET | Freq: Every day | ORAL | Status: DC
  Administered 2017-09-20 – 2017-09-22 (×3): 40 mg via ORAL
  Filled 2017-09-20 (×3): qty 1

## 2017-09-20 MED ORDER — PROMETHAZINE HCL 25 MG PO TABS
25.0000 mg | ORAL_TABLET | Freq: Three times a day (TID) | ORAL | Status: DC | PRN
  Administered 2017-09-20 – 2017-09-21 (×2): 25 mg via ORAL
  Filled 2017-09-20 (×2): qty 1

## 2017-09-20 MED ORDER — IPRATROPIUM BROMIDE 0.02 % IN SOLN
0.5000 mg | Freq: Once | RESPIRATORY_TRACT | Status: AC
  Administered 2017-09-20: 0.5 mg via RESPIRATORY_TRACT
  Filled 2017-09-20: qty 2.5

## 2017-09-20 MED ORDER — SODIUM CHLORIDE 0.9% FLUSH
3.0000 mL | Freq: Two times a day (BID) | INTRAVENOUS | Status: DC
  Administered 2017-09-20 – 2017-09-22 (×4): 3 mL via INTRAVENOUS

## 2017-09-20 MED ORDER — METOPROLOL TARTRATE 50 MG PO TABS
50.0000 mg | ORAL_TABLET | Freq: Two times a day (BID) | ORAL | Status: DC
  Administered 2017-09-20 – 2017-09-22 (×5): 50 mg via ORAL
  Filled 2017-09-20 (×5): qty 1

## 2017-09-20 MED ORDER — ALBUTEROL SULFATE (2.5 MG/3ML) 0.083% IN NEBU
5.0000 mg | INHALATION_SOLUTION | Freq: Once | RESPIRATORY_TRACT | Status: AC
Start: 2017-09-20 — End: 2017-09-20
  Administered 2017-09-20: 5 mg via RESPIRATORY_TRACT
  Filled 2017-09-20: qty 6

## 2017-09-20 MED ORDER — ONDANSETRON HCL 4 MG/2ML IJ SOLN
INTRAMUSCULAR | Status: AC
  Filled 2017-09-20: qty 2

## 2017-09-20 MED ORDER — BOOST / RESOURCE BREEZE PO LIQD CUSTOM
1.0000 | Freq: Three times a day (TID) | ORAL | Status: DC
  Administered 2017-09-22: 1 via ORAL

## 2017-09-20 MED ORDER — VANCOMYCIN HCL IN DEXTROSE 750-5 MG/150ML-% IV SOLN: 750.0000 mg | INTRAVENOUS | Status: DC

## 2017-09-20 MED ORDER — SODIUM CHLORIDE 0.9 % IV SOLN
1.0000 g | Freq: Once | INTRAVENOUS | Status: AC
  Administered 2017-09-20: 1 g via INTRAVENOUS

## 2017-09-20 MED ORDER — METHYLPREDNISOLONE SODIUM SUCC 125 MG IJ SOLR
80.0000 mg | Freq: Two times a day (BID) | INTRAMUSCULAR | Status: DC
  Administered 2017-09-20 – 2017-09-21 (×2): 80 mg via INTRAVENOUS
  Filled 2017-09-20 (×3): qty 2

## 2017-09-20 MED ORDER — GUAIFENESIN ER 600 MG PO TB12
600.0000 mg | ORAL_TABLET | Freq: Two times a day (BID) | ORAL | Status: DC
  Administered 2017-09-20 – 2017-09-22 (×5): 600 mg via ORAL
  Filled 2017-09-20 (×5): qty 1

## 2017-09-20 MED ORDER — IPRATROPIUM-ALBUTEROL 0.5-2.5 (3) MG/3ML IN SOLN
3.0000 mL | Freq: Three times a day (TID) | RESPIRATORY_TRACT | Status: DC
  Administered 2017-09-20 – 2017-09-21 (×2): 3 mL via RESPIRATORY_TRACT
  Filled 2017-09-20 (×2): qty 3

## 2017-09-20 MED ORDER — IOPAMIDOL (ISOVUE-370) INJECTION 76%
100.0000 mL | Freq: Once | INTRAVENOUS | Status: AC | PRN
  Administered 2017-09-20: 100 mL via INTRAVENOUS

## 2017-09-20 MED ORDER — MORPHINE SULFATE (PF) 4 MG/ML IV SOLN
4.0000 mg | Freq: Once | INTRAVENOUS | Status: AC
  Administered 2017-09-20: 4 mg via INTRAVENOUS
  Filled 2017-09-20: qty 1

## 2017-09-20 MED ORDER — ADULT MULTIVITAMIN W/MINERALS CH
1.0000 | ORAL_TABLET | Freq: Every day | ORAL | Status: DC
  Administered 2017-09-20 – 2017-09-22 (×3): 1 via ORAL
  Filled 2017-09-20 (×3): qty 1

## 2017-09-20 MED ORDER — SODIUM CHLORIDE 0.9 % IV SOLN
1.0000 g | Freq: Two times a day (BID) | INTRAVENOUS | Status: DC
  Administered 2017-09-20 – 2017-09-21 (×2): 1 g via INTRAVENOUS
  Filled 2017-09-20 (×2): qty 1

## 2017-09-20 MED ORDER — LOSARTAN POTASSIUM 50 MG PO TABS
50.0000 mg | ORAL_TABLET | Freq: Every day | ORAL | Status: DC
  Administered 2017-09-20 – 2017-09-22 (×3): 50 mg via ORAL
  Filled 2017-09-20 (×3): qty 1

## 2017-09-20 MED ORDER — ONDANSETRON HCL 4 MG/2ML IJ SOLN
4.0000 mg | Freq: Once | INTRAMUSCULAR | Status: AC
  Administered 2017-09-20: 4 mg via INTRAVENOUS
  Filled 2017-09-20: qty 2

## 2017-09-20 MED ORDER — HYDROCODONE-ACETAMINOPHEN 10-325 MG PO TABS
1.0000 | ORAL_TABLET | Freq: Four times a day (QID) | ORAL | Status: DC | PRN
  Administered 2017-09-20 – 2017-09-21 (×3): 1 via ORAL
  Filled 2017-09-20 (×3): qty 1

## 2017-09-20 MED ORDER — ORAL CARE MOUTH RINSE
15.0000 mL | Freq: Two times a day (BID) | OROMUCOSAL | Status: DC
  Administered 2017-09-20 – 2017-09-22 (×2): 15 mL via OROMUCOSAL

## 2017-09-20 MED ORDER — ONDANSETRON HCL 4 MG/2ML IJ SOLN
4.0000 mg | Freq: Once | INTRAMUSCULAR | Status: AC
  Administered 2017-09-20: 4 mg via INTRAVENOUS

## 2017-09-20 MED ORDER — SODIUM CHLORIDE 0.9% FLUSH: 3.0000 mL | INTRAVENOUS | Status: DC | PRN

## 2017-09-20 MED ORDER — HYDRALAZINE HCL 20 MG/ML IJ SOLN
10.0000 mg | Freq: Once | INTRAMUSCULAR | Status: AC
  Administered 2017-09-20: 10 mg via INTRAVENOUS
  Filled 2017-09-20: qty 1

## 2017-09-20 MED ORDER — SODIUM CHLORIDE 0.9 % IV SOLN: 250.0000 mL | INTRAVENOUS | Status: DC | PRN

## 2017-09-20 MED ORDER — VANCOMYCIN HCL IN DEXTROSE 1-5 GM/200ML-% IV SOLN
1000.0000 mg | Freq: Once | INTRAVENOUS | Status: AC
  Administered 2017-09-20: 1000 mg via INTRAVENOUS
  Filled 2017-09-20: qty 200

## 2017-09-20 NOTE — ED Provider Notes (Signed)
[facility] EMERGENCY DEPARTMENT Provider Note   CSN: [id number] Arrival date & time: 09/20/17  0037     History   Chief Complaint Chief Complaint  Patient presents with  . Chest Pain    HPI Tamara Adams is a 77 y.o. female history of COPD on 8 L nasal cannula at baseline, DVT not on anticoagulation, known lung cancer status post radiation treatment yesterday, presenting with chest pain, back pain, shortness of breath.  Denies her usual radiation treatment to her lung yesterday.  Last night and had sudden onset of left-sided chest pain radiating to her back.  The pain is constant and sharp and she was unable to sleep from the pain.  Also has worsening subjective shortness of breath.  She states that she is on 8 L nasal cannula at baseline for COPD and she has been short of breath despite using her oxygen.  Denies any productive cough or fevers.  She states that she has a history of hypertension and takes medicine for it but did not take anything this morning.  Denies any numbness or weakness.   The history is provided by the patient.    Past Medical History:  Diagnosis Date  . Anemia    as a child  . Arthritis    "hands" (12/24/2014)  . Complication of anesthesia    " My blood gas dropped during surgery so I was left on a ventilator and in ICU for 3 days."  . COPD (chronic obstructive pulmonary disease) (HCC)    FVC 72%, FEV1 29%, FEV1 ratio 32% (very severe (COPD)  . COPD (chronic obstructive pulmonary disease) ([place])   . Degenerative disc disease   . Depression with anxiety   . Diverticulosis   . DVT (deep venous thrombosis) ([place])    "LLE; years ago after a surgery"  . Essential hypertension   . Fluttering sensation of heart    pt put on metoprolol as a result  . GERD (gastroesophageal reflux disease)    PMH  . H/O hiatal hernia   . Headache    "maybe weekly" (12/24/2014)  . Hyperlipidemia   . Hypertension   . Kidney stone   . Liver lesion   . MI (myocardial  infarction) ([place]) 07/19/2017  . On home oxygen therapy    "3L; sleep w/it; use it when I rest" (12/24/2014)  . Peptic ulcer   . Pinched nerve    in back  . Pneumonia   . PONV (postoperative nausea and vomiting)   . Shortness of breath dyspnea    with exertion  . Skipped heart beats   . Small bowel obstruction ([place])    "several times; OR twice" (12/24/2014)  . Tubular adenoma of colon 09/2011  . Wears glasses     Patient Active Problem List   Diagnosis Date Noted  . Nodule of left lung 08/29/2017  . IDA (iron deficiency anemia) 08/27/2017  . Non-ST elevation (NSTEMI) myocardial infarction ([place]) 08/07/2017  . Paroxysmal atrial fibrillation (HCC)   . Encounter for nasogastric (NG) tube placement   . SBO (small bowel obstruction) ([place]) 07/19/2017  . Caregiver burden 04/16/2017  . IFG (impaired fasting glucose) 10/08/2016  . Chronic pain syndrome 07/25/2016  . Physical deconditioning   . Hyponatremia   . SOB (shortness of breath)   . Constipation 03/25/2016  . Barrett's esophagus without dysplasia 12/15/2015  . Ileus ([place]) 12/02/2015  . Hypokalemia 12/02/2015  . Essential hypertension   . Esophageal reflux   . Depression with  anxiety   . Generalized abdominal pain   . Leukocytosis 01/12/2015  . Aortic atherosclerosis ([place]) 10/05/2014  . Abnormal CT of liver 02/05/2014  . Severe protein-calorie malnutrition ([place]) 01/29/2014  . Small bowel obstruction ([place]) 01/28/2014  . Chronic hypoxemic respiratory failure ([place]) 12/07/2013  . Lumbar degenerative disc disease 05/28/2013  . Gastric ulcer 04/22/2013  . Other malaise and fatigue 10/09/2012  . CHEST PAIN, PRECORDIAL 10/04/2010  . HLD (hyperlipidemia) 09/14/2010  . History of tobacco abuse 09/14/2010  . COPD (chronic obstructive pulmonary disease) ([place]) 09/14/2010  . Insomnia 09/14/2010  . PALPITATIONS 09/14/2010  . CAROTID BRUIT 09/14/2010    Past Surgical History:  Procedure Laterality Date  . ABDOMINAL ADHESION SURGERY   12/24/2014  . ABDOMINAL HYSTERECTOMY  1987   and one ovary  . CATARACT EXTRACTION W/ INTRAOCULAR LENS  IMPLANT, BILATERAL Right 06/22/2012   Dr. Lester Kinsman.   Marland Kitchen CATARACT EXTRACTION W/PHACO Left 06/12/2012   Dr. Boykin Reaper  . CHOLECYSTECTOMY N/A 08/18/2013   Procedure: LAPAROSCOPIC CHOLECYSTECTOMY;  Surgeon: Harl Bowie, MD;  Location: [facility];  Service: General;  Laterality: N/A;  . DILATION AND CURETTAGE OF UTERUS    . EXPLORATORY LAPAROTOMY  12/24/2014  . HERNIA REPAIR    . INCISIONAL HERNIA REPAIR  12/24/2014  . INCISIONAL HERNIA REPAIR N/A 12/24/2014   Procedure: HERNIA REPAIR INCISIONAL;  Surgeon: Coralie Keens, MD;  Location: [facility];  Service: General;  Laterality: N/A;  . LAPAROTOMY N/A 12/24/2014   Procedure: EXPLORATORY LAPAROTOMY;  Surgeon: Coralie Keens, MD;  Location: [facility];  Service: General;  Laterality: N/A;  . LYSIS OF ADHESION N/A 12/24/2014   Procedure: LYSIS OF ADHESION;  Surgeon: Coralie Keens, MD;  Location: [facility];  Service: General;  Laterality: N/A;  . MULTIPLE TOOTH EXTRACTIONS    . OOPHORECTOMY  1992  . SMALL INTESTINE SURGERY     for a blockage, no bowel was removed, just kinked from scar tissue  . TUBAL LIGATION    . URETHRAL DILATION      OB History    No data available       Home Medications    Prior to Admission medications   Medication Sig Start Date End Date Taking? Authorizing Provider  acetaminophen (TYLENOL) 325 MG tablet Take 325-650 mg by mouth every 6 (six) hours as needed for headache.    [provider]  albuterol (PROVENTIL) (2.5 MG/3ML) 0.083% nebulizer solution USE 1 VIAL (3 ML) VIA NEBULIZER EVERY 6 HOURS AS NEEDED FOR WHEEZING OR SHORTNESS OF BREATH 02/11/17   [name], MD  AMBULATORY NON FORMULARY MEDICATION Medication Name: Needs oxygen concentrator set to 6 liter per minutes as she has a long tubing on her machine.  Fax to [name] 08/27/16   [name], MD  AMBULATORY NON FORMULARY MEDICATION  Medication Name: GI cocktail: 15mL Lidocaine2%, 12ml Maalox, 60ml Donnatol. Ok to take twice daily. 08/14/17   [name], MD  clopidogrel (PLAVIX) 75 MG tablet Take 1 tablet by mouth daily 08/05/17   [name], MD  dronabinol (MARINOL) 2.5 MG capsule Take 1 capsule (2.5 mg total) by mouth 2 (two) times daily. 07/25/17   [name], MD  guaiFENesin (MUCINEX) 600 MG 12 hr tablet Take 1 tablet (600 mg total) by mouth 2 (two) times daily. 04/03/16   Barton Dubois, MD  HYDROcodone-acetaminophen [facility]) 10-325 MG tablet Take 1 tablet by mouth every 6 (six) hours as needed. 09/16/17   [name], MD  INCRUSE ELLIPTA  62.5 MCG/INH AEPB USE 1 INHALATION DAILY 07/25/17   [name], MD  lidocaine (XYLOCAINE) 2 % solution Use as directed 20 mLs in the mouth or throat 2 (two) times daily as needed. Patient taking differently: Use as directed 20 mLs in the mouth or throat 2 (two) times daily as needed for mouth pain.  04/17/17   [name], MD  losartan (COZAAR) 50 MG tablet Take 1 tablet (50 mg total) by mouth daily. 08/07/17 11/05/17  Nahser, Wonda Cheng, MD  metoprolol tartrate (LOPRESSOR) 50 MG tablet Take 1 tablet (50 mg total) by mouth 2 (two) times daily. 08/05/17   [name], MD  Multiple Vitamin (MULTIVITAMIN WITH MINERALS) TABS tablet Take 1 tablet by mouth daily. Pt uses One-A-Day brand    [provider]  omeprazole (PRILOSEC) 40 MG capsule Take 1 capsule (40 mg total) by mouth 2 (two) times daily. 04/03/16   Barton Dubois, MD  ondansetron (ZOFRAN-ODT) 8 MG disintegrating tablet PLACE 1 TABLET ON THE TONGUE EVERY 8 HOURS AS NEEDED FOR NAUSEA 08/09/17   [name], MD  polyethylene glycol (MIRALAX / GLYCOLAX) packet Take 17 g by mouth daily. 07/28/17   Rosita Fire, MD  promethazine (PHENERGAN) 25 MG tablet Take 1 tablet (25 mg total) by mouth every 8 (eight) hours as needed for nausea or vomiting. 09/02/17    [name], MD  ranitidine (ZANTAC) 150 MG tablet Take 1 tablet (150 mg total) by mouth daily. 01/03/17   [name], MD  traMADol (ULTRAM) 50 MG tablet Take 1 tablet (50 mg total) by mouth every 6 (six) hours as needed (pain). 01/14/17   [name], MD    Family History Family History  Problem Relation Age of Onset  . Hypertension Mother   . Ovarian cancer Mother   . Glaucoma Mother   . Glaucoma Sister   . Pancreatic cancer Maternal Uncle   . Colon cancer Neg Hx   . Colon polyps Neg Hx   . Diabetes Neg Hx   . Kidney disease Neg Hx   . Gallbladder disease Neg Hx   . Esophageal cancer Neg Hx     Social History Social History   Tobacco Use  . Smoking status: Former Smoker    Packs/day: 0.25    Years: 57.00    Pack years: 14.25    Types: Cigarettes    Last attempt to quit: 07/25/2015    Years since quitting: 2.1  . Smokeless tobacco: Never Used  . Tobacco comment: 5 cigarettes a day  Substance Use Topics  . Alcohol use: No    Alcohol/week: 0.0 oz  . Drug use: No     Allergies   Maxidex [dexamethasone]; Advair diskus [fluticasone-salmeterol]; Remeron [mirtazapine]; Trazodone and nefazodone; and Tylox [oxycodone-acetaminophen]   Review of Systems Review of Systems  Cardiovascular: Positive for chest pain.  All other systems reviewed and are negative.    Physical Exam Updated Vital Signs BP (!) 125/55   Pulse 87   Temp 98.6 F (37 C) (Rectal)   Resp 16   Ht 5\' 4"  (1.626 m)   Wt 40.8 kg (90 lb)   SpO2 96%   BMI 15.45 kg/m   Physical Exam  Constitutional:  Chronically ill appearing   HENT:  Head: Normocephalic.  Eyes: Pupils are equal, round, and reactive to light.  Neck: Normal range of motion.  Cardiovascular: Regular rhythm and normal pulses.  Pulmonary/Chest:  Slightly tachypneic, dminished throughout, minimal wheezing  R base. No retractions   Abdominal: Soft. Bowel sounds are normal.  Musculoskeletal: Normal range  of motion.       Right lower leg: Normal.       Left lower leg: Normal.  Neurological: She is alert.  Skin: Skin is warm. Capillary refill takes less than 2 seconds.  Psychiatric: She has a normal mood and affect. Her behavior is normal.  Nursing note and vitals reviewed.    ED Treatments / Results  Labs (all labs ordered are listed, but only abnormal results are displayed) Labs Reviewed  CBC WITH DIFFERENTIAL/PLATELET - Abnormal; Notable for the following components:      Result Value   RBC 3.83 (*)    Hemoglobin 10.8 (*)    HCT 33.6 (*)    RDW 16.3 (*)    All other components within normal limits  COMPREHENSIVE METABOLIC PANEL - Abnormal; Notable for the following components:   Sodium 131 (*)    Chloride 92 (*)    Glucose, Bld 121 (*)    Total Bilirubin 0.2 (*)    All other components within normal limits  I-STAT VENOUS BLOOD GAS, ED - Abnormal; Notable for the following components:   pO2, Ven 23.0 (*)    Bicarbonate 30.2 (*)    Acid-Base Excess 5.0 (*)    All other components within normal limits  CULTURE, BLOOD (ROUTINE X 2)  CULTURE, BLOOD (ROUTINE X 2)  TROPONIN I  LIPASE, BLOOD  OCCULT BLOOD X 1 CARD TO LAB, STOOL  BLOOD GAS, VENOUS  I-STAT CG4 LACTIC ACID, ED    EKG  EKG Interpretation  Date/Time:  Friday September 20 2017 07:51:12 EST Ventricular Rate:  78 PR Interval:    QRS Duration: 81 QT Interval:  369 QTC Calculation: 421 R Axis:   76 Text Interpretation:  Sinus rhythm Anterior infarct, old Minimal ST depression, diffuse leads No significant change since last tracing Confirmed by Wandra Arthurs [phone]) on 09/20/2017 8:23:54 AM       Radiology Ct Angio Chest/abd/pel For Dissection W And/or Wo Contrast  Result Date: 09/20/2017 CLINICAL DATA:  Short of breath yesterday.  Left-sided chest pain. EXAM: CT ANGIOGRAPHY CHEST, ABDOMEN AND PELVIS TECHNIQUE: Multidetector CT imaging through the chest, abdomen and pelvis was performed using the standard protocol  during bolus administration of intravenous contrast. Multiplanar reconstructed images and MIPs were obtained and reviewed to evaluate the vascular anatomy. CONTRAST:  174mL ISOVUE-370 IOPAMIDOL (ISOVUE-370) INJECTION 76% COMPARISON:  08/19/2017 and 07/18/2017 FINDINGS: CTA CHEST FINDINGS Cardiovascular: There are atherosclerotic calcifications involving the aortic arch and descending thoracic aorta. There is some irregular soft plaque in the descending thoracic aorta. There is no evidence of aortic dissection or intramural hematoma. Maximal diameter of the ascending aorta is 3.0 cm. Atherosclerotic calcifications of the great vessels are present without significant focal narrowing. Vertebral arteries are also patent within the confines of the examination. Minimal coronary artery calcifications. No obvious acute pulmonary thromboembolism. Mediastinum/Nodes: No abnormal mediastinal adenopathy by measurement criteria. Thyroid remains heterogeneous. Esophagus is nondilated. No obvious esophageal mass. Small hiatal hernia is suspected. Lungs/Pleura: Stable left apical lung mass. Severe emphysema is stable. Patchy ground-glass and airspace opacities in both lower lobes left greater than right have improved compared to the prior PET-CT. Musculoskeletal: No acute rib fracture.  No acute bony deformity. Review of the MIP images confirms the above findings. CTA ABDOMEN AND PELVIS FINDINGS VASCULAR Aorta: There are diffuse atherosclerotic calcifications involving the abdominal aorta without evidence of dissection. The aorta  is nonaneurysmal. It is markedly tortuous. Celiac: Patent.  Branch vessels patent. SMA: Atherosclerotic calcifications at the origin without significant focal narrowing. Branch vessels are grossly patent. Renals: 2 right renal arteries and a single left renal artery are patent. IMA: Diminutive and grossly patent. Inflow: There are atherosclerotic calcifications and some smooth plaque involving the bilateral  common iliac arteries without significant focal narrowing. Internal and external iliac arteries are patent with minimal disease. Review of the MIP images confirms the above findings. NON-VASCULAR Hepatobiliary: Diffuse hepatic steatosis. Low-density lesions within the liver are grossly unchanged. Postcholecystectomy. Pancreas: Unremarkable Spleen: Unremarkable Adrenals/Urinary Tract: Adrenal glands are within normal limits. Tiny cyst in the lower pole of the right kidney. Left kidney is unremarkable. Bladder is within normal limits. Stomach/Bowel: There is high-density material layering in the cecum. There are also high density objects in the ascending colon on image 156 and within bowel in the central abdomen on image 157 of series 6. These areas most likely simply represent hyperdense bowel contents. Extravasation of contrast to suggest gastrointestinal bleeding cannot be excluded. This should be correlated with patient's history. There is no evidence of small-bowel obstruction. There is no obvious mass in the colon. Diverticulosis of the sigmoid colon. Appendix is not inflamed. Lymphatic: No abnormal retroperitoneal adenopathy. Reproductive: Uterus is absent.  Adnexa are within normal limits. Other: No free fluid. Musculoskeletal: Levoscoliosis in the upper lumbar spine. Advanced degenerative disc disease in the lower lumbar spine. No vertebral compression deformity. 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 Review of the MIP images confirms the above findings. IMPRESSION: Vascular: There is no evidence of aortic dissection, aneurysm, or intramural hematoma. No acute vascular pathology in the abdomen. Atherosclerotic changes are noted. Nonvascular: Bilateral lower lobe airspace and ground-glass opacities left greater than right have improved supporting an inflammatory process. Stable left apical lung mass. There is high-density material within bowel as described. This most likely represents  high density bowel contents. If the patient has a history of lower gastrointestinal bleeding, active contrast extravasation from bleeding cannot be excluded. Correlate clinically. Electronically Signed   By: Marybelle Killings M.D.   On: 09/20/2017 09:41    Procedures Procedures (including critical care time)  Angiocath insertion Performed by: Wandra Arthurs  Consent: Verbal consent obtained. Risks and benefits: risks, benefits and alternatives were discussed Time out: Immediately prior to procedure a "time out" was called to verify the correct patient, procedure, equipment, support staff and site/side marked as required.  Preparation: Patient was prepped and draped in the usual sterile fashion.  Vein Location: L basilic  Ultrasound Guided  Gauge: 20 long   Normal blood return and flush without difficulty Patient tolerance: Patient tolerated the procedure well with no immediate complications.     Medications Ordered in ED Medications  vancomycin (VANCOCIN) IVPB 1000 mg/200 mL premix (not administered)  ceFEPIme (MAXIPIME) 1 g in sodium chloride 0.9 % 100 mL IVPB (1 g Intravenous New Bag/Given 09/20/17 1059)  ceFEPIme (MAXIPIME) 1 g injection (not administered)  albuterol (PROVENTIL) (2.5 MG/3ML) 0.083% nebulizer solution 5 mg (not administered)  hydrALAZINE (APRESOLINE) injection 10 mg (10 mg Intravenous Given 09/20/17 0813)  albuterol (PROVENTIL) (2.5 MG/3ML) 0.083% nebulizer solution 5 mg (5 mg Nebulization Given 09/20/17 0821)  ipratropium (ATROVENT) nebulizer solution 0.5 mg (0.5 mg Nebulization Given 09/20/17 0821)  iopamidol (ISOVUE-370) 76 % injection 100 mL (100 mLs Intravenous Contrast Given 09/20/17 0837)  ondansetron (ZOFRAN) injection 4 mg (4 mg Intravenous Given 09/20/17 0903)  morphine 4 MG/ML  injection 4 mg (4 mg Intravenous Given 09/20/17 0914)  methylPREDNISolone sodium succinate (SOLU-MEDROL) 125 mg/2 mL injection 125 mg (125 mg Intravenous Given 09/20/17 1058)     Initial  Impression / Assessment and Plan / ED Course  I have reviewed the triage vital signs and the nursing notes.  Pertinent labs & imaging results that were available during my care of the patient were reviewed by me and considered in my medical decision making (see chart for details).     Serinity Ware is a 77 y.o. female here with SOB, chest pain, back pain. Hypertensive to around 200 in the ED. She had radiation yesterday. Consider radiation pneumonitis vs dissection vs PE vs COPD exacerbation. Will get labs, CT dissection study. Will give nebs, hydralazine and reassess.   11:04 AM VBG reassuring. Still wheezing after nebs. Given steroids. CTA showed no dissection, but there is pneumonia. WBC nl, lactate nl. Given vanc/cefepime for HCAP. occ negative. BP down to 120s now after hydralazine. Will admit for hypertensive urgency, COPD exacerbation, HCAP.     Final Clinical Impressions(s) / ED Diagnoses   Final diagnoses:  None    ED Discharge Orders    None       Drenda Freeze, MD 09/20/17 [phone]

## 2017-09-20 NOTE — H&P (Signed)
History and Physical    Tamara Adams HKV:[id number] DOB: 1940/10/10 DOA: 09/20/2017  PCP: [name], MD  Patient coming from: Home  Chief Complaint: Chest pain  HPI: Tamara Adams is a 77 y.o. female with medical history significant of lung cancer, advanced COPD on 8 L of oxygen at baseline at home, hypertension comes in with chest pain that radiated to her back since last night.  Patient's been getting radiation and thought this was due to her radiation.  She denies any fevers.  She denies any changes in her chronic cough.  She denies any hemoptysis.  There is been no nausea vomiting or diarrhea.  There is been no flulike symptoms.  Patient is being referred for admission for her chest pain along with possibility of pneumonia.  Review of Systems: As per HPI otherwise 10 point review of systems negative.   Past Medical History:  Diagnosis Date  . Anemia    as a child  . Arthritis    "hands" (12/24/2014)  . Complication of anesthesia    " My blood gas dropped during surgery so I was left on a ventilator and in ICU for 3 days."  . COPD (chronic obstructive pulmonary disease) (HCC)    FVC 72%, FEV1 29%, FEV1 ratio 32% (very severe (COPD)  . COPD (chronic obstructive pulmonary disease) ([place])   . Degenerative disc disease   . Depression with anxiety   . Diverticulosis   . DVT (deep venous thrombosis) ([place])    "LLE; years ago after a surgery"  . Essential hypertension   . Fluttering sensation of heart    pt put on metoprolol as a result  . GERD (gastroesophageal reflux disease)    PMH  . H/O hiatal hernia   . Headache    "maybe weekly" (12/24/2014)  . Hyperlipidemia   . Hypertension   . Kidney stone   . Liver lesion   . MI (myocardial infarction) ([place]) 07/19/2017  . On home oxygen therapy    "3L; sleep w/it; use it when I rest" (12/24/2014)  . Peptic ulcer   . Pinched nerve    in back  . Pneumonia   . PONV (postoperative nausea and vomiting)   . Shortness of breath  dyspnea    with exertion  . Skipped heart beats   . Small bowel obstruction ([place])    "several times; OR twice" (12/24/2014)  . Tubular adenoma of colon 09/2011  . Wears glasses     Past Surgical History:  Procedure Laterality Date  . ABDOMINAL ADHESION SURGERY  12/24/2014  . ABDOMINAL HYSTERECTOMY  1987   and one ovary  . CATARACT EXTRACTION W/ INTRAOCULAR LENS  IMPLANT, BILATERAL Right 06/22/2012   Dr. Lester Kinsman.   Marland Kitchen CATARACT EXTRACTION W/PHACO Left 06/12/2012   Dr. Boykin Reaper  . CHOLECYSTECTOMY N/A 08/18/2013   Procedure: LAPAROSCOPIC CHOLECYSTECTOMY;  Surgeon: Harl Bowie, MD;  Location: [facility];  Service: General;  Laterality: N/A;  . DILATION AND CURETTAGE OF UTERUS    . EXPLORATORY LAPAROTOMY  12/24/2014  . HERNIA REPAIR    . INCISIONAL HERNIA REPAIR  12/24/2014  . INCISIONAL HERNIA REPAIR N/A 12/24/2014   Procedure: HERNIA REPAIR INCISIONAL;  Surgeon: Coralie Keens, MD;  Location: [facility];  Service: General;  Laterality: N/A;  . LAPAROTOMY N/A 12/24/2014   Procedure: EXPLORATORY LAPAROTOMY;  Surgeon: Coralie Keens, MD;  Location: [facility];  Service: General;  Laterality: N/A;  . LYSIS OF ADHESION N/A 12/24/2014   Procedure: LYSIS OF ADHESION;  Surgeon: Coralie Keens, MD;  Location: [facility];  Service: General;  Laterality: N/A;  . MULTIPLE TOOTH EXTRACTIONS    . OOPHORECTOMY  1992  . SMALL INTESTINE SURGERY     for a blockage, no bowel was removed, just kinked from scar tissue  . TUBAL LIGATION    . URETHRAL DILATION       reports that she quit smoking about 2 years ago. Her smoking use included cigarettes. She has a 14.25 pack-year smoking history. she has never used smokeless tobacco. She reports that she does not drink alcohol or use drugs.  Allergies  Allergen Reactions  . Maxidex [Dexamethasone] Other (See Comments)    DEHYDRATION AND HEART RACING  . Advair Diskus [Fluticasone-Salmeterol] Other (See Comments)    No benefit with lungs (INEFFECTIVE)  . Remeron  [Mirtazapine] Other (See Comments)    CAUSED NIGHTMARES  . Trazodone And Nefazodone Other (See Comments)    Heart pounding  . Tylox [Oxycodone-Acetaminophen] Itching    Family History  Problem Relation Age of Onset  . Hypertension Mother   . Ovarian cancer Mother   . Glaucoma Mother   . Glaucoma Sister   . Pancreatic cancer Maternal Uncle   . Colon cancer Neg Hx   . Colon polyps Neg Hx   . Diabetes Neg Hx   . Kidney disease Neg Hx   . Gallbladder disease Neg Hx   . Esophageal cancer Neg Hx     Prior to Admission medications   Medication Sig Start Date End Date Taking? Authorizing Provider  acetaminophen (TYLENOL) 325 MG tablet Take 325-650 mg by mouth every 6 (six) hours as needed for headache.    [provider]  albuterol (PROVENTIL) (2.5 MG/3ML) 0.083% nebulizer solution USE 1 VIAL (3 ML) VIA NEBULIZER EVERY 6 HOURS AS NEEDED FOR WHEEZING OR SHORTNESS OF BREATH 02/11/17   [name], MD  AMBULATORY NON FORMULARY MEDICATION Medication Name: Needs oxygen concentrator set to 6 liter per minutes as she has a long tubing on her machine.  Fax to [name] 08/27/16   [name], MD  AMBULATORY NON FORMULARY MEDICATION Medication Name: GI cocktail: 137mL Lidocaine2%, 159ml Maalox, 36ml Donnatol. Ok to take twice daily. 08/14/17   [name], MD  clopidogrel (PLAVIX) 75 MG tablet Take 1 tablet by mouth daily 08/05/17   [name], MD  dronabinol (MARINOL) 2.5 MG capsule Take 1 capsule (2.5 mg total) by mouth 2 (two) times daily. 07/25/17   [name], MD  guaiFENesin (MUCINEX) 600 MG 12 hr tablet Take 1 tablet (600 mg total) by mouth 2 (two) times daily. 04/03/16   Barton Dubois, MD  HYDROcodone-acetaminophen [facility]) 10-325 MG tablet Take 1 tablet by mouth every 6 (six) hours as needed. 09/16/17   [name], MD  INCRUSE ELLIPTA 62.5 MCG/INH AEPB USE 1 INHALATION DAILY 07/25/17   [name], MD  lidocaine (XYLOCAINE) 2  % solution Use as directed 20 mLs in the mouth or throat 2 (two) times daily as needed. Patient taking differently: Use as directed 20 mLs in the mouth or throat 2 (two) times daily as needed for mouth pain.  04/17/17   [name], MD  losartan (COZAAR) 50 MG tablet Take 1 tablet (50 mg total) by mouth daily. 08/07/17 11/05/17  Nahser, Wonda Cheng, MD  metoprolol tartrate (LOPRESSOR) 50 MG tablet Take 1 tablet (50 mg total) by mouth 2 (two) times daily. 08/05/17   [name], MD  Multiple  Vitamin (MULTIVITAMIN WITH MINERALS) TABS tablet Take 1 tablet by mouth daily. Pt uses One-A-Day brand    [provider]  omeprazole (PRILOSEC) 40 MG capsule Take 1 capsule (40 mg total) by mouth 2 (two) times daily. 04/03/16   Barton Dubois, MD  ondansetron (ZOFRAN-ODT) 8 MG disintegrating tablet PLACE 1 TABLET ON THE TONGUE EVERY 8 HOURS AS NEEDED FOR NAUSEA 08/09/17   [name], MD  polyethylene glycol (MIRALAX / GLYCOLAX) packet Take 17 g by mouth daily. 07/28/17   Rosita Fire, MD  promethazine (PHENERGAN) 25 MG tablet Take 1 tablet (25 mg total) by mouth every 8 (eight) hours as needed for nausea or vomiting. 09/02/17   [name], MD  ranitidine (ZANTAC) 150 MG tablet Take 1 tablet (150 mg total) by mouth daily. 01/03/17   [name], MD  traMADol (ULTRAM) 50 MG tablet Take 1 tablet (50 mg total) by mouth every 6 (six) hours as needed (pain). 01/14/17   [name], MD    Physical Exam: Vitals:   09/20/17 1100 09/20/17 1117 09/20/17 1130 09/20/17 1200  BP: (!) 154/69  (!) 159/58 (!) 151/62  Pulse: 86  95 100  Resp: (!) 22  (!) 21 19  Temp:      TempSrc:      SpO2: 92% 98% 98% 97%  Weight:      Height:          Constitutional: NAD, calm, comfortable cachectic Vitals:   09/20/17 1100 09/20/17 1117 09/20/17 1130 09/20/17 1200  BP: (!) 154/69  (!) 159/58 (!) 151/62  Pulse: 86  95 100  Resp: (!) 22  (!) 21 19  Temp:        TempSrc:      SpO2: 92% 98% 98% 97%  Weight:      Height:       Eyes: PERRL, lids and conjunctivae normal ENMT: Mucous membranes are moist. Posterior pharynx clear of any exudate or lesions.Normal dentition.  Neck: normal, supple, no masses, no thyromegaly Respiratory: clear to auscultation bilaterally, no wheezing, no crackles. Normal respiratory effort. No accessory muscle use.  Cardiovascular: Regular rate and rhythm, no murmurs / rubs / gallops. No extremity edema. 2+ pedal pulses. No carotid bruits.  Abdomen: no tenderness, no masses palpated. No hepatosplenomegaly. Bowel sounds positive.  Musculoskeletal: no clubbing / cyanosis. No joint deformity upper and lower extremities. Good ROM, no contractures. Normal muscle tone.  Skin: no rashes, lesions, ulcers. No induration Neurologic: CN 2-12 grossly intact. Sensation intact, DTR normal. Strength 5/5 in all 4.  Psychiatric: Normal judgment and insight. Alert and oriented x 3. Normal mood.    Labs on Admission: I have personally reviewed following labs and imaging studies  CBC: Recent Labs  Lab 09/18/17 1324 09/20/17 0758  WBC 5.3 6.1  NEUTROABS 2.9 4.4  HGB  --  10.8*  HCT 32.4* 33.6*  MCV 91.0 87.7  PLT 344 474   Basic Metabolic Panel: Recent Labs  Lab 09/18/17 1324 09/20/17 0758  NA 138 131*  K 5.6* 4.1  CL 99 92*  CO2 30* 29  GLUCOSE 124 121*  BUN 7 6  CREATININE 0.78 0.68  CALCIUM 9.2 8.9   GFR: Estimated Creatinine Clearance: 38.5 mL/min (by C-G formula based on SCr of 0.68 mg/dL). Liver Function Tests: Recent Labs  Lab 09/18/17 1324 09/20/17 0758  AST 24 26  ALT 18 15  ALKPHOS 78 74  BILITOT 0.2 0.2*  PROT 6.5 6.9  ALBUMIN 3.4*  3.7   Recent Labs  Lab 09/20/17 0758  LIPASE 28   No results for input(s): AMMONIA in the last 168 hours. Coagulation Profile: No results for input(s): INR, PROTIME in the last 168 hours. Cardiac Enzymes: Recent Labs  Lab 09/20/17 0758  TROPONINI <0.03   BNP  (last 3 results) No results for input(s): PROBNP in the last 8760 hours. HbA1C: No results for input(s): HGBA1C in the last 72 hours. CBG: No results for input(s): GLUCAP in the last 168 hours. Lipid Profile: No results for input(s): CHOL, HDL, LDLCALC, TRIG, CHOLHDL, LDLDIRECT in the last 72 hours. Thyroid Function Tests: No results for input(s): TSH, T4TOTAL, FREET4, T3FREE, THYROIDAB in the last 72 hours. Anemia Panel: Recent Labs    09/18/17 1325  FERRITIN 893*  TIBC 231*  IRON 186*   Urine analysis:    Component Value Date/Time   COLORURINE YELLOW 03/30/2016 1635   APPEARANCEUR CLOUDY (A) 03/30/2016 1635   LABSPEC 1.019 03/30/2016 1635   PHURINE 7.0 03/30/2016 1635   GLUCOSEU NEGATIVE 03/30/2016 1635   HGBUR TRACE (A) 03/30/2016 1635   BILIRUBINUR neg 05/04/2016 1525   KETONESUR 15 (A) 03/30/2016 1635   PROTEINUR 30 05/04/2016 1525   PROTEINUR 100 (A) 03/30/2016 1635   UROBILINOGEN 0.2 05/04/2016 1525   UROBILINOGEN 0.2 03/06/2015 2133   NITRITE neg 05/04/2016 1525   NITRITE NEGATIVE 03/30/2016 1635   LEUKOCYTESUR Negative 05/04/2016 1525   Sepsis Labs: !!!!!!!!!!!!!!!!!!!!!!!!!!!!!!!!!!!!!!!!!!!! @LABRCNTIP (procalcitonin:4,lacticidven:4) )No results found for this or any previous visit (from the past 240 hour(s)).   Radiological Exams on Admission: Ct Angio Chest/abd/pel For Dissection W And/or Wo Contrast  Result Date: 09/20/2017 CLINICAL DATA:  Short of breath yesterday.  Left-sided chest pain. EXAM: CT ANGIOGRAPHY CHEST, ABDOMEN AND PELVIS TECHNIQUE: Multidetector CT imaging through the chest, abdomen and pelvis was performed using the standard protocol during bolus administration of intravenous contrast. Multiplanar reconstructed images and MIPs were obtained and reviewed to evaluate the vascular anatomy. CONTRAST:  165mL ISOVUE-370 IOPAMIDOL (ISOVUE-370) INJECTION 76% COMPARISON:  08/19/2017 and 07/18/2017 FINDINGS: CTA CHEST FINDINGS Cardiovascular: There are  atherosclerotic calcifications involving the aortic arch and descending thoracic aorta. There is some irregular soft plaque in the descending thoracic aorta. There is no evidence of aortic dissection or intramural hematoma. Maximal diameter of the ascending aorta is 3.0 cm. Atherosclerotic calcifications of the great vessels are present without significant focal narrowing. Vertebral arteries are also patent within the confines of the examination. Minimal coronary artery calcifications. No obvious acute pulmonary thromboembolism. Mediastinum/Nodes: No abnormal mediastinal adenopathy by measurement criteria. Thyroid remains heterogeneous. Esophagus is nondilated. No obvious esophageal mass. Small hiatal hernia is suspected. Lungs/Pleura: Stable left apical lung mass. Severe emphysema is stable. Patchy ground-glass and airspace opacities in both lower lobes left greater than right have improved compared to the prior PET-CT. Musculoskeletal: No acute rib fracture.  No acute bony deformity. Review of the MIP images confirms the above findings. CTA ABDOMEN AND PELVIS FINDINGS VASCULAR Aorta: There are diffuse atherosclerotic calcifications involving the abdominal aorta without evidence of dissection. The aorta is nonaneurysmal. It is markedly tortuous. Celiac: Patent.  Branch vessels patent. SMA: Atherosclerotic calcifications at the origin without significant focal narrowing. Branch vessels are grossly patent. Renals: 2 right renal arteries and a single left renal artery are patent. IMA: Diminutive and grossly patent. Inflow: There are atherosclerotic calcifications and some smooth plaque involving the bilateral common iliac arteries without significant focal narrowing. Internal and external iliac arteries are patent with minimal disease. Review of the  MIP images confirms the above findings. NON-VASCULAR Hepatobiliary: Diffuse hepatic steatosis. Low-density lesions within the liver are grossly unchanged.  Postcholecystectomy. Pancreas: Unremarkable Spleen: Unremarkable Adrenals/Urinary Tract: Adrenal glands are within normal limits. Tiny cyst in the lower pole of the right kidney. Left kidney is unremarkable. Bladder is within normal limits. Stomach/Bowel: There is high-density material layering in the cecum. There are also high density objects in the ascending colon on image 156 and within bowel in the central abdomen on image 157 of series 6. These areas most likely simply represent hyperdense bowel contents. Extravasation of contrast to suggest gastrointestinal bleeding cannot be excluded. This should be correlated with patient's history. There is no evidence of small-bowel obstruction. There is no obvious mass in the colon. Diverticulosis of the sigmoid colon. Appendix is not inflamed. Lymphatic: No abnormal retroperitoneal adenopathy. Reproductive: Uterus is absent.  Adnexa are within normal limits. Other: No free fluid. Musculoskeletal: Levoscoliosis in the upper lumbar spine. Advanced degenerative disc disease in the lower lumbar spine. No vertebral compression deformity. 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 Review of the MIP images confirms the above findings. IMPRESSION: Vascular: There is no evidence of aortic dissection, aneurysm, or intramural hematoma. No acute vascular pathology in the abdomen. Atherosclerotic changes are noted. Nonvascular: Bilateral lower lobe airspace and ground-glass opacities left greater than right have improved supporting an inflammatory process. Stable left apical lung mass. There is high-density material within bowel as described. This most likely represents high density bowel contents. If the patient has a history of lower gastrointestinal bleeding, active contrast extravasation from bleeding cannot be excluded. Correlate clinically. Electronically Signed   By: Marybelle Killings M.D.   On: 09/20/2017 09:41    EKG: Independently reviewed.  Normal sinus  rhythm no acute changes Old chart reviewed Case discussed with the ED P Dr. Darl Householder  Assessment/Plan 77 year old female with atypical chest pain, bilateral pneumonia and COPD exacerbation  Principal Problem:   PNA (pneumonia)-bilateral infiltrates on chest x-ray.  IV vancomycin and cefepime.  Obtain blood and sputum cultures.  Active Problems:   COPD with acute exacerbation (HCC)-IV Solu-Medrol and frequent nebs continue her 8 L of oxygen.    Chest pain-serial troponin.  Patient just had an echo last month we will not repeat at this time unless troponin rises.  Currently pain-free.     Chronic hypoxemic respiratory failure (HCC)-continue 8 L of oxygen supplementation    Essential hypertension-resume home meds    Chronic pain syndrome-continue home meds    Paroxysmal atrial fibrillation (HCC)-currently rate controlled    DVT prophylaxis: SCDs Code Status: Full Family Communication: None Disposition Plan: Per day team Consults called: None Admission status: Observation   Nayana Lenig A MD [facility]  If 7PM-7AM, please contact night-coverage [email or web address] Password [email or web address]  09/20/2017, 1:17 PM

## 2017-09-20 NOTE — ED Notes (Signed)
Pt on auto VS  

## 2017-09-20 NOTE — Plan of Care (Signed)
  Progressing Education: Knowledge of General Education information will improve 09/20/2017 2238 - Progressing by Talbert Forest, RN Health Behavior/Discharge Planning: Ability to manage health-related needs will improve 09/20/2017 2238 - Progressing by Talbert Forest, RN Elimination: Will not experience complications related to urinary retention 09/20/2017 2238 - Progressing by Talbert Forest, RN Pain Managment: General experience of comfort will improve 09/20/2017 2238 - Progressing by Talbert Forest, RN Safety: Ability to remain free from injury will improve 09/20/2017 2238 - Progressing by Talbert Forest, RN

## 2017-09-20 NOTE — Progress Notes (Signed)
Pharmacy Antibiotic Note  Tamara Adams is a 77 y.o. female admitted on 09/20/2017 with pneumonia.  Pharmacy has been consulted for vancomycin dosing.  Received 1g in ED ~12 noon.  Today, 09/20/2017 Afebrile WBC normal SCr 0.68, CrCl ~38 ml/min Lactate normal  Plan: Continue with vancomycin 750mg  IV q48h Check levels at steady state if continues, goal AUC 400-500 Adjust cefepime to 1g q12h Follow up renal function & cultures  Height: 5\' 4"  (162.6 cm) Weight: 90 lb (40.8 kg) IBW/kg (Calculated) : 54.7  Temp (24hrs), Avg:98.5 F (36.9 C), Min:98.3 F (36.8 C), Max:98.6 F (37 C)  Recent Labs  Lab 09/18/17 1324 09/20/17 0758 09/20/17 1018  WBC 5.3 6.1  --   CREATININE 0.78 0.68  --   LATICACIDVEN  --   --  1.07    Estimated Creatinine Clearance: 38.5 mL/min (by C-G formula based on SCr of 0.68 mg/dL).    Allergies  Allergen Reactions  . Maxidex [Dexamethasone] Other (See Comments)    DEHYDRATION AND HEART RACING  . Advair Diskus [Fluticasone-Salmeterol] Other (See Comments)    No benefit with lungs (INEFFECTIVE)  . Remeron [Mirtazapine] Other (See Comments)    CAUSED NIGHTMARES  . Trazodone And Nefazodone Other (See Comments)    Heart pounding  . Tylox [Oxycodone-Acetaminophen] Itching    Antimicrobials this admission:  3/1 Vanc >> 3/1 Cefepime >>  Dose adjustments this admission:    Microbiology results:  3/1 BCx: sent 3/1 Sputum: ordered   Thank you for allowing pharmacy to be a part of this patient's care.  Peggyann Juba, PharmD, BCPS Pager: [phone] 09/20/2017 1:32 PM

## 2017-09-20 NOTE — ED Notes (Signed)
ED Provider at bedside. 

## 2017-09-20 NOTE — ED Triage Notes (Signed)
Patient reports left sided chest pain which began last night.  States she had radiation on the left side of her chest yesterday and believes this is causing her pain.  Denies new onset of shortness of breath, dizziness.  Uses 8L oxygen via [place] at home.

## 2017-09-21 DIAGNOSIS — Z87442 Personal history of urinary calculi: Secondary | ICD-10-CM | POA: Diagnosis not present

## 2017-09-21 DIAGNOSIS — E785 Hyperlipidemia, unspecified: Secondary | ICD-10-CM | POA: Diagnosis present

## 2017-09-21 DIAGNOSIS — Z8041 Family history of malignant neoplasm of ovary: Secondary | ICD-10-CM | POA: Diagnosis not present

## 2017-09-21 DIAGNOSIS — Z9842 Cataract extraction status, left eye: Secondary | ICD-10-CM | POA: Diagnosis not present

## 2017-09-21 DIAGNOSIS — Z8711 Personal history of peptic ulcer disease: Secondary | ICD-10-CM | POA: Diagnosis not present

## 2017-09-21 DIAGNOSIS — J189 Pneumonia, unspecified organism: Principal | ICD-10-CM

## 2017-09-21 DIAGNOSIS — Z9841 Cataract extraction status, right eye: Secondary | ICD-10-CM | POA: Diagnosis not present

## 2017-09-21 DIAGNOSIS — Z90710 Acquired absence of both cervix and uterus: Secondary | ICD-10-CM | POA: Diagnosis not present

## 2017-09-21 DIAGNOSIS — Z800 Family history of malignant neoplasm of digestive organs: Secondary | ICD-10-CM | POA: Diagnosis not present

## 2017-09-21 DIAGNOSIS — Y95 Nosocomial condition: Secondary | ICD-10-CM | POA: Diagnosis present

## 2017-09-21 DIAGNOSIS — L89152 Pressure ulcer of sacral region, stage 2: Secondary | ICD-10-CM | POA: Diagnosis present

## 2017-09-21 DIAGNOSIS — Z83511 Family history of glaucoma: Secondary | ICD-10-CM | POA: Diagnosis not present

## 2017-09-21 DIAGNOSIS — J440 Chronic obstructive pulmonary disease with acute lower respiratory infection: Secondary | ICD-10-CM | POA: Diagnosis present

## 2017-09-21 DIAGNOSIS — G894 Chronic pain syndrome: Secondary | ICD-10-CM | POA: Diagnosis present

## 2017-09-21 DIAGNOSIS — K219 Gastro-esophageal reflux disease without esophagitis: Secondary | ICD-10-CM | POA: Diagnosis present

## 2017-09-21 DIAGNOSIS — L8990 Pressure ulcer of unspecified site, unspecified stage: Secondary | ICD-10-CM | POA: Diagnosis present

## 2017-09-21 DIAGNOSIS — J9621 Acute and chronic respiratory failure with hypoxia: Secondary | ICD-10-CM | POA: Diagnosis present

## 2017-09-21 DIAGNOSIS — Z8249 Family history of ischemic heart disease and other diseases of the circulatory system: Secondary | ICD-10-CM | POA: Diagnosis not present

## 2017-09-21 DIAGNOSIS — J441 Chronic obstructive pulmonary disease with (acute) exacerbation: Secondary | ICD-10-CM | POA: Diagnosis present

## 2017-09-21 DIAGNOSIS — I480 Paroxysmal atrial fibrillation: Secondary | ICD-10-CM | POA: Diagnosis present

## 2017-09-21 DIAGNOSIS — L89151 Pressure ulcer of sacral region, stage 1: Secondary | ICD-10-CM | POA: Diagnosis present

## 2017-09-21 DIAGNOSIS — Z961 Presence of intraocular lens: Secondary | ICD-10-CM | POA: Diagnosis present

## 2017-09-21 DIAGNOSIS — I252 Old myocardial infarction: Secondary | ICD-10-CM | POA: Diagnosis not present

## 2017-09-21 DIAGNOSIS — Z85118 Personal history of other malignant neoplasm of bronchus and lung: Secondary | ICD-10-CM | POA: Diagnosis not present

## 2017-09-21 DIAGNOSIS — I10 Essential (primary) hypertension: Secondary | ICD-10-CM | POA: Diagnosis present

## 2017-09-21 LAB — BASIC METABOLIC PANEL
ANION GAP: 8 (ref 5–15)
BUN: 12 mg/dL (ref 6–20)
CHLORIDE: 95 mmol/L — AB (ref 101–111)
CO2: 29 mmol/L (ref 22–32)
Calcium: 9.3 mg/dL (ref 8.9–10.3)
Creatinine, Ser: 0.87 mg/dL (ref 0.44–1.00)
Glucose, Bld: 146 mg/dL — ABNORMAL HIGH (ref 65–99)
Potassium: 4.7 mmol/L (ref 3.5–5.1)
SODIUM: 132 mmol/L — AB (ref 135–145)

## 2017-09-21 LAB — CBC WITH DIFFERENTIAL/PLATELET
BASOS ABS: 0 10*3/uL (ref 0.0–0.1)
Basophils Relative: 0 %
Eosinophils Absolute: 0 10*3/uL (ref 0.0–0.7)
Eosinophils Relative: 0 %
HCT: 30.5 % — ABNORMAL LOW (ref 36.0–46.0)
HEMOGLOBIN: 9.8 g/dL — AB (ref 12.0–15.0)
LYMPHS ABS: 0.4 10*3/uL — AB (ref 0.7–4.0)
LYMPHS PCT: 8 %
MCH: 28.6 pg (ref 26.0–34.0)
MCHC: 32.1 g/dL (ref 30.0–36.0)
MCV: 88.9 fL (ref 78.0–100.0)
Monocytes Absolute: 0.1 10*3/uL (ref 0.1–1.0)
Monocytes Relative: 2 %
NEUTROS PCT: 90 %
Neutro Abs: 4.7 10*3/uL (ref 1.7–7.7)
PLATELETS: 309 10*3/uL (ref 150–400)
RBC: 3.43 MIL/uL — AB (ref 3.87–5.11)
RDW: 17.4 % — ABNORMAL HIGH (ref 11.5–15.5)
WBC: 5.2 10*3/uL (ref 4.0–10.5)

## 2017-09-21 LAB — TROPONIN I

## 2017-09-21 LAB — STREP PNEUMONIAE URINARY ANTIGEN: Strep Pneumo Urinary Antigen: NEGATIVE

## 2017-09-21 MED ORDER — UMECLIDINIUM BROMIDE 62.5 MCG/INH IN AEPB
1.0000 | INHALATION_SPRAY | Freq: Every day | RESPIRATORY_TRACT | Status: DC
  Administered 2017-09-21 – 2017-09-22 (×2): 1 via RESPIRATORY_TRACT
  Filled 2017-09-21: qty 7

## 2017-09-21 MED ORDER — ACETAMINOPHEN 325 MG PO TABS: 325.0000 mg | ORAL_TABLET | Freq: Four times a day (QID) | ORAL | Status: DC | PRN

## 2017-09-21 MED ORDER — PROMETHAZINE HCL 25 MG PO TABS
25.0000 mg | ORAL_TABLET | Freq: Four times a day (QID) | ORAL | Status: DC | PRN
  Administered 2017-09-21 – 2017-09-22 (×2): 25 mg via ORAL
  Filled 2017-09-21 (×2): qty 1

## 2017-09-21 MED ORDER — ONDANSETRON HCL 4 MG/2ML IJ SOLN
4.0000 mg | Freq: Four times a day (QID) | INTRAMUSCULAR | Status: DC | PRN
  Administered 2017-09-21 – 2017-09-22 (×3): 4 mg via INTRAVENOUS
  Filled 2017-09-21 (×3): qty 2

## 2017-09-21 MED ORDER — BISACODYL 10 MG RE SUPP: 10.0000 mg | Freq: Every day | RECTAL | Status: DC | PRN

## 2017-09-21 MED ORDER — GI COCKTAIL ~~LOC~~: 30.0000 mL | Freq: Two times a day (BID) | ORAL | Status: DC | PRN

## 2017-09-21 MED ORDER — FLUTICASONE FUROATE-VILANTEROL 100-25 MCG/INH IN AEPB
1.0000 | INHALATION_SPRAY | Freq: Every day | RESPIRATORY_TRACT | Status: DC
  Administered 2017-09-21 – 2017-09-22 (×2): 1 via RESPIRATORY_TRACT
  Filled 2017-09-21: qty 28

## 2017-09-21 MED ORDER — SENNOSIDES-DOCUSATE SODIUM 8.6-50 MG PO TABS
1.0000 | ORAL_TABLET | Freq: Two times a day (BID) | ORAL | Status: DC
  Administered 2017-09-21 – 2017-09-22 (×3): 1 via ORAL
  Filled 2017-09-21 (×3): qty 1

## 2017-09-21 MED ORDER — HYDROCODONE-ACETAMINOPHEN 10-325 MG PO TABS
1.0000 | ORAL_TABLET | Freq: Four times a day (QID) | ORAL | Status: DC | PRN
  Administered 2017-09-21 – 2017-09-22 (×4): 1 via ORAL
  Filled 2017-09-21 (×4): qty 1

## 2017-09-21 MED ORDER — DRONABINOL 2.5 MG PO CAPS
2.5000 mg | ORAL_CAPSULE | Freq: Two times a day (BID) | ORAL | Status: DC
  Administered 2017-09-21 – 2017-09-22 (×3): 2.5 mg via ORAL
  Filled 2017-09-21 (×3): qty 1

## 2017-09-21 MED ORDER — CLOPIDOGREL BISULFATE 75 MG PO TABS
75.0000 mg | ORAL_TABLET | Freq: Every day | ORAL | Status: DC
  Administered 2017-09-21 – 2017-09-22 (×2): 75 mg via ORAL
  Filled 2017-09-21 (×2): qty 1

## 2017-09-21 MED ORDER — FAMOTIDINE 20 MG PO TABS
20.0000 mg | ORAL_TABLET | Freq: Every day | ORAL | Status: DC
  Administered 2017-09-21 – 2017-09-22 (×2): 20 mg via ORAL
  Filled 2017-09-21 (×2): qty 1

## 2017-09-21 NOTE — Progress Notes (Signed)
[facility] Progress Note  Patient: Tamara Adams WJX:[id number]   PCP: [name], MD DOB: [birth year]   DOA: 09/20/2017   DOS: 09/21/2017   Date of Service: the patient was seen and examined on 09/21/2017  Subjective: Chest pain is resolved.  No cough no fever no chills.  No breathing complaint which is new.  No abdominal pain or diarrhea  Brief hospital course: Pt. with PMH of lung cancer, COPD on 8 L of oxygen, HTN; admitted on 09/20/2017, presented with complaint of chest pain, was found to have acute on chronic hypoxic respiratory failure. Currently further plan is continue close monitoring.  Assessment and Plan: 1.  Acute on chronic hypoxic respiratory failure Presented with complaints of chest pain, initially suspected to have pneumonia.  CT angiogram was negative for any acute pneumonia and actually showed improved inflammation from prior PET scan. Continue clinical monitoring although discontinue vancomycin and cefepime. Currently back on home oxygen, patient was also started on IV salmeterol for suspicion for COPD exacerbation currently appears to be well managed as well. May have sustained some bronchospasm which currently has resolved. Monitor.  2.  Abnormal CT scan. CT scan shows high density object in the ascending colon. Patient denies any abdominal pain. Do not suspect patient actually has an active GI bleed. Appears to have significant stool content and therefore will provide stool softener and monitor H&H.  3.  Chronic hypoxic respiratory failure. Continue 8 L of oxygen.  4.  Essential hypertension. Continue home medication.  5.  Paroxysmal A. fib. Currently rate controlled.  Diet: cardiac diet DVT Prophylaxis: subcutaneous Heparin  Advance goals of care discussion: full code  Family Communication: no family was present at bedside, at the time of interview.  Disposition:  Discharge to home.  Consultants: none Procedures:  none  Antibiotics: Anti-infectives (From admission, onward)   Start     Dose/Rate Route Frequency Ordered Stop   09/22/17 1200  vancomycin (VANCOCIN) IVPB 750 mg/150 ml premix  Status:  Discontinued     750 mg 150 mL/hr over 60 Minutes Intravenous Every 48 hours 09/20/17 1330 09/21/17 1004   09/20/17 2200  ceFEPIme (MAXIPIME) 1 g in sodium chloride 0.9 % 100 mL IVPB  Status:  Discontinued     1 g 200 mL/hr over 30 Minutes Intravenous Every 12 hours 09/20/17 1315 09/21/17 1004   09/20/17 1059  ceFEPIme (MAXIPIME) 1 g injection    Comments:  Isom, Rebekah   : cabinet override      09/20/17 1059 09/20/17 2259   09/20/17 1000  vancomycin (VANCOCIN) IVPB 1000 mg/200 mL premix     1,000 mg 200 mL/hr over 60 Minutes Intravenous  Once 09/20/17 0945 09/20/17 1243   09/20/17 1000  ceFEPIme (MAXIPIME) 1 g in sodium chloride 0.9 % 100 mL IVPB     1 g 200 mL/hr over 30 Minutes Intravenous  Once 09/20/17 0945 09/20/17 1141       Objective: Physical Exam: Vitals:   09/20/17 2143 09/21/17 0525 09/21/17 0922 09/21/17 1325  BP:  (!) 167/69  (!) 162/52  Pulse:  74  90  Resp:  16  18  Temp:  98 F (36.7 C)  98.3 F (36.8 C)  TempSrc:  Oral  Oral  SpO2: 100% 100% 98% 100%  Weight:      Height:        Intake/Output Summary (Last 24 hours) at 09/21/2017 2001 Last data filed at 09/21/2017 0600 Gross per 24 hour  Intake 103 ml  Output 400 ml  Net -297 ml   Filed Weights   09/20/17 0749 09/20/17 1412  Weight: 40.8 kg (90 lb) 39.3 kg (86 lb 9.6 oz)   General: Alert, Awake and Oriented to Time, Place and Person. Appear in mild distress, affect appropriate Eyes: PERRL, Conjunctiva normal ENT: Oral Mucosa clear moist. Neck: no JVD, no Abnormal Mass Or lumps Cardiovascular: S1 and S2 Present, no Murmur, Peripheral Pulses Present Respiratory: normal respiratory effort, Bilateral Air entry equal and Decreased, no use of accessory muscle, basal Crackles, no wheezes Abdomen: Bowel Sound present,  Soft and no tenderness, no hernia Skin: no redness, no Rash, no induration Extremities: no Pedal edema, no calf tenderness Neurologic: Grossly no focal neuro deficit. Bilaterally Equal motor strength  Data Reviewed: CBC: Recent Labs  Lab 09/18/17 1324 09/20/17 0758 09/21/17 0113  WBC 5.3 6.1 5.2  NEUTROABS 2.9 4.4 4.7  HGB  --  10.8* 9.8*  HCT 32.4* 33.6* 30.5*  MCV 91.0 87.7 88.9  PLT 344 352 938   Basic Metabolic Panel: Recent Labs  Lab 09/18/17 1324 09/20/17 0758 09/21/17 0113  NA 138 131* 132*  K 5.6* 4.1 4.7  CL 99 92* 95*  CO2 30* 29 29  GLUCOSE 124 121* 146*  BUN 7 6 12   CREATININE 0.78 0.68 0.87  CALCIUM 9.2 8.9 9.3    Liver Function Tests: Recent Labs  Lab 09/18/17 1324 09/20/17 0758  AST 24 26  ALT 18 15  ALKPHOS 78 74  BILITOT 0.2 0.2*  PROT 6.5 6.9  ALBUMIN 3.4* 3.7   Recent Labs  Lab 09/20/17 0758  LIPASE 28   No results for input(s): AMMONIA in the last 168 hours. Coagulation Profile: No results for input(s): INR, PROTIME in the last 168 hours. Cardiac Enzymes: Recent Labs  Lab 09/20/17 0758 09/20/17 1327 09/20/17 1851 09/21/17 0113  TROPONINI <0.03 <0.03 <0.03 <0.03   BNP (last 3 results) No results for input(s): PROBNP in the last 8760 hours. CBG: No results for input(s): GLUCAP in the last 168 hours. Studies: No results found.  Scheduled Meds: . clopidogrel  75 mg Oral Daily  . dronabinol  2.5 mg Oral BID  . famotidine  20 mg Oral Daily  . feeding supplement  1 Container Oral TID BM  . fluticasone furoate-vilanterol  1 puff Inhalation Daily  . guaiFENesin  600 mg Oral BID  . losartan  50 mg Oral Daily  . mouth rinse  15 mL Mouth Rinse BID  . metoprolol tartrate  50 mg Oral BID  . multivitamin with minerals  1 tablet Oral Daily  . pantoprazole  40 mg Oral Daily  . polyethylene glycol  17 g Oral Daily  . senna-docusate  1 tablet Oral BID  . sodium chloride flush  3 mL Intravenous Q12H  . umeclidinium bromide  1 puff  Inhalation Daily   Continuous Infusions: . sodium chloride     PRN Meds: sodium chloride, acetaminophen, bisacodyl, gi cocktail, HYDROcodone-acetaminophen, ondansetron (ZOFRAN) IV, promethazine, sodium chloride flush, traMADol  Time spent: 35 minutes  Author: Berle Mull, MD [facility] Pager: [phone] 09/21/2017 8:01 PM  If 7PM-7AM, please contact night-coverage at [email or web address], password [facility]

## 2017-09-22 LAB — CBC
HCT: 29 % — ABNORMAL LOW (ref 36.0–46.0)
HEMOGLOBIN: 9.2 g/dL — AB (ref 12.0–15.0)
MCH: 28.8 pg (ref 26.0–34.0)
MCHC: 31.7 g/dL (ref 30.0–36.0)
MCV: 90.6 fL (ref 78.0–100.0)
Platelets: 321 10*3/uL (ref 150–400)
RBC: 3.2 MIL/uL — ABNORMAL LOW (ref 3.87–5.11)
RDW: 18 % — AB (ref 11.5–15.5)
WBC: 6.8 10*3/uL (ref 4.0–10.5)

## 2017-09-22 LAB — BASIC METABOLIC PANEL
ANION GAP: 7 (ref 5–15)
BUN: 21 mg/dL — ABNORMAL HIGH (ref 6–20)
CALCIUM: 8.9 mg/dL (ref 8.9–10.3)
CO2: 31 mmol/L (ref 22–32)
CREATININE: 0.75 mg/dL (ref 0.44–1.00)
Chloride: 94 mmol/L — ABNORMAL LOW (ref 101–111)
GFR calc Af Amer: >60 mL/min (ref >60)
GFR calc non Af Amer: >60 mL/min (ref >60)
Glucose, Bld: 88 mg/dL (ref 65–99)
Potassium: 4.7 mmol/L (ref 3.5–5.1)
SODIUM: 132 mmol/L — AB (ref 135–145)

## 2017-09-22 LAB — MAGNESIUM: MAGNESIUM: 1.9 mg/dL (ref 1.7–2.4)

## 2017-09-22 LAB — RETICULOCYTES
RBC.: 3.2 MIL/uL — ABNORMAL LOW (ref 3.87–5.11)
Retic Count, Absolute: 64 10*3/uL (ref 19.0–186.0)
Retic Ct Pct: 2 % (ref 0.4–3.1)

## 2017-09-22 MED ORDER — BOOST BREEZE PO LIQD: 1.0000 | Freq: Three times a day (TID) | ORAL | 0 refills | Status: AC

## 2017-09-22 MED ORDER — DOCUSATE SODIUM 100 MG PO CAPS: 100.0000 mg | ORAL_CAPSULE | Freq: Every day | ORAL | 0 refills | Status: AC

## 2017-09-22 NOTE — Progress Notes (Signed)
Patient discharged home with family, discharge instructions given and explained to patient/family and they verbalized understanding, patient denies any pain/distress, pressures injury stage II patient was admitted with clean dry/intact, no sign of infection noted. Patient accompanied home by son, transported to the car by staff.

## 2017-09-22 NOTE — Progress Notes (Signed)
PT Cancellation Note  Patient Details Name: Tamara Adams MRN: [id number] DOB: [birth year]   Cancelled Treatment:    Reason Eval/Treat Not Completed: Patient declined, patient reports recently medicated for pain and nausea.. She also is hopeful for DC home today and wants to rest. Has all DME, WC and caregivers needed. If not DC'd today, will proceed with evaluation tomorrow.   Claretha Cooper 09/22/2017, 8:45 AM  Tresa Endo PT [phone]

## 2017-09-22 NOTE — Care Management Note (Addendum)
Case Management Note  Patient Details  Name: Tamara Adams MRN: [id number] Date of Birth: Feb 17, 1941  Subjective/Objective:    Acute on chronic respiratory failure, chest pain              Action/Plan: NCM spoke to pt and offered choice for [facility]. Pt states she has [facility] in the past. She has RW and oxygen ([employer]) at home. Will have husband bring portable for dc. Contacted [facility] with new referral.   Per [facility], pt active with HH, added OT and aide. They will resume HH.   Expected Discharge Date:  09/22/17               Expected Discharge Plan:  [place]  In-House Referral:  NA  Discharge planning Services  CM Consult  Post Acute Care Choice:  Home Health Choice offered to:  Patient  DME Arranged:  N/A DME Agency:  NA  HH Arranged:  RN, PT [place] Agency:  [place]  Status of Service:  Completed, signed off  If discussed at [place] of Stay Meetings, dates discussed:    Additional Comments:  Erenest Rasher, RN 09/22/2017, 11:22 AM

## 2017-09-23 ENCOUNTER — Ambulatory Visit
Admission: RE | Admit: 2017-09-23 | Discharge: 2017-09-23 | Disposition: A | Discharge status: Home / Self Care | Payer: Medicare Other | Source: Ambulatory Visit | Attending: Radiation Oncology | Admitting: Radiation Oncology

## 2017-09-23 DIAGNOSIS — E43 Unspecified severe protein-calorie malnutrition: Secondary | ICD-10-CM | POA: Diagnosis not present

## 2017-09-23 DIAGNOSIS — I481 Persistent atrial fibrillation: Secondary | ICD-10-CM | POA: Diagnosis not present

## 2017-09-23 DIAGNOSIS — R911 Solitary pulmonary nodule: Secondary | ICD-10-CM | POA: Insufficient documentation

## 2017-09-23 DIAGNOSIS — I214 Non-ST elevation (NSTEMI) myocardial infarction: Secondary | ICD-10-CM | POA: Diagnosis not present

## 2017-09-23 DIAGNOSIS — J449 Chronic obstructive pulmonary disease, unspecified: Secondary | ICD-10-CM | POA: Diagnosis not present

## 2017-09-23 DIAGNOSIS — Z510 Encounter for antineoplastic radiation therapy: Secondary | ICD-10-CM | POA: Insufficient documentation

## 2017-09-23 DIAGNOSIS — Z9981 Dependence on supplemental oxygen: Secondary | ICD-10-CM | POA: Diagnosis not present

## 2017-09-23 DIAGNOSIS — I10 Essential (primary) hypertension: Secondary | ICD-10-CM | POA: Diagnosis not present

## 2017-09-23 LAB — CULTURE, RESPIRATORY: CULTURE: NORMAL

## 2017-09-23 NOTE — Progress Notes (Signed)
  Radiation Oncology         [phone] ________________________________  Name: Tamara Adams MRN: [id number]  Date: 09/23/2017  DOB: [birth year]  Stereotactic Body Radiotherapy Treatment Procedure Note  NARRATIVE:  Tamara Adams was brought to the stereotactic radiation treatment machine and placed supine on the CT couch. The patient was set up for stereotactic body radiotherapy on the body fix pillow.  3D TREATMENT PLANNING AND DOSIMETRY:  The patient's radiation plan was reviewed and approved prior to starting treatment.  It showed 3-dimensional radiation distributions overlaid onto the planning CT.  The [facility] for the target structures as well as the organs at risk were reviewed. The documentation of this is filed in the radiation oncology EMR.  SIMULATION VERIFICATION:  The patient underwent CT imaging on the treatment unit.  These were carefully aligned to document that the ablative radiation dose would cover the target volume and maximally spare the nearby organs at risk according to the planned distribution.  SPECIAL TREATMENT PROCEDURE: Tamara Adams received high dose ablative stereotactic body radiotherapy to the planned target volume without unforeseen complications. Treatment was delivered uneventfully. The high doses associated with stereotactic body radiotherapy and the significant potential risks require careful treatment set up and patient monitoring constituting a special treatment procedure   STEREOTACTIC TREATMENT MANAGEMENT:  Following delivery, the patient was evaluated clinically. The patient tolerated treatment without significant acute effects, and was discharged to home in stable condition.    PLAN: Routine follow-up in one month.  ________________________________  Blair Promise, PhD, MD

## 2017-09-24 DIAGNOSIS — Z9981 Dependence on supplemental oxygen: Secondary | ICD-10-CM | POA: Diagnosis not present

## 2017-09-24 DIAGNOSIS — I214 Non-ST elevation (NSTEMI) myocardial infarction: Secondary | ICD-10-CM | POA: Diagnosis not present

## 2017-09-24 DIAGNOSIS — E43 Unspecified severe protein-calorie malnutrition: Secondary | ICD-10-CM | POA: Diagnosis not present

## 2017-09-24 DIAGNOSIS — I481 Persistent atrial fibrillation: Secondary | ICD-10-CM | POA: Diagnosis not present

## 2017-09-24 DIAGNOSIS — I10 Essential (primary) hypertension: Secondary | ICD-10-CM | POA: Diagnosis not present

## 2017-09-24 DIAGNOSIS — J449 Chronic obstructive pulmonary disease, unspecified: Secondary | ICD-10-CM | POA: Diagnosis not present

## 2017-09-24 NOTE — Discharge Summary (Signed)
[facility] Discharge Summary   Patient: Tamara Adams FXT:[id number]   PCP: [name], MD DOB: [birth year]   Date of admission: 09/20/2017   Date of discharge: 09/22/2017   Discharge Diagnoses:  Principal Problem:   PNA (pneumonia) Active Problems:   COPD (chronic obstructive pulmonary disease) (HCC)   Chronic hypoxemic respiratory failure (HCC)   HCAP (healthcare-associated pneumonia)   COPD exacerbation (HCC)   Essential hypertension   Chronic pain syndrome   Paroxysmal atrial fibrillation (HCC)   COPD with acute exacerbation (HCC)   Chest pain   Pressure injury of skin   Acute and chronic respiratory failure with hypoxia ([place])   Admitted From: home Disposition:  home  Recommendations for Outpatient Follow-up:  1. Please follow-up with PCP in 1 week.  Follow-up Information    [name], MD. Schedule an appointment as soon as possible for a visit in 1 week(s).   Specialty:  Family Medicine Contact information: [address] [place] [address] [phone]        [facility].   Specialty:  Home Health Services Why:  Home Health Physical Therapy and RN - agency will call to arrange initial appointment Contact information: [address] [place] [address] [phone]        [facility] Follow up.   Why:  please call [employer] to come out to check your oxygen concentrator  Contact information: [address] [place] [address] [phone]          Diet recommendation: Regular diet  Activity: The patient is advised to gradually reintroduce usual activities.  Discharge Condition: good  Code Status: Full code  History of present illness: As per the H and P dictated on admission, "Tamara Adams is a 77 y.o. female with medical history significant of lung cancer, advanced COPD on 8 L of oxygen at baseline at home, hypertension comes in with chest pain that  radiated to her back since last night.  Patient's been getting radiation and thought this was due to her radiation.  She denies any fevers.  She denies any changes in her chronic cough.  She denies any hemoptysis.  There is been no nausea vomiting or diarrhea.  There is been no flulike symptoms.  Patient is being referred for admission for her chest pain along with possibility of pneumonia."  Hospital Course:  Summary of her active problems in the hospital is as following. 1.  Acute on chronic hypoxic respiratory failure Presented with complaints of chest pain, initially suspected to have pneumonia.  CT angiogram was negative for any acute pneumonia and actually showed improved inflammation from prior PET scan. Continue clinical monitoring although discontinue vancomycin and cefepime. Currently back on home oxygen, patient was also started on IV salmeterol for suspicion for COPD exacerbation currently appears to be well compensated as well. May have sustained some bronchospasm which currently has resolved. No change in medication on discharge.  2.  Abnormal CT scan. CT scan shows high density object in the ascending colon. Patient denies any abdominal pain. Do not suspect patient actually has an active GI bleed. Appears to have significant stool content and therefore will provide stool softener. Her H&H remained stable and there was no active bleeding noted despite patient having multiple BMs.  No further workup recommended at present.  3.  Chronic hypoxic respiratory failure. Continue 8 L of oxygen.  4.  Essential hypertension. Continue home medication.  5.  Paroxysmal A.  fib. Currently rate controlled.   All other chronic medical condition were stable during the hospitalization.  Patient was seen by physical therapy, who recommended no PT follow up needed On the day of the discharge the patient's vitals were stable, and no other acute medical condition were reported by patient.  the patient was felt safe to be discharge at [facility] home [facility].  Procedures and Results:  none   Consultations:  none  DISCHARGE MEDICATION: Allergies as of 09/22/2017      Reactions   Maxidex [dexamethasone] Other (See Comments)   DEHYDRATION AND HEART RACING   Advair Diskus [fluticasone-salmeterol] Other (See Comments)   No benefit with lungs (INEFFECTIVE)   Remeron [mirtazapine] Other (See Comments)   CAUSED NIGHTMARES   Trazodone And Nefazodone Other (See Comments)   Heart pounding   Tylox [oxycodone-acetaminophen] Itching      Medication List    TAKE these medications   acetaminophen 325 MG tablet Commonly known as:  TYLENOL Take 325-650 mg by mouth every 6 (six) hours as needed for headache.   albuterol (2.5 MG/3ML) 0.083% nebulizer solution Commonly known as:  PROVENTIL USE 1 VIAL (3 ML) VIA NEBULIZER EVERY 6 HOURS AS NEEDED FOR WHEEZING OR SHORTNESS OF BREATH   AMBULATORY NON FORMULARY MEDICATION Medication Name: Needs oxygen concentrator set to 6 liter per minutes as she has a long tubing on her machine.  Fax to [facility] What changed:  additional instructions   AMBULATORY NON FORMULARY MEDICATION Medication Name: GI cocktail: 172mL Lidocaine2%, 186ml Maalox, 41ml Donnatol. Ok to take twice daily. What changed:  Another medication with the same name was changed. Make sure you understand how and when to take each.   BREO ELLIPTA 100-25 MCG/INH Aepb Generic drug:  fluticasone furoate-vilanterol Inhale 1 puff into the lungs daily.   clopidogrel 75 MG tablet Commonly known as:  PLAVIX Take 1 tablet by mouth daily   docusate sodium 100 MG capsule Commonly known as:  COLACE Take 1 capsule (100 mg total) by mouth daily.   dronabinol 2.5 MG capsule Commonly known as:  MARINOL Take 1 capsule (2.5 mg total) by mouth 2 (two) times daily.   feeding supplement (BOOST BREEZE) Liqd Take 1 Can by mouth 3 (three) times daily.   guaiFENesin 600 MG 12 hr  tablet Commonly known as:  MUCINEX Take 1 tablet (600 mg total) by mouth 2 (two) times daily.   HYDROcodone-acetaminophen 10-325 MG tablet Commonly known as:  NORCO Take 1 tablet by mouth every 6 (six) hours as needed.   INCRUSE ELLIPTA 62.5 MCG/INH Aepb Generic drug:  umeclidinium bromide USE 1 INHALATION DAILY   lidocaine 2 % solution Commonly known as:  XYLOCAINE Use as directed 20 mLs in the mouth or throat 2 (two) times daily as needed.   losartan 50 MG tablet Commonly known as:  COZAAR Take 1 tablet (50 mg total) by mouth daily.   metoprolol tartrate 50 MG tablet Commonly known as:  LOPRESSOR Take 1 tablet (50 mg total) by mouth 2 (two) times daily.   multivitamin with minerals Tabs tablet Take 1 tablet by mouth daily. Pt uses One-A-Day brand   omeprazole 40 MG capsule Commonly known as:  PRILOSEC Take 1 capsule (40 mg total) by mouth 2 (two) times daily.   ondansetron 8 MG disintegrating tablet Commonly known as:  ZOFRAN-ODT PLACE 1 TABLET ON THE TONGUE EVERY 8 HOURS AS NEEDED FOR NAUSEA   polyethylene glycol packet Commonly known as:  MIRALAX / GLYCOLAX Take 17 g  by mouth daily.   promethazine 25 MG tablet Commonly known as:  PHENERGAN Take 1 tablet (25 mg total) by mouth every 8 (eight) hours as needed for nausea or vomiting.   ranitidine 150 MG tablet Commonly known as:  ZANTAC Take 1 tablet (150 mg total) by mouth daily.   traMADol 50 MG tablet Commonly known as:  ULTRAM Take 1 tablet (50 mg total) by mouth every 6 (six) hours as needed (pain).      Allergies  Allergen Reactions  . Maxidex [Dexamethasone] Other (See Comments)    DEHYDRATION AND HEART RACING  . Advair Diskus [Fluticasone-Salmeterol] Other (See Comments)    No benefit with lungs (INEFFECTIVE)  . Remeron [Mirtazapine] Other (See Comments)    CAUSED NIGHTMARES  . Trazodone And Nefazodone Other (See Comments)    Heart pounding  . Tylox [Oxycodone-Acetaminophen] Itching    Discharge Instructions    Diet - low sodium heart healthy   Complete by:  As directed    Discharge instructions   Complete by:  As directed    It is important that you read following instructions as well as go over your medication list with RN to help you understand your care after this hospitalization.  Discharge Instructions: Please follow-up with PCP in one week  Please request your primary care physician to go over all Hospital Tests and Procedure/Radiological results at the follow up,  Please get all Hospital records sent to your PCP by signing hospital release before you go home.   Do not take more than prescribed Pain, Sleep and Anxiety Medications. You were cared for by a hospitalist during your hospital stay. If you have any questions about your discharge medications or the care you received while you were in the hospital after you are discharged, you can call the unit and ask to speak with the hospitalist on call if the hospitalist that took care of you is not available.  Once you are discharged, your primary care physician will handle any further medical issues. Please note that NO REFILLS for any discharge medications will be authorized once you are discharged, as it is imperative that you return to your primary care physician (or establish a relationship with a primary care physician if you do not have one) for your aftercare needs so that they can reassess your need for medications and monitor your lab values. You Must read complete instructions/literature along with all the possible adverse reactions/side effects for all the Medicines you take and that have been prescribed to you. Take any new Medicines after you have completely understood and accept all the possible adverse reactions/side effects. Wear Seat belts while driving. If you have smoked or chewed Tobacco in the last 2 yrs please stop smoking and/or stop any Recreational drug use.   Increase activity slowly   Complete  by:  As directed      Discharge Exam: Filed Weights   09/20/17 0749 09/20/17 1412  Weight: 40.8 kg (90 lb) 39.3 kg (86 lb 9.6 oz)   Vitals:   09/22/17 0857 09/22/17 0958  BP:  (!) 140/48  Pulse:  75  Resp:    Temp:    SpO2: 99%    General: Appear in no distress, no Rash; Oral Mucosa moist. Cardiovascular: S1 and S2 Present, no Murmur, no JVD Respiratory: Bilateral Air entry present and bilateral basal Crackles, no wheezes Abdomen: Bowel Sound present, Soft and no tenderness Extremities: no Pedal edema, no calf tenderness Neurology: Grossly no focal neuro deficit.  The results of significant diagnostics from this hospitalization (including imaging, microbiology, ancillary and laboratory) are listed below for reference.    Significant Diagnostic Studies: Ct Angio Chest/abd/pel For Dissection W And/or Wo Contrast  Result Date: 09/20/2017 CLINICAL DATA:  Short of breath yesterday.  Left-sided chest pain. EXAM: CT ANGIOGRAPHY CHEST, ABDOMEN AND PELVIS TECHNIQUE: Multidetector CT imaging through the chest, abdomen and pelvis was performed using the standard protocol during bolus administration of intravenous contrast. Multiplanar reconstructed images and MIPs were obtained and reviewed to evaluate the vascular anatomy. CONTRAST:  150mL ISOVUE-370 IOPAMIDOL (ISOVUE-370) INJECTION 76% COMPARISON:  08/19/2017 and 07/18/2017 FINDINGS: CTA CHEST FINDINGS Cardiovascular: There are atherosclerotic calcifications involving the aortic arch and descending thoracic aorta. There is some irregular soft plaque in the descending thoracic aorta. There is no evidence of aortic dissection or intramural hematoma. Maximal diameter of the ascending aorta is 3.0 cm. Atherosclerotic calcifications of the great vessels are present without significant focal narrowing. Vertebral arteries are also patent within the confines of the examination. Minimal coronary artery calcifications. No obvious acute pulmonary  thromboembolism. Mediastinum/Nodes: No abnormal mediastinal adenopathy by measurement criteria. Thyroid remains heterogeneous. Esophagus is nondilated. No obvious esophageal mass. Small hiatal hernia is suspected. Lungs/Pleura: Stable left apical lung mass. Severe emphysema is stable. Patchy ground-glass and airspace opacities in both lower lobes left greater than right have improved compared to the prior PET-CT. Musculoskeletal: No acute rib fracture.  No acute bony deformity. Review of the MIP images confirms the above findings. CTA ABDOMEN AND PELVIS FINDINGS VASCULAR Aorta: There are diffuse atherosclerotic calcifications involving the abdominal aorta without evidence of dissection. The aorta is nonaneurysmal. It is markedly tortuous. Celiac: Patent.  Branch vessels patent. SMA: Atherosclerotic calcifications at the origin without significant focal narrowing. Branch vessels are grossly patent. Renals: 2 right renal arteries and a single left renal artery are patent. IMA: Diminutive and grossly patent. Inflow: There are atherosclerotic calcifications and some smooth plaque involving the bilateral common iliac arteries without significant focal narrowing. Internal and external iliac arteries are patent with minimal disease. Review of the MIP images confirms the above findings. NON-VASCULAR Hepatobiliary: Diffuse hepatic steatosis. Low-density lesions within the liver are grossly unchanged. Postcholecystectomy. Pancreas: Unremarkable Spleen: Unremarkable Adrenals/Urinary Tract: Adrenal glands are within normal limits. Tiny cyst in the lower pole of the right kidney. Left kidney is unremarkable. Bladder is within normal limits. Stomach/Bowel: There is high-density material layering in the cecum. There are also high density objects in the ascending colon on image 156 and within bowel in the central abdomen on image 157 of series 6. These areas most likely simply represent hyperdense bowel contents. Extravasation of  contrast to suggest gastrointestinal bleeding cannot be excluded. This should be correlated with patient's history. There is no evidence of small-bowel obstruction. There is no obvious mass in the colon. Diverticulosis of the sigmoid colon. Appendix is not inflamed. Lymphatic: No abnormal retroperitoneal adenopathy. Reproductive: Uterus is absent.  Adnexa are within normal limits. Other: No free fluid. Musculoskeletal: Levoscoliosis in the upper lumbar spine. Advanced degenerative disc disease in the lower lumbar spine. No vertebral compression deformity. 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 Review of the MIP images confirms the above findings. IMPRESSION: Vascular: There is no evidence of aortic dissection, aneurysm, or intramural hematoma. No acute vascular pathology in the abdomen. Atherosclerotic changes are noted. Nonvascular: Bilateral lower lobe airspace and ground-glass opacities left greater than right have improved supporting an inflammatory process. Stable left apical lung mass. There is high-density  material within bowel as described. This most likely represents high density bowel contents. If the patient has a history of lower gastrointestinal bleeding, active contrast extravasation from bleeding cannot be excluded. Correlate clinically. Electronically Signed   By: Marybelle Killings M.D.   On: 09/20/2017 09:41    Microbiology: Recent Results (from the past 240 hour(s))  Blood culture (routine x 2)     Status: None (Preliminary result)   Collection Time: 09/20/17 10:06 AM  Result Value Ref Range Status   Specimen Description   Final    BLOOD LEFT ARM Performed at [facility], [place] [place], [place] [address]    Special Requests   Final    BOTTLES DRAWN AEROBIC AND ANAEROBIC Blood Culture adequate volume Performed at [facility], [address] [place], [place] [address]    Culture   Final    NO GROWTH 4 DAYS Performed at [facility], [place] [address], [place], [place] [address]    Report Status PENDING  Incomplete  Blood culture (routine x 2)     Status: None (Preliminary result)   Collection Time: 09/20/17 10:51 AM  Result Value Ref Range Status   Specimen Description   Final    BLOOD LEFT ARM UPPER Performed at [facility], [place] [address], [place], [place] [address]    Special Requests   Final    BOTTLES DRAWN AEROBIC AND ANAEROBIC Blood Culture adequate volume Performed at [facility], [place] [place], [place] [address]    Culture   Final    NO GROWTH 4 DAYS Performed at [facility], [place] [address], [place], [place] [address]    Report Status PENDING  Incomplete  Culture, sputum-assessment     Status: None   Collection Time: 09/20/17  8:05 PM  Result Value Ref Range Status   Specimen Description EXPECTORATED SPUTUM  Final   Special Requests NONE  Final   Sputum evaluation   Final    THIS SPECIMEN IS ACCEPTABLE FOR SPUTUM CULTURE Performed at [facility], [place] [address], [place], [place] [address]    Report Status 09/20/2017 FINAL  Final  Culture, respiratory (NON-Expectorated)     Status: None   Collection Time: 09/20/17  8:05 PM  Result Value Ref Range Status   Specimen Description   Final    EXPECTORATED SPUTUM Performed at [facility] [address] [place], [place] [address]    Special Requests   Final    NONE Reflexed from [id number] Performed at [facility] [address] [place],  [address]    Gram Stain   Final    FEW WBC PRESENT, PREDOMINANTLY PMN FEW SQUAMOUS EPITHELIAL CELLS PRESENT RARE GRAM POSITIVE COCCI    Culture   Final    Consistent with normal respiratory flora. Performed at [facility], [place] [address], [place],  [address]    Report Status 09/23/2017 FINAL  Final     Labs: CBC: Recent Labs  Lab 09/18/17 1324 09/20/17 0758 09/21/17 0113  09/22/17 0505  WBC 5.3 6.1 5.2 6.8  NEUTROABS 2.9 4.4 4.7  --   HGB  --  10.8* 9.8* 9.2*  HCT 32.4* 33.6* 30.5* 29.0*  MCV 91.0 87.7 88.9 90.6  PLT 344 352 309 607   Basic Metabolic Panel: Recent Labs  Lab 09/18/17 1324 09/20/17 0758 09/21/17 0113 09/22/17 0505  NA 138  131* 132* 132*  K 5.6* 4.1 4.7 4.7  CL 99 92* 95* 94*  CO2 30* 29 29 31   GLUCOSE 124 121* 146* 88  BUN 7 6 12  21*  CREATININE 0.78 0.68 0.87 0.75  CALCIUM 9.2 8.9 9.3 8.9  MG  --   --   --  1.9   Liver Function Tests: Recent Labs  Lab 09/18/17 1324 09/20/17 0758  AST 24 26  ALT 18 15  ALKPHOS 78 74  BILITOT 0.2 0.2*  PROT 6.5 6.9  ALBUMIN 3.4* 3.7   Recent Labs  Lab 09/20/17 0758  LIPASE 28   No results for input(s): AMMONIA in the last 168 hours. Cardiac Enzymes: Recent Labs  Lab 09/20/17 0758 09/20/17 1327 09/20/17 1851 09/21/17 0113  TROPONINI <0.03 <0.03 <0.03 <0.03   BNP (last 3 results) No results for input(s): BNP in the last 8760 hours. CBG: No results for input(s): GLUCAP in the last 168 hours. Time spent: 35 minutes  Signed:  Berle Mull  [facility] 09/22/2017 , 9:56 PM

## 2017-09-25 ENCOUNTER — Telehealth: Payer: Self-pay | Admitting: Family Medicine

## 2017-09-25 ENCOUNTER — Encounter: Payer: Self-pay | Admitting: Radiation Oncology

## 2017-09-25 ENCOUNTER — Ambulatory Visit: Payer: Medicare Other | Admitting: Radiation Oncology

## 2017-09-25 LAB — CULTURE, BLOOD (ROUTINE X 2)
Culture: NO GROWTH
Culture: NO GROWTH
SPECIAL REQUESTS: ADEQUATE
Special Requests: ADEQUATE

## 2017-09-25 NOTE — Telephone Encounter (Signed)
After recent hospitalization for COPD, [facility] requesting:  Nursing 2x week for 6 weeks.   Verbal given.

## 2017-09-25 NOTE — Progress Notes (Signed)
  Radiation Oncology         [phone] ________________________________  Name: Tamara Adams MRN: [id number]  Date: 09/25/2017  DOB: Sep 26, 1940  End of Treatment Note  Diagnosis:   PET positive pulmonary nodule presenting in the apex of the left upper lung.     Indication for treatment:  Curative, not an operative candidate       Radiation treatment dates:   09/17/2017 to 09/23/2017  Site/dose:   The left upper lung was treated to 54 Gy in 3 fractions of 18 Gy.  Beams/energy:   SBRT // 6X  Narrative: The patient tolerated stereotactic body radiotherapy relatively well. She reports occasional cough with white sputum and moderate shortness of breath. She also reports moderate fatigue. She denies any hemoptysis or difficulty swallowing. She denies having skin changes.   The patient was admitted to the hospital on 09/20/2017 complaining of chest pain and COPD exacerbation. She was discharged home on 09/22/2017.  Plan: The patient has completed radiation treatment. The patient will return to radiation oncology clinic for routine followup in one month. I advised them to call or return sooner if they have any questions or concerns related to their recovery or treatment.  -----------------------------------  Blair Promise, PhD, MD  This document serves as a record of services personally performed by Gery Pray, MD. It was created on his behalf by Arlyce Harman, a trained medical scribe. The creation of this record is based on the scribe's personal observations and the provider's statements to them. This document has been checked and approved by the attending provider.

## 2017-09-26 DIAGNOSIS — J449 Chronic obstructive pulmonary disease, unspecified: Secondary | ICD-10-CM | POA: Diagnosis not present

## 2017-09-26 DIAGNOSIS — I481 Persistent atrial fibrillation: Secondary | ICD-10-CM | POA: Diagnosis not present

## 2017-09-26 DIAGNOSIS — E43 Unspecified severe protein-calorie malnutrition: Secondary | ICD-10-CM | POA: Diagnosis not present

## 2017-09-26 DIAGNOSIS — I10 Essential (primary) hypertension: Secondary | ICD-10-CM | POA: Diagnosis not present

## 2017-09-26 DIAGNOSIS — I214 Non-ST elevation (NSTEMI) myocardial infarction: Secondary | ICD-10-CM | POA: Diagnosis not present

## 2017-09-26 DIAGNOSIS — Z9981 Dependence on supplemental oxygen: Secondary | ICD-10-CM | POA: Diagnosis not present

## 2017-09-26 NOTE — Telephone Encounter (Signed)
Agree with above 

## 2017-09-27 ENCOUNTER — Ambulatory Visit: Payer: Medicare Other | Admitting: Radiation Oncology

## 2017-09-27 ENCOUNTER — Ambulatory Visit (INDEPENDENT_AMBULATORY_CARE_PROVIDER_SITE_OTHER): Payer: Medicare Other | Admitting: Family Medicine

## 2017-09-27 ENCOUNTER — Encounter: Payer: Self-pay | Admitting: Family Medicine

## 2017-09-27 VITALS — BP 165/72 | HR 80 | Temp 98.5°F | Resp 16 | Ht 64.0 in | Wt 93.4 lb

## 2017-09-27 DIAGNOSIS — E538 Deficiency of other specified B group vitamins: Secondary | ICD-10-CM | POA: Diagnosis not present

## 2017-09-27 DIAGNOSIS — R630 Anorexia: Secondary | ICD-10-CM

## 2017-09-27 DIAGNOSIS — R0789 Other chest pain: Secondary | ICD-10-CM | POA: Diagnosis not present

## 2017-09-27 DIAGNOSIS — J441 Chronic obstructive pulmonary disease with (acute) exacerbation: Secondary | ICD-10-CM | POA: Diagnosis not present

## 2017-09-27 DIAGNOSIS — J9611 Chronic respiratory failure with hypoxia: Secondary | ICD-10-CM | POA: Diagnosis not present

## 2017-09-27 DIAGNOSIS — J189 Pneumonia, unspecified organism: Secondary | ICD-10-CM | POA: Diagnosis not present

## 2017-09-27 DIAGNOSIS — J440 Chronic obstructive pulmonary disease with acute lower respiratory infection: Secondary | ICD-10-CM | POA: Diagnosis not present

## 2017-09-27 DIAGNOSIS — I252 Old myocardial infarction: Secondary | ICD-10-CM | POA: Diagnosis not present

## 2017-09-27 DIAGNOSIS — R530 Neoplastic (malignant) related fatigue: Secondary | ICD-10-CM | POA: Diagnosis not present

## 2017-09-27 DIAGNOSIS — R110 Nausea: Secondary | ICD-10-CM

## 2017-09-27 DIAGNOSIS — I10 Essential (primary) hypertension: Secondary | ICD-10-CM | POA: Diagnosis not present

## 2017-09-27 DIAGNOSIS — Z9981 Dependence on supplemental oxygen: Secondary | ICD-10-CM | POA: Diagnosis not present

## 2017-09-27 DIAGNOSIS — I481 Persistent atrial fibrillation: Secondary | ICD-10-CM | POA: Diagnosis not present

## 2017-09-27 DIAGNOSIS — C3490 Malignant neoplasm of unspecified part of unspecified bronchus or lung: Secondary | ICD-10-CM | POA: Diagnosis not present

## 2017-09-27 DIAGNOSIS — E43 Unspecified severe protein-calorie malnutrition: Secondary | ICD-10-CM | POA: Diagnosis not present

## 2017-09-27 MED ORDER — PREDNISONE 10 MG PO TABS: 10.0000 mg | ORAL_TABLET | Freq: Every day | ORAL | 0 refills | Status: DC

## 2017-09-27 MED ORDER — CYANOCOBALAMIN 1000 MCG/ML IJ SOLN
1000.0000 ug | Freq: Once | INTRAMUSCULAR | Status: AC
  Administered 2017-09-27: 1000 ug via INTRAMUSCULAR

## 2017-09-27 NOTE — Progress Notes (Signed)
Subjective:    Patient ID: Tamara Adams, female    DOB: 04/30/1941, 77 y.o.   MRN: [id number]  HPI 77 year old female is here today to follow-up for recent hospitalization at [facility].  She was admitted on March 1 and discharged home 2 days later on March 3 for acute on chronic hypoxic respiratory failure.  She was discharged home.  She is noticed since being home that her heart rate has been a little higher than normal.  Though it does seem better today.  She admits she has not been eating and drinking as much.  She is been more nauseated particularly after her radiation treatments.  She feels as a direct correlation between the radiation to her chest and when she experiences the chest pain nausea and anorexia.  In regards to her COPD she is still having some mucus production but has been taking her Mucinex regularly.  She also feels extremely fatigued since starting the radiation treatments to the point where she says it is an effort to just get up and go to the bathroom.  She still wearing 8 L of oxygen.  She has a PET scan scheduled for the middle of April and had her last radiation treatment on Monday.  She is hoping she will start to feel better in the next couple weeks but wants to know if she could try may be some prednisone to help her.  She says she feels better and has more energy when she takes prednisone for her COPD exacerbations.  Review of Systems BP (!) 165/72   Pulse 80   Temp 98.5 F (36.9 C) (Oral)   Resp 16   Ht 5\' 4"  (1.626 m)   Wt 93 lb 6.4 oz (42.4 kg)   SpO2 98%   BMI 16.03 kg/m     Allergies  Allergen Reactions  . Maxidex [Dexamethasone] Other (See Comments)    DEHYDRATION AND HEART RACING  . Advair Diskus [Fluticasone-Salmeterol] Other (See Comments)    No benefit with lungs (INEFFECTIVE)  . Remeron [Mirtazapine] Other (See Comments)    CAUSED NIGHTMARES  . Trazodone And Nefazodone Other (See Comments)    Heart pounding  . Tylox [Oxycodone-Acetaminophen]  Itching    Past Medical History:  Diagnosis Date  . Anemia    as a child  . Arthritis    "hands" (12/24/2014)  . Complication of anesthesia    " My blood gas dropped during surgery so I was left on a ventilator and in ICU for 3 days."  . COPD (chronic obstructive pulmonary disease) (HCC)    FVC 72%, FEV1 29%, FEV1 ratio 32% (very severe (COPD)  . COPD (chronic obstructive pulmonary disease) ([place])   . Degenerative disc disease   . Depression with anxiety   . Diverticulosis   . DVT (deep venous thrombosis) ([place])    "LLE; years ago after a surgery"  . Essential hypertension   . Fluttering sensation of heart    pt put on metoprolol as a result  . GERD (gastroesophageal reflux disease)    PMH  . H/O hiatal hernia   . Headache    "maybe weekly" (12/24/2014)  . Hyperlipidemia   . Hypertension   . Kidney stone   . Liver lesion   . MI (myocardial infarction) ([place]) 07/19/2017  . On home oxygen therapy    "3L; sleep w/it; use it when I rest" (12/24/2014)  . Peptic ulcer   . Pinched nerve    in back  . Pneumonia   .  PONV (postoperative nausea and vomiting)   . Shortness of breath dyspnea    with exertion  . Skipped heart beats   . Small bowel obstruction ([place])    "several times; OR twice" (12/24/2014)  . Tubular adenoma of colon 09/2011  . Wears glasses     Past Surgical History:  Procedure Laterality Date  . ABDOMINAL ADHESION SURGERY  12/24/2014  . ABDOMINAL HYSTERECTOMY  1987   and one ovary  . CATARACT EXTRACTION W/ INTRAOCULAR LENS  IMPLANT, BILATERAL Right 06/22/2012   Dr. Lester Kinsman.   Marland Kitchen CATARACT EXTRACTION W/PHACO Left 06/12/2012   Dr. Boykin Reaper  . CHOLECYSTECTOMY N/A 08/18/2013   Procedure: LAPAROSCOPIC CHOLECYSTECTOMY;  Surgeon: Harl Bowie, MD;  Location: [facility];  Service: General;  Laterality: N/A;  . DILATION AND CURETTAGE OF UTERUS    . EXPLORATORY LAPAROTOMY  12/24/2014  . HERNIA REPAIR    . INCISIONAL HERNIA REPAIR  12/24/2014  . INCISIONAL HERNIA REPAIR N/A  12/24/2014   Procedure: HERNIA REPAIR INCISIONAL;  Surgeon: Coralie Keens, MD;  Location: [facility];  Service: General;  Laterality: N/A;  . LAPAROTOMY N/A 12/24/2014   Procedure: EXPLORATORY LAPAROTOMY;  Surgeon: Coralie Keens, MD;  Location: [facility];  Service: General;  Laterality: N/A;  . LYSIS OF ADHESION N/A 12/24/2014   Procedure: LYSIS OF ADHESION;  Surgeon: Coralie Keens, MD;  Location: [facility];  Service: General;  Laterality: N/A;  . MULTIPLE TOOTH EXTRACTIONS    . OOPHORECTOMY  1992  . SMALL INTESTINE SURGERY     for a blockage, no bowel was removed, just kinked from scar tissue  . TUBAL LIGATION    . URETHRAL DILATION      Social History   Socioeconomic History  . Marital status: Married    Spouse name: Not on file  . Number of children: 3  . Years of education: Not on file  . Highest education level: Not on file  Social Needs  . Financial resource strain: Not on file  . Food insecurity - worry: Not on file  . Food insecurity - inability: Not on file  . Transportation needs - medical: Not on file  . Transportation needs - non-medical: Not on file  Occupational History  . Occupation: retired.    Tobacco Use  . Smoking status: Former Smoker    Packs/day: 0.25    Years: 57.00    Pack years: 14.25    Types: Cigarettes    Last attempt to quit: 07/25/2015    Years since quitting: 2.1  . Smokeless tobacco: Never Used  . Tobacco comment: 5 cigarettes a day  Substance and Sexual Activity  . Alcohol use: No    Alcohol/week: 0.0 oz  . Drug use: No  . Sexual activity: No  Other Topics Concern  . Not on file  Social History Narrative   No regular exercise.     Family History  Problem Relation Age of Onset  . Hypertension Mother   . Ovarian cancer Mother   . Glaucoma Mother   . Glaucoma Sister   . Pancreatic cancer Maternal Uncle   . Colon cancer Neg Hx   . Colon polyps Neg Hx   . Diabetes Neg Hx   . Kidney disease Neg Hx   . Gallbladder disease Neg Hx   .  Esophageal cancer Neg Hx     Outpatient Encounter Medications as of 09/27/2017  Medication Sig  . acetaminophen (TYLENOL) 325 MG tablet Take 325-650 mg by mouth every 6 (six) hours  as needed for headache.  . albuterol (PROVENTIL) (2.5 MG/3ML) 0.083% nebulizer solution USE 1 VIAL (3 ML) VIA NEBULIZER EVERY 6 HOURS AS NEEDED FOR WHEEZING OR SHORTNESS OF BREATH  . AMBULATORY NON FORMULARY MEDICATION Medication Name: Needs oxygen concentrator set to 6 liter per minutes as she has a long tubing on her machine.  Fax to [facility] (Patient taking differently: Medication Name: Needs oxygen concentrator set to 8 liter per minutes as she has a long tubing on her machine.  Fax to [employer] [facility] [facility])  . AMBULATORY NON FORMULARY MEDICATION Medication Name: GI cocktail: 146mL Lidocaine2%, 15ml Maalox, 12ml Donnatol. Ok to take twice daily.  . clopidogrel (PLAVIX) 75 MG tablet Take 1 tablet by mouth daily  . docusate sodium (COLACE) 100 MG capsule Take 1 capsule (100 mg total) by mouth daily.  Marland Kitchen dronabinol (MARINOL) 2.5 MG capsule Take 1 capsule (2.5 mg total) by mouth 2 (two) times daily.  . fluticasone furoate-vilanterol (BREO ELLIPTA) 100-25 MCG/INH AEPB Inhale 1 puff into the lungs daily.  Marland Kitchen guaiFENesin (MUCINEX) 600 MG 12 hr tablet Take 1 tablet (600 mg total) by mouth 2 (two) times daily.  Marland Kitchen HYDROcodone-acetaminophen (NORCO) 10-325 MG tablet Take 1 tablet by mouth every 6 (six) hours as needed.  . INCRUSE ELLIPTA 62.5 MCG/INH AEPB USE 1 INHALATION DAILY  . lidocaine (XYLOCAINE) 2 % solution Use as directed 20 mLs in the mouth or throat 2 (two) times daily as needed.  Marland Kitchen losartan (COZAAR) 50 MG tablet Take 1 tablet (50 mg total) by mouth daily.  . metoprolol tartrate (LOPRESSOR) 50 MG tablet Take 1 tablet (50 mg total) by mouth 2 (two) times daily.  . Multiple Vitamin (MULTIVITAMIN WITH MINERALS) TABS tablet Take 1 tablet by mouth daily. Pt uses One-A-Day brand  . Nutritional Supplements (FEEDING SUPPLEMENT, BOOST BREEZE,)  LIQD Take 1 Can by mouth 3 (three) times daily.  Marland Kitchen omeprazole (PRILOSEC) 40 MG capsule Take 1 capsule (40 mg total) by mouth 2 (two) times daily.  . ondansetron (ZOFRAN-ODT) 8 MG disintegrating tablet PLACE 1 TABLET ON THE TONGUE EVERY 8 HOURS AS NEEDED FOR NAUSEA  . polyethylene glycol (MIRALAX / GLYCOLAX) packet Take 17 g by mouth daily.  . promethazine (PHENERGAN) 25 MG tablet Take 1 tablet (25 mg total) by mouth every 8 (eight) hours as needed for nausea or vomiting.  . ranitidine (ZANTAC) 150 MG tablet Take 1 tablet (150 mg total) by mouth daily.  . traMADol (ULTRAM) 50 MG tablet Take 1 tablet (50 mg total) by mouth every 6 (six) hours as needed (pain).  . predniSONE (DELTASONE) 10 MG tablet Take 1 tablet (10 mg total) by mouth daily with breakfast.  . [EXPIRED] cyanocobalamin ((VITAMIN B-12)) injection 1,000 mcg    No facility-administered encounter medications on file as of 09/27/2017.          Objective:   Physical Exam  Constitutional: She is oriented to person, place, and time. She appears well-developed and well-nourished.  HENT:  Head: Normocephalic and atraumatic.  Cardiovascular: Normal rate, regular rhythm and normal heart sounds.  Pulmonary/Chest: Effort normal. She has wheezes.  Diffuse expiratory wheeze but loudest on the left.   Neurological: She is alert and oriented to person, place, and time.  Skin: Skin is warm and dry.  Psychiatric: She has a normal mood and affect. Her behavior is normal.       Assessment & Plan:  COPD  - severed.  Not quite back at baseline    Atypical chest pain - likely  secondary to her radiation tx.  Had negative w/u at the hospital.  Hopefully will be feeling better in a week or two    Lung cancer - she isn't sure what she will do if they recommend further treatment that make her feel worse.   Extreme fatigue - secondary to severe COPD and radiation tx.   Nausea and anorexia - work on staying hydrated.     B12 def - given injection  today.

## 2017-09-30 DIAGNOSIS — J441 Chronic obstructive pulmonary disease with (acute) exacerbation: Secondary | ICD-10-CM | POA: Diagnosis not present

## 2017-09-30 DIAGNOSIS — J189 Pneumonia, unspecified organism: Secondary | ICD-10-CM | POA: Diagnosis not present

## 2017-09-30 DIAGNOSIS — C3490 Malignant neoplasm of unspecified part of unspecified bronchus or lung: Secondary | ICD-10-CM | POA: Diagnosis not present

## 2017-09-30 DIAGNOSIS — I481 Persistent atrial fibrillation: Secondary | ICD-10-CM | POA: Diagnosis not present

## 2017-09-30 DIAGNOSIS — J440 Chronic obstructive pulmonary disease with acute lower respiratory infection: Secondary | ICD-10-CM | POA: Diagnosis not present

## 2017-09-30 DIAGNOSIS — I10 Essential (primary) hypertension: Secondary | ICD-10-CM | POA: Diagnosis not present

## 2017-10-03 ENCOUNTER — Other Ambulatory Visit: Payer: Self-pay | Admitting: Family Medicine

## 2017-10-03 ENCOUNTER — Inpatient Hospital Stay: Payer: Medicare Other | Admitting: Family Medicine

## 2017-10-03 DIAGNOSIS — I481 Persistent atrial fibrillation: Secondary | ICD-10-CM | POA: Diagnosis not present

## 2017-10-03 DIAGNOSIS — C3490 Malignant neoplasm of unspecified part of unspecified bronchus or lung: Secondary | ICD-10-CM | POA: Diagnosis not present

## 2017-10-03 DIAGNOSIS — J189 Pneumonia, unspecified organism: Secondary | ICD-10-CM | POA: Diagnosis not present

## 2017-10-03 DIAGNOSIS — I10 Essential (primary) hypertension: Secondary | ICD-10-CM | POA: Diagnosis not present

## 2017-10-03 DIAGNOSIS — J441 Chronic obstructive pulmonary disease with (acute) exacerbation: Secondary | ICD-10-CM | POA: Diagnosis not present

## 2017-10-03 DIAGNOSIS — J440 Chronic obstructive pulmonary disease with acute lower respiratory infection: Secondary | ICD-10-CM | POA: Diagnosis not present

## 2017-10-04 ENCOUNTER — Other Ambulatory Visit: Payer: Self-pay

## 2017-10-04 MED ORDER — DRONABINOL 2.5 MG PO CAPS: 2.5000 mg | ORAL_CAPSULE | Freq: Two times a day (BID) | ORAL | 1 refills | Status: DC

## 2017-10-04 NOTE — Telephone Encounter (Signed)
Pt called requesting refill for her Merinol 2.5 mg capsules 1 BID QD to be sent to [facility].   Last RX sent 07-25-17 for #60 with 2 refills.

## 2017-10-07 ENCOUNTER — Telehealth: Payer: Self-pay

## 2017-10-07 DIAGNOSIS — J189 Pneumonia, unspecified organism: Secondary | ICD-10-CM | POA: Diagnosis not present

## 2017-10-07 DIAGNOSIS — J441 Chronic obstructive pulmonary disease with (acute) exacerbation: Secondary | ICD-10-CM | POA: Diagnosis not present

## 2017-10-07 DIAGNOSIS — I481 Persistent atrial fibrillation: Secondary | ICD-10-CM | POA: Diagnosis not present

## 2017-10-07 DIAGNOSIS — C3490 Malignant neoplasm of unspecified part of unspecified bronchus or lung: Secondary | ICD-10-CM | POA: Diagnosis not present

## 2017-10-07 DIAGNOSIS — J440 Chronic obstructive pulmonary disease with acute lower respiratory infection: Secondary | ICD-10-CM | POA: Diagnosis not present

## 2017-10-07 DIAGNOSIS — I10 Essential (primary) hypertension: Secondary | ICD-10-CM | POA: Diagnosis not present

## 2017-10-07 MED ORDER — DRONABINOL 5 MG PO CAPS: 5.0000 mg | ORAL_CAPSULE | Freq: Two times a day (BID) | ORAL | 1 refills | Status: DC

## 2017-10-07 NOTE — Telephone Encounter (Signed)
OK, new script sent.

## 2017-10-07 NOTE — Telephone Encounter (Signed)
Tamara Adams from [facility] called and states [name] is requesting an increase to the Marinol to 5 mg bid. She states it has stopped working as well as it did in the past. Please advise.

## 2017-10-08 DIAGNOSIS — J441 Chronic obstructive pulmonary disease with (acute) exacerbation: Secondary | ICD-10-CM | POA: Diagnosis not present

## 2017-10-08 DIAGNOSIS — J189 Pneumonia, unspecified organism: Secondary | ICD-10-CM | POA: Diagnosis not present

## 2017-10-08 DIAGNOSIS — I10 Essential (primary) hypertension: Secondary | ICD-10-CM | POA: Diagnosis not present

## 2017-10-08 DIAGNOSIS — J440 Chronic obstructive pulmonary disease with acute lower respiratory infection: Secondary | ICD-10-CM | POA: Diagnosis not present

## 2017-10-08 DIAGNOSIS — C3490 Malignant neoplasm of unspecified part of unspecified bronchus or lung: Secondary | ICD-10-CM | POA: Diagnosis not present

## 2017-10-08 DIAGNOSIS — I481 Persistent atrial fibrillation: Secondary | ICD-10-CM | POA: Diagnosis not present

## 2017-10-08 NOTE — Telephone Encounter (Signed)
Patient advised.

## 2017-10-09 DIAGNOSIS — C3490 Malignant neoplasm of unspecified part of unspecified bronchus or lung: Secondary | ICD-10-CM | POA: Diagnosis not present

## 2017-10-09 DIAGNOSIS — J440 Chronic obstructive pulmonary disease with acute lower respiratory infection: Secondary | ICD-10-CM | POA: Diagnosis not present

## 2017-10-09 DIAGNOSIS — J189 Pneumonia, unspecified organism: Secondary | ICD-10-CM | POA: Diagnosis not present

## 2017-10-09 DIAGNOSIS — J441 Chronic obstructive pulmonary disease with (acute) exacerbation: Secondary | ICD-10-CM | POA: Diagnosis not present

## 2017-10-09 DIAGNOSIS — I10 Essential (primary) hypertension: Secondary | ICD-10-CM | POA: Diagnosis not present

## 2017-10-09 DIAGNOSIS — I481 Persistent atrial fibrillation: Secondary | ICD-10-CM | POA: Diagnosis not present

## 2017-10-14 DIAGNOSIS — J440 Chronic obstructive pulmonary disease with acute lower respiratory infection: Secondary | ICD-10-CM | POA: Diagnosis not present

## 2017-10-14 DIAGNOSIS — I481 Persistent atrial fibrillation: Secondary | ICD-10-CM | POA: Diagnosis not present

## 2017-10-14 DIAGNOSIS — C3490 Malignant neoplasm of unspecified part of unspecified bronchus or lung: Secondary | ICD-10-CM | POA: Diagnosis not present

## 2017-10-14 DIAGNOSIS — I10 Essential (primary) hypertension: Secondary | ICD-10-CM | POA: Diagnosis not present

## 2017-10-14 DIAGNOSIS — J189 Pneumonia, unspecified organism: Secondary | ICD-10-CM | POA: Diagnosis not present

## 2017-10-14 DIAGNOSIS — J441 Chronic obstructive pulmonary disease with (acute) exacerbation: Secondary | ICD-10-CM | POA: Diagnosis not present

## 2017-10-15 DIAGNOSIS — I10 Essential (primary) hypertension: Secondary | ICD-10-CM | POA: Diagnosis not present

## 2017-10-15 DIAGNOSIS — I481 Persistent atrial fibrillation: Secondary | ICD-10-CM | POA: Diagnosis not present

## 2017-10-15 DIAGNOSIS — J440 Chronic obstructive pulmonary disease with acute lower respiratory infection: Secondary | ICD-10-CM | POA: Diagnosis not present

## 2017-10-15 DIAGNOSIS — C3490 Malignant neoplasm of unspecified part of unspecified bronchus or lung: Secondary | ICD-10-CM | POA: Diagnosis not present

## 2017-10-15 DIAGNOSIS — J189 Pneumonia, unspecified organism: Secondary | ICD-10-CM | POA: Diagnosis not present

## 2017-10-15 DIAGNOSIS — J441 Chronic obstructive pulmonary disease with (acute) exacerbation: Secondary | ICD-10-CM | POA: Diagnosis not present

## 2017-10-16 DIAGNOSIS — I481 Persistent atrial fibrillation: Secondary | ICD-10-CM | POA: Diagnosis not present

## 2017-10-16 DIAGNOSIS — J440 Chronic obstructive pulmonary disease with acute lower respiratory infection: Secondary | ICD-10-CM | POA: Diagnosis not present

## 2017-10-16 DIAGNOSIS — I10 Essential (primary) hypertension: Secondary | ICD-10-CM | POA: Diagnosis not present

## 2017-10-16 DIAGNOSIS — C3490 Malignant neoplasm of unspecified part of unspecified bronchus or lung: Secondary | ICD-10-CM | POA: Diagnosis not present

## 2017-10-16 DIAGNOSIS — J189 Pneumonia, unspecified organism: Secondary | ICD-10-CM | POA: Diagnosis not present

## 2017-10-16 DIAGNOSIS — J441 Chronic obstructive pulmonary disease with (acute) exacerbation: Secondary | ICD-10-CM | POA: Diagnosis not present

## 2017-10-22 DIAGNOSIS — I481 Persistent atrial fibrillation: Secondary | ICD-10-CM | POA: Diagnosis not present

## 2017-10-22 DIAGNOSIS — C3490 Malignant neoplasm of unspecified part of unspecified bronchus or lung: Secondary | ICD-10-CM | POA: Diagnosis not present

## 2017-10-22 DIAGNOSIS — J441 Chronic obstructive pulmonary disease with (acute) exacerbation: Secondary | ICD-10-CM | POA: Diagnosis not present

## 2017-10-22 DIAGNOSIS — J189 Pneumonia, unspecified organism: Secondary | ICD-10-CM | POA: Diagnosis not present

## 2017-10-22 DIAGNOSIS — I10 Essential (primary) hypertension: Secondary | ICD-10-CM | POA: Diagnosis not present

## 2017-10-22 DIAGNOSIS — J440 Chronic obstructive pulmonary disease with acute lower respiratory infection: Secondary | ICD-10-CM | POA: Diagnosis not present

## 2017-10-23 DIAGNOSIS — I481 Persistent atrial fibrillation: Secondary | ICD-10-CM | POA: Diagnosis not present

## 2017-10-23 DIAGNOSIS — I10 Essential (primary) hypertension: Secondary | ICD-10-CM | POA: Diagnosis not present

## 2017-10-23 DIAGNOSIS — J441 Chronic obstructive pulmonary disease with (acute) exacerbation: Secondary | ICD-10-CM | POA: Diagnosis not present

## 2017-10-23 DIAGNOSIS — J440 Chronic obstructive pulmonary disease with acute lower respiratory infection: Secondary | ICD-10-CM | POA: Diagnosis not present

## 2017-10-23 DIAGNOSIS — J189 Pneumonia, unspecified organism: Secondary | ICD-10-CM | POA: Diagnosis not present

## 2017-10-23 DIAGNOSIS — C3490 Malignant neoplasm of unspecified part of unspecified bronchus or lung: Secondary | ICD-10-CM | POA: Diagnosis not present

## 2017-10-24 ENCOUNTER — Encounter: Payer: Self-pay | Admitting: Radiation Oncology

## 2017-10-24 ENCOUNTER — Ambulatory Visit
Admission: RE | Admit: 2017-10-24 | Discharge: 2017-10-24 | Disposition: A | Discharge status: Home / Self Care | Payer: Medicare Other | Source: Ambulatory Visit | Attending: Radiation Oncology | Admitting: Radiation Oncology

## 2017-10-24 ENCOUNTER — Telehealth: Payer: Self-pay | Admitting: Oncology

## 2017-10-24 ENCOUNTER — Other Ambulatory Visit: Payer: Self-pay

## 2017-10-24 VITALS — BP 163/65 | HR 76 | Temp 98.4°F | Resp 18 | Wt 99.4 lb

## 2017-10-24 DIAGNOSIS — R911 Solitary pulmonary nodule: Secondary | ICD-10-CM | POA: Insufficient documentation

## 2017-10-24 DIAGNOSIS — C3490 Malignant neoplasm of unspecified part of unspecified bronchus or lung: Secondary | ICD-10-CM | POA: Diagnosis not present

## 2017-10-24 DIAGNOSIS — R0602 Shortness of breath: Secondary | ICD-10-CM | POA: Diagnosis not present

## 2017-10-24 DIAGNOSIS — I10 Essential (primary) hypertension: Secondary | ICD-10-CM | POA: Diagnosis not present

## 2017-10-24 DIAGNOSIS — Z923 Personal history of irradiation: Secondary | ICD-10-CM | POA: Diagnosis not present

## 2017-10-24 DIAGNOSIS — Y842 Radiological procedure and radiotherapy as the cause of abnormal reaction of the patient, or of later complication, without mention of misadventure at the time of the procedure: Secondary | ICD-10-CM | POA: Diagnosis not present

## 2017-10-24 DIAGNOSIS — R5383 Other fatigue: Secondary | ICD-10-CM | POA: Insufficient documentation

## 2017-10-24 DIAGNOSIS — Z7902 Long term (current) use of antithrombotics/antiplatelets: Secondary | ICD-10-CM | POA: Insufficient documentation

## 2017-10-24 DIAGNOSIS — Z888 Allergy status to other drugs, medicaments and biological substances status: Secondary | ICD-10-CM | POA: Insufficient documentation

## 2017-10-24 DIAGNOSIS — Z79899 Other long term (current) drug therapy: Secondary | ICD-10-CM | POA: Insufficient documentation

## 2017-10-24 DIAGNOSIS — I481 Persistent atrial fibrillation: Secondary | ICD-10-CM | POA: Diagnosis not present

## 2017-10-24 DIAGNOSIS — J189 Pneumonia, unspecified organism: Secondary | ICD-10-CM | POA: Diagnosis not present

## 2017-10-24 DIAGNOSIS — J440 Chronic obstructive pulmonary disease with acute lower respiratory infection: Secondary | ICD-10-CM | POA: Diagnosis not present

## 2017-10-24 DIAGNOSIS — J441 Chronic obstructive pulmonary disease with (acute) exacerbation: Secondary | ICD-10-CM | POA: Diagnosis not present

## 2017-10-24 NOTE — Progress Notes (Signed)
Radiation Oncology         [phone] ________________________________  Name: Tamara Adams MRN: [id number]  Date: 10/24/2017  DOB: 1940/11/08  Follow-Up Visit Note  CC: Tamara Marry, MD  Tamara Napoleon, MD    ICD-10-CM   1. Nodule of left lung R91.1 CT Chest Wo Contrast    Diagnosis:  PET positive pulmonary nodule presenting in the apex of the left upper      Interval Since Last Radiation:  1 months   Indication for treatment:  Curative, not an operative candidate       Radiation treatment dates:   09/17/2017 to 09/23/2017  Site/dose:   The left upper lung was treated to 54 Gy in 3 fractions of 18 Gy.  Beams/energy:   SBRT // 6X    Narrative:  The patient returns today for routine follow-up.  She has noticed more fatigue since completion of her radiation therapy.  She denies any chest pain at this time or cough.  She denies any hemoptysis.  Patient notices increased dyspnea but her oxygen saturation is remaining around 100% at home.  She is on 6-8 L of oxygen by nasal cannula.                              ALLERGIES:  is allergic to maxidex [dexamethasone]; advair diskus [fluticasone-salmeterol]; remeron [mirtazapine]; trazodone and nefazodone; and tylox [oxycodone-acetaminophen].  Meds: Current Outpatient Medications  Medication Sig Dispense Refill  . acetaminophen (TYLENOL) 325 MG tablet Take 325-650 mg by mouth every 6 (six) hours as needed for headache.    . albuterol (PROVENTIL) (2.5 MG/3ML) 0.083% nebulizer solution USE 1 VIAL (3 ML) VIA NEBULIZER EVERY 6 HOURS AS NEEDED FOR WHEEZING OR SHORTNESS OF BREATH 810 mL 12  . AMBULATORY NON FORMULARY MEDICATION Medication Name: Needs oxygen concentrator set to 6 liter per minutes as she has a long tubing on her machine.  Fax to [facility] (Patient taking differently: Medication Name: Needs oxygen concentrator set to 8 liter per minutes as she has a long tubing on her machine.  Fax to [facility]) 1 vial 0  . AMBULATORY NON  FORMULARY MEDICATION Medication Name: GI cocktail: 130mL Lidocaine2%, 11ml Maalox, 24ml Donnatol. Ok to take twice daily. 250 mL 3  . BREO ELLIPTA 100-25 MCG/INH AEPB USE 1 INHALATION DAILY 360 each 2  . clopidogrel (PLAVIX) 75 MG tablet Take 1 tablet by mouth daily 90 tablet 1  . docusate sodium (COLACE) 100 MG capsule Take 1 capsule (100 mg total) by mouth daily. 30 capsule 0  . dronabinol (MARINOL) 5 MG capsule Take 1 capsule (5 mg total) by mouth 2 (two) times daily. 60 capsule 1  . guaiFENesin (MUCINEX) 600 MG 12 hr tablet Take 1 tablet (600 mg total) by mouth 2 (two) times daily. 30 tablet 0  . HYDROcodone-acetaminophen (NORCO) 10-325 MG tablet Take 1 tablet by mouth every 6 (six) hours as needed. 60 tablet 0  . INCRUSE ELLIPTA 62.5 MCG/INH AEPB USE 1 INHALATION DAILY 90 each 3  . lidocaine (XYLOCAINE) 2 % solution Use as directed 20 mLs in the mouth or throat 2 (two) times daily as needed. 900 mL 3  . losartan (COZAAR) 50 MG tablet Take 1 tablet (50 mg total) by mouth daily. 90 tablet 3  . metoprolol tartrate (LOPRESSOR) 50 MG tablet Take 1 tablet (50 mg total) by mouth 2 (two) times daily. 180 tablet 2  . Multiple Vitamin (MULTIVITAMIN  WITH MINERALS) TABS tablet Take 1 tablet by mouth daily. Pt uses One-A-Day brand    . Nutritional Supplements (FEEDING SUPPLEMENT, BOOST BREEZE,) LIQD Take 1 Can by mouth 3 (three) times daily. 120 Can 0  . omeprazole (PRILOSEC) 40 MG capsule Take 1 capsule (40 mg total) by mouth 2 (two) times daily. 60 capsule 3  . ondansetron (ZOFRAN-ODT) 8 MG disintegrating tablet PLACE 1 TABLET ON THE TONGUE EVERY 8 HOURS AS NEEDED FOR NAUSEA 90 tablet 1  . polyethylene glycol (MIRALAX / GLYCOLAX) packet Take 17 g by mouth daily. 14 each 0  . promethazine (PHENERGAN) 25 MG tablet Take 1 tablet (25 mg total) by mouth every 8 (eight) hours as needed for nausea or vomiting. 90 tablet 2  . ranitidine (ZANTAC) 150 MG tablet Take 1 tablet (150 mg total) by mouth daily. 90  tablet 4  . traMADol (ULTRAM) 50 MG tablet Take 1 tablet (50 mg total) by mouth every 6 (six) hours as needed (pain). 270 tablet 1  . methylPREDNISolone (MEDROL DOSEPAK) 4 MG TBPK tablet Take 4 mg by mouth.    . predniSONE (DELTASONE) 10 MG tablet Take 1 tablet (10 mg total) by mouth daily with breakfast. (Patient not taking: Reported on 10/24/2017) 7 tablet 0   No current facility-administered medications for this encounter.     Physical Findings: The patient is in no acute distress. Patient is alert and oriented.  weight is 99 lb 6 oz (45.1 kg). Her oral temperature is 98.4 F (36.9 C). Her blood pressure is 163/65 (abnormal) and her pulse is 76. Her respiration is 18 and oxygen saturation is 99%. .  Lungs are clear to auscultation bilaterally. Heart has regular rate and rhythm. No palpable cervical, supraclavicular, or axillary adenopathy. Abdomen soft, non-tender, normal bowel sounds.  No wheezing appreciated today  Lab Findings: Lab Results  Component Value Date   WBC 6.8 09/22/2017   HGB 9.2 (L) 09/22/2017   HCT 29.0 (L) 09/22/2017   MCV 90.6 09/22/2017   PLT 321 09/22/2017    Radiographic Findings: No results found.  Impression:  The patient is recovering from the effects of radiation.  She continues to have fatigue and since SBRT is  more shortness of breath.  Patient will be placed on Medrol Dosepak to see if this will help with her respiratory issues.  She will also be set up for CT scan to serve as baseline after her stereotactic body radiation therapy.  Plan: Routine follow-up in 3 months.  Patient may require a longer course of steroids depending on her response to the [place].  She is scheduled for a PET scan later this month which is too early after her stereotactic body radiation therapy.  PET scan should be performed no earlier than 3 months after radiation therapy as  the radiation therapy can cause inflammation in the treatment area and make PET scan interpretation  difficult.  We will see if PET scan can be postponed by medical oncology.  ____________________________________ Gery Pray, MD

## 2017-10-24 NOTE — Telephone Encounter (Signed)
Called in prescription to [facility] for Medrol Dose Pak per Dr. Sondra Come.

## 2017-10-24 NOTE — Progress Notes (Signed)
Tamara Adams is here for her follow-up appointment. Denies any pain .States that she has difficulty with breathing at rest and with activity.Denies any difficulty with swallowing now, states that she was having some difficulty two weeks ago. Denies any coughing. States that her appetite is ok. Denies any skin irritation from radiation. Patient states that she is currently on 8 liters of oxygen. Vitals:   10/24/17 1614  BP: (!) 163/65  Pulse: 76  Resp: 18  Temp: 98.4 F (36.9 C)  TempSrc: Oral  SpO2: 99%  Weight: 99 lb 6 oz (45.1 kg)   Wt Readings from Last 3 Encounters:  10/24/17 99 lb 6 oz (45.1 kg)  09/27/17 93 lb 6.4 oz (42.4 kg)  09/20/17 86 lb 9.6 oz (39.3 kg)

## 2017-10-25 ENCOUNTER — Other Ambulatory Visit: Payer: Self-pay | Admitting: Family

## 2017-10-29 ENCOUNTER — Other Ambulatory Visit: Payer: Self-pay

## 2017-10-29 DIAGNOSIS — J189 Pneumonia, unspecified organism: Secondary | ICD-10-CM | POA: Diagnosis not present

## 2017-10-29 DIAGNOSIS — I10 Essential (primary) hypertension: Secondary | ICD-10-CM | POA: Diagnosis not present

## 2017-10-29 DIAGNOSIS — M5136 Other intervertebral disc degeneration, lumbar region: Secondary | ICD-10-CM

## 2017-10-29 DIAGNOSIS — J440 Chronic obstructive pulmonary disease with acute lower respiratory infection: Secondary | ICD-10-CM | POA: Diagnosis not present

## 2017-10-29 DIAGNOSIS — G894 Chronic pain syndrome: Secondary | ICD-10-CM

## 2017-10-29 DIAGNOSIS — I481 Persistent atrial fibrillation: Secondary | ICD-10-CM | POA: Diagnosis not present

## 2017-10-29 DIAGNOSIS — J441 Chronic obstructive pulmonary disease with (acute) exacerbation: Secondary | ICD-10-CM | POA: Diagnosis not present

## 2017-10-29 DIAGNOSIS — C3490 Malignant neoplasm of unspecified part of unspecified bronchus or lung: Secondary | ICD-10-CM | POA: Diagnosis not present

## 2017-10-29 MED ORDER — HYDROCODONE-ACETAMINOPHEN 10-325 MG PO TABS: 1.0000 | ORAL_TABLET | Freq: Four times a day (QID) | ORAL | 0 refills | Status: DC | PRN

## 2017-10-29 NOTE — Telephone Encounter (Signed)
Tamara Adams requests a refill on hydrocodone.

## 2017-10-30 ENCOUNTER — Other Ambulatory Visit: Payer: Self-pay

## 2017-10-30 ENCOUNTER — Ambulatory Visit (HOSPITAL_BASED_OUTPATIENT_CLINIC_OR_DEPARTMENT_OTHER)
Admission: RE | Admit: 2017-10-30 | Discharge: 2017-10-30 | Disposition: A | Discharge status: Home / Self Care | Payer: Medicare Other | Source: Ambulatory Visit | Attending: Radiation Oncology | Admitting: Radiation Oncology

## 2017-10-30 ENCOUNTER — Telehealth: Payer: Self-pay | Admitting: *Deleted

## 2017-10-30 DIAGNOSIS — I2510 Atherosclerotic heart disease of native coronary artery without angina pectoris: Secondary | ICD-10-CM | POA: Insufficient documentation

## 2017-10-30 DIAGNOSIS — I700 Atherosclerosis of aorta: Secondary | ICD-10-CM | POA: Insufficient documentation

## 2017-10-30 DIAGNOSIS — J984 Other disorders of lung: Secondary | ICD-10-CM | POA: Diagnosis not present

## 2017-10-30 DIAGNOSIS — J439 Emphysema, unspecified: Secondary | ICD-10-CM | POA: Diagnosis not present

## 2017-10-30 DIAGNOSIS — R911 Solitary pulmonary nodule: Secondary | ICD-10-CM | POA: Insufficient documentation

## 2017-10-30 MED ORDER — OMEPRAZOLE 40 MG PO CPDR: 40.0000 mg | DELAYED_RELEASE_CAPSULE | Freq: Two times a day (BID) | ORAL | 3 refills | Status: AC

## 2017-10-30 NOTE — Telephone Encounter (Signed)
CALLED PATIENT TO INFORM OF FU WITH DR. KINARD ON 01/27/18 @ 11:15 AM , LVM FOR A RETURN CALL

## 2017-10-30 NOTE — Telephone Encounter (Signed)
Tamara Adams requests a refill on omeprazole. Never prescribed by Dr Madilyn Fireman.

## 2017-10-31 DIAGNOSIS — J440 Chronic obstructive pulmonary disease with acute lower respiratory infection: Secondary | ICD-10-CM | POA: Diagnosis not present

## 2017-10-31 DIAGNOSIS — J441 Chronic obstructive pulmonary disease with (acute) exacerbation: Secondary | ICD-10-CM | POA: Diagnosis not present

## 2017-10-31 DIAGNOSIS — I481 Persistent atrial fibrillation: Secondary | ICD-10-CM | POA: Diagnosis not present

## 2017-10-31 DIAGNOSIS — J189 Pneumonia, unspecified organism: Secondary | ICD-10-CM | POA: Diagnosis not present

## 2017-10-31 DIAGNOSIS — C3490 Malignant neoplasm of unspecified part of unspecified bronchus or lung: Secondary | ICD-10-CM | POA: Diagnosis not present

## 2017-10-31 DIAGNOSIS — I10 Essential (primary) hypertension: Secondary | ICD-10-CM | POA: Diagnosis not present

## 2017-11-01 DIAGNOSIS — J189 Pneumonia, unspecified organism: Secondary | ICD-10-CM | POA: Diagnosis not present

## 2017-11-01 DIAGNOSIS — J440 Chronic obstructive pulmonary disease with acute lower respiratory infection: Secondary | ICD-10-CM | POA: Diagnosis not present

## 2017-11-01 DIAGNOSIS — J441 Chronic obstructive pulmonary disease with (acute) exacerbation: Secondary | ICD-10-CM | POA: Diagnosis not present

## 2017-11-01 DIAGNOSIS — I481 Persistent atrial fibrillation: Secondary | ICD-10-CM | POA: Diagnosis not present

## 2017-11-01 DIAGNOSIS — I10 Essential (primary) hypertension: Secondary | ICD-10-CM | POA: Diagnosis not present

## 2017-11-01 DIAGNOSIS — C3490 Malignant neoplasm of unspecified part of unspecified bronchus or lung: Secondary | ICD-10-CM | POA: Diagnosis not present

## 2017-11-04 ENCOUNTER — Ambulatory Visit (HOSPITAL_COMMUNITY): Payer: Medicare Other

## 2017-11-05 DIAGNOSIS — C3490 Malignant neoplasm of unspecified part of unspecified bronchus or lung: Secondary | ICD-10-CM | POA: Diagnosis not present

## 2017-11-05 DIAGNOSIS — J189 Pneumonia, unspecified organism: Secondary | ICD-10-CM | POA: Diagnosis not present

## 2017-11-05 DIAGNOSIS — I481 Persistent atrial fibrillation: Secondary | ICD-10-CM | POA: Diagnosis not present

## 2017-11-05 DIAGNOSIS — I10 Essential (primary) hypertension: Secondary | ICD-10-CM | POA: Diagnosis not present

## 2017-11-05 DIAGNOSIS — J441 Chronic obstructive pulmonary disease with (acute) exacerbation: Secondary | ICD-10-CM | POA: Diagnosis not present

## 2017-11-05 DIAGNOSIS — J440 Chronic obstructive pulmonary disease with acute lower respiratory infection: Secondary | ICD-10-CM | POA: Diagnosis not present

## 2017-11-12 ENCOUNTER — Ambulatory Visit (INDEPENDENT_AMBULATORY_CARE_PROVIDER_SITE_OTHER): Payer: Medicare Other | Admitting: Family Medicine

## 2017-11-12 ENCOUNTER — Encounter: Payer: Self-pay | Admitting: Family Medicine

## 2017-11-12 VITALS — BP 164/59 | HR 81 | Ht 64.0 in | Wt 92.0 lb

## 2017-11-12 DIAGNOSIS — R911 Solitary pulmonary nodule: Secondary | ICD-10-CM

## 2017-11-12 DIAGNOSIS — C3490 Malignant neoplasm of unspecified part of unspecified bronchus or lung: Secondary | ICD-10-CM | POA: Diagnosis not present

## 2017-11-12 DIAGNOSIS — J441 Chronic obstructive pulmonary disease with (acute) exacerbation: Secondary | ICD-10-CM | POA: Diagnosis not present

## 2017-11-12 DIAGNOSIS — I481 Persistent atrial fibrillation: Secondary | ICD-10-CM | POA: Diagnosis not present

## 2017-11-12 DIAGNOSIS — J9611 Chronic respiratory failure with hypoxia: Secondary | ICD-10-CM | POA: Diagnosis not present

## 2017-11-12 DIAGNOSIS — I10 Essential (primary) hypertension: Secondary | ICD-10-CM

## 2017-11-12 DIAGNOSIS — E538 Deficiency of other specified B group vitamins: Secondary | ICD-10-CM

## 2017-11-12 DIAGNOSIS — J189 Pneumonia, unspecified organism: Secondary | ICD-10-CM | POA: Diagnosis not present

## 2017-11-12 DIAGNOSIS — E43 Unspecified severe protein-calorie malnutrition: Secondary | ICD-10-CM

## 2017-11-12 DIAGNOSIS — J440 Chronic obstructive pulmonary disease with acute lower respiratory infection: Secondary | ICD-10-CM | POA: Diagnosis not present

## 2017-11-12 MED ORDER — TRAMADOL HCL 50 MG PO TABS: 50.0000 mg | ORAL_TABLET | Freq: Four times a day (QID) | ORAL | 1 refills | Status: DC | PRN

## 2017-11-12 MED ORDER — LOSARTAN POTASSIUM 100 MG PO TABS: 100.0000 mg | ORAL_TABLET | Freq: Every day | ORAL | 1 refills | Status: DC

## 2017-11-12 MED ORDER — CYANOCOBALAMIN 1000 MCG/ML IJ SOLN
1000.0000 ug | Freq: Once | INTRAMUSCULAR | Status: AC
  Administered 2017-11-12: 1000 ug via INTRAMUSCULAR

## 2017-11-12 NOTE — Progress Notes (Signed)
B 12 injection given today. Pt tolerated well, given in LD. She will RTC in 1 month for next injection.Tamara Adams, [place]

## 2017-11-12 NOTE — Progress Notes (Signed)
Subjective:    Patient ID: Tamara Adams, female    DOB: 09-09-1940, 77 y.o.   MRN: [id number]  HPI  77 yo female with lung cancer and COPD.  She is undergone several radiation treatments for her lung cancer with the last being in March.  She does have a follow-up with oncology tomorrow.  Last time I saw her she was recovering from hospital-acquired pneumonia.  Actually scheduled for follow-up PET scan in June.  Severe protein calorie malnutrition-we did increase her Marinol about a month ago.  Chronic Hypoxic respiratory failure-currently wears 8 L of oxygen.  She is concerned because she says that after radiation and after her recent bout of pneumonia she just feels like she is not back to her previous baseline.  She does feel like she is more short short of breath and struggling a little bit more.  Hypertension- Pt denies chest pain, SOB, dizziness, or heart palpitations.  Taking meds as directed w/o problems.  Denies medication side effects.      Review of Systems  BP (!) 164/59   Pulse 81   Ht 5\' 4"  (1.626 m)   Wt 92 lb (41.7 kg)   SpO2 97% Comment: 6L  BMI 15.79 kg/m     Allergies  Allergen Reactions  . Maxidex [Dexamethasone] Other (See Comments)    DEHYDRATION AND HEART RACING  . Advair Diskus [Fluticasone-Salmeterol] Other (See Comments)    No benefit with lungs (INEFFECTIVE)  . Remeron [Mirtazapine] Other (See Comments)    CAUSED NIGHTMARES  . Trazodone And Nefazodone Other (See Comments)    Heart pounding  . Tylox [Oxycodone-Acetaminophen] Itching    Past Medical History:  Diagnosis Date  . Anemia    as a child  . Arthritis    "hands" (12/24/2014)  . Complication of anesthesia    " My blood gas dropped during surgery so I was left on a ventilator and in ICU for 3 days."  . COPD (chronic obstructive pulmonary disease) (HCC)    FVC 72%, FEV1 29%, FEV1 ratio 32% (very severe (COPD)  . COPD (chronic obstructive pulmonary disease) ([facility])   . Degenerative disc  disease   . Depression with anxiety   . Diverticulosis   . DVT (deep venous thrombosis) ([facility])    "LLE; years ago after a surgery"  . Essential hypertension   . Fluttering sensation of heart    pt put on metoprolol as a result  . GERD (gastroesophageal reflux disease)    PMH  . H/O hiatal hernia   . Headache    "maybe weekly" (12/24/2014)  . Hyperlipidemia   . Hypertension   . Kidney stone   . Liver lesion   . MI (myocardial infarction) ([facility]) 07/19/2017  . On home oxygen therapy    "3L; sleep w/it; use it when I rest" (12/24/2014)  . Peptic ulcer   . Pinched nerve    in back  . Pneumonia   . PONV (postoperative nausea and vomiting)   . Shortness of breath dyspnea    with exertion  . Skipped heart beats   . Small bowel obstruction ([facility])    "several times; OR twice" (12/24/2014)  . Tubular adenoma of colon 09/2011  . Wears glasses     Past Surgical History:  Procedure Laterality Date  . ABDOMINAL ADHESION SURGERY  12/24/2014  . ABDOMINAL HYSTERECTOMY  1987   and one ovary  . CATARACT EXTRACTION W/ INTRAOCULAR LENS  IMPLANT, BILATERAL Right 06/22/2012   Dr. Lester Kinsman.   Marland Kitchen  CATARACT EXTRACTION W/PHACO Left 06/12/2012   Dr. Boykin Reaper  . CHOLECYSTECTOMY N/A 08/18/2013   Procedure: LAPAROSCOPIC CHOLECYSTECTOMY;  Surgeon: Harl Bowie, MD;  Location: [facility];  Service: General;  Laterality: N/A;  . DILATION AND CURETTAGE OF UTERUS    . EXPLORATORY LAPAROTOMY  12/24/2014  . HERNIA REPAIR    . INCISIONAL HERNIA REPAIR  12/24/2014  . INCISIONAL HERNIA REPAIR N/A 12/24/2014   Procedure: HERNIA REPAIR INCISIONAL;  Surgeon: Coralie Keens, MD;  Location: [facility];  Service: General;  Laterality: N/A;  . LAPAROTOMY N/A 12/24/2014   Procedure: EXPLORATORY LAPAROTOMY;  Surgeon: Coralie Keens, MD;  Location: [facility];  Service: General;  Laterality: N/A;  . LYSIS OF ADHESION N/A 12/24/2014   Procedure: LYSIS OF ADHESION;  Surgeon: Coralie Keens, MD;  Location: [facility];  Service: General;   Laterality: N/A;  . MULTIPLE TOOTH EXTRACTIONS    . OOPHORECTOMY  1992  . SMALL INTESTINE SURGERY     for a blockage, no bowel was removed, just kinked from scar tissue  . TUBAL LIGATION    . URETHRAL DILATION      Social History   Socioeconomic History  . Marital status: Married    Spouse name: Not on file  . Number of children: 3  . Years of education: Not on file  . Highest education level: Not on file  Occupational History  . Occupation: retired.    Social Needs  . Financial resource strain: Not on file  . Food insecurity:    Worry: Not on file    Inability: Not on file  . Transportation needs:    Medical: Not on file    Non-medical: Not on file  Tobacco Use  . Smoking status: Former Smoker    Packs/day: 0.25    Years: 57.00    Pack years: 14.25    Types: Cigarettes    Last attempt to quit: 07/25/2015    Years since quitting: 2.3  . Smokeless tobacco: Never Used  . Tobacco comment: 5 cigarettes a day  Substance and Sexual Activity  . Alcohol use: No    Alcohol/week: 0.0 oz  . Drug use: No  . Sexual activity: Never  Lifestyle  . Physical activity:    Days per week: Not on file    Minutes per session: Not on file  . Stress: Not on file  Relationships  . Social connections:    Talks on phone: Not on file    Gets together: Not on file    Attends religious service: Not on file    Active member of club or organization: Not on file    Attends meetings of clubs or organizations: Not on file    Relationship status: Not on file  . Intimate partner violence:    Fear of current or ex partner: Not on file    Emotionally abused: Not on file    Physically abused: Not on file    Forced sexual activity: Not on file  Other Topics Concern  . Not on file  Social History Narrative   No regular exercise.     Family History  Problem Relation Age of Onset  . Hypertension Mother   . Ovarian cancer Mother   . Glaucoma Mother   . Glaucoma Sister   . Pancreatic cancer  Maternal Uncle   . Colon cancer Neg Hx   . Colon polyps Neg Hx   . Diabetes Neg Hx   . Kidney disease Neg Hx   .  Gallbladder disease Neg Hx   . Esophageal cancer Neg Hx     Outpatient Encounter Medications as of 11/12/2017  Medication Sig  . acetaminophen (TYLENOL) 325 MG tablet Take 325-650 mg by mouth every 6 (six) hours as needed for headache.  . albuterol (PROVENTIL) (2.5 MG/3ML) 0.083% nebulizer solution USE 1 VIAL (3 ML) VIA NEBULIZER EVERY 6 HOURS AS NEEDED FOR WHEEZING OR SHORTNESS OF BREATH  . AMBULATORY NON FORMULARY MEDICATION Medication Name: Needs oxygen concentrator set to 6 liter per minutes as she has a long tubing on her machine.  Fax to [facility] (Patient taking differently: Medication Name: Needs oxygen concentrator set to 8 liter per minutes as she has a long tubing on her machine.  Fax to [employer])  . AMBULATORY NON FORMULARY MEDICATION Medication Name: GI cocktail: 161mL Lidocaine2%, 170ml Maalox, 52ml Donnatol. Ok to take twice daily.  Marland Kitchen BREO ELLIPTA 100-25 MCG/INH AEPB USE 1 INHALATION DAILY  . clopidogrel (PLAVIX) 75 MG tablet Take 1 tablet by mouth daily  . docusate sodium (COLACE) 100 MG capsule Take 1 capsule (100 mg total) by mouth daily.  Marland Kitchen dronabinol (MARINOL) 5 MG capsule Take 1 capsule (5 mg total) by mouth 2 (two) times daily.  Marland Kitchen guaiFENesin (MUCINEX) 600 MG 12 hr tablet Take 1 tablet (600 mg total) by mouth 2 (two) times daily.  Marland Kitchen HYDROcodone-acetaminophen (NORCO) 10-325 MG tablet Take 1 tablet by mouth every 6 (six) hours as needed.  . INCRUSE ELLIPTA 62.5 MCG/INH AEPB USE 1 INHALATION DAILY  . lidocaine (XYLOCAINE) 2 % solution Use as directed 20 mLs in the mouth or throat 2 (two) times daily as needed.  Marland Kitchen losartan (COZAAR) 100 MG tablet Take 1 tablet (100 mg total) by mouth daily.  . metoprolol tartrate (LOPRESSOR) 50 MG tablet Take 1 tablet (50 mg total) by mouth 2 (two) times daily.  . Multiple Vitamin (MULTIVITAMIN WITH MINERALS) TABS tablet Take 1 tablet by  mouth daily. Pt uses One-A-Day brand  . Nutritional Supplements (FEEDING SUPPLEMENT, BOOST BREEZE,) LIQD Take 1 Can by mouth 3 (three) times daily.  Marland Kitchen omeprazole (PRILOSEC) 40 MG capsule Take 1 capsule (40 mg total) by mouth 2 (two) times daily.  . ondansetron (ZOFRAN-ODT) 8 MG disintegrating tablet PLACE 1 TABLET ON THE TONGUE EVERY 8 HOURS AS NEEDED FOR NAUSEA  . polyethylene glycol (MIRALAX / GLYCOLAX) packet Take 17 g by mouth daily.  . promethazine (PHENERGAN) 25 MG tablet Take 1 tablet (25 mg total) by mouth every 8 (eight) hours as needed for nausea or vomiting.  . ranitidine (ZANTAC) 150 MG tablet Take 1 tablet (150 mg total) by mouth daily.  . traMADol (ULTRAM) 50 MG tablet Take 1 tablet (50 mg total) by mouth every 6 (six) hours as needed (pain).  . [DISCONTINUED] losartan (COZAAR) 100 MG tablet Take 100 mg by mouth daily.  . [DISCONTINUED] traMADol (ULTRAM) 50 MG tablet Take 1 tablet (50 mg total) by mouth every 6 (six) hours as needed (pain).  . [DISCONTINUED] losartan (COZAAR) 50 MG tablet Take 1 tablet (50 mg total) by mouth daily.  . [DISCONTINUED] methylPREDNISolone (MEDROL DOSEPAK) 4 MG TBPK tablet Take 4 mg by mouth.  . [DISCONTINUED] predniSONE (DELTASONE) 10 MG tablet Take 1 tablet (10 mg total) by mouth daily with breakfast. (Patient not taking: Reported on 10/24/2017)  . [EXPIRED] cyanocobalamin ((VITAMIN B-12)) injection 1,000 mcg    No facility-administered encounter medications on file as of 11/12/2017.          Objective:  Physical Exam  Constitutional: She is oriented to person, place, and time. She appears well-developed and well-nourished.  HENT:  Head: Normocephalic and atraumatic.  Cardiovascular: Normal rate, regular rhythm and normal heart sounds.  Pulmonary/Chest: Effort normal. She has wheezes.  Diffuse expiratory wheezing.   Neurological: She is alert and oriented to person, place, and time.  Skin: Skin is warm and dry.  Psychiatric: She has a normal  mood and affect. Her behavior is normal.       Assessment & Plan:  Lung CA -she did complete her last radiation treatment and just had a follow-up CT.  She is scheduled for a PET scan in June.  She has a follow-up coming up with oncology believe tomorrow.  Severe protein calorie malnutrition -she is on higher strength Marinol but says she really does not notice a big difference between the lower strength in her current one.  We will just continue current regimen for now.  Chronic Hypoxic respiratory failure -it certainly look at some options such as Daliresp or may be even putting her on low-dose prednisone.  Overall for her the most important thing is quality of life and so that would make her feel better and breathe better I think it is important for [place] to consider that.  She is already on triple inhaler therapy with Memory [facility] and Incruse.  As well as on chronic oxygen therapy at 8 L.  HTN - Well controlled. Continue current regimen. Follow up in  3 months.    End-of-life care-we did discuss her considering the possibility of DNR.  We also discussed getting her a copy of the forms in the paperwork.  She has been tried to have some open conversations with her family but they have been fairly resistant and do not really seem to want to discuss it.  I just encouraged her to continue to push that issue with them as it really does need to be an open discussion with her family.  Her son Nicki Reaper is her healthcare power of attorney.  She does feel like she is had some good discussions with her granddaughter.

## 2017-11-13 ENCOUNTER — Inpatient Hospital Stay: Payer: Medicare Other | Attending: Family | Admitting: Family

## 2017-11-13 ENCOUNTER — Inpatient Hospital Stay: Payer: Medicare Other

## 2017-11-13 VITALS — BP 156/66 | HR 70 | Resp 18 | Wt 90.0 lb

## 2017-11-13 DIAGNOSIS — Z923 Personal history of irradiation: Secondary | ICD-10-CM | POA: Insufficient documentation

## 2017-11-13 DIAGNOSIS — R0602 Shortness of breath: Secondary | ICD-10-CM | POA: Diagnosis not present

## 2017-11-13 DIAGNOSIS — R911 Solitary pulmonary nodule: Secondary | ICD-10-CM | POA: Insufficient documentation

## 2017-11-13 DIAGNOSIS — Z7982 Long term (current) use of aspirin: Secondary | ICD-10-CM

## 2017-11-13 DIAGNOSIS — R5383 Other fatigue: Secondary | ICD-10-CM | POA: Insufficient documentation

## 2017-11-13 DIAGNOSIS — R110 Nausea: Secondary | ICD-10-CM | POA: Diagnosis not present

## 2017-11-13 DIAGNOSIS — Z86718 Personal history of other venous thrombosis and embolism: Secondary | ICD-10-CM | POA: Insufficient documentation

## 2017-11-13 DIAGNOSIS — K5900 Constipation, unspecified: Secondary | ICD-10-CM | POA: Diagnosis not present

## 2017-11-13 DIAGNOSIS — C3490 Malignant neoplasm of unspecified part of unspecified bronchus or lung: Secondary | ICD-10-CM

## 2017-11-13 DIAGNOSIS — D508 Other iron deficiency anemias: Secondary | ICD-10-CM

## 2017-11-13 DIAGNOSIS — R5382 Chronic fatigue, unspecified: Secondary | ICD-10-CM

## 2017-11-13 DIAGNOSIS — D509 Iron deficiency anemia, unspecified: Secondary | ICD-10-CM | POA: Diagnosis not present

## 2017-11-13 DIAGNOSIS — R531 Weakness: Secondary | ICD-10-CM | POA: Diagnosis not present

## 2017-11-13 DIAGNOSIS — Z79899 Other long term (current) drug therapy: Secondary | ICD-10-CM | POA: Diagnosis not present

## 2017-11-13 LAB — CBC WITH DIFFERENTIAL (CANCER CENTER ONLY)
BASOS ABS: 0.1 10*3/uL (ref 0.0–0.1)
BASOS PCT: 1 %
EOS PCT: 54 %
Eosinophils Absolute: 7.6 10*3/uL — ABNORMAL HIGH (ref 0.0–0.5)
HCT: 34 % — ABNORMAL LOW (ref 34.8–46.6)
Hemoglobin: 11.2 g/dL — ABNORMAL LOW (ref 11.6–15.9)
Lymphocytes Relative: 10 %
Lymphs Abs: 1.5 10*3/uL (ref 0.9–3.3)
MCH: 29.9 pg (ref 26.0–34.0)
MCHC: 32.9 g/dL (ref 32.0–36.0)
MCV: 90.7 fL (ref 81.0–101.0)
Monocytes Absolute: 0.9 10*3/uL (ref 0.1–0.9)
Monocytes Relative: 6 %
Neutro Abs: 4.2 10*3/uL (ref 1.5–6.5)
Neutrophils Relative %: 29 %
PLATELETS: 382 10*3/uL (ref 145–400)
RBC: 3.75 MIL/uL (ref 3.70–5.32)
RDW: 14.9 % (ref 11.1–15.7)
WBC Count: 14.2 10*3/uL — ABNORMAL HIGH (ref 3.9–10.0)

## 2017-11-13 LAB — CMP (CANCER CENTER ONLY)
ALBUMIN: 3.4 g/dL — AB (ref 3.5–5.0)
ALK PHOS: 72 U/L (ref 40–150)
ALT: 11 U/L (ref 0–55)
AST: 18 U/L (ref 5–34)
Anion gap: 12 — ABNORMAL HIGH (ref 3–11)
BUN: 7 mg/dL (ref 7–26)
CALCIUM: 9 mg/dL (ref 8.4–10.4)
CO2: 27 mmol/L (ref 22–29)
Chloride: 95 mmol/L — ABNORMAL LOW (ref 98–109)
Creatinine: 0.74 mg/dL (ref 0.60–1.10)
GFR, Est AFR Am: >60 mL/min (ref >60)
GFR, Estimated: >60 mL/min (ref >60)
GLUCOSE: 121 mg/dL (ref 70–140)
Potassium: 4 mmol/L (ref 3.5–5.1)
Sodium: 134 mmol/L — ABNORMAL LOW (ref 136–145)
TOTAL PROTEIN: 6.7 g/dL (ref 6.4–8.3)
Total Bilirubin: <0.2 mg/dL — ABNORMAL LOW (ref 0.2–1.2)

## 2017-11-13 MED ORDER — PREDNISONE 10 MG PO TABS: 10.0000 mg | ORAL_TABLET | Freq: Every day | ORAL | 3 refills | Status: DC

## 2017-11-13 NOTE — Progress Notes (Signed)
Hematology and Oncology Follow Up Visit  Tamara Adams [id number] 23-Jul-1941 77 y.o. 11/13/2017   Principle Diagnosis:  Iron deficeincy anemia  Nodule in the left lung apex  History of DVT of the left lower extremity  Current Therapy:   SBRT s/p 6 fractions completed    Interim History:  Ms. Tamara Adams is here today with her daughter for follow-up. She is feeling quite fatigued, weak and her SOB seems to be more prominent. She is on 6-8L [place] of supportive oxygen 24 hours a day.  Her CT scan on 4/10 showed stable to slightly decreased 2.5 cm left upper lobe pulmonary nodule and no evidence of metastatic disease in the chest. She is scheduled for a PET scan in June.  She has had no fever, chills, n/v, cough, rash, dizziness, chest pain, palpitations, abdominal pain or changes in bowel or bladder habits.  She has chronic constipation and is taking Miralax twice a day as well as colace and magnesium as needed.  She has nausea as well and will alternate phenergan, zofran and GI cocktail as needed.  Her appetite comes and goes. He is staying well hydrated. Her weight is stable at 90 lbs since her last visit.  She verbalized that she is taking her Marinol daily as prescribed.  No swelling, tenderness, numbness or tingling in her extremities. No c/o pain.  No falls or syncopal episodes.  She has had no episodes of bleeding. She does bruise easily on Plavix.  No lymphadenopathy noted on exam.   ECOG Performance Status: 1 - Symptomatic but completely ambulatory  Medications:  Allergies as of 11/13/2017      Reactions   Maxidex [dexamethasone] Other (See Comments)   DEHYDRATION AND HEART RACING   Advair Diskus [fluticasone-salmeterol] Other (See Comments)   No benefit with lungs (INEFFECTIVE)   Remeron [mirtazapine] Other (See Comments)   CAUSED NIGHTMARES   Trazodone And Nefazodone Other (See Comments)   Heart pounding   Tylox [oxycodone-acetaminophen] Itching      Medication List        Accurate as of 11/13/17  1:47 PM. Always use your most recent med list.          acetaminophen 325 MG tablet Commonly known as:  TYLENOL Take 325-650 mg by mouth every 6 (six) hours as needed for headache.   albuterol (2.5 MG/3ML) 0.083% nebulizer solution Commonly known as:  PROVENTIL USE 1 VIAL (3 ML) VIA NEBULIZER EVERY 6 HOURS AS NEEDED FOR WHEEZING OR SHORTNESS OF BREATH   AMBULATORY NON FORMULARY MEDICATION Medication Name: Needs oxygen concentrator set to 6 liter per minutes as she has a long tubing on her machine.  Fax to [facility] Medication Name: GI cocktail: 129mL Lidocaine2%, 139ml Maalox, 14ml Donnatol. Ok to take twice daily.   BREO ELLIPTA 100-25 MCG/INH Aepb Generic drug:  fluticasone furoate-vilanterol USE 1 INHALATION DAILY   clopidogrel 75 MG tablet Commonly known as:  PLAVIX Take 1 tablet by mouth daily   docusate sodium 100 MG capsule Commonly known as:  COLACE Take 1 capsule (100 mg total) by mouth daily.   dronabinol 5 MG capsule Commonly known as:  MARINOL Take 1 capsule (5 mg total) by mouth 2 (two) times daily.   feeding supplement (BOOST BREEZE) Liqd Take 1 Can by mouth 3 (three) times daily.   guaiFENesin 600 MG 12 hr tablet Commonly known as:  MUCINEX Take 1 tablet (600 mg total) by mouth 2 (two) times daily.   HYDROcodone-acetaminophen  10-325 MG tablet Commonly known as:  NORCO Take 1 tablet by mouth every 6 (six) hours as needed.   INCRUSE ELLIPTA 62.5 MCG/INH Aepb Generic drug:  umeclidinium bromide USE 1 INHALATION DAILY   lidocaine 2 % solution Commonly known as:  XYLOCAINE Use as directed 20 mLs in the mouth or throat 2 (two) times daily as needed.   losartan 100 MG tablet Commonly known as:  COZAAR Take 1 tablet (100 mg total) by mouth daily.   metoprolol tartrate 50 MG tablet Commonly known as:  LOPRESSOR Take 1 tablet (50 mg total) by mouth 2 (two) times daily.   multivitamin with  minerals Tabs tablet Take 1 tablet by mouth daily. Pt uses One-A-Day brand   omeprazole 40 MG capsule Commonly known as:  PRILOSEC Take 1 capsule (40 mg total) by mouth 2 (two) times daily.   ondansetron 8 MG disintegrating tablet Commonly known as:  ZOFRAN-ODT PLACE 1 TABLET ON THE TONGUE EVERY 8 HOURS AS NEEDED FOR NAUSEA   polyethylene glycol packet Commonly known as:  MIRALAX / GLYCOLAX Take 17 g by mouth daily.   promethazine 25 MG tablet Commonly known as:  PHENERGAN Take 1 tablet (25 mg total) by mouth every 8 (eight) hours as needed for nausea or vomiting.   ranitidine 150 MG tablet Commonly known as:  ZANTAC Take 1 tablet (150 mg total) by mouth daily.   traMADol 50 MG tablet Commonly known as:  ULTRAM Take 1 tablet (50 mg total) by mouth every 6 (six) hours as needed (pain).       Allergies:  Allergies  Allergen Reactions  . Maxidex [Dexamethasone] Other (See Comments)    DEHYDRATION AND HEART RACING  . Advair Diskus [Fluticasone-Salmeterol] Other (See Comments)    No benefit with lungs (INEFFECTIVE)  . Remeron [Mirtazapine] Other (See Comments)    CAUSED NIGHTMARES  . Trazodone And Nefazodone Other (See Comments)    Heart pounding  . Tylox [Oxycodone-Acetaminophen] Itching    Past Medical History, Surgical history, Social history, and Family History were reviewed and updated.  Review of Systems: All other 10 point review of systems is negative.   Physical Exam:  vitals were not taken for this visit.   Wt Readings from Last 3 Encounters:  11/12/17 92 lb (41.7 kg)  10/24/17 99 lb 6 oz (45.1 kg)  09/27/17 93 lb 6.4 oz (42.4 kg)    Ocular: Sclerae unicteric, pupils equal, round and reactive to light Ear-nose-throat: Oropharynx clear, dentition fair Lymphatic: No cervical, supraclavicular or axillary adenopathy Lungs no rales or rhonchi, good excursion bilaterally Heart regular rate and rhythm, no murmur appreciated Abd soft, nontender, positive  bowel sounds, no liver or spleen tip palpated on exam, no fluid wave  MSK no focal spinal tenderness, no joint edema Neuro: non-focal, well-oriented, appropriate affect Breasts: Deferred   Lab Results  Component Value Date   WBC 6.8 09/22/2017   HGB 9.2 (L) 09/22/2017   HCT 29.0 (L) 09/22/2017   MCV 90.6 09/22/2017   PLT 321 09/22/2017   Lab Results  Component Value Date   FERRITIN 893 (H) 09/18/2017   IRON 186 (H) 09/18/2017   TIBC 231 (L) 09/18/2017   UIBC 46 09/18/2017   IRONPCTSAT 80 (H) 09/18/2017   Lab Results  Component Value Date   RETICCTPCT 2.0 09/22/2017   RBC 3.20 (L) 09/22/2017   RBC 3.20 (L) 09/22/2017   No results found for: KPAFRELGTCHN, LAMBDASER, KAPLAMBRATIO No results found for: IGGSERUM, IGA, IGMSERUM No results found  for: Odetta Pink, SPEI   Chemistry      Component Value Date/Time   NA 132 (L) 09/22/2017 0505   K 4.7 09/22/2017 0505   CL 94 (L) 09/22/2017 0505   CO2 31 09/22/2017 0505   BUN 21 (H) 09/22/2017 0505   CREATININE 0.75 09/22/2017 0505   CREATININE 0.78 09/18/2017 1324   CREATININE 0.72 08/05/2017 1440      Component Value Date/Time   CALCIUM 8.9 09/22/2017 0505   ALKPHOS 74 09/20/2017 0758   AST 26 09/20/2017 0758   AST 24 09/18/2017 1324   ALT 15 09/20/2017 0758   ALT 18 09/18/2017 1324   BILITOT 0.2 (L) 09/20/2017 0758   BILITOT 0.2 09/18/2017 1324      Impression and Plan: Ms. Schoeller is a very pleasant 77 yo caucasian female with history of iron deficiency and DVT of the left lower extremity. Her issue at this time is 2.5 cm nodule in the left lung apex. She tolerated radiation well but is having some fatigue, weakness and increased SOB with activity.  CT scan 2 weeks ago showed the nodule to be stable to slightly decreased and no evidence of metastatic disease in the chest. We will get a PET scan in June and plan to see her back in another 2 months for follow-up.  We  will have her try Prednisone 10 mg PO daily for help with fatigue, weakness, SOB and decreased appetite.  They will contact our office with any questions or concerns. We can certainly see her sooner if need be.   Laverna Peace, NP 4/24/20191:47 PM

## 2017-11-14 ENCOUNTER — Telehealth: Payer: Self-pay | Admitting: Family Medicine

## 2017-11-14 LAB — IRON AND TIBC
IRON: 114 ug/dL (ref 41–142)
SATURATION RATIOS: 64 % — AB (ref 21–57)
TIBC: 178 ug/dL — ABNORMAL LOW (ref 236–444)
UIBC: 64 ug/dL

## 2017-11-14 LAB — FERRITIN: Ferritin: 316 ng/mL — ABNORMAL HIGH (ref 9–269)

## 2017-11-14 NOTE — Telephone Encounter (Signed)
Call pt: I check on the Daliresp it can cause nausea, weight loss and diarrhea so I don't want to use it.I think the prednisone is best idea. I see she was started on it yesterday.

## 2017-11-15 NOTE — Telephone Encounter (Signed)
Pt advised. Verbalized understanding. No further questions.  

## 2017-11-20 ENCOUNTER — Other Ambulatory Visit: Payer: Self-pay

## 2017-11-20 DIAGNOSIS — R110 Nausea: Secondary | ICD-10-CM

## 2017-11-20 DIAGNOSIS — R5382 Chronic fatigue, unspecified: Secondary | ICD-10-CM

## 2017-11-20 DIAGNOSIS — C3490 Malignant neoplasm of unspecified part of unspecified bronchus or lung: Secondary | ICD-10-CM

## 2017-11-20 MED ORDER — DRONABINOL 5 MG PO CAPS: 5.0000 mg | ORAL_CAPSULE | Freq: Two times a day (BID) | ORAL | 1 refills | Status: DC

## 2017-11-20 MED ORDER — PREDNISONE 10 MG PO TABS: 10.0000 mg | ORAL_TABLET | Freq: Every day | ORAL | 0 refills | Status: DC

## 2017-11-20 MED ORDER — DRONABINOL 5 MG PO CAPS: 5.0000 mg | ORAL_CAPSULE | Freq: Two times a day (BID) | ORAL | 0 refills | Status: DC

## 2017-11-20 NOTE — Telephone Encounter (Signed)
Dorian called for refill on Marinol. I printed it twice by mistake before I realized it has to be signed by a provider. It needs to go to [facility].   She would also like a refill on prednisone. It has never been prescribed by Dr Madilyn Fireman. Please advise.

## 2017-12-04 ENCOUNTER — Telehealth: Payer: Self-pay | Admitting: Emergency Medicine

## 2017-12-05 ENCOUNTER — Encounter: Payer: Self-pay | Admitting: Physician Assistant

## 2017-12-05 ENCOUNTER — Ambulatory Visit (INDEPENDENT_AMBULATORY_CARE_PROVIDER_SITE_OTHER): Payer: Medicare Other | Admitting: Physician Assistant

## 2017-12-05 VITALS — BP 176/74 | HR 75 | Temp 98.4°F | Resp 14

## 2017-12-05 DIAGNOSIS — J9621 Acute and chronic respiratory failure with hypoxia: Secondary | ICD-10-CM

## 2017-12-05 DIAGNOSIS — E43 Unspecified severe protein-calorie malnutrition: Secondary | ICD-10-CM

## 2017-12-05 DIAGNOSIS — E538 Deficiency of other specified B group vitamins: Secondary | ICD-10-CM

## 2017-12-05 DIAGNOSIS — J441 Chronic obstructive pulmonary disease with (acute) exacerbation: Secondary | ICD-10-CM

## 2017-12-05 MED ORDER — DOXYCYCLINE HYCLATE 100 MG PO TABS: 100.0000 mg | ORAL_TABLET | Freq: Two times a day (BID) | ORAL | 0 refills | Status: DC

## 2017-12-05 MED ORDER — CYANOCOBALAMIN 1000 MCG/ML IJ SOLN
1000.0000 ug | Freq: Once | INTRAMUSCULAR | Status: AC
  Administered 2017-12-05: 1000 ug via INTRAMUSCULAR

## 2017-12-05 MED ORDER — PREDNISONE 20 MG PO TABS: 40.0000 mg | ORAL_TABLET | Freq: Every day | ORAL | 0 refills | Status: AC

## 2017-12-05 MED ORDER — METHYLPREDNISOLONE SODIUM SUCC 125 MG IJ SOLR
125.0000 mg | Freq: Once | INTRAMUSCULAR | Status: AC
  Administered 2017-12-05: 125 mg via INTRAMUSCULAR

## 2017-12-05 NOTE — Patient Instructions (Addendum)
Today:  stop your Prednisone 10 mg,  start Doxycycline 100 mg twice a day for 1 week, increase nebulizer treatments to every 4 hours  Tomorrow:  continue to hold your regular Prednisone and start the higher dose Prednisone 40 mg daily for 5 days Once you finish the new steroid, return to your Prednisone 10mg  dose daily    Acute Respiratory Failure, Adult Acute respiratory failure occurs when there is not enough oxygen passing from your lungs to your body. When this happens, your lungs have trouble removing carbon dioxide from the blood. This causes your blood oxygen level to drop too low as carbon dioxide builds up. Acute respiratory failure is a medical emergency. It can develop quickly, but it is temporary if treated promptly. Your lung capacity, or how much air your lungs can hold, may improve with time, exercise, and treatment. What are the causes? There are many possible causes of acute respiratory failure, including:  Lung injury.  Chest injury or damage to the ribs or tissues near the lungs.  Lung conditions that affect the flow of air and blood into and out of the lungs, such as pneumonia, acute respiratory distress syndrome, and cystic fibrosis.  Medical conditions, such as strokes or spinal cord injuries, that affect the muscles and nerves that control breathing.  Blood infection (sepsis).  Inflammation of the pancreas (pancreatitis).  A blood clot in the lungs (pulmonary embolism).  A large-volume blood transfusion.  Burns.  Near-drowning.  Seizure.  Smoke inhalation.  Reaction to medicines.  Alcohol or drug overdose.  What increases the risk? This condition is more likely to develop in people who have:  A blocked airway.  Asthma.  A condition or disease that damages or weakens the muscles, nerves, bones, or tissues that are involved in breathing.  A serious infection.  A health problem that blocks the unconscious reflex that is involved in  breathing, such as hypothyroidism or sleep apnea.  A lung injury or trauma.  What are the signs or symptoms? Trouble breathing is the main symptom of acute respiratory failure. Symptoms may also include:  Rapid breathing.  Restlessness or anxiety.  Skin, lips, or fingernails that appear blue (cyanosis).  Rapid heart rate.  Abnormal heart rhythms (arrhythmias).  Confusion or changes in behavior.  Tiredness or loss of energy.  Feeling sleepy or having a loss of consciousness.  How is this diagnosed? Your health care provider can diagnose acute respiratory failure with a medical history and physical exam. During the exam, your health care provider will listen to your heart and check for crackling or wheezing sounds in your lungs. Your may also have tests to confirm the diagnosis and determine what is causing respiratory failure. These tests may include:  Measuring the amount of oxygen in your blood (pulse oximetry). The measurement comes from a small device that is placed on your finger, earlobe, or toe.  Other blood tests to measure blood gases and to look for signs of infection.  Sampling your cerebral spinal fluid or tracheal fluid to check for infections.  Chest X-ray to look for fluid in spaces that should be filled with air.  Electrocardiogram (ECG) to look at the heart's electrical activity.  How is this treated? Treatment for this condition usually takes places in a hospital intensive care unit (ICU). Treatment depends on what is causing the condition. It may include one or more treatments until your symptoms improve. Treatment may include:  Supplemental oxygen. Extra oxygen is given through a tube in  the nose, a face mask, or a hood.  A device such as a continuous positive airway pressure (CPAP) or bi-level positive airway pressure (BiPAP or BPAP) machine. This treatment uses mild air pressure to keep the airways open. A mask or other device will be placed over your  nose or mouth. A tube that is connected to a motor will deliver oxygen through the mask.  Ventilator. This treatment helps move air into and out of the lungs. This may be done with a bag and mask or a machine. For this treatment, a tube is placed in your windpipe (trachea) so air and oxygen can flow to the lungs.  Extracorporeal membrane oxygenation (ECMO). This treatment temporarily takes over the function of the heart and lungs, supplying oxygen and removing carbon dioxide. ECMO gives the lungs a chance to recover. It may be used if a ventilator is not effective.  Tracheostomy. This is a procedure that creates a hole in the neck to insert a breathing tube.  Receiving fluids and medicines.  Rocking the bed to help breathing.  Follow these instructions at home:  Take over-the-counter and prescription medicines only as told by your health care provider.  Return to normal activities as told by your health care provider. Ask your health care provider what activities are safe for you.  Keep all follow-up visits as told by your health care provider. This is important. How is this prevented? Treating infections and medical conditions that may lead to acute respiratory failure can help prevent the condition from developing. Contact a health care provider if:  You have a fever.  Your symptoms do not improve or they get worse. Get help right away if:  You are having trouble breathing.  You lose consciousness.  Your have cyanosis or turn blue.  You develop a rapid heart rate.  You are confused. These symptoms may represent a serious problem that is an emergency. Do not wait to see if the symptoms will go away. Get medical help right away. Call your local emergency services (911 in the [place] Do not drive yourself to the hospital. This information is not intended to replace advice given to you by your health care provider. Make sure you discuss any questions you have with your health care  provider. Document Released: 07/14/2013 Document Revised: 02/04/2016 Document Reviewed: 01/25/2016 Elsevier Interactive Patient Education  [employer].

## 2017-12-05 NOTE — Progress Notes (Signed)
HPI:                                                                Tamara Adams is a 77 y.o. 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 Cough is productive of purulent sputum. Reports fever at home of 101-104. Last documented fever was last night. She has been taking Ibuprofen 600 mg every 8 hours and this has been controlling fever and chills. She is on 8L [place] of supportive oxygen 24 hours a day. Has not needed to increase her oxygen requirement. Using nebulizer 3 times / day compared to once daily when she is not sick.  Denies chest pain, hemoptysis, shortness of breath.  Past Medical History:  Diagnosis Date  . Anemia    as a child  . Arthritis    "hands" (12/24/2014)  . Complication of anesthesia    " My blood gas dropped during surgery so I was left on a ventilator and in ICU for 3 days."  . COPD (chronic obstructive pulmonary disease) (HCC)    FVC 72%, FEV1 29%, FEV1 ratio 32% (very severe (COPD)  . COPD (chronic obstructive pulmonary disease) ([place])   . Degenerative disc disease   . Depression with anxiety   . Diverticulosis   . DVT (deep venous thrombosis) ([facility])    "LLE; years ago after a surgery"  . Essential hypertension   . Fluttering sensation of heart    pt put on metoprolol as a result  . GERD (gastroesophageal reflux disease)    PMH  . H/O hiatal hernia   . Headache    "maybe weekly" (12/24/2014)  . Hyperlipidemia   . Hypertension   . Kidney stone   . Liver lesion   . MI (myocardial infarction) ([facility]) 07/19/2017  . On home oxygen therapy    "3L; sleep w/it; use it when I rest" (12/24/2014)  . Peptic ulcer   . Pinched nerve    in back   . Pneumonia   . PONV (postoperative nausea and vomiting)   . Shortness of breath dyspnea    with exertion  . Skipped heart beats   . Small bowel obstruction ([place] [facility])    "several times; OR twice" (12/24/2014)  . Tubular adenoma of colon 09/2011  . Wears glasses    Past Surgical History:  Procedure Laterality Date  . ABDOMINAL ADHESION SURGERY  12/24/2014  . ABDOMINAL HYSTERECTOMY  1987   and one ovary  . CATARACT EXTRACTION W/ INTRAOCULAR LENS  IMPLANT, BILATERAL Right 06/22/2012   Dr. Lester Kinsman.   Marland Kitchen CATARACT EXTRACTION W/PHACO Left 06/12/2012   Dr. Boykin Reaper  . CHOLECYSTECTOMY N/A 08/18/2013   Procedure: LAPAROSCOPIC CHOLECYSTECTOMY;  Surgeon: Harl Bowie, MD;  Location: [facility];  Service: General;  Laterality: N/A;  . DILATION AND CURETTAGE OF UTERUS    . EXPLORATORY LAPAROTOMY  12/24/2014  . HERNIA REPAIR    . INCISIONAL HERNIA REPAIR  12/24/2014  . INCISIONAL  HERNIA REPAIR N/A 12/24/2014   Procedure: HERNIA REPAIR INCISIONAL;  Surgeon: Coralie Keens, MD;  Location: [facility];  Service: General;  Laterality: N/A;  . LAPAROTOMY N/A 12/24/2014   Procedure: EXPLORATORY LAPAROTOMY;  Surgeon: Coralie Keens, MD;  Location: [facility];  Service: General;  Laterality: N/A;  . LYSIS OF ADHESION N/A 12/24/2014   Procedure: LYSIS OF ADHESION;  Surgeon: Coralie Keens, MD;  Location: [facility];  Service: General;  Laterality: N/A;  . MULTIPLE TOOTH EXTRACTIONS    . OOPHORECTOMY  1992  . SMALL INTESTINE SURGERY     for a blockage, no bowel was removed, just kinked from scar tissue  . TUBAL LIGATION    . URETHRAL DILATION     Social History   Tobacco Use  . Smoking status: Former Smoker    Packs/day: 0.25    Years: 57.00    Pack years: 14.25    Types: Cigarettes    Last attempt to quit: 07/25/2015    Years since quitting: 2.3  . Smokeless tobacco: Never Used  . Tobacco comment: 5 cigarettes a day  Substance Use Topics  . Alcohol use: No    Alcohol/week: 0.0 oz   family history includes  Glaucoma in her mother and sister; Hypertension in her mother; Ovarian cancer in her mother; Pancreatic cancer in her maternal uncle.    ROS: negative except as noted in the HPI  Medications: Current Outpatient Medications  Medication Sig Dispense Refill  . acetaminophen (TYLENOL) 325 MG tablet Take 325-650 mg by mouth every 6 (six) hours as needed for headache.    . albuterol (PROVENTIL) (2.5 MG/3ML) 0.083% nebulizer solution USE 1 VIAL (3 ML) VIA NEBULIZER EVERY 6 HOURS AS NEEDED FOR WHEEZING OR SHORTNESS OF BREATH 810 mL 12  . AMBULATORY NON FORMULARY MEDICATION Medication Name: Needs oxygen concentrator set to 6 liter per minutes as she has a long tubing on her machine.  Fax to [facility] (Patient taking differently: Medication Name: Needs oxygen concentrator set to 8 liter per minutes as she has a long tubing on her machine.  Fax to [facility]) 1 vial 0  . AMBULATORY NON FORMULARY MEDICATION Medication Name: GI cocktail: 150mL Lidocaine2%, 117ml Maalox, 40ml Donnatol. Ok to take twice daily. 250 mL 3  . BREO ELLIPTA 100-25 MCG/INH AEPB USE 1 INHALATION DAILY 360 each 2  . clopidogrel (PLAVIX) 75 MG tablet Take 1 tablet by mouth daily 90 tablet 1  . docusate sodium (COLACE) 100 MG capsule Take 1 capsule (100 mg total) by mouth daily. 30 capsule 0  . dronabinol (MARINOL) 5 MG capsule Take 1 capsule (5 mg total) by mouth 2 (two) times daily. 180 capsule 0  . guaiFENesin (MUCINEX) 600 MG 12 hr tablet Take 1 tablet (600 mg total) by mouth 2 (two) times daily. 30 tablet 0  . HYDROcodone-acetaminophen (NORCO) 10-325 MG tablet Take 1 tablet by mouth every 6 (six) hours as needed. 60 tablet 0  . INCRUSE ELLIPTA 62.5 MCG/INH AEPB USE 1 INHALATION DAILY 90 each 3  . lidocaine (XYLOCAINE) 2 % solution Use as directed 20 mLs in the mouth or throat 2 (two) times daily as needed. 900 mL 3  . losartan (COZAAR) 100 MG tablet Take 1 tablet (100 mg total) by mouth daily. 90 tablet 1  . metoprolol tartrate (LOPRESSOR)  50 MG tablet Take 1 tablet (50 mg total) by mouth 2 (two) times daily. 180 tablet 2  . Multiple Vitamin (MULTIVITAMIN WITH MINERALS) TABS tablet Take 1 tablet by mouth  daily. Pt uses One-A-Day brand    . Nutritional Supplements (FEEDING SUPPLEMENT, BOOST BREEZE,) LIQD Take 1 Can by mouth 3 (three) times daily. 120 Can 0  . omeprazole (PRILOSEC) 40 MG capsule Take 1 capsule (40 mg total) by mouth 2 (two) times daily. 180 capsule 3  . ondansetron (ZOFRAN-ODT) 8 MG disintegrating tablet PLACE 1 TABLET ON THE TONGUE EVERY 8 HOURS AS NEEDED FOR NAUSEA 90 tablet 1  . polyethylene glycol (MIRALAX / GLYCOLAX) packet Take 17 g by mouth daily. 14 each 0  . predniSONE (DELTASONE) 10 MG tablet Take 1 tablet (10 mg total) by mouth daily with breakfast. 90 tablet 0  . promethazine (PHENERGAN) 25 MG tablet Take 1 tablet (25 mg total) by mouth every 8 (eight) hours as needed for nausea or vomiting. 90 tablet 2  . ranitidine (ZANTAC) 150 MG tablet Take 1 tablet (150 mg total) by mouth daily. 90 tablet 4  . traMADol (ULTRAM) 50 MG tablet Take 1 tablet (50 mg total) by mouth every 6 (six) hours as needed (pain). 270 tablet 1  . doxycycline (VIBRA-TABS) 100 MG tablet Take 1 tablet (100 mg total) by mouth 2 (two) times daily for 7 days. 14 tablet 0  . predniSONE (DELTASONE) 20 MG tablet Take 2 tablets (40 mg total) by mouth daily with breakfast for 5 days. 10 tablet 0   No current facility-administered medications for this visit.    Allergies  Allergen Reactions  . Maxidex [Dexamethasone] Other (See Comments)    DEHYDRATION AND HEART RACING  . Advair Diskus [Fluticasone-Salmeterol] Other (See Comments)    No benefit with lungs (INEFFECTIVE)  . Remeron [Mirtazapine] Other (See Comments)    CAUSED NIGHTMARES  . Trazodone And Nefazodone Other (See Comments)    Heart pounding  . Tylox [Oxycodone-Acetaminophen] Itching       Objective:  BP (!) 176/74   Pulse 75   Temp 98.4 F (36.9 C) (Oral)   Resp 14    SpO2 96%  Gen:  alert, not ill-appearing, no distress, frail-appearing female seated in wheelchair wearing nasal cannula HEENT: head normocephalic without obvious abnormality, conjunctiva and cornea clear, oropharynx clear, moist mucous membranes, neck supple, no cervical adenopathy, trachea midline Pulm: Normal work of breathing, normal phonation, diffuse expiratory wheezes CV: Normal rate, regular rhythm, s1 and s2 distinct, no murmurs, clicks or rubs  Neuro: alert and oriented x 3, no tremor MSK: extremities atraumatic, normal gait and station, no peripheral edema Skin: intact, no rashes on exposed skin, no jaundice, no cyanosis Psych: well-groomed, cooperative, good eye contact, euthymic mood, affect mood-congruent, speech is articulate, and thought processes clear and goal-directed    No results found for this or any previous visit (from the past 72 hour(s)). No results found.    Assessment and Plan: 77 y.o. female with   Acute and chronic respiratory failure with hypoxia (HCC) - Plan: methylPREDNISolone sodium succinate (SOLU-MEDROL) 125 mg/2 mL injection 125 mg  Chronic obstructive pulmonary disease with acute exacerbation (HCC) - Plan: doxycycline (VIBRA-TABS) 100 MG tablet, predniSONE (DELTASONE) 20 MG tablet, methylPREDNISolone sodium succinate (SOLU-MEDROL) 125 mg/2 mL injection 125 mg  Severe protein-calorie malnutrition (HCC) - Plan: cyanocobalamin ((VITAMIN B-12)) injection 1,000 mcg  B12 deficiency - Plan: cyanocobalamin ((VITAMIN B-12)) injection 1,000 mcg  SpO2 96% at rest on 8L [place], afebrile, no tachypnea, no tachycardia. Diffuse expiratory wheezes on exam. Treating her for COPD exacerbation in the setting of chronic respiratory failure with hypoxia Patient declined Duoneb in office (pressed for time) Solu-Medrol  125 mg Im given in office followed by Prednisone 40 mg PO burst beginning tomorrow. Patient instructed to hold her Prednisone 10mg  for the next 5  days Doxycycline bid x 1 week  She is very hypertensive, states she always has high readings in the office. Compliant with her antihypertensive medications.  Patient education and anticipatory guidance given Patient agrees with treatment plan Follow-up with PCP in 5 days or sooner as needed if symptoms worsen or fail to improve  Darlyne Russian PA-C

## 2017-12-10 ENCOUNTER — Ambulatory Visit: Payer: Medicare Other

## 2017-12-11 ENCOUNTER — Encounter: Payer: Self-pay | Admitting: Family Medicine

## 2017-12-11 ENCOUNTER — Ambulatory Visit (INDEPENDENT_AMBULATORY_CARE_PROVIDER_SITE_OTHER): Payer: Medicare Other | Admitting: Family Medicine

## 2017-12-11 VITALS — BP 140/72 | HR 60 | Ht 64.0 in | Wt 90.0 lb

## 2017-12-11 DIAGNOSIS — R110 Nausea: Secondary | ICD-10-CM

## 2017-12-11 DIAGNOSIS — G894 Chronic pain syndrome: Secondary | ICD-10-CM

## 2017-12-11 DIAGNOSIS — J441 Chronic obstructive pulmonary disease with (acute) exacerbation: Secondary | ICD-10-CM

## 2017-12-11 DIAGNOSIS — R911 Solitary pulmonary nodule: Secondary | ICD-10-CM | POA: Diagnosis not present

## 2017-12-11 DIAGNOSIS — M5136 Other intervertebral disc degeneration, lumbar region: Secondary | ICD-10-CM | POA: Diagnosis not present

## 2017-12-11 DIAGNOSIS — R530 Neoplastic (malignant) related fatigue: Secondary | ICD-10-CM

## 2017-12-11 MED ORDER — HYDROCODONE-ACETAMINOPHEN 10-325 MG PO TABS: 1.0000 | ORAL_TABLET | Freq: Four times a day (QID) | ORAL | 0 refills | Status: DC | PRN

## 2017-12-11 MED ORDER — SUCRALFATE 1 GM/10ML PO SUSP: 1.0000 g | Freq: Three times a day (TID) | ORAL | 3 refills | Status: DC

## 2017-12-11 MED ORDER — LEVOFLOXACIN 250 MG PO TABS: 250.0000 mg | ORAL_TABLET | Freq: Every day | ORAL | 0 refills | Status: AC

## 2017-12-11 NOTE — Progress Notes (Signed)
Subjective:    Patient ID: Tamara Adams, female    DOB: [birth year], 78 y.o.   MRN: [id number]  HPI 78 yo female with lung cancer is here today for COPD. She is Less short of breath but still feels like she has significant congestion in her chest and is still coughing frequently.  feels the doxy is not really working but do feel that prednisone has improved her ability to breath. She is on 6 Liters O2.   She has been more nauseated as well.  She says she can usually control it with the phenergan and Zofran or GI cocktail but just not working well over the last 2 weeks ago.  No blood in the stool. No vomiting.   Severe COPD-still on 6 L of oxygen.  She has been struggling more and having more flares and exacerbations over this last year.  Started to approach the conversation about end-of-life care with her son in the room.  She seems willing to discuss it but the family has been very resistant.  I discussed that I really want her to consider a palliative care consult.  Review of Systems  BP 140/72   Pulse 60   Ht 5\' 4"  (1.626 m)   Wt 90 lb (40.8 kg)   SpO2 100% Comment: 6L  BMI 15.45 kg/m     Allergies  Allergen Reactions  . Tyloxapol Itching  . Maxidex [Dexamethasone] Other (See Comments)    DEHYDRATION AND HEART RACING  . Advair Diskus [Fluticasone-Salmeterol] Other (See Comments)    No benefit with lungs (INEFFECTIVE)  . Remeron [Mirtazapine] Other (See Comments)    CAUSED NIGHTMARES  . Trazodone And Nefazodone Other (See Comments)    Heart pounding  . Tylox [Oxycodone-Acetaminophen] Itching    Past Medical History:  Diagnosis Date  . Anemia    as a child  . Arthritis    "hands" (12/24/2014)  . Complication of anesthesia    " My blood gas dropped during surgery so I was left on a ventilator and in ICU for 3 days."  . COPD (chronic obstructive pulmonary disease) (HCC)    FVC 72%, FEV1 29%, FEV1 ratio 32% (very severe (COPD)  . COPD (chronic obstructive pulmonary disease)  ([place])   . Degenerative disc disease   . Depression with anxiety   . Diverticulosis   . DVT (deep venous thrombosis) ([place])    "LLE; years ago after a surgery"  . Essential hypertension   . Fluttering sensation of heart    pt put on metoprolol as a result  . GERD (gastroesophageal reflux disease)    PMH  . H/O hiatal hernia   . Headache    "maybe weekly" (12/24/2014)  . Hyperlipidemia   . Hypertension   . Kidney stone   . Liver lesion   . MI (myocardial infarction) ([place]) 07/19/2017  . On home oxygen therapy    "3L; sleep w/it; use it when I rest" (12/24/2014)  . Peptic ulcer   . Pinched nerve    in back  . Pneumonia   . PONV (postoperative nausea and vomiting)   . Shortness of breath dyspnea    with exertion  . Skipped heart beats   . Small bowel obstruction ([place])    "several times; OR twice" (12/24/2014)  . Tubular adenoma of colon 09/2011  . Wears glasses     Past Surgical History:  Procedure Laterality Date  . ABDOMINAL ADHESION SURGERY  12/24/2014  . ABDOMINAL HYSTERECTOMY  1987  and one ovary  . CATARACT EXTRACTION W/ INTRAOCULAR LENS  IMPLANT, BILATERAL Right 06/22/2012   Dr. Lester Kinsman.   Marland Kitchen CATARACT EXTRACTION W/PHACO Left 06/12/2012   Dr. Boykin Reaper  . CHOLECYSTECTOMY N/A 08/18/2013   Procedure: LAPAROSCOPIC CHOLECYSTECTOMY;  Surgeon: Harl Bowie, MD;  Location: [facility];  Service: General;  Laterality: N/A;  . DILATION AND CURETTAGE OF UTERUS    . EXPLORATORY LAPAROTOMY  12/24/2014  . HERNIA REPAIR    . INCISIONAL HERNIA REPAIR  12/24/2014  . INCISIONAL HERNIA REPAIR N/A 12/24/2014   Procedure: HERNIA REPAIR INCISIONAL;  Surgeon: Coralie Keens, MD;  Location: [facility];  Service: General;  Laterality: N/A;  . LAPAROTOMY N/A 12/24/2014   Procedure: EXPLORATORY LAPAROTOMY;  Surgeon: Coralie Keens, MD;  Location: [facility];  Service: General;  Laterality: N/A;  . LYSIS OF ADHESION N/A 12/24/2014   Procedure: LYSIS OF ADHESION;  Surgeon: Coralie Keens, MD;  Location: [facility];  Service: General;  Laterality: N/A;  . MULTIPLE TOOTH EXTRACTIONS    . OOPHORECTOMY  1992  . SMALL INTESTINE SURGERY     for a blockage, no bowel was removed, just kinked from scar tissue  . TUBAL LIGATION    . URETHRAL DILATION      Social History   Socioeconomic History  . Marital status: Married    Spouse name: Not on file  . Number of children: 3  . Years of education: Not on file  . Highest education level: Not on file  Occupational History  . Occupation: retired.    Social Needs  . Financial resource strain: Not on file  . Food insecurity:    Worry: Not on file    Inability: Not on file  . Transportation needs:    Medical: Not on file    Non-medical: Not on file  Tobacco Use  . Smoking status: Former Smoker    Packs/day: 0.25    Years: 57.00    Pack years: 14.25    Types: Cigarettes    Last attempt to quit: 07/25/2015    Years since quitting: 2.3  . Smokeless tobacco: Never Used  . Tobacco comment: 5 cigarettes a day  Substance and Sexual Activity  . Alcohol use: No    Alcohol/week: 0.0 oz  . Drug use: No  . Sexual activity: Never  Lifestyle  . Physical activity:    Days per week: Not on file    Minutes per session: Not on file  . Stress: Not on file  Relationships  . Social connections:    Talks on phone: Not on file    Gets together: Not on file    Attends religious service: Not on file    Active member of club or organization: Not on file    Attends meetings of clubs or organizations: Not on file    Relationship status: Not on file  . Intimate partner violence:    Fear of current or ex partner: Not on file    Emotionally abused: Not on file    Physically abused: Not on file    Forced sexual activity: Not on file  Other Topics Concern  . Not on file  Social History Narrative   No regular exercise.     Family History  Problem Relation Age of Onset  . Hypertension Mother   . Ovarian cancer Mother   . Glaucoma Mother   . Glaucoma Sister    . Pancreatic cancer Maternal Uncle   . Colon cancer Neg Hx   .  Colon polyps Neg Hx   . Diabetes Neg Hx   . Kidney disease Neg Hx   . Gallbladder disease Neg Hx   . Esophageal cancer Neg Hx     Outpatient Encounter Medications as of 12/11/2017  Medication Sig  . acetaminophen (TYLENOL) 325 MG tablet Take 325-650 mg by mouth every 6 (six) hours as needed for headache.  . albuterol (PROVENTIL) (2.5 MG/3ML) 0.083% nebulizer solution USE 1 VIAL (3 ML) VIA NEBULIZER EVERY 6 HOURS AS NEEDED FOR WHEEZING OR SHORTNESS OF BREATH  . AMBULATORY NON FORMULARY MEDICATION Medication Name: Needs oxygen concentrator set to 6 liter per minutes as she has a long tubing on her machine.  Fax to [facility] (Patient taking differently: Medication Name: Needs oxygen concentrator set to 8 liter per minutes as she has a long tubing on her machine.  Fax to [employer])  . AMBULATORY NON FORMULARY MEDICATION Medication Name: GI cocktail: 138mL Lidocaine2%, 162ml Maalox, 33ml Donnatol. Ok to take twice daily.  Marland Kitchen BREO ELLIPTA 100-25 MCG/INH AEPB USE 1 INHALATION DAILY  . clopidogrel (PLAVIX) 75 MG tablet Take 1 tablet by mouth daily  . docusate sodium (COLACE) 100 MG capsule Take 1 capsule (100 mg total) by mouth daily.  Marland Kitchen dronabinol (MARINOL) 5 MG capsule Take 1 capsule (5 mg total) by mouth 2 (two) times daily.  Marland Kitchen guaiFENesin (MUCINEX) 600 MG 12 hr tablet Take 1 tablet (600 mg total) by mouth 2 (two) times daily.  Marland Kitchen HYDROcodone-acetaminophen (NORCO) 10-325 MG tablet Take 1 tablet by mouth every 6 (six) hours as needed.  . INCRUSE ELLIPTA 62.5 MCG/INH AEPB USE 1 INHALATION DAILY  . levofloxacin (LEVAQUIN) 250 MG tablet Take 1 tablet (250 mg total) by mouth daily for 10 days.  Marland Kitchen lidocaine (XYLOCAINE) 2 % solution Use as directed 20 mLs in the mouth or throat 2 (two) times daily as needed.  Marland Kitchen losartan (COZAAR) 100 MG tablet Take 1 tablet (100 mg total) by mouth daily.  . metoprolol tartrate (LOPRESSOR) 50 MG tablet Take 1 tablet  (50 mg total) by mouth 2 (two) times daily.  . Multiple Vitamin (MULTIVITAMIN WITH MINERALS) TABS tablet Take 1 tablet by mouth daily. Pt uses One-A-Day brand  . Nutritional Supplements (FEEDING SUPPLEMENT, BOOST BREEZE,) LIQD Take 1 Can by mouth 3 (three) times daily.  Marland Kitchen omeprazole (PRILOSEC) 40 MG capsule Take 1 capsule (40 mg total) by mouth 2 (two) times daily.  . ondansetron (ZOFRAN-ODT) 8 MG disintegrating tablet PLACE 1 TABLET ON THE TONGUE EVERY 8 HOURS AS NEEDED FOR NAUSEA  . polyethylene glycol (MIRALAX / GLYCOLAX) packet Take 17 g by mouth daily.  . predniSONE (DELTASONE) 10 MG tablet Take 1 tablet (10 mg total) by mouth daily with breakfast.  . promethazine (PHENERGAN) 25 MG tablet Take 1 tablet (25 mg total) by mouth every 8 (eight) hours as needed for nausea or vomiting.  . ranitidine (ZANTAC) 150 MG tablet Take 1 tablet (150 mg total) by mouth daily.  . sucralfate (CARAFATE) 1 GM/10ML suspension Take 10 mLs (1 g total) by mouth 4 (four) times daily -  with meals and at bedtime.  . traMADol (ULTRAM) 50 MG tablet Take 1 tablet (50 mg total) by mouth every 6 (six) hours as needed (pain).  . [DISCONTINUED] doxycycline (VIBRA-TABS) 100 MG tablet Take 1 tablet (100 mg total) by mouth 2 (two) times daily for 7 days.  . [DISCONTINUED] HYDROcodone-acetaminophen (NORCO) 10-325 MG tablet Take 1 tablet by mouth every 6 (six) hours as needed.  No facility-administered encounter medications on file as of 12/11/2017.          Objective:   Physical Exam  Constitutional: She is oriented to person, place, and time. She appears well-developed and well-nourished.  HENT:  Head: Normocephalic and atraumatic.  Cardiovascular: Normal rate, regular rhythm and normal heart sounds.  Pulmonary/Chest: Effort normal.  Diffuse coarse BS bilaterally.   Neurological: She is alert and oriented to person, place, and time.  Skin: Skin is warm and dry.  Psychiatric: She has a normal mood and affect. Her  behavior is normal.       Assessment & Plan:  COPD acute on chronic exacerbation - will d/c doxy and change to levaquin.  F/U in 1 week if needed.    Chronic nausea - will add carafate. Nausea likely exacerbated by recent steroid and NSAID use. Continue phenergan, zofran and dronabinol.  If not improving then please let me know.  We have actually recently increased her dronabinol but it really did not seem to help her symptoms.  Severe COPD  -discussed with her son in the room that I really want them to consider a palliative care consult.  She says that they will talk about it and discuss it.  I really do feel like her risk for mortality in the next couple of years is extremely high.  And at this point she is most interested in quality of life.  Lung cancer-she has a follow-up PET scan scheduled for June.

## 2017-12-12 ENCOUNTER — Encounter: Payer: Self-pay | Admitting: Family Medicine

## 2017-12-26 ENCOUNTER — Ambulatory Visit: Payer: Self-pay | Admitting: Radiation Oncology

## 2018-01-08 ENCOUNTER — Ambulatory Visit (HOSPITAL_COMMUNITY)
Admission: RE | Admit: 2018-01-08 | Discharge: 2018-01-08 | Disposition: A | Discharge status: Home / Self Care | Payer: Medicare Other | Source: Ambulatory Visit | Attending: Family | Admitting: Family

## 2018-01-08 DIAGNOSIS — C3492 Malignant neoplasm of unspecified part of left bronchus or lung: Secondary | ICD-10-CM | POA: Diagnosis not present

## 2018-01-08 DIAGNOSIS — R918 Other nonspecific abnormal finding of lung field: Secondary | ICD-10-CM | POA: Diagnosis not present

## 2018-01-08 DIAGNOSIS — C3490 Malignant neoplasm of unspecified part of unspecified bronchus or lung: Secondary | ICD-10-CM | POA: Insufficient documentation

## 2018-01-08 LAB — GLUCOSE, CAPILLARY: Glucose-Capillary: 75 mg/dL (ref 65–99)

## 2018-01-08 MED ORDER — FLUDEOXYGLUCOSE F - 18 (FDG) INJECTION
4.9000 | Freq: Once | INTRAVENOUS | Status: AC | PRN
  Administered 2018-01-08: 4.9 via INTRAVENOUS

## 2018-01-09 ENCOUNTER — Telehealth: Payer: Self-pay | Admitting: *Deleted

## 2018-01-09 NOTE — Telephone Encounter (Addendum)
Patient is aware of results  ----- Message from Volanda Napoleon, MD sent at 01/08/2018  2:40 PM EDT ----- Call - PET scan shows no growth of cancer!!  Laurey Arrow

## 2018-01-13 ENCOUNTER — Encounter: Payer: Self-pay | Admitting: Family

## 2018-01-13 ENCOUNTER — Other Ambulatory Visit: Payer: Self-pay

## 2018-01-13 ENCOUNTER — Inpatient Hospital Stay: Payer: Medicare Other

## 2018-01-13 ENCOUNTER — Inpatient Hospital Stay: Payer: Medicare Other | Attending: Family | Admitting: Family

## 2018-01-13 VITALS — BP 166/49 | HR 57 | Temp 98.5°F | Resp 19 | Wt 96.0 lb

## 2018-01-13 DIAGNOSIS — Z86718 Personal history of other venous thrombosis and embolism: Secondary | ICD-10-CM | POA: Insufficient documentation

## 2018-01-13 DIAGNOSIS — R5383 Other fatigue: Secondary | ICD-10-CM

## 2018-01-13 DIAGNOSIS — R918 Other nonspecific abnormal finding of lung field: Secondary | ICD-10-CM | POA: Diagnosis not present

## 2018-01-13 DIAGNOSIS — R110 Nausea: Secondary | ICD-10-CM | POA: Diagnosis not present

## 2018-01-13 DIAGNOSIS — K219 Gastro-esophageal reflux disease without esophagitis: Secondary | ICD-10-CM

## 2018-01-13 DIAGNOSIS — Z79899 Other long term (current) drug therapy: Secondary | ICD-10-CM | POA: Insufficient documentation

## 2018-01-13 DIAGNOSIS — R0602 Shortness of breath: Secondary | ICD-10-CM | POA: Diagnosis not present

## 2018-01-13 DIAGNOSIS — D508 Other iron deficiency anemias: Secondary | ICD-10-CM

## 2018-01-13 DIAGNOSIS — C3490 Malignant neoplasm of unspecified part of unspecified bronchus or lung: Secondary | ICD-10-CM

## 2018-01-13 DIAGNOSIS — D509 Iron deficiency anemia, unspecified: Secondary | ICD-10-CM | POA: Diagnosis not present

## 2018-01-13 LAB — CMP (CANCER CENTER ONLY)
ALK PHOS: 78 U/L (ref 40–150)
ALT: 10 U/L (ref 0–55)
AST: 21 U/L (ref 5–34)
Albumin: 3.4 g/dL — ABNORMAL LOW (ref 3.5–5.0)
Anion gap: 8 (ref 3–11)
BUN: 8 mg/dL (ref 7–26)
CALCIUM: 9 mg/dL (ref 8.4–10.4)
CO2: 34 mmol/L — AB (ref 22–29)
Chloride: 92 mmol/L — ABNORMAL LOW (ref 98–109)
Creatinine: 0.8 mg/dL (ref 0.60–1.10)
Glucose, Bld: 80 mg/dL (ref 70–140)
Potassium: 4.2 mmol/L (ref 3.5–5.1)
Sodium: 134 mmol/L — ABNORMAL LOW (ref 136–145)
Total Bilirubin: 0.2 mg/dL (ref 0.2–1.2)
Total Protein: 6.7 g/dL (ref 6.4–8.3)

## 2018-01-13 LAB — CBC WITH DIFFERENTIAL (CANCER CENTER ONLY)
Basophils Absolute: 0.1 10*3/uL (ref 0.0–0.1)
Basophils Relative: 1 %
Eosinophils Absolute: 1.7 10*3/uL — ABNORMAL HIGH (ref 0.0–0.5)
Eosinophils Relative: 17 %
HEMATOCRIT: 32.5 % — AB (ref 34.8–46.6)
HEMOGLOBIN: 10.2 g/dL — AB (ref 11.6–15.9)
LYMPHS ABS: 2 10*3/uL (ref 0.9–3.3)
LYMPHS PCT: 20 %
MCH: 30.4 pg (ref 26.0–34.0)
MCHC: 31.4 g/dL — ABNORMAL LOW (ref 32.0–36.0)
MCV: 96.7 fL (ref 81.0–101.0)
Monocytes Absolute: 1 10*3/uL — ABNORMAL HIGH (ref 0.1–0.9)
Monocytes Relative: 10 %
NEUTROS ABS: 5.1 10*3/uL (ref 1.5–6.5)
NEUTROS PCT: 52 %
Platelet Count: 337 10*3/uL (ref 145–400)
RBC: 3.36 MIL/uL — ABNORMAL LOW (ref 3.70–5.32)
RDW: 12.6 % (ref 11.1–15.7)
WBC Count: 9.8 10*3/uL (ref 3.9–10.0)

## 2018-01-13 LAB — FERRITIN: Ferritin: 311 ng/mL — ABNORMAL HIGH (ref 9–269)

## 2018-01-13 LAB — IRON AND TIBC
IRON: 152 ug/dL — AB (ref 41–142)
Saturation Ratios: 76 % — ABNORMAL HIGH (ref 21–57)
TIBC: 201 ug/dL — ABNORMAL LOW (ref 236–444)
UIBC: 49 ug/dL

## 2018-01-13 NOTE — Progress Notes (Signed)
Hematology and Oncology Follow Up Visit  Tamara Adams [id number] 10-25-1940 77 y.o. 01/13/2018   Principle Diagnosis:  Iron deficeincy anemia Nodule in the leftlungapex  History of DVT of the left lower extremity  Current Therapy:   SBRT s/p 6 fractions completed    Interim History:  [name] is here today with her daughter in law for follow-up. She is doing fairly well but still having some fatigue and her SOB seems to be a little more prominent. She is stable on 8 L supplemental O2 24 hours a day.  PET scan last week showed stable left upper lung lobe nodule and a new hypermetabolic right middle lobe nodule favored to be post infectious/inflammatory. The consolidation and nodularity in the lower lobes noted on previous PET scan have resolved.  She follows up with Dr. Sondra Come the first week in July.  No fever, chills, cough, rash, chest pain, palpitations, abdominal pain or changes in bowel or bladder habits.  She uses Mirilax and a laxative as needed for constipation.  She has GERD with nausea at times, no vomiting. She alternates zofran, Phenergan and GI cocktail as needed.  She will have occasional episodes of dizziness. No falls or syncopal episodes. She is careful when standing and uses her walker for support when ambulating.  Her skin is thin and she does bruise easily. No episodes of bleeding.  No swelling, tenderness, numbness or tingling in her extremities. No c/o pain at this time.  No lymphadenopathy noted on exam.  She states that she has a good appetite and is drinking Boost 1-2 times a day. She is hydrating well and her weight is up 6 lbs since last month.   ECOG Performance Status: 1 - Symptomatic but completely ambulatory  Medications:  Allergies as of 01/13/2018      Reactions   Tyloxapol Itching   Maxidex [dexamethasone] Other (See Comments)   DEHYDRATION AND HEART RACING   Advair Diskus [fluticasone-salmeterol] Other (See Comments)   No benefit with lungs  (INEFFECTIVE)   Remeron [mirtazapine] Other (See Comments)   CAUSED NIGHTMARES   Trazodone And Nefazodone Other (See Comments)   Heart pounding   Tylox [oxycodone-acetaminophen] Itching      Medication List        Accurate as of 01/13/18  2:13 PM. Always use your most recent med list.          acetaminophen 325 MG tablet Commonly known as:  TYLENOL Take 325-650 mg by mouth every 6 (six) hours as needed for headache.   albuterol (2.5 MG/3ML) 0.083% nebulizer solution Commonly known as:  PROVENTIL USE 1 VIAL (3 ML) VIA NEBULIZER EVERY 6 HOURS AS NEEDED FOR WHEEZING OR SHORTNESS OF BREATH   AMBULATORY NON FORMULARY MEDICATION Medication Name: Needs oxygen concentrator set to 6 liter per minutes as she has a long tubing on her machine.  Fax to [facility] Medication Name: GI cocktail: 175mL Lidocaine2%, 143ml Maalox, 6ml Donnatol. Ok to take twice daily.   BREO ELLIPTA 100-25 MCG/INH Aepb Generic drug:  fluticasone furoate-vilanterol USE 1 INHALATION DAILY   clopidogrel 75 MG tablet Commonly known as:  PLAVIX Take 1 tablet by mouth daily   docusate sodium 100 MG capsule Commonly known as:  COLACE Take 1 capsule (100 mg total) by mouth daily.   dronabinol 5 MG capsule Commonly known as:  MARINOL Take 1 capsule (5 mg total) by mouth 2 (two) times daily.   feeding supplement (BOOST BREEZE) Liqd Take 1  Can by mouth 3 (three) times daily.   guaiFENesin 600 MG 12 hr tablet Commonly known as:  MUCINEX Take 1 tablet (600 mg total) by mouth 2 (two) times daily.   HYDROcodone-acetaminophen 10-325 MG tablet Commonly known as:  NORCO Take 1 tablet by mouth every 6 (six) hours as needed.   INCRUSE ELLIPTA 62.5 MCG/INH Aepb Generic drug:  umeclidinium bromide USE 1 INHALATION DAILY   lidocaine 2 % solution Commonly known as:  XYLOCAINE Use as directed 20 mLs in the mouth or throat 2 (two) times daily as needed.   losartan 100 MG  tablet Commonly known as:  COZAAR Take 1 tablet (100 mg total) by mouth daily.   metoprolol tartrate 50 MG tablet Commonly known as:  LOPRESSOR Take 1 tablet (50 mg total) by mouth 2 (two) times daily.   multivitamin with minerals Tabs tablet Take 1 tablet by mouth daily. Pt uses One-A-Day brand   omeprazole 40 MG capsule Commonly known as:  PRILOSEC Take 1 capsule (40 mg total) by mouth 2 (two) times daily.   ondansetron 8 MG disintegrating tablet Commonly known as:  ZOFRAN-ODT PLACE 1 TABLET ON THE TONGUE EVERY 8 HOURS AS NEEDED FOR NAUSEA   polyethylene glycol packet Commonly known as:  MIRALAX / GLYCOLAX Take 17 g by mouth daily.   predniSONE 10 MG tablet Commonly known as:  DELTASONE Take 1 tablet (10 mg total) by mouth daily with breakfast.   promethazine 25 MG tablet Commonly known as:  PHENERGAN Take 1 tablet (25 mg total) by mouth every 8 (eight) hours as needed for nausea or vomiting.   ranitidine 150 MG tablet Commonly known as:  ZANTAC Take 1 tablet (150 mg total) by mouth daily.   sucralfate 1 GM/10ML suspension Commonly known as:  CARAFATE Take 10 mLs (1 g total) by mouth 4 (four) times daily -  with meals and at bedtime.   traMADol 50 MG tablet Commonly known as:  ULTRAM Take 1 tablet (50 mg total) by mouth every 6 (six) hours as needed (pain).       Allergies:  Allergies  Allergen Reactions  . Tyloxapol Itching  . Maxidex [Dexamethasone] Other (See Comments)    DEHYDRATION AND HEART RACING  . Advair Diskus [Fluticasone-Salmeterol] Other (See Comments)    No benefit with lungs (INEFFECTIVE)  . Remeron [Mirtazapine] Other (See Comments)    CAUSED NIGHTMARES  . Trazodone And Nefazodone Other (See Comments)    Heart pounding  . Tylox [Oxycodone-Acetaminophen] Itching    Past Medical History, Surgical history, Social history, and Family History were reviewed and updated.  Review of Systems: All other 10 point review of systems is negative.    Physical Exam:  weight is 96 lb (43.5 kg). Her oral temperature is 98.5 F (36.9 C). Her blood pressure is 166/49 (abnormal) and her pulse is 57 (abnormal). Her respiration is 19 and oxygen saturation is 96%.   Wt Readings from Last 3 Encounters:  01/13/18 96 lb (43.5 kg)  12/11/17 90 lb (40.8 kg)  11/13/17 90 lb (40.8 kg)    Ocular: Sclerae unicteric, pupils equal, round and reactive to light Ear-nose-throat: Oropharynx clear, dentition fair Lymphatic: No cervical, supraclavicular or axillary adenopathy Lungs no rales or rhonchi, good excursion bilaterally Heart regular rate and rhythm, no murmur appreciated Abd soft, nontender, positive bowel sounds, no liver or spleen tip palpated on exam, no fluid wave  MSK no focal spinal tenderness, no joint edema Neuro: non-focal, well-oriented, appropriate affect Breasts: Deferred  Lab Results  Component Value Date   WBC 9.8 01/13/2018   HGB 10.2 (L) 01/13/2018   HCT 32.5 (L) 01/13/2018   MCV 96.7 01/13/2018   PLT 337 01/13/2018   Lab Results  Component Value Date   FERRITIN 316 (H) 11/13/2017   IRON 114 11/13/2017   TIBC 178 (L) 11/13/2017   UIBC 64 11/13/2017   IRONPCTSAT 64 (H) 11/13/2017   Lab Results  Component Value Date   RETICCTPCT 2.0 09/22/2017   RBC 3.36 (L) 01/13/2018   No results found for: KPAFRELGTCHN, LAMBDASER, KAPLAMBRATIO No results found for: IGGSERUM, IGA, IGMSERUM No results found for: Ronnald Ramp, A1GS, A2GS, BETS, BETA2SER, GAMS, MSPIKE, SPEI   Chemistry      Component Value Date/Time   NA 134 (L) 11/13/2017 1328   K 4.0 11/13/2017 1328   CL 95 (L) 11/13/2017 1328   CO2 27 11/13/2017 1328   BUN 7 11/13/2017 1328   CREATININE 0.74 11/13/2017 1328   CREATININE 0.72 08/05/2017 1440      Component Value Date/Time   CALCIUM 9.0 11/13/2017 1328   ALKPHOS 72 11/13/2017 1328   AST 18 11/13/2017 1328   ALT 11 11/13/2017 1328   BILITOT <0.2 (L) 11/13/2017 1328      Impression and  Plan: Ms. Hoston is a very pleasant 77 yo caucasian female with history of iron deficiency and DVT of the left lower extremity. She is doing well on Plavix.   We will see what her iron studies show and bring her back in for infusion if needed.  She also has a left upper lung lobe nodule which PET scan last week showed was stable.  We will plan to see her back in another 3 months for follow-up. We will hold off on repeat scans at that time for now.  The will contact our office with any questions or concerns. We can certainly see her sooner if need be.   Laverna Peace, NP 6/24/20192:13 PM

## 2018-01-20 ENCOUNTER — Telehealth: Payer: Self-pay | Admitting: Family Medicine

## 2018-01-20 DIAGNOSIS — G894 Chronic pain syndrome: Secondary | ICD-10-CM

## 2018-01-20 DIAGNOSIS — M5136 Other intervertebral disc degeneration, lumbar region: Secondary | ICD-10-CM

## 2018-01-20 MED ORDER — HYDROCODONE-ACETAMINOPHEN 10-325 MG PO TABS: 1.0000 | ORAL_TABLET | Freq: Four times a day (QID) | ORAL | 0 refills | Status: DC | PRN

## 2018-01-20 NOTE — Telephone Encounter (Signed)
Rx sent 

## 2018-01-20 NOTE — Telephone Encounter (Signed)
Patient calls triage line and request a refill on her Hydrocodone. Please advise. KG LPN

## 2018-01-21 ENCOUNTER — Other Ambulatory Visit: Payer: Self-pay | Admitting: Family Medicine

## 2018-01-27 ENCOUNTER — Other Ambulatory Visit: Payer: Self-pay

## 2018-01-27 ENCOUNTER — Ambulatory Visit
Admission: RE | Admit: 2018-01-27 | Discharge: 2018-01-27 | Disposition: A | Discharge status: Home / Self Care | Payer: Medicare Other | Source: Ambulatory Visit | Attending: Radiation Oncology | Admitting: Radiation Oncology

## 2018-01-27 ENCOUNTER — Encounter: Payer: Self-pay | Admitting: Radiation Oncology

## 2018-01-27 VITALS — BP 188/61 | HR 56 | Temp 97.9°F | Resp 20 | Ht 64.0 in | Wt 99.2 lb

## 2018-01-27 DIAGNOSIS — Z90710 Acquired absence of both cervix and uterus: Secondary | ICD-10-CM | POA: Insufficient documentation

## 2018-01-27 DIAGNOSIS — Z923 Personal history of irradiation: Secondary | ICD-10-CM | POA: Insufficient documentation

## 2018-01-27 DIAGNOSIS — Z79899 Other long term (current) drug therapy: Secondary | ICD-10-CM | POA: Insufficient documentation

## 2018-01-27 DIAGNOSIS — Z08 Encounter for follow-up examination after completed treatment for malignant neoplasm: Secondary | ICD-10-CM | POA: Diagnosis not present

## 2018-01-27 DIAGNOSIS — K5730 Diverticulosis of large intestine without perforation or abscess without bleeding: Secondary | ICD-10-CM | POA: Diagnosis not present

## 2018-01-27 DIAGNOSIS — R0602 Shortness of breath: Secondary | ICD-10-CM | POA: Diagnosis not present

## 2018-01-27 DIAGNOSIS — R911 Solitary pulmonary nodule: Secondary | ICD-10-CM | POA: Diagnosis not present

## 2018-01-27 DIAGNOSIS — I700 Atherosclerosis of aorta: Secondary | ICD-10-CM | POA: Diagnosis not present

## 2018-01-27 NOTE — Progress Notes (Signed)
Ms. Stampley is here for a follow-up appointment.Patient denies any pain. States that she has severe fatigue. States that she has shortness of breath with activity and at rest.. She is currently on 8 liter of oxygen when she is at home..Denies any difficulty with swallowing. States that her appetite is good. States that she is coughing  up clear phlegm.Denies any issues with her skin.Blood pressure was elevated this monring. Denies any headaches,numbness or tingling in her extremties,or chest pains. Advised to notify primary doctor of her blood pressures. States that  the doctor is aware. . Vitals:   01/27/18 1100 01/27/18 1117  BP: (!) 196/74 (!) 188/61  Pulse: 63 (!) 56  Resp: 20   Temp: 97.9 F (36.6 C)   TempSrc: Oral   SpO2: 100%   Weight: 99 lb 3.2 oz (45 kg)   Height: 5\' 4"  (1.626 m)   PF: (!) 8 L/min    Wt Readings from Last 3 Encounters:  01/27/18 99 lb 3.2 oz (45 kg)  01/13/18 96 lb (43.5 kg)  12/11/17 90 lb (40.8 kg)

## 2018-01-27 NOTE — Progress Notes (Signed)
Radiation Oncology         [phone] ________________________________  Name: Tamara Adams MRN: [id number]  Date: 01/27/2018  DOB: February 17, 1941  Follow-Up Visit Note  CC: [name], MD  Volanda Napoleon, MD    ICD-10-CM   1. Nodule of left lung R91.1     Diagnosis:   77 y.o. female with PET positive pulmonary nodule presenting in the apex of the left upper lobe   Interval Since Last Radiation:  4 months  Radiation treatment dates:09/17/2017-09/23/2017 Site/dose:The leftupperlung was treated to 54 Gy in 3 fractions of 18 Gy.  Narrative:  The patient returns today for routine follow-up.  Since last follow-up, she had a chest CT done on 10/30/17 that showed the apical left upper lobe solid pulmonary nodule, measuring 2.5 cm, stable to slightly decreased since March chest CT. No findings suspicious for metastatic disease in the chest. She also had a PET scan on 01/08/18 which did not show any significant change in size of the left upper lobe nodule. The nodule remains mildly hypermetabolic. There is a new hypermetabolic ground-glass nodule in the right middle lobe, favored to be post-infectious or inflammatory. She will see Dr. Marin Olp in about 3 months.   She denies any pain. She reports severe fatigue. She reports shortness of breath with activity and at rest that has gotten worse since finishing radiation. She is regularly on 8 liters of oxygen when she is at home. She has not seen her pulmonologist in the past 2-3 years. She denies any difficulty with swallowing. She states that her appetite is good. She reports coughing up clear phlegm. She denies hemoptysis. She denies any issues with her skin. She states that her blood pressure was elevated this morning. She denies any headaches, numbness or tingling in her extremities, or chest pain. She states that her PCP is aware of elevated blood pressure.  ALLERGIES:  is allergic to tyloxapol; maxidex [dexamethasone]; advair diskus  [fluticasone-salmeterol]; remeron [mirtazapine]; trazodone and nefazodone; and tylox [oxycodone-acetaminophen].  Meds: Current Outpatient Medications  Medication Sig Dispense Refill  . acetaminophen (TYLENOL) 325 MG tablet Take 325-650 mg by mouth every 6 (six) hours as needed for headache.    . albuterol (PROVENTIL) (2.5 MG/3ML) 0.083% nebulizer solution USE 1 VIAL (3 ML) VIA NEBULIZER EVERY 6 HOURS AS NEEDED FOR WHEEZING OR SHORTNESS OF BREATH 810 mL 12  . AMBULATORY NON FORMULARY MEDICATION Medication Name: Needs oxygen concentrator set to 6 liter per minutes as she has a long tubing on her machine.  Fax to [facility] (Patient taking differently: Medication Name: Needs oxygen concentrator set to 8 liter per minutes as she has a long tubing on her machine.  Fax to [facility]) 1 vial 0  . AMBULATORY NON FORMULARY MEDICATION Medication Name: GI cocktail: 167mL Lidocaine2%, 129ml Maalox, 14ml Donnatol. Ok to take twice daily. 250 mL 3  . BREO ELLIPTA 100-25 MCG/INH AEPB USE 1 INHALATION DAILY 360 each 2  . clopidogrel (PLAVIX) 75 MG tablet TAKE 1 TABLET DAILY 90 tablet 1  . docusate sodium (COLACE) 100 MG capsule Take 1 capsule (100 mg total) by mouth daily. 30 capsule 0  . dronabinol (MARINOL) 5 MG capsule Take 1 capsule (5 mg total) by mouth 2 (two) times daily. 180 capsule 0  . guaiFENesin (MUCINEX) 600 MG 12 hr tablet Take 1 tablet (600 mg total) by mouth 2 (two) times daily. 30 tablet 0  . HYDROcodone-acetaminophen (NORCO) 10-325 MG tablet Take 1 tablet by mouth every 6 (six) hours  as needed. 60 tablet 0  . INCRUSE ELLIPTA 62.5 MCG/INH AEPB USE 1 INHALATION DAILY 90 each 3  . lidocaine (XYLOCAINE) 2 % solution Use as directed 20 mLs in the mouth or throat 2 (two) times daily as needed. 900 mL 3  . losartan (COZAAR) 100 MG tablet Take 1 tablet (100 mg total) by mouth daily. 90 tablet 1  . metoprolol tartrate (LOPRESSOR) 50 MG tablet Take 1 tablet (50 mg total) by mouth 2 (two) times daily. 180 tablet 2    . Multiple Vitamin (MULTIVITAMIN WITH MINERALS) TABS tablet Take 1 tablet by mouth daily. Pt uses One-A-Day brand    . Nutritional Supplements (FEEDING SUPPLEMENT, BOOST BREEZE,) LIQD Take 1 Can by mouth 3 (three) times daily. 120 Can 0  . omeprazole (PRILOSEC) 40 MG capsule Take 1 capsule (40 mg total) by mouth 2 (two) times daily. 180 capsule 3  . ondansetron (ZOFRAN-ODT) 8 MG disintegrating tablet PLACE 1 TABLET ON THE TONGUE EVERY 8 HOURS AS NEEDED FOR NAUSEA 90 tablet 1  . polyethylene glycol (MIRALAX / GLYCOLAX) packet Take 17 g by mouth daily. 14 each 0  . predniSONE (DELTASONE) 10 MG tablet Take 1 tablet (10 mg total) by mouth daily with breakfast. 90 tablet 0  . promethazine (PHENERGAN) 25 MG tablet Take 1 tablet (25 mg total) by mouth every 8 (eight) hours as needed for nausea or vomiting. 90 tablet 2  . ranitidine (ZANTAC) 150 MG tablet Take 1 tablet (150 mg total) by mouth daily. 90 tablet 4  . traMADol (ULTRAM) 50 MG tablet Take 1 tablet (50 mg total) by mouth every 6 (six) hours as needed (pain). 270 tablet 1  . sucralfate (CARAFATE) 1 GM/10ML suspension Take 10 mLs (1 g total) by mouth 4 (four) times daily -  with meals and at bedtime. (Patient not taking: Reported on 01/27/2018) 420 mL 3   No current facility-administered medications for this encounter.     Physical Findings: The patient is in no acute distress. Patient is alert and oriented.  height is 5\' 4"  (1.626 m) and weight is 99 lb 3.2 oz (45 kg). Her oral temperature is 97.9 F (36.6 C). Her blood pressure is 188/61 (abnormal) and her pulse is 56 (abnormal). Her respiration is 20 and oxygen saturation is 100%.   Lungs are clear to auscultation bilaterally. Heart has regular rate and rhythm. No palpable cervical, supraclavicular, or axillary adenopathy. Abdomen soft, non-tender, normal bowel sounds.  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 EXAM: NUCLEAR MEDICINE PET SKULL BASE TO THIGH TECHNIQUE: 4.9 mCi F-18 FDG was injected intravenously. Full-ring PET imaging was performed from the skull base to thigh after the radiotracer. CT data was obtained and used for attenuation correction and anatomic localization. Fasting blood glucose: 75 mg/dl COMPARISON:  PET-CT 08/19/2017 FINDINGS: Mediastinal blood pool activity: SUV max 1.94 NECK: No hypermetabolic lymph nodes in the neck. Incidental CT findings: none CHEST: LEFT upper lobe nodule of concern is not changed in size measuring 2.2 X 1.9 cm (image 6/8) which compares to 2.5 x 1.7 cm. There is a rim of hypermetabolic arc like  thickening peripheral to this lesion which is new from prior. This rim of thickened tissue is hypermetabolic with SUV max equal 4.4. The nodule itself has low metabolic activity with SUV max equal 2.5. Hypermetabolic nodule in the RIGHT middle lobe is a ground-glass pattern measuring 16 mm (image 25/8) with SUV max equal 2 3. This nodule is new from comparison exam Within the LEFT lower lobe band of consolidation with air bronchograms has near completely resolved. Likewise nodule lesion previously in the RIGHT lower lobe has resolved. No hypermetabolic mediastinal lymph nodes. Incidental CT findings: Centrilobular emphysema the upper lobes. ABDOMEN/PELVIS: No abnormal hypermetabolic activity within the liver, pancreas, adrenal glands, or spleen. No hypermetabolic lymph nodes in the abdomen or pelvis. Incidental CT findings: Atherosclerotic calcification of the aorta. Multiple diverticula of the LEFT colon. Post hysterectomy SKELETON: No focal hypermetabolic activity to suggest skeletal metastasis. Incidental CT findings:  none IMPRESSION: 1. No significant change in size of LEFT upper lobe nodule following SBRT. Nodule remains mildly hypermetabolic. Arc like band of thickened hypermetabolic tissue peripheral to the treatment site is most consistent radiation lung change. 2. New hypermetabolic ground-glass nodule in the RIGHT middle lobe is favored post infectious or inflammatory. 3. Resolution of consolidation and nodularity in the lower lobes lobes consistent with resolving pulmonary infection. Electronically Signed   By: Suzy Bouchard M.D.   On: 01/08/2018 13:12    Impression:  Clinically stable. Recent PET scan shows minimal residual activity at the treated area. No obvious new problems. Patient has worsening of her breathing since radiation therapy. I've recommended she follow up with her pulmonologist since it's been several months,  to see if they may have input concerning her dyspnea.   Plan:  Follow up in radiation oncology in 3 months.  ____________________________________  Blair Promise, PhD, MD  This document serves as a record of services personally performed by Gery Pray, MD. It was created on his behalf by Rae Lips, a trained medical scribe. The creation of this record is based on the scribe's personal observations and the provider's statements to them. This document has been checked and approved by the attending provider.

## 2018-02-06 ENCOUNTER — Other Ambulatory Visit: Payer: Self-pay

## 2018-02-06 ENCOUNTER — Encounter (HOSPITAL_BASED_OUTPATIENT_CLINIC_OR_DEPARTMENT_OTHER): Payer: Self-pay | Admitting: Emergency Medicine

## 2018-02-06 ENCOUNTER — Inpatient Hospital Stay (HOSPITAL_BASED_OUTPATIENT_CLINIC_OR_DEPARTMENT_OTHER)
Admission: EM | Admit: 2018-02-06 | Discharge: 2018-02-12 | DRG: 394 | Disposition: A | Discharge status: Home / Self Care | Payer: Medicare Other | Attending: Surgery | Admitting: Surgery

## 2018-02-06 ENCOUNTER — Emergency Department (HOSPITAL_BASED_OUTPATIENT_CLINIC_OR_DEPARTMENT_OTHER): Payer: Medicare Other

## 2018-02-06 ENCOUNTER — Emergency Department (HOSPITAL_COMMUNITY): Payer: Medicare Other

## 2018-02-06 DIAGNOSIS — K219 Gastro-esophageal reflux disease without esophagitis: Secondary | ICD-10-CM | POA: Diagnosis present

## 2018-02-06 DIAGNOSIS — I480 Paroxysmal atrial fibrillation: Secondary | ICD-10-CM | POA: Diagnosis present

## 2018-02-06 DIAGNOSIS — I10 Essential (primary) hypertension: Secondary | ICD-10-CM | POA: Diagnosis present

## 2018-02-06 DIAGNOSIS — Z8719 Personal history of other diseases of the digestive system: Secondary | ICD-10-CM | POA: Diagnosis not present

## 2018-02-06 DIAGNOSIS — Z9981 Dependence on supplemental oxygen: Secondary | ICD-10-CM | POA: Diagnosis not present

## 2018-02-06 DIAGNOSIS — Z86010 Personal history of colonic polyps: Secondary | ICD-10-CM

## 2018-02-06 DIAGNOSIS — J9611 Chronic respiratory failure with hypoxia: Secondary | ICD-10-CM | POA: Diagnosis present

## 2018-02-06 DIAGNOSIS — Z923 Personal history of irradiation: Secondary | ICD-10-CM

## 2018-02-06 DIAGNOSIS — Z7951 Long term (current) use of inhaled steroids: Secondary | ICD-10-CM

## 2018-02-06 DIAGNOSIS — Z86718 Personal history of other venous thrombosis and embolism: Secondary | ICD-10-CM

## 2018-02-06 DIAGNOSIS — I2510 Atherosclerotic heart disease of native coronary artery without angina pectoris: Secondary | ICD-10-CM | POA: Diagnosis present

## 2018-02-06 DIAGNOSIS — Z888 Allergy status to other drugs, medicaments and biological substances status: Secondary | ICD-10-CM

## 2018-02-06 DIAGNOSIS — K3580 Unspecified acute appendicitis: Secondary | ICD-10-CM | POA: Diagnosis not present

## 2018-02-06 DIAGNOSIS — I252 Old myocardial infarction: Secondary | ICD-10-CM

## 2018-02-06 DIAGNOSIS — Z8711 Personal history of peptic ulcer disease: Secondary | ICD-10-CM | POA: Diagnosis not present

## 2018-02-06 DIAGNOSIS — Z973 Presence of spectacles and contact lenses: Secondary | ICD-10-CM

## 2018-02-06 DIAGNOSIS — Z7189 Other specified counseling: Secondary | ICD-10-CM | POA: Diagnosis not present

## 2018-02-06 DIAGNOSIS — Z87891 Personal history of nicotine dependence: Secondary | ICD-10-CM

## 2018-02-06 DIAGNOSIS — Z9049 Acquired absence of other specified parts of digestive tract: Secondary | ICD-10-CM

## 2018-02-06 DIAGNOSIS — Z885 Allergy status to narcotic agent status: Secondary | ICD-10-CM

## 2018-02-06 DIAGNOSIS — Z8249 Family history of ischemic heart disease and other diseases of the circulatory system: Secondary | ICD-10-CM

## 2018-02-06 DIAGNOSIS — K4090 Unilateral inguinal hernia, without obstruction or gangrene, not specified as recurrent: Secondary | ICD-10-CM | POA: Diagnosis not present

## 2018-02-06 DIAGNOSIS — R1031 Right lower quadrant pain: Secondary | ICD-10-CM | POA: Diagnosis not present

## 2018-02-06 DIAGNOSIS — Z515 Encounter for palliative care: Secondary | ICD-10-CM | POA: Diagnosis not present

## 2018-02-06 DIAGNOSIS — Z01818 Encounter for other preprocedural examination: Secondary | ICD-10-CM | POA: Diagnosis not present

## 2018-02-06 DIAGNOSIS — J449 Chronic obstructive pulmonary disease, unspecified: Secondary | ICD-10-CM | POA: Diagnosis present

## 2018-02-06 DIAGNOSIS — J439 Emphysema, unspecified: Secondary | ICD-10-CM | POA: Diagnosis not present

## 2018-02-06 DIAGNOSIS — R1012 Left upper quadrant pain: Secondary | ICD-10-CM | POA: Diagnosis not present

## 2018-02-06 DIAGNOSIS — R1011 Right upper quadrant pain: Secondary | ICD-10-CM | POA: Diagnosis not present

## 2018-02-06 DIAGNOSIS — R1013 Epigastric pain: Secondary | ICD-10-CM | POA: Diagnosis not present

## 2018-02-06 DIAGNOSIS — Z789 Other specified health status: Secondary | ICD-10-CM

## 2018-02-06 DIAGNOSIS — Z961 Presence of intraocular lens: Secondary | ICD-10-CM | POA: Diagnosis present

## 2018-02-06 DIAGNOSIS — M5136 Other intervertebral disc degeneration, lumbar region: Secondary | ICD-10-CM

## 2018-02-06 DIAGNOSIS — Z7902 Long term (current) use of antithrombotics/antiplatelets: Secondary | ICD-10-CM

## 2018-02-06 DIAGNOSIS — Z90721 Acquired absence of ovaries, unilateral: Secondary | ICD-10-CM | POA: Diagnosis not present

## 2018-02-06 DIAGNOSIS — Z66 Do not resuscitate: Secondary | ICD-10-CM | POA: Diagnosis present

## 2018-02-06 DIAGNOSIS — Z87442 Personal history of urinary calculi: Secondary | ICD-10-CM | POA: Diagnosis not present

## 2018-02-06 DIAGNOSIS — Z9841 Cataract extraction status, right eye: Secondary | ICD-10-CM

## 2018-02-06 DIAGNOSIS — Z9842 Cataract extraction status, left eye: Secondary | ICD-10-CM

## 2018-02-06 DIAGNOSIS — K37 Unspecified appendicitis: Secondary | ICD-10-CM | POA: Diagnosis present

## 2018-02-06 DIAGNOSIS — Z90710 Acquired absence of both cervix and uterus: Secondary | ICD-10-CM

## 2018-02-06 DIAGNOSIS — Z800 Family history of malignant neoplasm of digestive organs: Secondary | ICD-10-CM

## 2018-02-06 DIAGNOSIS — Z7952 Long term (current) use of systemic steroids: Secondary | ICD-10-CM

## 2018-02-06 DIAGNOSIS — Z83511 Family history of glaucoma: Secondary | ICD-10-CM

## 2018-02-06 DIAGNOSIS — Z85118 Personal history of other malignant neoplasm of bronchus and lung: Secondary | ICD-10-CM | POA: Diagnosis not present

## 2018-02-06 DIAGNOSIS — Z8041 Family history of malignant neoplasm of ovary: Secondary | ICD-10-CM

## 2018-02-06 DIAGNOSIS — G894 Chronic pain syndrome: Secondary | ICD-10-CM

## 2018-02-06 DIAGNOSIS — Z79899 Other long term (current) drug therapy: Secondary | ICD-10-CM

## 2018-02-06 LAB — COMPREHENSIVE METABOLIC PANEL
ALT: 12 U/L (ref 0–44)
ANION GAP: 11 (ref 5–15)
AST: 21 U/L (ref 15–41)
Albumin: 3.6 g/dL (ref 3.5–5.0)
Alkaline Phosphatase: 72 U/L (ref 38–126)
BUN: 8 mg/dL (ref 8–23)
CO2: 29 mmol/L (ref 22–32)
Calcium: 8.6 mg/dL — ABNORMAL LOW (ref 8.9–10.3)
Chloride: 93 mmol/L — ABNORMAL LOW (ref 98–111)
Creatinine, Ser: 0.73 mg/dL (ref 0.44–1.00)
GFR calc Af Amer: >60 mL/min (ref >60)
GFR calc non Af Amer: >60 mL/min (ref >60)
Glucose, Bld: 101 mg/dL — ABNORMAL HIGH (ref 70–99)
POTASSIUM: 4.3 mmol/L (ref 3.5–5.1)
SODIUM: 133 mmol/L — AB (ref 135–145)
TOTAL PROTEIN: 6.8 g/dL (ref 6.5–8.1)
Total Bilirubin: 0.2 mg/dL — ABNORMAL LOW (ref 0.3–1.2)

## 2018-02-06 LAB — CBC WITH DIFFERENTIAL/PLATELET
BASOS PCT: 1 %
Basophils Absolute: 0.1 10*3/uL (ref 0.0–0.1)
Eosinophils Absolute: 1.2 10*3/uL — ABNORMAL HIGH (ref 0.0–0.7)
Eosinophils Relative: 11 %
HEMATOCRIT: 36.9 % (ref 36.0–46.0)
HEMOGLOBIN: 12.1 g/dL (ref 12.0–15.0)
LYMPHS ABS: 1.6 10*3/uL (ref 0.7–4.0)
Lymphocytes Relative: 15 %
MCH: 30.8 pg (ref 26.0–34.0)
MCHC: 32.8 g/dL (ref 30.0–36.0)
MCV: 93.9 fL (ref 78.0–100.0)
MONOS PCT: 10 %
Monocytes Absolute: 1.1 10*3/uL — ABNORMAL HIGH (ref 0.1–1.0)
NEUTROS ABS: 6.9 10*3/uL (ref 1.7–7.7)
NEUTROS PCT: 63 %
Platelets: 325 10*3/uL (ref 150–400)
RBC: 3.93 MIL/uL (ref 3.87–5.11)
RDW: 12.3 % (ref 11.5–15.5)
WBC: 10.9 10*3/uL — AB (ref 4.0–10.5)

## 2018-02-06 LAB — LIPASE, BLOOD: Lipase: 33 U/L (ref 11–51)

## 2018-02-06 MED ORDER — PIPERACILLIN-TAZOBACTAM 3.375 G IVPB 30 MIN
3.3750 g | Freq: Once | INTRAVENOUS | Status: AC
  Administered 2018-02-06: 3.375 g via INTRAVENOUS
  Filled 2018-02-06: qty 50

## 2018-02-06 MED ORDER — HYDROMORPHONE HCL 1 MG/ML IJ SOLN
1.0000 mg | Freq: Once | INTRAMUSCULAR | Status: AC
  Administered 2018-02-06: 1 mg via INTRAVENOUS
  Filled 2018-02-06: qty 1

## 2018-02-06 MED ORDER — IOPAMIDOL (ISOVUE-300) INJECTION 61%
100.0000 mL | Freq: Once | INTRAVENOUS | Status: AC | PRN
  Administered 2018-02-06: 100 mL via INTRAVENOUS

## 2018-02-06 MED ORDER — SODIUM CHLORIDE 0.9 % IV BOLUS
1000.0000 mL | Freq: Once | INTRAVENOUS | Status: AC
  Administered 2018-02-06: 1000 mL via INTRAVENOUS

## 2018-02-06 MED ORDER — METRONIDAZOLE IN NACL 5-0.79 MG/ML-% IV SOLN
500.0000 mg | Freq: Once | INTRAVENOUS | Status: AC
  Administered 2018-02-06: 500 mg via INTRAVENOUS
  Filled 2018-02-06: qty 100

## 2018-02-06 MED ORDER — ONDANSETRON HCL 4 MG/2ML IJ SOLN
4.0000 mg | Freq: Once | INTRAMUSCULAR | Status: AC
  Administered 2018-02-06: 4 mg via INTRAVENOUS
  Filled 2018-02-06: qty 2

## 2018-02-06 MED ORDER — SODIUM CHLORIDE 0.9 % IV SOLN
1.0000 g | Freq: Once | INTRAVENOUS | Status: AC
  Administered 2018-02-06: 1 g via INTRAVENOUS
  Filled 2018-02-06: qty 10

## 2018-02-06 NOTE — ED Triage Notes (Signed)
Abd pain x 3 days. Denies N/V. Hx of SBO. Also c/o increase of SOB. States she wears 8 liter O2 at home.

## 2018-02-06 NOTE — Consult Note (Addendum)
.. ..  Name: Delrose Rohwer MRN: [id number] DOB: 29-Jun-1941                                                     PULMONARY CONSULTATION ADMISSION DATE:  02/06/2018 CONSULTATION DATE:  02/06/18  REFERRING MD :  Darl Householder MD- ER provider and Hal Hope MD IM  CHIEF COMPLAINT:  Abdominal pain  BRIEF PATIENT DESCRIPTION: 77 yr old female with PMHx significant for severe emphysema on 8L O2 at home, with h/o hypermetabolic 2.5 cm pulmonary nodule s/p SBRT x 6 fractions presents with abdominal pain found to have acute appendicitis  PCCM consulted for preop clearance.  SIGNIFICANT EVENTS    STUDIES:  CT Abdomen Pelvis: Appendix: Location: Medial to the cecum coursing posteriorly. Diameter: 11 mm. Appendicolith: One at the tip and one at the base. Mucosal hyper-enhancement: Present. Extraluminal gas: None. Periappendiceal collection: None.   HISTORY OF PRESENT ILLNESS:  77 yr old female with PMHx severe COPD on 8LO2 ( FEV1 78%), hypermetabolic lung nodule ( 2.9FA) s/ SBRT x 6, H/o DVT, GERD,PUD,  HTN, HLD, nephrolithiasis, MI, SBO in 2016, Tubular adenoma of the colon, hysterectomy, cholecystectomy presents on 02/06/18 to [facility] with complaints of abdominal pain for the last 3 days. She reports nausea but no vomiting.  She denies any fevers or chills.  She does report loose stool yesterday.  She had a bowel movement this morning that was nonbloody.  She denies any urinary complaints.  This feels similar to prior obstructions.  PCCM was consulted for preop clearance  PAST MEDICAL HISTORY :   has a past medical history of Anemia, Arthritis, Complication of anesthesia, COPD (chronic obstructive pulmonary disease) ([place]), COPD (chronic obstructive pulmonary disease) ([place]), Degenerative disc disease, Depression with anxiety, Diverticulosis, DVT (deep venous thrombosis) ([place]), Essential hypertension, Fluttering sensation of heart, GERD (gastroesophageal reflux disease), H/O hiatal hernia, Headache, Hyperlipidemia,  Hypertension, Kidney stone, Liver lesion, MI (myocardial infarction) ([place]) (07/19/2017), On home oxygen therapy, Peptic ulcer, Pinched nerve, Pneumonia, PONV (postoperative nausea and vomiting), Shortness of breath dyspnea, Skipped heart beats, Small bowel obstruction ([place]), Tubular adenoma of colon (09/2011), and Wears glasses.  has a past surgical history that includes Abdominal hysterectomy (1987); Oophorectomy (1992); Cataract extraction w/PHACO (Left, 06/12/2012); Cataract extraction w/ intraocular lens  implant, bilateral (Right, 06/22/2012); Urethral dilation; Small intestine surgery; Cholecystectomy (N/A, 08/18/2013); Dilation and curettage of uterus; Tubal ligation; Multiple tooth extractions; Hernia repair; Exploratory laparotomy (12/24/2014); Abdominal adhesion surgery (12/24/2014); Incisional hernia repair (12/24/2014); laparotomy (N/A, 12/24/2014); Lysis of adhesion (N/A, 12/24/2014); and Incisional hernia repair (N/A, 12/24/2014). Prior to Admission medications   Medication Sig Start Date End Date Taking? Authorizing Provider  acetaminophen (TYLENOL) 325 MG tablet Take 325-650 mg by mouth every 6 (six) hours as needed for headache.   Yes [provider]  albuterol (PROVENTIL) (2.5 MG/3ML) 0.083% nebulizer solution USE 1 VIAL (3 ML) VIA NEBULIZER EVERY 6 HOURS AS NEEDED FOR WHEEZING OR SHORTNESS OF BREATH 02/11/17  Yes [name], MD  AMBULATORY NON FORMULARY MEDICATION Medication Name: GI cocktail: 178mL Lidocaine2%, 155ml Maalox, 24ml Donnatol. Ok to take twice daily. 08/14/17  Yes [name], MD  BREO ELLIPTA 100-25 MCG/INH AEPB USE 1 INHALATION DAILY 10/03/17  Yes [name], MD  clopidogrel (PLAVIX) 75 MG tablet TAKE 1 TABLET DAILY 01/22/18  Yes [name], MD  docusate sodium (COLACE) 100 MG capsule Take 1 capsule (100 mg total) by mouth daily. 09/22/17 09/22/18 Yes Lavina Hamman, MD  dronabinol (MARINOL) 5 MG capsule Take 1 capsule (5 mg total) by mouth 2  (two) times daily. 11/20/17  Yes [name], MD  feeding supplement (BOOST / RESOURCE BREEZE) LIQD Take 1 Container by mouth 2 (two) times daily between meals. Chocolate boost   Yes [provider]  guaiFENesin (MUCINEX) 600 MG 12 hr tablet Take 1 tablet (600 mg total) by mouth 2 (two) times daily. 04/03/16  Yes Barton Dubois, MD  HYDROcodone-acetaminophen [facility]) 10-325 MG tablet Take 1 tablet by mouth every 6 (six) hours as needed. Patient taking differently: Take 1 tablet by mouth every 6 (six) hours as needed for moderate pain.  01/20/18  Yes [name], MD  INCRUSE ELLIPTA 62.5 MCG/INH AEPB USE 1 INHALATION DAILY 07/25/17  Yes [name], MD  lidocaine (XYLOCAINE) 2 % solution Use as directed 20 mLs in the mouth or throat 2 (two) times daily as needed. Patient taking differently: Use as directed 20 mLs in the mouth or throat 2 (two) times daily as needed for mouth pain.  04/17/17  Yes [name], MD  losartan (COZAAR) 100 MG tablet Take 1 tablet (100 mg total) by mouth daily. 11/12/17  Yes [name], MD  metoprolol tartrate (LOPRESSOR) 50 MG tablet Take 1 tablet (50 mg total) by mouth 2 (two) times daily. 08/05/17  Yes [name], MD  Multiple Vitamin (MULTIVITAMIN WITH MINERALS) TABS tablet Take 1 tablet by mouth daily. Pt uses One-A-Day brand   Yes [provider]  omeprazole (PRILOSEC) 40 MG capsule Take 1 capsule (40 mg total) by mouth 2 (two) times daily. 10/30/17  Yes [name], MD  ondansetron (ZOFRAN-ODT) 8 MG disintegrating tablet PLACE 1 TABLET ON THE TONGUE EVERY 8 HOURS AS NEEDED FOR NAUSEA 08/09/17  Yes [name], MD  polyethylene glycol (MIRALAX / GLYCOLAX) packet Take 17 g by mouth daily. 07/28/17  Yes Rosita Fire, MD  predniSONE (DELTASONE) 10 MG tablet Take 1 tablet (10 mg total) by mouth daily with breakfast. 11/20/17  Yes [name], MD  promethazine (PHENERGAN) 25  MG tablet Take 1 tablet (25 mg total) by mouth every 8 (eight) hours as needed for nausea or vomiting. 09/02/17  Yes [name], MD  ranitidine (ZANTAC) 150 MG tablet Take 1 tablet (150 mg total) by mouth daily. 01/03/17  Yes [name], MD  traMADol (ULTRAM) 50 MG tablet Take 1 tablet (50 mg total) by mouth every 6 (six) hours as needed (pain). 11/12/17  Yes [name], MD  AMBULATORY NON FORMULARY MEDICATION Medication Name: Needs oxygen concentrator set to 6 liter per minutes as she has a long tubing on her machine.  Fax to [facility] Patient taking differently: Medication Name: Needs oxygen concentrator set to 8 liter per minutes as she has a long tubing on her machine.  Fax to [name] 08/27/16   [name], MD  Nutritional Supplements (FEEDING SUPPLEMENT, BOOST BREEZE,) LIQD Take 1 Can by mouth 3 (three) times daily. Patient not taking: Reported on 02/06/2018 09/22/17   Lavina Hamman, MD  sucralfate (CARAFATE) 1 GM/10ML suspension Take 10 mLs (1 g total) by mouth 4 (four) times daily -  with meals and at bedtime. Patient not taking: Reported on 01/27/2018 12/11/17   [name], MD   Allergies  Allergen Reactions  .  Tyloxapol Itching  . Maxidex [Dexamethasone] Other (See Comments)    DEHYDRATION AND HEART RACING  . Advair Diskus [Fluticasone-Salmeterol] Other (See Comments)    No benefit with lungs (INEFFECTIVE)  . Remeron [Mirtazapine] Other (See Comments)    CAUSED NIGHTMARES  . Trazodone And Nefazodone Other (See Comments)    Heart pounding  . Tylox [Oxycodone-Acetaminophen] Itching    FAMILY HISTORY:  family history includes Glaucoma in her mother and sister; Hypertension in her mother; Ovarian cancer in her mother; Pancreatic cancer in her maternal uncle. SOCIAL HISTORY:  reports that she quit smoking about 2 years ago. Her smoking use included cigarettes. She has a 14.25 pack-year smoking history. She has never used smokeless tobacco. She  reports that she does not drink alcohol or use drugs.  REVIEW OF SYSTEMS:  ( bolded items are pertinent positives) Constitutional: Negative for fever, chills, weight loss, malaise/fatigue and diaphoresis.  HENT: Negative for hearing loss, ear pain, nosebleeds, congestion, sore throat, neck pain, tinnitus and ear discharge.   Eyes: Negative for blurred vision, double vision, photophobia, pain, discharge and redness.  Respiratory: Negative for cough, hemoptysis, sputum production, shortness of breath, wheezing and stridor.   Cardiovascular: Negative for chest pain, palpitations, orthopnea, claudication, leg swelling and PND.  Gastrointestinal: Negative for heartburn, nausea, vomiting, abdominal pain, diarrhea, constipation, blood in stool and melena.  Genitourinary: Negative for dysuria, urgency, frequency, hematuria and flank pain.  Musculoskeletal: Negative for myalgias, back pain, joint pain and falls.  Skin: Negative for itching and rash.  Neurological: Negative for dizziness, tingling, tremors, sensory change, speech change, focal weakness, seizures, loss of consciousness, weakness and headaches.  Endo/Heme/Allergies: Negative for environmental allergies and polydipsia. Does not bruise/bleed easily.  SUBJECTIVE:   VITAL SIGNS: Temp:  [98.3 F (36.8 C)] 98.3 F (36.8 C) (07/18 1506) Pulse Rate:  [68-75] 75 (07/18 2314) Resp:  [13-20] 13 (07/18 2314) BP: (155-199)/(63-79) 167/77 (07/18 2314) SpO2:  [96 %-100 %] 100 % (07/18 2314) Weight:  [44.9 kg (99 lb)] 44.9 kg (99 lb) (07/18 1506)  PHYSICAL EXAMINATION: General:  Alert awake in mild discomfort but not in distress Neuro:  GCS 15 no focal deficits AAOx4 and appropiate HEENT:  NCAT, nasal cannula in nares Cardiovascular:  S1 and S2 appreciated Lungs:  Coarse breath sounds bilaterally Abdomen:  Soft, tender in RUQ,LUQ  hypoactive BS Musculoskeletal:  No gross deformities Skin:  Grossly intact, dry and warm  Recent Labs  Lab  02/06/18 1656  NA 133*  K 4.3  CL 93*  CO2 29  BUN 8  CREATININE 0.73  GLUCOSE 101*   Recent Labs  Lab 02/06/18 1656  HGB 12.1  HCT 36.9  WBC 10.9*  PLT 325   Ct Abdomen Pelvis W Contrast  Result Date: 02/06/2018 CLINICAL DATA:  Generalized abdominal pain for the past 3 days. EXAM: CT ABDOMEN AND PELVIS WITH CONTRAST TECHNIQUE: Multidetector CT imaging of the abdomen and pelvis was performed using the standard protocol following bolus administration of intravenous contrast. CONTRAST:  170mL ISOVUE-300 IOPAMIDOL (ISOVUE-300) INJECTION 61% COMPARISON:  PET-CT dated January 08, 2018. CT abdomen pelvis dated July 18, 2017. FINDINGS: Lower chest: No acute abnormality. Hepatobiliary: Small hypodense lesions in the left and right hepatic lobes measuring up to 1.1 cm are unchanged, likely small cysts. There is a 1.3 cm discontinuous arterially enhancing lesion in the inferior right hepatic lobe which demonstrates enhancement on delayed images, consistent with a hemangioma. Status post cholecystectomy. Unchanged mild central intrahepatic biliary dilatation. Pancreas: Unremarkable. Prominence of the  main pancreatic duct is similar to prior studies. No surrounding inflammatory changes. Spleen: Normal in size without focal abnormality. Adrenals/Urinary Tract: Adrenal glands are unremarkable. Kidneys are normal, without renal calculi, focal lesion, or hydronephrosis. Bladder is moderately distended. Stomach/Bowel: Appendix is fluid-filled and dilated, containing several appendicoliths, with mild periappendiceal inflammatory changes. Appendix: Location: Medial to the cecum coursing posteriorly. Diameter: 11 mm. Appendicolith: One at the tip and one at the base. Mucosal hyper-enhancement: Present. Extraluminal gas: None. Periappendiceal collection: None. Stomach is within normal limits. No evidence of bowel wall thickening, distention, or inflammatory changes. Left-sided colonic diverticulosis.  Vascular/Lymphatic: Aortic atherosclerosis. No enlarged abdominal or pelvic lymph nodes. Reproductive: Status post hysterectomy. No adnexal masses. Other: Small bilateral fat containing inguinal hernias. No free fluid or pneumoperitoneum. Musculoskeletal: No acute or significant osseous findings. Unchanged bilateral L5 pars defects. IMPRESSION: 1. Acute appendicitis.  No perforation or abscess. 2.  Aortic atherosclerosis (ICD10-I70.0). Electronically Signed   By: Titus Dubin M.D.   On: 02/06/2018 18:25   Dg Chest Port 1 View  Result Date: 02/06/2018 CLINICAL DATA:  Preop for appendicitis.  Dyspnea. EXAM: PORTABLE CHEST 1 VIEW COMPARISON:  07/24/2017 CXR, PET-CT 01/08/2018 FINDINGS: Ill-defined left apical opacity is noted that may represent stereotactic body radiation therapy change for known metabolically active left apical pulmonary nodule as seen on prior PET-CT. Emphysematous hyperinflation of the lungs without acute pulmonary consolidation or CHF. No effusion is identified though the right lateral costophrenic angle is minimally excluded. Heart size is within normal limits. Moderate aortic atherosclerosis is seen. IMPRESSION: Emphysematous hyperinflation of the lungs with post treatment change felt to account for the ill-defined opacity at the left lung apex post SBRT. No acute pulmonary consolidation suspicious for pneumonia. Electronically Signed   By: Ashley Royalty M.D.   On: 02/06/2018 23:12    SLEEP STUDY 06/29/16: IMPRESSIONS - No significant obstructive sleep apnea occurred during this study (AHI = 0.3/h). - No significant central sleep apnea occurred during this study (CAI = 0.0/h). - Mild oxygen desaturation was noted during this study (Min O2 = 98.00%). Study recorded on 4.5 L/M O2 - home dose, per protocol. - No snoring was audible during this study. - EKG findings include PVCs. - Clinically significant periodic limb movements did not occur during sleep. No significant associated  arousals.   ASSESSMENT / PLAN: 1. Acute Appendicitis without perforation- pt has h/o SBO and tubular adenoma- not an optimal surgical candidate She has a high risk of difficult extubation. Pt has had several conversations regarding this with her PCP and she does not want to be intubated. She also  Expressed that she does not want CPR or ACLS meds should her heart stop because her quality of life would be poor.  Her sister was at the bedside during this conversation. Pt states she has an advanced directive but is not sure how specific it is and would like assistance with this so it is not reversed should she be incapable to speak for herself.   She also would like a consult to Palliative care to discuss comfort care options should the non invasive strategy to Rx appendicitis with antibiotics not work.  2. Severe COPD:  end of life conversations were in progress with her PCP prior to this visit as the patient was experiencing worsening in her breathing since RT per last documentation by Radiation Oncology on 01/27/18.  We had an extensive conversation regarding her current clinical condition and that she would need to be intubated for any surgical  procedure. She expressed clearly that she does not want to be intubated given the risk that she may be ventilator dept for the remainder of her life.     DISPOSITION: Admit to Hospitalist Start on IV Antibiotics PROGNOSIS: Poor CONSULTS: Surgery ( by EDP), Palliative Care and Case Mgmt CODE STATUS: DNR DNI FAMILY: sister at bedside was a part of the conversation and understands the patient's wishes. Both the patient and sister asked several questions and expressed understanding regarding plan of care.   I, Dr Seward Carol have personally reviewed patient's available data, including medical history, events of note, physical examination and test results as part of my evaluation. I have discussed with resident/NP and other care providers such as pharmacist,  RN and RRT. The patient is critically ill and requires high complexity decision making for assessment and support, frequent evaluation and titration of therapies, application of advanced monitoring technologies and extensive interpretation of multiple databases. She is not a candidate for intubation. Critical Care Time devoted to patient care services described in this note is 68 Minutes. This time reflects time of care of this signee Dr Seward Carol. This critical care time does not reflect procedure time, or teaching time or supervisory time but could involve care discussion time     Signed Dr Seward Carol Pulmonary Critical Care Locums  02/06/2018, 11:32 PM

## 2018-02-06 NOTE — ED Provider Notes (Signed)
[facility] EMERGENCY DEPARTMENT Provider Note   CSN: [id number] Arrival date & time: 02/06/18  1458     History   Chief Complaint Chief Complaint  Patient presents with  . Abdominal Pain  . Shortness of Breath    HPI Tamara Adams is a 77 y.o. female.  Patient is a 77 year old female with history of COPD, hypertension, recurrent bowel obstructions, and chronic abdominal pain.  She presents with a 3-day history of worsening abdominal pain.  She reports nausea but no vomiting.  She denies any fevers or chills.  She does report loose stool yesterday.  She had a bowel movement this morning that was nonbloody.  She denies any urinary complaints.  This feels similar to prior obstructions.  She has had multiple abdominal surgeries including cholecystectomy, hysterectomy, and surgery for small bowel obstruction.  The history is provided by the patient.  Abdominal Pain   This is a chronic problem. Episode onset: Years ago. The problem occurs constantly. The problem has been rapidly worsening. The pain is associated with an unknown factor. The pain is located in the epigastric region, RUQ and LUQ. The quality of the pain is cramping. The pain is severe. Associated symptoms include nausea. Pertinent negatives include fever, hematochezia, vomiting and constipation. The symptoms are aggravated by certain positions and eating. Nothing relieves the symptoms.  Shortness of Breath  Associated symptoms include abdominal pain. Pertinent negatives include no fever and no vomiting.    Past Medical History:  Diagnosis Date  . Anemia    as a child  . Arthritis    "hands" (12/24/2014)  . Complication of anesthesia    " My blood gas dropped during surgery so I was left on a ventilator and in ICU for 3 days."  . COPD (chronic obstructive pulmonary disease) (HCC)    FVC 72%, FEV1 29%, FEV1 ratio 32% (very severe (COPD)  . COPD (chronic obstructive pulmonary disease) ([place])   . Degenerative disc  disease   . Depression with anxiety   . Diverticulosis   . DVT (deep venous thrombosis) ([place])    "LLE; years ago after a surgery"  . Essential hypertension   . Fluttering sensation of heart    pt put on metoprolol as a result  . GERD (gastroesophageal reflux disease)    PMH  . H/O hiatal hernia   . Headache    "maybe weekly" (12/24/2014)  . Hyperlipidemia   . Hypertension   . Kidney stone   . Liver lesion   . MI (myocardial infarction) ([place]) 07/19/2017  . On home oxygen therapy    "3L; sleep w/it; use it when I rest" (12/24/2014)  . Peptic ulcer   . Pinched nerve    in back  . Pneumonia   . PONV (postoperative nausea and vomiting)   . Shortness of breath dyspnea    with exertion  . Skipped heart beats   . Small bowel obstruction ([place])    "several times; OR twice" (12/24/2014)  . Tubular adenoma of colon 09/2011  . Wears glasses     Patient Active Problem List   Diagnosis Date Noted  . Pressure injury of skin 09/21/2017  . Acute and chronic respiratory failure with hypoxia ([place]) 09/21/2017  . Chest pain 09/20/2017  . Nodule of left lung 08/29/2017  . IDA (iron deficiency anemia) 08/27/2017  . Non-ST elevation (NSTEMI) myocardial infarction ([place]) 08/07/2017  . Paroxysmal atrial fibrillation (HCC)   . Encounter for nasogastric (NG) tube placement   . SBO (  small bowel obstruction) ([place]) 07/19/2017  . Caregiver burden 04/16/2017  . IFG (impaired fasting glucose) 10/08/2016  . Chronic pain syndrome 07/25/2016  . Physical deconditioning   . Hyponatremia   . SOB (shortness of breath)   . Constipation 03/25/2016  . Barrett's esophagus without dysplasia 12/15/2015  . Ileus ([place]) 12/02/2015  . Hypokalemia 12/02/2015  . Essential hypertension   . Esophageal reflux   . Depression with anxiety   . Generalized abdominal pain   . Leukocytosis 01/12/2015  . Aortic atherosclerosis ([place]) 10/05/2014  . Abnormal CT of liver 02/05/2014  . Severe protein-calorie malnutrition ([place])  01/29/2014  . Small bowel obstruction ([place]) 01/28/2014  . Chronic hypoxemic respiratory failure ([place]) 12/07/2013  . Lumbar degenerative disc disease 05/28/2013  . Gastric ulcer 04/22/2013  . Other malaise and fatigue 10/09/2012  . CHEST PAIN, PRECORDIAL 10/04/2010  . HLD (hyperlipidemia) 09/14/2010  . History of tobacco abuse 09/14/2010  . COPD (chronic obstructive pulmonary disease) ([place]) 09/14/2010  . Insomnia 09/14/2010  . PALPITATIONS 09/14/2010  . CAROTID BRUIT 09/14/2010    Past Surgical History:  Procedure Laterality Date  . ABDOMINAL ADHESION SURGERY  12/24/2014  . ABDOMINAL HYSTERECTOMY  1987   and one ovary  . CATARACT EXTRACTION W/ INTRAOCULAR LENS  IMPLANT, BILATERAL Right 06/22/2012   Dr. Lester Kinsman.   Marland Kitchen CATARACT EXTRACTION W/PHACO Left 06/12/2012   Dr. Boykin Reaper  . CHOLECYSTECTOMY N/A 08/18/2013   Procedure: LAPAROSCOPIC CHOLECYSTECTOMY;  Surgeon: Harl Bowie, MD;  Location: [facility];  Service: General;  Laterality: N/A;  . DILATION AND CURETTAGE OF UTERUS    . EXPLORATORY LAPAROTOMY  12/24/2014  . HERNIA REPAIR    . INCISIONAL HERNIA REPAIR  12/24/2014  . INCISIONAL HERNIA REPAIR N/A 12/24/2014   Procedure: HERNIA REPAIR INCISIONAL;  Surgeon: Coralie Keens, MD;  Location: [facility];  Service: General;  Laterality: N/A;  . LAPAROTOMY N/A 12/24/2014   Procedure: EXPLORATORY LAPAROTOMY;  Surgeon: Coralie Keens, MD;  Location: [facility];  Service: General;  Laterality: N/A;  . LYSIS OF ADHESION N/A 12/24/2014   Procedure: LYSIS OF ADHESION;  Surgeon: Coralie Keens, MD;  Location: [facility];  Service: General;  Laterality: N/A;  . MULTIPLE TOOTH EXTRACTIONS    . OOPHORECTOMY  1992  . SMALL INTESTINE SURGERY     for a blockage, no bowel was removed, just kinked from scar tissue  . TUBAL LIGATION    . URETHRAL DILATION       OB History   None      Home Medications    Prior to Admission medications   Medication Sig Start Date End Date Taking? Authorizing Provider    acetaminophen (TYLENOL) 325 MG tablet Take 325-650 mg by mouth every 6 (six) hours as needed for headache.    [provider]  albuterol (PROVENTIL) (2.5 MG/3ML) 0.083% nebulizer solution USE 1 VIAL (3 ML) VIA NEBULIZER EVERY 6 HOURS AS NEEDED FOR WHEEZING OR SHORTNESS OF BREATH 02/11/17   [name], MD  AMBULATORY NON FORMULARY MEDICATION Medication Name: Needs oxygen concentrator set to 6 liter per minutes as she has a long tubing on her machine.  Fax to [facility] Patient taking differently: Medication Name: Needs oxygen concentrator set to 8 liter per minutes as she has a long tubing on her machine.  Fax to [name] 08/27/16   [name], MD  AMBULATORY NON FORMULARY MEDICATION Medication Name: GI cocktail: 154mL Lidocaine2%, 129ml Maalox, 84ml Donnatol. Ok to take twice daily. 08/14/17   [name],  MD  BREO ELLIPTA 100-25 MCG/INH AEPB USE 1 INHALATION DAILY 10/03/17   [name], MD  clopidogrel (PLAVIX) 75 MG tablet TAKE 1 TABLET DAILY 01/22/18   [name], MD  docusate sodium (COLACE) 100 MG capsule Take 1 capsule (100 mg total) by mouth daily. 09/22/17 09/22/18  Lavina Hamman, MD  dronabinol (MARINOL) 5 MG capsule Take 1 capsule (5 mg total) by mouth 2 (two) times daily. 11/20/17   [name], MD  guaiFENesin (MUCINEX) 600 MG 12 hr tablet Take 1 tablet (600 mg total) by mouth 2 (two) times daily. 04/03/16   Barton Dubois, MD  HYDROcodone-acetaminophen [facility]) 10-325 MG tablet Take 1 tablet by mouth every 6 (six) hours as needed. 01/20/18   [name], MD  INCRUSE ELLIPTA 62.5 MCG/INH AEPB USE 1 INHALATION DAILY 07/25/17   [name], MD  lidocaine (XYLOCAINE) 2 % solution Use as directed 20 mLs in the mouth or throat 2 (two) times daily as needed. 04/17/17   [name], MD  losartan (COZAAR) 100 MG tablet Take 1 tablet (100 mg total) by mouth daily. 11/12/17   [name], MD  metoprolol tartrate  (LOPRESSOR) 50 MG tablet Take 1 tablet (50 mg total) by mouth 2 (two) times daily. 08/05/17   [name], MD  Multiple Vitamin (MULTIVITAMIN WITH MINERALS) TABS tablet Take 1 tablet by mouth daily. Pt uses One-A-Day brand    [provider]  Nutritional Supplements (FEEDING SUPPLEMENT, BOOST BREEZE,) LIQD Take 1 Can by mouth 3 (three) times daily. 09/22/17   Lavina Hamman, MD  omeprazole (PRILOSEC) 40 MG capsule Take 1 capsule (40 mg total) by mouth 2 (two) times daily. 10/30/17   [name], MD  ondansetron (ZOFRAN-ODT) 8 MG disintegrating tablet PLACE 1 TABLET ON THE TONGUE EVERY 8 HOURS AS NEEDED FOR NAUSEA 08/09/17   [name], MD  polyethylene glycol (MIRALAX / GLYCOLAX) packet Take 17 g by mouth daily. 07/28/17   Rosita Fire, MD  predniSONE (DELTASONE) 10 MG tablet Take 1 tablet (10 mg total) by mouth daily with breakfast. 11/20/17   [name], MD  promethazine (PHENERGAN) 25 MG tablet Take 1 tablet (25 mg total) by mouth every 8 (eight) hours as needed for nausea or vomiting. 09/02/17   [name], MD  ranitidine (ZANTAC) 150 MG tablet Take 1 tablet (150 mg total) by mouth daily. 01/03/17   [name], MD  sucralfate (CARAFATE) 1 GM/10ML suspension Take 10 mLs (1 g total) by mouth 4 (four) times daily -  with meals and at bedtime. Patient not taking: Reported on 01/27/2018 12/11/17   [name], MD  traMADol (ULTRAM) 50 MG tablet Take 1 tablet (50 mg total) by mouth every 6 (six) hours as needed (pain). 11/12/17   [name], MD    Family History Family History  Problem Relation Age of Onset  . Hypertension Mother   . Ovarian cancer Mother   . Glaucoma Mother   . Glaucoma Sister   . Pancreatic cancer Maternal Uncle   . Colon cancer Neg Hx   . Colon polyps Neg Hx   . Diabetes Neg Hx   . Kidney disease Neg Hx   . Gallbladder disease Neg Hx   . Esophageal cancer Neg Hx     Social  History Social History   Tobacco Use  . Smoking status: Former Smoker    Packs/day: 0.25    Years:  57.00    Pack years: 14.25    Types: Cigarettes    Last attempt to quit: 07/25/2015    Years since quitting: 2.5  . Smokeless tobacco: Never Used  . Tobacco comment: 5 cigarettes a day  Substance Use Topics  . Alcohol use: No    Alcohol/week: 0.0 oz  . Drug use: No     Allergies   Tyloxapol; Maxidex [dexamethasone]; Advair diskus [fluticasone-salmeterol]; Remeron [mirtazapine]; Trazodone and nefazodone; and Tylox [oxycodone-acetaminophen]   Review of Systems Review of Systems  Constitutional: Negative for fever.  Respiratory: Positive for shortness of breath.   Gastrointestinal: Positive for abdominal pain and nausea. Negative for constipation, hematochezia and vomiting.  All other systems reviewed and are negative.    Physical Exam Updated Vital Signs BP (!) 196/75 (BP Location: Right Arm)   Pulse 70   Temp 98.3 F (36.8 C) (Oral)   Resp 20   Ht 5' 3.5" (1.613 m)   Wt 44.9 kg (99 lb)   SpO2 100%   BMI 17.26 kg/m   Physical Exam  Constitutional: She is oriented to person, place, and time. She appears well-developed and well-nourished. No distress.  HENT:  Head: Normocephalic and atraumatic.  Neck: Normal range of motion. Neck supple.  Cardiovascular: Normal rate and regular rhythm. Exam reveals no gallop and no friction rub.  No murmur heard. Pulmonary/Chest: Effort normal and breath sounds normal. No respiratory distress. She has no wheezes.  Abdominal: Soft. Bowel sounds are normal. She exhibits no distension. There is tenderness in the right upper quadrant, epigastric area and left upper quadrant. There is no rigidity, no rebound and no guarding.  Musculoskeletal: Normal range of motion.  Neurological: She is alert and oriented to person, place, and time.  Skin: Skin is warm and dry. She is not diaphoretic.  Nursing note and vitals reviewed.    ED  Treatments / Results  Labs (all labs ordered are listed, but only abnormal results are displayed) Labs Reviewed  COMPREHENSIVE METABOLIC PANEL  LIPASE, BLOOD  CBC WITH DIFFERENTIAL/PLATELET    EKG None  Radiology No results found.  Procedures Procedures (including critical care time)  Medications Ordered in ED Medications  HYDROmorphone (DILAUDID) injection 1 mg (has no administration in time range)  ondansetron (ZOFRAN) injection 4 mg (has no administration in time range)  sodium chloride 0.9 % bolus 1,000 mL (has no administration in time range)     Initial Impression / Assessment and Plan / ED Course  I have reviewed the triage vital signs and the nursing notes.  Pertinent labs & imaging results that were available during my care of the patient were reviewed by me and considered in my medical decision making (see chart for details).  CT scan shows acute appendicitis.  This finding was discussed with Dr. Marcello Moores from general surgery who is accepted the patient in transfer to [facility].  She will go as an ER to ER transfer.  Dr. Darl Householder in the emergency department made aware of the patient's impending transfer.  She was given Rocephin and Flagyl.  Final Clinical Impressions(s) / ED Diagnoses   Final diagnoses:  None    ED Discharge Orders    None       Veryl Speak, MD 02/07/18 2358

## 2018-02-06 NOTE — ED Triage Notes (Signed)
Patient has been complaining of Abdominal pain for 3 days.

## 2018-02-06 NOTE — H&P (Signed)
Tamara Adams is an 77 y.o. female.   Chief Complaint: [name] HPI: 77 y.o. F with very severe COPD who presented to [facility] with 3-4 days of acute on chronic abd pain.  No vomiting or fevers.  BL O2 is 8L per minute.  She has a h/o multiple abd surgeries in the past for hysterectomy and SBO's.     Past Medical History:  Diagnosis Date  . Anemia    as a child  . Arthritis    "hands" (12/24/2014)  . Complication of anesthesia    " My blood gas dropped during surgery so I was left on a ventilator and in ICU for 3 days."  . COPD (chronic obstructive pulmonary disease) (HCC)    FVC 72%, FEV1 29%, FEV1 ratio 32% (very severe (COPD)  . COPD (chronic obstructive pulmonary disease) ([place])   . Degenerative disc disease   . Depression with anxiety   . Diverticulosis   . DVT (deep venous thrombosis) ([place])    "LLE; years ago after a surgery"  . Essential hypertension   . Fluttering sensation of heart    pt put on metoprolol as a result  . GERD (gastroesophageal reflux disease)    PMH  . H/O hiatal hernia   . Headache    "maybe weekly" (12/24/2014)  . Hyperlipidemia   . Hypertension   . Kidney stone   . Liver lesion   . MI (myocardial infarction) ([place]) 07/19/2017  . On home oxygen therapy    "3L; sleep w/it; use it when I rest" (12/24/2014)  . Peptic ulcer   . Pinched nerve    in back  . Pneumonia   . PONV (postoperative nausea and vomiting)   . Shortness of breath dyspnea    with exertion  . Skipped heart beats   . Small bowel obstruction ([place])    "several times; OR twice" (12/24/2014)  . Tubular adenoma of colon 09/2011  . Wears glasses     Past Surgical History:  Procedure Laterality Date  . ABDOMINAL ADHESION SURGERY  12/24/2014  . ABDOMINAL HYSTERECTOMY  1987   and one ovary  . CATARACT EXTRACTION W/ INTRAOCULAR LENS  IMPLANT, BILATERAL Right 06/22/2012   Dr. Lester Kinsman.   Marland Kitchen CATARACT EXTRACTION W/PHACO Left 06/12/2012   Dr. Boykin Reaper  . CHOLECYSTECTOMY N/A 08/18/2013   Procedure:  LAPAROSCOPIC CHOLECYSTECTOMY;  Surgeon: Harl Bowie, MD;  Location: [facility];  Service: General;  Laterality: N/A;  . DILATION AND CURETTAGE OF UTERUS    . EXPLORATORY LAPAROTOMY  12/24/2014  . HERNIA REPAIR    . INCISIONAL HERNIA REPAIR  12/24/2014  . INCISIONAL HERNIA REPAIR N/A 12/24/2014   Procedure: HERNIA REPAIR INCISIONAL;  Surgeon: Coralie Keens, MD;  Location: [facility];  Service: General;  Laterality: N/A;  . LAPAROTOMY N/A 12/24/2014   Procedure: EXPLORATORY LAPAROTOMY;  Surgeon: Coralie Keens, MD;  Location: [facility];  Service: General;  Laterality: N/A;  . LYSIS OF ADHESION N/A 12/24/2014   Procedure: LYSIS OF ADHESION;  Surgeon: Coralie Keens, MD;  Location: [facility];  Service: General;  Laterality: N/A;  . MULTIPLE TOOTH EXTRACTIONS    . OOPHORECTOMY  1992  . SMALL INTESTINE SURGERY     for a blockage, no bowel was removed, just kinked from scar tissue  . TUBAL LIGATION    . URETHRAL DILATION      Family History  Problem Relation Age of Onset  . Hypertension Mother   . Ovarian cancer Mother   . Glaucoma Mother   .  Glaucoma Sister   . Pancreatic cancer Maternal Uncle   . Colon cancer Neg Hx   . Colon polyps Neg Hx   . Diabetes Neg Hx   . Kidney disease Neg Hx   . Gallbladder disease Neg Hx   . Esophageal cancer Neg Hx    Social History:  reports that she quit smoking about 2 years ago. Her smoking use included cigarettes. She has a 14.25 pack-year smoking history. She has never used smokeless tobacco. She reports that she does not drink alcohol or use drugs.  Allergies:  Allergies  Allergen Reactions  . Tyloxapol Itching  . Maxidex [Dexamethasone] Other (See Comments)    DEHYDRATION AND HEART RACING  . Advair Diskus [Fluticasone-Salmeterol] Other (See Comments)    No benefit with lungs (INEFFECTIVE)  . Remeron [Mirtazapine] Other (See Comments)    CAUSED NIGHTMARES  . Trazodone And Nefazodone Other (See Comments)    Heart pounding  . Tylox  [Oxycodone-Acetaminophen] Itching     (Not in a hospital admission)  Results for orders placed or performed during the hospital encounter of 02/06/18 (from the past 48 hour(s))  Comprehensive metabolic panel     Status: Abnormal   Collection Time: 02/06/18  4:56 PM  Result Value Ref Range   Sodium 133 (L) 135 - 145 mmol/L   Potassium 4.3 3.5 - 5.1 mmol/L   Chloride 93 (L) 98 - 111 mmol/L    Comment: Please note change in reference range.   CO2 29 22 - 32 mmol/L   Glucose, Bld 101 (H) 70 - 99 mg/dL    Comment: Please note change in reference range.   BUN 8 8 - 23 mg/dL    Comment: Please note change in reference range.   Creatinine, Ser 0.73 0.44 - 1.00 mg/dL   Calcium 8.6 (L) 8.9 - 10.3 mg/dL   Total Protein 6.8 6.5 - 8.1 g/dL   Albumin 3.6 3.5 - 5.0 g/dL   AST 21 15 - 41 U/L   ALT 12 0 - 44 U/L    Comment: Please note change in reference range.   Alkaline Phosphatase 72 38 - 126 U/L   Total Bilirubin 0.2 (L) 0.3 - 1.2 mg/dL   GFR calc non Af Amer >60 >60 mL/min   GFR calc Af Amer >60 >60 mL/min    Comment: (NOTE) The eGFR has been calculated using the CKD EPI equation. This calculation has not been validated in all clinical situations. eGFR's persistently <60 mL/min signify possible Chronic Kidney Disease.    Anion gap 11 5 - 15    Comment: Performed at [facility], [address] [place], [place] [address]  Lipase, blood     Status: None   Collection Time: 02/06/18  4:56 PM  Result Value Ref Range   Lipase 33 11 - 51 U/L    Comment: Performed at [facility], [address] [place], [place] [address]  CBC with Differential     Status: Abnormal   Collection Time: 02/06/18  4:56 PM  Result Value Ref Range   WBC 10.9 (H) 4.0 - 10.5 K/uL   RBC 3.93 3.87 - 5.11 MIL/uL   Hemoglobin 12.1 12.0 - 15.0 g/dL   HCT 36.9 36.0 - 46.0 %   MCV 93.9 78.0 - 100.0 fL   MCH 30.8 26.0 - 34.0 pg   MCHC 32.8 30.0 - 36.0 g/dL   RDW 12.3 11.5 - 15.5 %    Platelets 325 150 -  400 K/uL   Neutrophils Relative % 63 %   Neutro Abs 6.9 1.7 - 7.7 K/uL   Lymphocytes Relative 15 %   Lymphs Abs 1.6 0.7 - 4.0 K/uL   Monocytes Relative 10 %   Monocytes Absolute 1.1 (H) 0.1 - 1.0 K/uL   Eosinophils Relative 11 %   Eosinophils Absolute 1.2 (H) 0.0 - 0.7 K/uL   Basophils Relative 1 %   Basophils Absolute 0.1 0.0 - 0.1 K/uL    Comment: Performed at [facility], [address] [place], [place] [address]   Ct Abdomen Pelvis W Contrast  Result Date: 02/06/2018 CLINICAL DATA:  Generalized abdominal pain for the past 3 days. EXAM: CT ABDOMEN AND PELVIS WITH CONTRAST TECHNIQUE: Multidetector CT imaging of the abdomen and pelvis was performed using the standard protocol following bolus administration of intravenous contrast. CONTRAST:  184m ISOVUE-300 IOPAMIDOL (ISOVUE-300) INJECTION 61% COMPARISON:  PET-CT dated January 08, 2018. CT abdomen pelvis dated July 18, 2017. FINDINGS: Lower chest: No acute abnormality. Hepatobiliary: Small hypodense lesions in the left and right hepatic lobes measuring up to 1.1 cm are unchanged, likely small cysts. There is a 1.3 cm discontinuous arterially enhancing lesion in the inferior right hepatic lobe which demonstrates enhancement on delayed images, consistent with a hemangioma. Status post cholecystectomy. Unchanged mild central intrahepatic biliary dilatation. Pancreas: Unremarkable. Prominence of the main pancreatic duct is similar to prior studies. No surrounding inflammatory changes. Spleen: Normal in size without focal abnormality. Adrenals/Urinary Tract: Adrenal glands are unremarkable. Kidneys are normal, without renal calculi, focal lesion, or hydronephrosis. Bladder is moderately distended. Stomach/Bowel: Appendix is fluid-filled and dilated, containing several appendicoliths, with mild periappendiceal inflammatory changes. Appendix: Location: Medial to the cecum coursing posteriorly. Diameter: 11 mm.  Appendicolith: One at the tip and one at the base. Mucosal hyper-enhancement: Present. Extraluminal gas: None. Periappendiceal collection: None. Stomach is within normal limits. No evidence of bowel wall thickening, distention, or inflammatory changes. Left-sided colonic diverticulosis. Vascular/Lymphatic: Aortic atherosclerosis. No enlarged abdominal or pelvic lymph nodes. Reproductive: Status post hysterectomy. No adnexal masses. Other: Small bilateral fat containing inguinal hernias. No free fluid or pneumoperitoneum. Musculoskeletal: No acute or significant osseous findings. Unchanged bilateral L5 pars defects. IMPRESSION: 1. Acute appendicitis.  No perforation or abscess. 2.  Aortic atherosclerosis (ICD10-I70.0). Electronically Signed   By: WTitus DubinM.D.   On: 02/06/2018 18:25    Review of Systems  Constitutional: Negative for chills and fever.  HENT: Negative for hearing loss.   Eyes: Negative for blurred vision.  Respiratory: Positive for cough and shortness of breath.   Cardiovascular: Negative for chest pain and palpitations.  Gastrointestinal: Positive for abdominal pain (RLQ). Negative for nausea and vomiting.  Genitourinary: Negative for dysuria and urgency.  Skin: Negative for itching and rash.  Neurological: Negative for dizziness and headaches.    Blood pressure (!) 155/65, pulse 73, temperature 98.3 F (36.8 C), temperature source Oral, resp. rate 20, height 5' 3.5" (1.613 m), weight 44.9 kg (99 lb), SpO2 100 %. Physical Exam  Constitutional: She is oriented to person, place, and time. She appears well-developed and well-nourished.  HENT:  Head: Normocephalic and atraumatic.  Eyes: Pupils are equal, round, and reactive to light. Conjunctivae and EOM are normal.  Neck: Normal range of motion. Neck supple.  Cardiovascular: Normal rate and regular rhythm.  Respiratory: Effort normal. No respiratory distress.  GI: Soft. She exhibits no distension. There is tenderness.   Musculoskeletal: Normal range of motion.  Neurological: She is alert  and oriented to person, place, and time.     Assessment/Plan 77 y.o. F with very severe COPD and mild appearing appendicitis.  WIll cont with IV abx for now, as surgical treatment would be life threatening.  Hold plavix   Rosario Adie., MD 1/50/4136, 8:14 PM

## 2018-02-06 NOTE — Progress Notes (Signed)
RT note: RT added humidity bottle to  nasal canula with 6L O2 to prevent drying of nasal passages.

## 2018-02-06 NOTE — ED Provider Notes (Signed)
  Physical Exam  BP (!) 162/63   Pulse 71   Temp 98.3 F (36.8 C) (Oral)   Resp 20   Ht 5' 3.5" (1.613 m)   Wt 44.9 kg (99 lb)   SpO2 96%   BMI 17.26 kg/m   Physical Exam  ED Course/Procedures     Procedures  MDM  Patient transferred from [facility] for acute appendicitis. Dr. Stark Jock discussed with Dr. Marcello Moores from surgery, who will see patient. I noticed that she is on 8 L [place]. She states that she has very severe COPD and is on 8 L nasal cannula at baseline.  Been followed up with pulmonary outpatient.  Patient was told that she is a poor intubation candidate and may not get off the vent if she was intubated.  I did talk to Dr. Marcello Moores from surgery and she is in the case currently.  States that she will come down to see the patient.  However, I am concerned for her respiratory status. I talked to Dr. Hal Hope from hospitalist regarding preop clearance. He states that since patient has bad COPD and not cardiac disease, recommend pulmonary critical care to see patient. I called Dr. Lake Bells from pulmonary critical care, who will have someone evaluate patient for preop clearance.       Drenda Freeze, MD 02/06/18 [phone]

## 2018-02-07 ENCOUNTER — Encounter (HOSPITAL_COMMUNITY): Payer: Self-pay | Admitting: Internal Medicine

## 2018-02-07 DIAGNOSIS — Z7189 Other specified counseling: Secondary | ICD-10-CM

## 2018-02-07 DIAGNOSIS — Z789 Other specified health status: Secondary | ICD-10-CM | POA: Insufficient documentation

## 2018-02-07 DIAGNOSIS — Z515 Encounter for palliative care: Secondary | ICD-10-CM

## 2018-02-07 DIAGNOSIS — J9611 Chronic respiratory failure with hypoxia: Secondary | ICD-10-CM

## 2018-02-07 DIAGNOSIS — Z01818 Encounter for other preprocedural examination: Secondary | ICD-10-CM | POA: Insufficient documentation

## 2018-02-07 LAB — BASIC METABOLIC PANEL
ANION GAP: 9 (ref 5–15)
BUN: 9 mg/dL (ref 8–23)
CALCIUM: 8.6 mg/dL — AB (ref 8.9–10.3)
CHLORIDE: 99 mmol/L (ref 98–111)
CO2: 31 mmol/L (ref 22–32)
CREATININE: 0.76 mg/dL (ref 0.44–1.00)
GFR calc non Af Amer: >60 mL/min (ref >60)
Glucose, Bld: 123 mg/dL — ABNORMAL HIGH (ref 70–99)
Potassium: 3.9 mmol/L (ref 3.5–5.1)
SODIUM: 139 mmol/L (ref 135–145)

## 2018-02-07 LAB — CBC
HCT: 33.5 % — ABNORMAL LOW (ref 36.0–46.0)
HEMOGLOBIN: 10.7 g/dL — AB (ref 12.0–15.0)
MCH: 30.6 pg (ref 26.0–34.0)
MCHC: 31.9 g/dL (ref 30.0–36.0)
MCV: 95.7 fL (ref 78.0–100.0)
PLATELETS: 356 10*3/uL (ref 150–400)
RBC: 3.5 MIL/uL — AB (ref 3.87–5.11)
RDW: 13.2 % (ref 11.5–15.5)
WBC: 13.2 10*3/uL — AB (ref 4.0–10.5)

## 2018-02-07 MED ORDER — GUAIFENESIN ER 600 MG PO TB12
600.0000 mg | ORAL_TABLET | Freq: Two times a day (BID) | ORAL | Status: DC
  Administered 2018-02-07 – 2018-02-12 (×12): 600 mg via ORAL
  Filled 2018-02-07 (×12): qty 1

## 2018-02-07 MED ORDER — LOSARTAN POTASSIUM 50 MG PO TABS
100.0000 mg | ORAL_TABLET | Freq: Every day | ORAL | Status: DC
  Administered 2018-02-07 – 2018-02-12 (×6): 100 mg via ORAL
  Filled 2018-02-07 (×7): qty 2

## 2018-02-07 MED ORDER — DIPHENHYDRAMINE HCL 12.5 MG/5ML PO ELIX: 12.5000 mg | ORAL_SOLUTION | Freq: Four times a day (QID) | ORAL | Status: DC | PRN

## 2018-02-07 MED ORDER — LIDOCAINE VISCOUS HCL 2 % MT SOLN
20.0000 mL | Freq: Two times a day (BID) | OROMUCOSAL | Status: DC | PRN
  Filled 2018-02-07: qty 30

## 2018-02-07 MED ORDER — MORPHINE SULFATE (PF) 2 MG/ML IV SOLN
2.0000 mg | INTRAVENOUS | Status: DC | PRN
  Administered 2018-02-07 – 2018-02-11 (×18): 2 mg via INTRAVENOUS
  Filled 2018-02-07 (×18): qty 1

## 2018-02-07 MED ORDER — SODIUM CHLORIDE 0.9 % IV SOLN
2.0000 g | Freq: Two times a day (BID) | INTRAVENOUS | Status: DC
  Filled 2018-02-07: qty 2

## 2018-02-07 MED ORDER — ENOXAPARIN SODIUM 30 MG/0.3ML ~~LOC~~ SOLN
30.0000 mg | SUBCUTANEOUS | Status: DC
  Administered 2018-02-07 – 2018-02-12 (×6): 30 mg via SUBCUTANEOUS
  Filled 2018-02-07 (×6): qty 0.3

## 2018-02-07 MED ORDER — PANTOPRAZOLE SODIUM 40 MG PO TBEC
80.0000 mg | DELAYED_RELEASE_TABLET | Freq: Every day | ORAL | Status: DC
  Administered 2018-02-07 – 2018-02-12 (×6): 80 mg via ORAL
  Filled 2018-02-07 (×6): qty 2

## 2018-02-07 MED ORDER — PIPERACILLIN-TAZOBACTAM 3.375 G IVPB 30 MIN
3.3750 g | Freq: Once | INTRAVENOUS | Status: AC
  Administered 2018-02-07: 3.375 g via INTRAVENOUS
  Filled 2018-02-07: qty 50

## 2018-02-07 MED ORDER — SODIUM CHLORIDE 0.9 % IV SOLN
INTRAVENOUS | Status: DC | PRN
  Administered 2018-02-07: 250 mL via INTRAVENOUS

## 2018-02-07 MED ORDER — ACETAMINOPHEN 325 MG PO TABS
325.0000 mg | ORAL_TABLET | Freq: Four times a day (QID) | ORAL | Status: DC | PRN
Start: 2018-02-07 — End: 2018-02-12
  Administered 2018-02-07 (×2): 650 mg via ORAL
  Filled 2018-02-07 (×2): qty 2

## 2018-02-07 MED ORDER — DRONABINOL 2.5 MG PO CAPS
5.0000 mg | ORAL_CAPSULE | Freq: Two times a day (BID) | ORAL | Status: DC
  Administered 2018-02-07 – 2018-02-12 (×11): 5 mg via ORAL
  Filled 2018-02-07 (×10): qty 2
  Filled 2018-02-07: qty 1

## 2018-02-07 MED ORDER — ONDANSETRON 4 MG PO TBDP: 8.0000 mg | ORAL_TABLET | Freq: Three times a day (TID) | ORAL | Status: DC | PRN

## 2018-02-07 MED ORDER — SODIUM CHLORIDE 0.9 % IV SOLN
1.0000 g | Freq: Two times a day (BID) | INTRAVENOUS | Status: DC
  Administered 2018-02-07 – 2018-02-12 (×11): 1 g via INTRAVENOUS
  Filled 2018-02-07 (×12): qty 1

## 2018-02-07 MED ORDER — ONDANSETRON 4 MG PO TBDP
4.0000 mg | ORAL_TABLET | Freq: Four times a day (QID) | ORAL | Status: DC | PRN
  Administered 2018-02-09 – 2018-02-12 (×4): 4 mg via ORAL
  Filled 2018-02-07 (×4): qty 1

## 2018-02-07 MED ORDER — BOOST PLUS PO LIQD
237.0000 mL | Freq: Three times a day (TID) | ORAL | Status: DC
  Administered 2018-02-10 – 2018-02-12 (×6): 237 mL via ORAL
  Filled 2018-02-07 (×17): qty 237

## 2018-02-07 MED ORDER — SODIUM CHLORIDE 0.9 % IV SOLN
INTRAVENOUS | Status: DC | PRN
Start: 2018-02-07 — End: 2018-02-08
  Administered 2018-02-07 – 2018-02-08 (×2): via INTRAVENOUS

## 2018-02-07 MED ORDER — UMECLIDINIUM BROMIDE 62.5 MCG/INH IN AEPB
1.0000 | INHALATION_SPRAY | Freq: Every day | RESPIRATORY_TRACT | Status: DC
  Administered 2018-02-07 – 2018-02-12 (×6): 1 via RESPIRATORY_TRACT
  Filled 2018-02-07: qty 7

## 2018-02-07 MED ORDER — PROMETHAZINE HCL 25 MG PO TABS
25.0000 mg | ORAL_TABLET | Freq: Three times a day (TID) | ORAL | Status: DC | PRN
  Administered 2018-02-07 – 2018-02-12 (×8): 25 mg via ORAL
  Filled 2018-02-07 (×9): qty 1

## 2018-02-07 MED ORDER — PROMETHAZINE HCL 25 MG PO TABS: 25.0000 mg | ORAL_TABLET | Freq: Three times a day (TID) | ORAL | Status: DC | PRN

## 2018-02-07 MED ORDER — FLUTICASONE FUROATE-VILANTEROL 100-25 MCG/INH IN AEPB
1.0000 | INHALATION_SPRAY | Freq: Every day | RESPIRATORY_TRACT | Status: DC
  Administered 2018-02-07 – 2018-02-12 (×6): 1 via RESPIRATORY_TRACT
  Filled 2018-02-07: qty 28

## 2018-02-07 MED ORDER — METRONIDAZOLE IN NACL 5-0.79 MG/ML-% IV SOLN
500.0000 mg | Freq: Three times a day (TID) | INTRAVENOUS | Status: DC
  Administered 2018-02-07 – 2018-02-12 (×16): 500 mg via INTRAVENOUS
  Filled 2018-02-07 (×16): qty 100

## 2018-02-07 MED ORDER — FAMOTIDINE 20 MG PO TABS
20.0000 mg | ORAL_TABLET | Freq: Every day | ORAL | Status: DC
  Administered 2018-02-07 – 2018-02-12 (×6): 20 mg via ORAL
  Filled 2018-02-07 (×6): qty 1

## 2018-02-07 MED ORDER — DIPHENHYDRAMINE HCL 50 MG/ML IJ SOLN: 12.5000 mg | Freq: Four times a day (QID) | INTRAMUSCULAR | Status: DC | PRN

## 2018-02-07 MED ORDER — ADULT MULTIVITAMIN W/MINERALS CH
1.0000 | ORAL_TABLET | Freq: Every day | ORAL | Status: DC
  Administered 2018-02-07 – 2018-02-12 (×6): 1 via ORAL
  Filled 2018-02-07 (×6): qty 1

## 2018-02-07 MED ORDER — ONDANSETRON HCL 4 MG/2ML IJ SOLN
4.0000 mg | Freq: Four times a day (QID) | INTRAMUSCULAR | Status: DC | PRN
  Administered 2018-02-07 – 2018-02-11 (×7): 4 mg via INTRAVENOUS
  Filled 2018-02-07 (×7): qty 2

## 2018-02-07 MED ORDER — HYDROCODONE-ACETAMINOPHEN 10-325 MG PO TABS
1.0000 | ORAL_TABLET | Freq: Four times a day (QID) | ORAL | Status: DC | PRN
  Administered 2018-02-07 – 2018-02-12 (×13): 1 via ORAL
  Filled 2018-02-07 (×14): qty 1

## 2018-02-07 MED ORDER — PREDNISONE 10 MG PO TABS
10.0000 mg | ORAL_TABLET | Freq: Every day | ORAL | Status: DC
  Administered 2018-02-07 – 2018-02-12 (×6): 10 mg via ORAL
  Filled 2018-02-07 (×7): qty 1

## 2018-02-07 MED ORDER — POLYETHYLENE GLYCOL 3350 17 G PO PACK
17.0000 g | PACK | Freq: Every day | ORAL | Status: DC
  Administered 2018-02-08: 17 g via ORAL
  Filled 2018-02-07 (×3): qty 1

## 2018-02-07 MED ORDER — TRAMADOL HCL 50 MG PO TABS
50.0000 mg | ORAL_TABLET | Freq: Four times a day (QID) | ORAL | Status: DC | PRN
  Administered 2018-02-10 – 2018-02-12 (×5): 50 mg via ORAL
  Filled 2018-02-07 (×7): qty 1

## 2018-02-07 MED ORDER — AMLODIPINE BESYLATE 10 MG PO TABS
10.0000 mg | ORAL_TABLET | Freq: Every day | ORAL | Status: DC
  Administered 2018-02-07 – 2018-02-12 (×6): 10 mg via ORAL
  Filled 2018-02-07 (×6): qty 1

## 2018-02-07 MED ORDER — METRONIDAZOLE IN NACL 5-0.79 MG/ML-% IV SOLN: 500.0000 mg | Freq: Three times a day (TID) | INTRAVENOUS | Status: DC

## 2018-02-07 MED ORDER — METOPROLOL TARTRATE 50 MG PO TABS
50.0000 mg | ORAL_TABLET | Freq: Two times a day (BID) | ORAL | Status: DC
  Administered 2018-02-07 – 2018-02-12 (×12): 50 mg via ORAL
  Filled 2018-02-07 (×12): qty 1

## 2018-02-07 MED ORDER — DOCUSATE SODIUM 100 MG PO CAPS
100.0000 mg | ORAL_CAPSULE | Freq: Every day | ORAL | Status: DC
  Administered 2018-02-07 – 2018-02-12 (×4): 100 mg via ORAL
  Filled 2018-02-07 (×6): qty 1

## 2018-02-07 MED ORDER — BOOST / RESOURCE BREEZE PO LIQD CUSTOM: 1.0000 | Freq: Two times a day (BID) | ORAL | Status: DC

## 2018-02-07 MED ORDER — ALBUTEROL SULFATE (2.5 MG/3ML) 0.083% IN NEBU: 2.5000 mg | INHALATION_SOLUTION | Freq: Four times a day (QID) | RESPIRATORY_TRACT | Status: DC | PRN

## 2018-02-07 NOTE — Consult Note (Signed)
Consultation Note Date: 02/07/2018   Patient Name: Tamara Adams  DOB: [birth year]  MRN: [id number]  Age / Sex: 77 y.o., female  PCP: [name], MD Referring Physician: Edison Pace Md, MD  Reason for Consultation: Establishing goals of care  HPI/Patient Profile: 77 y.o. female admitted on 02/06/2018 from home with acute on chronic abdominal pain for 3-4 days prior to admission.  Patient has a past medical history significant for arthritis, COPD (home oxygen use 8 L nasal cannula), hypertension, GERD, hyperlipidemia, MI (06/2017), IDA, tobacco use, left lung cancer s/p radiation, and small bowel obstruction. On presentation to ER patient complained of right lower quadrant pain with nausea. She denied vomiting or diarrhea. Denied fever or chills. During her ER course CT abdomen showed acute appendicitis. She was seen by Pulmonary critical care and was deemed to be a very high risk for surgery and the plan was made to manager patient conservatively with antibiotics. She continues to be followed by Surgery. Palliative Medicine consulted for goals of care discussion.   Clinical Assessment and Goals of Care: I have reviewed medical records including lab results, imaging, Epic notes, and MAR, received report from the bedside RN, and assessed the patient. I then met at the bedside with Tamara Adams  to discuss diagnosis prognosis, GOC, EOL wishes, disposition and options. She is alert and oriented x3. She complains of some abdominal pain at times. Offered to include other family members in goals of care conversation however patient wanted to have discussion without sons and advised she would update them with any changes.   I introduced Palliative Medicine as specialized medical care for people living with serious illness. It focuses on providing relief from the symptoms and stress of a serious illness. The goal is to improve  quality of life for both the patient and the family.  We discussed a brief life review of the patient.  Patient states she has been married to her husband for more than 60 years.  Unfortunately over the past 2 years he is developed advanced dementia and was recently placed in a memory care facility for safety.  Patient has 3 sons 1 of whom she lives with.  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  She is a woman of [employer].  She enjoys spending time with her family and crocheting.  As far as functional and nutritional status patient reports her appetite is fair, some days are better than others.  She reports she has had a rough time with her health since December 2018 when she was hospitalized for a small bowel obstruction and later found that she had lung cancer.  Since then she has completed radiation therapy.  Patient states over the past 2 months she has noticed a decrease in her energy level and a increase in her shortness of breath.  She reports she often has to take frequent breaks when performing ADLs and uses a cane or walker for long distance ambulation.  She is on home oxygen at all  times.  She lives with her son and her granddaughter.   We discussed her current illness and what it means in the larger context of her on-going co-morbidities.  Natural disease trajectory and expectations at EOL were discussed.  Patient is aware of her current current illness and her ongoing comorbidities.  She states that she is aware that she is not a surgical candidate for appendicitis and she agrees as she knows that her respiratory status is not the biggest.  She is hopeful that it will be managed with antibiotics but is aware that there is a chance that antibiotics could potentially not work and that she could have other complications such as perforation as explained by general surgery which could potentially lead to death.  Patient states she would just continue to have to take one  day at a time and be hopeful.  I attempted to elicit values and goals of care important to the patient.    The difference between aggressive medical intervention and comfort care was considered in light of the patient's goals of care.  He would like to continue with all times of aggressive medical intervention that are available given her current situation.   Advanced directives, concepts specific to code status, artifical feeding and hydration, and rehospitalization were considered and discussed.  Patient reports she has advanced directives and her son, Herbie Baltimore is her HCPOA.  Patient confirms that she does not want any forms of life-sustaining measures such as CPR, defibrillation, or intubation.  However she is concerned that in an emergency situation her sons would go against her wishes.  She does not have a copy of her advanced directives on file and wishes to complete so that they can be placed in her medical file for future references.  Advised with the use chaplain support for completion of directives.  Hospice and Palliative Care services outpatient were explained and offered.  At this time patient would not like hospice services however she would like palliative care services outpatient to be a part of her care team at discharge.  Patient is aware that in the future if she has interest or feels hospice is more appropriate she can speak with her outpatient palliative care team and they can easily assist with transitioning her to hospice care.  She verbalized understanding.  Questions and concerns were addressed.  The family was encouraged to call with questions or concerns.  PMT will continue to support holistically.  PATIENT-patient is alert and oriented x3.  She is capable of making her own medical decisions.  She states if she is unable to make decisions her son Herbie Baltimore is her documented HCPOA.   SUMMARY OF RECOMMENDATIONS    DNR/DNI-as confirmed by patient  Continue to treat the treatable  while hospitalized.  Patient verbalized understanding and agreement that she is not a surgical candidate for her appendicitis and is hopeful that her antibiotics will manage her condition.  She is aware of the potential risk in regards to her condition.  Case management consult for outpatient palliative services at discharge.  Palliative team will continue to support patient, patient family, and medical team during hospitalization.  Code Status/Advance Care Planning:  DNR/DNI   Palliative Prophylaxis:   Aspiration, Bowel Regimen, Frequent Pain Assessment, Oral Care, Turn Reposition and out of bed.   Additional Recommendations (Limitations, Scope, Preferences): Full Scope Treatment - continue to treat the treatable without escalation of care.   Psycho-social/Spiritual:   Desire for further Chaplaincy support:NO   Prognosis:  Unable to determine-guarded to poor in the setting of appendicitis and deemed not a surgical candidate due to high risk of respiratory failure/complications, hypertension, MI, poor p.o. intake, deconditioned, malnutrition, lung cancer status post radiation therapy, and COPD with continuous home oxygen use of 8 L/[place].  Discharge Planning: Home with Palliative Services      Primary Diagnoses: Present on Admission: . Appendicitis . Paroxysmal atrial fibrillation (HCC) . Essential hypertension . Chronic hypoxemic respiratory failure ([place])   I have reviewed the medical record, interviewed the patient and family, and examined the patient. The following aspects are pertinent.  Past Medical History:  Diagnosis Date  . Anemia    as a child  . Arthritis    "hands" (12/24/2014)  . Complication of anesthesia    " My blood gas dropped during surgery so I was left on a ventilator and in ICU for 3 days."  . COPD (chronic obstructive pulmonary disease) (HCC)    FVC 72%, FEV1 29%, FEV1 ratio 32% (very severe (COPD)  . COPD (chronic obstructive pulmonary disease)  ([place])   . Degenerative disc disease   . Depression with anxiety   . Diverticulosis   . DVT (deep venous thrombosis) ([place])    "LLE; years ago after a surgery"  . Essential hypertension   . Fluttering sensation of heart    pt put on metoprolol as a result  . GERD (gastroesophageal reflux disease)    PMH  . H/O hiatal hernia   . Headache    "maybe weekly" (12/24/2014)  . Hyperlipidemia   . Hypertension   . Kidney stone   . Liver lesion   . MI (myocardial infarction) ([place]) 07/19/2017  . On home oxygen therapy    "3L; sleep w/it; use it when I rest" (12/24/2014)  . Peptic ulcer   . Pinched nerve    in back  . Pneumonia   . PONV (postoperative nausea and vomiting)   . Shortness of breath dyspnea    with exertion  . Skipped heart beats   . Small bowel obstruction ([place])    "several times; OR twice" (12/24/2014)  . Tubular adenoma of colon 09/2011  . Wears glasses    Social History   Socioeconomic History  . Marital status: Married    Spouse name: Not on file  . Number of children: 3  . Years of education: Not on file  . Highest education level: Not on file  Occupational History  . Occupation: retired.    Social Needs  . Financial resource strain: Not on file  . Food insecurity:    Worry: Not on file    Inability: Not on file  . Transportation needs:    Medical: Not on file    Non-medical: Not on file  Tobacco Use  . Smoking status: Former Smoker    Packs/day: 0.25    Years: 57.00    Pack years: 14.25    Types: Cigarettes    Last attempt to quit: 07/25/2015    Years since quitting: 2.5  . Smokeless tobacco: Never Used  . Tobacco comment: 5 cigarettes a day  Substance and Sexual Activity  . Alcohol use: No    Alcohol/week: 0.0 oz  . Drug use: No  . Sexual activity: Never  Lifestyle  . Physical activity:    Days per week: Not on file    Minutes per session: Not on file  . Stress: Not on file  Relationships  . Social connections:    Talks on phone:  Not on file     Gets together: Not on file    Attends religious service: Not on file    Active member of club or organization: Not on file    Attends meetings of clubs or organizations: Not on file    Relationship status: Not on file  Other Topics Concern  . Not on file  Social History Narrative   No regular exercise.    Family History  Problem Relation Age of Onset  . Hypertension Mother   . Ovarian cancer Mother   . Glaucoma Mother   . Glaucoma Sister   . Pancreatic cancer Maternal Uncle   . Colon cancer Neg Hx   . Colon polyps Neg Hx   . Diabetes Neg Hx   . Kidney disease Neg Hx   . Gallbladder disease Neg Hx   . Esophageal cancer Neg Hx    Scheduled Meds: . amLODipine  10 mg Oral Daily  . docusate sodium  100 mg Oral Daily  . dronabinol  5 mg Oral BID  . enoxaparin (LOVENOX) injection  30 mg Subcutaneous Q24H  . famotidine  20 mg Oral Daily  . fluticasone furoate-vilanterol  1 puff Inhalation Daily  . guaiFENesin  600 mg Oral BID  . lactose free nutrition  237 mL Oral TID WC  . losartan  100 mg Oral Daily  . metoprolol tartrate  50 mg Oral BID  . multivitamin with minerals  1 tablet Oral Daily  . pantoprazole  80 mg Oral Daily  . polyethylene glycol  17 g Oral Daily  . predniSONE  10 mg Oral Q breakfast  . umeclidinium bromide  1 puff Inhalation Daily   Continuous Infusions: . sodium chloride    . ceFEPime (MAXIPIME) IV Stopped (02/07/18 0546)   And  . metronidazole Stopped (02/07/18 1445)   PRN Meds:.sodium chloride, acetaminophen, albuterol, diphenhydrAMINE **OR** diphenhydrAMINE, HYDROcodone-acetaminophen, lidocaine, morphine injection, ondansetron **OR** ondansetron (ZOFRAN) IV, promethazine, traMADol Medications Prior to Admission:  Prior to Admission medications   Medication Sig Start Date End Date Taking? Authorizing Provider  acetaminophen (TYLENOL) 325 MG tablet Take 325-650 mg by mouth every 6 (six) hours as needed for headache.   Yes [provider]    albuterol (PROVENTIL) (2.5 MG/3ML) 0.083% nebulizer solution USE 1 VIAL (3 ML) VIA NEBULIZER EVERY 6 HOURS AS NEEDED FOR WHEEZING OR SHORTNESS OF BREATH 02/11/17  Yes [name], MD  AMBULATORY NON FORMULARY MEDICATION Medication Name: GI cocktail: 176m Lidocaine2%, 1063mMaalox, 5044monnatol. Ok to take twice daily. 08/14/17  Yes MetHali MarryD  BREO ELLIPTA 100-25 MCG/INH AEPB USE 1 INHALATION DAILY 10/03/17  Yes MetHali MarryD  clopidogrel (PLAVIX) 75 MG tablet TAKE 1 TABLET DAILY 01/22/18  Yes MetHali MarryD  docusate sodium (COLACE) 100 MG capsule Take 1 capsule (100 mg total) by mouth daily. 09/22/17 09/22/18 Yes PatLavina HammanD  dronabinol (MARINOL) 5 MG capsule Take 1 capsule (5 mg total) by mouth 2 (two) times daily. 11/20/17  Yes MetHali MarryD  feeding supplement (BOOST / RESOURCE BREEZE) LIQD Take 1 Container by mouth 2 (two) times daily between meals. Chocolate boost   Yes [provider]  guaiFENesin (MUCINEX) 600 MG 12 hr tablet Take 1 tablet (600 mg total) by mouth 2 (two) times daily. 04/03/16  Yes MadBarton DuboisD  HYDROcodone-acetaminophen ([facility]-325 MG tablet Take 1 tablet by mouth every 6 (six) hours as needed. Patient taking differently: Take 1 tablet by  mouth every 6 (six) hours as needed for moderate pain.  01/20/18  Yes [name], MD  INCRUSE ELLIPTA 62.5 MCG/INH AEPB USE 1 INHALATION DAILY 07/25/17  Yes [name], MD  lidocaine (XYLOCAINE) 2 % solution Use as directed 20 mLs in the mouth or throat 2 (two) times daily as needed. Patient taking differently: Use as directed 20 mLs in the mouth or throat 2 (two) times daily as needed for mouth pain.  04/17/17  Yes [name], MD  losartan (COZAAR) 100 MG tablet Take 1 tablet (100 mg total) by mouth daily. 11/12/17  Yes [name], MD  metoprolol tartrate (LOPRESSOR) 50 MG tablet Take 1 tablet (50 mg total) by mouth 2 (two)  times daily. 08/05/17  Yes [name], MD  Multiple Vitamin (MULTIVITAMIN WITH MINERALS) TABS tablet Take 1 tablet by mouth daily. Pt uses One-A-Day brand   Yes [provider]  omeprazole (PRILOSEC) 40 MG capsule Take 1 capsule (40 mg total) by mouth 2 (two) times daily. 10/30/17  Yes [name], MD  ondansetron (ZOFRAN-ODT) 8 MG disintegrating tablet PLACE 1 TABLET ON THE TONGUE EVERY 8 HOURS AS NEEDED FOR NAUSEA 08/09/17  Yes [name], MD  polyethylene glycol (MIRALAX / GLYCOLAX) packet Take 17 g by mouth daily. 07/28/17  Yes Rosita Fire, MD  predniSONE (DELTASONE) 10 MG tablet Take 1 tablet (10 mg total) by mouth daily with breakfast. 11/20/17  Yes [name], MD  promethazine (PHENERGAN) 25 MG tablet Take 1 tablet (25 mg total) by mouth every 8 (eight) hours as needed for nausea or vomiting. 09/02/17  Yes [name], MD  ranitidine (ZANTAC) 150 MG tablet Take 1 tablet (150 mg total) by mouth daily. 01/03/17  Yes [name], MD  traMADol (ULTRAM) 50 MG tablet Take 1 tablet (50 mg total) by mouth every 6 (six) hours as needed (pain). 11/12/17  Yes [name], MD  AMBULATORY NON FORMULARY MEDICATION Medication Name: Needs oxygen concentrator set to 6 liter per minutes as she has a long tubing on her machine.  Fax to [place] Patient taking differently: Medication Name: Needs oxygen concentrator set to 8 liter per minutes as she has a long tubing on her machine.  Fax to [name] 08/27/16   [name], MD  Nutritional Supplements (FEEDING SUPPLEMENT, BOOST BREEZE,) LIQD Take 1 Can by mouth 3 (three) times daily. Patient not taking: Reported on 02/06/2018 09/22/17   Lavina Hamman, MD  sucralfate (CARAFATE) 1 GM/10ML suspension Take 10 mLs (1 g total) by mouth 4 (four) times daily -  with meals and at bedtime. Patient not taking: Reported on 01/27/2018 12/11/17   [name], MD   Allergies  Allergen  Reactions  . Tyloxapol Itching  . Maxidex [Dexamethasone] Other (See Comments)    DEHYDRATION AND HEART RACING  . Advair Diskus [Fluticasone-Salmeterol] Other (See Comments)    No benefit with lungs (INEFFECTIVE)  . Remeron [Mirtazapine] Other (See Comments)    CAUSED NIGHTMARES  . Trazodone And Nefazodone Other (See Comments)    Heart pounding  . Tylox [Oxycodone-Acetaminophen] Itching   Review of Systems  Constitutional: Positive for activity change, appetite change and fatigue.  Respiratory: Positive for shortness of breath.   Gastrointestinal: Positive for abdominal pain.  Neurological: Positive for weakness.  All other systems reviewed and are negative.   Physical Exam  Constitutional: Vital signs are normal. She is cooperative. She has a sickly appearance.  Thin and frail in appearance   Cardiovascular: Normal rate, regular rhythm, normal heart sounds and normal pulses.  Pulmonary/Chest: Effort normal. She has decreased breath sounds.  8L/[place]  Neurological: She is alert.  Nursing note and vitals reviewed.   Vital Signs: BP (!) 147/59 (BP Location: Left Arm)   Pulse 75   Temp 98.5 F (36.9 C) (Oral)   Resp 18   Ht 5' 3.5" (1.613 m)   Wt 48 kg (105 lb 14.4 oz)   SpO2 100%   BMI 18.47 kg/m  Pain Scale: 0-10   Pain Score: 9    SpO2: SpO2: 100 % O2 Device:SpO2: 100 % O2 Flow Rate: .O2 Flow Rate (L/min): 9 L/min  IO: Intake/output summary:   Intake/Output Summary (Last 24 hours) at 02/07/2018 1636 Last data filed at 02/07/2018 1415 Gross per 24 hour  Intake 909 ml  Output 900 ml  Net 9 ml    LBM: Last BM Date: 02/06/18 Baseline Weight: Weight: 44.9 kg (99 lb) Most recent weight: Weight: 48 kg (105 lb 14.4 oz)     Palliative Assessment/Data: PPS 40 %   Time In: 1400 Time Out: 1515 Time Total: 75 min.   Greater than 50%  of this time was spent counseling and coordinating care related to the above assessment and plan.  Signed by:  Alda Lea, NP-BC Palliative Medicine Team  Phone: [phone] Fax: [phone] Pager: [phone] Amion: Bjorn Pippin     Please contact Palliative Medicine Team phone at [phone] for questions and concerns.  For individual provider: See Shea Evans

## 2018-02-07 NOTE — Progress Notes (Addendum)
[facility] Progress Note     Subjective: CC-  Patient states that she continues to have some RLQ discomfort, but it is slightly better than prior to admission. Nausea improved. Last BM was yesterday. Takes miralax daily at home.   Reports having increased abdominal pain for about 4 days prior to coming to the hospital. H/o SBO's and initially thought that this was the cause of her pain. She has required ex lap LOA x2 in 2014 and 2016, and her last SBO resolved with NG tube 07/2017.  Uses walker for ambulation. Lives at home with son, daughter in law, and granddaughter.   Objective: Vital signs in last 24 hours: Temp:  [98.3 F (36.8 C)-99.5 F (37.5 C)] 99.5 F (37.5 C) (07/19 0507) Pulse Rate:  [68-93] 79 (07/19 0507) Resp:  [11-25] 16 (07/19 0507) BP: (155-199)/(57-79) 160/62 (07/19 0507) SpO2:  [96 %-100 %] 100 % (07/19 0507) Weight:  [99 lb (44.9 kg)] 99 lb (44.9 kg) (07/18 1506) Last BM Date: 02/06/18  Intake/Output from previous day: 07/18 0701 - 07/19 0700 In: 678.2 [P.O.:60; I.V.:43.2; IV Piggyback:575] Out: 650 [Urine:650] Intake/Output this shift: No intake/output data recorded.  PE: Gen:  Alert, NAD, pleasant HEENT: EOM's intact, pupils equal and round Card:  RRR, 2+ DP pulses bilaterally Pulm:  Trace bilateral wheezing, effort normal on 8L O2 [place] Abd: well healed midline incision, soft, mild distension, +BS, TTP RLQ with guarding/no rebound tenderness Ext:  Calves soft and nontender Psych: A&Ox3  Skin: no rashes noted, warm and dry  Lab Results:  Recent Labs    02/06/18 1656 02/07/18 0449  WBC 10.9* 13.2*  HGB 12.1 10.7*  HCT 36.9 33.5*  PLT 325 356   BMET Recent Labs    02/06/18 1656 02/07/18 0449  NA 133* 139  K 4.3 3.9  CL 93* 99  CO2 29 31  GLUCOSE 101* 123*  BUN 8 9  CREATININE 0.73 0.76  CALCIUM 8.6* 8.6*   PT/INR No results for input(s): LABPROT, INR in the last 72 hours. CMP     Component Value Date/Time   NA 139  02/07/2018 0449   K 3.9 02/07/2018 0449   CL 99 02/07/2018 0449   CO2 31 02/07/2018 0449   GLUCOSE 123 (H) 02/07/2018 0449   BUN 9 02/07/2018 0449   CREATININE 0.76 02/07/2018 0449   CREATININE 0.80 01/13/2018 1335   CREATININE 0.72 08/05/2017 1440   CALCIUM 8.6 (L) 02/07/2018 0449   PROT 6.8 02/06/2018 1656   ALBUMIN 3.6 02/06/2018 1656   AST 21 02/06/2018 1656   AST 21 01/13/2018 1335   ALT 12 02/06/2018 1656   ALT 10 01/13/2018 1335   ALKPHOS 72 02/06/2018 1656   BILITOT 0.2 (L) 02/06/2018 1656   BILITOT 0.2 01/13/2018 1335   GFRNONAA >60 02/07/2018 0449   GFRNONAA >60 01/13/2018 1335   GFRNONAA 81 08/05/2017 1440   GFRAA >60 02/07/2018 0449   GFRAA >60 01/13/2018 1335   GFRAA 94 08/05/2017 1440   Lipase     Component Value Date/Time   LIPASE 33 02/06/2018 1656       Studies/Results: Ct Abdomen Pelvis W Contrast  Result Date: 02/06/2018 CLINICAL DATA:  Generalized abdominal pain for the past 3 days. EXAM: CT ABDOMEN AND PELVIS WITH CONTRAST TECHNIQUE: Multidetector CT imaging of the abdomen and pelvis was performed using the standard protocol following bolus administration of intravenous contrast. CONTRAST:  121mL ISOVUE-300 IOPAMIDOL (ISOVUE-300) INJECTION 61% COMPARISON:  PET-CT dated January 08, 2018. CT abdomen  pelvis dated July 18, 2017. FINDINGS: Lower chest: No acute abnormality. Hepatobiliary: Small hypodense lesions in the left and right hepatic lobes measuring up to 1.1 cm are unchanged, likely small cysts. There is a 1.3 cm discontinuous arterially enhancing lesion in the inferior right hepatic lobe which demonstrates enhancement on delayed images, consistent with a hemangioma. Status post cholecystectomy. Unchanged mild central intrahepatic biliary dilatation. Pancreas: Unremarkable. Prominence of the main pancreatic duct is similar to prior studies. No surrounding inflammatory changes. Spleen: Normal in size without focal abnormality. Adrenals/Urinary Tract:  Adrenal glands are unremarkable. Kidneys are normal, without renal calculi, focal lesion, or hydronephrosis. Bladder is moderately distended. Stomach/Bowel: Appendix is fluid-filled and dilated, containing several appendicoliths, with mild periappendiceal inflammatory changes. Appendix: Location: Medial to the cecum coursing posteriorly. Diameter: 11 mm. Appendicolith: One at the tip and one at the base. Mucosal hyper-enhancement: Present. Extraluminal gas: None. Periappendiceal collection: None. Stomach is within normal limits. No evidence of bowel wall thickening, distention, or inflammatory changes. Left-sided colonic diverticulosis. Vascular/Lymphatic: Aortic atherosclerosis. No enlarged abdominal or pelvic lymph nodes. Reproductive: Status post hysterectomy. No adnexal masses. Other: Small bilateral fat containing inguinal hernias. No free fluid or pneumoperitoneum. Musculoskeletal: No acute or significant osseous findings. Unchanged bilateral L5 pars defects. IMPRESSION: 1. Acute appendicitis.  No perforation or abscess. 2.  Aortic atherosclerosis (ICD10-I70.0). Electronically Signed   By: Titus Dubin M.D.   On: 02/06/2018 18:25   Dg Chest Port 1 View  Result Date: 02/06/2018 CLINICAL DATA:  Preop for appendicitis.  Dyspnea. EXAM: PORTABLE CHEST 1 VIEW COMPARISON:  07/24/2017 CXR, PET-CT 01/08/2018 FINDINGS: Ill-defined left apical opacity is noted that may represent stereotactic body radiation therapy change for known metabolically active left apical pulmonary nodule as seen on prior PET-CT. Emphysematous hyperinflation of the lungs without acute pulmonary consolidation or CHF. No effusion is identified though the right lateral costophrenic angle is minimally excluded. Heart size is within normal limits. Moderate aortic atherosclerosis is seen. IMPRESSION: Emphysematous hyperinflation of the lungs with post treatment change felt to account for the ill-defined opacity at the left lung apex post SBRT.  No acute pulmonary consolidation suspicious for pneumonia. Electronically Signed   By: Ashley Royalty M.D.   On: 02/06/2018 23:12    Anti-infectives: Anti-infectives (From admission, onward)   Start     Dose/Rate Route Frequency Ordered Stop   02/07/18 0600  ceFEPIme (MAXIPIME) 2 g in sodium chloride 0.9 % 100 mL IVPB  Status:  Discontinued     2 g 200 mL/hr over 30 Minutes Intravenous Every 12 hours 02/07/18 0106 02/07/18 0122   02/07/18 0600  ceFEPIme (MAXIPIME) 1 g in sodium chloride 0.9 % 100 mL IVPB     1 g 200 mL/hr over 30 Minutes Intravenous Every 12 hours 02/07/18 0122     02/07/18 0400  metroNIDAZOLE (FLAGYL) IVPB 500 mg  Status:  Discontinued     500 mg 100 mL/hr over 60 Minutes Intravenous Every 8 hours 02/07/18 0106 02/07/18 0122   02/07/18 0400  metroNIDAZOLE (FLAGYL) IVPB 500 mg     500 mg 100 mL/hr over 60 Minutes Intravenous Every 8 hours 02/07/18 0122     02/07/18 0030  piperacillin-tazobactam (ZOSYN) IVPB 3.375 g     3.375 g 100 mL/hr over 30 Minutes Intravenous  Once 02/07/18 0017 02/07/18 0120   02/06/18 2245  piperacillin-tazobactam (ZOSYN) IVPB 3.375 g     3.375 g 100 mL/hr over 30 Minutes Intravenous  Once 02/06/18 2240 02/06/18 2332   02/06/18 1845  cefTRIAXone (ROCEPHIN) 1 g in sodium chloride 0.9 % 100 mL IVPB     1 g 200 mL/hr over 30 Minutes Intravenous  Once 02/06/18 1837 02/06/18 1924   02/06/18 1845  metroNIDAZOLE (FLAGYL) IVPB 500 mg     500 mg 100 mL/hr over 60 Minutes Intravenous  Once 02/06/18 1837 02/06/18 2032       Assessment/Plan Severe COPD - on 8L O2 at home, 10mg  prednisone daily, continue home meds H/o hypermetabolic 8.0KL pulm nodule s/p SBRT x6 fractions CAD h/o STEMI 07/2017 treated with medical management, on Plavix (last dose 7/18) HTN - home meds GERD - protonix H/o PAF - not on anticoagulation, currently in NSR Prior h/o DVT  Code status DNR/DNI  Acute appendicitis without abscess or perforation - 4 days of abdominal pain and  nausea prior to admission - CT scan 7/18 showed acute appendicitis with dilated appendix, several appendicoliths and mild periappendiceal inflammatory changes  ID - maxipime 7/19>>, flagyl 7/19>>, zosyn x1 7/19, rocephin x1 7/18 FEN - IVF, Boost, sips of clears, miralax/colace VTE - SCDs, lovenox Foley - none  Plan - Continue medical management of appendicitis with maxipime/flagyl. Ok for sips of clears as long as patient is not nauseated. Discussed importance of ambulating/OOB.  Will discuss holding vs restarting plavix with MD. Transfer to tele floor for cardiac monitoring. Appreciate CCM/hospitalist assistance. Palliative consult pending.   LOS: 1 day    Wellington Hampshire , [facility] 02/07/2018, 9:11 AM Pager: [phone] Consults: [phone] Mon 7:00 am -11:30 AM Tues-Fri 7:00 am-4:30 pm Sat-Sun 7:00 am-11:30 am

## 2018-02-07 NOTE — Progress Notes (Signed)
Nurses to notify chaplain of request, they handle advance directives. Will likely be addressed as well with Palliative Care consult. [phone]

## 2018-02-07 NOTE — Consult Note (Signed)
Reason for Consult: Management of medical issues. Referring Physician: Dr. Leighton Ruff.  Tamara Adams is an 77 y.o. female.  HPI: History of severe COPD on 8 L oxygen, hypertension, CAD resents to the ER because of worsening abdominal pain.  Patient states he has chronic abdominal pain over the last 4 days has been worsening pain is mostly in the right lower quadrant with some nausea denies any vomiting or diarrhea.  Denies any fever or chills.  In the ER patient had CT abdomen which shows features consistent with acute appendicitis.  Pulmonary critical care was consulted and patient was found to be a very high risk for surgery and plan is to manage patient conservatively with antibiotics.  Medical consult called for management of medical issues.  Past Medical History:  Diagnosis Date  . Anemia    as a child  . Arthritis    "hands" (12/24/2014)  . Complication of anesthesia    " My blood gas dropped during surgery so I was left on a ventilator and in ICU for 3 days."  . COPD (chronic obstructive pulmonary disease) (HCC)    FVC 72%, FEV1 29%, FEV1 ratio 32% (very severe (COPD)  . COPD (chronic obstructive pulmonary disease) ([place])   . Degenerative disc disease   . Depression with anxiety   . Diverticulosis   . DVT (deep venous thrombosis) ([place])    "LLE; years ago after a surgery"  . Essential hypertension   . Fluttering sensation of heart    pt put on metoprolol as a result  . GERD (gastroesophageal reflux disease)    PMH  . H/O hiatal hernia   . Headache    "maybe weekly" (12/24/2014)  . Hyperlipidemia   . Hypertension   . Kidney stone   . Liver lesion   . MI (myocardial infarction) ([place]) 07/19/2017  . On home oxygen therapy    "3L; sleep w/it; use it when I rest" (12/24/2014)  . Peptic ulcer   . Pinched nerve    in back  . Pneumonia   . PONV (postoperative nausea and vomiting)   . Shortness of breath dyspnea    with exertion  . Skipped heart beats   . Small bowel obstruction  ([place])    "several times; OR twice" (12/24/2014)  . Tubular adenoma of colon 09/2011  . Wears glasses     Past Surgical History:  Procedure Laterality Date  . ABDOMINAL ADHESION SURGERY  12/24/2014  . ABDOMINAL HYSTERECTOMY  1987   and one ovary  . CATARACT EXTRACTION W/ INTRAOCULAR LENS  IMPLANT, BILATERAL Right 06/22/2012   Dr. Lester Kinsman.   Marland Kitchen CATARACT EXTRACTION W/PHACO Left 06/12/2012   Dr. Boykin Reaper  . CHOLECYSTECTOMY N/A 08/18/2013   Procedure: LAPAROSCOPIC CHOLECYSTECTOMY;  Surgeon: Harl Bowie, MD;  Location: [place];  Service: General;  Laterality: N/A;  . DILATION AND CURETTAGE OF UTERUS    . EXPLORATORY LAPAROTOMY  12/24/2014  . HERNIA REPAIR    . INCISIONAL HERNIA REPAIR  12/24/2014  . INCISIONAL HERNIA REPAIR N/A 12/24/2014   Procedure: HERNIA REPAIR INCISIONAL;  Surgeon: Coralie Keens, MD;  Location: [place];  Service: General;  Laterality: N/A;  . LAPAROTOMY N/A 12/24/2014   Procedure: EXPLORATORY LAPAROTOMY;  Surgeon: Coralie Keens, MD;  Location: [place];  Service: General;  Laterality: N/A;  . LYSIS OF ADHESION N/A 12/24/2014   Procedure: LYSIS OF ADHESION;  Surgeon: Coralie Keens, MD;  Location: [place];  Service: General;  Laterality: N/A;  . MULTIPLE TOOTH EXTRACTIONS    .  OOPHORECTOMY  1992  . SMALL INTESTINE SURGERY     for a blockage, no bowel was removed, just kinked from scar tissue  . TUBAL LIGATION    . URETHRAL DILATION      Family History  Problem Relation Age of Onset  . Hypertension Mother   . Ovarian cancer Mother   . Glaucoma Mother   . Glaucoma Sister   . Pancreatic cancer Maternal Uncle   . Colon cancer Neg Hx   . Colon polyps Neg Hx   . Diabetes Neg Hx   . Kidney disease Neg Hx   . Gallbladder disease Neg Hx   . Esophageal cancer Neg Hx     Social History:  reports that she quit smoking about 2 years ago. Her smoking use included cigarettes. She has a 14.25 pack-year smoking history. She has never used smokeless tobacco. She reports that  she does not drink alcohol or use drugs.  Allergies:  Allergies  Allergen Reactions  . Tyloxapol Itching  . Maxidex [Dexamethasone] Other (See Comments)    DEHYDRATION AND HEART RACING  . Advair Diskus [Fluticasone-Salmeterol] Other (See Comments)    No benefit with lungs (INEFFECTIVE)  . Remeron [Mirtazapine] Other (See Comments)    CAUSED NIGHTMARES  . Trazodone And Nefazodone Other (See Comments)    Heart pounding  . Tylox [Oxycodone-Acetaminophen] Itching    Medications: I have reviewed the patient's current medications.  Results for orders placed or performed during the hospital encounter of 02/06/18 (from the past 48 hour(s))  Comprehensive metabolic panel     Status: Abnormal   Collection Time: 02/06/18  4:56 PM  Result Value Ref Range   Sodium 133 (L) 135 - 145 mmol/L   Potassium 4.3 3.5 - 5.1 mmol/L   Chloride 93 (L) 98 - 111 mmol/L    Comment: Please note change in reference range.   CO2 29 22 - 32 mmol/L   Glucose, Bld 101 (H) 70 - 99 mg/dL    Comment: Please note change in reference range.   BUN 8 8 - 23 mg/dL    Comment: Please note change in reference range.   Creatinine, Ser 0.73 0.44 - 1.00 mg/dL   Calcium 8.6 (L) 8.9 - 10.3 mg/dL   Total Protein 6.8 6.5 - 8.1 g/dL   Albumin 3.6 3.5 - 5.0 g/dL   AST 21 15 - 41 U/L   ALT 12 0 - 44 U/L    Comment: Please note change in reference range.   Alkaline Phosphatase 72 38 - 126 U/L   Total Bilirubin 0.2 (L) 0.3 - 1.2 mg/dL   GFR calc non Af Amer >60 >60 mL/min   GFR calc Af Amer >60 >60 mL/min    Comment: (NOTE) The eGFR has been calculated using the CKD EPI equation. This calculation has not been validated in all clinical situations. eGFR's persistently <60 mL/min signify possible Chronic Kidney Disease.    Anion gap 11 5 - 15    Comment: Performed at [facility], [address] [place], [place] [address]  Lipase, blood     Status: None   Collection Time: 02/06/18  4:56 PM  Result Value Ref  Range   Lipase 33 11 - 51 U/L    Comment: Performed at [facility], [address] [place], [place] [address]  CBC with Differential     Status: Abnormal   Collection Time: 02/06/18  4:56 PM  Result Value Ref Range  WBC 10.9 (H) 4.0 - 10.5 K/uL   RBC 3.93 3.87 - 5.11 MIL/uL   Hemoglobin 12.1 12.0 - 15.0 g/dL   HCT 36.9 36.0 - 46.0 %   MCV 93.9 78.0 - 100.0 fL   MCH 30.8 26.0 - 34.0 pg   MCHC 32.8 30.0 - 36.0 g/dL   RDW 12.3 11.5 - 15.5 %   Platelets 325 150 - 400 K/uL   Neutrophils Relative % 63 %   Neutro Abs 6.9 1.7 - 7.7 K/uL   Lymphocytes Relative 15 %   Lymphs Abs 1.6 0.7 - 4.0 K/uL   Monocytes Relative 10 %   Monocytes Absolute 1.1 (H) 0.1 - 1.0 K/uL   Eosinophils Relative 11 %   Eosinophils Absolute 1.2 (H) 0.0 - 0.7 K/uL   Basophils Relative 1 %   Basophils Absolute 0.1 0.0 - 0.1 K/uL    Comment: Performed at [facility], [address] [place], [place] [address]  Basic metabolic panel     Status: Abnormal   Collection Time: 02/07/18  4:49 AM  Result Value Ref Range   Sodium 139 135 - 145 mmol/L   Potassium 3.9 3.5 - 5.1 mmol/L   Chloride 99 98 - 111 mmol/L    Comment: Please note change in reference range.   CO2 31 22 - 32 mmol/L   Glucose, Bld 123 (H) 70 - 99 mg/dL    Comment: Please note change in reference range.   BUN 9 8 - 23 mg/dL    Comment: Please note change in reference range.   Creatinine, Ser 0.76 0.44 - 1.00 mg/dL   Calcium 8.6 (L) 8.9 - 10.3 mg/dL   GFR calc non Af Amer >60 >60 mL/min   GFR calc Af Amer >60 >60 mL/min    Comment: (NOTE) The eGFR has been calculated using the CKD EPI equation. This calculation has not been validated in all clinical situations. eGFR's persistently <60 mL/min signify possible Chronic Kidney Disease.    Anion gap 9 5 - 15    Comment: Performed at [facility], [place] [address], [place], [place] [address]  CBC     Status: Abnormal   Collection Time: 02/07/18  4:49 AM   Result Value Ref Range   WBC 13.2 (H) 4.0 - 10.5 K/uL   RBC 3.50 (L) 3.87 - 5.11 MIL/uL   Hemoglobin 10.7 (L) 12.0 - 15.0 g/dL   HCT 33.5 (L) 36.0 - 46.0 %   MCV 95.7 78.0 - 100.0 fL   MCH 30.6 26.0 - 34.0 pg   MCHC 31.9 30.0 - 36.0 g/dL   RDW 13.2 11.5 - 15.5 %   Platelets 356 150 - 400 K/uL    Comment: Performed at [facility], [place] [address] [place],  [address]    Ct Abdomen Pelvis W Contrast  Result Date: 02/06/2018 CLINICAL DATA:  Generalized abdominal pain for the past 3 days. EXAM: CT ABDOMEN AND PELVIS WITH CONTRAST TECHNIQUE: Multidetector CT imaging of the abdomen and pelvis was performed using the standard protocol following bolus administration of intravenous contrast. CONTRAST:  157m ISOVUE-300 IOPAMIDOL (ISOVUE-300) INJECTION 61% COMPARISON:  PET-CT dated January 08, 2018. CT abdomen pelvis dated July 18, 2017. FINDINGS: Lower chest: No acute abnormality. Hepatobiliary: Small hypodense lesions in the left and right hepatic lobes measuring up to 1.1 cm are unchanged, likely small cysts. There is a 1.3 cm discontinuous arterially enhancing lesion in the inferior right hepatic lobe which demonstrates enhancement  on delayed images, consistent with a hemangioma. Status post cholecystectomy. Unchanged mild central intrahepatic biliary dilatation. Pancreas: Unremarkable. Prominence of the main pancreatic duct is similar to prior studies. No surrounding inflammatory changes. Spleen: Normal in size without focal abnormality. Adrenals/Urinary Tract: Adrenal glands are unremarkable. Kidneys are normal, without renal calculi, focal lesion, or hydronephrosis. Bladder is moderately distended. Stomach/Bowel: Appendix is fluid-filled and dilated, containing several appendicoliths, with mild periappendiceal inflammatory changes. Appendix: Location: Medial to the cecum coursing posteriorly. Diameter: 11 mm. Appendicolith: One at the tip and one at the base. Mucosal  hyper-enhancement: Present. Extraluminal gas: None. Periappendiceal collection: None. Stomach is within normal limits. No evidence of bowel wall thickening, distention, or inflammatory changes. Left-sided colonic diverticulosis. Vascular/Lymphatic: Aortic atherosclerosis. No enlarged abdominal or pelvic lymph nodes. Reproductive: Status post hysterectomy. No adnexal masses. Other: Small bilateral fat containing inguinal hernias. No free fluid or pneumoperitoneum. Musculoskeletal: No acute or significant osseous findings. Unchanged bilateral L5 pars defects. IMPRESSION: 1. Acute appendicitis.  No perforation or abscess. 2.  Aortic atherosclerosis (ICD10-I70.0). Electronically Signed   By: Titus Dubin M.D.   On: 02/06/2018 18:25   Dg Chest Port 1 View  Result Date: 02/06/2018 CLINICAL DATA:  Preop for appendicitis.  Dyspnea. EXAM: PORTABLE CHEST 1 VIEW COMPARISON:  07/24/2017 CXR, PET-CT 01/08/2018 FINDINGS: Ill-defined left apical opacity is noted that may represent stereotactic body radiation therapy change for known metabolically active left apical pulmonary nodule as seen on prior PET-CT. Emphysematous hyperinflation of the lungs without acute pulmonary consolidation or CHF. No effusion is identified though the right lateral costophrenic angle is minimally excluded. Heart size is within normal limits. Moderate aortic atherosclerosis is seen. IMPRESSION: Emphysematous hyperinflation of the lungs with post treatment change felt to account for the ill-defined opacity at the left lung apex post SBRT. No acute pulmonary consolidation suspicious for pneumonia. Electronically Signed   By: Ashley Royalty M.D.   On: 02/06/2018 23:12    Review of Systems  Constitutional: Negative.   HENT: Negative.   Eyes: Negative.   Respiratory: Negative.   Cardiovascular: Negative.   Gastrointestinal: Positive for abdominal pain and nausea.  Genitourinary: Negative.   Musculoskeletal: Negative.   Skin: Negative.    Neurological: Negative.   Endo/Heme/Allergies: Negative.   Psychiatric/Behavioral: Negative.    Blood pressure (!) 160/62, pulse 79, temperature 99.5 F (37.5 C), temperature source Oral, resp. rate 16, height 5' 3.5" (1.613 m), weight 44.9 kg (99 lb), SpO2 100 %. Physical Exam  Constitutional: She is oriented to person, place, and time. She appears well-developed and well-nourished. No distress.  HENT:  Head: Normocephalic and atraumatic.  Eyes: Pupils are equal, round, and reactive to light. Conjunctivae are normal. Right eye exhibits no discharge. Left eye exhibits no discharge.  Neck: Normal range of motion. Neck supple.  Cardiovascular: Normal rate and regular rhythm.  Respiratory: Effort normal and breath sounds normal. No respiratory distress. She has no wheezes. She has no rales.  GI: Soft. Bowel sounds are normal. There is tenderness.  Musculoskeletal: She exhibits no edema or tenderness.  Neurological: She is alert and oriented to person, place, and time. No cranial nerve deficit.  Skin: Skin is warm and dry. She is not diaphoretic.  Psychiatric: She has a normal mood and affect. Her behavior is normal.    Assessment/Plan: #1.  Acute appendicitis -patient started on empiric antibiotics for acute appendicitis and was felt to be high risk for surgery and at this time conservative management plan. #2.  Severe COPD with chronic  respiratory failure on 8 L oxygen.  Not in distress.  Continue inhalers prednisone.  Appreciate pulmonary critical care consult. #3.  Hypertension on Cozaar and metoprolol. #4.  History of CAD on Plavix metoprolol.  Thank you for involving [place] in patient's care will follow along with you.  Rise Patience 02/07/2018, 6:30 AM

## 2018-02-07 NOTE — ED Notes (Signed)
Attempted to call report. Receiving nurse will call back.

## 2018-02-07 NOTE — Progress Notes (Signed)
PROGRESS NOTE                                                                                                                                                                                                             Patient Demographics:    Tamara Adams, is a 77 y.o. female, DOB - May 08, 1941, MIW:[id number]  Admit date - 02/06/2018   Admitting Physician Md Edison Pace, MD  Outpatient Primary MD for the patient is [name], MD  LOS - 1   Chief Complaint  Patient presents with  . Abdominal Pain  . Shortness of Breath       Brief Narrative    This is a no charge note for patient who [facility] hospitalist were consulted earlier today 77 y.o. female.  HPI: History of severe COPD on 8 L oxygen, hypertension, CAD resents to the ER because of worsening abdominal pain.  Patient states he has chronic abdominal pain over the last 4 days has been worsening pain is mostly in the right lower quadrant with some nausea denies any vomiting or diarrhea.  Denies any fever or chills.  In the ER patient had CT abdomen which shows features consistent with acute appendicitis.  Pulmonary critical care was consulted and patient was found to be a very high risk for surgery and plan is to manage patient conservatively with antibiotics.  Medical consult called for management of medical issues.     Subjective:    Tamara Adams today has, No headache, No chest pain, reports abdominal pain  Assessment  & Plan :    Principal Problem:   Appendicitis Active Problems:   End stage COPD ([place])   Chronic hypoxemic respiratory failure ([facility])   Essential hypertension   Paroxysmal atrial fibrillation ([place])   #1.  Acute appendicitis -patient started on empiric antibiotics for acute appendicitis and was felt to be high risk for surgery and at this time conservative management plan. #2.  Severe COPD with chronic respiratory failure on 8 L oxygen.  Not in distress.  Continue inhalers  prednisone.  Appreciate pulmonary critical care consult. #3.  Hypertension on Cozaar and metoprolol. #4.  History of CAD on Plavix metoprolol.     Code Status : NR, confirmed by the patient  Family Communication  : None at bedside  Procedures  : none  DVT Prophylaxis  :  [place] lovenox  Lab Results  Component Value Date   PLT 356 02/07/2018    Antibiotics  :    Anti-infectives (From admission, onward)   Start     Dose/Rate Route Frequency Ordered Stop   02/07/18 0600  ceFEPIme (MAXIPIME) 2 g in sodium chloride 0.9 % 100 mL IVPB  Status:  Discontinued     2 g 200 mL/hr over 30 Minutes Intravenous Every 12 hours 02/07/18 0106 02/07/18 0122   02/07/18 0600  ceFEPIme (MAXIPIME) 1 g in sodium chloride 0.9 % 100 mL IVPB     1 g 200 mL/hr over 30 Minutes Intravenous Every 12 hours 02/07/18 0122     02/07/18 0400  metroNIDAZOLE (FLAGYL) IVPB 500 mg  Status:  Discontinued     500 mg 100 mL/hr over 60 Minutes Intravenous Every 8 hours 02/07/18 0106 02/07/18 0122   02/07/18 0400  metroNIDAZOLE (FLAGYL) IVPB 500 mg     500 mg 100 mL/hr over 60 Minutes Intravenous Every 8 hours 02/07/18 0122     02/07/18 0030  piperacillin-tazobactam (ZOSYN) IVPB 3.375 g     3.375 g 100 mL/hr over 30 Minutes Intravenous  Once 02/07/18 0017 02/07/18 0120   02/06/18 2245  piperacillin-tazobactam (ZOSYN) IVPB 3.375 g     3.375 g 100 mL/hr over 30 Minutes Intravenous  Once 02/06/18 2240 02/06/18 2332   02/06/18 1845  cefTRIAXone (ROCEPHIN) 1 g in sodium chloride 0.9 % 100 mL IVPB     1 g 200 mL/hr over 30 Minutes Intravenous  Once 02/06/18 1837 02/06/18 1924   02/06/18 1845  metroNIDAZOLE (FLAGYL) IVPB 500 mg     500 mg 100 mL/hr over 60 Minutes Intravenous  Once 02/06/18 1837 02/06/18 2032        Objective:   Vitals:   02/07/18 0929 02/07/18 0958 02/07/18 1053 02/07/18 1342  BP: (!) 145/56 (!) 169/60  (!) 120/52  Pulse: 72 76  64  Resp: 16   16  Temp: 99.7 F (37.6 C)   99.5 F (37.5 C)    TempSrc: Oral   Oral  SpO2: 100%   100%  Weight:   48 kg (105 lb 14.4 oz)   Height:        Wt Readings from Last 3 Encounters:  02/07/18 48 kg (105 lb 14.4 oz)  01/27/18 45 kg (99 lb 3.2 oz)  01/13/18 43.5 kg (96 lb)     Intake/Output Summary (Last 24 hours) at 02/07/2018 1556 Last data filed at 02/07/2018 1415 Gross per 24 hour  Intake 909 ml  Output 900 ml  Net 9 ml     Physical Exam  Awake Alert, Oriented X 3, No new F.N deficits, Normal affect Symmetrical Chest wall movement, Good air movement bilaterally, CTAB RRR,No Gallops,Rubs or new Murmurs, No Parasternal Heave +ve B.Sounds, patient with significant right lower quadrant tenderness with palpation, with some guarding No Cyanosis, Clubbing or edema, No new Rash or bruise      Data Review:    CBC Recent Labs  Lab 02/06/18 1656 02/07/18 0449  WBC 10.9* 13.2*  HGB 12.1 10.7*  HCT 36.9 33.5*  PLT 325 356  MCV 93.9 95.7  MCH 30.8 30.6  MCHC 32.8 31.9  RDW 12.3 13.2  LYMPHSABS 1.6  --   MONOABS 1.1*  --   EOSABS 1.2*  --   BASOSABS 0.1  --     Chemistries  Recent Labs  Lab 02/06/18 1656 02/07/18 0449  NA 133* 139  K 4.3  3.9  CL 93* 99  CO2 29 31  GLUCOSE 101* 123*  BUN 8 9  CREATININE 0.73 0.76  CALCIUM 8.6* 8.6*  AST 21  --   ALT 12  --   ALKPHOS 72  --   BILITOT 0.2*  --    ------------------------------------------------------------------------------------------------------------------ No results for input(s): CHOL, HDL, LDLCALC, TRIG, CHOLHDL, LDLDIRECT in the last 72 hours.  Lab Results  Component Value Date   HGBA1C 5.7 10/08/2016   ------------------------------------------------------------------------------------------------------------------ No results for input(s): TSH, T4TOTAL, T3FREE, THYROIDAB in the last 72 hours.  Invalid input(s): FREET3 ------------------------------------------------------------------------------------------------------------------ No results for  input(s): VITAMINB12, FOLATE, FERRITIN, TIBC, IRON, RETICCTPCT in the last 72 hours.  Coagulation profile No results for input(s): INR, PROTIME in the last 168 hours.  No results for input(s): DDIMER in the last 72 hours.  Cardiac Enzymes No results for input(s): CKMB, TROPONINI, MYOGLOBIN in the last 168 hours.  Invalid input(s): CK ------------------------------------------------------------------------------------------------------------------    Component Value Date/Time   BNP 54.3 03/30/2016 1400    Inpatient Medications  Scheduled Meds: . amLODipine  10 mg Oral Daily  . docusate sodium  100 mg Oral Daily  . dronabinol  5 mg Oral BID  . enoxaparin (LOVENOX) injection  30 mg Subcutaneous Q24H  . famotidine  20 mg Oral Daily  . fluticasone furoate-vilanterol  1 puff Inhalation Daily  . guaiFENesin  600 mg Oral BID  . lactose free nutrition  237 mL Oral TID WC  . losartan  100 mg Oral Daily  . metoprolol tartrate  50 mg Oral BID  . multivitamin with minerals  1 tablet Oral Daily  . pantoprazole  80 mg Oral Daily  . polyethylene glycol  17 g Oral Daily  . predniSONE  10 mg Oral Q breakfast  . umeclidinium bromide  1 puff Inhalation Daily   Continuous Infusions: . sodium chloride 250 mL (02/07/18 0126)  . ceFEPime (MAXIPIME) IV Stopped (02/07/18 0546)   And  . metronidazole Stopped (02/07/18 1445)   PRN Meds:.sodium chloride, acetaminophen, albuterol, diphenhydrAMINE **OR** diphenhydrAMINE, HYDROcodone-acetaminophen, lidocaine, morphine injection, ondansetron **OR** ondansetron (ZOFRAN) IV, promethazine, traMADol  Micro Results No results found for this or any previous visit (from the past 240 hour(s)).  Radiology Reports Ct Abdomen Pelvis W Contrast  Result Date: 02/06/2018 CLINICAL DATA:  Generalized abdominal pain for the past 3 days. EXAM: CT ABDOMEN AND PELVIS WITH CONTRAST TECHNIQUE: Multidetector CT imaging of the abdomen and pelvis was performed using the  standard protocol following bolus administration of intravenous contrast. CONTRAST:  197mL ISOVUE-300 IOPAMIDOL (ISOVUE-300) INJECTION 61% COMPARISON:  PET-CT dated January 08, 2018. CT abdomen pelvis dated July 18, 2017. FINDINGS: Lower chest: No acute abnormality. Hepatobiliary: Small hypodense lesions in the left and right hepatic lobes measuring up to 1.1 cm are unchanged, likely small cysts. There is a 1.3 cm discontinuous arterially enhancing lesion in the inferior right hepatic lobe which demonstrates enhancement on delayed images, consistent with a hemangioma. Status post cholecystectomy. Unchanged mild central intrahepatic biliary dilatation. Pancreas: Unremarkable. Prominence of the main pancreatic duct is similar to prior studies. No surrounding inflammatory changes. Spleen: Normal in size without focal abnormality. Adrenals/Urinary Tract: Adrenal glands are unremarkable. Kidneys are normal, without renal calculi, focal lesion, or hydronephrosis. Bladder is moderately distended. Stomach/Bowel: Appendix is fluid-filled and dilated, containing several appendicoliths, with mild periappendiceal inflammatory changes. Appendix: Location: Medial to the cecum coursing posteriorly. Diameter: 11 mm. Appendicolith: One at the tip and one at the base. Mucosal hyper-enhancement: Present. Extraluminal gas: None. Periappendiceal  collection: None. Stomach is within normal limits. No evidence of bowel wall thickening, distention, or inflammatory changes. Left-sided colonic diverticulosis. Vascular/Lymphatic: Aortic atherosclerosis. No enlarged abdominal or pelvic lymph nodes. Reproductive: Status post hysterectomy. No adnexal masses. Other: Small bilateral fat containing inguinal hernias. No free fluid or pneumoperitoneum. Musculoskeletal: No acute or significant osseous findings. Unchanged bilateral L5 pars defects. IMPRESSION: 1. Acute appendicitis.  No perforation or abscess. 2.  Aortic atherosclerosis (ICD10-I70.0).  Electronically Signed   By: Titus Dubin M.D.   On: 02/06/2018 18:25   Dg Chest Port 1 View  Result Date: 02/06/2018 CLINICAL DATA:  Preop for appendicitis.  Dyspnea. EXAM: PORTABLE CHEST 1 VIEW COMPARISON:  07/24/2017 CXR, PET-CT 01/08/2018 FINDINGS: Ill-defined left apical opacity is noted that may represent stereotactic body radiation therapy change for known metabolically active left apical pulmonary nodule as seen on prior PET-CT. Emphysematous hyperinflation of the lungs without acute pulmonary consolidation or CHF. No effusion is identified though the right lateral costophrenic angle is minimally excluded. Heart size is within normal limits. Moderate aortic atherosclerosis is seen. IMPRESSION: Emphysematous hyperinflation of the lungs with post treatment change felt to account for the ill-defined opacity at the left lung apex post SBRT. No acute pulmonary consolidation suspicious for pneumonia. Electronically Signed   By: Ashley Royalty M.D.   On: 02/06/2018 23:12    Phillips Climes M.D on 02/07/2018 at 3:56 PM  Between 7am to 7pm - Pager - [phone]  After 7pm go to [email or web address] - password [facility]  [facility] -  Office  [phone]

## 2018-02-07 NOTE — Progress Notes (Signed)
Assumed care of patient at 1615. Agree with previous Nurse assessment. Patient stable, in no acute distress, telemetry applied.  Barbee [place]ropshire. Brigitte Pulse, RN

## 2018-02-07 NOTE — Progress Notes (Signed)
Initial Nutrition Assessment  DOCUMENTATION CODES:   Severe malnutrition in context of chronic illness, Underweight  INTERVENTION:   - Once diet advanced, Boost Plus chocolate TID, each supplement provides 360 kcal and 14 grams of protein  - MVI with minerals daily (currently ordered)  - Continue to encourage adequate PO intake  NUTRITION DIAGNOSIS:   Severe Malnutrition related to chronic illness(COPD) as evidenced by moderate fat depletion, severe fat depletion, moderate muscle depletion, severe muscle depletion.  GOAL:   Patient will meet greater than or equal to 90% of their needs  MONITOR:   PO intake, Supplement acceptance, Diet advancement, Weight trends  REASON FOR ASSESSMENT:   Other (Comment)(underweight BMI)    ASSESSMENT:   77 year old female who presented to the ED with abdominal pain. PMH significant for severe COPD on 8 L oxygen, hypertension, GERD, hyperlipidemia, and SBO in 2016. CT of abdomen consistent with acute appendicitis. Pulmonary critical care found to be a high risk for surgery.  RN present providing nursing care at time of RD visit.  Spoke with pt at bedside who reports that her weight is "going up." Pt is surprised by this as she is not eating any differently. Pt reports that her UBW prior to weight loss was between 115-120 lbs. Since losing weight, pt reports she has been maintaining between 75-79 lbs. RD obtained bed weight at time of visit: 105.9 lbs. Weighed entered in chart.  Pt reports that she has a good appetite but that nausea prevents her from eating. Pt states that this has been going on "for a long while." Pt states that she eats 1 meal "over the course of a day" that might include soup, crackers, Boost Plus, and occasionally a burger. Pt states that her children are constantly bringing her food and encouraging her to eat. Pt amenable to receiving Boost Plus TID once diet advanced.  RD encouraged adequate PO intake.  Medications  reviewed and include: 100 mg Colace daily, 20 mg Pepcid daily, Boost Breeze BID, MVI with minerals daily, 80 mg Protonix daily, Miralax daily  Labs reviewed: hemoglobin 10.7 (L), HCT 33.5 (L)  NUTRITION - FOCUSED PHYSICAL EXAM:    Most Recent Value  Orbital Region  Moderate depletion  Upper Arm Region  Severe depletion  Thoracic and Lumbar Region  Severe depletion  Buccal Region  Moderate depletion  Temple Region  Moderate depletion  Clavicle Bone Region  Severe depletion  Clavicle and Acromion Bone Region  Severe depletion  Scapular Bone Region  Moderate depletion  Dorsal Hand  Moderate depletion  Patellar Region  Severe depletion  Anterior Thigh Region  Severe depletion  Posterior Calf Region  Severe depletion  Edema (RD Assessment)  None  Hair  Reviewed  Eyes  Reviewed  Mouth  Reviewed  Skin  Reviewed  Nails  Reviewed       Diet Order:   Diet Order           Diet NPO time specified Except for: Ice Chips, Sips with Meds, Other (See Comments)  Diet effective now          EDUCATION NEEDS:   No education needs have been identified at this time  Skin:  Skin Assessment: Reviewed RN Assessment  Last BM:  02/06/18  Height:   Ht Readings from Last 1 Encounters:  02/06/18 5' 3.5" (1.613 m)    Weight:   Wt Readings from Last 1 Encounters:  02/07/18 105 lb 14.4 oz (48 kg)    Ideal Body Weight:  53.41 kg  BMI:  Body mass index is 18.47 kg/m.  Estimated Nutritional Needs:   Kcal:  1400-1600 kcal/day  Protein:  70-85 grams/day  Fluid:  1.4-1.6 L/day    Gaynell Face, MS, RD, LDN Pager: [phone] Weekend/After Hours: [phone]

## 2018-02-07 NOTE — Progress Notes (Signed)
ED TO INPATIENT HANDOFF REPORT  Name/Age/Gender Tamara Adams 77 y.o. female  Code Status    Code Status Orders  (From admission, onward)        Start     Ordered   02/07/18 0022  Do not attempt resuscitation (DNR)  Continuous    Question Answer Comment  In the event of cardiac or respiratory ARREST Do not call a "code blue"   In the event of cardiac or respiratory ARREST Do not perform Intubation, CPR, defibrillation or ACLS   In the event of cardiac or respiratory ARREST Use medication by any route, position, wound care, and other measures to relive pain and suffering. May use oxygen, suction and manual treatment of airway obstruction as needed for comfort.      02/07/18 0021    Code Status History    Date Active Date Inactive Code Status Order ID Comments User Context   09/20/2017 1316 09/22/2017 1548 Full Code [id number]  Phillips Grout, MD Inpatient   07/21/2017 1338 07/27/2017 1749 Full Code [id number]  Dessa Phi, DO Inpatient   07/19/2017 0407 07/21/2017 1338 DNR [id number]  Etta Quill, DO Inpatient   03/30/2016 1944 04/03/2016 2010 DNR [id number]  Etta Quill, DO Inpatient   03/26/2016 0844 03/28/2016 2012 DNR [id number]  Reyne Dumas, MD Inpatient   03/25/2016 0254 03/26/2016 0844 Full Code [id number]  Norval Morton, MD Inpatient   12/02/2015 2204 12/06/2015 1743 Full Code [id number]  Ivor Costa, MD Inpatient   01/11/2015 2018 01/14/2015 1739 Full Code [id number]  Oswald Hillock, MD ED   12/24/2014 1840 12/29/2014 1311 Full Code [id number]  Coralie Keens, MD Inpatient   01/28/2014 0454 01/31/2014 1233 Full Code [id number]  Leighton Ruff, MD Inpatient   08/15/2013 0026 08/19/2013 1209 Full Code [id number]  Ralene Ok, MD ED      Home/SNF/Other Home  Chief Complaint abdominal pain  Level of Care/Admitting Diagnosis ED Disposition    ED Disposition Condition [facility] Area: [facility] [[id number]]  Level of Care: Med-Surg [16]  Diagnosis:  Appendicitis [160737]  Admitting Physician: CCS, [place]  Attending Physician: CCS, MD [[id number]]  Estimated length of stay: 3 - 4 days  Certification:: I certify this patient will need inpatient services for at least 2 midnights  PT Class (Do Not Modify): Inpatient [101]  PT Acc Code (Do Not Modify): Private [1]       Medical History Past Medical History:  Diagnosis Date  . Anemia    as a child  . Arthritis    "hands" (12/24/2014)  . Complication of anesthesia    " My blood gas dropped during surgery so I was left on a ventilator and in ICU for 3 days."  . COPD (chronic obstructive pulmonary disease) (HCC)    FVC 72%, FEV1 29%, FEV1 ratio 32% (very severe (COPD)  . COPD (chronic obstructive pulmonary disease) ([place])   . Degenerative disc disease   . Depression with anxiety   . Diverticulosis   . DVT (deep venous thrombosis) ([place])    "LLE; years ago after a surgery"  . Essential hypertension   . Fluttering sensation of heart    pt put on metoprolol as a result  . GERD (gastroesophageal reflux disease)    PMH  . H/O hiatal hernia   . Headache    "maybe weekly" (12/24/2014)  . Hyperlipidemia   . Hypertension   . Kidney stone   . Liver lesion   .  MI (myocardial infarction) ([place]) 07/19/2017  . On home oxygen therapy    "3L; sleep w/it; use it when I rest" (12/24/2014)  . Peptic ulcer   . Pinched nerve    in back  . Pneumonia   . PONV (postoperative nausea and vomiting)   . Shortness of breath dyspnea    with exertion  . Skipped heart beats   . Small bowel obstruction ([place])    "several times; OR twice" (12/24/2014)  . Tubular adenoma of colon 09/2011  . Wears glasses     Allergies Allergies  Allergen Reactions  . Tyloxapol Itching  . Maxidex [Dexamethasone] Other (See Comments)    DEHYDRATION AND HEART RACING  . Advair Diskus [Fluticasone-Salmeterol] Other (See Comments)    No benefit with lungs (INEFFECTIVE)  . Remeron [Mirtazapine] Other (See Comments)    CAUSED  NIGHTMARES  . Trazodone And Nefazodone Other (See Comments)    Heart pounding  . Tylox [Oxycodone-Acetaminophen] Itching    IV Location/Drains/Wounds Patient Lines/Drains/Airways Status   Active Line/Drains/Airways    Name:   Placement date:   Placement time:   Site:   Days:   Peripheral IV 02/06/18 Right Antecubital   02/06/18    1657    Antecubital   1   Pressure Injury 09/20/17 Stage I -  Intact skin with non-blanchable redness of a localized area usually over a bony prominence. small healing sacral wound   09/20/17    1308     140   Pressure Injury 09/20/17 Stage II -  Partial thickness loss of dermis presenting as a shallow open ulcer with a red, pink wound bed without slough. healing pressure injury   09/20/17    1308     140          Labs/Imaging Results for orders placed or performed during the hospital encounter of 02/06/18 (from the past 48 hour(s))  Comprehensive metabolic panel     Status: Abnormal   Collection Time: 02/06/18  4:56 PM  Result Value Ref Range   Sodium 133 (L) 135 - 145 mmol/L   Potassium 4.3 3.5 - 5.1 mmol/L   Chloride 93 (L) 98 - 111 mmol/L    Comment: Please note change in reference range.   CO2 29 22 - 32 mmol/L   Glucose, Bld 101 (H) 70 - 99 mg/dL    Comment: Please note change in reference range.   BUN 8 8 - 23 mg/dL    Comment: Please note change in reference range.   Creatinine, Ser 0.73 0.44 - 1.00 mg/dL   Calcium 8.6 (L) 8.9 - 10.3 mg/dL   Total Protein 6.8 6.5 - 8.1 g/dL   Albumin 3.6 3.5 - 5.0 g/dL   AST 21 15 - 41 U/L   ALT 12 0 - 44 U/L    Comment: Please note change in reference range.   Alkaline Phosphatase 72 38 - 126 U/L   Total Bilirubin 0.2 (L) 0.3 - 1.2 mg/dL   GFR calc non Af Amer >60 >60 mL/min   GFR calc Af Amer >60 >60 mL/min    Comment: (NOTE) The eGFR has been calculated using the CKD EPI equation. This calculation has not been validated in all clinical situations. eGFR's persistently <60 mL/min signify possible  Chronic Kidney Disease.    Anion gap 11 5 - 15    Comment: Performed at [facility], [address] [place], [place] [address]  Lipase, blood     Status: None  Collection Time: 02/06/18  4:56 PM  Result Value Ref Range   Lipase 33 11 - 51 U/L    Comment: Performed at [facility], [address] [place], [place] [address]  CBC with Differential     Status: Abnormal   Collection Time: 02/06/18  4:56 PM  Result Value Ref Range   WBC 10.9 (H) 4.0 - 10.5 K/uL   RBC 3.93 3.87 - 5.11 MIL/uL   Hemoglobin 12.1 12.0 - 15.0 g/dL   HCT 36.9 36.0 - 46.0 %   MCV 93.9 78.0 - 100.0 fL   MCH 30.8 26.0 - 34.0 pg   MCHC 32.8 30.0 - 36.0 g/dL   RDW 12.3 11.5 - 15.5 %   Platelets 325 150 - 400 K/uL   Neutrophils Relative % 63 %   Neutro Abs 6.9 1.7 - 7.7 K/uL   Lymphocytes Relative 15 %   Lymphs Abs 1.6 0.7 - 4.0 K/uL   Monocytes Relative 10 %   Monocytes Absolute 1.1 (H) 0.1 - 1.0 K/uL   Eosinophils Relative 11 %   Eosinophils Absolute 1.2 (H) 0.0 - 0.7 K/uL   Basophils Relative 1 %   Basophils Absolute 0.1 0.0 - 0.1 K/uL    Comment: Performed at [facility], [address] [place], [place] [address]   Ct Abdomen Pelvis W Contrast  Result Date: 02/06/2018 CLINICAL DATA:  Generalized abdominal pain for the past 3 days. EXAM: CT ABDOMEN AND PELVIS WITH CONTRAST TECHNIQUE: Multidetector CT imaging of the abdomen and pelvis was performed using the standard protocol following bolus administration of intravenous contrast. CONTRAST:  130m ISOVUE-300 IOPAMIDOL (ISOVUE-300) INJECTION 61% COMPARISON:  PET-CT dated January 08, 2018. CT abdomen pelvis dated July 18, 2017. FINDINGS: Lower chest: No acute abnormality. Hepatobiliary: Small hypodense lesions in the left and right hepatic lobes measuring up to 1.1 cm are unchanged, likely small cysts. There is a 1.3 cm discontinuous arterially enhancing lesion in the inferior right hepatic lobe which demonstrates enhancement  on delayed images, consistent with a hemangioma. Status post cholecystectomy. Unchanged mild central intrahepatic biliary dilatation. Pancreas: Unremarkable. Prominence of the main pancreatic duct is similar to prior studies. No surrounding inflammatory changes. Spleen: Normal in size without focal abnormality. Adrenals/Urinary Tract: Adrenal glands are unremarkable. Kidneys are normal, without renal calculi, focal lesion, or hydronephrosis. Bladder is moderately distended. Stomach/Bowel: Appendix is fluid-filled and dilated, containing several appendicoliths, with mild periappendiceal inflammatory changes. Appendix: Location: Medial to the cecum coursing posteriorly. Diameter: 11 mm. Appendicolith: One at the tip and one at the base. Mucosal hyper-enhancement: Present. Extraluminal gas: None. Periappendiceal collection: None. Stomach is within normal limits. No evidence of bowel wall thickening, distention, or inflammatory changes. Left-sided colonic diverticulosis. Vascular/Lymphatic: Aortic atherosclerosis. No enlarged abdominal or pelvic lymph nodes. Reproductive: Status post hysterectomy. No adnexal masses. Other: Small bilateral fat containing inguinal hernias. No free fluid or pneumoperitoneum. Musculoskeletal: No acute or significant osseous findings. Unchanged bilateral L5 pars defects. IMPRESSION: 1. Acute appendicitis.  No perforation or abscess. 2.  Aortic atherosclerosis (ICD10-I70.0). Electronically Signed   By: WTitus DubinM.D.   On: 02/06/2018 18:25   Dg Chest Port 1 View  Result Date: 02/06/2018 CLINICAL DATA:  Preop for appendicitis.  Dyspnea. EXAM: PORTABLE CHEST 1 VIEW COMPARISON:  07/24/2017 CXR, PET-CT 01/08/2018 FINDINGS: Ill-defined left apical opacity is noted that may represent stereotactic body radiation therapy change for known metabolically active left apical pulmonary nodule as seen on prior PET-CT. Emphysematous hyperinflation of the  lungs without acute pulmonary consolidation  or CHF. No effusion is identified though the right lateral costophrenic angle is minimally excluded. Heart size is within normal limits. Moderate aortic atherosclerosis is seen. IMPRESSION: Emphysematous hyperinflation of the lungs with post treatment change felt to account for the ill-defined opacity at the left lung apex post SBRT. No acute pulmonary consolidation suspicious for pneumonia. Electronically Signed   By: Ashley Royalty M.D.   On: 02/06/2018 23:12    Pending Labs [employer] (From admission, onward)   Start     Ordered   Signed and Occupational hygienist morning,   R     Signed and Held   Signed and Held  CBC  Tomorrow morning,   R     Signed and Held      Vitals/Pain Today's Vitals   02/06/18 2316 02/06/18 2331 02/06/18 2351 02/07/18 0023  BP:    (!) 169/69  Pulse:    85  Resp:    15  Temp:      TempSrc:      SpO2:    100%  Weight:      Height:      PainSc: 10-Worst pain ever 10-Worst pain ever 6      Isolation Precautions No active isolations  Medications Medications  piperacillin-tazobactam (ZOSYN) IVPB 3.375 g (has no administration in time range)  HYDROmorphone (DILAUDID) injection 1 mg (1 mg Intravenous Given 02/06/18 1655)  ondansetron (ZOFRAN) injection 4 mg (4 mg Intravenous Given 02/06/18 1655)  sodium chloride 0.9 % bolus 1,000 mL (0 mLs Intravenous Stopped 02/06/18 1840)  iopamidol (ISOVUE-300) 61 % injection 100 mL (100 mLs Intravenous Contrast Given 02/06/18 1742)  cefTRIAXone (ROCEPHIN) 1 g in sodium chloride 0.9 % 100 mL IVPB (0 g Intravenous Stopped 02/06/18 1924)  metroNIDAZOLE (FLAGYL) IVPB 500 mg (0 mg Intravenous Stopped 02/06/18 2032)  HYDROmorphone (DILAUDID) injection 1 mg (1 mg Intravenous Given 02/06/18 1903)  HYDROmorphone (DILAUDID) injection 1 mg (1 mg Intravenous Given 02/06/18 2043)  piperacillin-tazobactam (ZOSYN) IVPB 3.375 g (0 g Intravenous Stopped 02/06/18 2332)  ondansetron (ZOFRAN) injection 4 mg (4 mg Intravenous  Given 02/06/18 2302)  HYDROmorphone (DILAUDID) injection 1 mg (1 mg Intravenous Given 02/06/18 2330)    Mobility walks '

## 2018-02-07 NOTE — Progress Notes (Signed)
PHARMACY NOTE:  ANTIMICROBIAL RENAL DOSAGE ADJUSTMENT  Current antimicrobial regimen includes a mismatch between antimicrobial dosage and estimated renal function.  As per policy approved by the Pharmacy & Therapeutics and Medical Executive Committees, the antimicrobial dosage will be adjusted accordingly.  Current antimicrobial dosage:  Cefepime 2gm IV q8h  Indication: acute appendicitis  Renal Function:  Estimated Creatinine Clearance: 42.4 mL/min (by C-G formula based on SCr of 0.73 mg/dL). []      On intermittent HD, scheduled: []      On CRRT    Antimicrobial dosage has been changed to:  1gm q12h  Additional comments:   Thank you for allowing pharmacy to be a part of this patient's care.  Dolly Rias RPh 02/07/2018, 1:19 AM Pager [phone]

## 2018-02-08 LAB — BASIC METABOLIC PANEL
Anion gap: 6 (ref 5–15)
BUN: 12 mg/dL (ref 8–23)
CO2: 32 mmol/L (ref 22–32)
Calcium: 8.7 mg/dL — ABNORMAL LOW (ref 8.9–10.3)
Chloride: 103 mmol/L (ref 98–111)
Creatinine, Ser: 0.72 mg/dL (ref 0.44–1.00)
GFR calc Af Amer: >60 mL/min (ref >60)
Glucose, Bld: 99 mg/dL (ref 70–99)
Potassium: 4 mmol/L (ref 3.5–5.1)
SODIUM: 141 mmol/L (ref 135–145)

## 2018-02-08 LAB — CBC
HCT: 31.1 % — ABNORMAL LOW (ref 36.0–46.0)
Hemoglobin: 9.9 g/dL — ABNORMAL LOW (ref 12.0–15.0)
MCH: 30.7 pg (ref 26.0–34.0)
MCHC: 31.8 g/dL (ref 30.0–36.0)
MCV: 96.3 fL (ref 78.0–100.0)
PLATELETS: 312 10*3/uL (ref 150–400)
RBC: 3.23 MIL/uL — AB (ref 3.87–5.11)
RDW: 13.1 % (ref 11.5–15.5)
WBC: 9.5 10*3/uL (ref 4.0–10.5)

## 2018-02-08 MED ORDER — SODIUM CHLORIDE 0.9 % IV SOLN
INTRAVENOUS | Status: DC
  Administered 2018-02-08 – 2018-02-09 (×2): via INTRAVENOUS

## 2018-02-08 NOTE — Progress Notes (Signed)
[facility] Office:  [phone] General Surgery Progress Note   LOS: 2 days  POD -     Chief Complaint: Abdominal pain  Assessment and Plan: 1.  Appendicitis (she's had multiple prior abdominal operations - would be difficult surgery)  To manage medically because of high risk of surgery  WBC - 9,500 - 02/08/2018  On Cefepime/Flagyl  The tenderness is some better.  She needs to get out of bed and ambulate - it seems she's been in bed since admission.  2.  Chronic hypoxia - severe COPD (on 8L home O2)  On chronic prednisone 3.  CAD with STEMI 07/2017   4.  HTN 5.  H/o A fib 6.  Anticoagulated  On plavix 7.  Lung cancer - undergoing RT 8.  DVT prophylaxis - Lovenox   Principal Problem:   Appendicitis Active Problems:   End stage COPD ([place])   Chronic hypoxemic respiratory failure (HCC)   Essential hypertension   Paroxysmal atrial fibrillation (HCC)  Subjective:  Pain somewhat better.  Still tender in the RLQ.  Her husband has Alzheimer's disease and lives in [place].  Objective:   Vitals:   02/08/18 0829 02/08/18 1029  BP:  (!) 155/63  Pulse:  89  Resp:  18  Temp:  98.5 F (36.9 C)  SpO2: 99% 100%     Intake/Output from previous day:  07/19 0701 - 07/20 0700 In: 1468.8 [P.O.:300; I.V.:728.8; IV Piggyback:440] Out: 250 [Urine:250]  Intake/Output this shift:  No intake/output data recorded.   Physical Exam:   General: Thin older WF who is alert and oriented.   She is on nasal O2   HEENT: Normal. Pupils equal. .   Lungs: Clear.   Abdomen: Midline scar.  Tender RLQ.   Lab Results:    Recent Labs    02/07/18 0449 02/08/18 0422  WBC 13.2* 9.5  HGB 10.7* 9.9*  HCT 33.5* 31.1*  PLT 356 312    BMET   Recent Labs    02/07/18 0449 02/08/18 0422  NA 139 141  K 3.9 4.0  CL 99 103  CO2 31 32  GLUCOSE 123* 99  BUN 9 12  CREATININE 0.76 0.72  CALCIUM 8.6* 8.7*    PT/INR  No results for input(s): LABPROT, INR in the last 72  hours.  ABG  No results for input(s): PHART, HCO3 in the last 72 hours.  Invalid input(s): PCO2, PO2   Studies/Results:  Ct Abdomen Pelvis W Contrast  Result Date: 02/06/2018 CLINICAL DATA:  Generalized abdominal pain for the past 3 days. EXAM: CT ABDOMEN AND PELVIS WITH CONTRAST TECHNIQUE: Multidetector CT imaging of the abdomen and pelvis was performed using the standard protocol following bolus administration of intravenous contrast. CONTRAST:  130mL ISOVUE-300 IOPAMIDOL (ISOVUE-300) INJECTION 61% COMPARISON:  PET-CT dated January 08, 2018. CT abdomen pelvis dated July 18, 2017. FINDINGS: Lower chest: No acute abnormality. Hepatobiliary: Small hypodense lesions in the left and right hepatic lobes measuring up to 1.1 cm are unchanged, likely small cysts. There is a 1.3 cm discontinuous arterially enhancing lesion in the inferior right hepatic lobe which demonstrates enhancement on delayed images, consistent with a hemangioma. Status post cholecystectomy. Unchanged mild central intrahepatic biliary dilatation. Pancreas: Unremarkable. Prominence of the main pancreatic duct is similar to prior studies. No surrounding inflammatory changes. Spleen: Normal in size without focal abnormality. Adrenals/Urinary Tract: Adrenal glands are unremarkable. Kidneys are normal, without renal calculi, focal lesion, or hydronephrosis. Bladder is moderately distended. Stomach/Bowel: Appendix is fluid-filled  and dilated, containing several appendicoliths, with mild periappendiceal inflammatory changes. Appendix: Location: Medial to the cecum coursing posteriorly. Diameter: 11 mm. Appendicolith: One at the tip and one at the base. Mucosal hyper-enhancement: Present. Extraluminal gas: None. Periappendiceal collection: None. Stomach is within normal limits. No evidence of bowel wall thickening, distention, or inflammatory changes. Left-sided colonic diverticulosis. Vascular/Lymphatic: Aortic atherosclerosis. No enlarged  abdominal or pelvic lymph nodes. Reproductive: Status post hysterectomy. No adnexal masses. Other: Small bilateral fat containing inguinal hernias. No free fluid or pneumoperitoneum. Musculoskeletal: No acute or significant osseous findings. Unchanged bilateral L5 pars defects. IMPRESSION: 1. Acute appendicitis.  No perforation or abscess. 2.  Aortic atherosclerosis (ICD10-I70.0). Electronically Signed   By: Titus Dubin M.D.   On: 02/06/2018 18:25   Dg Chest Port 1 View  Result Date: 02/06/2018 CLINICAL DATA:  Preop for appendicitis.  Dyspnea. EXAM: PORTABLE CHEST 1 VIEW COMPARISON:  07/24/2017 CXR, PET-CT 01/08/2018 FINDINGS: Ill-defined left apical opacity is noted that may represent stereotactic body radiation therapy change for known metabolically active left apical pulmonary nodule as seen on prior PET-CT. Emphysematous hyperinflation of the lungs without acute pulmonary consolidation or CHF. No effusion is identified though the right lateral costophrenic angle is minimally excluded. Heart size is within normal limits. Moderate aortic atherosclerosis is seen. IMPRESSION: Emphysematous hyperinflation of the lungs with post treatment change felt to account for the ill-defined opacity at the left lung apex post SBRT. No acute pulmonary consolidation suspicious for pneumonia. Electronically Signed   By: Ashley Royalty M.D.   On: 02/06/2018 23:12     Anti-infectives:   Anti-infectives (From admission, onward)   Start     Dose/Rate Route Frequency Ordered Stop   02/07/18 0600  ceFEPIme (MAXIPIME) 2 g in sodium chloride 0.9 % 100 mL IVPB  Status:  Discontinued     2 g 200 mL/hr over 30 Minutes Intravenous Every 12 hours 02/07/18 0106 02/07/18 0122   02/07/18 0600  ceFEPIme (MAXIPIME) 1 g in sodium chloride 0.9 % 100 mL IVPB     1 g 200 mL/hr over 30 Minutes Intravenous Every 12 hours 02/07/18 0122     02/07/18 0400  metroNIDAZOLE (FLAGYL) IVPB 500 mg  Status:  Discontinued     500 mg 100 mL/hr over  60 Minutes Intravenous Every 8 hours 02/07/18 0106 02/07/18 0122   02/07/18 0400  metroNIDAZOLE (FLAGYL) IVPB 500 mg     500 mg 100 mL/hr over 60 Minutes Intravenous Every 8 hours 02/07/18 0122     02/07/18 0030  piperacillin-tazobactam (ZOSYN) IVPB 3.375 g     3.375 g 100 mL/hr over 30 Minutes Intravenous  Once 02/07/18 0017 02/07/18 0120   02/06/18 2245  piperacillin-tazobactam (ZOSYN) IVPB 3.375 g     3.375 g 100 mL/hr over 30 Minutes Intravenous  Once 02/06/18 2240 02/06/18 2332   02/06/18 1845  cefTRIAXone (ROCEPHIN) 1 g in sodium chloride 0.9 % 100 mL IVPB     1 g 200 mL/hr over 30 Minutes Intravenous  Once 02/06/18 1837 02/06/18 1924   02/06/18 1845  metroNIDAZOLE (FLAGYL) IVPB 500 mg     500 mg 100 mL/hr over 60 Minutes Intravenous  Once 02/06/18 1837 02/06/18 2032      Alphonsa Overall, MD, FACS Pager: [facility] Office: [phone] 02/08/2018

## 2018-02-08 NOTE — Progress Notes (Signed)
PROGRESS NOTE                                                                                                                                                                                                             Patient Demographics:    Tamara Adams, is a 77 y.o. female, DOB - 1940/10/30, GGY:[id number]  Admit date - 02/06/2018   Admitting Physician Md Edison Pace, MD  Outpatient Primary MD for the patient is Tamara Marry, MD  LOS - 2   Chief Complaint  Patient presents with  . Abdominal Pain  . Shortness of Breath       Brief Narrative     77 y.o. female WITH  History of severe COPD on 8 L oxygen, hypertension, CAD resents to the ER because of worsening abdominal pain.    Work-up significant for acute appendicitis , currently no operative management has been pursued given the has been advanced COPD, and her DNR/DNI status.   Subjective:    [name] today has, No headache, No chest pain, reports abdominal pain,   Assessment  & Plan :    Principal Problem:   Appendicitis Active Problems:   End stage COPD ([place])   Chronic hypoxemic respiratory failure (HCC)   Essential hypertension   Paroxysmal atrial fibrillation (HCC)   Acute appendicitis -Management per primary surgical team, but she is high risk for surgery, as well she is known to have advanced COPD, expected to be very difficult to extubate after surgery, she did not want trach . -So current management with antibiotics, she is afebrile, leukocytosis has normalized, appears to be improving, but remains with significant abdominal pain.  Severe COPD with chronic respiratory failure on 8 L oxygen.  -Appears to be at baseline at this point, 10 you with nebs, and home dose prednisone, -Has been seen by PCCM.  Hypertension -Overall uncontrolled, but much improved after adding Norvasc, continue with Cozaar and metoprolol.  History of CAD -She is on Plavix and metoprolol,  currently Plavix on hold heme in case she will end up needing procedure/drain.  D/W nursing staff, out of bed to chair, will provide incentive spirometry and try to ambulate today, started on IV NS 50 cc/h.    Code Status : DNR, confirmed by the patient  Family Communication  : None at bedside  Procedures  :  none  DVT Prophylaxis  :  [place] lovenox  Lab Results  Component Value Date   PLT 312 02/08/2018    Antibiotics  :    Anti-infectives (From admission, onward)   Start     Dose/Rate Route Frequency Ordered Stop   02/07/18 0600  ceFEPIme (MAXIPIME) 2 g in sodium chloride 0.9 % 100 mL IVPB  Status:  Discontinued     2 g 200 mL/hr over 30 Minutes Intravenous Every 12 hours 02/07/18 0106 02/07/18 0122   02/07/18 0600  ceFEPIme (MAXIPIME) 1 g in sodium chloride 0.9 % 100 mL IVPB     1 g 200 mL/hr over 30 Minutes Intravenous Every 12 hours 02/07/18 0122     02/07/18 0400  metroNIDAZOLE (FLAGYL) IVPB 500 mg  Status:  Discontinued     500 mg 100 mL/hr over 60 Minutes Intravenous Every 8 hours 02/07/18 0106 02/07/18 0122   02/07/18 0400  metroNIDAZOLE (FLAGYL) IVPB 500 mg     500 mg 100 mL/hr over 60 Minutes Intravenous Every 8 hours 02/07/18 0122     02/07/18 0030  piperacillin-tazobactam (ZOSYN) IVPB 3.375 g     3.375 g 100 mL/hr over 30 Minutes Intravenous  Once 02/07/18 0017 02/07/18 0120   02/06/18 2245  piperacillin-tazobactam (ZOSYN) IVPB 3.375 g     3.375 g 100 mL/hr over 30 Minutes Intravenous  Once 02/06/18 2240 02/06/18 2332   02/06/18 1845  cefTRIAXone (ROCEPHIN) 1 g in sodium chloride 0.9 % 100 mL IVPB     1 g 200 mL/hr over 30 Minutes Intravenous  Once 02/06/18 1837 02/06/18 1924   02/06/18 1845  metroNIDAZOLE (FLAGYL) IVPB 500 mg     500 mg 100 mL/hr over 60 Minutes Intravenous  Once 02/06/18 1837 02/06/18 2032        Objective:   Vitals:   02/08/18 0428 02/08/18 0827 02/08/18 0829 02/08/18 1029  BP: (!) 149/90   (!) 155/63  Pulse: 70   89  Resp: 16   18    Temp: 98 F (36.7 C)   98.5 F (36.9 C)  TempSrc: Oral   Oral  SpO2: 100% 99% 99% 100%  Weight:      Height:        Wt Readings from Last 3 Encounters:  02/07/18 48 kg (105 lb 14.4 oz)  01/27/18 45 kg (99 lb 3.2 oz)  01/13/18 43.5 kg (96 lb)     Intake/Output Summary (Last 24 hours) at 02/08/2018 1051 Last data filed at 02/08/2018 0600 Gross per 24 hour  Intake 1363 ml  Output 250 ml  Net 1113 ml     Physical Exam  Awake Alert, Oriented X 3, No new F.N deficits, Normal affect Symmetrical Chest wall movement, Good air movement bilaterally, CTAB RRR,No Gallops,Rubs or new Murmurs, No Parasternal Heave +ve B.Sounds, patient with significant right lower quadrant tenderness with palpation, with some guarding No Cyanosis, Clubbing or edema, No new Rash or bruise      Data Review:    CBC Recent Labs  Lab 02/06/18 1656 02/07/18 0449 02/08/18 0422  WBC 10.9* 13.2* 9.5  HGB 12.1 10.7* 9.9*  HCT 36.9 33.5* 31.1*  PLT 325 356 312  MCV 93.9 95.7 96.3  MCH 30.8 30.6 30.7  MCHC 32.8 31.9 31.8  RDW 12.3 13.2 13.1  LYMPHSABS 1.6  --   --   MONOABS 1.1*  --   --   EOSABS 1.2*  --   --   BASOSABS 0.1  --   --  Chemistries  Recent Labs  Lab 02/06/18 1656 02/07/18 0449 02/08/18 0422  NA 133* 139 141  K 4.3 3.9 4.0  CL 93* 99 103  CO2 29 31 32  GLUCOSE 101* 123* 99  BUN 8 9 12   CREATININE 0.73 0.76 0.72  CALCIUM 8.6* 8.6* 8.7*  AST 21  --   --   ALT 12  --   --   ALKPHOS 72  --   --   BILITOT 0.2*  --   --    ------------------------------------------------------------------------------------------------------------------ No results for input(s): CHOL, HDL, LDLCALC, TRIG, CHOLHDL, LDLDIRECT in the last 72 hours.  Lab Results  Component Value Date   HGBA1C 5.7 10/08/2016   ------------------------------------------------------------------------------------------------------------------ No results for input(s): TSH, T4TOTAL, T3FREE, THYROIDAB in the last  72 hours.  Invalid input(s): FREET3 ------------------------------------------------------------------------------------------------------------------ No results for input(s): VITAMINB12, FOLATE, FERRITIN, TIBC, IRON, RETICCTPCT in the last 72 hours.  Coagulation profile No results for input(s): INR, PROTIME in the last 168 hours.  No results for input(s): DDIMER in the last 72 hours.  Cardiac Enzymes No results for input(s): CKMB, TROPONINI, MYOGLOBIN in the last 168 hours.  Invalid input(s): CK ------------------------------------------------------------------------------------------------------------------    Component Value Date/Time   BNP 54.3 03/30/2016 1400    Inpatient Medications  Scheduled Meds: . amLODipine  10 mg Oral Daily  . docusate sodium  100 mg Oral Daily  . dronabinol  5 mg Oral BID  . enoxaparin (LOVENOX) injection  30 mg Subcutaneous Q24H  . famotidine  20 mg Oral Daily  . fluticasone furoate-vilanterol  1 puff Inhalation Daily  . guaiFENesin  600 mg Oral BID  . lactose free nutrition  237 mL Oral TID WC  . losartan  100 mg Oral Daily  . metoprolol tartrate  50 mg Oral BID  . multivitamin with minerals  1 tablet Oral Daily  . pantoprazole  80 mg Oral Daily  . polyethylene glycol  17 g Oral Daily  . predniSONE  10 mg Oral Q breakfast  . umeclidinium bromide  1 puff Inhalation Daily   Continuous Infusions: . sodium chloride 50 mL/hr at 02/08/18 0929  . ceFEPime (MAXIPIME) IV Stopped (02/08/18 6503)   And  . metronidazole Stopped (02/08/18 0540)   PRN Meds:.sodium chloride, acetaminophen, albuterol, diphenhydrAMINE **OR** diphenhydrAMINE, HYDROcodone-acetaminophen, lidocaine, morphine injection, ondansetron **OR** ondansetron (ZOFRAN) IV, promethazine, traMADol  Micro Results No results found for this or any previous visit (from the past 240 hour(s)).  Radiology Reports Ct Abdomen Pelvis W Contrast  Result Date: 02/06/2018 CLINICAL DATA:   Generalized abdominal pain for the past 3 days. EXAM: CT ABDOMEN AND PELVIS WITH CONTRAST TECHNIQUE: Multidetector CT imaging of the abdomen and pelvis was performed using the standard protocol following bolus administration of intravenous contrast. CONTRAST:  11mL ISOVUE-300 IOPAMIDOL (ISOVUE-300) INJECTION 61% COMPARISON:  PET-CT dated January 08, 2018. CT abdomen pelvis dated July 18, 2017. FINDINGS: Lower chest: No acute abnormality. Hepatobiliary: Small hypodense lesions in the left and right hepatic lobes measuring up to 1.1 cm are unchanged, likely small cysts. There is a 1.3 cm discontinuous arterially enhancing lesion in the inferior right hepatic lobe which demonstrates enhancement on delayed images, consistent with a hemangioma. Status post cholecystectomy. Unchanged mild central intrahepatic biliary dilatation. Pancreas: Unremarkable. Prominence of the main pancreatic duct is similar to prior studies. No surrounding inflammatory changes. Spleen: Normal in size without focal abnormality. Adrenals/Urinary Tract: Adrenal glands are unremarkable. Kidneys are normal, without renal calculi, focal lesion, or hydronephrosis. Bladder is moderately distended. Stomach/Bowel: Appendix  is fluid-filled and dilated, containing several appendicoliths, with mild periappendiceal inflammatory changes. Appendix: Location: Medial to the cecum coursing posteriorly. Diameter: 11 mm. Appendicolith: One at the tip and one at the base. Mucosal hyper-enhancement: Present. Extraluminal gas: None. Periappendiceal collection: None. Stomach is within normal limits. No evidence of bowel wall thickening, distention, or inflammatory changes. Left-sided colonic diverticulosis. Vascular/Lymphatic: Aortic atherosclerosis. No enlarged abdominal or pelvic lymph nodes. Reproductive: Status post hysterectomy. No adnexal masses. Other: Small bilateral fat containing inguinal hernias. No free fluid or pneumoperitoneum. Musculoskeletal: No acute  or significant osseous findings. Unchanged bilateral L5 pars defects. IMPRESSION: 1. Acute appendicitis.  No perforation or abscess. 2.  Aortic atherosclerosis (ICD10-I70.0). Electronically Signed   By: Titus Dubin M.D.   On: 02/06/2018 18:25   Dg Chest Port 1 View  Result Date: 02/06/2018 CLINICAL DATA:  Preop for appendicitis.  Dyspnea. EXAM: PORTABLE CHEST 1 VIEW COMPARISON:  07/24/2017 CXR, PET-CT 01/08/2018 FINDINGS: Ill-defined left apical opacity is noted that may represent stereotactic body radiation therapy change for known metabolically active left apical pulmonary nodule as seen on prior PET-CT. Emphysematous hyperinflation of the lungs without acute pulmonary consolidation or CHF. No effusion is identified though the right lateral costophrenic angle is minimally excluded. Heart size is within normal limits. Moderate aortic atherosclerosis is seen. IMPRESSION: Emphysematous hyperinflation of the lungs with post treatment change felt to account for the ill-defined opacity at the left lung apex post SBRT. No acute pulmonary consolidation suspicious for pneumonia. Electronically Signed   By: Ashley Royalty M.D.   On: 02/06/2018 23:12    Phillips Climes M.D on 02/08/2018 at 10:51 AM  Between 7am to 7pm - Pager - [phone]  After 7pm go to [email or web address] - password [facility]  [facility] -  Office  [phone]

## 2018-02-09 NOTE — Evaluation (Signed)
Physical Therapy Evaluation Patient Details Name: Tamara Adams MRN: [id number] DOB: 1940-12-08 Today's Date: 02/09/2018   History of Present Illness  02/06/2018 from home with acute on chronic abdominal pain for 3-4 days prior to admission.  Patient has a past medical history significant for arthritis, COPD (home oxygen use 8 L nasal cannula), hypertension, GERD, hyperlipidemia, MI (06/2017), IDA, tobacco use, left lung cancer s/p radiation, and small bowel obstruction. On presentation to ER patient complained of right lower quadrant pain with nausea. CT abdomen showed acute appendicitis. She was seen by Pulmonary critical care and was deemed to be a very high risk for surgery and the plan was made to manager patient conservatively with antibiotics  Clinical Impression  Pt admitted with above diagnosis. Pt currently with functional limitations due to the deficits listed below (see PT Problem List). Pt ambulated 8' with RW, SaO2 97% on 8L O2 HFNC, no loss of balance while ambulating.  Pt will benefit from skilled PT to increase their independence and safety with mobility to allow discharge to the venue listed below.       Follow Up Recommendations No PT follow up    Equipment Recommendations  None recommended by PT    Recommendations for Other Services       Precautions / Restrictions Precautions Precautions: None Precaution Comments: no falls in past 1 year, on 8L home O2 Restrictions Weight Bearing Restrictions: No      Mobility  Bed Mobility Overal bed mobility: Modified Independent             General bed mobility comments: HOB up, used rail  Transfers Overall transfer level: Needs assistance Equipment used: Rolling walker (2 wheeled) Transfers: Sit to/from Stand Sit to Stand: Supervision         General transfer comment: no physical assist, no loss of balance  Ambulation/Gait Ambulation/Gait assistance: Supervision Gait Distance (Feet): 70 Feet Assistive  device: Rolling walker (2 wheeled) Gait Pattern/deviations: Step-through pattern;Decreased stride length Gait velocity: WFL   General Gait Details: SaO2 97% on 8L O2 HFNC, no loss of balance, distance limited by fatigue  Stairs            Wheelchair Mobility    Modified Rankin (Stroke Patients Only)       Balance Overall balance assessment: Modified Independent                                           Pertinent Vitals/Pain      Home Living Family/patient expects to be discharged to:: Private residence Living Arrangements: Children;Other relatives Available Help at Discharge: Family;Available PRN/intermittently(family is there majority of the time, they work) Type of Home: House Home Access: Level entry     Home Layout: One [place]: Bedside commode;Shower seat;Walker - 4 wheels      Prior Function Level of Independence: Independent with assistive device(s)         Comments: independent with ADLs     Hand Dominance        Extremity/Trunk Assessment   Upper Extremity Assessment Upper Extremity Assessment: Overall WFL for tasks assessed    Lower Extremity Assessment Lower Extremity Assessment: Overall WFL for tasks assessed    Cervical / Trunk Assessment Cervical / Trunk Assessment: Kyphotic  Communication   Communication: No difficulties  Cognition Arousal/Alertness: Awake/alert Behavior During Therapy: WFL for tasks assessed/performed Overall Cognitive Status: Within Functional Limits  for tasks assessed                                        General Comments      Exercises     Assessment/Plan    PT Assessment Patient needs continued PT services  PT Problem List Decreased activity tolerance       PT Treatment Interventions Gait training;Functional mobility training;Therapeutic activities;Patient/family education    PT Goals (Current goals can be found in the Care Plan section)  Acute  Rehab PT Goals Patient Stated Goal: care for 77 year old great grand daughter PT Goal Formulation: With patient Time For Goal Achievement: 02/23/18 Potential to Achieve Goals: Good    Frequency Min 3X/week   Barriers to discharge        Co-evaluation               AM-PAC PT "6 Clicks" Daily Activity  Outcome Measure Difficulty turning over in bed (including adjusting bedclothes, sheets and blankets)?: A Little Difficulty moving from lying on back to sitting on the side of the bed? : A Little Difficulty sitting down on and standing up from a chair with arms (e.g., wheelchair, bedside commode, etc,.)?: A Little Help needed moving to and from a bed to chair (including a wheelchair)?: None Help needed walking in hospital room?: A Little Help needed climbing 3-5 steps with a railing? : A Little 6 Click Score: 19    End of Session Equipment Utilized During Treatment: Gait belt;Oxygen Activity Tolerance: Patient tolerated treatment well Patient left: in chair;with call bell/phone within reach Nurse Communication: Mobility status PT Visit Diagnosis: Difficulty in walking, not elsewhere classified (R26.2)    Time: 3524-8185 PT Time Calculation (min) (ACUTE ONLY): 19 min   Charges:   PT Evaluation $PT Eval Low Complexity: 1 Low     PT G Codes:          Philomena Doheny 02/09/2018, 12:31 PM [phone]

## 2018-02-09 NOTE — Progress Notes (Addendum)
[facility]:  [phone] General Surgery Progress Note   LOS: 3 days  POD -     Chief Complaint: Abdominal pain  Assessment and Plan: 1.  Appendicitis (she's had multiple prior abdominal operations - would be difficult surgery)  To manage medically because of high risk of surgery  WBC - 9,500 - 02/08/2018, no labs today  On Cefepime/Flagyl  A little better today.  Still has not been out of bed to toilet.  To get PT, but it is "Sunday.  To increase to clear liquids.  2.  Chronic hypoxia - severe COPD (on 8L home O2)  On chronic prednisone 3.  CAD with STEMI 07/2017   4.  HTN 5.  H/o A fib 6.  Anticoagulated  On plavix 7.  Lung cancer - undergoing RT 8.  Chronic constipation - on miralax and colace at home 9.  DVT prophylaxis - Lovenox   Principal Problem:   Appendicitis Active Problems:   End stage COPD (HCC)   Chronic hypoxemic respiratory failure (HCC)   Essential hypertension   Paroxysmal atrial fibrillation (H[facility])  Subjective:  Pain better.  Still tender in the RLQ.  She takes miralax and colace daily at home.   She has had 4 BM overnight.  Her husband has Alzheimer's disease and lives in [facility].  Objective:   Vitals:   02/09/18 0854 02/09/18 0859  BP:    Pulse:    Resp:    Temp:    SpO2: 98% 98%     Intake/Output from previous day:  07/20 0701 - 07/21 0700 In: 1724.2 [P.O.:180; I.V.:937.5; IV Piggyback:606.7] Out: -   Intake/Output this shift:  No intake/output data recorded.   Physical Exam:   General: Thin older WF who is alert and oriented.   She is on nasal O2   HEENT: Normal. Pupils equal. .   Lungs: Clear.   Abdomen: Midline scar.  Tender RLQ.   Lab Results:    Recent Labs    02/07/18 0449 02/08/18 0422  WBC 13.2* 9.5  HGB 10.7* 9.9*  HCT 33.5* 31.1*  PLT 356 312    BMET   Recent Labs    02/07/18 0449 02/08/18 0422  NA 139 141  K 3.9 4.0  CL 99 103  CO2 31 32  GLUCOSE 123* 99  BUN 9 12   CREATININE 0.76 0.72  CALCIUM 8.6* 8.7*    PT/INR  No results for input(s): LABPROT, INR in the last 72 hours.  ABG  No results for input(s): PHART, HCO3 in the last 72 hours.  Invalid input(s): PCO2, PO2   Studies/Results:  No results found.   Anti-infectives:   Anti-infectives (From admission, onward)   Start     Dose/Rate Route Frequency Ordered Stop   02/07/18 0600  ceFEPIme (MAXIPIME) 2 g in sodium chloride 0.9 % 100 mL IVPB  Status:  Discontinued     2 g 200 mL/hr over 30 Minutes Intravenous Every 12 hours 02/07/18 0106 02/07/18 0122   02/07/18 0600  ceFEPIme (MAXIPIME) 1 g in sodium chloride 0.9 % 100 mL IVPB     1 g 200 mL/hr over 30 Minutes Intravenous Every 12 hours 02/07/18 0122     02/07/18 0400  metroNIDAZOLE (FLAGYL) IVPB 500 mg  Status:  Discontinued     500 mg 100 mL/hr over 60 Minutes Intravenous Every 8 hours 02/07/18 0106 02/07/18 0122   02/07/18 0400  metroNIDAZOLE (FLAGYL) IVPB 500 mg     50" 0 mg  100 mL/hr over 60 Minutes Intravenous Every 8 hours 02/07/18 0122     02/07/18 0030  piperacillin-tazobactam (ZOSYN) IVPB 3.375 g     3.375 g 100 mL/hr over 30 Minutes Intravenous  Once 02/07/18 0017 02/07/18 0120   02/06/18 2245  piperacillin-tazobactam (ZOSYN) IVPB 3.375 g     3.375 g 100 mL/hr over 30 Minutes Intravenous  Once 02/06/18 2240 02/06/18 2332   02/06/18 1845  cefTRIAXone (ROCEPHIN) 1 g in sodium chloride 0.9 % 100 mL IVPB     1 g 200 mL/hr over 30 Minutes Intravenous  Once 02/06/18 1837 02/06/18 1924   02/06/18 1845  metroNIDAZOLE (FLAGYL) IVPB 500 mg     500 mg 100 mL/hr over 60 Minutes Intravenous  Once 02/06/18 1837 02/06/18 2032      Alphonsa Overall, MD, FACS Pager: [place] [facility] Office: [phone] 02/09/2018

## 2018-02-09 NOTE — Progress Notes (Signed)
PROGRESS NOTE                                                                                                                                                                                                             Patient Demographics:    Tamara Adams, is a 77 y.o. female, DOB - 24-Jun-1941, MVH:[id number]  Admit date - 02/06/2018   Admitting Physician Md Edison Pace, MD  Outpatient Primary MD for the patient is [name], MD  LOS - 3   Chief Complaint  Patient presents with  . Abdominal Pain  . Shortness of Breath       Brief Narrative     77 y.o. female WITH  History of severe COPD on 8 L oxygen, hypertension, CAD resents to the ER because of worsening abdominal pain.    Work-up significant for acute appendicitis , currently no operative management has been pursued given the has been advanced COPD, and her DNR/DNI status.   Subjective:    Tamara Adams today has, No headache, No chest pain, reports abdominal pain, no bowel movement yet  Assessment  & Plan :    Principal Problem:   Appendicitis Active Problems:   End stage COPD ([place])   Chronic hypoxemic respiratory failure (HCC)   Essential hypertension   Paroxysmal atrial fibrillation (HCC)   Acute appendicitis -Management per primary surgical team, but she is high risk for surgery, as well she is known to have advanced COPD, expected to be very difficult to extubate after surgery, she did not want trach . -So current management with antibiotics, she is afebrile, leukocytosis has normalized, appears to be improving, afebrile, leukocytosis has normalized today, as well abdominal pain has improved, but still significant, tolerating clear liquid diet.  Severe COPD with chronic respiratory failure on 8 L oxygen.  -Appears to be at baseline at this point, 10 you with nebs, and home dose prednisone, -Has been seen by PCCM.  Hypertension -Overall uncontrolled, but much improved after  adding Norvasc, continue with Cozaar and metoprolol.  History of CAD -She is on Plavix and metoprolol, currently Plavix on hold heme in case she will end up needing procedure/drain.  D/W nursing staff, out of bed to chair, will try to ambulate, will offer her Magic cups, continue with IV fluids .     Code Status : DNR,  confirmed by the patient  Family Communication  : None at bedside  Procedures  : none  DVT Prophylaxis  :  [place] lovenox  Lab Results  Component Value Date   PLT 312 02/08/2018    Antibiotics  :    Anti-infectives (From admission, onward)   Start     Dose/Rate Route Frequency Ordered Stop   02/07/18 0600  ceFEPIme (MAXIPIME) 2 g in sodium chloride 0.9 % 100 mL IVPB  Status:  Discontinued     2 g 200 mL/hr over 30 Minutes Intravenous Every 12 hours 02/07/18 0106 02/07/18 0122   02/07/18 0600  ceFEPIme (MAXIPIME) 1 g in sodium chloride 0.9 % 100 mL IVPB     1 g 200 mL/hr over 30 Minutes Intravenous Every 12 hours 02/07/18 0122     02/07/18 0400  metroNIDAZOLE (FLAGYL) IVPB 500 mg  Status:  Discontinued     500 mg 100 mL/hr over 60 Minutes Intravenous Every 8 hours 02/07/18 0106 02/07/18 0122   02/07/18 0400  metroNIDAZOLE (FLAGYL) IVPB 500 mg     500 mg 100 mL/hr over 60 Minutes Intravenous Every 8 hours 02/07/18 0122     02/07/18 0030  piperacillin-tazobactam (ZOSYN) IVPB 3.375 g     3.375 g 100 mL/hr over 30 Minutes Intravenous  Once 02/07/18 0017 02/07/18 0120   02/06/18 2245  piperacillin-tazobactam (ZOSYN) IVPB 3.375 g     3.375 g 100 mL/hr over 30 Minutes Intravenous  Once 02/06/18 2240 02/06/18 2332   02/06/18 1845  cefTRIAXone (ROCEPHIN) 1 g in sodium chloride 0.9 % 100 mL IVPB     1 g 200 mL/hr over 30 Minutes Intravenous  Once 02/06/18 1837 02/06/18 1924   02/06/18 1845  metroNIDAZOLE (FLAGYL) IVPB 500 mg     500 mg 100 mL/hr over 60 Minutes Intravenous  Once 02/06/18 1837 02/06/18 2032        Objective:   Vitals:   02/08/18 2032 02/09/18  0345 02/09/18 0854 02/09/18 0859  BP: (!) 153/70 (!) 188/86    Pulse: 79 89    Resp: 16 16    Temp: 98.4 F (36.9 C) 98.2 F (36.8 C)    TempSrc: Oral Oral    SpO2: 100% 99% 98% 98%  Weight:      Height:        Wt Readings from Last 3 Encounters:  02/07/18 48 kg (105 lb 14.4 oz)  01/27/18 45 kg (99 lb 3.2 oz)  01/13/18 43.5 kg (96 lb)     Intake/Output Summary (Last 24 hours) at 02/09/2018 1140 Last data filed at 02/09/2018 0600 Gross per 24 hour  Intake 1724.17 ml  Output -  Net 1724.17 ml     Physical Exam  Awake Alert, Oriented X 3, No new F.N deficits, Normal affect Symmetrical Chest wall movement, Good air movement bilaterally, CTAB RRR,No Gallops,Rubs or new Murmurs, No Parasternal Heave +ve B.Sounds, Abd Soft, has some right lower quadrant abdominal pain to palpation No Cyanosis, Clubbing or edema, No new Rash or bruise       Data Review:    CBC Recent Labs  Lab 02/06/18 1656 02/07/18 0449 02/08/18 0422  WBC 10.9* 13.2* 9.5  HGB 12.1 10.7* 9.9*  HCT 36.9 33.5* 31.1*  PLT 325 356 312  MCV 93.9 95.7 96.3  MCH 30.8 30.6 30.7  MCHC 32.8 31.9 31.8  RDW 12.3 13.2 13.1  LYMPHSABS 1.6  --   --   MONOABS 1.1*  --   --  EOSABS 1.2*  --   --   BASOSABS 0.1  --   --     Chemistries  Recent Labs  Lab 02/06/18 1656 02/07/18 0449 02/08/18 0422  NA 133* 139 141  K 4.3 3.9 4.0  CL 93* 99 103  CO2 29 31 32  GLUCOSE 101* 123* 99  BUN 8 9 12   CREATININE 0.73 0.76 0.72  CALCIUM 8.6* 8.6* 8.7*  AST 21  --   --   ALT 12  --   --   ALKPHOS 72  --   --   BILITOT 0.2*  --   --    ------------------------------------------------------------------------------------------------------------------ No results for input(s): CHOL, HDL, LDLCALC, TRIG, CHOLHDL, LDLDIRECT in the last 72 hours.  Lab Results  Component Value Date   HGBA1C 5.7 10/08/2016    ------------------------------------------------------------------------------------------------------------------ No results for input(s): TSH, T4TOTAL, T3FREE, THYROIDAB in the last 72 hours.  Invalid input(s): FREET3 ------------------------------------------------------------------------------------------------------------------ No results for input(s): VITAMINB12, FOLATE, FERRITIN, TIBC, IRON, RETICCTPCT in the last 72 hours.  Coagulation profile No results for input(s): INR, PROTIME in the last 168 hours.  No results for input(s): DDIMER in the last 72 hours.  Cardiac Enzymes No results for input(s): CKMB, TROPONINI, MYOGLOBIN in the last 168 hours.  Invalid input(s): CK ------------------------------------------------------------------------------------------------------------------    Component Value Date/Time   BNP 54.3 03/30/2016 1400    Inpatient Medications  Scheduled Meds: . amLODipine  10 mg Oral Daily  . docusate sodium  100 mg Oral Daily  . dronabinol  5 mg Oral BID  . enoxaparin (LOVENOX) injection  30 mg Subcutaneous Q24H  . famotidine  20 mg Oral Daily  . fluticasone furoate-vilanterol  1 puff Inhalation Daily  . guaiFENesin  600 mg Oral BID  . lactose free nutrition  237 mL Oral TID WC  . losartan  100 mg Oral Daily  . metoprolol tartrate  50 mg Oral BID  . multivitamin with minerals  1 tablet Oral Daily  . pantoprazole  80 mg Oral Daily  . polyethylene glycol  17 g Oral Daily  . predniSONE  10 mg Oral Q breakfast  . umeclidinium bromide  1 puff Inhalation Daily   Continuous Infusions: . sodium chloride 50 mL/hr at 02/09/18 0443  . ceFEPime (MAXIPIME) IV Stopped (02/09/18 0530)   And  . metronidazole Stopped (02/09/18 0400)   PRN Meds:.acetaminophen, albuterol, diphenhydrAMINE **OR** diphenhydrAMINE, HYDROcodone-acetaminophen, lidocaine, morphine injection, ondansetron **OR** ondansetron (ZOFRAN) IV, promethazine, traMADol  Micro Results No  results found for this or any previous visit (from the past 240 hour(s)).  Radiology Reports Ct Abdomen Pelvis W Contrast  Result Date: 02/06/2018 CLINICAL DATA:  Generalized abdominal pain for the past 3 days. EXAM: CT ABDOMEN AND PELVIS WITH CONTRAST TECHNIQUE: Multidetector CT imaging of the abdomen and pelvis was performed using the standard protocol following bolus administration of intravenous contrast. CONTRAST:  166mL ISOVUE-300 IOPAMIDOL (ISOVUE-300) INJECTION 61% COMPARISON:  PET-CT dated January 08, 2018. CT abdomen pelvis dated July 18, 2017. FINDINGS: Lower chest: No acute abnormality. Hepatobiliary: Small hypodense lesions in the left and right hepatic lobes measuring up to 1.1 cm are unchanged, likely small cysts. There is a 1.3 cm discontinuous arterially enhancing lesion in the inferior right hepatic lobe which demonstrates enhancement on delayed images, consistent with a hemangioma. Status post cholecystectomy. Unchanged mild central intrahepatic biliary dilatation. Pancreas: Unremarkable. Prominence of the main pancreatic duct is similar to prior studies. No surrounding inflammatory changes. Spleen: Normal in size without focal abnormality. Adrenals/Urinary Tract: Adrenal glands  are unremarkable. Kidneys are normal, without renal calculi, focal lesion, or hydronephrosis. Bladder is moderately distended. Stomach/Bowel: Appendix is fluid-filled and dilated, containing several appendicoliths, with mild periappendiceal inflammatory changes. Appendix: Location: Medial to the cecum coursing posteriorly. Diameter: 11 mm. Appendicolith: One at the tip and one at the base. Mucosal hyper-enhancement: Present. Extraluminal gas: None. Periappendiceal collection: None. Stomach is within normal limits. No evidence of bowel wall thickening, distention, or inflammatory changes. Left-sided colonic diverticulosis. Vascular/Lymphatic: Aortic atherosclerosis. No enlarged abdominal or pelvic lymph nodes.  Reproductive: Status post hysterectomy. No adnexal masses. Other: Small bilateral fat containing inguinal hernias. No free fluid or pneumoperitoneum. Musculoskeletal: No acute or significant osseous findings. Unchanged bilateral L5 pars defects. IMPRESSION: 1. Acute appendicitis.  No perforation or abscess. 2.  Aortic atherosclerosis (ICD10-I70.0). Electronically Signed   By: Titus Dubin M.D.   On: 02/06/2018 18:25   Dg Chest Port 1 View  Result Date: 02/06/2018 CLINICAL DATA:  Preop for appendicitis.  Dyspnea. EXAM: PORTABLE CHEST 1 VIEW COMPARISON:  07/24/2017 CXR, PET-CT 01/08/2018 FINDINGS: Ill-defined left apical opacity is noted that may represent stereotactic body radiation therapy change for known metabolically active left apical pulmonary nodule as seen on prior PET-CT. Emphysematous hyperinflation of the lungs without acute pulmonary consolidation or CHF. No effusion is identified though the right lateral costophrenic angle is minimally excluded. Heart size is within normal limits. Moderate aortic atherosclerosis is seen. IMPRESSION: Emphysematous hyperinflation of the lungs with post treatment change felt to account for the ill-defined opacity at the left lung apex post SBRT. No acute pulmonary consolidation suspicious for pneumonia. Electronically Signed   By: Ashley Royalty M.D.   On: 02/06/2018 23:12    Phillips Climes M.D on 02/09/2018 at 11:40 AM  Between 7am to 7pm - Pager - [phone]  After 7pm go to [email or web address] - password [facility]  [facility] -  Office  [phone]

## 2018-02-10 LAB — CBC WITH DIFFERENTIAL/PLATELET
BASOS ABS: 0.1 10*3/uL (ref 0.0–0.1)
Basophils Relative: 1 %
Eosinophils Absolute: 1.4 10*3/uL — ABNORMAL HIGH (ref 0.0–0.7)
Eosinophils Relative: 19 %
HEMATOCRIT: 30.4 % — AB (ref 36.0–46.0)
Hemoglobin: 10 g/dL — ABNORMAL LOW (ref 12.0–15.0)
LYMPHS PCT: 17 %
Lymphs Abs: 1.2 10*3/uL (ref 0.7–4.0)
MCH: 30 pg (ref 26.0–34.0)
MCHC: 32.9 g/dL (ref 30.0–36.0)
MCV: 91.3 fL (ref 78.0–100.0)
MONO ABS: 0.8 10*3/uL (ref 0.1–1.0)
MONOS PCT: 11 %
Neutro Abs: 3.7 10*3/uL (ref 1.7–7.7)
Neutrophils Relative %: 52 %
Platelets: 310 10*3/uL (ref 150–400)
RBC: 3.33 MIL/uL — ABNORMAL LOW (ref 3.87–5.11)
RDW: 12.6 % (ref 11.5–15.5)
WBC: 7.1 10*3/uL (ref 4.0–10.5)

## 2018-02-10 LAB — BASIC METABOLIC PANEL
ANION GAP: 8 (ref 5–15)
BUN: <5 mg/dL — ABNORMAL LOW (ref 8–23)
CALCIUM: 8.4 mg/dL — AB (ref 8.9–10.3)
CO2: 29 mmol/L (ref 22–32)
Chloride: 100 mmol/L (ref 98–111)
Creatinine, Ser: 0.5 mg/dL (ref 0.44–1.00)
GFR calc Af Amer: >60 mL/min (ref >60)
GFR calc non Af Amer: >60 mL/min (ref >60)
Glucose, Bld: 113 mg/dL — ABNORMAL HIGH (ref 70–99)
Potassium: 3.1 mmol/L — ABNORMAL LOW (ref 3.5–5.1)
Sodium: 137 mmol/L (ref 135–145)

## 2018-02-10 MED ORDER — HYDRALAZINE HCL 25 MG PO TABS: 25.0000 mg | ORAL_TABLET | Freq: Three times a day (TID) | ORAL | Status: DC

## 2018-02-10 MED ORDER — POTASSIUM CHLORIDE 20 MEQ PO PACK
40.0000 meq | PACK | ORAL | Status: AC
  Administered 2018-02-10 (×2): 40 meq via ORAL
  Filled 2018-02-10 (×2): qty 2

## 2018-02-10 MED ORDER — HYDRALAZINE HCL 50 MG PO TABS
50.0000 mg | ORAL_TABLET | Freq: Three times a day (TID) | ORAL | Status: DC
  Administered 2018-02-10 – 2018-02-11 (×4): 50 mg via ORAL
  Filled 2018-02-10 (×4): qty 1

## 2018-02-10 NOTE — Progress Notes (Signed)
[facility] Progress Note     Subjective: CC-  No new complaints. RLQ pain improving. Started on clear liquids yesterday and is tolerating well. Having bowel movements. No emesis. States that she does continue to have intermittent nausea, but this is her baseline for which she takes phenergan and zofran at home. Worked well with therapies yesterday, recommending no follow up.  Objective: Vital signs in last 24 hours: Temp:  [98 F (36.7 C)-98.5 F (36.9 C)] 98 F (36.7 C) (07/22 0513) Pulse Rate:  [72-83] 72 (07/22 0513) Resp:  [18-20] 20 (07/22 0513) BP: (166-192)/(75-94) 187/75 (07/22 0830) SpO2:  [98 %-100 %] 100 % (07/22 0830) Last BM Date: 02/09/18  Intake/Output from previous day: 07/21 0701 - 07/22 0700 In: 2103.3 [P.O.:360; I.V.:1200; IV Piggyback:543.3] Out: -  Intake/Output this shift: No intake/output data recorded.  PE: Gen:  Alert, NAD, pleasant HEENT: EOM's intact, pupils equal and round Card:  RRR Pulm:  CTAB, effort normal on 8L O2 [place] Abd: well healed midline incision, soft, nondistended, +BS, mild TTP RLQ with guarding/no rebound tenderness Ext:  Calves soft and nontender Psych: A&Ox3  Skin: no rashes noted, warm and dry  Lab Results:  Recent Labs    02/08/18 0422 02/10/18 0442  WBC 9.5 7.1  HGB 9.9* 10.0*  HCT 31.1* 30.4*  PLT 312 310   BMET Recent Labs    02/08/18 0422 02/10/18 0442  NA 141 137  K 4.0 3.1*  CL 103 100  CO2 32 29  GLUCOSE 99 113*  BUN 12 <5*  CREATININE 0.72 0.50  CALCIUM 8.7* 8.4*   PT/INR No results for input(s): LABPROT, INR in the last 72 hours. CMP     Component Value Date/Time   NA 137 02/10/2018 0442   K 3.1 (L) 02/10/2018 0442   CL 100 02/10/2018 0442   CO2 29 02/10/2018 0442   GLUCOSE 113 (H) 02/10/2018 0442   BUN <5 (L) 02/10/2018 0442   CREATININE 0.50 02/10/2018 0442   CREATININE 0.80 01/13/2018 1335   CREATININE 0.72 08/05/2017 1440   CALCIUM 8.4 (L) 02/10/2018 0442   PROT 6.8  02/06/2018 1656   ALBUMIN 3.6 02/06/2018 1656   AST 21 02/06/2018 1656   AST 21 01/13/2018 1335   ALT 12 02/06/2018 1656   ALT 10 01/13/2018 1335   ALKPHOS 72 02/06/2018 1656   BILITOT 0.2 (L) 02/06/2018 1656   BILITOT 0.2 01/13/2018 1335   GFRNONAA >60 02/10/2018 0442   GFRNONAA >60 01/13/2018 1335   GFRNONAA 81 08/05/2017 1440   GFRAA >60 02/10/2018 0442   GFRAA >60 01/13/2018 1335   GFRAA 94 08/05/2017 1440   Lipase     Component Value Date/Time   LIPASE 33 02/06/2018 1656       Studies/Results: No results found.  Anti-infectives: Anti-infectives (From admission, onward)   Start     Dose/Rate Route Frequency Ordered Stop   02/07/18 0600  ceFEPIme (MAXIPIME) 2 g in sodium chloride 0.9 % 100 mL IVPB  Status:  Discontinued     2 g 200 mL/hr over 30 Minutes Intravenous Every 12 hours 02/07/18 0106 02/07/18 0122   02/07/18 0600  ceFEPIme (MAXIPIME) 1 g in sodium chloride 0.9 % 100 mL IVPB     1 g 200 mL/hr over 30 Minutes Intravenous Every 12 hours 02/07/18 0122     02/07/18 0400  metroNIDAZOLE (FLAGYL) IVPB 500 mg  Status:  Discontinued     500 mg 100 mL/hr over 60 Minutes Intravenous Every 8  hours 02/07/18 0106 02/07/18 0122   02/07/18 0400  metroNIDAZOLE (FLAGYL) IVPB 500 mg     500 mg 100 mL/hr over 60 Minutes Intravenous Every 8 hours 02/07/18 0122     02/07/18 0030  piperacillin-tazobactam (ZOSYN) IVPB 3.375 g     3.375 g 100 mL/hr over 30 Minutes Intravenous  Once 02/07/18 0017 02/07/18 0120   02/06/18 2245  piperacillin-tazobactam (ZOSYN) IVPB 3.375 g     3.375 g 100 mL/hr over 30 Minutes Intravenous  Once 02/06/18 2240 02/06/18 2332   02/06/18 1845  cefTRIAXone (ROCEPHIN) 1 g in sodium chloride 0.9 % 100 mL IVPB     1 g 200 mL/hr over 30 Minutes Intravenous  Once 02/06/18 1837 02/06/18 1924   02/06/18 1845  metroNIDAZOLE (FLAGYL) IVPB 500 mg     500 mg 100 mL/hr over 60 Minutes Intravenous  Once 02/06/18 1837 02/06/18 2032        Assessment/Plan Severe COPD - on 8L O2 at home, 10mg  prednisone daily, continue home meds H/o hypermetabolic 8.5UD pulm nodule s/p SBRT x6 fractions CAD h/o STEMI 07/2017 treated with medical management, on Plavix (last dose 7/18) HTN - appreciate [facility] assistance, on cozaar, norvasc, and metoprolol. Hydralazine added today GERD - protonix H/o PAF - not on anticoagulation, currently in NSR Prior h/o DVT  Code status DNR/DNI  Acute appendicitis without abscess or perforation - 4 days of abdominal pain and nausea prior to admission - CT scan 7/18 showed acute appendicitis with dilated appendix, several appendicoliths and mild periappendiceal inflammatory changes - patient is very high operative risk from pulmonary standpoint - she expressed that she does not want any procedure that would require intubation due to elevated risk of ventilator dependence for the remainder of her life - treating nonop with abx  ID - maxipime 7/19>>, flagyl 7/19>>, zosyn x1 7/19, rocephin x1 7/18 FEN - IVF, Boost, FLD VTE - SCDs, lovenox Foley - none  Plan - Advance to full liquid diet. Continue therapies/OOB. Today is day #4 of IV abx and her WBC is WNL and she is afebrile; will discuss when to switch to oral abx with MD.   LOS: 4 days    Wellington [place] , [facility] 02/10/2018, 8:54 AM Pager: [phone] Consults: [phone] Mon 7:00 am -11:30 AM Tues-Fri 7:00 am-4:30 pm Sat-Sun 7:00 am-11:30 am

## 2018-02-10 NOTE — Progress Notes (Signed)
Physical Therapy Treatment Patient Details Name: Tamara Adams MRN: [id number] DOB: 09/28/1940 Today's Date: 02/10/2018    History of Present Illness 02/06/2018 from home with acute on chronic abdominal pain for 3-4 days prior to admission.  Patient has a past medical history significant for arthritis, COPD (home oxygen use 8 L nasal cannula), hypertension, GERD, hyperlipidemia, MI (06/2017), IDA, tobacco use, left lung cancer s/p radiation, and small bowel obstruction. On presentation to ER patient complained of right lower quadrant pain with nausea. CT abdomen showed acute appendicitis. She was seen by Pulmonary critical care and was deemed to be a very high risk for surgery and the plan was made to manager patient conservatively with antibiotics    PT Comments    The patient complained of nausea  Since awakening but was willing to ambulate. Patient ambulated x 140' today. contiue PT.    Follow Up Recommendations  No PT follow up     Equipment Recommendations  None recommended by PT    Recommendations for Other Services       Precautions / Restrictions Precautions Precaution Comments: no falls in past 1 year, on 8L home O2    Mobility  Bed Mobility Overal bed mobility: Modified Independent             General bed mobility comments: HOB up,   Transfers Overall transfer level: Needs assistance Equipment used: Rolling walker (2 wheeled) Transfers: Sit to/from Stand Sit to Stand: Supervision         General transfer comment: no physical assist, no loss of balance  Ambulation/Gait Ambulation/Gait assistance: Supervision Gait Distance (Feet): 140 Feet Assistive device: Rolling walker (2 wheeled) Gait Pattern/deviations: Step-through pattern     General Gait Details: SaO2 100% on 8L O2 HFNC, no loss of balance, improved distance   Stairs             Wheelchair Mobility    Modified Rankin (Stroke Patients Only)       Balance                                             Cognition Arousal/Alertness: Awake/alert                                            Exercises      General Comments        Pertinent Vitals/Pain Pain Assessment: No/denies pain    Home Living                      Prior Function            PT Goals (current goals can now be found in the care plan section) Progress towards PT goals: Progressing toward goals    Frequency    Min 3X/week      PT Plan Current plan remains appropriate    Co-evaluation              AM-PAC PT "6 Clicks" Daily Activity  Outcome Measure  Difficulty turning over in bed (including adjusting bedclothes, sheets and blankets)?: None Difficulty moving from lying on back to sitting on the side of the bed? : None Difficulty sitting down on and standing up from a chair with arms (e.g., wheelchair, bedside commode,  etc,.)?: A Little Help needed moving to and from a bed to chair (including a wheelchair)?: A Little Help needed walking in hospital room?: A Little Help needed climbing 3-5 steps with a railing? : A Little 6 Click Score: 20    End of Session Equipment Utilized During Treatment: Gait belt;Oxygen Activity Tolerance: Patient tolerated treatment well Patient left: in bed;with call bell/phone within reach;with bed alarm set Nurse Communication: Mobility status PT Visit Diagnosis: Difficulty in walking, not elsewhere classified (R26.2)     Time: 8242-3536 PT Time Calculation (min) (ACUTE ONLY): 27 min  Charges:  $Gait Training: 23-37 mins                    G CodesTresa Adams PT [phone]    Tamara Adams 02/10/2018, 2:41 PM

## 2018-02-10 NOTE — Progress Notes (Signed)
Chaplain providing support around advance directives / decisions for EOL.  Tamara Adams relates that she does not wish to change her HCPOA (son).  However, she worries that it will be difficult for them to carry out her wishes at end of life.  We spoke about her values and expectations and reviewed living will.  She wishes to go over this paperwork tonight and chaplain will return tomorrow to notarize.     [facility] / [facility] Chaplain Jerene Pitch, MDiv, [facility]

## 2018-02-10 NOTE — Progress Notes (Signed)
PROGRESS NOTE                                                                                                                                                                                                             Patient Demographics:    Tamara Adams, is a 77 y.o. female, DOB - 20-Dec-1940, HTD:[id number]  Admit date - 02/06/2018   Admitting Physician Md Edison Pace, MD  Outpatient Primary MD for the patient is Tamara Marry, MD  LOS - 4   Chief Complaint  Patient presents with  . Abdominal Pain  . Shortness of Breath       Brief Narrative     77 y.o. female WITH  History of severe COPD on 8 L oxygen, hypertension, CAD resents to the ER because of worsening abdominal pain.    Work-up significant for acute appendicitis , currently no operative management has been pursued given the has been advanced COPD, and her DNR/DNI status.   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 -So current management with antibiotics, she is afebrile, leukocytosis has normalized, appears to be improving, afebrile, leukocytosis has normalized today, as well abdominal pain has improved, but still significant, tolerating clear liquid diet.  Severe COPD with chronic respiratory failure on 8 L oxygen.  -Appears to be at baseline at this point, 10 you with nebs, and home dose prednisone, -Has been seen by PCCM.  Hypertension -Overall  uncontrolled, is on home dose Cozaar and metoprolol, remains elevated despite starting on Norvasc yesterday, she was started on hydralazine today , continue with PRN hydralazine as well .  History of CAD -She is on Plavix and metoprolol, currently Plavix on hold heme in case she will end up needing procedure/drain.  Bed to chair, continues incentive spirometry, and to ambulate .  DC IV fluids today     Code Status : DNR, confirmed by the patient  Family Communication  : None at bedside  Procedures  : none  DVT Prophylaxis  :  [place] lovenox  Lab Results  Component Value Date   PLT 310 02/10/2018    Antibiotics  :    Anti-infectives (From admission, onward)   Start     Dose/Rate Route Frequency Ordered Stop   02/07/18 0600  ceFEPIme (MAXIPIME) 2 g in sodium chloride 0.9 % 100 mL IVPB  Status:  Discontinued     2 g 200 mL/hr over 30 Minutes Intravenous Every 12 hours 02/07/18 0106 02/07/18 0122   02/07/18 0600  ceFEPIme (MAXIPIME) 1 g in sodium chloride 0.9 % 100 mL IVPB     1 g 200 mL/hr over 30 Minutes Intravenous Every 12 hours 02/07/18 0122     02/07/18 0400  metroNIDAZOLE (FLAGYL) IVPB 500 mg  Status:  Discontinued     500 mg 100 mL/hr over 60 Minutes Intravenous Every 8 hours 02/07/18 0106 02/07/18 0122   02/07/18 0400  metroNIDAZOLE (FLAGYL) IVPB 500 mg     500 mg 100 mL/hr over 60 Minutes Intravenous Every 8 hours 02/07/18 0122     02/07/18 0030  piperacillin-tazobactam (ZOSYN) IVPB 3.375 g     3.375 g 100 mL/hr over 30 Minutes Intravenous  Once 02/07/18 0017 02/07/18 0120   02/06/18 2245  piperacillin-tazobactam (ZOSYN) IVPB 3.375 g     3.375 g 100 mL/hr over 30 Minutes Intravenous  Once 02/06/18 2240 02/06/18 2332   02/06/18 1845  cefTRIAXone (ROCEPHIN) 1 g in sodium chloride 0.9 % 100 mL IVPB     1 g 200 mL/hr over 30 Minutes Intravenous  Once 02/06/18 1837 02/06/18 1924   02/06/18 1845  metroNIDAZOLE (FLAGYL) IVPB 500 mg     500 mg 100 mL/hr over 60 Minutes  Intravenous  Once 02/06/18 1837 02/06/18 2032        Objective:   Vitals:   02/09/18 1350 02/09/18 2028 02/10/18 0513 02/10/18 0830  BP: (!) 176/79 (!) 166/75 (!) 192/94 (!) 187/75  Pulse: 83 81 72   Resp: 20 18 20    Temp: 98.5 F (36.9 C) 98.4 F (36.9 C) 98 F (36.7 C)   TempSrc: Oral Oral Oral   SpO2: 99% 100% 100% 100%  Weight:      Height:        Wt Readings from Last 3 Encounters:  02/07/18 48 kg (105 lb 14.4 oz)  01/27/18 45 kg (99 lb 3.2 oz)  01/13/18 43.5 kg (96 lb)     Intake/Output Summary (Last 24 hours) at 02/10/2018 1347 Last data filed at 02/10/2018 1215 Gross per 24 hour  Intake 2343.34 ml  Output -  Net 2343.34 ml     Physical Exam  Awake Alert, Oriented X 3, No new F.N deficits, Normal affect Symmetrical Chest wall movement, Good air movement bilaterally, CTAB RRR,No Gallops,Rubs or new Murmurs, No Parasternal Heave +ve B.Sounds, Abd Soft, tenderness to palpation significantly improved No Cyanosis, Clubbing or edema, No new Rash or bruise        Data Review:    CBC Recent Labs  Lab 02/06/18 1656 02/07/18 0449 02/08/18 0422 02/10/18 0442  WBC 10.9* 13.2* 9.5 7.1  HGB 12.1 10.7* 9.9* 10.0*  HCT 36.9 33.5* 31.1* 30.4*  PLT 325 356 312 310  MCV 93.9 95.7 96.3 91.3  MCH 30.8 30.6 30.7 30.0  MCHC 32.8 31.9 31.8 32.9  RDW 12.3 13.2 13.1 12.6  LYMPHSABS 1.6  --   --  1.2  MONOABS 1.1*  --   --  0.8  EOSABS 1.2*  --   --  1.4*  BASOSABS 0.1  --   --  0.1    Chemistries  Recent Labs  Lab 02/06/18 1656 02/07/18 0449 02/08/18 0422 02/10/18 0442  NA 133* 139 141 137  K 4.3 3.9 4.0 3.1*  CL 93* 99 103 100  CO2 29 31 32 29  GLUCOSE 101* 123* 99 113*  BUN 8 9 12  <5*  CREATININE 0.73 0.76 0.72 0.50  CALCIUM 8.6* 8.6* 8.7* 8.4*  AST 21  --   --   --   ALT 12  --   --   --   ALKPHOS 72  --   --   --   BILITOT 0.2*  --   --   --     ------------------------------------------------------------------------------------------------------------------ No results for input(s): CHOL, HDL, LDLCALC, TRIG, CHOLHDL, LDLDIRECT in the last 72 hours.  Lab Results  Component Value Date   HGBA1C 5.7 10/08/2016   ------------------------------------------------------------------------------------------------------------------ No results for input(s): TSH, T4TOTAL, T3FREE, THYROIDAB in the last 72 hours.  Invalid input(s): FREET3 ------------------------------------------------------------------------------------------------------------------ No results for input(s): VITAMINB12, FOLATE, FERRITIN, TIBC, IRON, RETICCTPCT in the last 72 hours.  Coagulation profile No results for input(s): INR, PROTIME in the last 168 hours.  No results for input(s): DDIMER in the last 72 hours.  Cardiac Enzymes No results for input(s): CKMB, TROPONINI, MYOGLOBIN in the last 168 hours.  Invalid input(s): CK ------------------------------------------------------------------------------------------------------------------    Component Value Date/Time   BNP 54.3 03/30/2016 1400    Inpatient Medications  Scheduled Meds: . amLODipine  10 mg Oral Daily  . docusate sodium  100 mg Oral Daily  . dronabinol  5 mg Oral BID  . enoxaparin (LOVENOX) injection  30 mg Subcutaneous Q24H  . famotidine  20 mg Oral Daily  . fluticasone furoate-vilanterol  1 puff Inhalation Daily  . guaiFENesin  600 mg Oral BID  . hydrALAZINE  50 mg Oral Q8H  . lactose free nutrition  237 mL Oral TID WC  . losartan  100 mg Oral Daily  . metoprolol tartrate  50 mg Oral BID  . multivitamin with minerals  1 tablet Oral Daily  . pantoprazole  80 mg Oral Daily  . polyethylene glycol  17 g Oral Daily  . predniSONE  10 mg Oral Q breakfast  . umeclidinium bromide  1 puff Inhalation Daily   Continuous Infusions: . sodium chloride 50 mL/hr at 02/10/18 0700  . ceFEPime  (MAXIPIME) IV Stopped (02/10/18 0540)   And  . metronidazole 500 mg (02/10/18 1220)   PRN Meds:.acetaminophen, albuterol, diphenhydrAMINE **OR** diphenhydrAMINE, HYDROcodone-acetaminophen, lidocaine, morphine injection, ondansetron **OR** ondansetron (ZOFRAN) IV, promethazine, traMADol  Micro Results No results found for this or any previous visit (from the past 240 hour(s)).  Radiology Reports Ct Abdomen Pelvis W Contrast  Result Date: 02/06/2018 CLINICAL DATA:  Generalized abdominal pain for the past 3 days. EXAM: CT ABDOMEN AND PELVIS WITH CONTRAST TECHNIQUE: Multidetector CT imaging of the abdomen and pelvis was performed using the standard protocol following bolus administration of intravenous contrast. CONTRAST:  154mL ISOVUE-300 IOPAMIDOL (ISOVUE-300) INJECTION 61% COMPARISON:  PET-CT dated January 08, 2018. CT abdomen pelvis dated July 18, 2017. FINDINGS: Lower chest: No acute abnormality. Hepatobiliary: Small hypodense lesions in the left and right hepatic lobes measuring up to 1.1 cm are unchanged, likely small cysts. There is a  1.3 cm discontinuous arterially enhancing lesion in the inferior right hepatic lobe which demonstrates enhancement on delayed images, consistent with a hemangioma. Status post cholecystectomy. Unchanged mild central intrahepatic biliary dilatation. Pancreas: Unremarkable. Prominence of the main pancreatic duct is similar to prior studies. No surrounding inflammatory changes. Spleen: Normal in size without focal abnormality. Adrenals/Urinary Tract: Adrenal glands are unremarkable. Kidneys are normal, without renal calculi, focal lesion, or hydronephrosis. Bladder is moderately distended. Stomach/Bowel: Appendix is fluid-filled and dilated, containing several appendicoliths, with mild periappendiceal inflammatory changes. Appendix: Location: Medial to the cecum coursing posteriorly. Diameter: 11 mm. Appendicolith: One at the tip and one at the base. Mucosal  hyper-enhancement: Present. Extraluminal gas: None. Periappendiceal collection: None. Stomach is within normal limits. No evidence of bowel wall thickening, distention, or inflammatory changes. Left-sided colonic diverticulosis. Vascular/Lymphatic: Aortic atherosclerosis. No enlarged abdominal or pelvic lymph nodes. Reproductive: Status post hysterectomy. No adnexal masses. Other: Small bilateral fat containing inguinal hernias. No free fluid or pneumoperitoneum. Musculoskeletal: No acute or significant osseous findings. Unchanged bilateral L5 pars defects. IMPRESSION: 1. Acute appendicitis.  No perforation or abscess. 2.  Aortic atherosclerosis (ICD10-I70.0). Electronically Signed   By: Titus Dubin M.D.   On: 02/06/2018 18:25   Dg Chest Port 1 View  Result Date: 02/06/2018 CLINICAL DATA:  Preop for appendicitis.  Dyspnea. EXAM: PORTABLE CHEST 1 VIEW COMPARISON:  07/24/2017 CXR, PET-CT 01/08/2018 FINDINGS: Ill-defined left apical opacity is noted that may represent stereotactic body radiation therapy change for known metabolically active left apical pulmonary nodule as seen on prior PET-CT. Emphysematous hyperinflation of the lungs without acute pulmonary consolidation or CHF. No effusion is identified though the right lateral costophrenic angle is minimally excluded. Heart size is within normal limits. Moderate aortic atherosclerosis is seen. IMPRESSION: Emphysematous hyperinflation of the lungs with post treatment change felt to account for the ill-defined opacity at the left lung apex post SBRT. No acute pulmonary consolidation suspicious for pneumonia. Electronically Signed   By: Ashley Royalty M.D.   On: 02/06/2018 23:12    Phillips Climes M.D on 02/10/2018 at 1:47 PM  Between 7am to 7pm - Pager - [phone]  After 7pm go to [email or web address] - password [facility]  [facility] -  Office  [phone]

## 2018-02-10 NOTE — Care Management Important Message (Signed)
Important Message  Patient Details  Name: Tamara Adams MRN: [id number] Date of Birth: 1940-10-25   Medicare Important Message Given:  Yes    Kerin Salen 02/10/2018, 12:04 [place] Message  Patient Details  Name: Tamara Adams MRN: [id number] Date of Birth: [birth year]   Medicare Important Message Given:  Yes    Kerin Salen 02/10/2018, 12:04 PM

## 2018-02-11 ENCOUNTER — Ambulatory Visit: Payer: Medicare Other | Admitting: Family Medicine

## 2018-02-11 DIAGNOSIS — I480 Paroxysmal atrial fibrillation: Secondary | ICD-10-CM

## 2018-02-11 LAB — URINALYSIS, ROUTINE W REFLEX MICROSCOPIC
BILIRUBIN URINE: NEGATIVE
Glucose, UA: NEGATIVE mg/dL
KETONES UR: NEGATIVE mg/dL
NITRITE: NEGATIVE
Protein, ur: 30 mg/dL — AB
Specific Gravity, Urine: 1.01 (ref 1.005–1.030)
pH: 7 (ref 5.0–8.0)

## 2018-02-11 LAB — BASIC METABOLIC PANEL
ANION GAP: 9 (ref 5–15)
BUN: <5 mg/dL — ABNORMAL LOW (ref 8–23)
CALCIUM: 9 mg/dL (ref 8.9–10.3)
CHLORIDE: 98 mmol/L (ref 98–111)
CO2: 31 mmol/L (ref 22–32)
Creatinine, Ser: 0.61 mg/dL (ref 0.44–1.00)
GFR calc Af Amer: >60 mL/min (ref >60)
GFR calc non Af Amer: >60 mL/min (ref >60)
GLUCOSE: 97 mg/dL (ref 70–99)
POTASSIUM: 3.5 mmol/L (ref 3.5–5.1)
Sodium: 138 mmol/L (ref 135–145)

## 2018-02-11 LAB — CBC
HEMATOCRIT: 34.2 % — AB (ref 36.0–46.0)
Hemoglobin: 11.1 g/dL — ABNORMAL LOW (ref 12.0–15.0)
MCH: 30.3 pg (ref 26.0–34.0)
MCHC: 32.5 g/dL (ref 30.0–36.0)
MCV: 93.4 fL (ref 78.0–100.0)
Platelets: 383 10*3/uL (ref 150–400)
RBC: 3.66 MIL/uL — AB (ref 3.87–5.11)
RDW: 12.8 % (ref 11.5–15.5)
WBC: 6.8 10*3/uL (ref 4.0–10.5)

## 2018-02-11 MED ORDER — POTASSIUM CHLORIDE CRYS ER 20 MEQ PO TBCR
40.0000 meq | EXTENDED_RELEASE_TABLET | Freq: Once | ORAL | Status: AC
  Administered 2018-02-11: 40 meq via ORAL
  Filled 2018-02-11: qty 2

## 2018-02-11 MED ORDER — HYDRALAZINE HCL 50 MG PO TABS
75.0000 mg | ORAL_TABLET | Freq: Three times a day (TID) | ORAL | Status: DC
  Administered 2018-02-11 – 2018-02-12 (×3): 75 mg via ORAL
  Filled 2018-02-11 (×3): qty 1

## 2018-02-11 NOTE — Progress Notes (Signed)
   02/11/18 1600  Advance Directives (For Healthcare)  Does Patient Have a Medical Advance Directive? Yes  Does patient want to make changes to medical advance directive? Yes (Inpatient - patient requests chaplain consult to change a medical advance directive)  Type of Advance Directive Living will  Copy of Living Will in Chart? Yes    Assisted in completing Living will.  Copy in chart.  Pt with original and copies.

## 2018-02-11 NOTE — Progress Notes (Signed)
PROGRESS NOTE                                                                                                                                                                                                             Patient Demographics:    Tamara Adams, is a 77 y.o. female, DOB - 08/06/1940, AST:[id number]  Admit date - 02/06/2018   Admitting Physician Md Edison Pace, MD  Outpatient Primary MD for the patient is [name], MD  LOS - 5   Chief Complaint  Patient presents with  . Abdominal Pain  . Shortness of Breath       Brief Narrative     77 y.o. female WITH  History of severe COPD on 8 L oxygen, hypertension, CAD resents to the ER because of worsening abdominal pain.    Work-up significant for acute appendicitis , currently no operative management has been pursued given the has been advanced COPD, and her DNR/DNI status.   Subjective:    Tamara Adams today has, No headache, No chest pain, domino pain significantly improved, she does report some lower back pain.   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 -So current management with antibiotics, she is afebrile, leukocytosis has normalized, appears to be improving, afebrile, leukocytosis has normalized today, as well abdominal pain has improved, but still significant, tolerating clear liquid diet.  Diet is being advanced by surgery  Severe COPD with chronic respiratory failure on 8 L oxygen.  -Appears to be at baseline at this point, 10 you with nebs, and home dose prednisone, -Has been seen by  PCCM.  Hypertension -Overall uncontrolled, is on home dose Cozaar and metoprolol, remains elevated despite adding Norvasc, and hydralazine, I will increase hydralazine from 50 3 times daily to 75 mg 3 times daily . -10 you with PRN IV hydralazine  History of CAD -She is on Plavix and metoprolol, currently Plavix on hold heme in case  she will end up needing procedure/drain.  Patient had abnormal analysis obtained by general surgery, follow urine cultures, she denies dysuria or polyuria.     Code Status : DNR, confirmed by the patient  Family Communication  : None at bedside  Procedures  : none  DVT Prophylaxis  :  [place] lovenox  Lab Results  Component Value Date   PLT 383 02/11/2018    Antibiotics  :    Anti-infectives (From admission, onward)   Start     Dose/Rate Route Frequency Ordered Stop   02/07/18 0600  ceFEPIme (MAXIPIME) 2 g in sodium chloride 0.9 % 100 mL IVPB  Status:  Discontinued     2 g 200 mL/hr over 30 Minutes Intravenous Every 12 hours 02/07/18 0106 02/07/18 0122   02/07/18 0600  ceFEPIme (MAXIPIME) 1 g in sodium chloride 0.9 % 100 mL IVPB     1 g 200 mL/hr over 30 Minutes Intravenous Every 12 hours 02/07/18 0122     02/07/18 0400  metroNIDAZOLE (FLAGYL) IVPB 500 mg  Status:  Discontinued     500 mg 100 mL/hr over 60 Minutes Intravenous Every 8 hours 02/07/18 0106 02/07/18 0122   02/07/18 0400  metroNIDAZOLE (FLAGYL) IVPB 500 mg     500 mg 100 mL/hr over 60 Minutes Intravenous Every 8 hours 02/07/18 0122     02/07/18 0030  piperacillin-tazobactam (ZOSYN) IVPB 3.375 g     3.375 g 100 mL/hr over 30 Minutes Intravenous  Once 02/07/18 0017 02/07/18 0120   02/06/18 2245  piperacillin-tazobactam (ZOSYN) IVPB 3.375 g     3.375 g 100 mL/hr over 30 Minutes Intravenous  Once 02/06/18 2240 02/06/18 2332   02/06/18 1845  cefTRIAXone (ROCEPHIN) 1 g in sodium chloride 0.9 % 100 mL IVPB     1 g 200 mL/hr over 30 Minutes Intravenous  Once 02/06/18 1837 02/06/18 1924    02/06/18 1845  metroNIDAZOLE (FLAGYL) IVPB 500 mg     500 mg 100 mL/hr over 60 Minutes Intravenous  Once 02/06/18 1837 02/06/18 2032        Objective:   Vitals:   02/10/18 2016 02/11/18 0650 02/11/18 0720 02/11/18 1303  BP: (!) 159/68 (!) 170/80 (!) 165/72 (!) 149/66  Pulse: 85 81 82 85  Resp: 20 (!) 22  16  Temp: 98.7 F (37.1 C) 98.8 F (37.1 C)  98.1 F (36.7 C)  TempSrc: Oral Oral  Oral  SpO2: 100% 100%  100%  Weight:      Height:        Wt Readings from Last 3 Encounters:  02/07/18 48 kg (105 lb 14.4 oz)  01/27/18 45 kg (99 lb 3.2 oz)  01/13/18 43.5 kg (96 lb)     Intake/Output Summary (Last 24 hours) at 02/11/2018 1318 Last data filed at 02/11/2018 1300 Gross per 24 hour  Intake 460 ml  Output -  Net 460 ml     Physical Exam  Awake Alert, Oriented X 3, No new F.N deficits, Normal affect Symmetrical Chest wall movement, Good air movement bilaterally, CTAB RRR,No Gallops,Rubs or new Murmurs, No Parasternal Heave +ve B.Sounds, Abd Soft,  right lower abdomen tenderness is improving, No rebound - guarding or rigidity. No Cyanosis, Clubbing or edema, No new Rash or bruise      Data Review:    CBC Recent Labs  Lab 02/06/18 1656 02/07/18 0449 02/08/18 0422 02/10/18 0442 02/11/18 0420  WBC 10.9* 13.2* 9.5 7.1 6.8  HGB 12.1 10.7* 9.9* 10.0* 11.1*  HCT 36.9 33.5* 31.1* 30.4* 34.2*  PLT 325 356 312 310 383  MCV 93.9 95.7 96.3 91.3 93.4  MCH 30.8 30.6 30.7 30.0 30.3  MCHC 32.8 31.9 31.8 32.9 32.5  RDW 12.3 13.2 13.1 12.6 12.8  LYMPHSABS 1.6  --   --  1.2  --   MONOABS 1.1*  --   --  0.8  --   EOSABS 1.2*  --   --  1.4*  --   BASOSABS 0.1  --   --  0.1  --     Chemistries  Recent Labs  Lab 02/06/18 1656 02/07/18 0449 02/08/18 0422 02/10/18 0442 02/11/18 0420  NA 133* 139 141 137 138  K 4.3 3.9 4.0 3.1* 3.5  CL 93* 99 103 100 98  CO2 29 31 32 29 31  GLUCOSE 101* 123* 99 113* 97  BUN 8 9 12  <5* <5*  CREATININE 0.73 0.76 0.72 0.50 0.61    CALCIUM 8.6* 8.6* 8.7* 8.4* 9.0  AST 21  --   --   --   --   ALT 12  --   --   --   --   ALKPHOS 72  --   --   --   --   BILITOT 0.2*  --   --   --   --    ------------------------------------------------------------------------------------------------------------------ No results for input(s): CHOL, HDL, LDLCALC, TRIG, CHOLHDL, LDLDIRECT in the last 72 hours.  Lab Results  Component Value Date   HGBA1C 5.7 10/08/2016   ------------------------------------------------------------------------------------------------------------------ No results for input(s): TSH, T4TOTAL, T3FREE, THYROIDAB in the last 72 hours.  Invalid input(s): FREET3 ------------------------------------------------------------------------------------------------------------------ No results for input(s): VITAMINB12, FOLATE, FERRITIN, TIBC, IRON, RETICCTPCT in the last 72 hours.  Coagulation profile No results for input(s): INR, PROTIME in the last 168 hours.  No results for input(s): DDIMER in the last 72 hours.  Cardiac Enzymes No results for input(s): CKMB, TROPONINI, MYOGLOBIN in the last 168 hours.  Invalid input(s): CK ------------------------------------------------------------------------------------------------------------------    Component Value Date/Time   BNP 54.3 03/30/2016 1400    Inpatient Medications  Scheduled Meds: . amLODipine  10 mg Oral Daily  . docusate sodium  100 mg Oral Daily  . dronabinol  5 mg Oral BID  . enoxaparin (LOVENOX) injection  30 mg Subcutaneous Q24H  . famotidine  20 mg Oral Daily  . fluticasone furoate-vilanterol  1 puff Inhalation Daily  . guaiFENesin  600 mg Oral BID  . hydrALAZINE  75 mg Oral Q8H  . lactose free nutrition  237 mL Oral TID WC  . losartan  100 mg Oral Daily  . metoprolol tartrate  50 mg Oral BID  . multivitamin with minerals  1 tablet Oral Daily  . pantoprazole  80 mg Oral Daily  . predniSONE  10 mg Oral Q breakfast  . umeclidinium  bromide  1 puff Inhalation Daily   Continuous Infusions: . ceFEPime (MAXIPIME) IV Stopped (02/11/18 1308)   And  . metronidazole 500 mg (02/11/18 1147)   PRN Meds:.acetaminophen, albuterol, diphenhydrAMINE **OR** diphenhydrAMINE, HYDROcodone-acetaminophen, lidocaine, morphine injection, ondansetron **OR** ondansetron (ZOFRAN) IV, promethazine, traMADol  Micro Results No results found for this or any previous visit (from the past 240 hour(s)).  Radiology Reports Ct Abdomen Pelvis W Contrast  Result Date: 02/06/2018 CLINICAL DATA:  Generalized abdominal pain for the past 3 days. EXAM: CT ABDOMEN AND PELVIS WITH CONTRAST TECHNIQUE: Multidetector CT imaging of the abdomen and pelvis was performed using the standard protocol following bolus administration of intravenous  contrast. CONTRAST:  169mL ISOVUE-300 IOPAMIDOL (ISOVUE-300) INJECTION 61% COMPARISON:  PET-CT dated January 08, 2018. CT abdomen pelvis dated July 18, 2017. FINDINGS: Lower chest: No acute abnormality. Hepatobiliary: Small hypodense lesions in the left and right hepatic lobes measuring up to 1.1 cm are unchanged, likely small cysts. There is a 1.3 cm discontinuous arterially enhancing lesion in the inferior right hepatic lobe which demonstrates enhancement on delayed images, consistent with a hemangioma. Status post cholecystectomy. Unchanged mild central intrahepatic biliary dilatation. Pancreas: Unremarkable. Prominence of the main pancreatic duct is similar to prior studies. No surrounding inflammatory changes. Spleen: Normal in size without focal abnormality. Adrenals/Urinary Tract: Adrenal glands are unremarkable. Kidneys are normal, without renal calculi, focal lesion, or hydronephrosis. Bladder is moderately distended. Stomach/Bowel: Appendix is fluid-filled and dilated, containing several appendicoliths, with mild periappendiceal inflammatory changes. Appendix: Location: Medial to the cecum coursing posteriorly. Diameter: 11 mm.  Appendicolith: One at the tip and one at the base. Mucosal hyper-enhancement: Present. Extraluminal gas: None. Periappendiceal collection: None. Stomach is within normal limits. No evidence of bowel wall thickening, distention, or inflammatory changes. Left-sided colonic diverticulosis. Vascular/Lymphatic: Aortic atherosclerosis. No enlarged abdominal or pelvic lymph nodes. Reproductive: Status post hysterectomy. No adnexal masses. Other: Small bilateral fat containing inguinal hernias. No free fluid or pneumoperitoneum. Musculoskeletal: No acute or significant osseous findings. Unchanged bilateral L5 pars defects. IMPRESSION: 1. Acute appendicitis.  No perforation or abscess. 2.  Aortic atherosclerosis (ICD10-I70.0). Electronically Signed   By: Titus Dubin M.D.   On: 02/06/2018 18:25   Dg Chest Port 1 View  Result Date: 02/06/2018 CLINICAL DATA:  Preop for appendicitis.  Dyspnea. EXAM: PORTABLE CHEST 1 VIEW COMPARISON:  07/24/2017 CXR, PET-CT 01/08/2018 FINDINGS: Ill-defined left apical opacity is noted that may represent stereotactic body radiation therapy change for known metabolically active left apical pulmonary nodule as seen on prior PET-CT. Emphysematous hyperinflation of the lungs without acute pulmonary consolidation or CHF. No effusion is identified though the right lateral costophrenic angle is minimally excluded. Heart size is within normal limits. Moderate aortic atherosclerosis is seen. IMPRESSION: Emphysematous hyperinflation of the lungs with post treatment change felt to account for the ill-defined opacity at the left lung apex post SBRT. No acute pulmonary consolidation suspicious for pneumonia. Electronically Signed   By: Ashley Royalty M.D.   On: 02/06/2018 23:12    Phillips Climes M.D on 02/11/2018 at 1:18 PM  Between 7am to 7pm - Pager - [phone]  After 7pm go to [email or web address] - password [facility]  [facility] -  Office  [phone]

## 2018-02-11 NOTE — Progress Notes (Signed)
[facility] Progress Note     Subjective: CC-  Patient states that she started having pain across her back last night. Denies dysuria, but states that her urine is dark and has a strong odor. Otherwise feels about the same. Continues to have some RLQ pain. Tolerating fulls. No emesis. Baseline nausea. Reports 2-3 loose BMs yesterday.  Objective: Vital signs in last 24 hours: Temp:  [98.4 F (36.9 C)-98.8 F (37.1 C)] 98.8 F (37.1 C) (07/23 0650) Pulse Rate:  [81-85] 82 (07/23 0720) Resp:  [18-22] 22 (07/23 0650) BP: (140-187)/(68-80) 165/72 (07/23 0720) SpO2:  [100 %] 100 % (07/23 0650) Last BM Date: 02/10/18  Intake/Output from previous day: 07/22 0701 - 07/23 0700 In: 580 [P.O.:480; IV Piggyback:100] Out: -  Intake/Output this shift: No intake/output data recorded.  PE: Gen: Alert, NAD, pleasant HEENT: EOM's intact, pupils equal and round Card: RRR Pulm:CTAB, effort normal on 8L O2 [place] HYI:FOYD healed midline incision, soft,nondistended, +BS,mild TTP RLQ with guarding/no rebound tenderness. No CVA tenderness XAJ:OINOMV soft and nontender Psych: A&Ox3  Skin: no rashes noted, warm and dry  Lab Results:  Recent Labs    02/10/18 0442 02/11/18 0420  WBC 7.1 6.8  HGB 10.0* 11.1*  HCT 30.4* 34.2*  PLT 310 383   BMET Recent Labs    02/10/18 0442 02/11/18 0420  NA 137 138  K 3.1* 3.5  CL 100 98  CO2 29 31  GLUCOSE 113* 97  BUN <5* <5*  CREATININE 0.50 0.61  CALCIUM 8.4* 9.0   PT/INR No results for input(s): LABPROT, INR in the last 72 hours. CMP     Component Value Date/Time   NA 138 02/11/2018 0420   K 3.5 02/11/2018 0420   CL 98 02/11/2018 0420   CO2 31 02/11/2018 0420   GLUCOSE 97 02/11/2018 0420   BUN <5 (L) 02/11/2018 0420   CREATININE 0.61 02/11/2018 0420   CREATININE 0.80 01/13/2018 1335   CREATININE 0.72 08/05/2017 1440   CALCIUM 9.0 02/11/2018 0420   PROT 6.8 02/06/2018 1656   ALBUMIN 3.6 02/06/2018 1656   AST 21  02/06/2018 1656   AST 21 01/13/2018 1335   ALT 12 02/06/2018 1656   ALT 10 01/13/2018 1335   ALKPHOS 72 02/06/2018 1656   BILITOT 0.2 (L) 02/06/2018 1656   BILITOT 0.2 01/13/2018 1335   GFRNONAA >60 02/11/2018 0420   GFRNONAA >60 01/13/2018 1335   GFRNONAA 81 08/05/2017 1440   GFRAA >60 02/11/2018 0420   GFRAA >60 01/13/2018 1335   GFRAA 94 08/05/2017 1440   Lipase     Component Value Date/Time   LIPASE 33 02/06/2018 1656       Studies/Results: No results found.  Anti-infectives: Anti-infectives (From admission, onward)   Start     Dose/Rate Route Frequency Ordered Stop   02/07/18 0600  ceFEPIme (MAXIPIME) 2 g in sodium chloride 0.9 % 100 mL IVPB  Status:  Discontinued     2 g 200 mL/hr over 30 Minutes Intravenous Every 12 hours 02/07/18 0106 02/07/18 0122   02/07/18 0600  ceFEPIme (MAXIPIME) 1 g in sodium chloride 0.9 % 100 mL IVPB     1 g 200 mL/hr over 30 Minutes Intravenous Every 12 hours 02/07/18 0122     02/07/18 0400  metroNIDAZOLE (FLAGYL) IVPB 500 mg  Status:  Discontinued     500 mg 100 mL/hr over 60 Minutes Intravenous Every 8 hours 02/07/18 0106 02/07/18 0122   02/07/18 0400  metroNIDAZOLE (FLAGYL) IVPB 500 mg  500 mg 100 mL/hr over 60 Minutes Intravenous Every 8 hours 02/07/18 0122     02/07/18 0030  piperacillin-tazobactam (ZOSYN) IVPB 3.375 g     3.375 g 100 mL/hr over 30 Minutes Intravenous  Once 02/07/18 0017 02/07/18 0120   02/06/18 2245  piperacillin-tazobactam (ZOSYN) IVPB 3.375 g     3.375 g 100 mL/hr over 30 Minutes Intravenous  Once 02/06/18 2240 02/06/18 2332   02/06/18 1845  cefTRIAXone (ROCEPHIN) 1 g in sodium chloride 0.9 % 100 mL IVPB     1 g 200 mL/hr over 30 Minutes Intravenous  Once 02/06/18 1837 02/06/18 1924   02/06/18 1845  metroNIDAZOLE (FLAGYL) IVPB 500 mg     500 mg 100 mL/hr over 60 Minutes Intravenous  Once 02/06/18 1837 02/06/18 2032       Assessment/Plan Severe COPD - on 8L O2 at home, 10mg  prednisone daily,  continue home meds H/o 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 pulm nodule s/p SBRT x6 fractions CAD h/o STEMI 07/2017 treated with medical management, on Plavix (last dose 7/18) HTN - appreciate triad assistance, on cozaar, norvasc, metoprolol, and hydralazine GERD - protonix H/o PAF - not on anticoagulation, currently in NSR Prior h/o DVT  Code status DNR/DNI  Acute appendicitis without abscess or perforation - 4 days of abdominal pain and nausea prior to admission - CT scan 7/18 showed acute appendicitis with dilated appendix, several appendicoliths andmild periappendiceal inflammatory changes - patient is very high operative risk from pulmonary standpoint - she expressed that she does not want any procedure that would require intubation due to elevated risk of ventilator dependence for the remainder of her life - treating nonop with abx  ID -maxipime 7/19>>, flagyl 7/19>>, zosyn x1 7/19, rocephin x1 7/18 FEN -IVF, Boost, soft diet VTE -SCDs, lovenox Foley -none  Plan - Check u/a and Ucx. WBC WNL and patient afebrile but she does still have some RLQ TTP, continue IV antibiotics. [place] for soft diet. Continue therapies/OOB.    LOS: 5 days    Wellington Hampshire , [facility] Surgery 02/11/2018, 8:17 AM Pager: [phone] Consults: [phone] Mon 7:00 am -11:30 AM Tues-Fri 7:00 am-4:30 pm Sat-Sun 7:00 am-11:30 am

## 2018-02-11 NOTE — Progress Notes (Signed)
Daily Progress Note   Patient Name: Tamara Adams       Date: 02/11/2018 DOB: [birth year]  Age: 77 y.o. MRN#: [id number] Attending Physician: Nolon Nations, MD Primary Care Physician: [name], MD Admit Date: 02/06/2018  Reason for Consultation/Follow-up: Establishing goals of care  Subjective: Patient sitting up in bed. Bedside RN present preparing to administer pain medication. Patient complains of RLQ pain. Per RN patient is requiring IV pain medication every 3hrs with breakthrough po medications as prescribed. Patient states pain medication works but just doesn't seem to last long enough. She is hopeful she will be able to go home tomorrow but somewhat worried about pain being controlled after discharge. We discussed that she would be discharged home with appropriate medications. She is tolerating full liquid diet without complications and denies constipation. Last bowel movement was yesterday (7/22). She reports some nausea at time, which is baseline and controlled. Reviewed with her the plan at discharge to return home with outpatient palliative services. Patient verbalized agreement and understanding. She reports her son and granddaughter are also aware. We discussed that she wold most likely continue on antibiotics per Surgery at discharge and it will be important for her to continue getting out of bed and performing activities as tolerated. She has been seen by PT with no further recommendations for follow-up.   Length of Stay: 5  Current Medications: Scheduled Meds:  . amLODipine  10 mg Oral Daily  . docusate sodium  100 mg Oral Daily  . dronabinol  5 mg Oral BID  . enoxaparin (LOVENOX) injection  30 mg Subcutaneous Q24H  . famotidine  20 mg Oral Daily  . fluticasone  furoate-vilanterol  1 puff Inhalation Daily  . guaiFENesin  600 mg Oral BID  . hydrALAZINE  75 mg Oral Q8H  . lactose free nutrition  237 mL Oral TID WC  . losartan  100 mg Oral Daily  . metoprolol tartrate  50 mg Oral BID  . multivitamin with minerals  1 tablet Oral Daily  . pantoprazole  80 mg Oral Daily  . predniSONE  10 mg Oral Q breakfast  . umeclidinium bromide  1 puff Inhalation Daily    Continuous Infusions: . ceFEPime (MAXIPIME) IV Stopped (02/11/18 0254)   And  . metronidazole Stopped (02/11/18 1247)    PRN Meds: acetaminophen,  albuterol, diphenhydrAMINE **OR** diphenhydrAMINE, HYDROcodone-acetaminophen, lidocaine, morphine injection, ondansetron **OR** ondansetron (ZOFRAN) IV, promethazine, traMADol  Physical Exam  Constitutional: She is oriented to person, place, and time. She is cooperative.  Cardiovascular: Regular rhythm, normal heart sounds and normal pulses.  Pulmonary/Chest: Effort normal. She has decreased breath sounds.  Abdominal: Soft. Bowel sounds are normal. There is tenderness.  Neurological: She is alert and oriented to person, place, and time.  Psychiatric: Judgment normal. Cognition and memory are normal.  Nursing note and vitals reviewed.            Vital Signs: BP (!) 149/66 (BP Location: Left Arm)   Pulse 85   Temp 98.1 F (36.7 C) (Oral)   Resp 16   Ht 5' 3.5" (1.613 m)   Wt 48 kg (105 lb 14.4 oz)   SpO2 100%   BMI 18.47 kg/m  SpO2: SpO2: 100 % O2 Device: O2 Device: Nasal Cannula O2 Flow Rate: O2 Flow Rate (L/min): 9 L/min  Intake/output summary:   Intake/Output Summary (Last 24 hours) at 02/11/2018 1521 Last data filed at 02/11/2018 1300 Gross per 24 hour  Intake 460 ml  Output -  Net 460 ml   LBM: Last BM Date: 02/10/18 Baseline Weight: Weight: 44.9 kg (99 lb) Most recent weight: Weight: 48 kg (105 lb 14.4 oz)      Palliative Assessment/Data: PPS 40%   Flowsheet Rows     Most Recent Value  Intake Tab  Referral Department   Hospitalist  Unit at Time of Referral  Med/Surg Unit  Palliative Care Primary Diagnosis  Other (Comment) [appendicitis and deemed not a surgical candidate due to high]  Date Notified  02/07/18  Palliative Care Type  New Palliative care  Reason for referral  Clarify Goals of Care  Date of Admission  02/06/18  Date first seen by Palliative Care  02/07/18  # of days Palliative referral response time  0 Day(s)  # of days IP prior to Palliative referral  1  Clinical Assessment  Psychosocial & Spiritual Assessment  Palliative Care Outcomes      Patient Active Problem List   Diagnosis Date Noted  . Do not intubate, cardiopulmonary resuscitation (CPR)-only code status   . Preoperative clearance   . Appendicitis 02/06/2018  . Pressure injury of skin 09/21/2017  . Acute and chronic respiratory failure with hypoxia ([place]) 09/21/2017  . Chest pain 09/20/2017  . Nodule of left lung 08/29/2017  . IDA (iron deficiency anemia) 08/27/2017  . Non-ST elevation (NSTEMI) myocardial infarction ([place]) 08/07/2017  . Paroxysmal atrial fibrillation (HCC)   . Encounter for nasogastric (NG) tube placement   . SBO (small bowel obstruction) ([place]) 07/19/2017  . Caregiver burden 04/16/2017  . IFG (impaired fasting glucose) 10/08/2016  . Chronic pain syndrome 07/25/2016  . Physical deconditioning   . Hyponatremia   . SOB (shortness of breath)   . Constipation 03/25/2016  . Barrett's esophagus without dysplasia 12/15/2015  . Ileus ([place]) 12/02/2015  . Hypokalemia 12/02/2015  . Essential hypertension   . Esophageal reflux   . Depression with anxiety   . Generalized abdominal pain   . Leukocytosis 01/12/2015  . Aortic atherosclerosis ([place]) 10/05/2014  . Abnormal CT of liver 02/05/2014  . Severe protein-calorie malnutrition ([place]) 01/29/2014  . Small bowel obstruction ([place]) 01/28/2014  . Chronic hypoxemic respiratory failure ([place]) 12/07/2013  . Lumbar degenerative disc disease 05/28/2013  . Gastric ulcer  04/22/2013  . Other malaise and fatigue 10/09/2012  . CHEST PAIN, PRECORDIAL 10/04/2010  .  HLD (hyperlipidemia) 09/14/2010  . History of tobacco abuse 09/14/2010  . End stage COPD ([place]) 09/14/2010  . Insomnia 09/14/2010  . PALPITATIONS 09/14/2010  . CAROTID BRUIT 09/14/2010    Palliative Care Assessment & Plan   Patient Profile: 77 y.o. female admitted on 02/06/2018 from home with acute on chronic abdominal pain for 3-4 days prior to admission.  Patient has a past medical history significant for arthritis, COPD (home oxygen use 8 L nasal cannula), hypertension, GERD, hyperlipidemia, MI (06/2017), IDA, tobacco use, left lung cancer s/p radiation, and small bowel obstruction. On presentation to ER patient complained of right lower quadrant pain with nausea. She denied vomiting or diarrhea. Denied fever or chills. During her ER course CT abdomen showed acute appendicitis. She was seen by Pulmonary critical care and was deemed to be a very high risk for surgery and the plan was made to manager patient conservatively with antibiotics. She continues to be followed by Surgery. Palliative Medicine consulted for goals of care discussion.   Recommendations/Plan:  DNR/DNI-as confirmed by patient  Continue to treat the treatable while hospitalized.  Patient verbalized understanding and agreement that she is not a surgical candidate for her appendicitis and is hopeful that her antibiotics will manage her condition.  She is aware of the potential risk in regards to her condition.  Case management consult for outpatient palliative services at discharge.  Palliative team will continue to support patient, patient family, and medical team during hospitalization.  Goals of Care and Additional Recommendations:  Limitations on Scope of Treatment: Full Scope Treatment  Code Status:    Code Status Orders  (From admission, onward)        Start     Ordered   02/07/18 1556  Do not attempt resuscitation  (DNR)  Continuous    Question Answer Comment  In the event of cardiac or respiratory ARREST Do not call a "code blue"   In the event of cardiac or respiratory ARREST Do not perform Intubation, CPR, defibrillation or ACLS   In the event of cardiac or respiratory ARREST Use medication by any route, position, wound care, and other measures to relive pain and suffering. May use oxygen, suction and manual treatment of airway obstruction as needed for comfort.      02/07/18 1556    Code Status History    Date Active Date Inactive Code Status Order ID Comments User Context   02/07/2018 0106 02/07/2018 1556 Full Code [id number]  Leighton Ruff, MD Inpatient   02/07/2018 0021 02/07/2018 0106 DNR [id number]  Kandice Hams, MD ED   09/20/2017 1316 09/22/2017 1548 Full Code [id number]  Phillips Grout, MD Inpatient   07/21/2017 1338 07/27/2017 1749 Full Code [id number]  Dessa Phi, DO Inpatient   07/19/2017 0407 07/21/2017 1338 DNR [id number]  Etta Quill, DO Inpatient   03/30/2016 1944 04/03/2016 2010 DNR [id number]  Etta Quill, DO Inpatient   03/26/2016 0844 03/28/2016 2012 DNR [id number]  Reyne Dumas, MD Inpatient   03/25/2016 0254 03/26/2016 0844 Full Code [id number]  Norval Morton, MD Inpatient   12/02/2015 2204 12/06/2015 1743 Full Code [id number]  Ivor Costa, MD Inpatient   01/11/2015 2018 01/14/2015 1739 Full Code [id number]  Oswald Hillock, MD ED   12/24/2014 1840 12/29/2014 1311 Full Code [id number]  Coralie Keens, MD Inpatient   01/28/2014 0454 01/31/2014 1233 Full Code [id number]  Leighton Ruff, MD Inpatient   08/15/2013 0026 08/19/2013 1209 Full Code [id number]  Ralene Ok, MD ED  Advance Directive Documentation     Most Recent Value  Type of Advance Directive  Living will  Pre-existing out of facility DNR order (yellow form or pink MOST form)  -  "MOST" Form in Place?  -       Prognosis:  guarded to poor in the setting of appendicitis and deemed not a surgical candidate due to high risk  of respiratory failure/complications, hypertension, MI, poor p.o. intake, deconditioned, malnutrition, lung cancer status post radiation therapy, and COPD with continuous home oxygen use of 8 L/[place].  Discharge Planning:  Home with [place] was discussed with patient and bedside RN.   Thank you for allowing the Palliative Medicine Team to assist in the care of this patient.   Total Time 25 min.  Prolonged Time Billed  NO       Greater than 50%  of this time was spent counseling and coordinating care related to the above assessment and plan.  Alda Lea, NP-BC Palliative Medicine Team  Phone: [phone] Fax: [phone] Pager: [phone] Amion: Bjorn Pippin   Please contact Palliative Medicine Team phone at [phone] for questions and concerns.

## 2018-02-12 DIAGNOSIS — I10 Essential (primary) hypertension: Secondary | ICD-10-CM

## 2018-02-12 LAB — CBC
HCT: 29.9 % — ABNORMAL LOW (ref 36.0–46.0)
Hemoglobin: 9.7 g/dL — ABNORMAL LOW (ref 12.0–15.0)
MCH: 30.9 pg (ref 26.0–34.0)
MCHC: 32.4 g/dL (ref 30.0–36.0)
MCV: 95.2 fL (ref 78.0–100.0)
Platelets: 327 10*3/uL (ref 150–400)
RBC: 3.14 MIL/uL — ABNORMAL LOW (ref 3.87–5.11)
RDW: 13 % (ref 11.5–15.5)
WBC: 6.4 10*3/uL (ref 4.0–10.5)

## 2018-02-12 LAB — BASIC METABOLIC PANEL
Anion gap: 7 (ref 5–15)
BUN: 11 mg/dL (ref 8–23)
CALCIUM: 9 mg/dL (ref 8.9–10.3)
CO2: 31 mmol/L (ref 22–32)
CREATININE: 0.69 mg/dL (ref 0.44–1.00)
Chloride: 100 mmol/L (ref 98–111)
Glucose, Bld: 105 mg/dL — ABNORMAL HIGH (ref 70–99)
Potassium: 4.9 mmol/L (ref 3.5–5.1)
SODIUM: 138 mmol/L (ref 135–145)

## 2018-02-12 LAB — URINE CULTURE: Culture: NO GROWTH

## 2018-02-12 MED ORDER — AMOXICILLIN-POT CLAVULANATE 875-125 MG PO TABS: 1.0000 | ORAL_TABLET | Freq: Two times a day (BID) | ORAL | 0 refills | Status: AC

## 2018-02-12 MED ORDER — AMLODIPINE BESYLATE 10 MG PO TABS: 10.0000 mg | ORAL_TABLET | Freq: Every day | ORAL | 1 refills | Status: DC

## 2018-02-12 MED ORDER — HYDRALAZINE HCL 25 MG PO TABS: 75.0000 mg | ORAL_TABLET | Freq: Three times a day (TID) | ORAL | 1 refills | Status: DC

## 2018-02-12 MED ORDER — HYDROCODONE-ACETAMINOPHEN 10-325 MG PO TABS: 1.0000 | ORAL_TABLET | Freq: Four times a day (QID) | ORAL | 0 refills | Status: DC | PRN

## 2018-02-12 NOTE — Discharge Summary (Signed)
[facility] Discharge Summary   Patient ID: Tamara Adams MRN: [id number] DOB/AGE: 77-Oct-1942 77 y.o.  Admit date: 02/06/2018 Discharge date: 02/12/2018  Admitting Diagnosis: Acute appendicitis  Discharge Diagnosis Patient Active Problem List   Diagnosis Date Noted  . Do not intubate, cardiopulmonary resuscitation (CPR)-only code status   . Preoperative clearance   . Appendicitis 02/06/2018  . Pressure injury of skin 09/21/2017  . Acute and chronic respiratory failure with hypoxia ([place]) 09/21/2017  . Chest pain 09/20/2017  . Nodule of left lung 08/29/2017  . IDA (iron deficiency anemia) 08/27/2017  . Non-ST elevation (NSTEMI) myocardial infarction ([place]) 08/07/2017  . Paroxysmal atrial fibrillation (HCC)   . Encounter for nasogastric (NG) tube placement   . SBO (small bowel obstruction) ([place]) 07/19/2017  . Caregiver burden 04/16/2017  . IFG (impaired fasting glucose) 10/08/2016  . Chronic pain syndrome 07/25/2016  . Physical deconditioning   . Hyponatremia   . SOB (shortness of breath)   . Constipation 03/25/2016  . Barrett's esophagus without dysplasia 12/15/2015  . Ileus ([place]) 12/02/2015  . Hypokalemia 12/02/2015  . Essential hypertension   . Esophageal reflux   . Depression with anxiety   . Generalized abdominal pain   . Leukocytosis 01/12/2015  . Aortic atherosclerosis ([place]) 10/05/2014  . Abnormal CT of liver 02/05/2014  . Severe protein-calorie malnutrition ([place]) 01/29/2014  . Small bowel obstruction ([place]) 01/28/2014  . Chronic hypoxemic respiratory failure ([place]) 12/07/2013  . Lumbar degenerative disc disease 05/28/2013  . Gastric ulcer 04/22/2013  . Other malaise and fatigue 10/09/2012  . CHEST PAIN, PRECORDIAL 10/04/2010  . HLD (hyperlipidemia) 09/14/2010  . History of tobacco abuse 09/14/2010  . End stage COPD ([place]) 09/14/2010  . Insomnia 09/14/2010  . PALPITATIONS 09/14/2010  . CAROTID BRUIT 09/14/2010    Consultants Pulmonology Internal  medicine Palliative care  Imaging: No results found.  Procedures None  Hospital Course:  Patient is a 77 year old female with significant COPD who presented to [facility] with 4 days of abdominal pain.  Workup showed acute appendicitis.  Patient was admitted and pulmonology consulted for surgical clearance. Patient expressed desire for non-operative therapy given high risk of not being able to come off the ventilator if she had surgery. Patient was started on antibiotic therapy and followed with serial abdominal exams. Internal medicine consulted for assistance in managing multiple medical issues. Palliative medicine consulted to establish clear goals of care. Diet was advanced as tolerated. Patient noted to have some poorly controlled HTN and adjustments made to antihypertensive medications. She will follow up with PCP and cardiology once discharged.   On 02/12/18, the patient was voiding well, tolerating diet, ambulating well, pain well controlled, vital signs stable and felt stable for discharge home.  Patient will follow up in our office in 2-4 weeks and knows to call with questions or concerns. She will call to confirm appointment date/time.    Physical Exam: Gen: Alert, NAD, pleasant HEENT: EOM's intact, pupils equal and round Card: RRR Pulm:CTAB, effort normal on 8L O2 [place] IHK:VQQV healed midline incision, soft,nondistended, +BS,no TTP. No CVA tenderness ZDG:LOVFIE soft and nontender Psych: A&Ox3  Skin: no rashes noted, warm and dry   Allergies as of 02/12/2018      Reactions   Tyloxapol Itching   Maxidex [dexamethasone] Other (See Comments)   DEHYDRATION AND HEART RACING   Advair Diskus [fluticasone-salmeterol] Other (See Comments)   No benefit with lungs (INEFFECTIVE)   Remeron [mirtazapine] Other (See Comments)   CAUSED NIGHTMARES   Trazodone  And Nefazodone Other (See Comments)   Heart pounding   Tylox [oxycodone-acetaminophen] Itching      Medication List    TAKE  these medications   acetaminophen 325 MG tablet Commonly known as:  TYLENOL Take 325-650 mg by mouth every 6 (six) hours as needed for headache.   albuterol (2.5 MG/3ML) 0.083% nebulizer solution Commonly known as:  PROVENTIL USE 1 VIAL (3 ML) VIA NEBULIZER EVERY 6 HOURS AS NEEDED FOR WHEEZING OR SHORTNESS OF BREATH   AMBULATORY NON FORMULARY MEDICATION Medication Name: Needs oxygen concentrator set to 6 liter per minutes as she has a long tubing on her machine.  Fax to [place] What changed:  additional instructions   AMBULATORY NON FORMULARY MEDICATION Medication Name: GI cocktail: 159mL Lidocaine2%, 1108ml Maalox, 55ml Donnatol. Ok to take twice daily. What changed:  Another medication with the same name was changed. Make sure you understand how and when to take each.   amLODipine 10 MG tablet Commonly known as:  NORVASC Take 1 tablet (10 mg total) by mouth daily.   amoxicillin-clavulanate 875-125 MG tablet Commonly known as:  AUGMENTIN Take 1 tablet by mouth every 12 (twelve) hours for 7 days.   BREO ELLIPTA 100-25 MCG/INH Aepb Generic drug:  fluticasone furoate-vilanterol USE 1 INHALATION DAILY   clopidogrel 75 MG tablet Commonly known as:  PLAVIX TAKE 1 TABLET DAILY   docusate sodium 100 MG capsule Commonly known as:  COLACE Take 1 capsule (100 mg total) by mouth daily.   dronabinol 5 MG capsule Commonly known as:  MARINOL Take 1 capsule (5 mg total) by mouth 2 (two) times daily.   feeding supplement (BOOST BREEZE) Liqd Take 1 Can by mouth 3 (three) times daily.   feeding supplement Liqd Commonly known as:  BOOST / RESOURCE BREEZE Take 1 Container by mouth 2 (two) times daily between meals. Chocolate boost   guaiFENesin 600 MG 12 hr tablet Commonly known as:  MUCINEX Take 1 tablet (600 mg total) by mouth 2 (two) times daily.   hydrALAZINE 25 MG tablet Commonly known as:  APRESOLINE Take 3 tablets (75 mg total) by mouth every 8 (eight) hours.    HYDROcodone-acetaminophen 10-325 MG tablet Commonly known as:  NORCO Take 1 tablet by mouth every 6 (six) hours as needed for moderate pain.   INCRUSE ELLIPTA 62.5 MCG/INH Aepb Generic drug:  umeclidinium bromide USE 1 INHALATION DAILY   lidocaine 2 % solution Commonly known as:  XYLOCAINE Use as directed 20 mLs in the mouth or throat 2 (two) times daily as needed. What changed:  reasons to take this   losartan 100 MG tablet Commonly known as:  COZAAR Take 1 tablet (100 mg total) by mouth daily.   metoprolol tartrate 50 MG tablet Commonly known as:  LOPRESSOR Take 1 tablet (50 mg total) by mouth 2 (two) times daily.   multivitamin with minerals Tabs tablet Take 1 tablet by mouth daily. Pt uses One-A-Day brand   omeprazole 40 MG capsule Commonly known as:  PRILOSEC Take 1 capsule (40 mg total) by mouth 2 (two) times daily.   ondansetron 8 MG disintegrating tablet Commonly known as:  ZOFRAN-ODT PLACE 1 TABLET ON THE TONGUE EVERY 8 HOURS AS NEEDED FOR NAUSEA   polyethylene glycol packet Commonly known as:  MIRALAX / GLYCOLAX Take 17 g by mouth daily.   predniSONE 10 MG tablet Commonly known as:  DELTASONE Take 1 tablet (10 mg total) by mouth daily with breakfast.   promethazine 25 MG tablet Commonly  known as:  PHENERGAN Take 1 tablet (25 mg total) by mouth every 8 (eight) hours as needed for nausea or vomiting.   ranitidine 150 MG tablet Commonly known as:  ZANTAC Take 1 tablet (150 mg total) by mouth daily.   sucralfate 1 GM/10ML suspension Commonly known as:  CARAFATE Take 10 mLs (1 g total) by mouth 4 (four) times daily -  with meals and at bedtime.   traMADol 50 MG tablet Commonly known as:  ULTRAM Take 1 tablet (50 mg total) by mouth every 6 (six) hours as needed (pain).        Follow-up Information    [name], MD. Call.   Specialty:  Family Medicine Why:  Call and schedule an appointment to see in the next 1-2 weeks for post-hospital  assessment. Contact information: [place] [address] [phone]        Nahser, Wonda Cheng, MD. Call.   Specialty:  Cardiology Why:  Call and schedule a follow up appointment regarding changes in medications for blood pressure. Contact information: [place] [address] [phone]        Coralie Keens, MD. Go on 03/05/2018.   Specialty:  General Surgery Why:  Your appointment is scheduled for 10:40. Please arrive 30 min prior to appointment time for check in. Bring photo ID and insurance information.  Contact information: [address] [place] [address] [phone]           Signed: Brigid Re, [facility] Surgery 02/12/2018, 8:19 AM Pager: [phone] Mon-Fri 7:00 am-4:30 pm Sat-Sun 7:00 am-11:30 am

## 2018-02-12 NOTE — Progress Notes (Signed)
Patient ID: Tamara Adams, female   DOB: 1941-05-01, 77 y.o.   MRN: [id number]                                                                PROGRESS NOTE                                                                                                                                                                                                             Patient Demographics:    Tamara Adams, is a 77 y.o. female, DOB - July 17, 1941, HEN:[id number]  Admit date - 02/06/2018   Admitting Physician Md Edison Pace, MD  Outpatient Primary MD for the patient is [name], MD  LOS - 6  Outpatient Specialists:     Chief Complaint  Patient presents with  . Abdominal Pain  . Shortness of Breath       Brief Narrative    77 y.o.female WITH History of severe COPD on 8 L oxygen, hypertension, CAD resents to the ER because of worsening abdominal pain.   Work-up significant for acute appendicitis , currently no operative management has been pursued given the has been advanced COPD, and her DNR/DNI status.     Subjective:    Tamara Adams today has some nausea, but abd pain has improved.  None currently.  Able to tolerate soft diet.   No headache, No chest pain,  No new weakness tingling or numbness, No Cough - SOB.    Assessment  & Plan :    Principal Problem:   Appendicitis Active Problems:   End stage COPD ([place])   Chronic hypoxemic respiratory failure (H[facility])   Essential hypertension   Paroxysmal atrial fibrillation (HCC)    Acute appendicitis -Management per primary surgical team, but she is high risk for surgery, as well she is known to have advanced COPD, expected to be very difficult to extubate after surgery, she did not want trach . -So current management with antibiotics, she is afebrile, leukocytosis has normalized, appears to be improving, afebrile, leukocytosis has normalized today, as well abdominal pain has improved, but still significant, tolerating clear liquid diet.   Diet is being advanced by surgery as well as dispo  Severe COPD with chronic respiratory failure on 8 L oxygen.  -Appears to be at baseline at this  point, 10 you with nebs, and home dose prednisone, -Has been seen by PCCM.  Hypertension -Overall uncontrolled, is on home dose Cozaar and metoprolol, remains elevated despite adding Norvasc, and hydralazine, I will increase hydralazine from 50 3 times daily to 75 mg 3 times daily . Will continue to monitor, if bp remains high will increase metoprolol to 75mg  po bid -10 you with PRN IV hydralazine  History of CAD -She is on Plavix and metoprolol, currently Plavix on hold  in case she will end up needing procedure/drain.  Patient had abnormal analysis obtained by general surgery, follow urine cultures, she denies dysuria or polyuria.     Code Status : DNR, confirmed by the patient  Family Communication  : None at bedside  Procedures  : none  DVT Prophylaxis  :  [place] lovenox  RecentLabs       Lab Results  Component Value Date   PLT 383 02/11/2018      Antibiotics  :  cefepime         Anti-infectives (From admission, onward)   Start     Dose/Rate Route Frequency Ordered Stop   02/07/18 0600  ceFEPIme (MAXIPIME) 2 g in sodium chloride 0.9 % 100 mL IVPB  Status:  Discontinued     2 g 200 mL/hr over 30 Minutes Intravenous Every 12 hours 02/07/18 0106 02/07/18 0122   02/07/18 0600  ceFEPIme (MAXIPIME) 1 g in sodium chloride 0.9 % 100 mL IVPB     1 g 200 mL/hr over 30 Minutes Intravenous Every 12 hours 02/07/18 0122     02/07/18 0400  metroNIDAZOLE (FLAGYL) IVPB 500 mg  Status:  Discontinued     500 mg 100 mL/hr over 60 Minutes Intravenous Every 8 hours 02/07/18 0106 02/07/18 0122   02/07/18 0400  metroNIDAZOLE (FLAGYL) IVPB 500 mg     500 mg 100 mL/hr over 60 Minutes Intravenous Every 8 hours 02/07/18 0122     02/07/18 0030  piperacillin-tazobactam (ZOSYN) IVPB 3.375 g     3.375 g 100 mL/hr over 30 Minutes  Intravenous  Once 02/07/18 0017 02/07/18 0120   02/06/18 2245  piperacillin-tazobactam (ZOSYN) IVPB 3.375 g     3.375 g 100 mL/hr over 30 Minutes Intravenous  Once 02/06/18 2240 02/06/18 2332   02/06/18 1845  cefTRIAXone (ROCEPHIN) 1 g in sodium chloride 0.9 % 100 mL IVPB     1 g 200 mL/hr over 30 Minutes Intravenous  Once 02/06/18 1837 02/06/18 1924   02/06/18 1845  metroNIDAZOLE (FLAGYL) IVPB 500 mg     500 mg 100 mL/hr over 60 Minutes Intravenous  Once 02/06/18 1837 02/06/18 2032        Objective:   Vitals:   02/11/18 0720 02/11/18 1303 02/11/18 2148 02/12/18 0458  BP: (!) 165/72 (!) 149/66 (!) 159/74 135/61  Pulse: 82 85 79 70  Resp:  16 16 16   Temp:  98.1 F (36.7 C) 98.7 F (37.1 C) 98.6 F (37 C)  TempSrc:  Oral Oral Oral  SpO2:  100% 99% 100%  Weight:      Height:        Wt Readings from Last 3 Encounters:  02/07/18 48 kg (105 lb 14.4 oz)  01/27/18 45 kg (99 lb 3.2 oz)  01/13/18 43.5 kg (96 lb)     Intake/Output Summary (Last 24 hours) at 02/12/2018 0557 Last data filed at 02/11/2018 2152 Gross per 24 hour  Intake 740 ml  Output 500 ml  Net 240 ml  Physical Exam  Awake Alert, Oriented X 3, No new F.N deficits, Normal affect [place].AT,PERRAL Supple Neck,No JVD, No cervical lymphadenopathy appriciated.  Symmetrical Chest wall movement, Good air movement bilaterally, CTAB RRR,No Gallops,Rubs or new Murmurs, No Parasternal Heave +ve B.Sounds, Abd Soft, No tenderness, No organomegaly appriciated, No rebound - guarding or rigidity. No Cyanosis, Clubbing or edema, No new Rash or bruise     Data Review:    CBC Recent Labs  Lab 02/06/18 1656 02/07/18 0449 02/08/18 0422 02/10/18 0442 02/11/18 0420 02/12/18 0446  WBC 10.9* 13.2* 9.5 7.1 6.8 6.4  HGB 12.1 10.7* 9.9* 10.0* 11.1* 9.7*  HCT 36.9 33.5* 31.1* 30.4* 34.2* 29.9*  PLT 325 356 312 310 383 327  MCV 93.9 95.7 96.3 91.3 93.4 95.2  MCH 30.8 30.6 30.7 30.0 30.3 30.9  MCHC 32.8 31.9 31.8 32.9 32.5  32.4  RDW 12.3 13.2 13.1 12.6 12.8 13.0  LYMPHSABS 1.6  --   --  1.2  --   --   MONOABS 1.1*  --   --  0.8  --   --   EOSABS 1.2*  --   --  1.4*  --   --   BASOSABS 0.1  --   --  0.1  --   --     Chemistries  Recent Labs  Lab 02/06/18 1656 02/07/18 0449 02/08/18 0422 02/10/18 0442 02/11/18 0420  NA 133* 139 141 137 138  K 4.3 3.9 4.0 3.1* 3.5  CL 93* 99 103 100 98  CO2 29 31 32 29 31  GLUCOSE 101* 123* 99 113* 97  BUN 8 9 12  <5* <5*  CREATININE 0.73 0.76 0.72 0.50 0.61  CALCIUM 8.6* 8.6* 8.7* 8.4* 9.0  AST 21  --   --   --   --   ALT 12  --   --   --   --   ALKPHOS 72  --   --   --   --   BILITOT 0.2*  --   --   --   --    ------------------------------------------------------------------------------------------------------------------ No results for input(s): CHOL, HDL, LDLCALC, TRIG, CHOLHDL, LDLDIRECT in the last 72 hours.  Lab Results  Component Value Date   HGBA1C 5.7 10/08/2016   ------------------------------------------------------------------------------------------------------------------ No results for input(s): TSH, T4TOTAL, T3FREE, THYROIDAB in the last 72 hours.  Invalid input(s): FREET3 ------------------------------------------------------------------------------------------------------------------ No results for input(s): VITAMINB12, FOLATE, FERRITIN, TIBC, IRON, RETICCTPCT in the last 72 hours.  Coagulation profile No results for input(s): INR, PROTIME in the last 168 hours.  No results for input(s): DDIMER in the last 72 hours.  Cardiac Enzymes No results for input(s): CKMB, TROPONINI, MYOGLOBIN in the last 168 hours.  Invalid input(s): CK ------------------------------------------------------------------------------------------------------------------    Component Value Date/Time   BNP 54.3 03/30/2016 1400    Inpatient Medications  Scheduled Meds: . amLODipine  10 mg Oral Daily  . docusate sodium  100 mg Oral Daily  . dronabinol  5 mg  Oral BID  . enoxaparin (LOVENOX) injection  30 mg Subcutaneous Q24H  . famotidine  20 mg Oral Daily  . fluticasone furoate-vilanterol  1 puff Inhalation Daily  . guaiFENesin  600 mg Oral BID  . hydrALAZINE  75 mg Oral Q8H  . lactose free nutrition  237 mL Oral TID WC  . losartan  100 mg Oral Daily  . metoprolol tartrate  50 mg Oral BID  . multivitamin with minerals  1 tablet Oral Daily  . pantoprazole  80 mg Oral Daily  .  predniSONE  10 mg Oral Q breakfast  . umeclidinium bromide  1 puff Inhalation Daily   Continuous Infusions: . ceFEPime (MAXIPIME) IV Stopped (02/11/18 1925)   And  . metronidazole 500 mg (02/12/18 0354)   PRN Meds:.acetaminophen, albuterol, diphenhydrAMINE **OR** diphenhydrAMINE, HYDROcodone-acetaminophen, lidocaine, morphine injection, ondansetron **OR** ondansetron (ZOFRAN) IV, promethazine, traMADol  Micro Results No results found for this or any previous visit (from the past 240 hour(s)).  Radiology Reports Ct Abdomen Pelvis W Contrast  Result Date: 02/06/2018 CLINICAL DATA:  Generalized abdominal pain for the past 3 days. EXAM: CT ABDOMEN AND PELVIS WITH CONTRAST TECHNIQUE: Multidetector CT imaging of the abdomen and pelvis was performed using the standard protocol following bolus administration of intravenous contrast. CONTRAST:  159mL ISOVUE-300 IOPAMIDOL (ISOVUE-300) INJECTION 61% COMPARISON:  PET-CT dated January 08, 2018. CT abdomen pelvis dated July 18, 2017. FINDINGS: Lower chest: No acute abnormality. Hepatobiliary: Small hypodense lesions in the left and right hepatic lobes measuring up to 1.1 cm are unchanged, likely small cysts. There is a 1.3 cm discontinuous arterially enhancing lesion in the inferior right hepatic lobe which demonstrates enhancement on delayed images, consistent with a hemangioma. Status post cholecystectomy. Unchanged mild central intrahepatic biliary dilatation. Pancreas: Unremarkable. Prominence of the main pancreatic duct is  similar to prior studies. No surrounding inflammatory changes. Spleen: Normal in size without focal abnormality. Adrenals/Urinary Tract: Adrenal glands are unremarkable. Kidneys are normal, without renal calculi, focal lesion, or hydronephrosis. Bladder is moderately distended. Stomach/Bowel: Appendix is fluid-filled and dilated, containing several appendicoliths, with mild periappendiceal inflammatory changes. Appendix: Location: Medial to the cecum coursing posteriorly. Diameter: 11 mm. Appendicolith: One at the tip and one at the base. Mucosal hyper-enhancement: Present. Extraluminal gas: None. Periappendiceal collection: None. Stomach is within normal limits. No evidence of bowel wall thickening, distention, or inflammatory changes. Left-sided colonic diverticulosis. Vascular/Lymphatic: Aortic atherosclerosis. No enlarged abdominal or pelvic lymph nodes. Reproductive: Status post hysterectomy. No adnexal masses. Other: Small bilateral fat containing inguinal hernias. No free fluid or pneumoperitoneum. Musculoskeletal: No acute or significant osseous findings. Unchanged bilateral L5 pars defects. IMPRESSION: 1. Acute appendicitis.  No perforation or abscess. 2.  Aortic atherosclerosis (ICD10-I70.0). Electronically Signed   By: Titus Dubin M.D.   On: 02/06/2018 18:25   Dg Chest Port 1 View  Result Date: 02/06/2018 CLINICAL DATA:  Preop for appendicitis.  Dyspnea. EXAM: PORTABLE CHEST 1 VIEW COMPARISON:  07/24/2017 CXR, PET-CT 01/08/2018 FINDINGS: Ill-defined left apical opacity is noted that may represent stereotactic body radiation therapy change for known metabolically active left apical pulmonary nodule as seen on prior PET-CT. Emphysematous hyperinflation of the lungs without acute pulmonary consolidation or CHF. No effusion is identified though the right lateral costophrenic angle is minimally excluded. Heart size is within normal limits. Moderate aortic atherosclerosis is seen. IMPRESSION:  Emphysematous hyperinflation of the lungs with post treatment change felt to account for the ill-defined opacity at the left lung apex post SBRT. No acute pulmonary consolidation suspicious for pneumonia. Electronically Signed   By: Ashley Royalty M.D.   On: 02/06/2018 23:12    Time Spent in minutes  30   Jani Gravel M.D on 02/12/2018 at 5:57 AM  Between 7am to 7pm - Pager - [phone]   After 7pm go to [email or web address] - password [facility]  [facility] -  Office  [phone]

## 2018-02-12 NOTE — Discharge Instructions (Signed)
Appendicitis The appendix is a tube that is shaped like a finger. It is connected to the large intestine. Appendicitis means that this tube is swollen (inflamed). Without treatment, the tube can tear (rupture). This can lead to a life-threatening infection. It can also cause you to have sores (abscesses). These sores hurt. What are the causes? This condition may be caused by something that blocks the appendix, such as:  A ball of poop (stool).  Lymph glands that are bigger than normal.  Sometimes, the cause is not known. What are the signs or symptoms? Symptoms of this condition include:  Pain around the belly button (navel). ? The pain moves toward the lower right belly (abdomen). ? The pain can get worse with time. ? The pain can get worse if you cough. ? The pain can get worse if you move suddenly.  Tenderness in the lower right belly.  Feeling sick to your stomach (nauseous).  Throwing up (vomiting).  Not feeling hungry (loss of appetite).  A fever.  Having a hard time pooping (constipation).  Watery poop (diarrhea).  Not feeling well.  How is this treated? Usually, this condition is treated by taking out the appendix (appendectomy). There are two ways that the appendix can be taken out:  Open surgery. In this surgery, the appendix is taken out through a large cut (incision). The cut is made in the lower right belly. This surgery may be picked if: ? You have scars from another surgery. ? You have a bleeding condition. ? You are pregnant and will be having your baby soon. ? You have a condition that does not allow the other type of surgery.  Laparoscopic surgery. In this surgery, the appendix is taken out through small cuts. Often, this surgery: ? Causes less pain. ? Causes fewer problems. ? Is easier to heal from.  If your appendix tears and a sore forms:  A drain may be put into the sore. The drain will be used to get rid of fluid.  You may get an antibiotic  medicine through an IV tube.  Your appendix may or may not need to be taken out.  This information is not intended to replace advice given to you by your health care provider. Make sure you discuss any questions you have with your health care provider. Document Released: 10/01/2011 Document Revised: 12/15/2015 Document Reviewed: 11/24/2014 Elsevier Interactive Patient Education  [employer].

## 2018-02-13 ENCOUNTER — Telehealth: Payer: Self-pay | Admitting: Family Medicine

## 2018-02-13 NOTE — Telephone Encounter (Signed)
[place] for [employer], Aug 7th at [facility]

## 2018-02-13 NOTE — Telephone Encounter (Signed)
Routing to PCP for review of schedule. Looks like she was evaluated for acute appendicitis.

## 2018-02-13 NOTE — Telephone Encounter (Signed)
Pt called. She needs a hospital f/u in the next two weeks and Dr Madilyn Fireman has nothing available. Please advise.

## 2018-02-14 NOTE — Telephone Encounter (Signed)
Thank you. Appointment has been scheduled.

## 2018-02-14 NOTE — Telephone Encounter (Signed)
Routing to scheduler

## 2018-02-20 ENCOUNTER — Other Ambulatory Visit: Payer: Self-pay | Admitting: Family Medicine

## 2018-02-20 DIAGNOSIS — C3490 Malignant neoplasm of unspecified part of unspecified bronchus or lung: Secondary | ICD-10-CM

## 2018-02-20 DIAGNOSIS — R5382 Chronic fatigue, unspecified: Secondary | ICD-10-CM

## 2018-02-20 DIAGNOSIS — R110 Nausea: Secondary | ICD-10-CM

## 2018-02-21 ENCOUNTER — Other Ambulatory Visit: Payer: Self-pay

## 2018-02-21 MED ORDER — DRONABINOL 5 MG PO CAPS: 5.0000 mg | ORAL_CAPSULE | Freq: Two times a day (BID) | ORAL | 0 refills | Status: DC

## 2018-02-21 NOTE — Telephone Encounter (Signed)
Copy of RX printed in error- this has been shredded.  Electronic refill uploaded for Dr Madilyn Fireman to review and [facility]   Thanks!

## 2018-02-24 ENCOUNTER — Other Ambulatory Visit: Payer: Self-pay | Admitting: Family Medicine

## 2018-02-24 ENCOUNTER — Other Ambulatory Visit: Payer: Self-pay

## 2018-02-24 DIAGNOSIS — M5136 Other intervertebral disc degeneration, lumbar region: Secondary | ICD-10-CM

## 2018-02-24 DIAGNOSIS — G894 Chronic pain syndrome: Secondary | ICD-10-CM

## 2018-02-24 MED ORDER — HYDROCODONE-ACETAMINOPHEN 10-325 MG PO TABS: 1.0000 | ORAL_TABLET | Freq: Four times a day (QID) | ORAL | 0 refills | Status: DC | PRN

## 2018-02-24 MED ORDER — DRONABINOL 5 MG PO CAPS: 5.0000 mg | ORAL_CAPSULE | Freq: Two times a day (BID) | ORAL | 0 refills | Status: DC

## 2018-02-24 NOTE — Telephone Encounter (Signed)
Med refilled.

## 2018-02-24 NOTE — Telephone Encounter (Signed)
Pt requesting RF on her Norco be sent to [facility].   Last RX sent 02-12-18 for #10.    RX pended, please review and send if appropriate.   Thanks!

## 2018-02-25 ENCOUNTER — Telehealth: Payer: Self-pay | Admitting: *Deleted

## 2018-02-25 NOTE — Telephone Encounter (Signed)
Called and lvm asking pt's sister to rtn call regarding FMLA form because we need to know if she wants it for the time she (Mrs. Passey) was in the hospital or if longer, or if it was for intermitted leave.   Asked that she call back to clarify this so that we can complete her forms.Tamara Adams [place]

## 2018-02-25 NOTE — Telephone Encounter (Signed)
Pt advised.

## 2018-02-26 ENCOUNTER — Ambulatory Visit (INDEPENDENT_AMBULATORY_CARE_PROVIDER_SITE_OTHER): Payer: Medicare Other | Admitting: Family Medicine

## 2018-02-26 ENCOUNTER — Encounter: Payer: Self-pay | Admitting: Family Medicine

## 2018-02-26 VITALS — BP 138/64 | HR 77 | Wt 96.0 lb

## 2018-02-26 DIAGNOSIS — K3589 Other acute appendicitis without perforation or gangrene: Secondary | ICD-10-CM

## 2018-02-26 DIAGNOSIS — I10 Essential (primary) hypertension: Secondary | ICD-10-CM | POA: Diagnosis not present

## 2018-02-26 DIAGNOSIS — R110 Nausea: Secondary | ICD-10-CM | POA: Diagnosis not present

## 2018-02-26 DIAGNOSIS — E538 Deficiency of other specified B group vitamins: Secondary | ICD-10-CM | POA: Insufficient documentation

## 2018-02-26 DIAGNOSIS — I2510 Atherosclerotic heart disease of native coronary artery without angina pectoris: Secondary | ICD-10-CM | POA: Diagnosis not present

## 2018-02-26 MED ORDER — CYANOCOBALAMIN 1000 MCG/ML IJ SOLN
1000.0000 ug | Freq: Once | INTRAMUSCULAR | Status: AC
  Administered 2018-02-26: 1000 ug via INTRAMUSCULAR

## 2018-02-26 MED ORDER — DRONABINOL 2.5 MG PO CAPS: 2.5000 mg | ORAL_CAPSULE | Freq: Two times a day (BID) | ORAL | 0 refills | Status: DC

## 2018-02-26 MED ORDER — CLOPIDOGREL BISULFATE 75 MG PO TABS: ORAL_TABLET | ORAL | 1 refills | Status: AC

## 2018-02-26 MED ORDER — HYDRALAZINE HCL 25 MG PO TABS: 75.0000 mg | ORAL_TABLET | Freq: Two times a day (BID) | ORAL | 1 refills | Status: DC

## 2018-02-26 MED ORDER — LIDOCAINE HCL 2 % EX LIQD: CUTANEOUS | 1 refills | Status: AC

## 2018-02-26 NOTE — Progress Notes (Signed)
Subjective:    Patient ID: Tamara Adams, female    DOB: December 29, 1940, 77 y.o.   MRN: [id number]  HPI [name] is a 77 year old female is here today to follow-up for recent hospitalization at [facility] in [place].  She was admitted on July 18 and discharged home on July 24 for acute appendicitis.  She opted to not have surgery and to be treated with antibiotic therapy and followed with serial abdominal exams.  As she started to improve she was then discharged home.  At discharge she was tolerating p.o. well and ambulating well and her pain was well controlled.  He did have a palliative care consult while they are to set goals of care.  Did review her hospital and discharge notes.   Stopped taking her causing 3 times a day.  She is now just taking it twice a day and not taking her last dose of the day because her diastolic was dropping into the 30s.  We will need refills if she is going to stay on it.  History of MI with coronary artery disease-she says on her discharge summary they did not recommend that she restart her Plavix but she did so when she got home wanted to make sure that it was safe for her to continue so.  Per the notes from the hospitalization it looks like it was just held temporarily in case she ended up needing surgery or drain placed.  Wanted to let me know about some episodes that she is been having for about the last 2 to 3 months.  She says particularly during the daytime she will just get this feeling like she is going to pass out.  It can last anywhere between 5 to 10 minutes.  This been going on for about 2 to 3 months and is now happening a couple times a day.  She is checked her blood pressure, her blood glucose and her oxygen levels and they all seem to be stable when this happens she has not been able to detect any specific pattern to it.  Can occur with sitting.  In fact most of the time it happens she is sitting and watching TV.   Review of  Systems  BP 138/64   Pulse 77   Wt 96 lb (43.5 kg)   SpO2 98% Comment: 6L  BMI 16.74 kg/m     Allergies  Allergen Reactions  . Tyloxapol Itching  . Maxidex [Dexamethasone] Other (See Comments)    DEHYDRATION AND HEART RACING  . Advair Diskus [Fluticasone-Salmeterol] Other (See Comments)    No benefit with lungs (INEFFECTIVE)  . Remeron [Mirtazapine] Other (See Comments)    CAUSED NIGHTMARES  . Trazodone And Nefazodone Other (See Comments)    Heart pounding  . Tylox [Oxycodone-Acetaminophen] Itching    Past Medical History:  Diagnosis Date  . Anemia    as a child  . Arthritis    "hands" (12/24/2014)  . Complication of anesthesia    " My blood gas dropped during surgery so I was left on a ventilator and in ICU for 3 days."  . COPD (chronic obstructive pulmonary disease) (HCC)    FVC 72%, FEV1 29%, FEV1 ratio 32% (very severe (COPD)  . COPD (chronic obstructive pulmonary disease) ([place])   . Degenerative disc disease   . Depression with anxiety   . Diverticulosis   . DVT (deep venous thrombosis) ([place])    "LLE; years ago after a surgery"  . Essential  hypertension   . Fluttering sensation of heart    pt put on metoprolol as a result  . GERD (gastroesophageal reflux disease)    PMH  . H/O hiatal hernia   . Headache    "maybe weekly" (12/24/2014)  . Hyperlipidemia   . Hypertension   . Kidney stone   . Liver lesion   . MI (myocardial infarction) ([place]) 07/19/2017  . On home oxygen therapy    "3L; sleep w/it; use it when I rest" (12/24/2014)  . Peptic ulcer   . Pinched nerve    in back  . Pneumonia   . PONV (postoperative nausea and vomiting)   . Shortness of breath dyspnea    with exertion  . Skipped heart beats   . Small bowel obstruction ([place])    "several times; OR twice" (12/24/2014)  . Tubular adenoma of colon 09/2011  . Wears glasses     Past Surgical History:  Procedure Laterality Date  . ABDOMINAL ADHESION SURGERY  12/24/2014  . ABDOMINAL HYSTERECTOMY  1987    and one ovary  . CATARACT EXTRACTION W/ INTRAOCULAR LENS  IMPLANT, BILATERAL Right 06/22/2012   Dr. Lester Kinsman.   Marland Kitchen CATARACT EXTRACTION W/PHACO Left 06/12/2012   Dr. Boykin Reaper  . CHOLECYSTECTOMY N/A 08/18/2013   Procedure: LAPAROSCOPIC CHOLECYSTECTOMY;  Surgeon: Harl Bowie, MD;  Location: [facility];  Service: General;  Laterality: N/A;  . DILATION AND CURETTAGE OF UTERUS    . EXPLORATORY LAPAROTOMY  12/24/2014  . HERNIA REPAIR    . INCISIONAL HERNIA REPAIR  12/24/2014  . INCISIONAL HERNIA REPAIR N/A 12/24/2014   Procedure: HERNIA REPAIR INCISIONAL;  Surgeon: Coralie Keens, MD;  Location: [place];  Service: General;  Laterality: N/A;  . LAPAROTOMY N/A 12/24/2014   Procedure: EXPLORATORY LAPAROTOMY;  Surgeon: Coralie Keens, MD;  Location: [place];  Service: General;  Laterality: N/A;  . LYSIS OF ADHESION N/A 12/24/2014   Procedure: LYSIS OF ADHESION;  Surgeon: Coralie Keens, MD;  Location: [facility];  Service: General;  Laterality: N/A;  . MULTIPLE TOOTH EXTRACTIONS    . OOPHORECTOMY  1992  . SMALL INTESTINE SURGERY     for a blockage, no bowel was removed, just kinked from scar tissue  . TUBAL LIGATION    . URETHRAL DILATION      Social History   Socioeconomic History  . Marital status: Married    Spouse name: Not on file  . Number of children: 3  . Years of education: Not on file  . Highest education level: Not on file  Occupational History  . Occupation: retired.    Social Needs  . Financial resource strain: Not on file  . Food insecurity:    Worry: Not on file    Inability: Not on file  . Transportation needs:    Medical: Not on file    Non-medical: Not on file  Tobacco Use  . Smoking status: Former Smoker    Packs/day: 0.25    Years: 57.00    Pack years: 14.25    Types: Cigarettes    Last attempt to quit: 07/25/2015    Years since quitting: 2.5  . Smokeless tobacco: Never Used  . Tobacco comment: 5 cigarettes a day  Substance and Sexual Activity  . Alcohol use: No     Alcohol/week: 0.0 oz  . Drug use: No  . Sexual activity: Never  Lifestyle  . Physical activity:    Days per week: Not on file    Minutes per session:  Not on file  . Stress: Not on file  Relationships  . Social connections:    Talks on phone: Not on file    Gets together: Not on file    Attends religious service: Not on file    Active member of club or organization: Not on file    Attends meetings of clubs or organizations: Not on file    Relationship status: Not on file  . Intimate partner violence:    Fear of current or ex partner: Not on file    Emotionally abused: Not on file    Physically abused: Not on file    Forced sexual activity: Not on file  Other Topics Concern  . Not on file  Social History Narrative   No regular exercise.     Family History  Problem Relation Age of Onset  . Hypertension Mother   . Ovarian cancer Mother   . Glaucoma Mother   . Glaucoma Sister   . Pancreatic cancer Maternal Uncle   . Colon cancer Neg Hx   . Colon polyps Neg Hx   . Diabetes Neg Hx   . Kidney disease Neg Hx   . Gallbladder disease Neg Hx   . Esophageal cancer Neg Hx     Outpatient Encounter Medications as of 02/26/2018  Medication Sig  . acetaminophen (TYLENOL) 325 MG tablet Take 325-650 mg by mouth every 6 (six) hours as needed for headache.  . albuterol (PROVENTIL) (2.5 MG/3ML) 0.083% nebulizer solution USE 1 VIAL (3 ML) VIA NEBULIZER EVERY 6 HOURS AS NEEDED FOR WHEEZING OR SHORTNESS OF BREATH  . AMBULATORY NON FORMULARY MEDICATION Medication Name: Needs oxygen concentrator set to 6 liter per minutes as she has a long tubing on her machine.  Fax to [facility] (Patient taking differently: Medication Name: Needs oxygen concentrator set to 8 liter per minutes as she has a long tubing on her machine.  Fax to [employer])  . AMBULATORY NON FORMULARY MEDICATION Medication Name: GI cocktail: 119mL Lidocaine2%, 115ml Maalox, 70ml Donnatol. Ok to take twice daily.  Marland Kitchen amLODipine (NORVASC) 10  MG tablet Take 1 tablet (10 mg total) by mouth daily.  Marland Kitchen BREO ELLIPTA 100-25 MCG/INH AEPB USE 1 INHALATION DAILY  . clopidogrel (PLAVIX) 75 MG tablet TAKE 1 TABLET DAILY  . docusate sodium (COLACE) 100 MG capsule Take 1 capsule (100 mg total) by mouth daily.  Marland Kitchen dronabinol (MARINOL) 2.5 MG capsule Take 1 capsule (2.5 mg total) by mouth 2 (two) times daily before lunch and supper.  . feeding supplement (BOOST / RESOURCE BREEZE) LIQD Take 1 Container by mouth 2 (two) times daily between meals. Chocolate boost  . guaiFENesin (MUCINEX) 600 MG 12 hr tablet Take 1 tablet (600 mg total) by mouth 2 (two) times daily.  . hydrALAZINE (APRESOLINE) 25 MG tablet Take 3 tablets (75 mg total) by mouth 2 (two) times daily.  Marland Kitchen HYDROcodone-acetaminophen (NORCO) 10-325 MG tablet Take 1 tablet by mouth every 6 (six) hours as needed.  . INCRUSE ELLIPTA 62.5 MCG/INH AEPB USE 1 INHALATION DAILY  . losartan (COZAAR) 100 MG tablet Take 1 tablet (100 mg total) by mouth daily.  . metoprolol tartrate (LOPRESSOR) 50 MG tablet Take 1 tablet (50 mg total) by mouth 2 (two) times daily.  . Multiple Vitamin (MULTIVITAMIN WITH MINERALS) TABS tablet Take 1 tablet by mouth daily. Pt uses One-A-Day brand  . Nutritional Supplements (FEEDING SUPPLEMENT, BOOST BREEZE,) LIQD Take 1 Can by mouth 3 (three) times daily.  Marland Kitchen omeprazole (PRILOSEC) 40 MG  capsule Take 1 capsule (40 mg total) by mouth 2 (two) times daily.  . ondansetron (ZOFRAN-ODT) 8 MG disintegrating tablet PLACE 1 TABLET ON THE TONGUE EVERY 8 HOURS AS NEEDED FOR NAUSEA  . polyethylene glycol (MIRALAX / GLYCOLAX) packet Take 17 g by mouth daily.  . predniSONE (DELTASONE) 10 MG tablet TAKE 1 TABLET DAILY WITH BREAKFAST  . promethazine (PHENERGAN) 25 MG tablet Take 1 tablet (25 mg total) by mouth every 8 (eight) hours as needed for nausea or vomiting.  . ranitidine (ZANTAC) 150 MG tablet TAKE 1 TABLET DAILY  . sucralfate (CARAFATE) 1 GM/10ML suspension Take 10 mLs (1 g total) by  mouth 4 (four) times daily -  with meals and at bedtime.  . traMADol (ULTRAM) 50 MG tablet Take 1 tablet (50 mg total) by mouth every 6 (six) hours as needed (pain).  . [DISCONTINUED] clopidogrel (PLAVIX) 75 MG tablet TAKE 1 TABLET DAILY  . [DISCONTINUED] dronabinol (MARINOL) 5 MG capsule Take 1 capsule (5 mg total) by mouth 2 (two) times daily.  . [DISCONTINUED] hydrALAZINE (APRESOLINE) 25 MG tablet Take 3 tablets (75 mg total) by mouth every 8 (eight) hours.  . [DISCONTINUED] lidocaine (XYLOCAINE) 2 % solution Use as directed 20 mLs in the mouth or throat 2 (two) times daily as needed. (Patient taking differently: Use as directed 20 mLs in the mouth or throat 2 (two) times daily as needed for mouth pain. )  . Lidocaine HCl 2 % LIQD 20 mL Mouth/Throat swish and spit 2 times daily PRN   No facility-administered encounter medications on file as of 02/26/2018.          Objective:   Physical Exam  Constitutional: She is oriented to person, place, and time. She appears well-developed and well-nourished.  Sitting in wheelchair with oxygen on.   HENT:  Head: Normocephalic and atraumatic.  Cardiovascular: Normal rate, regular rhythm and normal heart sounds.  Pulmonary/Chest: Effort normal and breath sounds normal.  Neurological: She is alert and oriented to person, place, and time.  Skin: Skin is warm and dry.  Psychiatric: She has a normal mood and affect. Her behavior is normal.       Assessment & Plan:  Acute appendicitis- she is much better and back to baseline.  Discontinue monitor for recurrent symptoms.  Explained that she is always at risk for recurrence.  She does have a follow-up with Dr. Ninfa Linden the general surgeon in the next couple weeks.  Severe COPD with chronic respiratory failure on 8 L of oxygen-continue current regimen.  Hypertension -blood pressure looks great on twice daily hydralazine.  3 times daily dosing was causing very low diastolics.  She has low p.o. intake as  well as fluid intake so we will just monitor carefully.  New prescription sent to pharmacy.  History CAD/S/P non sT elevation MI -I am glad she went ahead and restarted her Plavix.  Continue plavix.  Doing new prescription sent to mail order.  b12 deficiency - B12 injection given today.   Near syncope-unclear etiology.  Less likely to be cardiac since she was actually on a cardiac monitor for almost a week in the hospital and they did not detect any arrhythmias.  It does not sound consistent with stroke says is been going on for quite some time and its repetitive.  She checked her blood pressure blood, glucose and oxygen level when it happens so is less likely to be a fluctuation in these medical conditions.  The only thing that we did  change a few months ago was we increased her dose on her dronabinol from 2.5 mg to 5 mg.  We will try going back down to 2.5 mg 4 months of prescription was sent to local pharmacy so that we could see if this makes a difference or not.  TIme spent 40 min, 50% of time spent counseling about appendicitis, severe COPD, hypertension, coronary artery disease, B12 deficiency and near syncope.

## 2018-02-26 NOTE — Progress Notes (Signed)
Pt was here for her hospital f/u she was due for her monthly B12 injection.  B 12 injection given today. Pt tolerated well, given in LD. She will RTC in 1 month for next injection.Tamara Adams, [place]

## 2018-02-28 ENCOUNTER — Other Ambulatory Visit: Payer: Self-pay | Admitting: *Deleted

## 2018-02-28 DIAGNOSIS — R109 Unspecified abdominal pain: Secondary | ICD-10-CM | POA: Diagnosis not present

## 2018-02-28 DIAGNOSIS — I10 Essential (primary) hypertension: Secondary | ICD-10-CM | POA: Diagnosis not present

## 2018-02-28 DIAGNOSIS — E43 Unspecified severe protein-calorie malnutrition: Secondary | ICD-10-CM | POA: Diagnosis not present

## 2018-02-28 DIAGNOSIS — K529 Noninfective gastroenteritis and colitis, unspecified: Secondary | ICD-10-CM | POA: Diagnosis not present

## 2018-02-28 DIAGNOSIS — K449 Diaphragmatic hernia without obstruction or gangrene: Secondary | ICD-10-CM | POA: Diagnosis not present

## 2018-02-28 DIAGNOSIS — K388 Other specified diseases of appendix: Secondary | ICD-10-CM | POA: Diagnosis not present

## 2018-02-28 DIAGNOSIS — R1084 Generalized abdominal pain: Secondary | ICD-10-CM | POA: Diagnosis not present

## 2018-02-28 DIAGNOSIS — Z66 Do not resuscitate: Secondary | ICD-10-CM | POA: Diagnosis not present

## 2018-02-28 DIAGNOSIS — J9611 Chronic respiratory failure with hypoxia: Secondary | ICD-10-CM | POA: Diagnosis not present

## 2018-02-28 DIAGNOSIS — R110 Nausea: Secondary | ICD-10-CM | POA: Diagnosis not present

## 2018-02-28 DIAGNOSIS — K3580 Unspecified acute appendicitis: Secondary | ICD-10-CM | POA: Diagnosis not present

## 2018-02-28 DIAGNOSIS — Z681 Body mass index (BMI) 19 or less, adult: Secondary | ICD-10-CM | POA: Diagnosis not present

## 2018-02-28 MED ORDER — LOSARTAN POTASSIUM 50 MG PO TABS: 50.0000 mg | ORAL_TABLET | Freq: Every day | ORAL | 1 refills | Status: AC

## 2018-03-01 DIAGNOSIS — I10 Essential (primary) hypertension: Secondary | ICD-10-CM | POA: Diagnosis not present

## 2018-03-01 DIAGNOSIS — K3580 Unspecified acute appendicitis: Secondary | ICD-10-CM | POA: Diagnosis not present

## 2018-03-01 DIAGNOSIS — J9611 Chronic respiratory failure with hypoxia: Secondary | ICD-10-CM | POA: Diagnosis not present

## 2018-03-01 DIAGNOSIS — J9621 Acute and chronic respiratory failure with hypoxia: Secondary | ICD-10-CM | POA: Diagnosis not present

## 2018-03-01 DIAGNOSIS — E43 Unspecified severe protein-calorie malnutrition: Secondary | ICD-10-CM | POA: Diagnosis not present

## 2018-03-01 DIAGNOSIS — K529 Noninfective gastroenteritis and colitis, unspecified: Secondary | ICD-10-CM | POA: Diagnosis not present

## 2018-03-02 DIAGNOSIS — J9611 Chronic respiratory failure with hypoxia: Secondary | ICD-10-CM | POA: Diagnosis not present

## 2018-03-02 DIAGNOSIS — I252 Old myocardial infarction: Secondary | ICD-10-CM | POA: Diagnosis not present

## 2018-03-02 DIAGNOSIS — R911 Solitary pulmonary nodule: Secondary | ICD-10-CM | POA: Diagnosis present

## 2018-03-02 DIAGNOSIS — C3492 Malignant neoplasm of unspecified part of left bronchus or lung: Secondary | ICD-10-CM | POA: Diagnosis present

## 2018-03-02 DIAGNOSIS — Z66 Do not resuscitate: Secondary | ICD-10-CM | POA: Diagnosis present

## 2018-03-02 DIAGNOSIS — Z681 Body mass index (BMI) 19 or less, adult: Secondary | ICD-10-CM | POA: Diagnosis not present

## 2018-03-02 DIAGNOSIS — I10 Essential (primary) hypertension: Secondary | ICD-10-CM | POA: Diagnosis present

## 2018-03-02 DIAGNOSIS — Z87891 Personal history of nicotine dependence: Secondary | ICD-10-CM | POA: Diagnosis not present

## 2018-03-02 DIAGNOSIS — I2510 Atherosclerotic heart disease of native coronary artery without angina pectoris: Secondary | ICD-10-CM | POA: Diagnosis present

## 2018-03-02 DIAGNOSIS — J439 Emphysema, unspecified: Secondary | ICD-10-CM | POA: Diagnosis present

## 2018-03-02 DIAGNOSIS — K5900 Constipation, unspecified: Secondary | ICD-10-CM | POA: Diagnosis present

## 2018-03-02 DIAGNOSIS — Z79899 Other long term (current) drug therapy: Secondary | ICD-10-CM | POA: Diagnosis not present

## 2018-03-02 DIAGNOSIS — Z7982 Long term (current) use of aspirin: Secondary | ICD-10-CM | POA: Diagnosis not present

## 2018-03-02 DIAGNOSIS — K529 Noninfective gastroenteritis and colitis, unspecified: Secondary | ICD-10-CM | POA: Diagnosis present

## 2018-03-02 DIAGNOSIS — Z9981 Dependence on supplemental oxygen: Secondary | ICD-10-CM | POA: Diagnosis not present

## 2018-03-02 DIAGNOSIS — R109 Unspecified abdominal pain: Secondary | ICD-10-CM | POA: Diagnosis not present

## 2018-03-02 DIAGNOSIS — Z86718 Personal history of other venous thrombosis and embolism: Secondary | ICD-10-CM | POA: Diagnosis not present

## 2018-03-02 DIAGNOSIS — J9621 Acute and chronic respiratory failure with hypoxia: Secondary | ICD-10-CM | POA: Diagnosis not present

## 2018-03-02 DIAGNOSIS — K3580 Unspecified acute appendicitis: Secondary | ICD-10-CM | POA: Diagnosis present

## 2018-03-02 DIAGNOSIS — E43 Unspecified severe protein-calorie malnutrition: Secondary | ICD-10-CM | POA: Diagnosis present

## 2018-03-02 DIAGNOSIS — Z888 Allergy status to other drugs, medicaments and biological substances status: Secondary | ICD-10-CM | POA: Diagnosis not present

## 2018-03-02 DIAGNOSIS — I480 Paroxysmal atrial fibrillation: Secondary | ICD-10-CM | POA: Diagnosis present

## 2018-03-09 ENCOUNTER — Other Ambulatory Visit: Payer: Self-pay | Admitting: Family Medicine

## 2018-03-25 ENCOUNTER — Telehealth: Payer: Self-pay

## 2018-03-25 MED ORDER — DRONABINOL 2.5 MG PO CAPS: 2.5000 mg | ORAL_CAPSULE | Freq: Two times a day (BID) | ORAL | 0 refills | Status: DC

## 2018-03-25 NOTE — Telephone Encounter (Signed)
Tamara Adams wants a 90 day refill on Marinol sent to [facility]. Is this appropriate?

## 2018-03-25 NOTE — Telephone Encounter (Signed)
Med sent.

## 2018-03-31 ENCOUNTER — Other Ambulatory Visit: Payer: Self-pay

## 2018-03-31 ENCOUNTER — Other Ambulatory Visit: Payer: Self-pay | Admitting: *Deleted

## 2018-03-31 DIAGNOSIS — G894 Chronic pain syndrome: Secondary | ICD-10-CM

## 2018-03-31 DIAGNOSIS — M5136 Other intervertebral disc degeneration, lumbar region: Secondary | ICD-10-CM

## 2018-03-31 MED ORDER — HYDROCODONE-ACETAMINOPHEN 10-325 MG PO TABS: 1.0000 | ORAL_TABLET | Freq: Four times a day (QID) | ORAL | 0 refills | Status: DC | PRN

## 2018-03-31 MED ORDER — TRAMADOL HCL 50 MG PO TABS: 50.0000 mg | ORAL_TABLET | Freq: Four times a day (QID) | ORAL | 1 refills | Status: AC | PRN

## 2018-03-31 MED ORDER — HYDRALAZINE HCL 25 MG PO TABS: 75.0000 mg | ORAL_TABLET | Freq: Two times a day (BID) | ORAL | 1 refills | Status: DC

## 2018-03-31 MED ORDER — DRONABINOL 2.5 MG PO CAPS: 2.5000 mg | ORAL_CAPSULE | Freq: Two times a day (BID) | ORAL | 0 refills | Status: DC

## 2018-03-31 MED ORDER — ONDANSETRON 8 MG PO TBDP: ORAL_TABLET | ORAL | 3 refills | Status: AC

## 2018-03-31 MED ORDER — PROMETHAZINE HCL 25 MG PO TABS: 25.0000 mg | ORAL_TABLET | Freq: Three times a day (TID) | ORAL | 3 refills | Status: DC | PRN

## 2018-03-31 NOTE — Telephone Encounter (Signed)
Chyna requests a refill on Hydrocodone and dronabinol. She needs a 30 day of the dronabinol to the local pharmacy.

## 2018-03-31 NOTE — Telephone Encounter (Signed)
Send over the prescription for the hydrocodone.  But I also sent the dronabinol to her mail order about a week ago.  If she is afraid she is going to run short then I would prefer to just send a 10-day supply to the local pharmacy versus a 30-day.  Because and that gets a soft track with more trying to fill her medication.

## 2018-04-01 MED ORDER — DRONABINOL 2.5 MG PO CAPS: 2.5000 mg | ORAL_CAPSULE | Freq: Two times a day (BID) | ORAL | 0 refills | Status: AC

## 2018-04-01 NOTE — Telephone Encounter (Signed)
It was printed by mistake. Updated prescription. Voided old prescription.

## 2018-04-03 ENCOUNTER — Other Ambulatory Visit: Payer: Self-pay

## 2018-04-03 NOTE — Telephone Encounter (Signed)
Pt's sister called requesting an update on this refill. I advised her of Dr Gardiner Ramus note. Told pt's sister that they need to have a pharmacy who has filled this before send [place] a fax with this RX information.   Sister states she will contact pt and have this taken care of.

## 2018-04-03 NOTE — Telephone Encounter (Signed)
Is not on her med list then we need to figure out how the prescription needs to be written exactly.  If she is been previously feeling this at [facility] may be they could actually just send [place] a refill request and that I can approve it or we can get the details from them and we can enter it manually and send it.

## 2018-04-03 NOTE — Telephone Encounter (Signed)
Pt called requesting a refill be sent for her Phenobarbitol to [facility].  Pt last got this from [facility] and it was written by Blanca Friend, from GI office. Pt has not seen GI in a while and states she received this medication in liquid form to make a GI cocktail. Per pt, she has discussed this with Dr Madilyn Fireman before and she was ok to refill for pt.   Please advise

## 2018-04-04 ENCOUNTER — Telehealth: Payer: Self-pay | Admitting: Family Medicine

## 2018-04-04 MED ORDER — LIDOCAINE VISCOUS HCL 2 % MT SOLN: OROMUCOSAL | 5 refills | Status: DC

## 2018-04-04 MED ORDER — AMBULATORY NON FORMULARY MEDICATION: 3 refills | Status: DC

## 2018-04-04 MED ORDER — PB-HYOSCY-ATROPINE-SCOPOLAMINE 16.2 MG/5ML PO ELIX: ORAL_SOLUTION | ORAL | 5 refills | Status: DC

## 2018-04-04 MED ORDER — AMBULATORY NON FORMULARY MEDICATION: 0 refills | Status: AC

## 2018-04-04 NOTE — Telephone Encounter (Signed)
That sounds perfect. Thank you for doing the leg work on this one.

## 2018-04-04 NOTE — Telephone Encounter (Signed)
Pt called back: pharmacist at [facility] is giving patient a 4 ounce bottle of Phenobarbital/Hyoscy/atropine/scop 5 ML QID.  Pt states she tried to contact [facility] to have them send [place] a fax of the last RX she received and she states that she is not able to contact them.

## 2018-04-04 NOTE — Telephone Encounter (Signed)
No problem!!   Pt advised. No further needs at this time.

## 2018-04-04 NOTE — Telephone Encounter (Signed)
Pharmacy called.  Compounding the GI cocktail is going to be cash price over $100 and would actually only be a 14-day supply.  So after discussing with the pharmacist will switch to individual products and patient can mix at home.  So we will send over prescription for viscous lidocaine 2% with the Donnatal.  She can mix Maalox from home they will give her instructions on how to do this on her own.

## 2018-04-04 NOTE — Telephone Encounter (Signed)
[facility] (30 minute conversation) and finally was able to speak with pharmacist Jeanine. Per Ashby Dawes, all she has on file is the RX for GI cocktail that was written by Dr Madilyn Fireman on 08-14-17, but never filled by pt. I feel that patient was asking for a refill on phenobarbital because she has been mixing GI cocktail herself.   Dr Madilyn Fireman, are you OK to RF the GI cocktail for pt and send it to [facility] so they can fill for pt since she is completely out and no longer sees GI?   I have pended this RX, please review and see if appropriate. Thanks.

## 2018-04-07 ENCOUNTER — Other Ambulatory Visit: Payer: Self-pay | Admitting: Family Medicine

## 2018-04-07 MED FILL — PHENOBARBITAL-BELLADONNA AL: 16.2 | 10 days supply | Qty: 120 | Fill #0

## 2018-04-08 ENCOUNTER — Telehealth: Payer: Self-pay

## 2018-04-08 MED ORDER — AZITHROMYCIN 250 MG PO TABS: ORAL_TABLET | ORAL | 0 refills | Status: AC

## 2018-04-08 NOTE — Telephone Encounter (Signed)
Pt called- for 2 days she has had a lot of chest congestion. Coughing up thick white phlegm. Has some shortness of breath but states that's usual for her.   Offered pt appt but she states she does not have any way to get to the office until Thursday. Pt wanting to know if she could could have dose of antibiotics to hold her over until she could make an appt.   Please advise

## 2018-04-08 NOTE — Telephone Encounter (Signed)
Pt advised. She has been added to Dr Gardiner Ramus schedule on Thursday

## 2018-04-08 NOTE — Telephone Encounter (Signed)
Options sent for azithromycin to [facility].  To get on the schedule later this week if at all possible.

## 2018-04-10 ENCOUNTER — Ambulatory Visit: Payer: Medicare Other | Admitting: Family Medicine

## 2018-04-11 DIAGNOSIS — D638 Anemia in other chronic diseases classified elsewhere: Secondary | ICD-10-CM | POA: Insufficient documentation

## 2018-04-14 DIAGNOSIS — Z7189 Other specified counseling: Secondary | ICD-10-CM | POA: Insufficient documentation

## 2018-04-14 DIAGNOSIS — I82409 Acute embolism and thrombosis of unspecified deep veins of unspecified lower extremity: Secondary | ICD-10-CM | POA: Insufficient documentation

## 2018-04-14 NOTE — Assessment & Plan Note (Addendum)
Patient has a remote history of lower extremity DVT after surgery several decades ago.  However, I was unable to find records to confirm this history. Doppler of right lower extremity in 2017 was negative for DVT. Patient is currently on Plavix for antiplatelet therapy for the history of coronary artery disease. No indication for consideration of anticoagulation at this time.

## 2018-04-14 NOTE — Assessment & Plan Note (Addendum)
PET in June 2019 showed stable left upper lobe nodule with a rim of hypermetabolic thickening peripheral to the lesion, most consistent with radiation changes.  In addition, there was a new hypermetabolic right middle lobe nodule with groundglass pattern, favoring infectious or inflammatory etiology. Patient was evaluated by radiation oncology in July 2019, and no further treatment was recommended at that time.  See details below regarding the goals of care. Briefly, I discussed with the patient that even if the lung cancer recurred, her multiple comorbidities and overall frailty would preclude her from receiving chemotherapy, as the harms of chemotherapy would likely outweigh the benefits. Furthermore, given the patient's wish to be evaluated by hospice care, we will hold off surveillance imaging at this time, pending the patient discussion with hospice service.

## 2018-04-14 NOTE — Assessment & Plan Note (Addendum)
During patient's hospitalization in July 2019 for acute appendicitis, palliative care was consulted due to the patient's multiple comorbidities and the risk of respiratory failure if she had undergone surgery. Her CODE STATUS was changed to DNR/DNI at that time. She was again hospitalized in August 2019 at [facility] for gastroenteritis/enterocolitis.  No intervention was performed, such as endoscopy, due to the risk of respiratory failure.  I reaffirmed with the patient today that her CODE STATUS remains DNR/DNI. The patient expressed her wish to be comfortable, and not to undergo any heroic measure or invasive procedure.  Furthermore, she understood that her multiple comorbidities, including severe COPD, are not reversible, and therefore she wants to focus on symptom control.  I discussed with the patient about some of the benefits and resources that hospice service can offer, and the patient is interested in meeting with hospice physician to further discuss her goals of care and the possibility of enrolling in hospice. The patient indicated that some of her children have some different opinion regarding goals of care, and I strongly encouraged her to bring her children to the next visit with hospice physician so that they can all be on the same page.

## 2018-04-14 NOTE — Assessment & Plan Note (Addendum)
Currently on 6-8 L/min O2 at home.  Patient has not seen her pulmonologist in several years. The patient indicated that over the past several month, her dyspnea has continued to worsen. Given the severity of her COPD, I discussed the option of hospice with the patient, and she was agreeable to a referral for hospice evaluation.

## 2018-04-14 NOTE — Assessment & Plan Note (Addendum)
Patient was recently hospitalized in July 2018 for acute appendicitis. Given her multiple comorbidities, including severe COPD on a liter oxygen, the decision was made to pursue conservative therapy with IV antibiotics. Patient was discharged home with p.o. antibiotics and is currently on Cipro/Flagyl.

## 2018-04-14 NOTE — Assessment & Plan Note (Signed)
Given that the subcentimeter hepatic lesions are consistent with cysts and have been stable in size for over 2 years, there is no indication for routine imaging surveillance.

## 2018-04-14 NOTE — Assessment & Plan Note (Addendum)
At this time, patient's hemoglobin has remained relatively stable around 10 since 2019. Her iron profile is most consistent with anemia of chronic disease. Given the borderline elevated MCV and hx of B12 deficiency, we will check B12 and folate to rule out other nutritional deficiencies. The patient last underwent EGD and colonoscopy in 2013, and biopsies showed gastric mucosa with reactive changes and fragments of tubular adenoma from the ascending colon. See the discussion regarding goals of care below.

## 2018-04-15 ENCOUNTER — Telehealth: Payer: Self-pay | Admitting: *Deleted

## 2018-04-15 ENCOUNTER — Inpatient Hospital Stay: Payer: Medicare Other

## 2018-04-15 ENCOUNTER — Encounter: Payer: Self-pay | Admitting: Hematology

## 2018-04-15 ENCOUNTER — Other Ambulatory Visit: Payer: Self-pay

## 2018-04-15 ENCOUNTER — Other Ambulatory Visit: Payer: Self-pay | Admitting: Hematology

## 2018-04-15 ENCOUNTER — Inpatient Hospital Stay: Payer: Medicare Other | Attending: Family | Admitting: Hematology

## 2018-04-15 VITALS — BP 148/56 | HR 64 | Temp 97.9°F | Resp 17 | Wt 100.8 lb

## 2018-04-15 DIAGNOSIS — K3580 Unspecified acute appendicitis: Secondary | ICD-10-CM

## 2018-04-15 DIAGNOSIS — R918 Other nonspecific abnormal finding of lung field: Secondary | ICD-10-CM

## 2018-04-15 DIAGNOSIS — J449 Chronic obstructive pulmonary disease, unspecified: Secondary | ICD-10-CM | POA: Diagnosis not present

## 2018-04-15 DIAGNOSIS — K769 Liver disease, unspecified: Secondary | ICD-10-CM

## 2018-04-15 DIAGNOSIS — R110 Nausea: Secondary | ICD-10-CM

## 2018-04-15 DIAGNOSIS — D509 Iron deficiency anemia, unspecified: Secondary | ICD-10-CM | POA: Insufficient documentation

## 2018-04-15 DIAGNOSIS — Z66 Do not resuscitate: Secondary | ICD-10-CM

## 2018-04-15 DIAGNOSIS — Z79899 Other long term (current) drug therapy: Secondary | ICD-10-CM | POA: Insufficient documentation

## 2018-04-15 DIAGNOSIS — E538 Deficiency of other specified B group vitamins: Secondary | ICD-10-CM

## 2018-04-15 DIAGNOSIS — Z7189 Other specified counseling: Secondary | ICD-10-CM

## 2018-04-15 DIAGNOSIS — D638 Anemia in other chronic diseases classified elsewhere: Secondary | ICD-10-CM

## 2018-04-15 DIAGNOSIS — D508 Other iron deficiency anemias: Secondary | ICD-10-CM

## 2018-04-15 DIAGNOSIS — I2510 Atherosclerotic heart disease of native coronary artery without angina pectoris: Secondary | ICD-10-CM

## 2018-04-15 DIAGNOSIS — C3490 Malignant neoplasm of unspecified part of unspecified bronchus or lung: Secondary | ICD-10-CM

## 2018-04-15 DIAGNOSIS — I82409 Acute embolism and thrombosis of unspecified deep veins of unspecified lower extremity: Secondary | ICD-10-CM

## 2018-04-15 DIAGNOSIS — Z86718 Personal history of other venous thrombosis and embolism: Secondary | ICD-10-CM | POA: Diagnosis not present

## 2018-04-15 DIAGNOSIS — R0602 Shortness of breath: Secondary | ICD-10-CM | POA: Diagnosis not present

## 2018-04-15 DIAGNOSIS — J9611 Chronic respiratory failure with hypoxia: Secondary | ICD-10-CM

## 2018-04-15 LAB — CMP (CANCER CENTER ONLY)
ALBUMIN: 3.4 g/dL — AB (ref 3.5–5.0)
ALT: 21 U/L (ref 10–47)
AST: 26 U/L (ref 11–38)
Alkaline Phosphatase: 73 U/L (ref 26–84)
Anion gap: 6 (ref 5–15)
BILIRUBIN TOTAL: 0.4 mg/dL (ref 0.2–1.6)
BUN: 8 mg/dL (ref 7–22)
CO2: 33 mmol/L (ref 18–33)
Calcium: 8.9 mg/dL (ref 8.0–10.3)
Chloride: 96 mmol/L — ABNORMAL LOW (ref 98–108)
Creatinine: 0.9 mg/dL (ref 0.60–1.20)
Glucose, Bld: 117 mg/dL (ref 73–118)
Potassium: 4.8 mmol/L — ABNORMAL HIGH (ref 3.3–4.7)
SODIUM: 135 mmol/L (ref 128–145)
TOTAL PROTEIN: 7 g/dL (ref 6.4–8.1)

## 2018-04-15 LAB — CBC WITH DIFFERENTIAL (CANCER CENTER ONLY)
BASOS ABS: 0.1 10*3/uL (ref 0.0–0.1)
BASOS PCT: 1 %
Eosinophils Absolute: 0.7 10*3/uL — ABNORMAL HIGH (ref 0.0–0.5)
Eosinophils Relative: 9 %
HEMATOCRIT: 36.1 % (ref 34.8–46.6)
Hemoglobin: 11.3 g/dL — ABNORMAL LOW (ref 11.6–15.9)
Lymphocytes Relative: 19 %
Lymphs Abs: 1.7 10*3/uL (ref 0.9–3.3)
MCH: 30.2 pg (ref 26.0–34.0)
MCHC: 31.3 g/dL — ABNORMAL LOW (ref 32.0–36.0)
MCV: 96.5 fL (ref 81.0–101.0)
Monocytes Absolute: 1 10*3/uL — ABNORMAL HIGH (ref 0.1–0.9)
Monocytes Relative: 12 %
NEUTROS ABS: 5.2 10*3/uL (ref 1.5–6.5)
NEUTROS PCT: 59 %
Platelet Count: 412 10*3/uL — ABNORMAL HIGH (ref 145–400)
RBC: 3.74 MIL/uL (ref 3.70–5.32)
RDW: 11.8 % (ref 11.1–15.7)
WBC Count: 8.7 10*3/uL (ref 3.9–10.0)

## 2018-04-15 LAB — VITAMIN B12: VITAMIN B 12: 287 pg/mL (ref 180–914)

## 2018-04-15 LAB — FOLATE: Folate: 6.1 ng/mL (ref >5.9)

## 2018-04-15 NOTE — Progress Notes (Addendum)
[place] OFFICE PROGRESS NOTE  Patient Care Team: [name], MD as PCP - General (Family Medicine) Nahser, Wonda Cheng, MD as PCP - Cardiology (Cardiology) Ladene Artist, MD as Consulting Physician (Gastroenterology)  ASSESSMENT & PLAN:  Anemia of chronic disease At this time, patient's hemoglobin has remained relatively stable around 10 since 2019. Her iron profile is most consistent with anemia of chronic disease. Given the borderline elevated MCV and hx of B12 deficiency, we will check B12 and folate to rule out other nutritional deficiencies. The patient last underwent EGD and colonoscopy in 2013, and biopsies showed gastric mucosa with reactive changes and fragments of tubular adenoma from the ascending colon. See the discussion regarding goals of care below.   Mass of upper lobe of left lung PET in June 2019 showed stable left upper lobe nodule with a rim of hypermetabolic thickening peripheral to the lesion, most consistent with radiation changes.  In addition, there was a new hypermetabolic right middle lobe nodule with groundglass pattern, favoring infectious or inflammatory etiology. Patient was evaluated by radiation oncology in July 2019, and no further treatment was recommended at that time.  See details below regarding the goals of care. Briefly, I discussed with the patient that even if the lung cancer recurred, her multiple comorbidities and overall frailty would preclude her from receiving chemotherapy, as the harms of chemotherapy would likely outweigh the benefits. Furthermore, given the patient's wish to be evaluated by hospice care, we will hold off surveillance imaging at this time, pending the patient discussion with hospice service.  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  Patient has not seen her pulmonologist in several years. The patient indicated that over the past several month, her dyspnea has continued to  worsen. Given the severity of her COPD, I discussed the option of hospice with the patient, and she was agreeable to a referral for hospice evaluation.  Appendicitis Patient was recently hospitalized in July 2018 for acute appendicitis. Given her multiple comorbidities, including severe COPD on a liter oxygen, the decision was made to pursue conservative therapy with IV antibiotics. Patient was discharged home with p.o. antibiotics and is currently on Cipro/Flagyl.  DVT (deep venous thrombosis) ([place]) Patient has a remote history of lower extremity DVT after surgery several decades ago.  However, I was unable to find records to confirm this history. Doppler of right lower extremity in 2017 was negative for DVT. Patient is currently on Plavix for antiplatelet therapy for the history of coronary artery disease. No indication for consideration of anticoagulation at this time.  Hepatic lesion Given that the subcentimeter hepatic lesions are consistent with cysts and have been stable in size for over 2 years, there is no indication for routine imaging surveillance.  Goals of care, counseling/discussion During patient's hospitalization in July 2019 for acute appendicitis, palliative care was consulted due to the patient's multiple comorbidities and the risk of respiratory failure if she had undergone surgery. Her CODE STATUS was changed to DNR/DNI at that time. She was again hospitalized in August 2019 at [facility] for gastroenteritis/enterocolitis.  No intervention was performed, such as endoscopy, due to the risk of respiratory failure.  I reaffirmed with the patient today that her CODE STATUS remains DNR/DNI. The patient expressed her wish to be comfortable, and not to undergo any heroic measure or invasive procedure.  Furthermore, she understood that her multiple comorbidities, including severe COPD, are not reversible, and therefore she wants to focus  on symptom control.  I discussed with  the patient about some of the benefits and resources that hospice service can offer, and the patient is interested in meeting with hospice physician to further discuss her goals of care and the possibility of enrolling in hospice. The patient indicated that some of her children have some different opinion regarding goals of care, and I strongly encouraged her to bring her children to the next visit with hospice physician so that they can all be on the same page.   Orders Placed This Encounter  Procedures  . CBC with Differential ([facility] Only)    Standing Status:   Future    Standing Expiration Date:   05/20/2019  . CMP ([facility] only)    Standing Status:   Future    Standing Expiration Date:   05/20/2019  . Ferritin    Standing Status:   Future    Standing Expiration Date:   05/20/2019  . Iron and TIBC    Standing Status:   Future    Standing Expiration Date:   05/20/2019  . Ambulatory referral to Hospice    Referral Priority:   Routine    Referral Type:   Consultation    Referral Reason:   Specialty Services Required    Requested Specialty:   Hospice Services    Number of Visits Requested:   1    All questions were answered. The patient knows to call the clinic with any problems, questions or concerns. No barriers to learning was detected.  A total of more than 60 minutes were spent face-to-face with the patient during this encounter and over half of that time was spent on counseling and coordination of care as outlined above.   Tentatively return to clinic in 2 months for labs and follow-up. However, if the pt elects to pursue hospice care, she understands that she can cancel the appt.   Tish Men, MD 04/16/2018 4:04 PM  CHIEF COMPLAINT: "I am here for my anemia"  INTERVAL HISTORY: Tamara Adams returns to clinic for follow-up of anemia chronic disease.  She was recently hospitalized in August 2019 at [facility] in [place] for abdominal pain and was found with  gastroenteritis/enterocolitis on CT scan.  She was admitted for IV fluids and observation; however, no endoscopy was done due to her tenuous respiratory status.  Her symptoms improved after conservative management and she was discharged home with instruction to continue oral antibiotics (Cipro and Flagyl).  Patient reports that since her most recent hospitalization, her abdominal pain has been intermittent in the periumbilical region, for which she takes Norco approximately once a day with symptomatic relief.  She also reports constant nausea, for which she takes Zofran and Phenergan at least 2-3 times per day as well as GI cocktail 2-3 times per day.  Her bowel movement is sporadic despite taking MiraLAX and laxatives.  She also reports that over the past several month, her shortness of breath has been progressively worse, limiting her to walking only 20 feet before having to stop.  She can perform some ADLs at home, but her ability is very limited due to worsening shortness of breath.  In addition she reports constant fatigue and occasional palpitation, but denies any fever, chill, night sweats, chest pain, vomiting, dysuria, hematuria, hematochezia, or melena.   When asked what her goals are regarding her overall care, patient stated that she just wished to to be comfortable and that her abdominal pain and nausea to be controlled.  She understood that her COPD and other comorbidities were not reversible, and that she did not wish to pursue aggressive intervention, such as CPR or intubation.  SUMMARY OF ONCOLOGIC HISTORY:  No history exists.    REVIEW OF SYSTEMS:   Constitutional: ( - ) fevers, ( - )  chills , ( - ) night sweats Eyes: ( - ) blurriness of vision, ( - ) double vision, ( - ) watery eyes Ears, nose, mouth, throat, and face: ( - ) mucositis, ( - ) sore throat Respiratory: ( - ) cough, ( - ) dyspnea, ( - ) wheezes Cardiovascular: ( - ) chest discomfort, ( - ) lower extremity swelling Skin:  ( - ) abnormal skin rashes Lymphatics: ( - ) new lymphadenopathy, ( - ) easy bruising Neurological: ( - ) numbness, ( - ) tingling Behavioral/Psych: ( - ) mood change, ( - ) new changes  All other systems were reviewed with the patient and are negative.  I have reviewed the past medical history, past surgical history, social history and family history with the patient and they are unchanged from previous note.  ALLERGIES:  is allergic to tyloxapol; maxidex [dexamethasone]; advair diskus [fluticasone-salmeterol]; remeron [mirtazapine]; trazodone and nefazodone; and tylox [oxycodone-acetaminophen].  MEDICATIONS:  Current Outpatient Medications  Medication Sig Dispense Refill  . acetaminophen (TYLENOL) 325 MG tablet Take 325-650 mg by mouth every 6 (six) hours as needed for headache.    . albuterol (PROVENTIL) (2.5 MG/3ML) 0.083% nebulizer solution USE 1 VIAL (3 ML) VIA NEBULIZER EVERY 6 HOURS AS NEEDED FOR WHEEZING OR SHORTNESS OF BREATH 810 mL 12  . AMBULATORY NON FORMULARY MEDICATION Medication Name: Needs oxygen concentrator set to 6 liter per minutes as she has a long tubing on her machine.  Fax to [facility] (Patient taking differently: Medication Name: Needs oxygen concentrator set to 8 liter per minutes as she has a long tubing on her machine.  Fax to [facility]) 1 vial 0  . AMBULATORY NON FORMULARY MEDICATION Medication Name: GI cocktail: 131mL Lidocaine2%, 121ml Maalox, 64ml Donnatol. Ok to take twice daily. 250 mL 0  . amLODipine (NORVASC) 10 MG tablet Take 1 tablet (10 mg total) by mouth daily. 30 tablet 1  . belladonna-PHENObarbital (DONNATAL) 16.2 MG/5ML ELIX Take 5 mLs (16.2 mg total) by mouth 2 (two) times daily as needed for cramping or pain (of the abdomen). 900 mL 1  . BREO ELLIPTA 100-25 MCG/INH AEPB USE 1 INHALATION DAILY 360 each 2  . clopidogrel (PLAVIX) 75 MG tablet TAKE 1 TABLET DAILY 90 tablet 1  . docusate sodium (COLACE) 100 MG capsule Take 1 capsule (100 mg total) by mouth daily.  30 capsule 0  . dronabinol (MARINOL) 2.5 MG capsule Take 1 capsule (2.5 mg total) by mouth 2 (two) times daily before lunch and supper. 20 capsule 0  . feeding supplement (BOOST / RESOURCE BREEZE) LIQD Take 1 Container by mouth 2 (two) times daily between meals. Chocolate boost    . guaiFENesin (MUCINEX) 600 MG 12 hr tablet Take 1 tablet (600 mg total) by mouth 2 (two) times daily. 30 tablet 0  . hydrALAZINE (APRESOLINE) 25 MG tablet Take 3 tablets (75 mg total) by mouth 2 (two) times daily. 180 tablet 1  . HYDROcodone-acetaminophen (NORCO) 10-325 MG tablet Take 1 tablet by mouth every 6 (six) hours as needed. 60 tablet 0  . INCRUSE ELLIPTA 62.5 MCG/INH AEPB USE 1 INHALATION DAILY 90 each 3  . lidocaine (XYLOCAINE) 2 % solution Mix 1:2:2 -  donnatal, maalox, and viscoise lidocaine %. Dose 50ml po BID PRN. 300 mL 5  . Lidocaine HCl 2 % LIQD 20 mL Mouth/Throat swish and spit 2 times daily PRN 3 Bottle 1  . losartan (COZAAR) 50 MG tablet Take 1 tablet (50 mg total) by mouth daily. 90 tablet 1  . metoprolol tartrate (LOPRESSOR) 50 MG tablet Take 1 tablet (50 mg total) by mouth 2 (two) times daily. 180 tablet 2  . Multiple Vitamin (MULTIVITAMIN WITH MINERALS) TABS tablet Take 1 tablet by mouth daily. Pt uses One-A-Day brand    . Nutritional Supplements (FEEDING SUPPLEMENT, BOOST BREEZE,) LIQD Take 1 Can by mouth 3 (three) times daily. 120 Can 0  . omeprazole (PRILOSEC) 40 MG capsule Take 1 capsule (40 mg total) by mouth 2 (two) times daily. 180 capsule 3  . ondansetron (ZOFRAN-ODT) 8 MG disintegrating tablet PLACE 1 TABLET ON THE TONGUE EVERY 8 HOURS AS NEEDED FOR NAUSEA 90 tablet 3  . polyethylene glycol (MIRALAX / GLYCOLAX) packet Take 17 g by mouth daily. 14 each 0  . predniSONE (DELTASONE) 10 MG tablet TAKE 1 TABLET DAILY WITH BREAKFAST 90 tablet 0  . promethazine (PHENERGAN) 25 MG tablet Take 1 tablet (25 mg total) by mouth every 8 (eight) hours as needed for nausea or vomiting. 90 tablet 3  .  ranitidine (ZANTAC) 150 MG tablet TAKE 1 TABLET DAILY 90 tablet 4  . sucralfate (CARAFATE) 1 GM/10ML suspension Take 10 mLs (1 g total) by mouth 4 (four) times daily -  with meals and at bedtime. 420 mL 3  . traMADol (ULTRAM) 50 MG tablet Take 1 tablet (50 mg total) by mouth every 6 (six) hours as needed (pain). 270 tablet 1   No current facility-administered medications for this visit.     PHYSICAL EXAMINATION: ECOG PERFORMANCE STATUS: 2 - Symptomatic, <50% confined to bed  Vitals:   04/15/18 1349  BP: (!) 148/56  Pulse: 64  Resp: 17  Temp: 97.9 F (36.6 C)  SpO2: 100%   Filed Weights   04/15/18 1349  Weight: 100 lb 12.8 oz (45.7 kg)    GENERAL: alert, thin/frail, sitting in a wheelchair on supplemental oxygen, in no acute distress SKIN: skin color, texture, turgor are normal, no rashes or significant lesions EYES: conjunctiva are pink and non-injected, sclera clear OROPHARYNX: no exudate, no erythema; lips, buccal mucosa, and tongue normal  NECK: supple, non-tender LYMPH:  no palpable lymphadenopathy in the cervical or axillary region LUNGS: decreased air movement bilaterally, no wheezing, rhonchi or rales  HEART: regular rate & rhythm and no murmurs and no lower extremity edema ABDOMEN: soft, non-tender, non-distended, normal bowel sounds Musculoskeletal: no cyanosis of digits and no clubbing  PSYCH: alert & oriented x 3, fluent speech NEURO: no focal motor/sensory deficits  LABORATORY DATA:  I have reviewed the data as listed    Component Value Date/Time   NA 135 04/15/2018 1322   K 4.8 (H) 04/15/2018 1322   CL 96 (L) 04/15/2018 1322   CO2 33 04/15/2018 1322   GLUCOSE 117 04/15/2018 1322   BUN 8 04/15/2018 1322   CREATININE 0.90 04/15/2018 1322   CREATININE 0.72 08/05/2017 1440   CALCIUM 8.9 04/15/2018 1322   PROT 7.0 04/15/2018 1322   ALBUMIN 3.4 (L) 04/15/2018 1322   AST 26 04/15/2018 1322   ALT 21 04/15/2018 1322   ALKPHOS 73 04/15/2018 1322   BILITOT 0.4  04/15/2018 1322   GFRNONAA >60 02/12/2018 0446   GFRNONAA >60 01/13/2018 1335  GFRNONAA 81 08/05/2017 1440   GFRAA >60 02/12/2018 0446   GFRAA >60 01/13/2018 1335   GFRAA 94 08/05/2017 1440    No results found for: SPEP, UPEP  Lab Results  Component Value Date   WBC 8.7 04/15/2018   NEUTROABS 5.2 04/15/2018   HGB 11.3 (L) 04/15/2018   HCT 36.1 04/15/2018   MCV 96.5 04/15/2018   PLT 412 (H) 04/15/2018      Chemistry      Component Value Date/Time   NA 135 04/15/2018 1322   K 4.8 (H) 04/15/2018 1322   CL 96 (L) 04/15/2018 1322   CO2 33 04/15/2018 1322   BUN 8 04/15/2018 1322   CREATININE 0.90 04/15/2018 1322   CREATININE 0.72 08/05/2017 1440      Component Value Date/Time   CALCIUM 8.9 04/15/2018 1322   ALKPHOS 73 04/15/2018 1322   AST 26 04/15/2018 1322   ALT 21 04/15/2018 1322   BILITOT 0.4 04/15/2018 1322

## 2018-04-15 NOTE — Telephone Encounter (Signed)
Hospice referral placed with [facility] per Dr Lorette Ang order.

## 2018-04-16 ENCOUNTER — Ambulatory Visit (INDEPENDENT_AMBULATORY_CARE_PROVIDER_SITE_OTHER): Payer: Medicare Other | Admitting: Family Medicine

## 2018-04-16 ENCOUNTER — Encounter: Payer: Self-pay | Admitting: Family Medicine

## 2018-04-16 VITALS — BP 150/78 | HR 72 | Temp 97.9°F | Ht 64.0 in | Wt 100.0 lb

## 2018-04-16 DIAGNOSIS — Z515 Encounter for palliative care: Secondary | ICD-10-CM

## 2018-04-16 DIAGNOSIS — I2510 Atherosclerotic heart disease of native coronary artery without angina pectoris: Secondary | ICD-10-CM | POA: Diagnosis not present

## 2018-04-16 DIAGNOSIS — R110 Nausea: Secondary | ICD-10-CM | POA: Diagnosis not present

## 2018-04-16 DIAGNOSIS — J441 Chronic obstructive pulmonary disease with (acute) exacerbation: Secondary | ICD-10-CM | POA: Diagnosis not present

## 2018-04-16 DIAGNOSIS — Z23 Encounter for immunization: Secondary | ICD-10-CM | POA: Diagnosis not present

## 2018-04-16 LAB — IRON AND TIBC
Iron: 128 ug/dL (ref 41–142)
Saturation Ratios: 63 % — ABNORMAL HIGH (ref 21–57)
TIBC: 203 ug/dL — ABNORMAL LOW (ref 236–444)
UIBC: 74 ug/dL

## 2018-04-16 LAB — FERRITIN: Ferritin: 186 ng/mL (ref 11–307)

## 2018-04-16 MED ORDER — PB-HYOSCY-ATROPINE-SCOPOLAMINE 16.2 MG/5ML PO ELIX: 5.0000 mL | ORAL_SOLUTION | Freq: Two times a day (BID) | ORAL | 1 refills | Status: AC | PRN

## 2018-04-16 MED ORDER — SUCRALFATE 1 GM/10ML PO SUSP: 1.0000 g | Freq: Three times a day (TID) | ORAL | 3 refills | Status: AC

## 2018-04-16 NOTE — Progress Notes (Signed)
Subjective:    Patient ID: Tamara Adams, female    DOB: September 26, 1940, 77 y.o.   MRN: [id number]  HPI 77 year old female with end-stage COPD called on September 17 after having 2 days of I ncreased cough and sputum production.  At the time she was unable to get transportation here so asked if we would call in an antibiotic.  Send over prescription for azithromycin to her local pharmacy and asked her to try to follow-up sometime this week if she was able to get transportation.  Actually completed the antibiotic this weekend and says she is feeling much better.  She feels like the cough and sputum production have almost completely resolved.  She is doing better.  Chronic nausea-we were able to get a short supply of her GI cocktail sent over to [place] long we did seem to make sure that she is getting the medication that she needs from her mail order.  She would also like a refill on the Carafate.  She said she had tried it at one point and was not convinced that it was really helping but she recently retried it and felt like it was actually helpful and allowed her to have an alternative to her GI cocktail.  She would like a new prescription sent to her mail order for that as well.  I also reviewed the notes and she is going to engage with hospice.  They have actually scheduled to come out on Monday for consultation.  She was initially fearful that they would take over her care and she would not be allowed to be seen by me or our office.  Review of Systems  BP (!) 150/78   Pulse 72   Temp 97.9 F (36.6 C)   Ht 5\' 4"  (1.626 m)   Wt 100 lb (45.4 kg)   SpO2 97% Comment: 5L  BMI 17.16 kg/m     Allergies  Allergen Reactions  . Tyloxapol Itching  . Maxidex [Dexamethasone] Other (See Comments)    DEHYDRATION AND HEART RACING  . Advair Diskus [Fluticasone-Salmeterol] Other (See Comments)    No benefit with lungs (INEFFECTIVE)  . Remeron [Mirtazapine] Other (See Comments)    CAUSED NIGHTMARES  .  Trazodone And Nefazodone Other (See Comments)    Heart pounding  . Tylox [Oxycodone-Acetaminophen] Itching    Past Medical History:  Diagnosis Date  . Anemia    as a child  . Arthritis    "hands" (12/24/2014)  . Complication of anesthesia    " My blood gas dropped during surgery so I was left on a ventilator and in ICU for 3 days."  . COPD (chronic obstructive pulmonary disease) (HCC)    FVC 72%, FEV1 29%, FEV1 ratio 32% (very severe (COPD)  . COPD (chronic obstructive pulmonary disease) ([place])   . Degenerative disc disease   . Depression with anxiety   . Diverticulosis   . DVT (deep venous thrombosis) ([place])    "LLE; years ago after a surgery"  . Essential hypertension   . Fluttering sensation of heart    pt put on metoprolol as a result  . GERD (gastroesophageal reflux disease)    PMH  . H/O hiatal hernia   . Headache    "maybe weekly" (12/24/2014)  . Hyperlipidemia   . Hypertension   . Kidney stone   . Liver lesion   . MI (myocardial infarction) ([place]) 07/19/2017  . On home oxygen therapy    "3L; sleep w/it; use it when I rest" (  12/24/2014)  . Peptic ulcer   . Pinched nerve    in back  . Pneumonia   . PONV (postoperative nausea and vomiting)   . Shortness of breath dyspnea    with exertion  . Skipped heart beats   . Small bowel obstruction ([place])    "several times; OR twice" (12/24/2014)  . Tubular adenoma of colon 09/2011  . Wears glasses     Past Surgical History:  Procedure Laterality Date  . ABDOMINAL ADHESION SURGERY  12/24/2014  . ABDOMINAL HYSTERECTOMY  1987   and one ovary  . CATARACT EXTRACTION W/ INTRAOCULAR LENS  IMPLANT, BILATERAL Right 06/22/2012   Dr. Lester Kinsman.   Marland Kitchen CATARACT EXTRACTION W/PHACO Left 06/12/2012   Dr. Boykin Reaper  . CHOLECYSTECTOMY N/A 08/18/2013   Procedure: LAPAROSCOPIC CHOLECYSTECTOMY;  Surgeon: Harl Bowie, MD;  Location: [facility];  Service: General;  Laterality: N/A;  . DILATION AND CURETTAGE OF UTERUS    . EXPLORATORY LAPAROTOMY   12/24/2014  . HERNIA REPAIR    . INCISIONAL HERNIA REPAIR  12/24/2014  . INCISIONAL HERNIA REPAIR N/A 12/24/2014   Procedure: HERNIA REPAIR INCISIONAL;  Surgeon: Coralie Keens, MD;  Location: [facility];  Service: General;  Laterality: N/A;  . LAPAROTOMY N/A 12/24/2014   Procedure: EXPLORATORY LAPAROTOMY;  Surgeon: Coralie Keens, MD;  Location: [facility];  Service: General;  Laterality: N/A;  . LYSIS OF ADHESION N/A 12/24/2014   Procedure: LYSIS OF ADHESION;  Surgeon: Coralie Keens, MD;  Location: [facility];  Service: General;  Laterality: N/A;  . MULTIPLE TOOTH EXTRACTIONS    . OOPHORECTOMY  1992  . SMALL INTESTINE SURGERY     for a blockage, no bowel was removed, just kinked from scar tissue  . TUBAL LIGATION    . URETHRAL DILATION      Social History   Socioeconomic History  . Marital status: Married    Spouse name: Not on file  . Number of children: 3  . Years of education: Not on file  . Highest education level: Not on file  Occupational History  . Occupation: retired.    Social Needs  . Financial resource strain: Not on file  . Food insecurity:    Worry: Not on file    Inability: Not on file  . Transportation needs:    Medical: Not on file    Non-medical: Not on file  Tobacco Use  . Smoking status: Former Smoker    Packs/day: 0.25    Years: 57.00    Pack years: 14.25    Types: Cigarettes    Last attempt to quit: 07/25/2015    Years since quitting: 2.7  . Smokeless tobacco: Never Used  . Tobacco comment: 5 cigarettes a day  Substance and Sexual Activity  . Alcohol use: No    Alcohol/week: 0.0 standard drinks  . Drug use: No  . Sexual activity: Never  Lifestyle  . Physical activity:    Days per week: Not on file    Minutes per session: Not on file  . Stress: Not on file  Relationships  . Social connections:    Talks on phone: Not on file    Gets together: Not on file    Attends religious service: Not on file    Active member of club or organization: Not on file     Attends meetings of clubs or organizations: Not on file    Relationship status: Not on file  . Intimate partner violence:    Fear of current  or ex partner: Not on file    Emotionally abused: Not on file    Physically abused: Not on file    Forced sexual activity: Not on file  Other Topics Concern  . Not on file  Social History Narrative   No regular exercise.     Family History  Problem Relation Age of Onset  . Hypertension Mother   . Ovarian cancer Mother   . Glaucoma Mother   . Glaucoma Sister   . Pancreatic cancer Maternal Uncle   . Colon cancer Neg Hx   . Colon polyps Neg Hx   . Diabetes Neg Hx   . Kidney disease Neg Hx   . Gallbladder disease Neg Hx   . Esophageal cancer Neg Hx     Outpatient Encounter Medications as of 04/16/2018  Medication Sig  . acetaminophen (TYLENOL) 325 MG tablet Take 325-650 mg by mouth every 6 (six) hours as needed for headache.  . albuterol (PROVENTIL) (2.5 MG/3ML) 0.083% nebulizer solution USE 1 VIAL (3 ML) VIA NEBULIZER EVERY 6 HOURS AS NEEDED FOR WHEEZING OR SHORTNESS OF BREATH  . AMBULATORY NON FORMULARY MEDICATION Medication Name: Needs oxygen concentrator set to 6 liter per minutes as she has a long tubing on her machine.  Fax to [facility] (Patient taking differently: Medication Name: Needs oxygen concentrator set to 8 liter per minutes as she has a long tubing on her machine.  Fax to [employer])  . AMBULATORY NON FORMULARY MEDICATION Medication Name: GI cocktail: 151mL Lidocaine2%, 14ml Maalox, 56ml Donnatol. Ok to take twice daily.  Marland Kitchen amLODipine (NORVASC) 10 MG tablet Take 1 tablet (10 mg total) by mouth daily.  . belladonna-PHENObarbital (DONNATAL) 16.2 MG/5ML ELIX Take 5 mLs (16.2 mg total) by mouth 2 (two) times daily as needed for cramping or pain (of the abdomen).  Marland Kitchen BREO ELLIPTA 100-25 MCG/INH AEPB USE 1 INHALATION DAILY  . clopidogrel (PLAVIX) 75 MG tablet TAKE 1 TABLET DAILY  . docusate sodium (COLACE) 100 MG capsule Take 1 capsule (100 mg  total) by mouth daily.  Marland Kitchen dronabinol (MARINOL) 2.5 MG capsule Take 1 capsule (2.5 mg total) by mouth 2 (two) times daily before lunch and supper.  . feeding supplement (BOOST / RESOURCE BREEZE) LIQD Take 1 Container by mouth 2 (two) times daily between meals. Chocolate boost  . guaiFENesin (MUCINEX) 600 MG 12 hr tablet Take 1 tablet (600 mg total) by mouth 2 (two) times daily.  . hydrALAZINE (APRESOLINE) 25 MG tablet Take 3 tablets (75 mg total) by mouth 2 (two) times daily.  Marland Kitchen HYDROcodone-acetaminophen (NORCO) 10-325 MG tablet Take 1 tablet by mouth every 6 (six) hours as needed.  . INCRUSE ELLIPTA 62.5 MCG/INH AEPB USE 1 INHALATION DAILY  . lidocaine (XYLOCAINE) 2 % solution Mix 1:2:2 - donnatal, maalox, and viscoise lidocaine %. Dose 60ml po BID PRN.  Marland Kitchen Lidocaine HCl 2 % LIQD 20 mL Mouth/Throat swish and spit 2 times daily PRN  . losartan (COZAAR) 50 MG tablet Take 1 tablet (50 mg total) by mouth daily.  . metoprolol tartrate (LOPRESSOR) 50 MG tablet Take 1 tablet (50 mg total) by mouth 2 (two) times daily.  . Multiple Vitamin (MULTIVITAMIN WITH MINERALS) TABS tablet Take 1 tablet by mouth daily. Pt uses One-A-Day brand  . Nutritional Supplements (FEEDING SUPPLEMENT, BOOST BREEZE,) LIQD Take 1 Can by mouth 3 (three) times daily.  Marland Kitchen omeprazole (PRILOSEC) 40 MG capsule Take 1 capsule (40 mg total) by mouth 2 (two) times daily.  . ondansetron (ZOFRAN-ODT) 8  MG disintegrating tablet PLACE 1 TABLET ON THE TONGUE EVERY 8 HOURS AS NEEDED FOR NAUSEA  . polyethylene glycol (MIRALAX / GLYCOLAX) packet Take 17 g by mouth daily.  . predniSONE (DELTASONE) 10 MG tablet TAKE 1 TABLET DAILY WITH BREAKFAST  . promethazine (PHENERGAN) 25 MG tablet Take 1 tablet (25 mg total) by mouth every 8 (eight) hours as needed for nausea or vomiting.  . ranitidine (ZANTAC) 150 MG tablet TAKE 1 TABLET DAILY  . sucralfate (CARAFATE) 1 GM/10ML suspension Take 10 mLs (1 g total) by mouth 4 (four) times daily -  with meals and  at bedtime.  . traMADol (ULTRAM) 50 MG tablet Take 1 tablet (50 mg total) by mouth every 6 (six) hours as needed (pain).  . [DISCONTINUED] belladonna-PHENObarbital (DONNATAL) 16.2 MG/5ML ELIX Mix 1:2:2 - donnatal, maalox, and viscoise lidocaine %. Dose 56ml po BID PRN.  . [DISCONTINUED] sucralfate (CARAFATE) 1 GM/10ML suspension Take 10 mLs (1 g total) by mouth 4 (four) times daily -  with meals and at bedtime.   No facility-administered encounter medications on file as of 04/16/2018.          Objective:   Physical Exam  Constitutional: She is oriented to person, place, and time. She appears well-developed and well-nourished.  HENT:  Head: Normocephalic and atraumatic.  Cardiovascular: Normal rate, regular rhythm and normal heart sounds.  Pulmonary/Chest: Effort normal and breath sounds normal.  Neurological: She is alert and oriented to person, place, and time.  Skin: Skin is warm and dry.  Psychiatric: She has a normal mood and affect. Her behavior is normal.        Assessment & Plan:  COPD exacerbation-she is back to her baseline which is chronic oxygen therapy with severe COPD and chronic respiratory failure.  She completed the antibiotics and is feeling much better.  Please give [place] a call if any changes.  Chronic nausea-did send over new prescription for Carafate.  Also sent a new prescription for Donnatal.  She mixes it with Maalox and lidocaine, it has been cheaper to do this separately then to have it mixed.  Mix 1:2:2 - donnatal, maalox, and viscoise lidocaine %. Dose 84ml po BID PRN.  Consultation with hospice-I do actually think she be a good candidate for hospice and we have been having end-of-life care discussions over the last year on and off.  Her family may not be as supportive as she would like.  We did discuss that I would be happy to remain her primary care provider and that that is not unusual she would not have to get that up to be under hospice care.

## 2018-04-17 ENCOUNTER — Ambulatory Visit: Payer: Medicare Other | Admitting: Cardiovascular Disease

## 2018-04-17 IMAGING — DX DG CHEST 2V
2 series · 2 of 2 positions shown · non-contrast
Comparison: Chest x-ray of March 24, 2016 and chest CT scan of
March 25, 2016.

CLINICAL DATA: Chronic shortness of breath, history of COPD,
hypertension, sepsis. Former smoker.

EXAM:
CHEST  2 VIEW

[w chest pa]
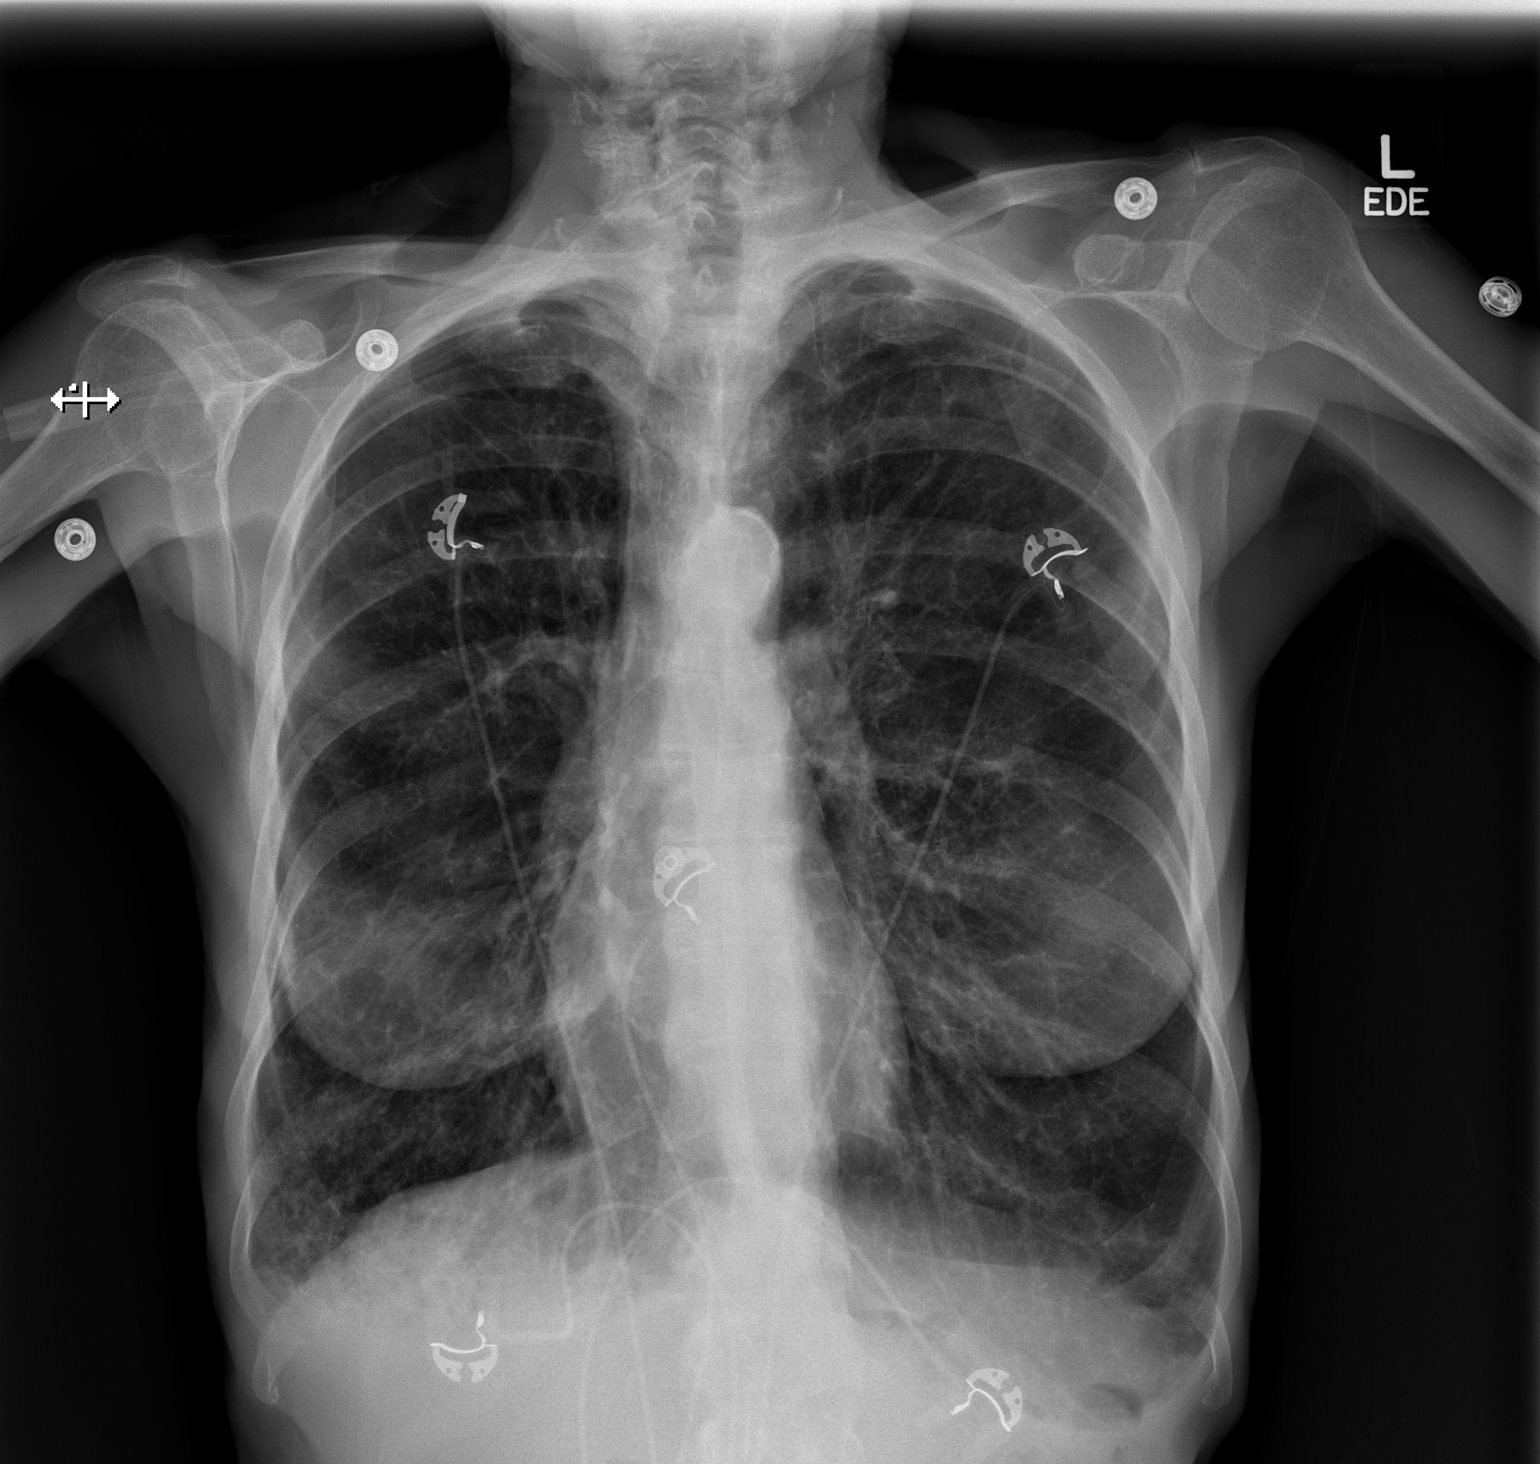

[w chest lat]
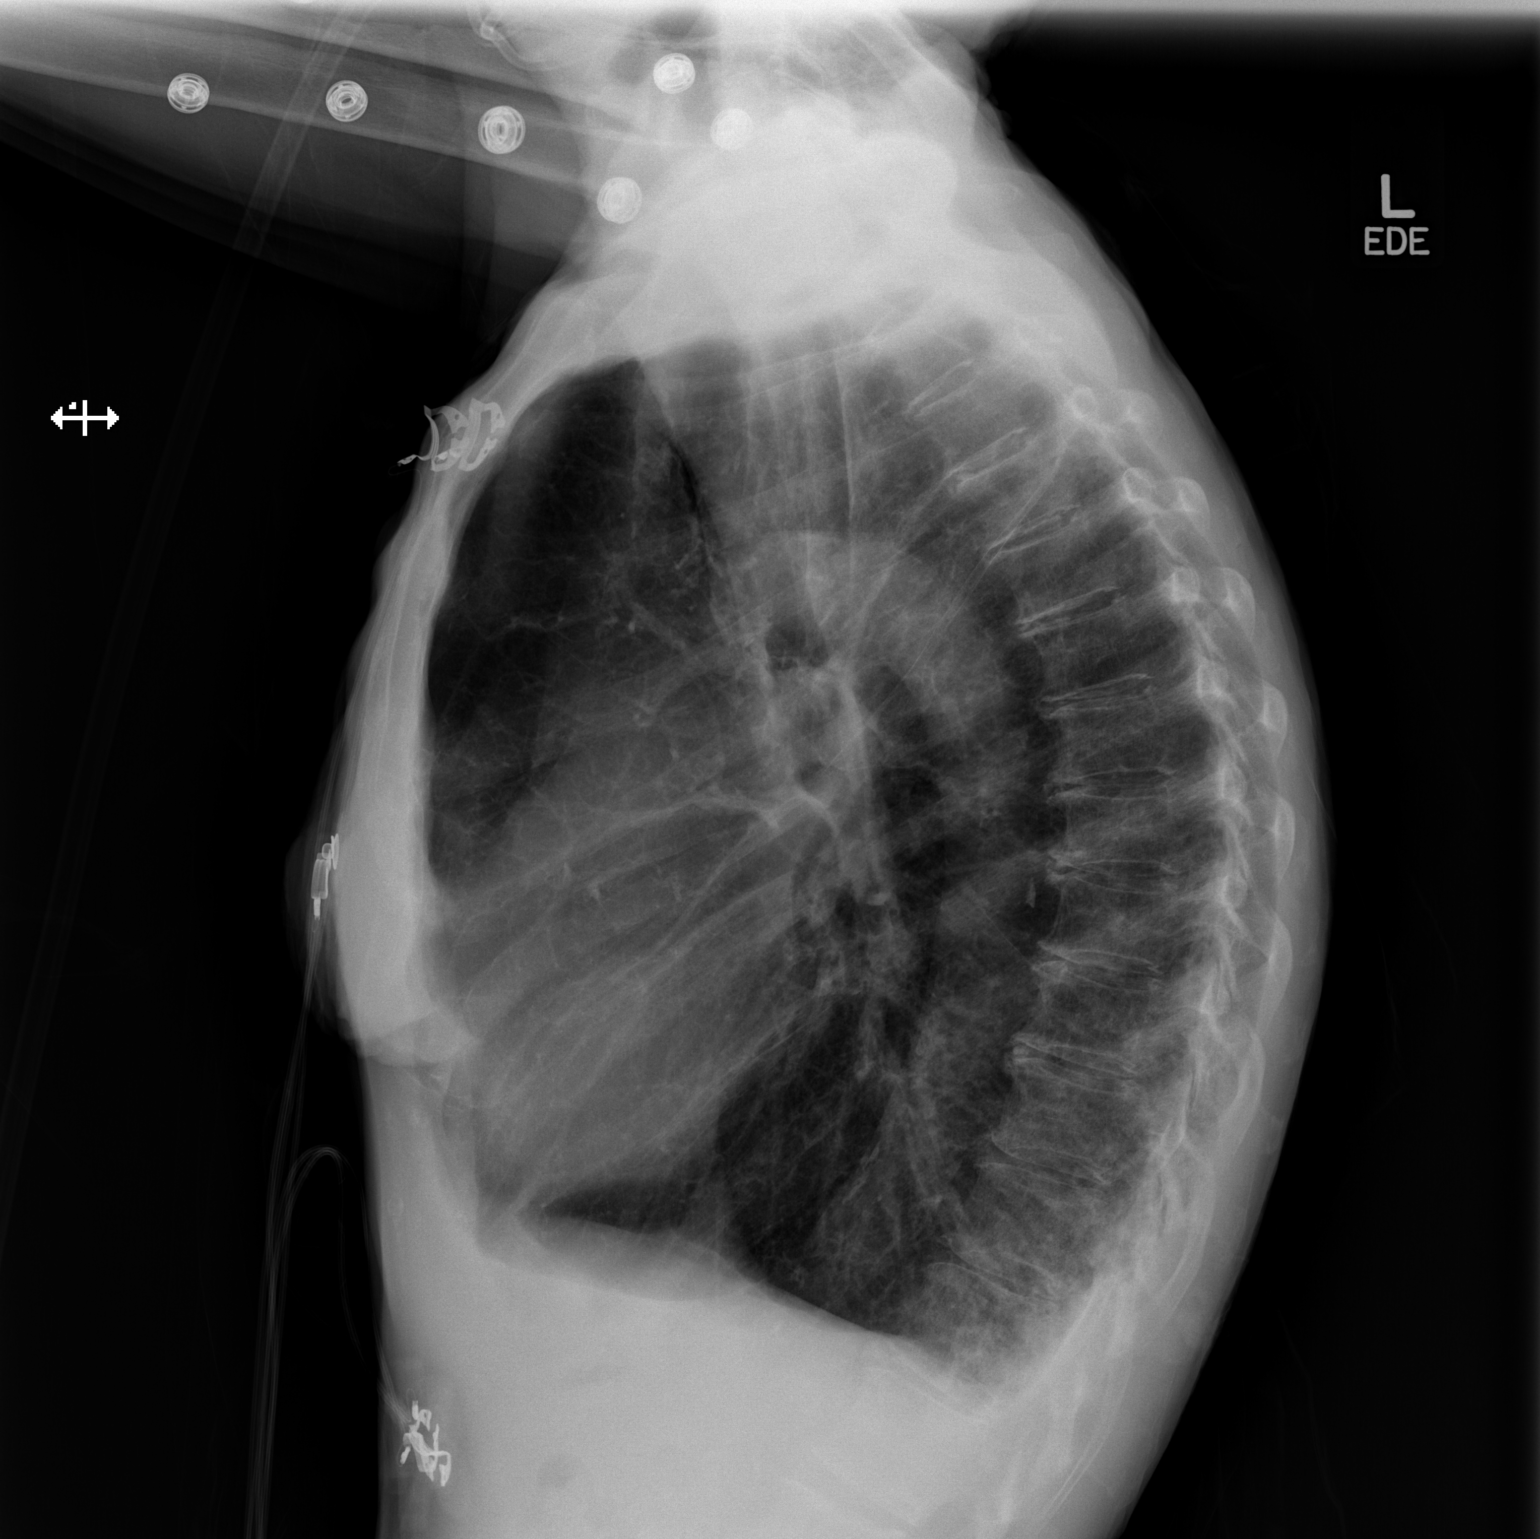

[2 of 2 positions shown; findings below may reference images not displayed]

FINDINGS: The lungs remain markedly hyperinflated with hemidiaphragm
flattening and increased AP dimension of the thorax. The lower lobe
interstitial markings are less conspicuous today. Chronic fibrotic
changes persist however. There is no pleural effusion or
pneumothorax. The heart and pulmonary vascularity are normal. There
is calcification within the wall of the aortic arch. There is mild
multilevel degenerative disc disease of the thoracic spine.
IMPRESSION: COPD. Decreased interstitial infiltrate in the lower lobes
consistent with response to therapy. No CHF.

Aortic atherosclerosis.

## 2018-04-21 ENCOUNTER — Telehealth: Payer: Self-pay

## 2018-04-21 DIAGNOSIS — J449 Chronic obstructive pulmonary disease, unspecified: Secondary | ICD-10-CM | POA: Diagnosis not present

## 2018-04-21 DIAGNOSIS — K219 Gastro-esophageal reflux disease without esophagitis: Secondary | ICD-10-CM | POA: Diagnosis not present

## 2018-04-21 DIAGNOSIS — I4891 Unspecified atrial fibrillation: Secondary | ICD-10-CM | POA: Diagnosis not present

## 2018-04-21 DIAGNOSIS — J9620 Acute and chronic respiratory failure, unspecified whether with hypoxia or hypercapnia: Secondary | ICD-10-CM | POA: Diagnosis not present

## 2018-04-21 DIAGNOSIS — D381 Neoplasm of uncertain behavior of trachea, bronchus and lung: Secondary | ICD-10-CM | POA: Diagnosis not present

## 2018-04-21 DIAGNOSIS — E46 Unspecified protein-calorie malnutrition: Secondary | ICD-10-CM | POA: Diagnosis not present

## 2018-04-21 DIAGNOSIS — I10 Essential (primary) hypertension: Secondary | ICD-10-CM | POA: Diagnosis not present

## 2018-04-21 NOTE — Telephone Encounter (Signed)
Oncology/Yan Maylon Peppers, MD referred patient to Hospice care. Amber with Hospice called and states the patient would like Dr Madilyn Fireman to be the attending doctor. Please advise.

## 2018-04-22 DIAGNOSIS — I4891 Unspecified atrial fibrillation: Secondary | ICD-10-CM | POA: Diagnosis not present

## 2018-04-22 DIAGNOSIS — I10 Essential (primary) hypertension: Secondary | ICD-10-CM | POA: Diagnosis not present

## 2018-04-22 DIAGNOSIS — J9620 Acute and chronic respiratory failure, unspecified whether with hypoxia or hypercapnia: Secondary | ICD-10-CM | POA: Diagnosis not present

## 2018-04-22 DIAGNOSIS — K219 Gastro-esophageal reflux disease without esophagitis: Secondary | ICD-10-CM | POA: Diagnosis not present

## 2018-04-22 DIAGNOSIS — J449 Chronic obstructive pulmonary disease, unspecified: Secondary | ICD-10-CM | POA: Diagnosis not present

## 2018-04-22 DIAGNOSIS — E46 Unspecified protein-calorie malnutrition: Secondary | ICD-10-CM | POA: Diagnosis not present

## 2018-04-22 DIAGNOSIS — D381 Neoplasm of uncertain behavior of trachea, bronchus and lung: Secondary | ICD-10-CM | POA: Diagnosis not present

## 2018-04-23 NOTE — Telephone Encounter (Signed)
Yes. I would be happy to be the attending for her

## 2018-04-23 NOTE — Telephone Encounter (Signed)
Amber from hospice advised, verbalized understanding.

## 2018-04-29 DIAGNOSIS — D381 Neoplasm of uncertain behavior of trachea, bronchus and lung: Secondary | ICD-10-CM | POA: Diagnosis not present

## 2018-04-29 DIAGNOSIS — I4891 Unspecified atrial fibrillation: Secondary | ICD-10-CM | POA: Diagnosis not present

## 2018-04-29 DIAGNOSIS — J9620 Acute and chronic respiratory failure, unspecified whether with hypoxia or hypercapnia: Secondary | ICD-10-CM | POA: Diagnosis not present

## 2018-04-29 DIAGNOSIS — J449 Chronic obstructive pulmonary disease, unspecified: Secondary | ICD-10-CM | POA: Diagnosis not present

## 2018-04-29 DIAGNOSIS — E46 Unspecified protein-calorie malnutrition: Secondary | ICD-10-CM | POA: Diagnosis not present

## 2018-04-29 DIAGNOSIS — I10 Essential (primary) hypertension: Secondary | ICD-10-CM | POA: Diagnosis not present

## 2018-04-30 ENCOUNTER — Telehealth: Payer: Self-pay | Admitting: Radiation Oncology

## 2018-04-30 NOTE — Telephone Encounter (Signed)
Patient called to cancel her May 01, 2018 @ 10:40 three month follow up appointment with Dr. Sondra Come. Patient stated she is under Hospice Care now.

## 2018-05-01 ENCOUNTER — Ambulatory Visit: Payer: Medicare Other | Admitting: Radiation Oncology

## 2018-05-05 DIAGNOSIS — J449 Chronic obstructive pulmonary disease, unspecified: Secondary | ICD-10-CM | POA: Diagnosis not present

## 2018-05-05 DIAGNOSIS — I10 Essential (primary) hypertension: Secondary | ICD-10-CM | POA: Diagnosis not present

## 2018-05-05 DIAGNOSIS — J9620 Acute and chronic respiratory failure, unspecified whether with hypoxia or hypercapnia: Secondary | ICD-10-CM | POA: Diagnosis not present

## 2018-05-05 DIAGNOSIS — E46 Unspecified protein-calorie malnutrition: Secondary | ICD-10-CM | POA: Diagnosis not present

## 2018-05-05 DIAGNOSIS — I4891 Unspecified atrial fibrillation: Secondary | ICD-10-CM | POA: Diagnosis not present

## 2018-05-05 DIAGNOSIS — D381 Neoplasm of uncertain behavior of trachea, bronchus and lung: Secondary | ICD-10-CM | POA: Diagnosis not present

## 2018-05-06 ENCOUNTER — Other Ambulatory Visit: Payer: Self-pay

## 2018-05-06 ENCOUNTER — Other Ambulatory Visit: Payer: Self-pay | Admitting: Family Medicine

## 2018-05-06 DIAGNOSIS — M5136 Other intervertebral disc degeneration, lumbar region: Secondary | ICD-10-CM

## 2018-05-06 DIAGNOSIS — I10 Essential (primary) hypertension: Secondary | ICD-10-CM | POA: Diagnosis not present

## 2018-05-06 DIAGNOSIS — I4891 Unspecified atrial fibrillation: Secondary | ICD-10-CM | POA: Diagnosis not present

## 2018-05-06 DIAGNOSIS — D381 Neoplasm of uncertain behavior of trachea, bronchus and lung: Secondary | ICD-10-CM | POA: Diagnosis not present

## 2018-05-06 DIAGNOSIS — J449 Chronic obstructive pulmonary disease, unspecified: Secondary | ICD-10-CM | POA: Diagnosis not present

## 2018-05-06 DIAGNOSIS — G894 Chronic pain syndrome: Secondary | ICD-10-CM

## 2018-05-06 DIAGNOSIS — J9620 Acute and chronic respiratory failure, unspecified whether with hypoxia or hypercapnia: Secondary | ICD-10-CM | POA: Diagnosis not present

## 2018-05-06 DIAGNOSIS — E46 Unspecified protein-calorie malnutrition: Secondary | ICD-10-CM | POA: Diagnosis not present

## 2018-05-06 MED ORDER — HYDROCODONE-ACETAMINOPHEN 10-325 MG PO TABS: 1.0000 | ORAL_TABLET | Freq: Four times a day (QID) | ORAL | 0 refills | Status: DC | PRN

## 2018-05-06 NOTE — Telephone Encounter (Signed)
Routing to pcp for signature.Johnella Crumm Lynetta, CMA  

## 2018-05-06 NOTE — Telephone Encounter (Signed)
Requests a refill on Hydrocodone. Next follow up in November.

## 2018-05-07 ENCOUNTER — Other Ambulatory Visit: Payer: Self-pay

## 2018-05-07 DIAGNOSIS — G894 Chronic pain syndrome: Secondary | ICD-10-CM

## 2018-05-07 DIAGNOSIS — M5136 Other intervertebral disc degeneration, lumbar region: Secondary | ICD-10-CM

## 2018-05-07 MED ORDER — HYDROCODONE-ACETAMINOPHEN 10-325 MG PO TABS: 1.0000 | ORAL_TABLET | Freq: Four times a day (QID) | ORAL | 0 refills | Status: DC | PRN

## 2018-05-07 NOTE — Telephone Encounter (Signed)
Hydrocodone was sent to [facility] by mistake. I called and cancelled prescription to [facility]. She needs one sent to [facility].

## 2018-05-13 DIAGNOSIS — J449 Chronic obstructive pulmonary disease, unspecified: Secondary | ICD-10-CM | POA: Diagnosis not present

## 2018-05-13 DIAGNOSIS — E46 Unspecified protein-calorie malnutrition: Secondary | ICD-10-CM | POA: Diagnosis not present

## 2018-05-13 DIAGNOSIS — I4891 Unspecified atrial fibrillation: Secondary | ICD-10-CM | POA: Diagnosis not present

## 2018-05-13 DIAGNOSIS — J9620 Acute and chronic respiratory failure, unspecified whether with hypoxia or hypercapnia: Secondary | ICD-10-CM | POA: Diagnosis not present

## 2018-05-13 DIAGNOSIS — I10 Essential (primary) hypertension: Secondary | ICD-10-CM | POA: Diagnosis not present

## 2018-05-13 DIAGNOSIS — D381 Neoplasm of uncertain behavior of trachea, bronchus and lung: Secondary | ICD-10-CM | POA: Diagnosis not present

## 2018-05-20 ENCOUNTER — Telehealth: Payer: Self-pay

## 2018-05-20 DIAGNOSIS — J449 Chronic obstructive pulmonary disease, unspecified: Secondary | ICD-10-CM | POA: Diagnosis not present

## 2018-05-20 DIAGNOSIS — I4891 Unspecified atrial fibrillation: Secondary | ICD-10-CM | POA: Diagnosis not present

## 2018-05-20 DIAGNOSIS — J9620 Acute and chronic respiratory failure, unspecified whether with hypoxia or hypercapnia: Secondary | ICD-10-CM | POA: Diagnosis not present

## 2018-05-20 DIAGNOSIS — D381 Neoplasm of uncertain behavior of trachea, bronchus and lung: Secondary | ICD-10-CM | POA: Diagnosis not present

## 2018-05-20 DIAGNOSIS — I10 Essential (primary) hypertension: Secondary | ICD-10-CM | POA: Diagnosis not present

## 2018-05-20 DIAGNOSIS — E46 Unspecified protein-calorie malnutrition: Secondary | ICD-10-CM | POA: Diagnosis not present

## 2018-05-20 MED ORDER — PROMETHAZINE HCL 25 MG PO TABS: 25.0000 mg | ORAL_TABLET | Freq: Three times a day (TID) | ORAL | 3 refills | Status: AC | PRN

## 2018-05-20 NOTE — Telephone Encounter (Signed)
RX was recently sent to [facility] but Cristela Blue from [facility] in [facility] requesting that this be sent to [facility]. RX has to be sent local in order to be covered by Hospice.   RX sent to local [facility] advised

## 2018-05-23 ENCOUNTER — Other Ambulatory Visit: Payer: Self-pay | Admitting: Family Medicine

## 2018-05-23 DIAGNOSIS — R5382 Chronic fatigue, unspecified: Secondary | ICD-10-CM

## 2018-05-23 DIAGNOSIS — J449 Chronic obstructive pulmonary disease, unspecified: Secondary | ICD-10-CM | POA: Diagnosis not present

## 2018-05-23 DIAGNOSIS — K219 Gastro-esophageal reflux disease without esophagitis: Secondary | ICD-10-CM | POA: Diagnosis not present

## 2018-05-23 DIAGNOSIS — C3490 Malignant neoplasm of unspecified part of unspecified bronchus or lung: Secondary | ICD-10-CM

## 2018-05-23 DIAGNOSIS — J9620 Acute and chronic respiratory failure, unspecified whether with hypoxia or hypercapnia: Secondary | ICD-10-CM | POA: Diagnosis not present

## 2018-05-23 DIAGNOSIS — E46 Unspecified protein-calorie malnutrition: Secondary | ICD-10-CM | POA: Diagnosis not present

## 2018-05-23 DIAGNOSIS — I4891 Unspecified atrial fibrillation: Secondary | ICD-10-CM | POA: Diagnosis not present

## 2018-05-23 DIAGNOSIS — I10 Essential (primary) hypertension: Secondary | ICD-10-CM

## 2018-05-23 DIAGNOSIS — D381 Neoplasm of uncertain behavior of trachea, bronchus and lung: Secondary | ICD-10-CM | POA: Diagnosis not present

## 2018-05-23 DIAGNOSIS — R110 Nausea: Secondary | ICD-10-CM

## 2018-05-27 DIAGNOSIS — I4891 Unspecified atrial fibrillation: Secondary | ICD-10-CM | POA: Diagnosis not present

## 2018-05-27 DIAGNOSIS — J9620 Acute and chronic respiratory failure, unspecified whether with hypoxia or hypercapnia: Secondary | ICD-10-CM | POA: Diagnosis not present

## 2018-05-27 DIAGNOSIS — I10 Essential (primary) hypertension: Secondary | ICD-10-CM | POA: Diagnosis not present

## 2018-05-27 DIAGNOSIS — D381 Neoplasm of uncertain behavior of trachea, bronchus and lung: Secondary | ICD-10-CM | POA: Diagnosis not present

## 2018-05-27 DIAGNOSIS — J449 Chronic obstructive pulmonary disease, unspecified: Secondary | ICD-10-CM | POA: Diagnosis not present

## 2018-05-27 DIAGNOSIS — E46 Unspecified protein-calorie malnutrition: Secondary | ICD-10-CM | POA: Diagnosis not present

## 2018-05-28 DIAGNOSIS — J449 Chronic obstructive pulmonary disease, unspecified: Secondary | ICD-10-CM | POA: Diagnosis not present

## 2018-05-28 DIAGNOSIS — J9620 Acute and chronic respiratory failure, unspecified whether with hypoxia or hypercapnia: Secondary | ICD-10-CM | POA: Diagnosis not present

## 2018-05-28 DIAGNOSIS — I4891 Unspecified atrial fibrillation: Secondary | ICD-10-CM | POA: Diagnosis not present

## 2018-05-28 DIAGNOSIS — E46 Unspecified protein-calorie malnutrition: Secondary | ICD-10-CM | POA: Diagnosis not present

## 2018-05-28 DIAGNOSIS — D381 Neoplasm of uncertain behavior of trachea, bronchus and lung: Secondary | ICD-10-CM | POA: Diagnosis not present

## 2018-05-28 DIAGNOSIS — I10 Essential (primary) hypertension: Secondary | ICD-10-CM | POA: Diagnosis not present

## 2018-05-29 ENCOUNTER — Ambulatory Visit: Payer: Medicare Other | Admitting: Family Medicine

## 2018-06-02 DIAGNOSIS — J9620 Acute and chronic respiratory failure, unspecified whether with hypoxia or hypercapnia: Secondary | ICD-10-CM | POA: Diagnosis not present

## 2018-06-02 DIAGNOSIS — J449 Chronic obstructive pulmonary disease, unspecified: Secondary | ICD-10-CM | POA: Diagnosis not present

## 2018-06-02 DIAGNOSIS — D381 Neoplasm of uncertain behavior of trachea, bronchus and lung: Secondary | ICD-10-CM | POA: Diagnosis not present

## 2018-06-02 DIAGNOSIS — I4891 Unspecified atrial fibrillation: Secondary | ICD-10-CM | POA: Diagnosis not present

## 2018-06-02 DIAGNOSIS — I10 Essential (primary) hypertension: Secondary | ICD-10-CM | POA: Diagnosis not present

## 2018-06-02 DIAGNOSIS — E46 Unspecified protein-calorie malnutrition: Secondary | ICD-10-CM | POA: Diagnosis not present

## 2018-06-03 ENCOUNTER — Ambulatory Visit: Payer: Medicare Other | Admitting: Cardiovascular Disease

## 2018-06-03 DIAGNOSIS — D381 Neoplasm of uncertain behavior of trachea, bronchus and lung: Secondary | ICD-10-CM | POA: Diagnosis not present

## 2018-06-03 DIAGNOSIS — I4891 Unspecified atrial fibrillation: Secondary | ICD-10-CM | POA: Diagnosis not present

## 2018-06-03 DIAGNOSIS — J9620 Acute and chronic respiratory failure, unspecified whether with hypoxia or hypercapnia: Secondary | ICD-10-CM | POA: Diagnosis not present

## 2018-06-03 DIAGNOSIS — E46 Unspecified protein-calorie malnutrition: Secondary | ICD-10-CM | POA: Diagnosis not present

## 2018-06-03 DIAGNOSIS — I10 Essential (primary) hypertension: Secondary | ICD-10-CM | POA: Diagnosis not present

## 2018-06-03 DIAGNOSIS — J449 Chronic obstructive pulmonary disease, unspecified: Secondary | ICD-10-CM | POA: Diagnosis not present

## 2018-06-08 DIAGNOSIS — J449 Chronic obstructive pulmonary disease, unspecified: Secondary | ICD-10-CM | POA: Diagnosis not present

## 2018-06-08 DIAGNOSIS — J9620 Acute and chronic respiratory failure, unspecified whether with hypoxia or hypercapnia: Secondary | ICD-10-CM | POA: Diagnosis not present

## 2018-06-08 DIAGNOSIS — D381 Neoplasm of uncertain behavior of trachea, bronchus and lung: Secondary | ICD-10-CM | POA: Diagnosis not present

## 2018-06-08 DIAGNOSIS — E46 Unspecified protein-calorie malnutrition: Secondary | ICD-10-CM | POA: Diagnosis not present

## 2018-06-08 DIAGNOSIS — I4891 Unspecified atrial fibrillation: Secondary | ICD-10-CM | POA: Diagnosis not present

## 2018-06-08 DIAGNOSIS — I10 Essential (primary) hypertension: Secondary | ICD-10-CM | POA: Diagnosis not present

## 2018-06-10 DIAGNOSIS — I10 Essential (primary) hypertension: Secondary | ICD-10-CM | POA: Diagnosis not present

## 2018-06-10 DIAGNOSIS — J449 Chronic obstructive pulmonary disease, unspecified: Secondary | ICD-10-CM | POA: Diagnosis not present

## 2018-06-10 DIAGNOSIS — E46 Unspecified protein-calorie malnutrition: Secondary | ICD-10-CM | POA: Diagnosis not present

## 2018-06-10 DIAGNOSIS — D381 Neoplasm of uncertain behavior of trachea, bronchus and lung: Secondary | ICD-10-CM | POA: Diagnosis not present

## 2018-06-10 DIAGNOSIS — I4891 Unspecified atrial fibrillation: Secondary | ICD-10-CM | POA: Diagnosis not present

## 2018-06-10 DIAGNOSIS — J9620 Acute and chronic respiratory failure, unspecified whether with hypoxia or hypercapnia: Secondary | ICD-10-CM | POA: Diagnosis not present

## 2018-06-11 ENCOUNTER — Other Ambulatory Visit: Payer: Self-pay

## 2018-06-11 DIAGNOSIS — G894 Chronic pain syndrome: Secondary | ICD-10-CM

## 2018-06-11 DIAGNOSIS — M5136 Other intervertebral disc degeneration, lumbar region: Secondary | ICD-10-CM

## 2018-06-11 NOTE — Telephone Encounter (Signed)
Pt called requesting RF for Amlodipine and Norco be sent to [facility].   Both RX have been pended.

## 2018-06-12 ENCOUNTER — Other Ambulatory Visit: Payer: Self-pay

## 2018-06-12 DIAGNOSIS — G894 Chronic pain syndrome: Secondary | ICD-10-CM

## 2018-06-12 DIAGNOSIS — M5136 Other intervertebral disc degeneration, lumbar region: Secondary | ICD-10-CM

## 2018-06-12 MED ORDER — AMLODIPINE BESYLATE 10 MG PO TABS: 10.0000 mg | ORAL_TABLET | Freq: Every day | ORAL | 1 refills | Status: AC

## 2018-06-12 MED ORDER — HYDROCODONE-ACETAMINOPHEN 10-325 MG PO TABS: 1.0000 | ORAL_TABLET | Freq: Four times a day (QID) | ORAL | 0 refills | Status: DC | PRN

## 2018-06-12 NOTE — Telephone Encounter (Signed)
Tamara Adams requests a refill on Hydrocodone and amlodipine. She is in Hospice care.   Also her husband Petina Muraski passed away the 10/12/2022 on June 14, 2023.

## 2018-06-12 NOTE — Telephone Encounter (Signed)
Med refills sent

## 2018-06-12 NOTE — Telephone Encounter (Signed)
Med sent. I will try to callher later.

## 2018-06-13 DIAGNOSIS — I4891 Unspecified atrial fibrillation: Secondary | ICD-10-CM | POA: Diagnosis not present

## 2018-06-13 DIAGNOSIS — J9620 Acute and chronic respiratory failure, unspecified whether with hypoxia or hypercapnia: Secondary | ICD-10-CM | POA: Diagnosis not present

## 2018-06-13 DIAGNOSIS — D381 Neoplasm of uncertain behavior of trachea, bronchus and lung: Secondary | ICD-10-CM | POA: Diagnosis not present

## 2018-06-13 DIAGNOSIS — J449 Chronic obstructive pulmonary disease, unspecified: Secondary | ICD-10-CM | POA: Diagnosis not present

## 2018-06-13 DIAGNOSIS — I10 Essential (primary) hypertension: Secondary | ICD-10-CM | POA: Diagnosis not present

## 2018-06-13 DIAGNOSIS — E46 Unspecified protein-calorie malnutrition: Secondary | ICD-10-CM | POA: Diagnosis not present

## 2018-06-17 ENCOUNTER — Other Ambulatory Visit: Payer: Self-pay

## 2018-06-17 ENCOUNTER — Other Ambulatory Visit: Payer: Medicare Other

## 2018-06-17 ENCOUNTER — Ambulatory Visit: Payer: Medicare Other | Admitting: Family

## 2018-06-17 DIAGNOSIS — I4891 Unspecified atrial fibrillation: Secondary | ICD-10-CM | POA: Diagnosis not present

## 2018-06-17 DIAGNOSIS — J449 Chronic obstructive pulmonary disease, unspecified: Secondary | ICD-10-CM | POA: Diagnosis not present

## 2018-06-17 DIAGNOSIS — E46 Unspecified protein-calorie malnutrition: Secondary | ICD-10-CM | POA: Diagnosis not present

## 2018-06-17 DIAGNOSIS — I10 Essential (primary) hypertension: Secondary | ICD-10-CM | POA: Diagnosis not present

## 2018-06-17 DIAGNOSIS — J9620 Acute and chronic respiratory failure, unspecified whether with hypoxia or hypercapnia: Secondary | ICD-10-CM | POA: Diagnosis not present

## 2018-06-17 DIAGNOSIS — D381 Neoplasm of uncertain behavior of trachea, bronchus and lung: Secondary | ICD-10-CM | POA: Diagnosis not present

## 2018-06-17 MED ORDER — PREDNISONE 20 MG PO TABS: 40.0000 mg | ORAL_TABLET | Freq: Every day | ORAL | 0 refills | Status: DC

## 2018-06-17 MED ORDER — DOXYCYCLINE HYCLATE 100 MG PO TABS: 100.0000 mg | ORAL_TABLET | Freq: Two times a day (BID) | ORAL | 0 refills | Status: DC

## 2018-06-17 NOTE — Telephone Encounter (Signed)
If she feels like it is probably a COPD exacerbation and then we can call in an antibiotic and some prednisone for the holiday.

## 2018-06-17 NOTE — Addendum Note (Signed)
Addended by: Beatrice Lecher D on: 06/17/2018 04:58 PM   Modules accepted: Orders

## 2018-06-17 NOTE — Addendum Note (Signed)
Addended by: Beatrice Lecher D on: 06/17/2018 05:05 PM   Modules accepted: Orders

## 2018-06-17 NOTE — Telephone Encounter (Signed)
Maura from Hospice called and I gave her OK to continue services and re-certify pt.  Cristela Blue did want to report pt has had some increased shortness of breath and has notices some delays/additional help needed around the house and with daily living.  FYI to PCP

## 2018-06-18 NOTE — Telephone Encounter (Signed)
Please see note from yesterday and call pt. Thank you.

## 2018-06-18 NOTE — Telephone Encounter (Signed)
Pt advised..Carmel Waddington Lynetta, CMA  

## 2018-06-22 DIAGNOSIS — I4891 Unspecified atrial fibrillation: Secondary | ICD-10-CM | POA: Diagnosis not present

## 2018-06-22 DIAGNOSIS — E46 Unspecified protein-calorie malnutrition: Secondary | ICD-10-CM | POA: Diagnosis not present

## 2018-06-22 DIAGNOSIS — K219 Gastro-esophageal reflux disease without esophagitis: Secondary | ICD-10-CM | POA: Diagnosis not present

## 2018-06-22 DIAGNOSIS — J449 Chronic obstructive pulmonary disease, unspecified: Secondary | ICD-10-CM | POA: Diagnosis not present

## 2018-06-22 DIAGNOSIS — J9620 Acute and chronic respiratory failure, unspecified whether with hypoxia or hypercapnia: Secondary | ICD-10-CM | POA: Diagnosis not present

## 2018-06-22 DIAGNOSIS — I10 Essential (primary) hypertension: Secondary | ICD-10-CM | POA: Diagnosis not present

## 2018-06-22 DIAGNOSIS — D381 Neoplasm of uncertain behavior of trachea, bronchus and lung: Secondary | ICD-10-CM | POA: Diagnosis not present

## 2018-06-24 DIAGNOSIS — E46 Unspecified protein-calorie malnutrition: Secondary | ICD-10-CM | POA: Diagnosis not present

## 2018-06-24 DIAGNOSIS — I10 Essential (primary) hypertension: Secondary | ICD-10-CM | POA: Diagnosis not present

## 2018-06-24 DIAGNOSIS — J9620 Acute and chronic respiratory failure, unspecified whether with hypoxia or hypercapnia: Secondary | ICD-10-CM | POA: Diagnosis not present

## 2018-06-24 DIAGNOSIS — J449 Chronic obstructive pulmonary disease, unspecified: Secondary | ICD-10-CM | POA: Diagnosis not present

## 2018-06-24 DIAGNOSIS — I4891 Unspecified atrial fibrillation: Secondary | ICD-10-CM | POA: Diagnosis not present

## 2018-06-24 DIAGNOSIS — D381 Neoplasm of uncertain behavior of trachea, bronchus and lung: Secondary | ICD-10-CM | POA: Diagnosis not present

## 2018-06-25 DIAGNOSIS — J9620 Acute and chronic respiratory failure, unspecified whether with hypoxia or hypercapnia: Secondary | ICD-10-CM | POA: Diagnosis not present

## 2018-06-25 DIAGNOSIS — E46 Unspecified protein-calorie malnutrition: Secondary | ICD-10-CM | POA: Diagnosis not present

## 2018-06-25 DIAGNOSIS — D381 Neoplasm of uncertain behavior of trachea, bronchus and lung: Secondary | ICD-10-CM | POA: Diagnosis not present

## 2018-06-25 DIAGNOSIS — J449 Chronic obstructive pulmonary disease, unspecified: Secondary | ICD-10-CM | POA: Diagnosis not present

## 2018-06-25 DIAGNOSIS — I10 Essential (primary) hypertension: Secondary | ICD-10-CM | POA: Diagnosis not present

## 2018-06-25 DIAGNOSIS — I4891 Unspecified atrial fibrillation: Secondary | ICD-10-CM | POA: Diagnosis not present

## 2018-06-26 ENCOUNTER — Telehealth: Payer: Self-pay

## 2018-06-26 DIAGNOSIS — C3490 Malignant neoplasm of unspecified part of unspecified bronchus or lung: Secondary | ICD-10-CM

## 2018-06-26 DIAGNOSIS — R5382 Chronic fatigue, unspecified: Secondary | ICD-10-CM

## 2018-06-26 DIAGNOSIS — R110 Nausea: Secondary | ICD-10-CM

## 2018-06-26 NOTE — Telephone Encounter (Signed)
Maura from Hospice called with pt update.   1. Pt feeling better after course of Doxy and increased Prednisone  2. Pt wanting to know if she can stay on higher dose maintenance prednisone  3. Maura from Hospice wanted to recommend to Dr Madilyn Fireman possibly changing pt from regular Albuterol Nebulizer to Duoneb   Note to Dr Madilyn Fireman

## 2018-06-27 DIAGNOSIS — I10 Essential (primary) hypertension: Secondary | ICD-10-CM | POA: Diagnosis not present

## 2018-06-27 DIAGNOSIS — E46 Unspecified protein-calorie malnutrition: Secondary | ICD-10-CM | POA: Diagnosis not present

## 2018-06-27 DIAGNOSIS — D381 Neoplasm of uncertain behavior of trachea, bronchus and lung: Secondary | ICD-10-CM | POA: Diagnosis not present

## 2018-06-27 DIAGNOSIS — J9620 Acute and chronic respiratory failure, unspecified whether with hypoxia or hypercapnia: Secondary | ICD-10-CM | POA: Diagnosis not present

## 2018-06-27 DIAGNOSIS — I4891 Unspecified atrial fibrillation: Secondary | ICD-10-CM | POA: Diagnosis not present

## 2018-06-27 DIAGNOSIS — J449 Chronic obstructive pulmonary disease, unspecified: Secondary | ICD-10-CM | POA: Diagnosis not present

## 2018-06-27 MED ORDER — PREDNISONE 10 MG PO TABS: 15.0000 mg | ORAL_TABLET | Freq: Every day | ORAL | 5 refills | Status: AC

## 2018-06-27 MED ORDER — IPRATROPIUM-ALBUTEROL 0.5-2.5 (3) MG/3ML IN SOLN: 3.0000 mL | Freq: Three times a day (TID) | RESPIRATORY_TRACT | 5 refills | Status: DC | PRN

## 2018-06-27 MED ORDER — PREDNISONE 10 MG PO TABS: 15.0000 mg | ORAL_TABLET | Freq: Every day | ORAL | 0 refills | Status: DC

## 2018-06-27 NOTE — Telephone Encounter (Signed)
Called maura and updated as above. Per Cristela Blue, pt can only have RX written for 15 day supply and it must go to local pharmacy. I have called Mail Order and cancelled Prednisone and re-sent for 15 day supply to [facility]. Duoneb RX sent for 15 days to [facility] also.

## 2018-06-27 NOTE — Telephone Encounter (Signed)
I' glad she is feeling much better.  We can increase her prednisone slighlty but she can't take 40mg  daily.  Let try 15mg  daily. I will send a new rx.  Ok to change the abluterol to Duoneb inhaled TID.  Ok to send new rx if needed.

## 2018-07-01 DIAGNOSIS — I10 Essential (primary) hypertension: Secondary | ICD-10-CM | POA: Diagnosis not present

## 2018-07-01 DIAGNOSIS — I4891 Unspecified atrial fibrillation: Secondary | ICD-10-CM | POA: Diagnosis not present

## 2018-07-01 DIAGNOSIS — J449 Chronic obstructive pulmonary disease, unspecified: Secondary | ICD-10-CM | POA: Diagnosis not present

## 2018-07-01 DIAGNOSIS — J9620 Acute and chronic respiratory failure, unspecified whether with hypoxia or hypercapnia: Secondary | ICD-10-CM | POA: Diagnosis not present

## 2018-07-01 DIAGNOSIS — D381 Neoplasm of uncertain behavior of trachea, bronchus and lung: Secondary | ICD-10-CM | POA: Diagnosis not present

## 2018-07-01 DIAGNOSIS — E46 Unspecified protein-calorie malnutrition: Secondary | ICD-10-CM | POA: Diagnosis not present

## 2018-07-02 DIAGNOSIS — J449 Chronic obstructive pulmonary disease, unspecified: Secondary | ICD-10-CM | POA: Diagnosis not present

## 2018-07-02 DIAGNOSIS — I4891 Unspecified atrial fibrillation: Secondary | ICD-10-CM | POA: Diagnosis not present

## 2018-07-02 DIAGNOSIS — D381 Neoplasm of uncertain behavior of trachea, bronchus and lung: Secondary | ICD-10-CM | POA: Diagnosis not present

## 2018-07-02 DIAGNOSIS — I10 Essential (primary) hypertension: Secondary | ICD-10-CM | POA: Diagnosis not present

## 2018-07-02 DIAGNOSIS — J9620 Acute and chronic respiratory failure, unspecified whether with hypoxia or hypercapnia: Secondary | ICD-10-CM | POA: Diagnosis not present

## 2018-07-02 DIAGNOSIS — E46 Unspecified protein-calorie malnutrition: Secondary | ICD-10-CM | POA: Diagnosis not present

## 2018-07-04 DIAGNOSIS — J9620 Acute and chronic respiratory failure, unspecified whether with hypoxia or hypercapnia: Secondary | ICD-10-CM | POA: Diagnosis not present

## 2018-07-04 DIAGNOSIS — E46 Unspecified protein-calorie malnutrition: Secondary | ICD-10-CM | POA: Diagnosis not present

## 2018-07-04 DIAGNOSIS — D381 Neoplasm of uncertain behavior of trachea, bronchus and lung: Secondary | ICD-10-CM | POA: Diagnosis not present

## 2018-07-04 DIAGNOSIS — J449 Chronic obstructive pulmonary disease, unspecified: Secondary | ICD-10-CM | POA: Diagnosis not present

## 2018-07-04 DIAGNOSIS — I4891 Unspecified atrial fibrillation: Secondary | ICD-10-CM | POA: Diagnosis not present

## 2018-07-04 DIAGNOSIS — I10 Essential (primary) hypertension: Secondary | ICD-10-CM | POA: Diagnosis not present

## 2018-07-08 ENCOUNTER — Telehealth: Payer: Self-pay

## 2018-07-08 DIAGNOSIS — I10 Essential (primary) hypertension: Secondary | ICD-10-CM | POA: Diagnosis not present

## 2018-07-08 DIAGNOSIS — J449 Chronic obstructive pulmonary disease, unspecified: Secondary | ICD-10-CM | POA: Diagnosis not present

## 2018-07-08 DIAGNOSIS — D381 Neoplasm of uncertain behavior of trachea, bronchus and lung: Secondary | ICD-10-CM | POA: Diagnosis not present

## 2018-07-08 DIAGNOSIS — E46 Unspecified protein-calorie malnutrition: Secondary | ICD-10-CM | POA: Diagnosis not present

## 2018-07-08 DIAGNOSIS — J9620 Acute and chronic respiratory failure, unspecified whether with hypoxia or hypercapnia: Secondary | ICD-10-CM | POA: Diagnosis not present

## 2018-07-08 DIAGNOSIS — I4891 Unspecified atrial fibrillation: Secondary | ICD-10-CM | POA: Diagnosis not present

## 2018-07-08 MED ORDER — IPRATROPIUM-ALBUTEROL 0.5-2.5 (3) MG/3ML IN SOLN: 3.0000 mL | RESPIRATORY_TRACT | 5 refills | Status: AC | PRN

## 2018-07-08 MED ORDER — AZITHROMYCIN 250 MG PO TABS: ORAL_TABLET | ORAL | 0 refills | Status: AC

## 2018-07-08 MED ORDER — PREDNISONE 20 MG PO TABS: 40.0000 mg | ORAL_TABLET | Freq: Every day | ORAL | 0 refills | Status: AC

## 2018-07-08 NOTE — Telephone Encounter (Signed)
Maura with Hospice called and reports patient is sick with a respiratory infection. She has wheezing with a productive cough and congestion. No fever, chills or sweats. She is requesting an antibiotic. Also a increase in the Duo neb to every 4-6 hours with a refill.    She was on doxycycline 2 weeks ago.

## 2018-07-08 NOTE — Telephone Encounter (Signed)
Azithromycin, antibiotic and prednisone as well as albuterol sent to pharmacy.

## 2018-07-09 NOTE — Telephone Encounter (Signed)
Maura and Pt advised.

## 2018-07-11 DIAGNOSIS — I4891 Unspecified atrial fibrillation: Secondary | ICD-10-CM | POA: Diagnosis not present

## 2018-07-11 DIAGNOSIS — J9620 Acute and chronic respiratory failure, unspecified whether with hypoxia or hypercapnia: Secondary | ICD-10-CM | POA: Diagnosis not present

## 2018-07-11 DIAGNOSIS — D381 Neoplasm of uncertain behavior of trachea, bronchus and lung: Secondary | ICD-10-CM | POA: Diagnosis not present

## 2018-07-11 DIAGNOSIS — J449 Chronic obstructive pulmonary disease, unspecified: Secondary | ICD-10-CM | POA: Diagnosis not present

## 2018-07-11 DIAGNOSIS — I10 Essential (primary) hypertension: Secondary | ICD-10-CM | POA: Diagnosis not present

## 2018-07-11 DIAGNOSIS — E46 Unspecified protein-calorie malnutrition: Secondary | ICD-10-CM | POA: Diagnosis not present

## 2018-07-15 DIAGNOSIS — I4891 Unspecified atrial fibrillation: Secondary | ICD-10-CM | POA: Diagnosis not present

## 2018-07-15 DIAGNOSIS — I10 Essential (primary) hypertension: Secondary | ICD-10-CM | POA: Diagnosis not present

## 2018-07-15 DIAGNOSIS — D381 Neoplasm of uncertain behavior of trachea, bronchus and lung: Secondary | ICD-10-CM | POA: Diagnosis not present

## 2018-07-15 DIAGNOSIS — E46 Unspecified protein-calorie malnutrition: Secondary | ICD-10-CM | POA: Diagnosis not present

## 2018-07-15 DIAGNOSIS — J449 Chronic obstructive pulmonary disease, unspecified: Secondary | ICD-10-CM | POA: Diagnosis not present

## 2018-07-15 DIAGNOSIS — J9620 Acute and chronic respiratory failure, unspecified whether with hypoxia or hypercapnia: Secondary | ICD-10-CM | POA: Diagnosis not present

## 2018-07-20 DIAGNOSIS — D381 Neoplasm of uncertain behavior of trachea, bronchus and lung: Secondary | ICD-10-CM | POA: Diagnosis not present

## 2018-07-20 DIAGNOSIS — I4891 Unspecified atrial fibrillation: Secondary | ICD-10-CM | POA: Diagnosis not present

## 2018-07-20 DIAGNOSIS — J9620 Acute and chronic respiratory failure, unspecified whether with hypoxia or hypercapnia: Secondary | ICD-10-CM | POA: Diagnosis not present

## 2018-07-20 DIAGNOSIS — E46 Unspecified protein-calorie malnutrition: Secondary | ICD-10-CM | POA: Diagnosis not present

## 2018-07-20 DIAGNOSIS — J449 Chronic obstructive pulmonary disease, unspecified: Secondary | ICD-10-CM | POA: Diagnosis not present

## 2018-07-20 DIAGNOSIS — I10 Essential (primary) hypertension: Secondary | ICD-10-CM | POA: Diagnosis not present

## 2018-07-21 ENCOUNTER — Telehealth: Payer: Self-pay

## 2018-07-21 DIAGNOSIS — R062 Wheezing: Secondary | ICD-10-CM | POA: Diagnosis not present

## 2018-07-21 DIAGNOSIS — R0902 Hypoxemia: Secondary | ICD-10-CM | POA: Diagnosis not present

## 2018-07-21 DIAGNOSIS — R509 Fever, unspecified: Secondary | ICD-10-CM | POA: Diagnosis not present

## 2018-07-21 DIAGNOSIS — J439 Emphysema, unspecified: Secondary | ICD-10-CM | POA: Diagnosis not present

## 2018-07-21 DIAGNOSIS — E46 Unspecified protein-calorie malnutrition: Secondary | ICD-10-CM | POA: Diagnosis not present

## 2018-07-21 DIAGNOSIS — R0602 Shortness of breath: Secondary | ICD-10-CM | POA: Diagnosis not present

## 2018-07-21 DIAGNOSIS — E860 Dehydration: Secondary | ICD-10-CM | POA: Diagnosis not present

## 2018-07-21 DIAGNOSIS — D381 Neoplasm of uncertain behavior of trachea, bronchus and lung: Secondary | ICD-10-CM | POA: Diagnosis not present

## 2018-07-21 DIAGNOSIS — I4891 Unspecified atrial fibrillation: Secondary | ICD-10-CM | POA: Diagnosis not present

## 2018-07-21 DIAGNOSIS — J449 Chronic obstructive pulmonary disease, unspecified: Secondary | ICD-10-CM | POA: Diagnosis not present

## 2018-07-21 DIAGNOSIS — R918 Other nonspecific abnormal finding of lung field: Secondary | ICD-10-CM | POA: Diagnosis not present

## 2018-07-21 DIAGNOSIS — R197 Diarrhea, unspecified: Secondary | ICD-10-CM | POA: Diagnosis not present

## 2018-07-21 DIAGNOSIS — R590 Localized enlarged lymph nodes: Secondary | ICD-10-CM | POA: Diagnosis not present

## 2018-07-21 DIAGNOSIS — I959 Hypotension, unspecified: Secondary | ICD-10-CM | POA: Diagnosis not present

## 2018-07-21 DIAGNOSIS — I10 Essential (primary) hypertension: Secondary | ICD-10-CM | POA: Diagnosis not present

## 2018-07-21 DIAGNOSIS — J9620 Acute and chronic respiratory failure, unspecified whether with hypoxia or hypercapnia: Secondary | ICD-10-CM | POA: Diagnosis not present

## 2018-07-21 DIAGNOSIS — J9611 Chronic respiratory failure with hypoxia: Secondary | ICD-10-CM | POA: Diagnosis not present

## 2018-07-21 DIAGNOSIS — E871 Hypo-osmolality and hyponatremia: Secondary | ICD-10-CM | POA: Diagnosis not present

## 2018-07-21 DIAGNOSIS — J441 Chronic obstructive pulmonary disease with (acute) exacerbation: Secondary | ICD-10-CM | POA: Diagnosis not present

## 2018-07-21 MED ORDER — AZITHROMYCIN 250 MG PO TABS: ORAL_TABLET | ORAL | 0 refills | Status: AC

## 2018-07-21 NOTE — Telephone Encounter (Signed)
Patient advised.

## 2018-07-21 NOTE — Telephone Encounter (Signed)
New rx sent to [facility]

## 2018-07-21 NOTE — Telephone Encounter (Signed)
Tamara Adams called and states she is still sick with a cough and wheezing. She would like another antibiotic. Please advise.

## 2018-07-22 ENCOUNTER — Telehealth: Payer: Self-pay

## 2018-07-22 DIAGNOSIS — Z7189 Other specified counseling: Secondary | ICD-10-CM | POA: Diagnosis not present

## 2018-07-22 DIAGNOSIS — C3490 Malignant neoplasm of unspecified part of unspecified bronchus or lung: Secondary | ICD-10-CM | POA: Diagnosis not present

## 2018-07-22 DIAGNOSIS — J449 Chronic obstructive pulmonary disease, unspecified: Secondary | ICD-10-CM | POA: Diagnosis not present

## 2018-07-22 DIAGNOSIS — J9620 Acute and chronic respiratory failure, unspecified whether with hypoxia or hypercapnia: Secondary | ICD-10-CM | POA: Diagnosis not present

## 2018-07-22 DIAGNOSIS — R918 Other nonspecific abnormal finding of lung field: Secondary | ICD-10-CM | POA: Diagnosis not present

## 2018-07-22 DIAGNOSIS — R110 Nausea: Secondary | ICD-10-CM | POA: Diagnosis not present

## 2018-07-22 DIAGNOSIS — J9611 Chronic respiratory failure with hypoxia: Secondary | ICD-10-CM | POA: Diagnosis not present

## 2018-07-22 DIAGNOSIS — R0902 Hypoxemia: Secondary | ICD-10-CM | POA: Diagnosis not present

## 2018-07-22 DIAGNOSIS — E46 Unspecified protein-calorie malnutrition: Secondary | ICD-10-CM | POA: Diagnosis not present

## 2018-07-22 DIAGNOSIS — I4891 Unspecified atrial fibrillation: Secondary | ICD-10-CM | POA: Diagnosis not present

## 2018-07-22 DIAGNOSIS — E871 Hypo-osmolality and hyponatremia: Secondary | ICD-10-CM | POA: Diagnosis not present

## 2018-07-22 DIAGNOSIS — R0600 Dyspnea, unspecified: Secondary | ICD-10-CM | POA: Diagnosis not present

## 2018-07-22 DIAGNOSIS — J441 Chronic obstructive pulmonary disease with (acute) exacerbation: Secondary | ICD-10-CM | POA: Diagnosis not present

## 2018-07-22 DIAGNOSIS — J439 Emphysema, unspecified: Secondary | ICD-10-CM | POA: Diagnosis not present

## 2018-07-22 DIAGNOSIS — J189 Pneumonia, unspecified organism: Secondary | ICD-10-CM | POA: Diagnosis not present

## 2018-07-22 DIAGNOSIS — R590 Localized enlarged lymph nodes: Secondary | ICD-10-CM | POA: Diagnosis not present

## 2018-07-22 DIAGNOSIS — E860 Dehydration: Secondary | ICD-10-CM | POA: Diagnosis not present

## 2018-07-22 DIAGNOSIS — D381 Neoplasm of uncertain behavior of trachea, bronchus and lung: Secondary | ICD-10-CM | POA: Diagnosis not present

## 2018-07-22 DIAGNOSIS — I10 Essential (primary) hypertension: Secondary | ICD-10-CM | POA: Diagnosis not present

## 2018-07-22 DIAGNOSIS — Z515 Encounter for palliative care: Secondary | ICD-10-CM | POA: Diagnosis not present

## 2018-07-22 NOTE — Telephone Encounter (Signed)
[name] with Hospice called and states she listened to Mrs Tamara Adams lungs today and believes she is worse than expected. She sent her to the ED.

## 2018-07-23 DIAGNOSIS — D381 Neoplasm of uncertain behavior of trachea, bronchus and lung: Secondary | ICD-10-CM | POA: Diagnosis not present

## 2018-07-23 DIAGNOSIS — E46 Unspecified protein-calorie malnutrition: Secondary | ICD-10-CM | POA: Diagnosis not present

## 2018-07-23 DIAGNOSIS — E860 Dehydration: Secondary | ICD-10-CM | POA: Diagnosis not present

## 2018-07-23 DIAGNOSIS — I10 Essential (primary) hypertension: Secondary | ICD-10-CM | POA: Diagnosis not present

## 2018-07-23 DIAGNOSIS — J189 Pneumonia, unspecified organism: Secondary | ICD-10-CM | POA: Diagnosis not present

## 2018-07-23 DIAGNOSIS — J9611 Chronic respiratory failure with hypoxia: Secondary | ICD-10-CM | POA: Diagnosis not present

## 2018-07-23 DIAGNOSIS — J9620 Acute and chronic respiratory failure, unspecified whether with hypoxia or hypercapnia: Secondary | ICD-10-CM | POA: Diagnosis not present

## 2018-07-23 DIAGNOSIS — R0602 Shortness of breath: Secondary | ICD-10-CM | POA: Diagnosis not present

## 2018-07-23 DIAGNOSIS — I4891 Unspecified atrial fibrillation: Secondary | ICD-10-CM | POA: Diagnosis not present

## 2018-07-23 DIAGNOSIS — J449 Chronic obstructive pulmonary disease, unspecified: Secondary | ICD-10-CM | POA: Diagnosis not present

## 2018-07-23 DIAGNOSIS — J441 Chronic obstructive pulmonary disease with (acute) exacerbation: Secondary | ICD-10-CM | POA: Diagnosis not present

## 2018-07-23 DIAGNOSIS — R918 Other nonspecific abnormal finding of lung field: Secondary | ICD-10-CM | POA: Diagnosis not present

## 2018-07-23 DIAGNOSIS — K219 Gastro-esophageal reflux disease without esophagitis: Secondary | ICD-10-CM | POA: Diagnosis not present

## 2018-07-23 DIAGNOSIS — C3490 Malignant neoplasm of unspecified part of unspecified bronchus or lung: Secondary | ICD-10-CM | POA: Diagnosis not present

## 2018-07-23 DIAGNOSIS — J90 Pleural effusion, not elsewhere classified: Secondary | ICD-10-CM | POA: Diagnosis not present

## 2018-07-24 ENCOUNTER — Other Ambulatory Visit: Payer: Self-pay

## 2018-07-24 DIAGNOSIS — R110 Nausea: Secondary | ICD-10-CM | POA: Diagnosis not present

## 2018-07-24 DIAGNOSIS — J441 Chronic obstructive pulmonary disease with (acute) exacerbation: Secondary | ICD-10-CM | POA: Diagnosis not present

## 2018-07-24 DIAGNOSIS — Z7189 Other specified counseling: Secondary | ICD-10-CM | POA: Diagnosis not present

## 2018-07-24 DIAGNOSIS — M5136 Other intervertebral disc degeneration, lumbar region: Secondary | ICD-10-CM

## 2018-07-24 DIAGNOSIS — E871 Hypo-osmolality and hyponatremia: Secondary | ICD-10-CM | POA: Diagnosis not present

## 2018-07-24 DIAGNOSIS — K5900 Constipation, unspecified: Secondary | ICD-10-CM | POA: Diagnosis not present

## 2018-07-24 DIAGNOSIS — J9611 Chronic respiratory failure with hypoxia: Secondary | ICD-10-CM | POA: Diagnosis not present

## 2018-07-24 DIAGNOSIS — E860 Dehydration: Secondary | ICD-10-CM | POA: Diagnosis not present

## 2018-07-24 DIAGNOSIS — G894 Chronic pain syndrome: Secondary | ICD-10-CM

## 2018-07-24 DIAGNOSIS — R0600 Dyspnea, unspecified: Secondary | ICD-10-CM | POA: Diagnosis not present

## 2018-07-24 DIAGNOSIS — C3490 Malignant neoplasm of unspecified part of unspecified bronchus or lung: Secondary | ICD-10-CM | POA: Diagnosis not present

## 2018-07-24 DIAGNOSIS — Z515 Encounter for palliative care: Secondary | ICD-10-CM | POA: Diagnosis not present

## 2018-07-24 DIAGNOSIS — R52 Pain, unspecified: Secondary | ICD-10-CM | POA: Diagnosis not present

## 2018-07-24 MED ORDER — HYDROCODONE-ACETAMINOPHEN 10-325 MG PO TABS: 1.0000 | ORAL_TABLET | Freq: Four times a day (QID) | ORAL | 0 refills | Status: AC | PRN

## 2018-07-24 NOTE — Telephone Encounter (Signed)
Request for a refill on Hydrocodone.

## 2018-07-28 DIAGNOSIS — R0902 Hypoxemia: Secondary | ICD-10-CM | POA: Diagnosis not present

## 2018-07-28 DIAGNOSIS — J90 Pleural effusion, not elsewhere classified: Secondary | ICD-10-CM | POA: Diagnosis not present

## 2018-07-28 DIAGNOSIS — R0689 Other abnormalities of breathing: Secondary | ICD-10-CM | POA: Diagnosis not present

## 2018-07-28 DIAGNOSIS — R000 Tachycardia, unspecified: Secondary | ICD-10-CM | POA: Diagnosis not present

## 2018-07-28 DIAGNOSIS — J189 Pneumonia, unspecified organism: Secondary | ICD-10-CM | POA: Diagnosis not present

## 2018-07-28 DIAGNOSIS — R918 Other nonspecific abnormal finding of lung field: Secondary | ICD-10-CM | POA: Diagnosis not present

## 2018-07-28 DIAGNOSIS — R0602 Shortness of breath: Secondary | ICD-10-CM | POA: Diagnosis not present

## 2018-08-04 ENCOUNTER — Telehealth: Payer: Self-pay

## 2018-08-04 NOTE — Telephone Encounter (Signed)
Tamara Adams with Hospice called and states Tamara Adams is declining fast. They are moving her to [facility] for end of life care.

## 2018-08-08 NOTE — Telephone Encounter (Signed)
Also called and spoke with her son Herbie [place].  He is going to visit her today.  He seems at peace with things.  Unfortunately her lung cancer has progressed and that is part of her decline.

## 2018-08-08 NOTE — Telephone Encounter (Signed)
Called and spoke with Tamara Adams she was able to be transferred to [facility] Monday.  She did check on her in Tuesday and she seemed comfortable.  I then called [facility] [phone] and spoke with the nurse there.  Yesterday they started Ativan to help with her anxiety as well as methadone to help with her pain as she was complaining of a lot of discomfort yesterday.  They are trying to get her medication regulated to get her more comfortable.  She was currently sleeping so was unable to speak to her directly.  We will try to call her later again.

## 2018-08-12 ENCOUNTER — Encounter: Payer: Self-pay | Admitting: Family

## 2018-08-23 DEATH — deceased

## 2019-06-20 IMAGING — CT CT OUTSIDE FILMS CHEST
1 of 3 series · 16 of 31 positions shown, 20 images · IV contrast (ISOVUE 370)
Comparison: none

[Series 6: sagittal b40f · axial · 0.65mm/px · z∈[-304,+18]mm · 16 of 518 slices shown, 20 images]
[im 29/518  mediastinal]
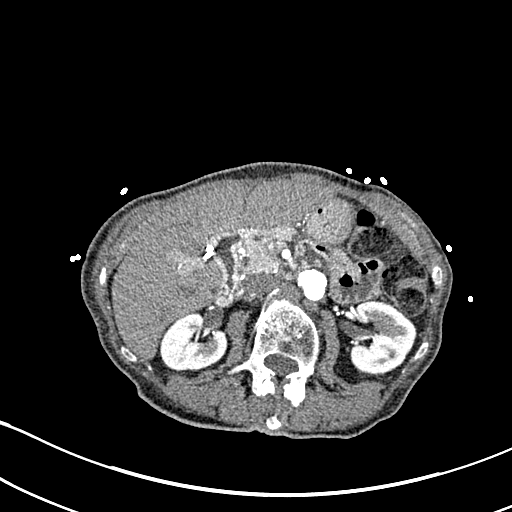
[im 29/518  lung]
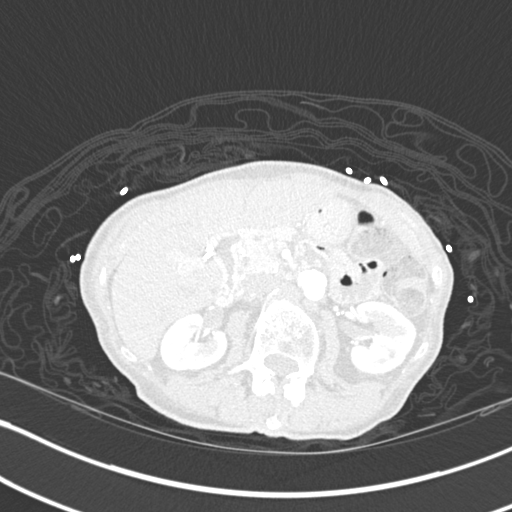
[im 58/518  lung]
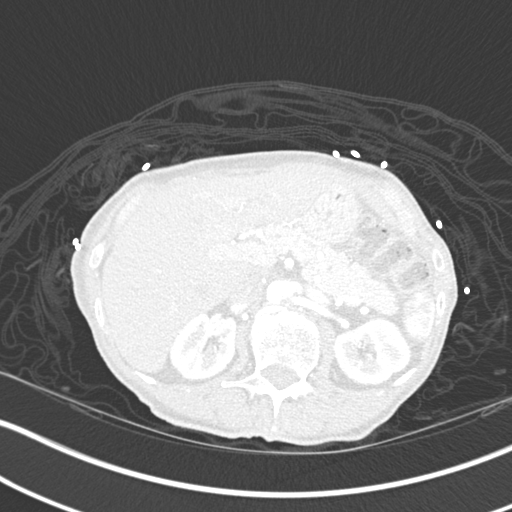
[im 87/518  lung]
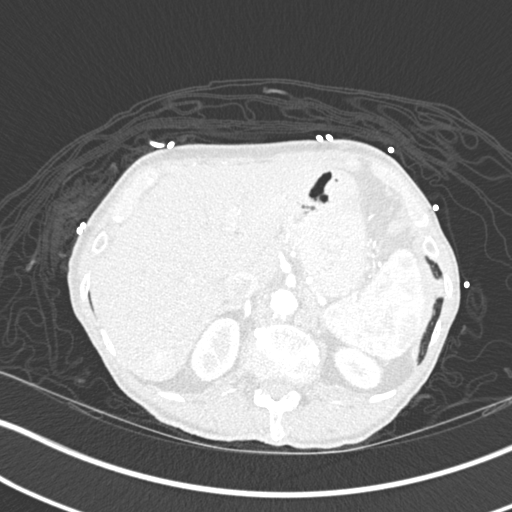
[im 115/518  lung]
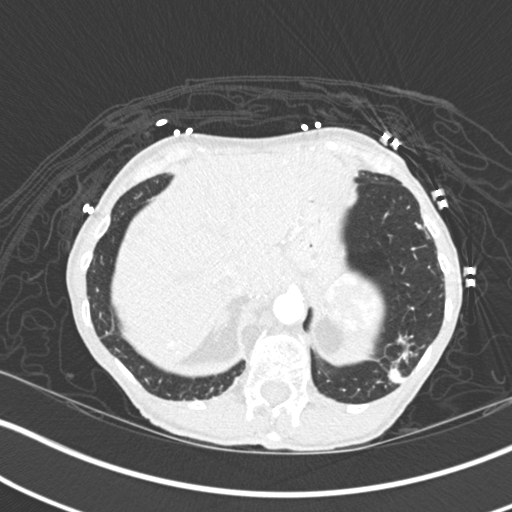
[im 173/518  mediastinal]
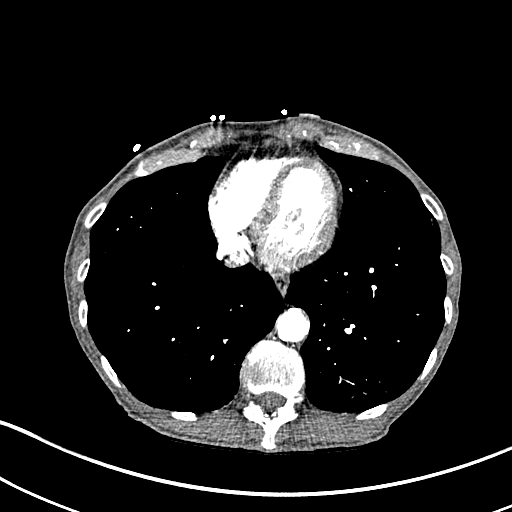
[im 173/518  lung]
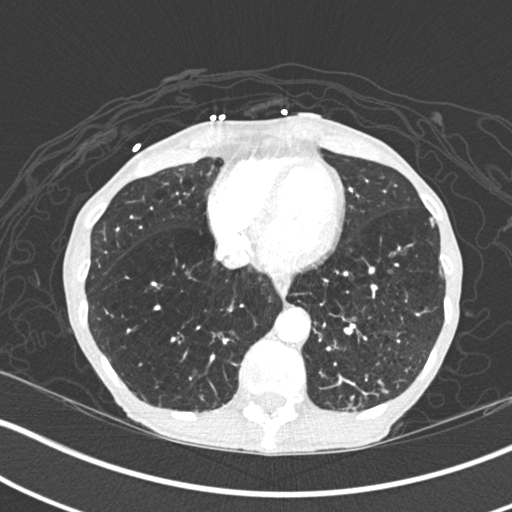
[im 202/518  lung]
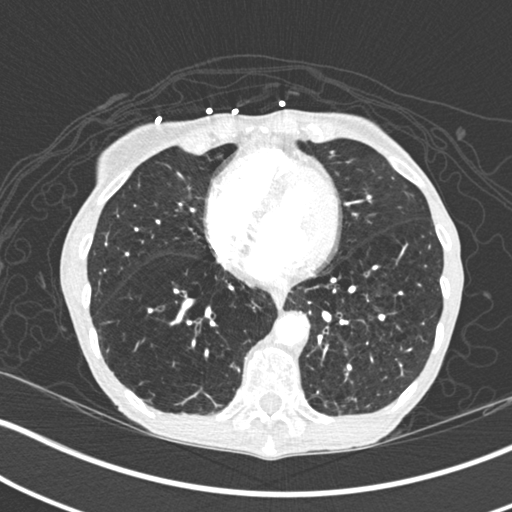
[im 230/518  lung]
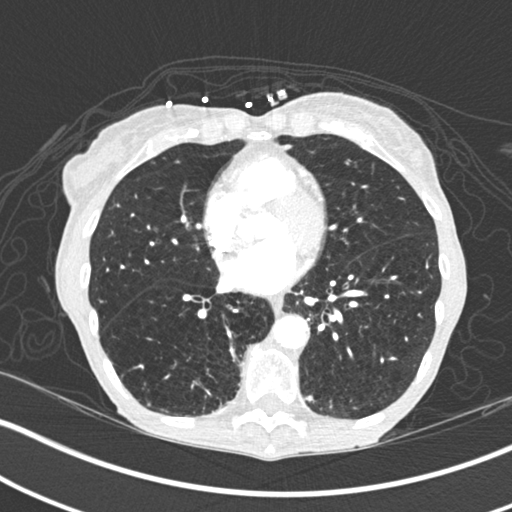
[im 247/518  lung]
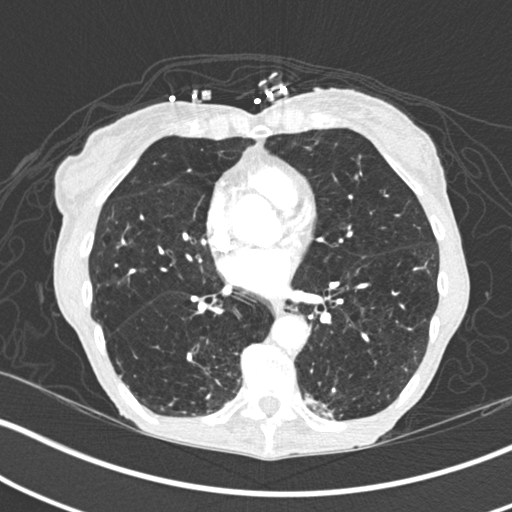
[im 259/518  mediastinal]
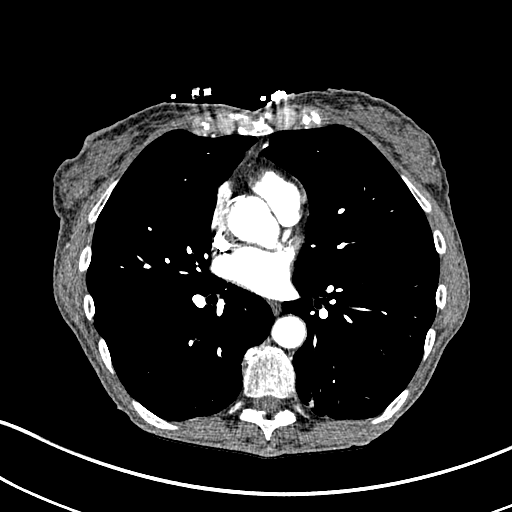
[im 259/518  lung]
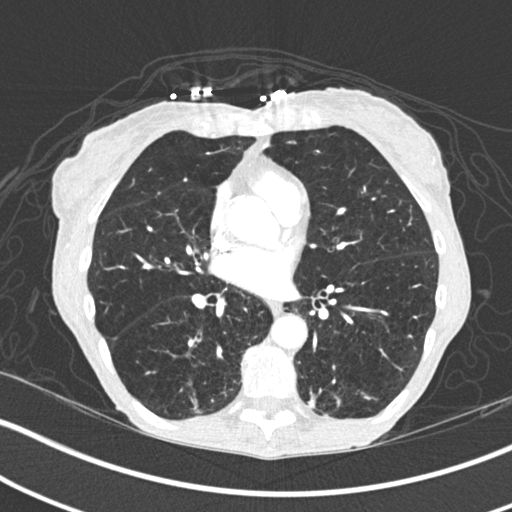
[im 288/518  lung]
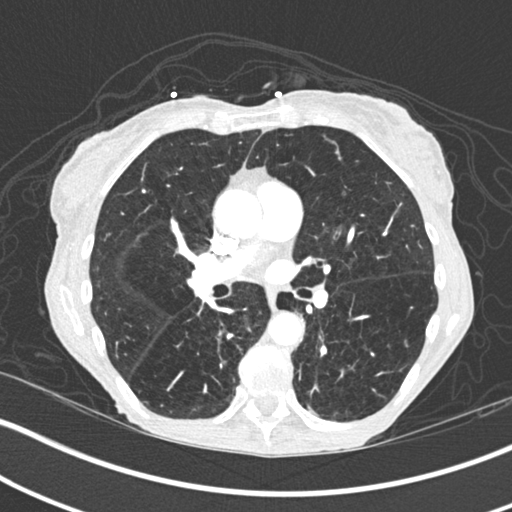
[im 316/518  lung]
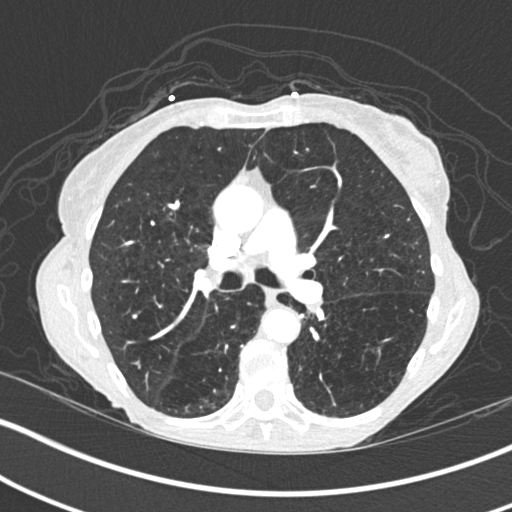
[im 345/518  lung]
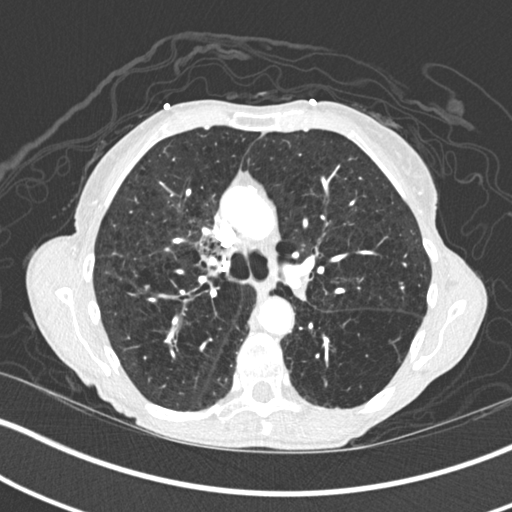
[im 403/518  mediastinal]
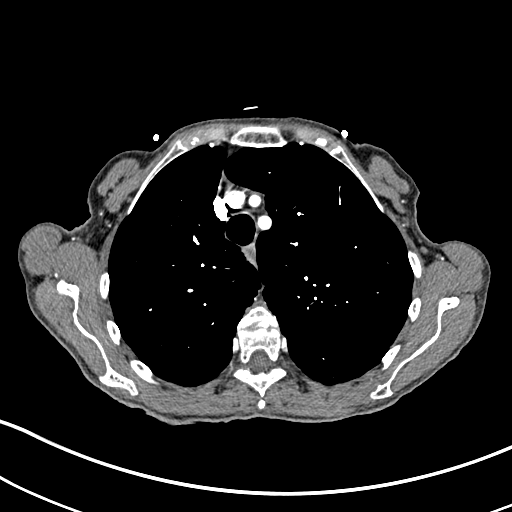
[im 403/518  lung]
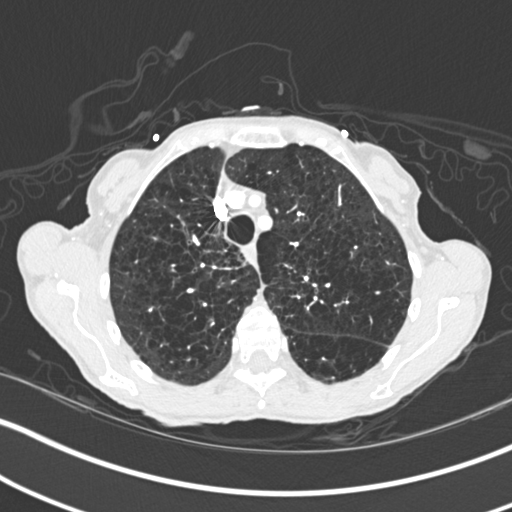
[im 431/518  lung]
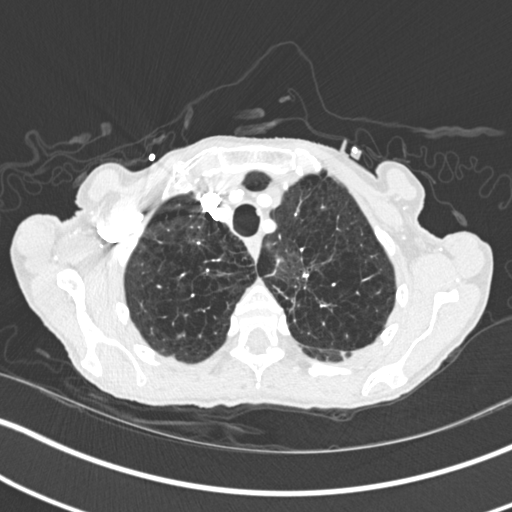
[im 460/518  lung]
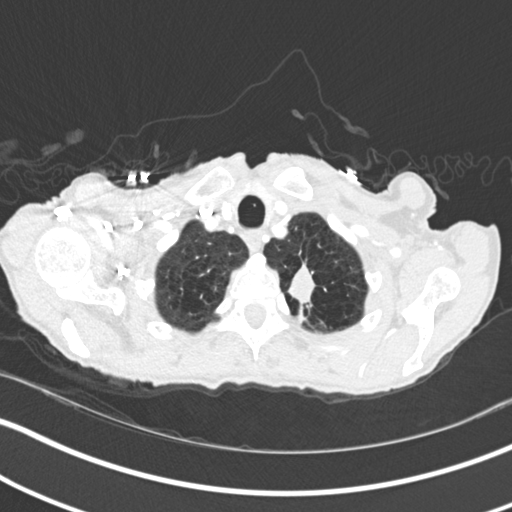
[im 489/518  lung]
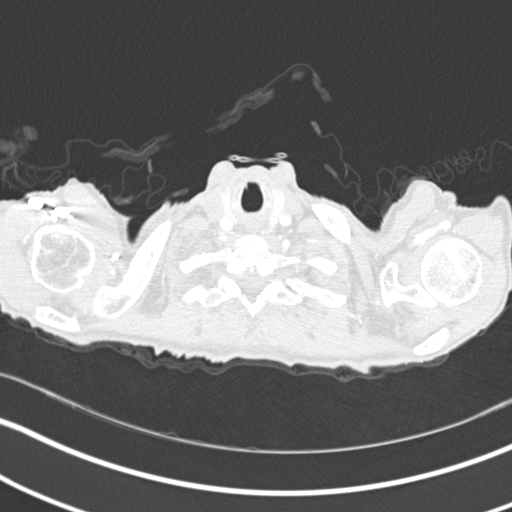

[16 of 31 positions shown; findings below may reference images not displayed]

Canned report from images found in remote index.

Refer to host system for actual result text.
